# Patient Record
Sex: Female | Born: 1945 | Race: Black or African American | Hispanic: No | Marital: Married | Smoking: Never smoker
Health system: Southern US, Community
[De-identification: ages and names within clinical notes are randomized; demographics above are authoritative.]

## PROBLEM LIST (undated history)

## (undated) DIAGNOSIS — I313 Pericardial effusion (noninflammatory): Secondary | ICD-10-CM

## (undated) DIAGNOSIS — I428 Other cardiomyopathies: Secondary | ICD-10-CM

## (undated) DIAGNOSIS — I5022 Chronic systolic (congestive) heart failure: Secondary | ICD-10-CM

## (undated) DIAGNOSIS — D649 Anemia, unspecified: Secondary | ICD-10-CM

## (undated) DIAGNOSIS — Z5189 Encounter for other specified aftercare: Secondary | ICD-10-CM

## (undated) DIAGNOSIS — Z9119 Patient's noncompliance with other medical treatment and regimen: Secondary | ICD-10-CM

## (undated) DIAGNOSIS — Z91199 Patient's noncompliance with other medical treatment and regimen due to unspecified reason: Secondary | ICD-10-CM

## (undated) DIAGNOSIS — R7881 Bacteremia: Secondary | ICD-10-CM

## (undated) DIAGNOSIS — R55 Syncope and collapse: Secondary | ICD-10-CM

## (undated) DIAGNOSIS — I513 Intracardiac thrombosis, not elsewhere classified: Secondary | ICD-10-CM

## (undated) DIAGNOSIS — I480 Paroxysmal atrial fibrillation: Secondary | ICD-10-CM

## (undated) DIAGNOSIS — I10 Essential (primary) hypertension: Secondary | ICD-10-CM

## (undated) DIAGNOSIS — I3139 Other pericardial effusion (noninflammatory): Secondary | ICD-10-CM

## (undated) DIAGNOSIS — M4640 Discitis, unspecified, site unspecified: Secondary | ICD-10-CM

## (undated) HISTORY — DX: Encounter for other specified aftercare: Z51.89

---

## 1997-10-01 ENCOUNTER — Emergency Department (HOSPITAL_COMMUNITY): Admission: EM | Admit: 1997-10-01 | Discharge: 1997-10-01 | Payer: Self-pay | Admitting: Emergency Medicine

## 1998-05-06 ENCOUNTER — Inpatient Hospital Stay (HOSPITAL_COMMUNITY): Admission: EM | Admit: 1998-05-06 | Discharge: 1998-05-10 | Payer: Self-pay | Admitting: Emergency Medicine

## 1998-05-09 ENCOUNTER — Encounter: Payer: Self-pay | Admitting: *Deleted

## 1998-05-25 ENCOUNTER — Other Ambulatory Visit: Admission: RE | Admit: 1998-05-25 | Discharge: 1998-05-25 | Payer: Self-pay | Admitting: Obstetrics

## 1998-06-09 ENCOUNTER — Ambulatory Visit (HOSPITAL_COMMUNITY): Admission: RE | Admit: 1998-06-09 | Discharge: 1998-06-09 | Payer: Self-pay | Admitting: Obstetrics

## 1998-10-03 ENCOUNTER — Other Ambulatory Visit: Admission: RE | Admit: 1998-10-03 | Discharge: 1998-10-03 | Payer: Self-pay | Admitting: Obstetrics

## 2000-10-04 ENCOUNTER — Emergency Department (HOSPITAL_COMMUNITY): Admission: EM | Admit: 2000-10-04 | Discharge: 2000-10-04 | Payer: Self-pay

## 2005-04-17 ENCOUNTER — Emergency Department (HOSPITAL_COMMUNITY): Admission: EM | Admit: 2005-04-17 | Discharge: 2005-04-17 | Payer: Self-pay | Admitting: Emergency Medicine

## 2006-04-02 ENCOUNTER — Ambulatory Visit: Payer: Self-pay | Admitting: Internal Medicine

## 2011-02-16 ENCOUNTER — Encounter: Payer: Self-pay | Admitting: Emergency Medicine

## 2011-02-16 ENCOUNTER — Emergency Department (HOSPITAL_COMMUNITY)
Admission: EM | Admit: 2011-02-16 | Discharge: 2011-02-17 | Disposition: A | Discharge status: Home / Self Care | Attending: Emergency Medicine | Admitting: Emergency Medicine

## 2011-02-16 DIAGNOSIS — L2989 Other pruritus: Secondary | ICD-10-CM | POA: Insufficient documentation

## 2011-02-16 DIAGNOSIS — M79609 Pain in unspecified limb: Secondary | ICD-10-CM | POA: Insufficient documentation

## 2011-02-16 DIAGNOSIS — R609 Edema, unspecified: Secondary | ICD-10-CM | POA: Insufficient documentation

## 2011-02-16 DIAGNOSIS — R209 Unspecified disturbances of skin sensation: Secondary | ICD-10-CM | POA: Insufficient documentation

## 2011-02-16 DIAGNOSIS — L298 Other pruritus: Secondary | ICD-10-CM | POA: Insufficient documentation

## 2011-02-16 DIAGNOSIS — R21 Rash and other nonspecific skin eruption: Secondary | ICD-10-CM | POA: Insufficient documentation

## 2011-02-16 DIAGNOSIS — I10 Essential (primary) hypertension: Secondary | ICD-10-CM | POA: Insufficient documentation

## 2011-02-16 DIAGNOSIS — Z86718 Personal history of other venous thrombosis and embolism: Secondary | ICD-10-CM | POA: Insufficient documentation

## 2011-02-16 HISTORY — DX: Essential (primary) hypertension: I10

## 2011-02-16 HISTORY — DX: Anemia, unspecified: D64.9

## 2011-02-16 MED ORDER — DIPHENHYDRAMINE HCL 50 MG/ML IJ SOLN: 25.0000 mg | Freq: Once | INTRAMUSCULAR | Status: DC

## 2011-02-16 NOTE — ED Notes (Signed)
PT. REPORTS BILATERAL ANKLE AND LOWER LEG  SWELLING WITH PAIN AND TINGLING X 1 MONTH , DENIES INJJURY OR FALL , NO FEVER OR CHILLS.

## 2011-02-16 NOTE — ED Provider Notes (Signed)
History     CSN: [id number] Arrival date & time: 02/16/2011  8:09 PM   First MD Initiated Contact with Patient 02/16/11 2200      Chief Complaint  Patient presents with  . Joint Swelling    (Consider location/radiation/quality/duration/timing/severity/associated sxs/prior treatment) HPI History provided by pt.   Patient referred by [facility] today for bilateral LE pain, edema, pruritis and tingling x 4-5 months.  Legs are very irritated when she steps out of shower and cool air hits them.  Sx have gradually worsened and are worse on L.  In the past 2 weeks, her skin color has changed as well.  Occasionally weeps clear fluid.  Has had similar skin changes on arms but this has cleared up.  Denies fever.  Denies trauma.  Denies CP/SOB.  No prior cardiac history.  H/o DVT at age 65 during pregnancy.  Pt reports that current sx are different.  H/o mild psoriasis.   Past Medical History  Diagnosis Date  . Anemia   . Hypertension     History reviewed. No pertinent past surgical history.  No family history on file.  History  Substance Use Topics  . Smoking status: Never Smoker   . Smokeless tobacco: Not on file  . Alcohol Use: No    OB History    Grav Para Term Preterm Abortions TAB SAB Ect Mult Living                  Review of Systems  All other systems reviewed and are negative.    Allergies  Review of patient's allergies indicates no known allergies.  Home Medications   Current Outpatient Rx  Name Route Sig Dispense Refill  . FERROUS SULFATE 325 (65 FE) MG PO TABS Oral Take 325 mg by mouth daily with breakfast.        BP 170/97  Pulse 80  Temp(Src) 98 F (36.7 C) (Oral)  Resp 18  SpO2 100%  Physical Exam  Nursing note and vitals reviewed. Constitutional: She is oriented to person, place, and time. She appears well-developed and well-nourished. No distress.  HENT:  Head: Normocephalic and atraumatic.       Nml buccal mucosa  Eyes:       Normal  appearance  Neck: Normal range of motion.  Cardiovascular: Normal rate and regular rhythm.   Pulmonary/Chest: Effort normal and breath sounds normal.  Musculoskeletal:       Mild non-pitting edema bilateral LE; slightly worse on left.  Full ROM w/out pain.  2+ radial pulses and distal sensation intact.  Ambulates normally.  Neurological: She is alert and oriented to person, place, and time.  Skin: Skin is warm and dry.       Bilateral LE w/ diffuse, circumferential erythema overlayed w/ hyperpigmented and silvery scales.  Extends into left dorsal foot.  No drainage.  Skin feels leathery.  Tissues are soft.  Mild tenderness bilateral calves only.  Small areas of hyperpigmented skin on arms, back and abd as well.  Palms and soles spared.  Psychiatric: She has a normal mood and affect. Her behavior is normal.    ED Course  Procedures (including critical care time)  Labs Reviewed  BASIC METABOLIC PANEL - Abnormal; Notable for the following:    Glucose, Bld 108 (*)    All other components within normal limits  CBC  LAB REPORT - SCANNED   No results found.   1. Rash       MDM  Pt presents w/  painful/pruritis rash of bilateral LE for the past 4-5 weeks.  Dr. Rubin Payor and Dierdre Highman have both evaluated patient and it appears to be a severe case of psoriasis.  Pt has a prior history.  Discharged home w/ prednisone and referral to [facility].  Recommended cool compresses and benadryl and advised against scratching.  Return precautions discussed.        Otilio Miu, [place] 02/17/11 1143

## 2011-02-17 LAB — BASIC METABOLIC PANEL
BUN: 9 mg/dL (ref 6–23)
CO2: 25 mEq/L (ref 19–32)
Calcium: 9.8 mg/dL (ref 8.4–10.5)
Chloride: 102 mEq/L (ref 96–112)
Creatinine, Ser: 0.62 mg/dL (ref 0.50–1.10)
GFR calc Af Amer: >90 mL/min (ref >90)
GFR calc non Af Amer: >90 mL/min (ref >90)
Glucose, Bld: 108 mg/dL — ABNORMAL HIGH (ref 70–99)
Potassium: 4.3 mEq/L (ref 3.5–5.1)
Sodium: 139 mEq/L (ref 135–145)

## 2011-02-17 LAB — CBC
HCT: 38.6 % (ref 36.0–46.0)
Hemoglobin: 12.3 g/dL (ref 12.0–15.0)
MCH: 27.4 pg (ref 26.0–34.0)
MCHC: 31.9 g/dL (ref 30.0–36.0)
MCV: 86 fL (ref 78.0–100.0)
Platelets: 287 10*3/uL (ref 150–400)
RBC: 4.49 MIL/uL (ref 3.87–5.11)
RDW: 13.7 % (ref 11.5–15.5)
WBC: 7.2 10*3/uL (ref 4.0–10.5)

## 2011-02-17 MED ORDER — PREDNISONE 10 MG PO TABS: 20.0000 mg | ORAL_TABLET | Freq: Two times a day (BID) | ORAL | Status: AC

## 2011-02-17 MED ORDER — DIPHENHYDRAMINE HCL 25 MG PO CAPS
25.0000 mg | ORAL_CAPSULE | Freq: Once | ORAL | Status: AC
  Administered 2011-02-17: 25 mg via ORAL
  Filled 2011-02-17: qty 1

## 2011-02-17 NOTE — ED Provider Notes (Signed)
Medical screening examination/treatment/procedure(s) were performed by non-physician practitioner and as supervising physician I was immediately available for consultation/collaboration.   Malyk Girouard R. Foster Frericks, MD 02/17/11 2211 

## 2013-12-24 ENCOUNTER — Ambulatory Visit (INDEPENDENT_AMBULATORY_CARE_PROVIDER_SITE_OTHER): Payer: Medicare Other | Admitting: Family Medicine

## 2013-12-24 VITALS — BP 146/88 | HR 62 | Temp 98.0°F | Resp 17 | Ht 62.5 in | Wt 105.0 lb

## 2013-12-24 DIAGNOSIS — L308 Other specified dermatitis: Secondary | ICD-10-CM | POA: Diagnosis not present

## 2013-12-24 DIAGNOSIS — L299 Pruritus, unspecified: Secondary | ICD-10-CM | POA: Diagnosis not present

## 2013-12-24 DIAGNOSIS — R609 Edema, unspecified: Secondary | ICD-10-CM

## 2013-12-24 DIAGNOSIS — R21 Rash and other nonspecific skin eruption: Secondary | ICD-10-CM

## 2013-12-24 LAB — POCT CBC
GRANULOCYTE PERCENT: 76.9 % (ref 37–80)
HEMATOCRIT: 42.1 % (ref 37.7–47.9)
Hemoglobin: 13 g/dL (ref 12.2–16.2)
Lymph, poc: 1.6 (ref 0.6–3.4)
MCH, POC: 27.3 pg (ref 27–31.2)
MCHC: 30.9 g/dL — AB (ref 31.8–35.4)
MCV: 88.2 fL (ref 80–97)
MID (cbc): 0.2 (ref 0–0.9)
MPV: 6.9 fL (ref 0–99.8)
POC GRANULOCYTE: 5.8 (ref 2–6.9)
POC LYMPH %: 21 % (ref 10–50)
POC MID %: 2.1 %M (ref 0–12)
Platelet Count, POC: 283 10*3/uL (ref 142–424)
RBC: 4.77 M/uL (ref 4.04–5.48)
RDW, POC: 16.4 %
WBC: 7.6 10*3/uL (ref 4.6–10.2)

## 2013-12-24 LAB — COMPREHENSIVE METABOLIC PANEL
ALT: 13 U/L (ref 0–35)
AST: 19 U/L (ref 0–37)
Albumin: 4 g/dL (ref 3.5–5.2)
Alkaline Phosphatase: 71 U/L (ref 39–117)
BILIRUBIN TOTAL: 0.7 mg/dL (ref 0.2–1.2)
BUN: 14 mg/dL (ref 6–23)
CALCIUM: 9.5 mg/dL (ref 8.4–10.5)
CHLORIDE: 104 meq/L (ref 96–112)
CO2: 26 mEq/L (ref 19–32)
CREATININE: 0.64 mg/dL (ref 0.50–1.10)
Glucose, Bld: 95 mg/dL (ref 70–99)
Potassium: 4.6 mEq/L (ref 3.5–5.3)
SODIUM: 137 meq/L (ref 135–145)
Total Protein: 7.4 g/dL (ref 6.0–8.3)

## 2013-12-24 LAB — POCT SKIN KOH: SKIN KOH, POC: NEGATIVE

## 2013-12-24 LAB — TSH: TSH: 1.054 u[IU]/mL (ref 0.350–4.500)

## 2013-12-24 MED ORDER — TRIAMCINOLONE ACETONIDE 0.1 % EX LOTN: TOPICAL_LOTION | CUTANEOUS | Status: DC

## 2013-12-24 NOTE — Patient Instructions (Addendum)
You have problems when I am unavailable contact Benny Lennert, PA-C at [facility] [phone]  We will let you know when we get the pathology back so we can decide on best treatment. We might have to send you to a dermatologist, but we will see what it shows first.  Use the triamcinolone cream once or twice daily on the rash.  Take Zyrtec (cetirizine) one at bedtime for itching

## 2013-12-24 NOTE — Progress Notes (Signed)
Procedure:  Consent obtained. Local anesthesia with 1% lido with epi.  64mm punch performed and sent for biopsy.  Drsg placed.

## 2013-12-24 NOTE — Progress Notes (Signed)
Subjective: 68 year old lady who rarely goes to Dr. she is here today complaining of generalized itching for the past 2 years. She has a rash pretty much all over her. Especially on her legs. She says her legs and now gotten to where the skin is discolored. She uses various OTC creams on them without relief. She itches terribly. Has no other major complaints.  Objective: Chest clear. Heart regular without murmur. She has a generalized rash on her trunk and legs especially. A little on the upper arms. Face is spared. The legs look like a chronic eczematoid dermatitis with thickened skin.  Assessment: Generalized itching Generalized nonspecific dermatitis  Plan: Check general chemistries to make sure nothing is out of line. Get a skin scraping. Check TSH.  Results for orders placed in visit on 12/24/13  POCT CBC      Result Value Ref Range   WBC 7.6  4.6 - 10.2 K/uL   Lymph, poc 1.6  0.6 - 3.4   POC LYMPH PERCENT 21.0  10 - 50 %L   MID (cbc) 0.2  0 - 0.9   POC MID % 2.1  0 - 12 %M   POC Granulocyte 5.8  2 - 6.9   Granulocyte percent 76.9  37 - 80 %G   RBC 4.77  4.04 - 5.48 M/uL   Hemoglobin 13.0  12.2 - 16.2 g/dL   HCT, POC 34.0  37.0 - 47.9 %   MCV 88.2  80 - 97 fL   MCH, POC 27.3  27 - 31.2 pg   MCHC 30.9 (*) 31.8 - 35.4 g/dL   RDW, POC 96.4     Platelet Count, POC 283  142 - 424 K/uL   MPV 6.9  0 - 99.8 fL  POCT SKIN KOH      Result Value Ref Range   Skin KOH, POC Negative     PA will do punch biopsy

## 2013-12-25 ENCOUNTER — Encounter: Payer: Self-pay | Admitting: *Deleted

## 2014-01-26 ENCOUNTER — Ambulatory Visit: Admitting: Family

## 2014-07-20 ENCOUNTER — Inpatient Hospital Stay (HOSPITAL_COMMUNITY)
Admission: EM | Admit: 2014-07-20 | Discharge: 2014-07-30 | DRG: 271 | Disposition: A | Discharge status: Home Health | Payer: Medicare Other | Attending: Cardiovascular Disease | Admitting: Cardiovascular Disease

## 2014-07-20 ENCOUNTER — Encounter (HOSPITAL_COMMUNITY): Payer: Self-pay | Admitting: *Deleted

## 2014-07-20 ENCOUNTER — Emergency Department (HOSPITAL_COMMUNITY): Payer: Medicare Other

## 2014-07-20 DIAGNOSIS — I110 Hypertensive heart disease with heart failure: Secondary | ICD-10-CM

## 2014-07-20 DIAGNOSIS — R0600 Dyspnea, unspecified: Secondary | ICD-10-CM

## 2014-07-20 DIAGNOSIS — J449 Chronic obstructive pulmonary disease, unspecified: Secondary | ICD-10-CM | POA: Diagnosis not present

## 2014-07-20 DIAGNOSIS — Z22322 Carrier or suspected carrier of Methicillin resistant Staphylococcus aureus: Secondary | ICD-10-CM | POA: Diagnosis not present

## 2014-07-20 DIAGNOSIS — I3139 Other pericardial effusion (noninflammatory): Secondary | ICD-10-CM

## 2014-07-20 DIAGNOSIS — R0609 Other forms of dyspnea: Secondary | ICD-10-CM

## 2014-07-20 DIAGNOSIS — Z452 Encounter for adjustment and management of vascular access device: Secondary | ICD-10-CM | POA: Diagnosis not present

## 2014-07-20 DIAGNOSIS — R609 Edema, unspecified: Secondary | ICD-10-CM | POA: Diagnosis not present

## 2014-07-20 DIAGNOSIS — I501 Left ventricular failure: Secondary | ICD-10-CM | POA: Diagnosis not present

## 2014-07-20 DIAGNOSIS — I319 Disease of pericardium, unspecified: Secondary | ICD-10-CM | POA: Diagnosis not present

## 2014-07-20 DIAGNOSIS — I428 Other cardiomyopathies: Secondary | ICD-10-CM | POA: Diagnosis present

## 2014-07-20 DIAGNOSIS — I447 Left bundle-branch block, unspecified: Secondary | ICD-10-CM | POA: Diagnosis not present

## 2014-07-20 DIAGNOSIS — I309 Acute pericarditis, unspecified: Secondary | ICD-10-CM | POA: Diagnosis not present

## 2014-07-20 DIAGNOSIS — Z01810 Encounter for preprocedural cardiovascular examination: Secondary | ICD-10-CM

## 2014-07-20 DIAGNOSIS — I10 Essential (primary) hypertension: Secondary | ICD-10-CM

## 2014-07-20 DIAGNOSIS — R600 Localized edema: Secondary | ICD-10-CM

## 2014-07-20 DIAGNOSIS — D649 Anemia, unspecified: Secondary | ICD-10-CM | POA: Diagnosis present

## 2014-07-20 DIAGNOSIS — I313 Pericardial effusion (noninflammatory): Secondary | ICD-10-CM | POA: Diagnosis present

## 2014-07-20 DIAGNOSIS — I5023 Acute on chronic systolic (congestive) heart failure: Secondary | ICD-10-CM | POA: Diagnosis not present

## 2014-07-20 DIAGNOSIS — I509 Heart failure, unspecified: Secondary | ICD-10-CM | POA: Diagnosis not present

## 2014-07-20 DIAGNOSIS — R0601 Orthopnea: Secondary | ICD-10-CM | POA: Diagnosis not present

## 2014-07-20 DIAGNOSIS — J90 Pleural effusion, not elsewhere classified: Secondary | ICD-10-CM | POA: Diagnosis not present

## 2014-07-20 DIAGNOSIS — I480 Paroxysmal atrial fibrillation: Secondary | ICD-10-CM | POA: Diagnosis present

## 2014-07-20 DIAGNOSIS — J811 Chronic pulmonary edema: Secondary | ICD-10-CM | POA: Diagnosis not present

## 2014-07-20 DIAGNOSIS — I898 Other specified noninfective disorders of lymphatic vessels and lymph nodes: Secondary | ICD-10-CM | POA: Diagnosis not present

## 2014-07-20 DIAGNOSIS — I429 Cardiomyopathy, unspecified: Secondary | ICD-10-CM | POA: Diagnosis present

## 2014-07-20 DIAGNOSIS — R140 Abdominal distension (gaseous): Secondary | ICD-10-CM

## 2014-07-20 DIAGNOSIS — I5021 Acute systolic (congestive) heart failure: Secondary | ICD-10-CM | POA: Diagnosis not present

## 2014-07-20 DIAGNOSIS — I517 Cardiomegaly: Secondary | ICD-10-CM | POA: Diagnosis not present

## 2014-07-20 HISTORY — DX: Pericardial effusion (noninflammatory): I31.3

## 2014-07-20 HISTORY — DX: Chronic systolic (congestive) heart failure: I50.22

## 2014-07-20 HISTORY — DX: Other cardiomyopathies: I42.8

## 2014-07-20 HISTORY — DX: Other pericardial effusion (noninflammatory): I31.39

## 2014-07-20 LAB — I-STAT TROPONIN, ED: Troponin i, poc: 0 ng/mL (ref 0.00–0.08)

## 2014-07-20 LAB — MAGNESIUM: Magnesium: 1.8 mg/dL (ref 1.5–2.5)

## 2014-07-20 LAB — BASIC METABOLIC PANEL
ANION GAP: 11 (ref 5–15)
BUN: 17 mg/dL (ref 6–23)
CALCIUM: 8.6 mg/dL (ref 8.4–10.5)
CHLORIDE: 109 mmol/L (ref 96–112)
CO2: 21 mmol/L (ref 19–32)
Creatinine, Ser: 0.94 mg/dL (ref 0.50–1.10)
GFR, EST AFRICAN AMERICAN: 71 mL/min — AB (ref >90)
GFR, EST NON AFRICAN AMERICAN: 61 mL/min — AB (ref >90)
Glucose, Bld: 102 mg/dL — ABNORMAL HIGH (ref 70–99)
Potassium: 4.4 mmol/L (ref 3.5–5.1)
Sodium: 141 mmol/L (ref 135–145)

## 2014-07-20 LAB — CBC
HCT: 38.7 % (ref 36.0–46.0)
HEMOGLOBIN: 12.3 g/dL (ref 12.0–15.0)
MCH: 27.5 pg (ref 26.0–34.0)
MCHC: 31.8 g/dL (ref 30.0–36.0)
MCV: 86.4 fL (ref 78.0–100.0)
PLATELETS: 244 10*3/uL (ref 150–400)
RBC: 4.48 MIL/uL (ref 3.87–5.11)
RDW: 15.2 % (ref 11.5–15.5)
WBC: 7.6 10*3/uL (ref 4.0–10.5)

## 2014-07-20 LAB — TSH: TSH: 1.679 u[IU]/mL (ref 0.350–4.500)

## 2014-07-20 LAB — TROPONIN I: Troponin I: <0.03 ng/mL (ref <0.031)

## 2014-07-20 LAB — BRAIN NATRIURETIC PEPTIDE: B NATRIURETIC PEPTIDE 5: 3400.4 pg/mL — AB (ref 0.0–100.0)

## 2014-07-20 MED ORDER — FUROSEMIDE 10 MG/ML IJ SOLN
40.0000 mg | Freq: Once | INTRAMUSCULAR | Status: AC
  Administered 2014-07-20: 40 mg via INTRAVENOUS
  Filled 2014-07-20: qty 4

## 2014-07-20 MED ORDER — SODIUM CHLORIDE 0.9 % IJ SOLN: 3.0000 mL | INTRAMUSCULAR | Status: DC | PRN

## 2014-07-20 MED ORDER — HEPARIN SODIUM (PORCINE) 5000 UNIT/ML IJ SOLN
5000.0000 [IU] | Freq: Three times a day (TID) | INTRAMUSCULAR | Status: DC
  Administered 2014-07-20 – 2014-07-22 (×7): 5000 [IU] via SUBCUTANEOUS
  Filled 2014-07-20 (×9): qty 1

## 2014-07-20 MED ORDER — SPIRONOLACTONE 12.5 MG HALF TABLET
12.5000 mg | ORAL_TABLET | Freq: Every day | ORAL | Status: DC
  Administered 2014-07-20: 12.5 mg via ORAL
  Filled 2014-07-20 (×2): qty 1

## 2014-07-20 MED ORDER — SODIUM CHLORIDE 0.9 % IV SOLN: 250.0000 mL | INTRAVENOUS | Status: DC | PRN

## 2014-07-20 MED ORDER — LISINOPRIL 2.5 MG PO TABS
2.5000 mg | ORAL_TABLET | Freq: Every day | ORAL | Status: DC
Start: 2014-07-20 — End: 2014-07-21
  Administered 2014-07-20: 2.5 mg via ORAL
  Filled 2014-07-20 (×2): qty 1

## 2014-07-20 MED ORDER — SODIUM CHLORIDE 0.9 % IJ SOLN
3.0000 mL | Freq: Two times a day (BID) | INTRAMUSCULAR | Status: DC
  Administered 2014-07-20 – 2014-07-25 (×9): 3 mL via INTRAVENOUS

## 2014-07-20 MED ORDER — ASPIRIN EC 81 MG PO TBEC
81.0000 mg | DELAYED_RELEASE_TABLET | Freq: Every day | ORAL | Status: DC
  Administered 2014-07-20 – 2014-07-28 (×8): 81 mg via ORAL
  Filled 2014-07-20 (×10): qty 1

## 2014-07-20 MED ORDER — ACETAMINOPHEN 325 MG PO TABS: 650.0000 mg | ORAL_TABLET | ORAL | Status: DC | PRN

## 2014-07-20 MED ORDER — FUROSEMIDE 10 MG/ML IJ SOLN
80.0000 mg | Freq: Two times a day (BID) | INTRAMUSCULAR | Status: DC
  Administered 2014-07-20 – 2014-07-23 (×6): 80 mg via INTRAVENOUS
  Filled 2014-07-20 (×7): qty 8

## 2014-07-20 MED ORDER — ONDANSETRON HCL 4 MG/2ML IJ SOLN: 4.0000 mg | Freq: Four times a day (QID) | INTRAMUSCULAR | Status: DC | PRN

## 2014-07-20 NOTE — H&P (Signed)
Patient ID: Katrina Peck MRN: [id number], DOB/AGE: 25-Jun-1945   Admit date: 07/20/2014   Primary Physician: Loreen Freud, MD Primary Cardiologist: new - will likely f/u in CHF clinic.  Pt. Profile:  69 y/o female w/o prior cardiac hx who presented to the ED today with dyspnea.  Problem List  Past Medical History  Diagnosis Date  . Anemia   . Hypertension   . Blood transfusion without reported diagnosis     History reviewed. No pertinent past surgical history.   Allergies  No Known Allergies  HPI  69 y/o female without prior cardiac hx.  She has a h/o HTN and was on medication at some point but hasn't been on anything in several years.  She has not seen a physician in years.  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  She denies any h/o chest pain.  Due to progression of Ss, her husband convinced her to establish care with Dr. Duanne Guess.  She saw Dr. Duanne Guess in clinic as a new pt today and was referred to the ED for further eval 2/2 concern for CHF and respiratory failure.  Here, pBNP is elevated @ 3400 and CXR shows CHF.  ECG notable for LBBB (? Chronicity).  Initial troponin is nl.  She is dyspneic @ rest and orthopneic with the HOB up anything less than 90 degrees.  Home Medications  Prior to Admission medications   Medication Sig Start Date End Date Taking? Authorizing Provider  triamcinolone lotion (KENALOG) 0.1 % Apply sparsely twice daily to rash 12/24/13   Peyton Najjar, MD    Family History  Family History  Problem Relation Age of Onset  . Hypertension Sister   . Diabetes Maternal Grandmother   . Cancer Mother     died young (pt was only in [employer] @ the time)  . Other Father     died in his 19's.    Social History  History   Social History  . Marital Status: Married    Spouse Name:  N/A  . Number of Children: N/A  . Years of Education: N/A   Occupational History  . Not on file.   Social History Main Topics  . Smoking status: Never Smoker   . Smokeless tobacco: Not on file  . Alcohol Use: No  . Drug Use: No  . Sexual Activity: No   Other Topics Concern  . Not on file   Social History Narrative   Lives in [place] with husband.  Not currently working.     Review of Systems General:  No chills, fever, night sweats or weight changes.  Cardiovascular:  No chest pain, +++ dyspnea on exertion, +++ edema, +++ orthopnea, no palpitations, +++ paroxysmal nocturnal dyspnea. Dermatological: No rash, lesions/masses Respiratory: No cough, +++ dyspnea Urologic: No hematuria, dysuria Abdominal:   No nausea, vomiting, diarrhea, bright red blood per rectum, melena, or hematemesis. +++ abd bloating and anorexia. Neurologic:  No visual changes, wkns, changes in mental status. All other systems reviewed and are otherwise negative except as noted above.  Physical Exam  Blood pressure 144/93, pulse 107, temperature 97.5 F (36.4 C), temperature source Oral, resp. rate 19, weight 120 lb (54.432 kg), SpO2 98 %.  General: Pleasant, dyspneic with minimal activity. Psych: Normal affect. Neuro: Alert and oriented X 3.  Moves all extremities spontaneously. HEENT: Normal  Neck: Supple without bruits.  JVP to jaw. Lungs:  Resp regular and unlabored, bibasilar crackles. Heart: RRR + s3, no murmurs. Abdomen: firm, protuberant, diffusely tender, BS + x 4. No flank edema. Extremities: No clubbing, cyanosis.  3+ bilat LE woody edema to knees/tender to touch. DP/PT/Radials 2+ and equal bilaterally.  Labs  Troponin [facility] of Care Test)  Recent Labs  07/20/14 1647  TROPIPOC 0.00   Lab Results  Component Value Date   WBC 7.6 07/20/2014   HGB 12.3 07/20/2014   HCT 38.7 07/20/2014   MCV 86.4 07/20/2014   PLT 244 07/20/2014     Recent Labs Lab 07/20/14 1633  NA 141  K 4.4  CL  109  CO2 21  BUN 17  CREATININE 0.94  CALCIUM 8.6  GLUCOSE 102*   Radiology/Studies  Dg Chest 2 View  07/20/2014   CLINICAL DATA:  Short  of breath and weakness for 2 weeks  EXAM: CHEST  2 VIEW  COMPARISON:  None.  FINDINGS: There is moderate cardiac enlargement. Bilateral pleural effusions and interstitial edema is identified. Decreased aeration to lung bases likely represents atelectasis. Mild degenerative disc disease within the thoracic spine.  IMPRESSION: 1. Cardiac enlargement and CHF.   Electronically Signed   By: Signa Kell M.D.   On: 07/20/2014 19:01   ECG  Rsr, 85, lbbb, poor r prog, inflat twi - no old for comparison.  ASSESSMENT AND PLAN  1.  Acute CHF:  Suspect low output CHF with a 4 month h/o progressive DOE, pnd, orthopnea, anorexia, wt gain, and edema.  No prior h/o CAD/CHF or chest pain.  Marked volume overload on exam with S3 present.  Admit to tele.  Cycle CE.  Check echo, tsh.  Add lasix 80 IV bid, spiro 12.5 daily, and low dose acei.  No bb @ present in setting of acute CHF and presumed low-output.  Could add digoxin if EF found reduced, which I suspect it will be.  If LV dysfxn present, will need cath to r/o obstructive CAD once she is able to lie flat.  2.  HTN:  Follow with diuresis.  Add acei.  Signed, Nicolasa Ducking, NP 07/20/2014, 8:17 PM  The patient was seen and examined, and I agree with the assessment and plan as documented above, with modifications as noted below. Very pleasant 69 yr old woman who normally avoids seeing healthcare providers who established with Dr. Duanne Guess today and presents with aforementioned history (progressive exertional dyspnea, orthopnea, lower extremity edema, abdominal distention, weight gain, and paroxysmal nocturnal dyspnea) found to be in acute CHF (unclear type at this point, but suspect systolic with probable nonischemic cardiomyopathy). Has underlying left bundle branch block of uncertain duration. Will order  echocardiogram and treat with IV Lasix, spironolactone 12.5 mg, and lisinopril. Will not start beta blocker in acute decompensated setting. Consider digoxin after EF assessment. Will likely need cardiac catheterization for definitive assessment of etiology. Pt and husband agreeable with plan.

## 2014-07-20 NOTE — Progress Notes (Signed)
Received pt report from Christina,RN-ED.

## 2014-07-20 NOTE — ED Provider Notes (Signed)
CSN: [id number]     Arrival date & time 07/20/14  1611 History   First MD Initiated Contact with Patient 07/20/14 1849     Chief Complaint  Patient presents with  . Shortness of Breath     (Consider location/radiation/quality/duration/timing/severity/associated sxs/prior Treatment) Patient is a 69 y.o. female presenting with shortness of breath. The history is provided by the patient and the spouse. No language interpreter was used.  Shortness of Breath Associated symptoms: no abdominal pain, no cough, no fever and no rash   Katrina Peck is a 69 y.o female with a history of HTN and anemia who presents today new onset shortness of breath that began 2 weeks ago and has been progressively worse. She states she is 120 pounds and more than she has ever weighed in her life.  Her legs have also been swollen.  She was sent here today by her pcp for new onset CHF. She states this all started after she was treated for a rash on her arms and given triamcinolone cream.  She denies any chest pain, abdominal pain, nausea, or vomiting.  Past Medical History  Diagnosis Date  . Anemia   . Hypertension   . Blood transfusion without reported diagnosis    History reviewed. No pertinent past surgical history. Family History  Problem Relation Age of Onset  . Hypertension Sister   . Diabetes Maternal Grandmother   . Cancer Mother     died young (pt was only in [employer] @ the time)  . Other Father     died in his 24's.   History  Substance Use Topics  . Smoking status: Never Smoker   . Smokeless tobacco: Not on file  . Alcohol Use: No   OB History    No data available     Review of Systems  Constitutional: Negative for fever.  Respiratory: Positive for shortness of breath. Negative for cough.   Cardiovascular: Positive for leg swelling. Negative for palpitations.  Gastrointestinal: Negative for abdominal pain.  Endocrine: Positive for polydipsia.  Skin: Negative for rash.  Neurological:  Positive for weakness. Negative for dizziness, syncope and light-headedness.  All other systems reviewed and are negative.     Allergies  Review of patient's allergies indicates no known allergies.  Home Medications   Prior to Admission medications   Medication Sig Start Date End Date Taking? Authorizing Provider  triamcinolone lotion (KENALOG) 0.1 % Apply sparsely twice daily to rash 12/24/13  Yes Peyton Najjar, MD   BP 147/93 mmHg  Pulse 84  Temp(Src) 97.5 F (36.4 C) (Oral)  Resp 22  Ht 5\' 3"  (1.6 m)  Wt 113 lb 11.2 oz (51.574 kg)  BMI 20.15 kg/m2  SpO2 100% Physical Exam  Constitutional: She is oriented to person, place, and time. She appears well-developed and well-nourished.  HENT:  Head: Normocephalic and atraumatic.  Eyes: Conjunctivae are normal.  Neck: Normal range of motion. Neck supple.  Cardiovascular: Normal rate, regular rhythm and normal heart sounds.   Pulmonary/Chest: Accessory muscle usage present. She is in respiratory distress. She has rales in the right middle field, the right lower field and the left lower field.  Abdominal: Soft. There is no tenderness.  Musculoskeletal: Normal range of motion.  Bilateral lower extremity edema.   Neurological: She is alert and oriented to person, place, and time.  Skin: Skin is warm and dry.  Nursing note and vitals reviewed.   ED Course  Procedures (including critical care time) Labs Review Labs  Reviewed  BASIC METABOLIC PANEL - Abnormal; Notable for the following:    Glucose, Bld 102 (*)    GFR calc non Af Amer 61 (*)    GFR calc Af Amer 71 (*)    All other components within normal limits  BRAIN NATRIURETIC PEPTIDE - Abnormal; Notable for the following:    B Natriuretic Peptide 3400.4 (*)    All other components within normal limits  CBC  MAGNESIUM  TSH  TROPONIN I  BASIC METABOLIC PANEL  TROPONIN I  TROPONIN I  I-STAT TROPOININ, ED    Imaging Review Dg Chest 2 View  07/20/2014   CLINICAL DATA:   Short  of breath and weakness for 2 weeks  EXAM: CHEST  2 VIEW  COMPARISON:  None.  FINDINGS: There is moderate cardiac enlargement. Bilateral pleural effusions and interstitial edema is identified. Decreased aeration to lung bases likely represents atelectasis. Mild degenerative disc disease within the thoracic spine.  IMPRESSION: 1. Cardiac enlargement and CHF.   Electronically Signed   By: Signa Kell M.D.   On: 07/20/2014 19:01     EKG Interpretation   Date/Time:  Tuesday July 20 2014 16:26:44 EDT Ventricular Rate:  85 PR Interval:  130 QRS Duration: 130 QT Interval:  446 QTC Calculation: 530 R Axis:   116 Text Interpretation:  Sinus rhythm with occasional Premature ventricular  complexes Non-specific intra-ventricular conduction block Anterolateral  infarct , age undetermined T wave abnormality, consider inferior ischemia  Abnormal ECG new onset LBBB Confirmed by YAO  MD, DAVID ([id number]) on  07/20/2014 7:03:16 PM      MDM   Final diagnoses:  Acute congestive heart failure, unspecified congestive heart failure type   Patient presents for shortness of breath and weight gain for 2 weeks. She was seen by pcp today who sent her to ED.   CBC, BMP, and troponin are unremarkable.  She is hypertensive with new onset CHF with a BNP of 3400.  She has lower extremity edema and pulmonary edema on CXR which correlates with her physical exam. Her vitals are stable. She is non toxic appearing and tachypnic but not tripoding. I have started  IV lasix.   I spoke to Dr. Silverio Lay regarding this patient and he has seen her. 19:43 Dr. Chaney Malling spoke to cardiology who has accepted and will admit.     Catha Gosselin, PA-C 07/21/14 0049  Richardean Canal, MD 07/22/14 [phone]

## 2014-07-20 NOTE — ED Notes (Signed)
Admitting MD at bedside.

## 2014-07-20 NOTE — Progress Notes (Signed)
Katrina Peck [id number] Admission Data: 07/20/2014 11:02 PM Attending Provider: Laqueta Linden, MD PCP:No PCP Per Patient Code Status: Full  Katrina Peck is a 69 y.o. female patient admitted from ED:  -No acute distress noted.  -No complaints of shortness of breath.  -No complaints of chest pain.   Cardiac Monitoring: Box # 6 in place. Cardiac monitor yields:normal sinus rhythm.  Blood pressure 147/93, pulse 84, temperature 97.5 F (36.4 C), temperature source Oral, resp. rate 22, height 5\' 3"  (1.6 m), weight 51.574 kg (113 lb 11.2 oz), SpO2 100 %.   IV Fluids:  IV in place, occlusive dsg intact without redness, IV cath antecubital left, condition patent and no redness none.   Allergies:  Review of patient's allergies indicates no known allergies.  Past Medical History:   has a past medical history of Anemia; Hypertension; and Blood transfusion without reported diagnosis.  Past Surgical History:   has no past surgical history on file.  Social History:   reports that she has never smoked. She does not have any smokeless tobacco history on file. She reports that she does not drink alcohol or use illicit drugs.  Skin: NSI  Patient/Family orientated to room. Information packet given to patient/family. Admission inpatient armband information verified with patient/family to include name and date of birth and placed on patient arm. Side rails up x 2, fall assessment and education completed with patient/family. Patient/family able to verbalize understanding of risk associated with falls and verbalized understanding to call for assistance before getting out of bed. Call light within reach. Patient/family able to voice and demonstrate understanding of unit orientation instructions.

## 2014-07-20 NOTE — ED Notes (Signed)
Pt states that SOB has been ongoing for several months. Pt also reports discoloration to skin. NAD noted.

## 2014-07-20 NOTE — ED Notes (Signed)
Pt continues to get up and use the restroom.

## 2014-07-21 ENCOUNTER — Encounter (HOSPITAL_COMMUNITY): Payer: Self-pay | Admitting: General Practice

## 2014-07-21 DIAGNOSIS — I5021 Acute systolic (congestive) heart failure: Secondary | ICD-10-CM

## 2014-07-21 DIAGNOSIS — I509 Heart failure, unspecified: Secondary | ICD-10-CM

## 2014-07-21 LAB — BASIC METABOLIC PANEL
ANION GAP: 10 (ref 5–15)
BUN: 16 mg/dL (ref 6–23)
CO2: 25 mmol/L (ref 19–32)
Calcium: 8.6 mg/dL (ref 8.4–10.5)
Chloride: 106 mmol/L (ref 96–112)
Creatinine, Ser: 1.04 mg/dL (ref 0.50–1.10)
GFR, EST AFRICAN AMERICAN: 63 mL/min — AB (ref >90)
GFR, EST NON AFRICAN AMERICAN: 54 mL/min — AB (ref >90)
Glucose, Bld: 88 mg/dL (ref 70–99)
POTASSIUM: 3.5 mmol/L (ref 3.5–5.1)
Sodium: 141 mmol/L (ref 135–145)

## 2014-07-21 LAB — TROPONIN I
Troponin I: <0.03 ng/mL (ref <0.031)
Troponin I: <0.03 ng/mL (ref <0.031)

## 2014-07-21 MED ORDER — POTASSIUM CHLORIDE CRYS ER 20 MEQ PO TBCR
40.0000 meq | EXTENDED_RELEASE_TABLET | Freq: Once | ORAL | Status: AC
  Administered 2014-07-21: 40 meq via ORAL
  Filled 2014-07-21: qty 2

## 2014-07-21 MED ORDER — SPIRONOLACTONE 12.5 MG HALF TABLET
12.5000 mg | ORAL_TABLET | Freq: Every day | ORAL | Status: DC
  Administered 2014-07-21: 12.5 mg via ORAL
  Filled 2014-07-21 (×2): qty 1

## 2014-07-21 MED ORDER — SPIRONOLACTONE 25 MG PO TABS: 25.0000 mg | ORAL_TABLET | Freq: Every day | ORAL | Status: DC

## 2014-07-21 MED ORDER — LISINOPRIL 5 MG PO TABS
5.0000 mg | ORAL_TABLET | Freq: Every day | ORAL | Status: DC
  Administered 2014-07-21: 2.5 mg via ORAL
  Administered 2014-07-22: 5 mg via ORAL
  Filled 2014-07-21 (×3): qty 1

## 2014-07-21 MED ORDER — LISINOPRIL 5 MG PO TABS: 5.0000 mg | ORAL_TABLET | Freq: Every day | ORAL | Status: DC

## 2014-07-21 NOTE — Progress Notes (Signed)
\  Echocardiogram 2D Echocardiogram has been performed.  Dorothey Baseman 07/21/2014, 11:14 AM

## 2014-07-21 NOTE — Care Management Note (Addendum)
    Page 1 of 2   07/30/2014     2:58:38 PM CARE MANAGEMENT NOTE 07/30/2014  Patient:  Katrina Peck, Katrina Peck   Account Number:  [id number]  Date Initiated:  07/21/2014  Documentation initiated by:  Anisah Kuck  Subjective/Objective Assessment:   Pt adm on 07/20/14 with CHF exacerbation.  PTA, pt resides at home with spouse and is independent.     Action/Plan:   Will follow for home needs as pt progresses.   Anticipated DC Date:  07/31/2014   Anticipated DC Plan:  HOME W HOME HEALTH SERVICES      DC Planning Services  CM consult  [facility]      [facility] Choice  HOME HEALTH   Choice offered to / List presented to:  C-1 Patient   DME arranged  [facility]      DME agency  [facility]     [facility] arranged  HH-1 RN  HH-10 DISEASE MANAGEMENT  HH-2 PT      HH agency  [facility]   Status of service:  Completed, signed off Medicare Important Message given?  YES (If response is "NO", the following Medicare IM given date fields will be blank) Date Medicare IM given:  07/26/2014 Medicare IM given by:  Junius Creamer Date Additional Medicare IM given:  07/29/2014 Additional Medicare IM given by:  Junius Creamer  Discharge Disposition:  HOME [facility] SERVICES  Per UR Regulation:  Reviewed for med. necessity/level of care/duration of stay  If discussed at Long Length of Stay Meetings, dates discussed:    Comments:  07/30/14 Sidney Ace, RN, BSN [phone] Pt for dc home today with spouse.  Will need HH follow up. Referral to [facility], per pt choice.  Start of care 24-48h post dc date.  PT recommends RW and 3 in 1 for home, and pt agreeable.  Referral to [facility], for DME needs.  DME to be delivered to pt's room prior to dc. Pt to dc home on Eliquis:  pt given free 30 day trial card for first Rx of med. Checked coverage for Eliquis: Per [employer], copay is $109.21, (25% of total cost of med.) Informed pt of this copay, and she states she is okay with it,  as long as the medicine will help her.  Encouraged pt to speak with her cardiologist if she starts having an issue with the copay.

## 2014-07-21 NOTE — Progress Notes (Signed)
Heart Failure Navigator Consult Note  Presentation: Katrina Peck is a 69 y/o female without prior cardiac hx. She has a h/o HTN and was on medication at some point but hasn't been on anything in several years. She has not seen a physician in years. 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 She denies any h/o chest pain. Due to progression of Ss, her husband convinced her to establish care with Dr. Duanne Guess. She saw Dr. Duanne Guess in clinic as a new pt today and was referred to the ED for further eval 2/2 concern for CHF and respiratory failure. Here, pBNP is elevated @ 3400 and CXR shows CHF. ECG notable for LBBB (? Chronicity). Initial troponin is nl. She is dyspneic @ rest and orthopneic with the HOB up anything less than 90 degrees.   Past Medical History  Diagnosis Date  . Anemia   . Hypertension   . Blood transfusion without reported diagnosis     History   Social History  . Marital Status: Married    Spouse Name: N/A  . Number of Children: N/A  . Years of Education: N/A   Social History Main Topics  . Smoking status: Never Smoker   . Smokeless tobacco: Not on file  . Alcohol Use: No  . Drug Use: No  . Sexual Activity: No   Other Topics Concern  . None   Social History Narrative   Lives in [place] with husband.  Not currently working.    ECHO:pending  BNP    Component Value Date/Time   BNP 3400.4* 07/20/2014 1622    ProBNP No results found for: PROBNP   Education Assessment and Provision:  Detailed education and instructions provided on heart failure disease management including the following:  Signs and symptoms of Heart Failure When to call the physician Importance of daily weights Low sodium diet Fluid restriction Medication management Anticipated future follow-up  appointments  Patient education given on each of the above topics.  Patient acknowledges understanding and acceptance of all instructions.  I spoke at length with Ms. Mcbrayer about her new diagnosis of HF.  We discussed the importance of daily weights and how weight gains relate to her HF.  She does not currently have a scale however says that her husband will get one for her.  I discussed a low sodium diet and high sodium foods to avoid.  She does not think that she will have any issue with getting or taking prescribed medications. She is usually active at home and says that she is at [facility] so much her kids joke that she works there.  She asked appropriate questions and seems motivated to make changes necessary for her health.  I plan to return to reinforce education before she is discharged.  Education Materials:  "Living Better With Heart Failure" Booklet, Daily Weight Tracker Tool and Heart Failure Educational Video.   High Risk Criteria for Readmission and/or Poor Patient Outcomes:  (Recommend Follow-up with [facility])--yes   EF <30%-pending  2 or more admissions in 6 months- No-New HF  Difficult social situation- No-lives with husband in [place]  Demonstrates medication noncompliance- No   Barriers of Care:  Knowledge and compliance  Discharge Planning:  Plans to return home with husband

## 2014-07-21 NOTE — Progress Notes (Signed)
Patient ID: Katrina Peck, female   DOB: [birth year], 69 y.o.   MRN: [id number]   Patient with history of HTN was admitted 4/26 with 3 months of exertional dyspnea, weight gain, cough, and orthopnea.    She was started on IV Lasix and diuresed quite well overnight.  BP mildly elevated.  Still short of breath walking to door and orthopneic.   Scheduled Meds: . aspirin EC  81 mg Oral Daily  . furosemide  80 mg Intravenous BID  . heparin  5,000 Units Subcutaneous 3 times per day  . lisinopril  5 mg Oral Daily  . potassium chloride  40 mEq Oral Once  . sodium chloride  3 mL Intravenous Q12H  . spironolactone  12.5 mg Oral Daily   Continuous Infusions:  PRN Meds:.sodium chloride, acetaminophen, ondansetron (ZOFRAN) IV, sodium chloride    Filed Vitals:   07/20/14 1915 07/20/14 2056 07/20/14 2132 07/21/14 0541  BP: 144/93 151/101 147/93 130/79  Pulse: 107 81 84 81  Temp:   97.5 F (36.4 C) 97.6 F (36.4 C)  TempSrc:   Oral Oral  Resp: Height:    (1.6 m)   Weight:   113 lb 11.2 oz (51.574 kg) 108 lb (48.988 kg)  SpO2: 98% 98% 100% 98%    Intake/Output Summary (Last 24 hours) at 07/21/14 0859 Last data filed at 07/21/14 0859  Gross per 24 hour  Intake    360 ml  Output   3450 ml  Net  -3090 ml    LABS: Basic Metabolic Panel:  Recent Labs  40/98/11 1633 07/20/14 2221 07/21/14 0419  NA 141  --  141  K 4.4  --  3.5  CL 109  --  106  CO2 21  --  25  GLUCOSE 102*  --  88  BUN 17  --  16  CREATININE 0.94  --  1.04  CALCIUM 8.6  --  8.6  MG  --  1.8  --    Liver Function Tests: No results for input(s): AST, ALT, ALKPHOS, BILITOT, PROT, ALBUMIN in the last 72 hours. No results for input(s): LIPASE, AMYLASE in the last 72 hours. CBC:  Recent Labs  07/20/14 1633  WBC 7.6  HGB 12.3  HCT 38.7  MCV 86.4  PLT 244   Cardiac Enzymes:  Recent Labs  07/20/14 2221 07/21/14 0419  TROPONINI <0.03 <0.03   BNP: Invalid input(s): POCBNP D-Dimer: No  results for input(s): DDIMER in the last 72 hours. Hemoglobin A1C: No results for input(s): HGBA1C in the last 72 hours. Fasting Lipid Panel: No results for input(s): CHOL, HDL, LDLCALC, TRIG, CHOLHDL, LDLDIRECT in the last 72 hours. Thyroid Function Tests:  Recent Labs  07/20/14 2221  TSH 1.679   Anemia Panel: No results for input(s): VITAMINB12, FOLATE, FERRITIN, TIBC, IRON, RETICCTPCT in the last 72 hours.  RADIOLOGY: Dg Chest 2 View  07/20/2014   CLINICAL DATA:  Short  of breath and weakness for 2 weeks  EXAM: CHEST  2 VIEW  COMPARISON:  None.  FINDINGS: There is moderate cardiac enlargement. Bilateral pleural effusions and interstitial edema is identified. Decreased aeration to lung bases likely represents atelectasis. Mild degenerative disc disease within the thoracic spine.  IMPRESSION: 1. Cardiac enlargement and CHF.   Electronically Signed   By: Signa Kell M.D.   On: 07/20/2014 19:01    PHYSICAL EXAM General: NAD Neck: JVP 14 cm, no thyromegaly or thyroid nodule.  Lungs: Decreased breath  sounds at bases bilaterally.  CV: Lateral PMI.  Heart regular S1/S2, +S3, no murmur.  1+ edema to thighs bilaterally.    Abdomen: Soft, nontender, no hepatosplenomegaly, no distention.  Neurologic: Alert and oriented x 3.  Psych: Normal affect. Extremities: No clubbing or cyanosis.   TELEMETRY: Reviewed telemetry pt in NSR  ASSESSMENT AND PLAN: 69 yo with history of HTN was admitted 4/26 with 3 months of exertional dyspnea, weight gain, cough, and orthopnea.  1. Acute/chronic suspect systolic CHF: Patient was admitted with NYHA class IIIb-IV symptoms and significant volume overload with S3 on exam.  Suspect systolic CHF.  She has a history of untreated HTN.  Symptoms have been present since 1/16.  No viral-type syndrome.  No drugs/ETOH.  No family history of cardiomypathy.  TSH normal.  No chest pain.  She had not had her BP checked recently prior to admission, mildly elevated in the  hospital.  She has diuresed well so far.  She remains quite volume overloaded.  - Continue Lasix 80 mg IV bid, will supplement KCl.  - Start lisinopril 5 mg daily and spironolactone 12.5 mg daily today. Follow BMET.  - Start beta blocker when more euvolemic.  - Echo today.  - When she is more diuresed (?Friday), will need RHC/LHC to assess cardiac output and filling pressures and to rule in/out CAD as cause of cardiomyopathy. If cardiac cath shows no significant disease, would plan eventual cardiac MRI to assess for infiltrative disease.  2. HTN: As above, beginning lisinopril and spironolactone today.   [name] 07/21/2014 9:07 AM

## 2014-07-22 DIAGNOSIS — I319 Disease of pericardium, unspecified: Secondary | ICD-10-CM

## 2014-07-22 LAB — CBC
HCT: 37.1 % (ref 36.0–46.0)
HEMOGLOBIN: 11.9 g/dL — AB (ref 12.0–15.0)
MCH: 27.4 pg (ref 26.0–34.0)
MCHC: 32.1 g/dL (ref 30.0–36.0)
MCV: 85.3 fL (ref 78.0–100.0)
PLATELETS: 240 10*3/uL (ref 150–400)
RBC: 4.35 MIL/uL (ref 3.87–5.11)
RDW: 15.1 % (ref 11.5–15.5)
WBC: 6.2 10*3/uL (ref 4.0–10.5)

## 2014-07-22 LAB — BASIC METABOLIC PANEL
Anion gap: 10 (ref 5–15)
BUN: 14 mg/dL (ref 6–23)
CALCIUM: 8.1 mg/dL — AB (ref 8.4–10.5)
CO2: 28 mmol/L (ref 19–32)
Chloride: 102 mmol/L (ref 96–112)
Creatinine, Ser: 0.94 mg/dL (ref 0.50–1.10)
GFR calc Af Amer: 71 mL/min — ABNORMAL LOW (ref >90)
GFR, EST NON AFRICAN AMERICAN: 61 mL/min — AB (ref >90)
GLUCOSE: 85 mg/dL (ref 70–99)
Potassium: 3.4 mmol/L — ABNORMAL LOW (ref 3.5–5.1)
Sodium: 140 mmol/L (ref 135–145)

## 2014-07-22 LAB — PROTIME-INR
INR: 1.1 (ref 0.00–1.49)
Prothrombin Time: 14.4 seconds (ref 11.6–15.2)

## 2014-07-22 MED ORDER — POTASSIUM CHLORIDE CRYS ER 20 MEQ PO TBCR
40.0000 meq | EXTENDED_RELEASE_TABLET | Freq: Once | ORAL | Status: AC
  Administered 2014-07-22: 40 meq via ORAL
  Filled 2014-07-22: qty 2

## 2014-07-22 MED ORDER — SODIUM CHLORIDE 0.9 % IV SOLN: 250.0000 mL | INTRAVENOUS | Status: DC | PRN

## 2014-07-22 MED ORDER — LISINOPRIL 2.5 MG PO TABS
2.5000 mg | ORAL_TABLET | Freq: Every evening | ORAL | Status: DC
  Administered 2014-07-22: 2.5 mg via ORAL
  Filled 2014-07-22 (×2): qty 1

## 2014-07-22 MED ORDER — SODIUM CHLORIDE 0.9 % IV SOLN: INTRAVENOUS | Status: DC

## 2014-07-22 MED ORDER — SODIUM CHLORIDE 0.9 % IJ SOLN: 3.0000 mL | INTRAMUSCULAR | Status: DC | PRN

## 2014-07-22 MED ORDER — SODIUM CHLORIDE 0.9 % IJ SOLN
3.0000 mL | Freq: Two times a day (BID) | INTRAMUSCULAR | Status: DC
  Administered 2014-07-22 – 2014-07-23 (×2): 3 mL via INTRAVENOUS

## 2014-07-22 MED ORDER — ASPIRIN 81 MG PO CHEW
81.0000 mg | CHEWABLE_TABLET | ORAL | Status: AC
  Administered 2014-07-23: 81 mg via ORAL
  Filled 2014-07-22: qty 1

## 2014-07-22 MED ORDER — SPIRONOLACTONE 25 MG PO TABS
25.0000 mg | ORAL_TABLET | Freq: Every day | ORAL | Status: DC
  Administered 2014-07-22: 25 mg via ORAL
  Filled 2014-07-22 (×3): qty 1

## 2014-07-22 NOTE — Progress Notes (Signed)
Pt's husband says that the patient does not need to have the Cath procedure done tomorrow at noon. He states she needs therapy on her heart before she is to have the procedure done. Consent was signed by the patient earlier today. Will continue to monitor.

## 2014-07-22 NOTE — Progress Notes (Signed)
Patient ID: Katrina Peck, female   DOB: 11-27-1945, 69 y.o.   MRN: [id number]   Patient with history of HTN was admitted 4/26 with 3 months of exertional dyspnea, weight gain, cough, and orthopnea.    She was started on IV Lasix and has diuresed well.  Weight down another 7 lbs.  Creatinine and BP stable.  Still some dyspnea with ambulation.  Echo shows EF 20% with diffuse hypokinesis and mildly decreased RV systolic function.  There is a large pericardial effusion but definite signs of tamponade are not present.  The IVC shows normal respirophasic variation.   Scheduled Meds: . aspirin EC  81 mg Oral Daily  . furosemide  80 mg Intravenous BID  . heparin  5,000 Units Subcutaneous 3 times per day  . lisinopril  2.5 mg Oral QPM  . lisinopril  5 mg Oral Daily  . sodium chloride  3 mL Intravenous Q12H  . spironolactone  25 mg Oral Daily   Continuous Infusions:  PRN Meds:.sodium chloride, acetaminophen, ondansetron (ZOFRAN) IV, sodium chloride    Filed Vitals:   07/21/14 1400 07/21/14 2103 07/22/14 0137 07/22/14 0452  BP: 127/86 123/71 117/75 111/73  Pulse: 89 82 79 71  Temp: 97.4 F (36.3 C) 97.5 F (36.4 C) 97.6 F (36.4 C) 97.3 F (36.3 C)  TempSrc: Oral Oral Oral Oral  Resp: Height:      Weight:    101 lb 3.2 oz (45.904 kg)  SpO2: 95% 100% 97% 100%    Intake/Output Summary (Last 24 hours) at 07/22/14 0738 Last data filed at 07/22/14 0600  Gross per 24 hour  Intake    478 ml  Output   3050 ml  Net  -2572 ml    LABS: Basic Metabolic Panel:  Recent Labs  69/62/95 2221 07/21/14 0419 07/22/14 0410  NA  --  141 140  K  --  3.5 3.4*  CL  --  106 102  CO2  --  25 28  GLUCOSE  --  88 85  BUN  --  16 14  CREATININE  --  1.04 0.94  CALCIUM  --  8.6 8.1*  MG 1.8  --   --    Liver Function Tests: No results for input(s): AST, ALT, ALKPHOS, BILITOT, PROT, ALBUMIN in the last 72 hours. No results for input(s): LIPASE, AMYLASE in the last 72  hours. CBC:  Recent Labs  07/20/14 1633 07/22/14 0410  WBC 7.6 6.2  HGB 12.3 11.9*  HCT 38.7 37.1  MCV 86.4 85.3  PLT 244 240   Cardiac Enzymes:  Recent Labs  07/20/14 2221 07/21/14 0419 07/21/14 0930  TROPONINI <0.03 <0.03 <0.03   BNP: Invalid input(s): POCBNP D-Dimer: No results for input(s): DDIMER in the last 72 hours. Hemoglobin A1C: No results for input(s): HGBA1C in the last 72 hours. Fasting Lipid Panel: No results for input(s): CHOL, HDL, LDLCALC, TRIG, CHOLHDL, LDLDIRECT in the last 72 hours. Thyroid Function Tests:  Recent Labs  07/20/14 2221  TSH 1.679   Anemia Panel: No results for input(s): VITAMINB12, FOLATE, FERRITIN, TIBC, IRON, RETICCTPCT in the last 72 hours.  RADIOLOGY: Dg Chest 2 View  07/20/2014   CLINICAL DATA:  Short  of breath and weakness for 2 weeks  EXAM: CHEST  2 VIEW  COMPARISON:  None.  FINDINGS: There is moderate cardiac enlargement. Bilateral pleural effusions and interstitial edema is identified. Decreased aeration to lung bases likely represents atelectasis. Mild degenerative disc disease  within the thoracic spine.  IMPRESSION: 1. Cardiac enlargement and CHF.   Electronically Signed   By: Katrina Peck M.D.   On: 07/20/2014 19:01    PHYSICAL EXAM General: NAD Neck: JVP 12 cm, no thyromegaly or thyroid nodule.  Lungs: Decreased breath sounds at bases bilaterally.  CV: Lateral PMI.  Heart regular S1/S2, +S3, no murmur.  1+ edema 1/2 to knees bilaterally   Abdomen: Soft, nontender, no hepatosplenomegaly, no distention.  Neurologic: Alert and oriented x 3.  Psych: Normal affect. Extremities: No clubbing or cyanosis.   TELEMETRY: Reviewed telemetry pt in NSR  ASSESSMENT AND PLAN: 69 yo with history of HTN was admitted 4/26 with 3 months of exertional dyspnea, weight gain, cough, and orthopnea.  1. Acute/chronic systolic CHF: Patient was admitted with NYHA class IIIb-IV symptoms and significant volume overload with S3 on exam.   EF 20% on echo with diffuse hypokinesis.  She has a history of untreated HTN.  Symptoms have been present since 1/16.  No viral-type syndrome.  No drugs/ETOH.  No family history of cardiomypathy.  TSH normal.  No chest pain.  She had not had her BP checked recently prior to admission, mildly elevated initially in the hospital.  She has diuresed well so far with weight down considerably.  She remains volume overloaded. - Continue Lasix 80 mg IV bid, will supplement KCl.  - Lisinopril to 5 qam, 2.5 qpm and spironolactone to 25 daily.   - Start beta blocker when more euvolemic.  - Tentatively plan RHC/LHC tomorrow to assess cardiac output and filling pressures and to rule in/out CAD as cause of cardiomyopathy. This will also allow assessment of hemodynamic significance of pericardial effusion.  If cardiac cath shows no significant disease, would plan eventual cardiac MRI to assess for infiltrative disease.  2. HTN: BP stable.  3. Pericardial effusion: Large, but not definitive tamponade by echo.  IVC shows normal respirophasic variation.  BP stable with cardiac meds and diuresis, creatinine ok.  Will assess with RHC/LHC tomorrow, may need pericardiocentesis for diagnostic/therapeutic purposes.   Katrina Peck 07/22/2014 7:38 AM

## 2014-07-23 ENCOUNTER — Encounter (HOSPITAL_COMMUNITY): Payer: Self-pay | Admitting: Cardiology

## 2014-07-23 ENCOUNTER — Encounter (HOSPITAL_COMMUNITY)
Admission: EM | Disposition: A | Discharge status: Home Health | Payer: Self-pay | Source: Home / Self Care | Attending: Cardiovascular Disease

## 2014-07-23 ENCOUNTER — Ambulatory Visit (HOSPITAL_COMMUNITY): Admission: RE | Admit: 2014-07-23 | Payer: Medicare Other | Source: Ambulatory Visit | Admitting: Cardiology

## 2014-07-23 DIAGNOSIS — I429 Cardiomyopathy, unspecified: Secondary | ICD-10-CM

## 2014-07-23 DIAGNOSIS — I5023 Acute on chronic systolic (congestive) heart failure: Principal | ICD-10-CM

## 2014-07-23 HISTORY — PX: LEFT AND RIGHT HEART CATHETERIZATION WITH CORONARY ANGIOGRAM: SHX5449

## 2014-07-23 LAB — BASIC METABOLIC PANEL
ANION GAP: 7 (ref 5–15)
BUN: 17 mg/dL (ref 6–23)
CO2: 31 mmol/L (ref 19–32)
CREATININE: 0.93 mg/dL (ref 0.50–1.10)
Calcium: 8.4 mg/dL (ref 8.4–10.5)
Chloride: 103 mmol/L (ref 96–112)
GFR calc Af Amer: 72 mL/min — ABNORMAL LOW (ref >90)
GFR, EST NON AFRICAN AMERICAN: 62 mL/min — AB (ref >90)
Glucose, Bld: 88 mg/dL (ref 70–99)
POTASSIUM: 3.6 mmol/L (ref 3.5–5.1)
Sodium: 141 mmol/L (ref 135–145)

## 2014-07-23 LAB — CBC
HEMATOCRIT: 41.3 % (ref 36.0–46.0)
Hemoglobin: 13.1 g/dL (ref 12.0–15.0)
MCH: 27.5 pg (ref 26.0–34.0)
MCHC: 31.7 g/dL (ref 30.0–36.0)
MCV: 86.6 fL (ref 78.0–100.0)
Platelets: 265 10*3/uL (ref 150–400)
RBC: 4.77 MIL/uL (ref 3.87–5.11)
RDW: 15 % (ref 11.5–15.5)
WBC: 7.5 10*3/uL (ref 4.0–10.5)

## 2014-07-23 LAB — BRAIN NATRIURETIC PEPTIDE: B Natriuretic Peptide: 975.6 pg/mL — ABNORMAL HIGH (ref 0.0–100.0)

## 2014-07-23 SURGERY — LEFT AND RIGHT HEART CATHETERIZATION WITH CORONARY ANGIOGRAM
Anesthesia: LOCAL

## 2014-07-23 MED ORDER — ACETAMINOPHEN 325 MG PO TABS: 650.0000 mg | ORAL_TABLET | ORAL | Status: DC | PRN

## 2014-07-23 MED ORDER — HEPARIN SODIUM (PORCINE) 5000 UNIT/ML IJ SOLN
5000.0000 [IU] | Freq: Three times a day (TID) | INTRAMUSCULAR | Status: DC
  Administered 2014-07-23 – 2014-07-26 (×8): 5000 [IU] via SUBCUTANEOUS
  Filled 2014-07-23 (×9): qty 1

## 2014-07-23 MED ORDER — CARVEDILOL 3.125 MG PO TABS
3.1250 mg | ORAL_TABLET | Freq: Two times a day (BID) | ORAL | Status: DC
  Administered 2014-07-23: 3.125 mg via ORAL
  Filled 2014-07-23 (×2): qty 1

## 2014-07-23 MED ORDER — DIAZEPAM 5 MG PO TABS
2.5000 mg | ORAL_TABLET | Freq: Once | ORAL | Status: AC
  Administered 2014-07-23: 2.5 mg via ORAL
  Filled 2014-07-23: qty 1

## 2014-07-23 MED ORDER — SODIUM CHLORIDE 0.9 % IJ SOLN: 3.0000 mL | Freq: Two times a day (BID) | INTRAMUSCULAR | Status: DC

## 2014-07-23 MED ORDER — FUROSEMIDE 40 MG PO TABS
40.0000 mg | ORAL_TABLET | Freq: Every day | ORAL | Status: DC
  Administered 2014-07-25 – 2014-07-27 (×2): 40 mg via ORAL
  Filled 2014-07-23 (×6): qty 1

## 2014-07-23 MED ORDER — ONDANSETRON HCL 4 MG/2ML IJ SOLN: 4.0000 mg | Freq: Four times a day (QID) | INTRAMUSCULAR | Status: DC | PRN

## 2014-07-23 MED ORDER — SODIUM CHLORIDE 0.9 % IV SOLN: 250.0000 mL | INTRAVENOUS | Status: DC | PRN

## 2014-07-23 MED ORDER — SODIUM CHLORIDE 0.9 % IJ SOLN: 3.0000 mL | INTRAMUSCULAR | Status: DC | PRN

## 2014-07-23 MED ORDER — LISINOPRIL 5 MG PO TABS
5.0000 mg | ORAL_TABLET | Freq: Two times a day (BID) | ORAL | Status: DC
  Administered 2014-07-23: 5 mg via ORAL
  Filled 2014-07-23 (×4): qty 1

## 2014-07-23 MED ORDER — POTASSIUM CHLORIDE CRYS ER 20 MEQ PO TBCR
40.0000 meq | EXTENDED_RELEASE_TABLET | Freq: Once | ORAL | Status: AC
Start: 2014-07-23 — End: 2014-07-23
  Administered 2014-07-23: 40 meq via ORAL
  Filled 2014-07-23: qty 2

## 2014-07-23 MED ORDER — CARVEDILOL 3.125 MG PO TABS
3.1250 mg | ORAL_TABLET | Freq: Two times a day (BID) | ORAL | Status: DC
  Filled 2014-07-23 (×3): qty 1

## 2014-07-23 NOTE — Progress Notes (Signed)
Patient ID: Katrina Peck, female   DOB: 11/13/1945, 69 y.o.   MRN: [id number]  Patient with history of HTN was admitted 4/26 with 3 months of exertional dyspnea, weight gain, cough, and orthopnea.    She was started on IV Lasix and has diuresed well.  Weight down another 2 lbs.  Creatinine and BP stable.  Starting to breathe better.  Echo shows EF 20% with diffuse hypokinesis and mildly decreased RV systolic function.  There is a large pericardial effusion but definite signs of tamponade are not present.  The IVC shows normal respirophasic variation.   Scheduled Meds: . aspirin EC  81 mg Oral Daily  . diazepam  2.5 mg Oral Once  . furosemide  80 mg Intravenous BID  . heparin  5,000 Units Subcutaneous 3 times per day  . lisinopril  5 mg Oral BID  . potassium chloride  40 mEq Oral Once  . sodium chloride  3 mL Intravenous Q12H  . sodium chloride  3 mL Intravenous Q12H  . spironolactone  25 mg Oral Daily   Continuous Infusions: . sodium chloride     PRN Meds:.sodium chloride, sodium chloride, acetaminophen, ondansetron (ZOFRAN) IV, sodium chloride, sodium chloride    Filed Vitals:   07/22/14 1518 07/22/14 1704 07/22/14 2023 07/23/14 0509  BP: 103/71 154/102 99/75 116/75  Pulse: 74 82 85 78  Temp: 97.9 F (36.6 C)  98 F (36.7 C) 98.3 F (36.8 C)  TempSrc: Oral  Oral Oral  Resp: 18  18 16  Height:      Weight:    99 lb 14.4 oz (45.314 kg)  SpO2: 98%  99% 98%    Intake/Output Summary (Last 24 hours) at 07/23/14 0830 Last data filed at 07/23/14 0759  Gross per 24 hour  Intake    655 ml  Output   1800 ml  Net  -1145 ml    LABS: Basic Metabolic Panel:  Recent Labs  07/20/14 2221  07/22/14 0410 07/23/14 0545  NA  --   < > 140 141  K  --   < > 3.4* 3.6  CL  --   < > 102 103  CO2  --   < > 28 31  GLUCOSE  --   < > 85 88  BUN  --   < > 14 17  CREATININE  --   < > 0.94 0.93  CALCIUM  --   < > 8.1* 8.4  MG 1.8  --   --   --   < > = values in this interval not  displayed. Liver Function Tests: No results for input(s): AST, ALT, ALKPHOS, BILITOT, PROT, ALBUMIN in the last 72 hours. No results for input(s): LIPASE, AMYLASE in the last 72 hours. CBC:  Recent Labs  07/22/14 0410 07/23/14 0545  WBC 6.2 7.5  HGB 11.9* 13.1  HCT 37.1 41.3  MCV 85.3 86.6  PLT 240 265   Cardiac Enzymes:  Recent Labs  07/20/14 2221 07/21/14 0419 07/21/14 0930  TROPONINI <0.03 <0.03 <0.03   BNP: Invalid input(s): POCBNP D-Dimer: No results for input(s): DDIMER in the last 72 hours. Hemoglobin A1C: No results for input(s): HGBA1C in the last 72 hours. Fasting Lipid Panel: No results for input(s): CHOL, HDL, LDLCALC, TRIG, CHOLHDL, LDLDIRECT in the last 72 hours. Thyroid Function Tests:  Recent Labs  07/20/14 2221  TSH 1.679   Anemia Panel: No results for input(s): VITAMINB12, FOLATE, FERRITIN, TIBC, IRON, RETICCTPCT in the last 72   hours.  RADIOLOGY: Dg Chest 2 View  07/20/2014   CLINICAL DATA:  Short  of breath and weakness for 2 weeks  EXAM: CHEST  2 VIEW  COMPARISON:  None.  FINDINGS: There is moderate cardiac enlargement. Bilateral pleural effusions and interstitial edema is identified. Decreased aeration to lung bases likely represents atelectasis. Mild degenerative disc disease within the thoracic spine.  IMPRESSION: 1. Cardiac enlargement and CHF.   Electronically Signed   By: Taylor  Stroud M.D.   On: 07/20/2014 19:01    PHYSICAL EXAM General: NAD Neck: JVP 9-10 cm, no thyromegaly or thyroid nodule.  Lungs: Decreased breath sounds at bases bilaterally.  CV: Lateral PMI.  Heart regular S1/S2, +S3, no murmur.  1+ edema at ankles   Abdomen: Soft, nontender, no hepatosplenomegaly, no distention.  Neurologic: Alert and oriented x 3.  Psych: Normal affect. Extremities: No clubbing or cyanosis.   TELEMETRY: Reviewed telemetry pt in NSR  ASSESSMENT AND PLAN: 69 yo with history of HTN was admitted 4/26 with 3 months of exertional dyspnea,  weight gain, cough, and orthopnea.  1. Acute/chronic systolic CHF: Patient was admitted with NYHA class IIIb-IV symptoms and significant volume overload with S3 on exam.  EF 20% on echo with diffuse hypokinesis.  She has a history of untreated HTN.  Symptoms have been present since 1/16.  No viral-type syndrome.  No drugs/ETOH.  No family history of cardiomypathy.  TSH normal.  No chest pain.  She had not had her BP checked recently prior to admission, mildly elevated initially in the hospital.  She has diuresed well so far with weight down considerably (21 lbs).  Volume status improved, still appears mildly volume overloaded. - Continue Lasix 80 mg IV bid pending RHC.  - Increase lisinopril to 5 mg bid, continue spironolactone.   - Start beta blocker prior to discharge.   - RHC/LHC today to assess cardiac output and filling pressures and to rule in/out CAD as cause of cardiomyopathy. This will also allow assessment of hemodynamic significance of pericardial effusion.  If cardiac cath shows no significant disease, would plan eventual cardiac MRI to assess for infiltrative disease. I had an extensive discussion this morning with patient about risks/benefits of procedure. 2. HTN: BP stable.  3. Pericardial effusion: Large, but not definitive tamponade by echo.  IVC shows normal respirophasic variation.  BP stable with cardiac meds and diuresis, creatinine ok.  She is diuresing well.  Will assess with RHC/LHC, may need pericardiocentesis for diagnostic/therapeutic purposes.   Katrina Peck 07/23/2014 8:30 AM   

## 2014-07-23 NOTE — Plan of Care (Signed)
Problem: Phase I Progression Outcomes Goal: EF % per last Echo/documented,Core Reminder form on chart Outcome: Completed/Met Date Met:  07/23/14 EF = 20% per ECHO performed on 07/21/14.

## 2014-07-23 NOTE — H&P (View-Only) (Signed)
Patient ID: Katrina Peck, female   DOB: [birth year], 69 y.o.   MRN: [id number] P  Patient with history of HTN was admitted 4/26 with 3 months of exertional dyspnea, weight gain, cough, and orthopnea.    She was started on IV Lasix and has diuresed well.  Weight down another 2 lbs.  Creatinine and BP stable.  Starting to breathe better.  Echo shows EF 20% with diffuse hypokinesis and mildly decreased RV systolic function.  There is a large pericardial effusion but definite signs of tamponade are not present.  The IVC shows normal respirophasic variation.   Scheduled Meds: . aspirin EC  81 mg Oral Daily  . diazepam  2.5 mg Oral Once  . furosemide  80 mg Intravenous BID  . heparin  5,000 Units Subcutaneous 3 times per day  . lisinopril  5 mg Oral BID  . potassium chloride  40 mEq Oral Once  . sodium chloride  3 mL Intravenous Q12H  . sodium chloride  3 mL Intravenous Q12H  . spironolactone  25 mg Oral Daily   Continuous Infusions: . sodium chloride     PRN Meds:.sodium chloride, sodium chloride, acetaminophen, ondansetron (ZOFRAN) IV, sodium chloride, sodium chloride    Filed Vitals:   07/22/14 1518 07/22/14 1704 07/22/14 2023 07/23/14 0509  BP: 103/71 154/102 99/75 116/75  Pulse: 74 82 85 78  Temp: 97.9 F (36.6 C)  98 F (36.7 C) 98.3 F (36.8 C)  TempSrc: Oral  Oral Oral  Resp: 18  18 16   Height:      Weight:    99 lb 14.4 oz (45.314 kg)  SpO2: 98%  99% 98%    Intake/Output Summary (Last 24 hours) at 07/23/14 0830 Last data filed at 07/23/14 0759  Gross per 24 hour  Intake    655 ml  Output   1800 ml  Net  -1145 ml    LABS: Basic Metabolic Panel:  Recent Labs  00/92/33 2221  07/22/14 0410 07/23/14 0545  NA  --   < > 140 141  K  --   < > 3.4* 3.6  CL  --   < > 102 103  CO2  --   < > 28 31  GLUCOSE  --   < > 85 88  BUN  --   < > 14 17  CREATININE  --   < > 0.94 0.93  CALCIUM  --   < > 8.1* 8.4  MG 1.8  --   --   --   < > = values in this interval not  displayed. Liver Function Tests: No results for input(s): AST, ALT, ALKPHOS, BILITOT, PROT, ALBUMIN in the last 72 hours. No results for input(s): LIPASE, AMYLASE in the last 72 hours. CBC:  Recent Labs  07/22/14 0410 07/23/14 0545  WBC 6.2 7.5  HGB 11.9* 13.1  HCT 37.1 41.3  MCV 85.3 86.6  PLT 240 265   Cardiac Enzymes:  Recent Labs  07/20/14 2221 07/21/14 0419 07/21/14 0930  TROPONINI <0.03 <0.03 <0.03   BNP: Invalid input(s): POCBNP D-Dimer: No results for input(s): DDIMER in the last 72 hours. Hemoglobin A1C: No results for input(s): HGBA1C in the last 72 hours. Fasting Lipid Panel: No results for input(s): CHOL, HDL, LDLCALC, TRIG, CHOLHDL, LDLDIRECT in the last 72 hours. Thyroid Function Tests:  Recent Labs  07/20/14 2221  TSH 1.679   Anemia Panel: No results for input(s): VITAMINB12, FOLATE, FERRITIN, TIBC, IRON, RETICCTPCT in the last 72  hours.  RADIOLOGY: Dg Chest 2 View  07/20/2014   CLINICAL DATA:  Short  of breath and weakness for 2 weeks  EXAM: CHEST  2 VIEW  COMPARISON:  None.  FINDINGS: There is moderate cardiac enlargement. Bilateral pleural effusions and interstitial edema is identified. Decreased aeration to lung bases likely represents atelectasis. Mild degenerative disc disease within the thoracic spine.  IMPRESSION: 1. Cardiac enlargement and CHF.   Electronically Signed   By: Signa Kell M.D.   On: 07/20/2014 19:01    PHYSICAL EXAM General: NAD Neck: JVP 9-10 cm, no thyromegaly or thyroid nodule.  Lungs: Decreased breath sounds at bases bilaterally.  CV: Lateral PMI.  Heart regular S1/S2, +S3, no murmur.  1+ edema at ankles   Abdomen: Soft, nontender, no hepatosplenomegaly, no distention.  Neurologic: Alert and oriented x 3.  Psych: Normal affect. Extremities: No clubbing or cyanosis.   TELEMETRY: Reviewed telemetry pt in NSR  ASSESSMENT AND PLAN: 69 yo with history of HTN was admitted 4/26 with 3 months of exertional dyspnea,  weight gain, cough, and orthopnea.  1. Acute/chronic systolic CHF: Patient was admitted with NYHA class IIIb-IV symptoms and significant volume overload with S3 on exam.  EF 20% on echo with diffuse hypokinesis.  She has a history of untreated HTN.  Symptoms have been present since 1/16.  No viral-type syndrome.  No drugs/ETOH.  No family history of cardiomypathy.  TSH normal.  No chest pain.  She had not had her BP checked recently prior to admission, mildly elevated initially in the hospital.  She has diuresed well so far with weight down considerably (21 lbs).  Volume status improved, still appears mildly volume overloaded. - Continue Lasix 80 mg IV bid pending RHC.  - Increase lisinopril to 5 mg bid, continue spironolactone.   - Start beta blocker prior to discharge.   - RHC/LHC today to assess cardiac output and filling pressures and to rule in/out CAD as cause of cardiomyopathy. This will also allow assessment of hemodynamic significance of pericardial effusion.  If cardiac cath shows no significant disease, would plan eventual cardiac MRI to assess for infiltrative disease. I had an extensive discussion this morning with patient about risks/benefits of procedure. 2. HTN: BP stable.  3. Pericardial effusion: Large, but not definitive tamponade by echo.  IVC shows normal respirophasic variation.  BP stable with cardiac meds and diuresis, creatinine ok.  She is diuresing well.  Will assess with RHC/LHC, may need pericardiocentesis for diagnostic/therapeutic purposes.   [name] 07/23/2014 8:30 AM

## 2014-07-23 NOTE — Interval H&P Note (Signed)
History and Physical Interval Note:  07/23/2014 12:13 PM  Katrina Peck  has presented today for surgery, with the diagnosis of CHF  The various methods of treatment have been discussed with the patient and family. After consideration of risks, benefits and other options for treatment, the patient has consented to  Procedure(s): LEFT AND RIGHT HEART CATHETERIZATION WITH CORONARY ANGIOGRAM (N/A) as a surgical intervention .  The patient's history has been reviewed, patient examined, no change in status, stable for surgery.  I have reviewed the patient's chart and labs.  Questions were answered to the patient's satisfaction.     Dalton [employer]

## 2014-07-23 NOTE — Progress Notes (Signed)
CAD Assessment (Coronary Angiography With or Without Left Heart Catheterization and/or Left Ventriculography)  Patient Information:    Suspected CAD: No Prior PCI, No Prior CABG, and No Prior Angiogram Showing >= 50% Angiographic Stenosis   Prior Noninvasive Testing: Echocardiography (TTE)   Newly recognized LV systolic dysfunction (i.e., LVEF   Pretest Symptom Status:Symptomatic  AUC Score:   A (8)   Indication:   24  [name] 07/23/2014

## 2014-07-23 NOTE — Progress Notes (Signed)
TR band removed and guaze in place.  Will continue to monitor.

## 2014-07-23 NOTE — Clinical Documentation Improvement (Signed)
"  Respiratory Failure" is documented in the current H&P.  Flowsheets reviewed in [facility].  02 sats have ranged from 93 to 100% on room air.  Respiratory rate has ranged from the teens to the mid 20's.  Please clarify if the diagnosis of respiratory failure is:   - Not Applicable to this admission  - IS applicable to this admission, including clinical indicators  - Other condition  - Unable to clinically determine  Thank You, Jerral Ralph ,RN Clinical Documentation Specialist:  [phone] [facility]- Health Information Management

## 2014-07-23 NOTE — CV Procedure (Signed)
    Cardiac Catheterization Procedure Note  Name: Katrina Peck MRN: [id number] DOB: [birth year]  Procedure: Right Heart Cath, Left Heart Cath, Selective Coronary Angiography  Indication: Cardiomyopathy, pericardial effusion   Procedural Details: The right radial and brachial areas were prepped, draped, and anesthetized with 1% lidocaine. There was a pre-existing peripheral IV in the right brachial area that was replaced with a 63F venous sheath. A Swan-Ganz catheter was used for the right heart catheterization. Standard protocol was followed for recording of right heart pressures and sampling of oxygen saturations. Fick cardiac output was calculated. The right radial artery was entered using modified Seldinger technique and a 6 French sheath was placed.  The patient received 3 mg IA verapamil and weight-based IV heparin.  Standard Judkins catheters were used for selective coronary angiography. Simultaneous RV and LV pressures were measured. There were no immediate procedural complications. The patient was transferred to the post catheterization recovery area for further monitoring.  Procedural Findings: Hemodynamics (mmHg) RA mean 6 RV 27/6 PA 28/11, mean 18 PCWP mean 9  LV 96/11 AO 100/65  Simultaneous RV and LV pressure tracings did not show significant ventricular interdependence.  There was no significant respirophasic variation of the LV pressure tracing (no more than 4-5 mmHg variance).   Oxygen saturations: PA 73% AO 95%  Cardiac Output (Fick) 4.92  Cardiac Index (Fick) 3.39   Coronary angiography: Coronary dominance: right  Left mainstem: Short, no significant disease.   Left anterior descending (LAD): No angiographic coronary disease.   Left circumflex (LCx): No angiographic coronary disease.   Right coronary artery (RCA): No angiographic coronary disease.   Left ventriculography: Not done, recent echo.  Final Conclusions:  Filling pressures normalized and cardiac  output preserved.  No CAD, nonischemic cardiomyopathy.  Hemodynamics were not consistent with pericardial tamponade, suspect the pericardial effusion is not hemodynamically significant.   Recommendations: Transition to po Lasix.  Can add low dose Coreg tonight.  Will get limited echo to reassess pericardial effusion to see if it is decreasing.   [name] 07/23/2014, 1:02 PM

## 2014-07-24 ENCOUNTER — Encounter (HOSPITAL_COMMUNITY): Payer: Self-pay | Admitting: Nurse Practitioner

## 2014-07-24 ENCOUNTER — Inpatient Hospital Stay (HOSPITAL_COMMUNITY): Payer: Medicare Other

## 2014-07-24 DIAGNOSIS — I5021 Acute systolic (congestive) heart failure: Secondary | ICD-10-CM

## 2014-07-24 DIAGNOSIS — I428 Other cardiomyopathies: Secondary | ICD-10-CM

## 2014-07-24 DIAGNOSIS — J90 Pleural effusion, not elsewhere classified: Secondary | ICD-10-CM

## 2014-07-24 DIAGNOSIS — I110 Hypertensive heart disease with heart failure: Secondary | ICD-10-CM

## 2014-07-24 DIAGNOSIS — I313 Pericardial effusion (noninflammatory): Secondary | ICD-10-CM

## 2014-07-24 DIAGNOSIS — I3139 Other pericardial effusion (noninflammatory): Secondary | ICD-10-CM

## 2014-07-24 LAB — BASIC METABOLIC PANEL
Anion gap: 8 (ref 5–15)
BUN: 24 mg/dL — AB (ref 6–23)
CHLORIDE: 102 mmol/L (ref 96–112)
CO2: 27 mmol/L (ref 19–32)
CREATININE: 0.98 mg/dL (ref 0.50–1.10)
Calcium: 8.2 mg/dL — ABNORMAL LOW (ref 8.4–10.5)
GFR calc Af Amer: 67 mL/min — ABNORMAL LOW (ref >90)
GFR calc non Af Amer: 58 mL/min — ABNORMAL LOW (ref >90)
GLUCOSE: 76 mg/dL (ref 70–99)
POTASSIUM: 4.3 mmol/L (ref 3.5–5.1)
Sodium: 137 mmol/L (ref 135–145)

## 2014-07-24 LAB — CBC
HCT: 38.5 % (ref 36.0–46.0)
HEMOGLOBIN: 12.6 g/dL (ref 12.0–15.0)
MCH: 28.1 pg (ref 26.0–34.0)
MCHC: 32.7 g/dL (ref 30.0–36.0)
MCV: 85.9 fL (ref 78.0–100.0)
Platelets: 231 10*3/uL (ref 150–400)
RBC: 4.48 MIL/uL (ref 3.87–5.11)
RDW: 15.1 % (ref 11.5–15.5)
WBC: 6.9 10*3/uL (ref 4.0–10.5)

## 2014-07-24 MED ORDER — CARVEDILOL 3.125 MG PO TABS
1.5620 mg | ORAL_TABLET | Freq: Two times a day (BID) | ORAL | Status: DC
  Administered 2014-07-24 – 2014-07-25 (×3): 1.562 mg via ORAL
  Filled 2014-07-24 (×6): qty 0.5

## 2014-07-24 MED ORDER — LISINOPRIL 2.5 MG PO TABS
2.5000 mg | ORAL_TABLET | Freq: Two times a day (BID) | ORAL | Status: DC
  Administered 2014-07-24 – 2014-07-25 (×2): 2.5 mg via ORAL
  Filled 2014-07-24 (×6): qty 1

## 2014-07-24 MED ORDER — SPIRONOLACTONE 12.5 MG HALF TABLET
12.5000 mg | ORAL_TABLET | Freq: Every day | ORAL | Status: DC
  Administered 2014-07-25: 12.5 mg via ORAL
  Filled 2014-07-24 (×3): qty 1

## 2014-07-24 NOTE — Consult Note (Signed)
[address]       [place] [address]             [phone]        KAMYLAH MANZO [facility] Medical Record #[id number] Date of Birth: 30-Sep-1945  Referring: Bensimhon Primary Care: No PCP Per Patient  Chief Complaint:    Chief Complaint  Patient presents with  . Shortness of Breath    History of Present Illness:      Ms. Soliday is a 69 yo female with no previous cardiac history.  She has a H/O HTN however she was no on medication prior to admission.  She has been in her normal state of health until January of this year.  At that point she noticed she was experiencing shortness of breath with activity, PND, Orthopnea, edema in her legs, weight gain, and increase in her abdomen.  She denied chest pain, nausea, vomiting, or palpitations.  The patient continued to have symptoms and her husband encouraged her to establish care.  She was evaluated by Dr. Duanne Guess on 07/20/2014 at which time he felt she was likely suffering CHF and respiratory failure.  He felt she warranted evaluation in the ED.  Upon arrival to the ED the patients BNP was elevated, CXR had changes consistent with CHF, and EKG showed evidence of LBBB.  Troponin levels were negative.  She was diagnosed with acute HF and admitted to the Cardiology service.  She was treated with aggressive diuresis with good response and weight has decreased more than 20 lbs.  Echocardiogram was obtained and showed a reduced EF of 20%, diffuse hypokinesis, and decreased RV function.  There was also a large pericardial effusion which did not exhibit signs of tamponade.  It was felt she should undergo catheterization to further assess for possible underlying CAD.  This was performed on 07/23/2014 and showed no evidence of CAD, her hemodynamics were WNL and there was again no evidence of cardiac tamponade.  She finally has been evaluated by Advanced Heart Failure team, who feels she would benefit from drainage of pericardial effusion  for diagnostic purposes.  Currently the patient is feeling better.  She states she notices a big difference in her LE edema and she states she is breathing much better.      Current Activity/ Functional Status: Patient is independent with mobility/ambulation, transfers, ADL's, IADL's.   Zubrod Score: At the time of surgery this patient's most appropriate activity status/level should be described as:     0    Normal activity, no symptoms     1    Restricted in physical strenuous activity but ambulatory, able to do out light work     2    Ambulatory and capable of self care, unable to do work activities, up and about                 more than 50%  Of the time                                3    Only limited self care, in bed greater than 50% of waking hours     4    Completely disabled, no self care, confined to bed or chair     5    Moribund  Past Medical History  Diagnosis Date  . Anemia   . Hypertension   . Blood  transfusion without reported diagnosis   . Chronic systolic CHF (congestive heart failure)     a. 06/2014 Echo: EF 20%.  Marland Kitchen NICM (nonischemic cardiomyopathy)     a. 06/2014 Echo: EF 20%, diff HK, mild MR, mildly dil LA/RA, mildly reduced RV fxn;  b. 06/2014 Cath: nl cors.  . Pericardial effusion     a. 06/2014 Large effusion, no tamponade phys.    Past Surgical History  Procedure Laterality Date  . No past surgeries    . Left and right heart catheterization with coronary angiogram N/A 07/23/2014    Procedure: LEFT AND RIGHT HEART CATHETERIZATION WITH CORONARY ANGIOGRAM;  Surgeon: Laurey Morale, MD;  Location: [facility] CATH LAB;  Service: Cardiovascular;  Laterality: N/A;    History  Smoking status  . Never Smoker   Smokeless tobacco  . Never Used    History  Alcohol Use No    History   Social History  . Marital Status: Married    Spouse Name: N/A  . Number of Children: N/A  . Years of Education: N/A   Occupational History  . Not on file.   Social  History Main Topics  . Smoking status: Never Smoker   . Smokeless tobacco: Never Used  . Alcohol Use: No  . Drug Use: No  . Sexual Activity: No   Other Topics Concern  . Not on file   Social History Narrative   Lives in [place] with husband.  Not currently working.    No Known Allergies  Current Facility-Administered Medications  Medication Dose Route Frequency Provider Last Rate Last Dose  . 0.9 %  sodium chloride infusion  250 mL Intravenous PRN Ok Anis, NP      . acetaminophen (TYLENOL) tablet 650 mg  650 mg Oral Q4H PRN Laurey Morale, MD      . aspirin EC tablet 81 mg  81 mg Oral Daily Ok Anis, NP   81 mg at 07/24/14 1104  . carvedilol (COREG) tablet 1.562 mg  1.562 mg Oral BID WC Ok Anis, NP   1.562 mg at 07/24/14 1806  . furosemide (LASIX) tablet 40 mg  40 mg Oral Daily Laurey Morale, MD   40 mg at 07/24/14 1105  . heparin injection 5,000 Units  5,000 Units Subcutaneous 3 times per day Laurey Morale, MD   5,000 Units at 07/24/14 1325  . lisinopril (PRINIVIL,ZESTRIL) tablet 2.5 mg  2.5 mg Oral BID Ok Anis, NP      . ondansetron [facility]) injection 4 mg  4 mg Intravenous Q6H PRN Laurey Morale, MD      . sodium chloride 0.9 % injection 3 mL  3 mL Intravenous Q12H Ok Anis, NP   3 mL at 07/24/14 1000  . sodium chloride 0.9 % injection 3 mL  3 mL Intravenous PRN Ok Anis, NP      . Melene Muller ON 07/25/2014] spironolactone (ALDACTONE) tablet 12.5 mg  12.5 mg Oral Daily Ok Anis, NP        Prescriptions prior to admission  Medication Sig Dispense Refill Last Dose  . triamcinolone lotion (KENALOG) 0.1 % Apply sparsely twice daily to rash 120 mL 2 Past Week at Unknown time    Family History  Problem Relation Age of Onset  . Hypertension Sister   . Diabetes Maternal Grandmother   . Cancer Mother     died young (pt was only in [employer] @ the time)  .  Other Father     died in his 49's.     Review  of Systems:      Cardiac Review of Systems: Y or N  Chest Pain [ y   ]  Resting SOB [ y  ] Exertional SOB  [ y ]  Orthopnea [ y ]   Pedal Edema [place]   ]    Palpitations [  ] Syncope  [  ]   Presyncope [   ]  General Review of Systems: [Y] = yes [  ]=no Constitional: recent weight change [ y ]; anorexia [  ]; fatigue [  ]; nausea [  ]; night sweats [  ]; fever [  ]; or chills [  ]                                                               Dental: poor dentition[  ]; Last Dentist visit:   Eye : blurred vision [  ]; diplopia [   ]; vision changes [  ];  Amaurosis fugax[  ]; Resp: cough [  ];  wheezing[  ];  hemoptysis[  ]; shortness of breath[ y ]; paroxysmal nocturnal dyspnea[y  ]; dyspnea on exertion[ y ]; or orthopnea[  ];  GI:  gallstones[  ], vomiting[  ];  dysphagia[  ]; melena[  ];  hematochezia [  ]; heartburn[  ];   Hx of  Colonoscopy[  ]; GU: kidney stones [  ]; hematuria[  ];   dysuria [  ];  nocturia[  ];  history of     obstruction [  ]; urinary frequency [  ]             Skin: rash, swelling[  ];, hair loss[  ];  peripheral edema[y  ];  or itching[y  ]; Musculosketetal: myalgias[  ];  joint swelling[  ];  joint erythema[  ];  joint pain[  ];  back pain[  ];  Heme/Lymph: bruising[  ];  bleeding[  ];  anemia[  ];  Neuro: TIA[  ];  headaches[  ];  stroke[  ];  vertigo[  ];  seizures[  ];   paresthesias[  ];  difficulty walking[  ];  Psych:depression[  ]; anxiety[  ];  Endocrine: diabetes[  ];  thyroid dysfunction[  ];  Immunizations: Flu [  ]; Pneumococcal[  ];  Other:  Physical Exam: BP 103/61 mmHg  Pulse 76  Temp(Src) 98.1 F (36.7 C) (Oral)  Resp 20  Ht 5\' 3"  (1.6 m)  Wt 98 lb 14.4 oz (44.861 kg)  BMI 17.52 kg/m2  SpO2 100%   General appearance: alert, cooperative and no distress Head: Normocephalic, without obvious abnormality, atraumatic Neck: no adenopathy, no carotid bruit, no JVD, supple, symmetrical, trachea midline and thyroid not enlarged, symmetric, no  tenderness/mass/nodules Resp: clear to auscultation bilaterally Cardio: regular rate and rhythm distant cardiac tones GI: soft, non-tender; bowel sounds normal; no masses,  no organomegaly Extremities: edema 1+ Neurologic: Grossly normal  Diagnostic Studies & Laboratory data:     Recent Radiology Findings:   Ct Chest Wo Contrast  07/24/2014   CLINICAL DATA:  Pericardial effusion.  EXAM: CT CHEST WITHOUT CONTRAST  TECHNIQUE: Multidetector CT imaging of the chest was performed following the standard protocol  without IV contrast.  COMPARISON:  Chest x-ray 07/20/2014  FINDINGS: Heart: There is large pericardial effusion. Effusion measures 2.6 cm and appears low attenuation. There is aortic valve calcification. No significant coronary artery calcifications.  Vascular structures: Minimal atherosclerosis of the thoracic aorta. No aneurysm.  Mediastinum/thyroid: The visualized portion of the thyroid gland has a normal appearance. No mediastinal, hilar, or axillary adenopathy.  Lungs/Airways: Airways are patent. There is a right pleural effusion. There are no focal consolidations. No pulmonary edema. Minimal left lower lobe atelectasis.  Upper abdomen: Unremarkable.  Chest wall/osseous structures: No suspicious lytic or blastic lesions are identified.  IMPRESSION: 1. Large pericardial effusion. 2. Small right pleural effusion.   Electronically Signed   By: Norva Pavlov M.D.   On: 07/24/2014 18:24     I have independently reviewed the above radiologic studies.  ECHO: large  pericardial effusion   Recent Lab Findings: Lab Results  Component Value Date   WBC 6.9 07/24/2014   HGB 12.6 07/24/2014   HCT 38.5 07/24/2014   PLT 231 07/24/2014   GLUCOSE 76 07/24/2014   ALT 13 12/24/2013   AST 19 12/24/2013   NA 137 07/24/2014   K 4.3 07/24/2014   CL 102 07/24/2014   CREATININE 0.98 07/24/2014   BUN 24* 07/24/2014   CO2 27 07/24/2014   TSH 1.679 07/20/2014   INR 1.10 07/22/2014       Assessment / Plan:      1. Large Pericardial Effusion- likely chronic, no evidence of tamponade- Discussed with patient and cardiology proceeding with subhyoid pericardial window for relief of fluid and dx. Plan for am 2. CV- CHF responding well to diuretics, on Coreg, Lisinopril, Lasix, Cleda Daub  The goals risks and alternatives of the planned surgical procedure pericardial window  have been discussed with the patient in detail. The risks of the procedure including death, infection, stroke, myocardial infarction, bleeding, blood transfusion have all been discussed specifically.  I have quoted Verita Schneiders a 2 % of perioperative mortality and a complication rate as high as 20 %. The patient's questions have been answered.GLENYS BEGEMAN is willing  to proceed with the planned procedure.  I  spent 30 minutes counseling the patient face to face and 50% or more the  time was spent in counseling and coordination of care. The total time spent in the appointment was 40 minutes.  Delight Ovens MD      [address] [place] [place] [address] Office [phone]   Beeper [phone]    @ME1 @ 07/24/2014 6:36 PM

## 2014-07-24 NOTE — Progress Notes (Signed)
  Echocardiogram 2D Echocardiogram has been performed.  Arvil Chaco 07/24/2014, 2:18 PM

## 2014-07-24 NOTE — Progress Notes (Addendum)
BP 94/70. Patient is asymptomatic at the time. No signs or symptoms of dizziness. No verbal complaints. PA notified. Ok to hold scheduled meds. Will be up to follow up with patient. Will continue to monitor patient for further changes in condition.

## 2014-07-24 NOTE — Progress Notes (Addendum)
Patient Name: Katrina Peck Date of Encounter: 07/24/2014    Principal Problem:   Acute systolic CHF (congestive heart failure), NYHA class 4 Active Problems:   NICM (nonischemic cardiomyopathy)   Essential hypertension   Pericardial effusion    SUBJECTIVE  No dyspnea/orthopnea.  Overall feels much better.  Still some lower ext edema.  BP trending low. Very eager to go home.   CURRENT MEDS . aspirin EC  81 mg Oral Daily  . carvedilol  3.125 mg Oral BID WC  . furosemide  40 mg Oral Daily  . heparin  5,000 Units Subcutaneous 3 times per day  . lisinopril  5 mg Oral BID  . sodium chloride  3 mL Intravenous Q12H  . spironolactone  25 mg Oral Daily    OBJECTIVE  Filed Vitals:   07/23/14 1500 07/23/14 2228 07/24/14 0600 07/24/14 0930  BP: 91/75 100/66 106/72 86/57  Pulse: 75 113 71 76  Temp:  97.9 F (36.6 C) 98 F (36.7 C) 97.3 F (36.3 C)  TempSrc:  Oral Oral Oral  Resp:  22 20 20   Height:      Weight:   98 lb 14.4 oz (44.861 kg)   SpO2:  93% 98% 100%    Intake/Output Summary (Last 24 hours) at 07/24/14 1055 Last data filed at 07/24/14 1008  Gross per 24 hour  Intake   1280 ml  Output   1825 ml  Net   -545 ml   Filed Weights   07/22/14 0452 07/23/14 0509 07/24/14 0600  Weight: 101 lb 3.2 oz (45.904 kg) 99 lb 14.4 oz (45.314 kg) 98 lb 14.4 oz (44.861 kg)    PHYSICAL EXAM  General: Pleasant, NAD. Neuro: Alert and oriented X 3. Moves all extremities spontaneously. Psych: Normal affect. HEENT:  Normal  Neck: Supple without bruits or JVP 7-8 Lungs:  Resp regular and unlabored, CTA. Heart: RRR + s4, or murmurs/rubs. Abdomen: Soft, non-tender, non-distended, BS + x 4.  Extremities: No clubbing, cyanosis.  Trace to 1+ bilat LE edema. DP/PT/Radials 2+ and equal bilaterally.  Accessory Clinical Findings  CBC  Recent Labs  07/23/14 0545 07/24/14 0555  WBC 7.5 6.9  HGB 13.1 12.6  HCT 41.3 38.5  MCV 86.6 85.9  PLT 265 628   Basic Metabolic  Panel  Recent Labs  07/23/14 0545 07/24/14 0555  NA 141 137  K 3.6 4.3  CL 103 102  CO2 31 27  GLUCOSE 88 76  BUN 17 24*  CREATININE 0.93 0.98  CALCIUM 8.4 8.2*   TELE  NSR with LBBB - reviewed personally  Radiology/Studies  Dg Chest 2 View  07/20/2014   CLINICAL DATA:  Short  of breath and weakness for 2 weeks  EXAM: CHEST  2 VIEW  COMPARISON:  None.  FINDINGS: There is moderate cardiac enlargement. Bilateral pleural effusions and interstitial edema is identified. Decreased aeration to lung bases likely represents atelectasis. Mild degenerative disc disease within the thoracic spine.  IMPRESSION: 1. Cardiac enlargement and CHF.   Electronically Signed   By: Kerby Moors M.D.   On: 07/20/2014 19:01    ASSESSMENT AND PLAN  1.  Acute stage IV systolic CHF/NICM:  S/p cath yesterday revealing nl cors, filling pressures, and CO/CI. Minus 465 ml overnight, 7.3 L for this admission/minus 15 lbs.  BUN/Creat up slightly.  Now on PO lasix.  BP's have been trending in high 80's to low 100's since adding coreg.  Will likely have to halve doses of bb/acei/spiro  to avoid symptomatic hypotension.  Follow BP today. Consider d/c late afternoon if BP stable vs tomorrow.  Plan to f/u echo in 3 mos.  2.  Essential HTN:  Much improved with trend toward hypotension.  3.  Large Pericardial Effusion:  No tamponade physiology on R heart cath yesterday.  F/U limited echo pending today.  Signed, Murray Hodgkins NP  Patient seen and examined independently. Emeline Gins, NP note reviewed carefully - agree with his assessment and plan. I have edited the note based on my findings.   I have reviewed cath findings personally and discussed with her. Coronaries normal. Filling pressures and cardiac output were normal. No evidence tamponade. BP low today. We are adjusting HF meds. F/u echo done today shows persistent large pericardial effusion > 3.0 cm despite diuresis. I think he will need a window to drain  and also to get diagnosis. I discussed with Dr. Aundra Dubin who agrees. Will consult TCTS. Check ANA, ESR and non-contrast CT chest.  Benay Spice 1:39 PM

## 2014-07-25 ENCOUNTER — Inpatient Hospital Stay (HOSPITAL_COMMUNITY): Payer: Medicare Other

## 2014-07-25 LAB — URINALYSIS, ROUTINE W REFLEX MICROSCOPIC
Bilirubin Urine: NEGATIVE
Glucose, UA: NEGATIVE mg/dL
Hgb urine dipstick: NEGATIVE
Ketones, ur: 15 mg/dL — AB
Nitrite: NEGATIVE
Protein, ur: NEGATIVE mg/dL
Specific Gravity, Urine: 1.023 (ref 1.005–1.030)
Urobilinogen, UA: 1 mg/dL (ref 0.0–1.0)
pH: 6.5 (ref 5.0–8.0)

## 2014-07-25 LAB — COMPREHENSIVE METABOLIC PANEL
ALT: 29 U/L (ref 14–54)
AST: 32 U/L (ref 15–41)
Albumin: 2.8 g/dL — ABNORMAL LOW (ref 3.5–5.0)
Alkaline Phosphatase: 95 U/L (ref 38–126)
Anion gap: 8 (ref 5–15)
BUN: 21 mg/dL — ABNORMAL HIGH (ref 6–20)
CO2: 28 mmol/L (ref 22–32)
Calcium: 8.8 mg/dL — ABNORMAL LOW (ref 8.9–10.3)
Chloride: 102 mmol/L (ref 101–111)
Creatinine, Ser: 1.27 mg/dL — ABNORMAL HIGH (ref 0.44–1.00)
GFR calc Af Amer: 49 mL/min — ABNORMAL LOW (ref >60)
GFR calc non Af Amer: 42 mL/min — ABNORMAL LOW (ref >60)
Glucose, Bld: 105 mg/dL — ABNORMAL HIGH (ref 70–99)
Potassium: 4.7 mmol/L (ref 3.5–5.1)
Sodium: 138 mmol/L (ref 135–145)
Total Bilirubin: 0.8 mg/dL (ref 0.3–1.2)
Total Protein: 6.5 g/dL (ref 6.5–8.1)

## 2014-07-25 LAB — SEDIMENTATION RATE: SED RATE: 5 mm/h (ref 0–22)

## 2014-07-25 LAB — CBC
HCT: 38.2 % (ref 36.0–46.0)
Hemoglobin: 12.2 g/dL (ref 12.0–15.0)
MCH: 27.7 pg (ref 26.0–34.0)
MCHC: 31.9 g/dL (ref 30.0–36.0)
MCV: 86.6 fL (ref 78.0–100.0)
Platelets: 234 10*3/uL (ref 150–400)
RBC: 4.41 MIL/uL (ref 3.87–5.11)
RDW: 14.8 % (ref 11.5–15.5)
WBC: 6.9 10*3/uL (ref 4.0–10.5)

## 2014-07-25 LAB — APTT: aPTT: 33 seconds (ref 24–37)

## 2014-07-25 LAB — BLOOD GAS, ARTERIAL
Acid-Base Excess: 1.1 mmol/L (ref 0.0–2.0)
Bicarbonate: 24.9 mEq/L — ABNORMAL HIGH (ref 20.0–24.0)
Drawn by: 249101
FIO2: 0.21 %
O2 Saturation: 97.6 %
Patient temperature: 98.6
TCO2: 26 mmol/L (ref 0–100)
pCO2 arterial: 37.3 mmHg (ref 35.0–45.0)
pH, Arterial: 7.438 (ref 7.350–7.450)
pO2, Arterial: 90.4 mmHg (ref 80.0–100.0)

## 2014-07-25 LAB — ABO/RH: ABO/RH(D): B POS

## 2014-07-25 LAB — TYPE AND SCREEN
ABO/RH(D): B POS
Antibody Screen: NEGATIVE

## 2014-07-25 LAB — PROTIME-INR
INR: 1.03 (ref 0.00–1.49)
Prothrombin Time: 13.6 seconds (ref 11.6–15.2)

## 2014-07-25 LAB — URINE MICROSCOPIC-ADD ON

## 2014-07-25 MED ORDER — CEFUROXIME SODIUM 1.5 G IJ SOLR
1.5000 g | INTRAMUSCULAR | Status: AC
  Administered 2014-07-26: 1.5 g via INTRAVENOUS
  Filled 2014-07-25: qty 1.5

## 2014-07-25 NOTE — Anesthesia Preprocedure Evaluation (Addendum)
Anesthesia Evaluation  Patient identified by MRN, date of birth, ID band Patient awake    Reviewed: Allergy & Precautions, NPO status , Patient's Chart, lab work & pertinent test results  Airway Mallampati: II  TM Distance: >3 FB Neck ROM: Full    Dental no notable dental hx.    Pulmonary neg pulmonary ROS,  breath sounds clear to auscultation  Pulmonary exam normal       Cardiovascular Exercise Tolerance: Poor hypertension, Pt. on medications +CHF Rhythm:Regular Rate:Normal     Neuro/Psych negative neurological ROS  negative psych ROS   GI/Hepatic negative GI ROS, Neg liver ROS,   Endo/Other  negative endocrine ROS  Renal/GU negative Renal ROS     Musculoskeletal negative musculoskeletal ROS (+)   Abdominal   Peds  Hematology  (+) anemia ,   Anesthesia Other Findings   Reproductive/Obstetrics negative OB ROS                            Anesthesia Physical Anesthesia Plan  ASA: III  Anesthesia Plan: General   Post-op Pain Management:    Induction: Intravenous  Airway Management Planned: Oral ETT  Additional Equipment: Arterial line, CVP, Ultrasound Guidance Line Placement and TEE  Intra-op Plan:   Post-operative Plan: Extubation in OR  Informed Consent: I have reviewed the patients History and Physical, chart, labs and discussed the procedure including the risks, benefits and alternatives for the proposed anesthesia with the patient or authorized representative who has indicated his/her understanding and acceptance.   Dental advisory given  Plan Discussed with: CRNA  Anesthesia Plan Comments:         Anesthesia Quick Evaluation

## 2014-07-25 NOTE — Progress Notes (Signed)
Patient ID: DAYTON SHERR, female   DOB: 02-Dec-1945, 69 y.o.   MRN: [id number] P  Patient with history of HTN was admitted 4/26 with 3 months of exertional dyspnea, weight gain, cough, and orthopnea.  Echo shows EF 20% with diffuse hypokinesis and mildly decreased RV systolic function with large pericardial effusion.  Underwent cath with normal hemdynamics and normal cors.   F/u echo after diuresis showed persistent large pericardial effusion. CT scan chest with small pleural effusion. No malignancy. ESR 5  Meds adjusted yesterday due to low BP. I/Os -1L but weight up 2 pounds. Denies dyspnea.   Scheduled Meds: . aspirin EC  81 mg Oral Daily  . carvedilol  1.562 mg Oral BID WC  . furosemide  40 mg Oral Daily  . heparin  5,000 Units Subcutaneous 3 times per day  . lisinopril  2.5 mg Oral BID  . sodium chloride  3 mL Intravenous Q12H  . spironolactone  12.5 mg Oral Daily   Continuous Infusions:   PRN Meds:.sodium chloride, acetaminophen, ondansetron (ZOFRAN) IV, sodium chloride    Filed Vitals:   07/25/14 0607 07/25/14 0859 07/25/14 0900 07/25/14 1044  BP: _0 98/58  Pulse: 76     Temp: 98.1 F (36.7 C)     TempSrc: Oral     Resp: 18     Height:      Weight: 46.176 kg (101 lb 12.8 oz)     SpO2: 100%       Intake/Output Summary (Last 24 hours) at 07/25/14 1240 Last data filed at 07/25/14 1000  Gross per 24 hour  Intake    840 ml  Output    951 ml  Net   -111 ml    LABS: Basic Metabolic Panel:  Recent Labs  07/23/14 0545 07/24/14 0555  NA 141 137  K 3.6 4.3  CL 103 102  CO2 31 27  GLUCOSE 88 76  BUN 17 24*  CREATININE 0.93 0.98  CALCIUM 8.4 8.2*   Liver Function Tests: No results for input(s): AST, ALT, ALKPHOS, BILITOT, PROT, ALBUMIN in the last 72 hours. No results for input(s): LIPASE, AMYLASE in the last 72 hours. CBC:  Recent Labs  07/23/14 0545 07/24/14 0555  WBC 7.5 6.9  HGB 13.1 12.6  HCT 41.3 38.5  MCV 86.6 85.9  PLT 265  231   Cardiac Enzymes: No results for input(s): CKTOTAL, CKMB, CKMBINDEX, TROPONINI in the last 72 hours. BNP: Invalid input(s): POCBNP D-Dimer: No results for input(s): DDIMER in the last 72 hours. Hemoglobin A1C: No results for input(s): HGBA1C in the last 72 hours. Fasting Lipid Panel: No results for input(s): CHOL, HDL, LDLCALC, TRIG, CHOLHDL, LDLDIRECT in the last 72 hours. Thyroid Function Tests: No results for input(s): TSH, T4TOTAL, T3FREE, THYROIDAB in the last 72 hours.  Invalid input(s): FREET3 Anemia Panel: No results for input(s): VITAMINB12, FOLATE, FERRITIN, TIBC, IRON, RETICCTPCT in the last 72 hours.  RADIOLOGY: Dg Chest 2 View  07/20/2014   CLINICAL DATA:  Short  of breath and weakness for 2 weeks  EXAM: CHEST  2 VIEW  COMPARISON:  None.  FINDINGS: There is moderate cardiac enlargement. Bilateral pleural effusions and interstitial edema is identified. Decreased aeration to lung bases likely represents atelectasis. Mild degenerative disc disease within the thoracic spine.  IMPRESSION: 1. Cardiac enlargement and CHF.   Electronically Signed   By: Kerby Moors M.D.   On: 07/20/2014 19:01   Ct Chest Wo Contrast  07/24/2014   CLINICAL  DATA:  Pericardial effusion.  EXAM: CT CHEST WITHOUT CONTRAST  TECHNIQUE: Multidetector CT imaging of the chest was performed following the standard protocol without IV contrast.  COMPARISON:  Chest x-ray 07/20/2014  FINDINGS: Heart: There is large pericardial effusion. Effusion measures 2.6 cm and appears low attenuation. There is aortic valve calcification. No significant coronary artery calcifications.  Vascular structures: Minimal atherosclerosis of the thoracic aorta. No aneurysm.  Mediastinum/thyroid: The visualized portion of the thyroid gland has a normal appearance. No mediastinal, hilar, or axillary adenopathy.  Lungs/Airways: Airways are patent. There is a right pleural effusion. There are no focal consolidations. No pulmonary edema.  Minimal left lower lobe atelectasis.  Upper abdomen: Unremarkable.  Chest wall/osseous structures: No suspicious lytic or blastic lesions are identified.  IMPRESSION: 1. Large pericardial effusion. 2. Small right pleural effusion.   Electronically Signed   By: Nolon Nations M.D.   On: 07/24/2014 18:24    PHYSICAL EXAM General: NAD Neck: JVP 7 cm, no thyromegaly or thyroid nodule.  Lungs: Decreased breath sounds at bases bilaterally.  CV: Lateral PMI.  Heart regular S1/S2, +S3, no murmur.  1-2+ woody edema at ankles   Abdomen: Soft, nontender, no hepatosplenomegaly, no distention.  Neurologic: Alert and oriented x 3.  Psych: Normal affect. Extremities: No clubbing or cyanosis.   TELEMETRY: Reviewed telemetry pt in NSR  ASSESSMENT AND PLAN: 69 yo with history of HTN was admitted 4/26 with 3 months of exertional dyspnea, weight gain, cough, and orthopnea.  1. Acute/chronic systolic CHF: Patient was admitted with NYHA class IIIb-IV symptoms and significant volume overload with S3 on exam.  EF 20% on echo with diffuse hypokinesis.  She has a history of untreated HTN.  Symptoms have been present since 1/16.  No viral-type syndrome.  No drugs/ETOH.  No family history of cardiomypathy.  TSH normal.  No chest pain.  She had not had her BP checked recently prior to admission, mildly elevated initially in the hospital.  She has diuresed well so far with weight down considerably (21 lbs).  Cath with normal cors.  --SBP soft in 80-90s. Meds adjusted yesterday.  --Weight up slightly today but still down 19 pounds total. Will continue same regimen for now --Place TED hose 2. HTN: BP soft but stable 3. Pericardial effusion: Large. No tamponade. Did not improve with diuresis. CT chest without evidence malignancy. TCTS currently seeing for possible window tomorrow.  Glori Bickers MD 07/25/2014 12:40 PM

## 2014-07-26 ENCOUNTER — Ambulatory Visit (HOSPITAL_COMMUNITY)
Admission: RE | Admit: 2014-07-26 | Discharge: 2014-07-26 | Disposition: A | Discharge status: Home / Self Care | Payer: Medicare Other | Source: Ambulatory Visit

## 2014-07-26 ENCOUNTER — Inpatient Hospital Stay (HOSPITAL_COMMUNITY): Payer: Medicare Other | Admitting: Anesthesiology

## 2014-07-26 ENCOUNTER — Inpatient Hospital Stay (HOSPITAL_COMMUNITY): Payer: Medicare Other

## 2014-07-26 ENCOUNTER — Encounter (HOSPITAL_COMMUNITY): Payer: Self-pay | Admitting: Anesthesiology

## 2014-07-26 ENCOUNTER — Encounter (HOSPITAL_COMMUNITY)
Admission: EM | Disposition: A | Discharge status: Home Health | Payer: Self-pay | Source: Home / Self Care | Attending: Cardiovascular Disease

## 2014-07-26 DIAGNOSIS — I313 Pericardial effusion (noninflammatory): Secondary | ICD-10-CM

## 2014-07-26 DIAGNOSIS — I3139 Other pericardial effusion (noninflammatory): Secondary | ICD-10-CM

## 2014-07-26 HISTORY — PX: TEE WITHOUT CARDIOVERSION: SHX5443

## 2014-07-26 HISTORY — PX: SUBXYPHOID PERICARDIAL WINDOW: SHX5075

## 2014-07-26 HISTORY — PX: PLEURAL EFFUSION DRAINAGE: SHX5099

## 2014-07-26 LAB — POCT I-STAT 3, ART BLOOD GAS (G3+)
Acid-base deficit: 1 mmol/L (ref 0.0–2.0)
BICARBONATE: 24.6 meq/L — AB (ref 20.0–24.0)
O2 Saturation: 95 %
PCO2 ART: 42 mmHg (ref 35.0–45.0)
TCO2: 26 mmol/L (ref 0–100)
pH, Arterial: 7.376 (ref 7.350–7.450)
pO2, Arterial: 80 mmHg (ref 80.0–100.0)

## 2014-07-26 LAB — BASIC METABOLIC PANEL
Anion gap: 6 (ref 5–15)
BUN: 16 mg/dL (ref 6–20)
CALCIUM: 8.4 mg/dL — AB (ref 8.9–10.3)
CO2: 26 mmol/L (ref 22–32)
Chloride: 104 mmol/L (ref 101–111)
Creatinine, Ser: 0.85 mg/dL (ref 0.44–1.00)
GFR calc non Af Amer: >60 mL/min (ref >60)
Glucose, Bld: 129 mg/dL — ABNORMAL HIGH (ref 70–99)
POTASSIUM: 4.6 mmol/L (ref 3.5–5.1)
Sodium: 136 mmol/L (ref 135–145)

## 2014-07-26 LAB — POCT I-STAT 3, VENOUS BLOOD GAS (G3P V)
Acid-Base Excess: 1 mmol/L (ref 0.0–2.0)
BICARBONATE: 27.1 meq/L — AB (ref 20.0–24.0)
O2 SAT: 73 %
PH VEN: 7.381 — AB (ref 7.250–7.300)
PO2 VEN: 40 mmHg (ref 30.0–45.0)
TCO2: 28 mmol/L (ref 0–100)
pCO2, Ven: 45.7 mmHg (ref 45.0–50.0)

## 2014-07-26 LAB — LACTATE DEHYDROGENASE, PLEURAL OR PERITONEAL FLUID: LD, Fluid: 206 U/L — ABNORMAL HIGH (ref 3–23)

## 2014-07-26 LAB — PROTEIN, PERICARDIAL FLUID: Protein, Pericardial Fluid: 3.8 g/dL

## 2014-07-26 LAB — BODY FLUID CELL COUNT WITH DIFFERENTIAL
Lymphs, Fluid: 27 %
Monocyte-Macrophage-Serous Fluid: 73 % (ref 50–90)
Total Nucleated Cell Count, Fluid: 743 cu mm (ref 0–1000)

## 2014-07-26 LAB — SURGICAL PCR SCREEN
MRSA, PCR: NEGATIVE
Staphylococcus aureus: POSITIVE — AB

## 2014-07-26 LAB — GLUCOSE, SEROUS FLUID: Glucose, Fluid: 89 mg/dL

## 2014-07-26 SURGERY — CREATION, PERICARDIAL WINDOW, SUBXIPHOID APPROACH
Anesthesia: General | Site: Chest

## 2014-07-26 MED ORDER — LISINOPRIL 5 MG PO TABS
5.0000 mg | ORAL_TABLET | Freq: Two times a day (BID) | ORAL | Status: DC
  Administered 2014-07-26: 5 mg via ORAL
  Filled 2014-07-26 (×4): qty 1

## 2014-07-26 MED ORDER — DEXTROSE 5 % IV SOLN
1.5000 g | Freq: Two times a day (BID) | INTRAVENOUS | Status: AC
  Administered 2014-07-26 – 2014-07-27 (×2): 1.5 g via INTRAVENOUS
  Filled 2014-07-26 (×2): qty 1.5

## 2014-07-26 MED ORDER — MEPERIDINE HCL 25 MG/ML IJ SOLN: 6.2500 mg | INTRAMUSCULAR | Status: DC | PRN

## 2014-07-26 MED ORDER — MIDAZOLAM HCL 5 MG/5ML IJ SOLN
INTRAMUSCULAR | Status: DC | PRN
  Administered 2014-07-26: 1 mg via INTRAVENOUS

## 2014-07-26 MED ORDER — HYDROMORPHONE HCL 1 MG/ML IJ SOLN: 0.2500 mg | INTRAMUSCULAR | Status: DC | PRN

## 2014-07-26 MED ORDER — ONDANSETRON HCL 4 MG/2ML IJ SOLN: 4.0000 mg | Freq: Four times a day (QID) | INTRAMUSCULAR | Status: DC | PRN

## 2014-07-26 MED ORDER — SUCCINYLCHOLINE CHLORIDE 20 MG/ML IJ SOLN
INTRAMUSCULAR | Status: AC
  Filled 2014-07-26: qty 1

## 2014-07-26 MED ORDER — DEXTROSE 5 % IV SOLN
INTRAVENOUS | Status: AC
  Filled 2014-07-26: qty 1.5

## 2014-07-26 MED ORDER — TRAMADOL HCL 50 MG PO TABS: 50.0000 mg | ORAL_TABLET | Freq: Four times a day (QID) | ORAL | Status: DC | PRN

## 2014-07-26 MED ORDER — LIDOCAINE HCL (CARDIAC) 20 MG/ML IV SOLN
INTRAVENOUS | Status: DC | PRN
  Administered 2014-07-26: 100 mg via INTRAVENOUS

## 2014-07-26 MED ORDER — LACTATED RINGERS IV SOLN
INTRAVENOUS | Status: DC | PRN
  Administered 2014-07-26: 07:00:00 via INTRAVENOUS

## 2014-07-26 MED ORDER — GLYCOPYRROLATE 0.2 MG/ML IJ SOLN
INTRAMUSCULAR | Status: AC
  Filled 2014-07-26: qty 3

## 2014-07-26 MED ORDER — 0.9 % SODIUM CHLORIDE (POUR BTL) OPTIME
TOPICAL | Status: DC | PRN
  Administered 2014-07-26: 2000 mL

## 2014-07-26 MED ORDER — ARTIFICIAL TEARS OP OINT
TOPICAL_OINTMENT | OPHTHALMIC | Status: DC | PRN
  Administered 2014-07-26: 1 via OPHTHALMIC

## 2014-07-26 MED ORDER — ETOMIDATE 2 MG/ML IV SOLN
INTRAVENOUS | Status: AC
  Filled 2014-07-26: qty 10

## 2014-07-26 MED ORDER — SODIUM CHLORIDE 0.9 % IJ SOLN
INTRAMUSCULAR | Status: AC
  Filled 2014-07-26: qty 10

## 2014-07-26 MED ORDER — FENTANYL CITRATE (PF) 100 MCG/2ML IJ SOLN
INTRAMUSCULAR | Status: DC | PRN
  Administered 2014-07-26 (×3): 50 ug via INTRAVENOUS

## 2014-07-26 MED ORDER — POTASSIUM CHLORIDE 10 MEQ/50ML IV SOLN
10.0000 meq | Freq: Every day | INTRAVENOUS | Status: DC | PRN
Start: 2014-07-26 — End: 2014-07-27

## 2014-07-26 MED ORDER — ROCURONIUM BROMIDE 100 MG/10ML IV SOLN
INTRAVENOUS | Status: DC | PRN
  Administered 2014-07-26: 30 mg via INTRAVENOUS

## 2014-07-26 MED ORDER — ONDANSETRON HCL 4 MG/2ML IJ SOLN
INTRAMUSCULAR | Status: AC
  Filled 2014-07-26: qty 2

## 2014-07-26 MED ORDER — SENNOSIDES-DOCUSATE SODIUM 8.6-50 MG PO TABS
1.0000 | ORAL_TABLET | Freq: Every day | ORAL | Status: DC
  Administered 2014-07-27 – 2014-07-29 (×3): 1 via ORAL
  Filled 2014-07-26 (×5): qty 1

## 2014-07-26 MED ORDER — LIDOCAINE HCL (CARDIAC) 20 MG/ML IV SOLN
INTRAVENOUS | Status: AC
  Filled 2014-07-26: qty 5

## 2014-07-26 MED ORDER — NEOSTIGMINE METHYLSULFATE 10 MG/10ML IV SOLN
INTRAVENOUS | Status: AC
  Filled 2014-07-26: qty 1

## 2014-07-26 MED ORDER — SODIUM CHLORIDE 0.9 % IV SOLN
10.0000 mg | INTRAVENOUS | Status: DC | PRN
  Administered 2014-07-26: 25 ug/min via INTRAVENOUS

## 2014-07-26 MED ORDER — CARVEDILOL 3.125 MG PO TABS
3.1250 mg | ORAL_TABLET | Freq: Two times a day (BID) | ORAL | Status: DC
  Administered 2014-07-26 – 2014-07-30 (×8): 3.125 mg via ORAL
  Filled 2014-07-26 (×10): qty 1

## 2014-07-26 MED ORDER — KCL IN DEXTROSE-NACL 20-5-0.45 MEQ/L-%-% IV SOLN
INTRAVENOUS | Status: DC
  Filled 2014-07-26 (×2): qty 1000

## 2014-07-26 MED ORDER — PROMETHAZINE HCL 25 MG/ML IJ SOLN: 6.2500 mg | INTRAMUSCULAR | Status: DC | PRN

## 2014-07-26 MED ORDER — ACETAMINOPHEN 500 MG PO TABS
1000.0000 mg | ORAL_TABLET | Freq: Four times a day (QID) | ORAL | Status: DC
  Filled 2014-07-26 (×4): qty 2

## 2014-07-26 MED ORDER — PROPOFOL 10 MG/ML IV BOLUS
INTRAVENOUS | Status: AC
  Filled 2014-07-26: qty 20

## 2014-07-26 MED ORDER — FENTANYL CITRATE (PF) 250 MCG/5ML IJ SOLN
INTRAMUSCULAR | Status: AC
  Filled 2014-07-26: qty 5

## 2014-07-26 MED ORDER — EPHEDRINE SULFATE 50 MG/ML IJ SOLN
INTRAMUSCULAR | Status: AC
  Filled 2014-07-26: qty 1

## 2014-07-26 MED ORDER — PROPOFOL 10 MG/ML IV BOLUS
INTRAVENOUS | Status: DC | PRN
  Administered 2014-07-26: 100 mg via INTRAVENOUS

## 2014-07-26 MED ORDER — LACTATED RINGERS IV SOLN
INTRAVENOUS | Status: DC | PRN
  Administered 2014-07-26 (×3): via INTRAVENOUS

## 2014-07-26 MED ORDER — BISACODYL 5 MG PO TBEC
10.0000 mg | DELAYED_RELEASE_TABLET | Freq: Every day | ORAL | Status: DC
  Administered 2014-07-27: 10 mg via ORAL
  Filled 2014-07-26 (×2): qty 2

## 2014-07-26 MED ORDER — ONDANSETRON HCL 4 MG/2ML IJ SOLN
INTRAMUSCULAR | Status: DC | PRN
  Administered 2014-07-26: 4 mg via INTRAVENOUS

## 2014-07-26 MED ORDER — ACETAMINOPHEN 160 MG/5ML PO SOLN
1000.0000 mg | Freq: Four times a day (QID) | ORAL | Status: DC
  Administered 2014-07-26: 1000 mg via ORAL
  Filled 2014-07-26 (×8): qty 40

## 2014-07-26 MED ORDER — ROCURONIUM BROMIDE 50 MG/5ML IV SOLN
INTRAVENOUS | Status: AC
  Filled 2014-07-26: qty 1

## 2014-07-26 MED ORDER — NEOSTIGMINE METHYLSULFATE 10 MG/10ML IV SOLN
INTRAVENOUS | Status: DC | PRN
Start: 2014-07-26 — End: 2014-07-26
  Administered 2014-07-26: 3 mg via INTRAVENOUS

## 2014-07-26 MED ORDER — ARTIFICIAL TEARS OP OINT
TOPICAL_OINTMENT | OPHTHALMIC | Status: AC
  Filled 2014-07-26: qty 3.5

## 2014-07-26 MED ORDER — OXYCODONE HCL 5 MG PO TABS
5.0000 mg | ORAL_TABLET | ORAL | Status: DC | PRN
  Administered 2014-07-26: 10 mg via ORAL
  Administered 2014-07-26 – 2014-07-27 (×2): 5 mg via ORAL
  Administered 2014-07-27: 10 mg via ORAL
  Filled 2014-07-26: qty 1
  Filled 2014-07-26 (×2): qty 2
  Filled 2014-07-26: qty 1

## 2014-07-26 MED ORDER — FENTANYL CITRATE (PF) 100 MCG/2ML IJ SOLN
25.0000 ug | INTRAMUSCULAR | Status: DC | PRN
  Administered 2014-07-26 (×3): 50 ug via INTRAVENOUS
  Filled 2014-07-26 (×3): qty 2

## 2014-07-26 MED ORDER — MIDAZOLAM HCL 2 MG/2ML IJ SOLN
INTRAMUSCULAR | Status: AC
  Filled 2014-07-26: qty 2

## 2014-07-26 MED ORDER — GLYCOPYRROLATE 0.2 MG/ML IJ SOLN
INTRAMUSCULAR | Status: DC | PRN
  Administered 2014-07-26: 0.6 mg via INTRAVENOUS

## 2014-07-26 MED FILL — Verapamil HCl IV Soln 2.5 MG/ML: INTRAVENOUS | Qty: 2 | Status: AC

## 2014-07-26 MED FILL — Heparin Sodium (Porcine) Inj 1000 Unit/ML: INTRAMUSCULAR | Qty: 10 | Status: AC

## 2014-07-26 MED FILL — Heparin Sodium (Porcine) 2 Unit/ML in Sodium Chloride 0.9%: INTRAMUSCULAR | Qty: 1000 | Status: AC

## 2014-07-26 MED FILL — Lidocaine HCl Local Preservative Free (PF) Inj 1%: INTRAMUSCULAR | Qty: 30 | Status: AC

## 2014-07-26 MED FILL — Fentanyl Citrate Preservative Free (PF) Inj 100 MCG/2ML: INTRAMUSCULAR | Qty: 2 | Status: AC

## 2014-07-26 MED FILL — Midazolam HCl Inj 2 MG/2ML (Base Equivalent): INTRAMUSCULAR | Qty: 2 | Status: AC

## 2014-07-26 SURGICAL SUPPLY — 49 items
ADH SKN CLS APL DERMABOND .7 (GAUZE/BANDAGES/DRESSINGS) ×2
APL SKNCLS STERI-STRIP NONHPOA (GAUZE/BANDAGES/DRESSINGS)
BENZOIN TINCTURE PRP APPL 2/3 (GAUZE/BANDAGES/DRESSINGS) IMPLANT
CANISTER SUCTION 2500CC (MISCELLANEOUS) ×3 IMPLANT
CATH THORACIC 28FR (CATHETERS) IMPLANT
CATH THORACIC 28FR RT ANG (CATHETERS) IMPLANT
CATH THORACIC 36FR (CATHETERS) IMPLANT
CATH THORACIC 36FR RT ANG (CATHETERS) IMPLANT
CLIP TI MEDIUM 24 (CLIP) ×1 IMPLANT
CLIP TI WIDE RED SMALL 24 (CLIP) ×1 IMPLANT
CONN ST 1/4X3/8  BEN (MISCELLANEOUS) ×2
CONN ST 1/4X3/8 BEN (MISCELLANEOUS) ×2 IMPLANT
CONT SPEC 4OZ CLIKSEAL STRL BL (MISCELLANEOUS) ×7 IMPLANT
COVER SURGICAL LIGHT HANDLE (MISCELLANEOUS) ×3 IMPLANT
DERMABOND ADVANCED (GAUZE/BANDAGES/DRESSINGS) ×1
DERMABOND ADVANCED .7 DNX12 (GAUZE/BANDAGES/DRESSINGS) ×2 IMPLANT
DRAIN CHANNEL 28F RND 3/8 FF (WOUND CARE) IMPLANT
DRAPE LAPAROSCOPIC ABDOMINAL (DRAPES) ×3 IMPLANT
ELECT BLADE 6.5 EXT (BLADE) ×1 IMPLANT
ELECT REM PT RETURN 9FT ADLT (ELECTROSURGICAL) ×3
ELECTRODE REM PT RTRN 9FT ADLT (ELECTROSURGICAL) ×2 IMPLANT
GAUZE SPONGE 4X4 12PLY STRL (GAUZE/BANDAGES/DRESSINGS) IMPLANT
GLOVE BIO SURGEON STRL SZ 6.5 (GLOVE) ×6 IMPLANT
HEMOSTAT POWDER SURGIFOAM 1G (HEMOSTASIS) IMPLANT
KIT BASIN OR (CUSTOM PROCEDURE TRAY) ×3 IMPLANT
KIT ROOM TURNOVER OR (KITS) ×3 IMPLANT
NDL HYPO 18GX1.5 BLUNT FILL (NEEDLE) IMPLANT
NEEDLE HYPO 18GX1.5 BLUNT FILL (NEEDLE) ×3 IMPLANT
NS IRRIG 1000ML POUR BTL (IV SOLUTION) ×6 IMPLANT
PACK CHEST (CUSTOM PROCEDURE TRAY) ×3 IMPLANT
PAD ARMBOARD 7.5X6 YLW CONV (MISCELLANEOUS) ×6 IMPLANT
PAD ELECT DEFIB RADIOL ZOLL (MISCELLANEOUS) ×3 IMPLANT
STRIP CLOSURE SKIN 1/2X4 (GAUZE/BANDAGES/DRESSINGS) IMPLANT
SUT SILK  1 MH (SUTURE) ×1
SUT SILK 1 MH (SUTURE) IMPLANT
SUT SILK 2 0 SH CR/8 (SUTURE) ×1 IMPLANT
SUT VIC AB 1 CTX 18 (SUTURE) ×4 IMPLANT
SUT VIC AB 2-0 CTX 27 (SUTURE) ×4 IMPLANT
SUT VIC AB 3-0 X1 27 (SUTURE) ×3 IMPLANT
SWAB COLLECTION DEVICE MRSA (MISCELLANEOUS) IMPLANT
SYR 50ML SLIP (SYRINGE) IMPLANT
SYRINGE 10CC LL (SYRINGE) ×1 IMPLANT
SYSTEM SAHARA CHEST DRAIN ATS (WOUND CARE) ×3 IMPLANT
TOWEL OR 17X24 6PK STRL BLUE (TOWEL DISPOSABLE) ×3 IMPLANT
TOWEL OR 17X26 10 PK STRL BLUE (TOWEL DISPOSABLE) ×3 IMPLANT
TRAP SPECIMEN MUCOUS 40CC (MISCELLANEOUS) ×9 IMPLANT
TRAY FOLEY CATH 14FRSI W/METER (CATHETERS) ×3 IMPLANT
TUBE ANAEROBIC SPECIMEN COL (MISCELLANEOUS) IMPLANT
WATER STERILE IRR 1000ML POUR (IV SOLUTION) ×6 IMPLANT

## 2014-07-26 NOTE — Progress Notes (Signed)
Patient ID: Katrina Peck, female   DOB: [birth year], 69 y.o.   MRN: [id number] PP  Patient with history of HTN was admitted 4/26 with 3 months of exertional dyspnea, weight gain, cough, and orthopnea.  Echo shows EF 20% with diffuse hypokinesis and mildly decreased RV systolic function with large pericardial effusion.  Underwent cath with normal hemdynamics and normal cors.   F/u echo after diuresis showed persistent large pericardial effusion. CT scan chest with small pleural effusion. No malignancy. ESR 5  Today, she went for subxiphoid pericardial window.  BP now higher, SBP 140s-150s (also in pain at surgical site).   Scheduled Meds: . acetaminophen  1,000 mg Oral 4 times per day   Or  . acetaminophen (TYLENOL) oral liquid 160 mg/5 mL  1,000 mg Oral 4 times per day  . aspirin EC  81 mg Oral Daily  . bisacodyl  10 mg Oral Daily  . carvedilol  3.125 mg Oral BID WC  . cefUROXime (ZINACEF)  IV  1.5 g Intravenous Q12H  . cefUROXime (ZINACEF) 1.5 GM IVPB      . furosemide  40 mg Oral Daily  . lisinopril  5 mg Oral BID  . senna-docusate  1 tablet Oral QHS  . spironolactone  12.5 mg Oral Daily   Continuous Infusions:   PRN Meds:.fentaNYL (SUBLIMAZE) injection, ondansetron (ZOFRAN) IV, oxyCODONE, potassium chloride, traMADol    Filed Vitals:   07/26/14 1007 07/26/14 1030 07/26/14 1045 07/26/14 1100  BP: 133/79   141/87  Pulse: 85 85 83 85  Temp:   98 F (36.7 C)   TempSrc:   Oral   Resp: 14 19 14 14   Height:      Weight:      SpO2: 100% 99% 99% 97%    Intake/Output Summary (Last 24 hours) at 07/26/14 1120 Last data filed at 07/26/14 0835  Gross per 24 hour  Intake   1790 ml  Output   2120 ml  Net   -330 ml    LABS: Basic Metabolic Panel:  Recent Labs  07/24/14 0555 07/25/14 1857  NA 137 138  K 4.3 4.7  CL 102 102  CO2 27 28  GLUCOSE 76 105*  BUN 24* 21*  CREATININE 0.98 1.27*  CALCIUM 8.2* 8.8*   Liver Function Tests:  Recent Labs  07/25/14 1857  AST  32  ALT 29  ALKPHOS 95  BILITOT 0.8  PROT 6.5  ALBUMIN 2.8*   No results for input(s): LIPASE, AMYLASE in the last 72 hours. CBC:  Recent Labs  07/24/14 0555 07/25/14 1857  WBC 6.9 6.9  HGB 12.6 12.2  HCT 38.5 38.2  MCV 85.9 86.6  PLT 231 234   Cardiac Enzymes: No results for input(s): CKTOTAL, CKMB, CKMBINDEX, TROPONINI in the last 72 hours. BNP: Invalid input(s): POCBNP D-Dimer: No results for input(s): DDIMER in the last 72 hours. Hemoglobin A1C: No results for input(s): HGBA1C in the last 72 hours. Fasting Lipid Panel: No results for input(s): CHOL, HDL, LDLCALC, TRIG, CHOLHDL, LDLDIRECT in the last 72 hours. Thyroid Function Tests: No results for input(s): TSH, T4TOTAL, T3FREE, THYROIDAB in the last 72 hours.  Invalid input(s): FREET3 Anemia Panel: No results for input(s): VITAMINB12, FOLATE, FERRITIN, TIBC, IRON, RETICCTPCT in the last 72 hours.  RADIOLOGY: Dg Chest 2 View  07/25/2014   CLINICAL DATA:  Preoperative cardiovascular examination chest x-ray history of congestive heart failure hypertension pericardial effusion  EXAM: CHEST  2 VIEW  COMPARISON:  07/24/2014  FINDINGS: Severe  enlargement of cardiac silhouette consistent with known large pericardial effusion. The vascular pattern is normal.  There is an element of hyperinflation suggesting COPD. Known small right pleural effusion. Bony thorax intact.  IMPRESSION: Severe cardiac silhouette enlargement consistent with known pericardial effusion.   Electronically Signed   By: Skipper Cliche M.D.   On: 07/25/2014 17:58   Dg Chest 2 View  07/20/2014   CLINICAL DATA:  Short  of breath and weakness for 2 weeks  EXAM: CHEST  2 VIEW  COMPARISON:  None.  FINDINGS: There is moderate cardiac enlargement. Bilateral pleural effusions and interstitial edema is identified. Decreased aeration to lung bases likely represents atelectasis. Mild degenerative disc disease within the thoracic spine.  IMPRESSION: 1. Cardiac  enlargement and CHF.   Electronically Signed   By: Kerby Moors M.D.   On: 07/20/2014 19:01   Ct Chest Wo Contrast  07/24/2014   CLINICAL DATA:  Pericardial effusion.  EXAM: CT CHEST WITHOUT CONTRAST  TECHNIQUE: Multidetector CT imaging of the chest was performed following the standard protocol without IV contrast.  COMPARISON:  Chest x-ray 07/20/2014  FINDINGS: Heart: There is large pericardial effusion. Effusion measures 2.6 cm and appears low attenuation. There is aortic valve calcification. No significant coronary artery calcifications.  Vascular structures: Minimal atherosclerosis of the thoracic aorta. No aneurysm.  Mediastinum/thyroid: The visualized portion of the thyroid gland has a normal appearance. No mediastinal, hilar, or axillary adenopathy.  Lungs/Airways: Airways are patent. There is a right pleural effusion. There are no focal consolidations. No pulmonary edema. Minimal left lower lobe atelectasis.  Upper abdomen: Unremarkable.  Chest wall/osseous structures: No suspicious lytic or blastic lesions are identified.  IMPRESSION: 1. Large pericardial effusion. 2. Small right pleural effusion.   Electronically Signed   By: Nolon Nations M.D.   On: 07/24/2014 18:24   Dg Chest Port 1 View  07/26/2014   CLINICAL DATA:  Congestive heart failure .  EXAM: PORTABLE CHEST - 1 VIEW  COMPARISON:  07/25/2014.  FINDINGS: Right IJ line in stable position. Surgical tubing noted over the lower chest. Cardiomegaly with pulmonary vascular prominence and diffuse interstitial prominence consistent congestive heart failure. Findings have progressed slightly from prior exam. Small bilateral effusions.  IMPRESSION: 1. Right IJ line noted with tip projected over the cavoatrial junction.  2. Congestive heart failure with pulmonary interstitial edema and small pleural effusions. Changes of congestive heart failure have progressed from prior exam .   Electronically Signed   By: Graham   On: 07/26/2014 09:40      PHYSICAL EXAM General: NAD Neck: JVP 8 cm, no thyromegaly or thyroid nodule.  Lungs: Decreased breath sounds at bases bilaterally.  CV: Lateral PMI.  Heart regular S1/S2, +S3, no murmur.  1-2+ woody edema at ankles   Abdomen: Soft, nontender, no hepatosplenomegaly, no distention.  Neurologic: Alert and oriented x 3.  Psych: Normal affect. Extremities: No clubbing or cyanosis.   TELEMETRY: Reviewed telemetry pt in NSR  ASSESSMENT AND PLAN: 69 yo with history of HTN was admitted 4/26 with 3 months of exertional dyspnea, weight gain, cough, and orthopnea.  1. Acute/chronic systolic CHF: Patient was admitted with NYHA class IIIb-IV symptoms and significant volume overload with S3 on exam.  EF 20% on echo with diffuse hypokinesis.  She has a history of untreated HTN.  Symptoms have been present since 1/16.  No viral-type syndrome.  No drugs/ETOH.  No family history of cardiomypathy.  TSH normal.  No chest pain.  She had  not had her BP checked recently prior to admission, mildly elevated initially in the hospital.  She diuresed well with weight down considerably (21 lbs).  Cath with normal cors.  - BP better s/p subxiphoid pericardial window.  Can increase Coreg to 3.125 mg bid and lisinopril to 5 mg bid.  - Continue po Lasix and current spironolactone.  - Needs BMET today.  2. HTN: BP higher post-window.  3. Pericardial effusion: Large. No tamponade by echo or cath. Did not improve with diuresis. CT chest without evidence malignancy. She had window today, samples sent for path and fluid for cytology.  BP better post-window.   Loralie Champagne MD 07/26/2014 11:20 AM

## 2014-07-26 NOTE — Anesthesia Postprocedure Evaluation (Signed)
Anesthesia Post Note  Patient: Katrina Peck  Procedure(s) Performed: Procedure(s) (LRB): SUBXYPHOID PERICARDIAL WINDOW (N/A) TRANSESOPHAGEAL ECHOCARDIOGRAM (TEE) (N/A) DRAINAGE OF PERICARDIAL EFFUSION (N/A)  Anesthesia type: General  Patient location: PACU  Post pain: Pain level controlled  Post assessment: Post-op Vital signs reviewed  Last Vitals: BP 137/101 mmHg  Pulse 91  Temp(Src) 36.7 C (Oral)  Resp 13  Ht 5\' 3"  (1.6 m)  Wt 101 lb 3.2 oz (45.904 kg)  BMI 17.93 kg/m2  SpO2 97%  Post vital signs: Reviewed  Level of consciousness: sedated  Complications: No apparent anesthesia complications

## 2014-07-26 NOTE — Anesthesia Procedure Notes (Addendum)
Anesthesia Procedure Note CVP: Timeout, sterile prep, drape, FBP R neck.  Trendelenburg position.  1% lido local, finder and trocar RIJ 1st pass with US guidance.  2 lumen placed over J wire. Biopatch and sterile dressing on.  Patient tolerated well.  VSS.  Jenita Seashore, MD [phone] Procedure Name: Intubation Date/Time: 07/26/2014 7:45 AM Performed by: Neldon Newport Pre-anesthesia Checklist: Patient being monitored, Suction available, Emergency Drugs available, Patient identified and Timeout performed Patient Re-evaluated:Patient Re-evaluated prior to inductionOxygen Delivery Method: Circle system utilized Preoxygenation: Pre-oxygenation with 100% oxygen Intubation Type: IV induction Ventilation: Mask ventilation without difficulty Laryngoscope Size: Mac and 3 Grade View: Grade I Tube type: Oral Tube size: 7.5 mm Number of attempts: 2 Placement Confirmation: positive ETCO2,  ETT inserted through vocal cords under direct vision and breath sounds checked- equal and bilateral Secured at: 21 cm Tube secured with: Tape Dental Injury: Teeth and Oropharynx as per pre-operative assessment

## 2014-07-26 NOTE — Transfer of Care (Signed)
Immediate Anesthesia Transfer of Care Note  Patient: Katrina Peck  Procedure(s) Performed: Procedure(s): SUBXYPHOID PERICARDIAL WINDOW (N/A) TRANSESOPHAGEAL ECHOCARDIOGRAM (TEE) (N/A) DRAINAGE OF PERICARDIAL EFFUSION (N/A)  Patient Location: PACU  Anesthesia Type:General  Level of Consciousness: awake, alert  and oriented  Airway & Oxygen Therapy: Patient Spontanous Breathing and Patient connected to face mask oxygen  Post-op Assessment: Report given to RN, Post -op Vital signs reviewed and stable and Patient moving all extremities X 4  Post vital signs: Reviewed and stable  Last Vitals:  Filed Vitals:   07/26/14 0609  BP: 103/59  Pulse: 68  Temp: 36.7 C  Resp: 22    Complications: No apparent anesthesia complications

## 2014-07-26 NOTE — Brief Op Note (Signed)
07/20/2014 - 07/26/2014  8:45 AM  PATIENT:  Katrina Peck  69 y.o. female  PRE-OPERATIVE DIAGNOSIS:  PERICARDIAL EFFUSION  POST-OPERATIVE DIAGNOSIS:  PERICARDIAL EFFUSION  PROCEDURE:   SUBXYPHOID PERICARDIAL WINDOW  SURGEON:  Delight Ovens, MD  ASSISTANT: Coral Ceo, PA-C   ANESTHESIA:   general  SPECIMEN:  Source of Specimen:  pericardial fluid, pericardium, mediastinal lymph node  DISPOSITION OF SPECIMEN:  Pathology  DRAINS: 28 Blake drain  PATIENT CONDITION:  PACU - hemodynamically stable.

## 2014-07-27 ENCOUNTER — Inpatient Hospital Stay (HOSPITAL_COMMUNITY): Payer: Medicare Other

## 2014-07-27 ENCOUNTER — Encounter (HOSPITAL_COMMUNITY): Payer: Self-pay | Admitting: Cardiothoracic Surgery

## 2014-07-27 LAB — BASIC METABOLIC PANEL
Anion gap: 9 (ref 5–15)
Anion gap: 9 (ref 5–15)
BUN: 17 mg/dL (ref 6–20)
BUN: 25 mg/dL — ABNORMAL HIGH (ref 6–20)
CALCIUM: 8.5 mg/dL — AB (ref 8.9–10.3)
CALCIUM: 8.2 mg/dL — AB (ref 8.9–10.3)
CO2: 24 mmol/L (ref 22–32)
CO2: 27 mmol/L (ref 22–32)
CREATININE: 0.97 mg/dL (ref 0.44–1.00)
CREATININE: 1.09 mg/dL — AB (ref 0.44–1.00)
Chloride: 102 mmol/L (ref 101–111)
Chloride: 98 mmol/L — ABNORMAL LOW (ref 101–111)
GFR calc Af Amer: >60 mL/min (ref >60)
GFR calc Af Amer: 59 mL/min — ABNORMAL LOW (ref >60)
GFR calc non Af Amer: 59 mL/min — ABNORMAL LOW (ref >60)
GFR calc non Af Amer: 51 mL/min — ABNORMAL LOW (ref >60)
GLUCOSE: 90 mg/dL (ref 70–99)
GLUCOSE: 214 mg/dL — AB (ref 70–99)
Potassium: 5.2 mmol/L — ABNORMAL HIGH (ref 3.5–5.1)
Potassium: 4.3 mmol/L (ref 3.5–5.1)
Sodium: 135 mmol/L (ref 135–145)
Sodium: 134 mmol/L — ABNORMAL LOW (ref 135–145)

## 2014-07-27 LAB — CBC
HCT: 37.9 % (ref 36.0–46.0)
Hemoglobin: 12.4 g/dL (ref 12.0–15.0)
MCH: 28.1 pg (ref 26.0–34.0)
MCHC: 32.7 g/dL (ref 30.0–36.0)
MCV: 85.7 fL (ref 78.0–100.0)
PLATELETS: 267 10*3/uL (ref 150–400)
RBC: 4.42 MIL/uL (ref 3.87–5.11)
RDW: 14.9 % (ref 11.5–15.5)
WBC: 10.2 10*3/uL (ref 4.0–10.5)

## 2014-07-27 LAB — BLOOD GAS, ARTERIAL
Acid-Base Excess: 1.2 mmol/L (ref 0.0–2.0)
Bicarbonate: 25 mEq/L — ABNORMAL HIGH (ref 20.0–24.0)
Drawn by: 312761
O2 Saturation: 94 %
PATIENT TEMPERATURE: 98.6
PCO2 ART: 38.4 mmHg (ref 35.0–45.0)
PH ART: 7.43 (ref 7.350–7.450)
TCO2: 26.2 mmol/L (ref 0–100)
pO2, Arterial: 72.9 mmHg — ABNORMAL LOW (ref 80.0–100.0)

## 2014-07-27 LAB — PHOSPHORUS: PHOSPHORUS: 3.2 mg/dL (ref 2.5–4.6)

## 2014-07-27 LAB — MAGNESIUM: Magnesium: 1.8 mg/dL (ref 1.7–2.4)

## 2014-07-27 LAB — ANTI-SCLERODERMA ANTIBODY: SCLERODERMA (SCL-70) (ENA) ANTIBODY, IGG: NEGATIVE

## 2014-07-27 LAB — PATHOLOGIST SMEAR REVIEW: Path Review: REACTIVE

## 2014-07-27 MED ORDER — AMIODARONE HCL IN DEXTROSE 360-4.14 MG/200ML-% IV SOLN
60.0000 mg/h | INTRAVENOUS | Status: AC
  Administered 2014-07-27 – 2014-07-28 (×2): 60 mg/h via INTRAVENOUS
  Filled 2014-07-27: qty 200

## 2014-07-27 MED ORDER — AMIODARONE LOAD VIA INFUSION
150.0000 mg | Freq: Once | INTRAVENOUS | Status: AC
  Administered 2014-07-27: 150 mg via INTRAVENOUS

## 2014-07-27 MED ORDER — AMIODARONE HCL IN DEXTROSE 360-4.14 MG/200ML-% IV SOLN
30.0000 mg/h | INTRAVENOUS | Status: DC
  Filled 2014-07-27: qty 200

## 2014-07-27 MED ORDER — LISINOPRIL 2.5 MG PO TABS
2.5000 mg | ORAL_TABLET | Freq: Two times a day (BID) | ORAL | Status: DC
  Administered 2014-07-27 – 2014-07-30 (×6): 2.5 mg via ORAL
  Filled 2014-07-27 (×9): qty 1

## 2014-07-27 MED ORDER — ACETAMINOPHEN 160 MG/5ML PO SOLN
500.0000 mg | Freq: Four times a day (QID) | ORAL | Status: DC
  Filled 2014-07-27 (×16): qty 20

## 2014-07-27 MED ORDER — AMIODARONE HCL IN DEXTROSE 360-4.14 MG/200ML-% IV SOLN
INTRAVENOUS | Status: AC
  Filled 2014-07-27: qty 200

## 2014-07-27 MED ORDER — ACETAMINOPHEN 500 MG PO TABS
500.0000 mg | ORAL_TABLET | Freq: Four times a day (QID) | ORAL | Status: DC
  Administered 2014-07-27 – 2014-07-30 (×13): 500 mg via ORAL
  Filled 2014-07-27 (×16): qty 1

## 2014-07-27 NOTE — Progress Notes (Signed)
Pt went into Afib RVR 130-150s, EKG done and MD called. Orders placed.

## 2014-07-27 NOTE — Progress Notes (Addendum)
[facility] DAILY ICU PROGRESS NOTE                   [address]            [place] [id number]          [phone]   1 Day Post-Op Procedure(s) (LRB): SUBXYPHOID PERICARDIAL WINDOW (N/A) TRANSESOPHAGEAL ECHOCARDIOGRAM (TEE) (N/A) DRAINAGE OF PERICARDIAL EFFUSION (N/A)  Total Length of Stay:  LOS: 7 days   Subjective: Having a lot of soreness from the chest tube.  Breathing stable.   Objective: Vital signs in last 24 hours: Temp:  [97.5 F (36.4 C)-98.9 F (37.2 C)] 98.9 F (37.2 C) (05/03 0400) Pulse Rate:  [74-93] 76 (05/03 0657) Cardiac Rhythm:  [-] Normal sinus rhythm;Bundle branch block (05/03 0400) Resp:  [13-20] 16 (05/03 0657) BP: (86-143)/(48-101) 95/53 mmHg (05/03 0600) SpO2:  [93 %-100 %] 96 % (05/03 0657) Arterial Line BP: (58-162)/(41-84) 100/51 mmHg (05/03 0657)  Filed Weights   07/24/14 0600 07/25/14 0607 07/26/14 0609  Weight: 98 lb 14.4 oz (44.861 kg) 101 lb 12.8 oz (46.176 kg) 101 lb 3.2 oz (45.904 kg)    Weight change:    Hemodynamic parameters for last 24 hours:    Intake/Output from previous day: 05/02 0701 - 05/03 0700 In: 1950 [P.O.:300; I.V.:1550; IV Piggyback:100] Out: 1940 [Urine:810; Blood:20; Chest Tube:310]  Intake/Output this shift:    Current Meds: Scheduled Meds: . acetaminophen  500 mg Oral 4 times per day   Or  . acetaminophen (TYLENOL) oral liquid 160 mg/5 mL  500 mg Oral 4 times per day  . aspirin EC  81 mg Oral Daily  . bisacodyl  10 mg Oral Daily  . carvedilol  3.125 mg Oral BID WC  . furosemide  40 mg Oral Daily  . lisinopril  2.5 mg Oral BID  . senna-docusate  1 tablet Oral QHS   Continuous Infusions:  PRN Meds:.fentaNYL (SUBLIMAZE) injection, ondansetron (ZOFRAN) IV, oxyCODONE, traMADol  Physical Exam: General appearance: alert, cooperative and no distress Heart: regular rate and rhythm Lungs: clear to auscultation bilaterally Wound: Clean and dry    Lab Results: CBC: Recent Labs   07/25/14 1857 07/27/14 0611  WBC 6.9 10.2  HGB 12.2 12.4  HCT 38.2 37.9  PLT 234 267   BMET:  Recent Labs  07/26/14 1135 07/27/14 0611  NA 136 135  K 4.6 5.2*  CL 104 102  CO2 26 24  GLUCOSE 129* 90  BUN 16 17  CREATININE 0.85 0.97  CALCIUM 8.4* 8.5*    PT/INR:  Recent Labs  07/25/14 1857  LABPROT 13.6  INR 1.03   Radiology: Dg Chest Port 1 View  07/26/2014   CLINICAL DATA:  Congestive heart failure .  EXAM: PORTABLE CHEST - 1 VIEW  COMPARISON:  07/25/2014.  FINDINGS: Right IJ line in stable position. Surgical tubing noted over the lower chest. Cardiomegaly with pulmonary vascular prominence and diffuse interstitial prominence consistent congestive heart failure. Findings have progressed slightly from prior exam. Small bilateral effusions.  IMPRESSION: 1. Right IJ line noted with tip projected over the cavoatrial junction.  2. Congestive heart failure with pulmonary interstitial edema and small pleural effusions. Changes of congestive heart failure have progressed from prior exam .   Electronically Signed   By: Maisie Fus  Register   On: 07/26/2014 09:40     Assessment/Plan: S/P Procedure(s) (LRB): SUBXYPHOID PERICARDIAL WINDOW (N/A) TRANSESOPHAGEAL ECHOCARDIOGRAM (TEE) (N/A) DRAINAGE OF PERICARDIAL EFFUSION (N/A) CT output around 300 ml  total yesterday.  Will continue CT to suction for now. Cultures/pathology pending.  Continue current care per primary service.  COLLINS,GINA H 07/27/2014 8:31 AM  Decreasing drainage from pericardium Cytology- reactive mesothelial cells, peri pericardial lymph node also  sent  Result pending, labs in pleural fluid in results  Poss remove tube tomorrow I have seen and examined Katrina Peck and agree with the above assessment  and plan.  Delight Ovens MD Beeper [phone] Office [phone] 07/27/2014 3:40 PM

## 2014-07-27 NOTE — Progress Notes (Signed)
Patient ID: Katrina Peck, female   DOB: 01/06/1946, 69 y.o.   MRN: [id number]  Patient with history of HTN was admitted 4/26 with 3 months of exertional dyspnea, weight gain, cough, and orthopnea.  Echo shows EF 20% with diffuse hypokinesis and mildly decreased RV systolic function with large pericardial effusion.  Underwent cath with normal hemdynamics and normal cors.   F/u echo after diuresis showed persistent large pericardial effusion. CT scan chest with small pleural effusion. No malignancy. ESR 5  On 5/2, she went for subxiphoid pericardial window.    This morning, complains of pain at surgical site.  No dyspnea.    Scheduled Meds: . acetaminophen  500 mg Oral 4 times per day   Or  . acetaminophen (TYLENOL) oral liquid 160 mg/5 mL  500 mg Oral 4 times per day  . aspirin EC  81 mg Oral Daily  . bisacodyl  10 mg Oral Daily  . carvedilol  3.125 mg Oral BID WC  . furosemide  40 mg Oral Daily  . lisinopril  2.5 mg Oral BID  . senna-docusate  1 tablet Oral QHS   Continuous Infusions:   PRN Meds:.fentaNYL (SUBLIMAZE) injection, ondansetron (ZOFRAN) IV, oxyCODONE, traMADol    Filed Vitals:   07/27/14 0500 07/27/14 0600 07/27/14 0645 07/27/14 0657  BP: 86/48 95/53    Pulse: 74 79 75 76  Temp:      TempSrc:      Resp: 14 14 18 16   Height:      Weight:      SpO2: 95% 96% 95% 96%    Intake/Output Summary (Last 24 hours) at 07/27/14 0727 Last data filed at 07/27/14 0600  Gross per 24 hour  Intake   1950 ml  Output   1940 ml  Net     10 ml    LABS: Basic Metabolic Panel:  Recent Labs  07/26/14 1135 07/27/14 0611  NA 136 135  K 4.6 5.2*  CL 104 102  CO2 26 24  GLUCOSE 129* 90  BUN 16 17  CREATININE 0.85 0.97  CALCIUM 8.4* 8.5*   Liver Function Tests:  Recent Labs  07/25/14 1857  AST 32  ALT 29  ALKPHOS 95  BILITOT 0.8  PROT 6.5  ALBUMIN 2.8*   No results for input(s): LIPASE, AMYLASE in the last 72 hours. CBC:  Recent Labs  07/25/14 1857  07/27/14 0611  WBC 6.9 10.2  HGB 12.2 12.4  HCT 38.2 37.9  MCV 86.6 85.7  PLT 234 267   Cardiac Enzymes: No results for input(s): CKTOTAL, CKMB, CKMBINDEX, TROPONINI in the last 72 hours. BNP: Invalid input(s): POCBNP D-Dimer: No results for input(s): DDIMER in the last 72 hours. Hemoglobin A1C: No results for input(s): HGBA1C in the last 72 hours. Fasting Lipid Panel: No results for input(s): CHOL, HDL, LDLCALC, TRIG, CHOLHDL, LDLDIRECT in the last 72 hours. Thyroid Function Tests: No results for input(s): TSH, T4TOTAL, T3FREE, THYROIDAB in the last 72 hours.  Invalid input(s): FREET3 Anemia Panel: No results for input(s): VITAMINB12, FOLATE, FERRITIN, TIBC, IRON, RETICCTPCT in the last 72 hours.  RADIOLOGY: Dg Chest 2 View  07/25/2014   CLINICAL DATA:  Preoperative cardiovascular examination chest x-ray history of congestive heart failure hypertension pericardial effusion  EXAM: CHEST  2 VIEW  COMPARISON:  07/24/2014  FINDINGS: Severe enlargement of cardiac silhouette consistent with known large pericardial effusion. The vascular pattern is normal.  There is an element of hyperinflation suggesting COPD. Known small right pleural effusion.  Bony thorax intact.  IMPRESSION: Severe cardiac silhouette enlargement consistent with known pericardial effusion.   Electronically Signed   By: Skipper Cliche M.D.   On: 07/25/2014 17:58   Dg Chest 2 View  07/20/2014   CLINICAL DATA:  Short  of breath and weakness for 2 weeks  EXAM: CHEST  2 VIEW  COMPARISON:  None.  FINDINGS: There is moderate cardiac enlargement. Bilateral pleural effusions and interstitial edema is identified. Decreased aeration to lung bases likely represents atelectasis. Mild degenerative disc disease within the thoracic spine.  IMPRESSION: 1. Cardiac enlargement and CHF.   Electronically Signed   By: Kerby Moors M.D.   On: 07/20/2014 19:01   Ct Chest Wo Contrast  07/24/2014   CLINICAL DATA:  Pericardial effusion.  EXAM:  CT CHEST WITHOUT CONTRAST  TECHNIQUE: Multidetector CT imaging of the chest was performed following the standard protocol without IV contrast.  COMPARISON:  Chest x-ray 07/20/2014  FINDINGS: Heart: There is large pericardial effusion. Effusion measures 2.6 cm and appears low attenuation. There is aortic valve calcification. No significant coronary artery calcifications.  Vascular structures: Minimal atherosclerosis of the thoracic aorta. No aneurysm.  Mediastinum/thyroid: The visualized portion of the thyroid gland has a normal appearance. No mediastinal, hilar, or axillary adenopathy.  Lungs/Airways: Airways are patent. There is a right pleural effusion. There are no focal consolidations. No pulmonary edema. Minimal left lower lobe atelectasis.  Upper abdomen: Unremarkable.  Chest wall/osseous structures: No suspicious lytic or blastic lesions are identified.  IMPRESSION: 1. Large pericardial effusion. 2. Small right pleural effusion.   Electronically Signed   By: Nolon Nations M.D.   On: 07/24/2014 18:24   Dg Chest Port 1 View  07/27/2014   CLINICAL DATA:  Pericardial effusion.  EXAM: PORTABLE CHEST - 1 VIEW  COMPARISON:  07/26/2014  FINDINGS: There is stable cardiomegaly. The right jugular central line is unchanged, extending to the cavoatrial junction. Tubing overlies the epigastric region and lower midline chest. Pleural effusions are present bilaterally, small. There is no pneumothorax.  IMPRESSION: Small pleural effusions, new or enlarged. Unchanged cardiac silhouette. Right jugular central line appears satisfactorily positioned.   Electronically Signed   By: Andreas Newport M.D.   On: 07/27/2014 06:27   Dg Chest Port 1 View  07/26/2014   CLINICAL DATA:  Congestive heart failure .  EXAM: PORTABLE CHEST - 1 VIEW  COMPARISON:  07/25/2014.  FINDINGS: Right IJ line in stable position. Surgical tubing noted over the lower chest. Cardiomegaly with pulmonary vascular prominence and diffuse interstitial  prominence consistent congestive heart failure. Findings have progressed slightly from prior exam. Small bilateral effusions.  IMPRESSION: 1. Right IJ line noted with tip projected over the cavoatrial junction.  2. Congestive heart failure with pulmonary interstitial edema and small pleural effusions. Changes of congestive heart failure have progressed from prior exam .   Electronically Signed   By: [place]   On: 07/26/2014 09:40    PHYSICAL EXAM General: NAD Neck: JVP 7 cm, no thyromegaly or thyroid nodule.  Lungs: Decreased breath sounds at bases bilaterally.  CV: Lateral PMI.  Heart regular S1/S2, no S3, no murmur.  1+ woody edema at ankles   Abdomen: Soft, nontender, no hepatosplenomegaly, no distention.  Neurologic: Alert and oriented x 3.  Psych: Normal affect. Extremities: No clubbing or cyanosis.   TELEMETRY: Reviewed telemetry pt in NSR  ASSESSMENT AND PLAN: 69 yo with history of HTN was admitted 4/26 with 3 months of exertional dyspnea, weight gain, cough,  and orthopnea.  1. Acute/chronic systolic CHF: Patient was admitted with NYHA class IIIb-IV symptoms and significant volume overload with S3 on exam.  EF 20% on echo with diffuse hypokinesis.  She has a history of untreated HTN.  Symptoms have been present since 1/16.  No viral-type syndrome.  No drugs/ETOH.  No family history of cardiomypathy.  TSH normal.  No chest pain.  She had not had her BP checked recently prior to admission, mildly elevated initially in the hospital.  She diuresed well with weight down considerably.  Cath with normal cors.  - Now s/p subxiphoid pericardial window.   - Continue Coreg 3.125 mg bid and lisinopril 2.5 mg bid.  - Continue po Lasix but hold spironolactone today with mildly elevated K.  Hopefully able to restart spironolactone.   2. HTN: BP higher initially post-window, now lower with pain med use.  3. Pericardial effusion: Large. No tamponade by echo or cath. Did not improve with  diuresis. CT chest without evidence malignancy. She had window yesterday.  Does not appear that fluid was sent for cytology.  She has some fluid from her pericardial drain today, will send for cytology.  4. Ambulate when chest tube out.   Loralie Champagne MD 07/27/2014 7:27 AM

## 2014-07-28 ENCOUNTER — Inpatient Hospital Stay (HOSPITAL_COMMUNITY): Payer: Medicare Other

## 2014-07-28 DIAGNOSIS — I429 Cardiomyopathy, unspecified: Secondary | ICD-10-CM

## 2014-07-28 DIAGNOSIS — I480 Paroxysmal atrial fibrillation: Secondary | ICD-10-CM

## 2014-07-28 LAB — COMPREHENSIVE METABOLIC PANEL
ALT: 19 U/L (ref 14–54)
AST: 25 U/L (ref 15–41)
Albumin: 2 g/dL — ABNORMAL LOW (ref 3.5–5.0)
Alkaline Phosphatase: 79 U/L (ref 38–126)
Anion gap: 7 (ref 5–15)
BUN: 24 mg/dL — ABNORMAL HIGH (ref 6–20)
CALCIUM: 7.9 mg/dL — AB (ref 8.9–10.3)
CO2: 27 mmol/L (ref 22–32)
Chloride: 100 mmol/L — ABNORMAL LOW (ref 101–111)
Creatinine, Ser: 1.05 mg/dL — ABNORMAL HIGH (ref 0.44–1.00)
GFR calc non Af Amer: 53 mL/min — ABNORMAL LOW (ref >60)
Glucose, Bld: 114 mg/dL — ABNORMAL HIGH (ref 70–99)
Potassium: 4.3 mmol/L (ref 3.5–5.1)
SODIUM: 134 mmol/L — AB (ref 135–145)
TOTAL PROTEIN: 5.5 g/dL — AB (ref 6.5–8.1)
Total Bilirubin: 0.4 mg/dL (ref 0.3–1.2)

## 2014-07-28 LAB — CARBOXYHEMOGLOBIN
Carboxyhemoglobin: 0.9 % (ref 0.5–1.5)
METHEMOGLOBIN: 0.9 % (ref 0.0–1.5)
O2 Saturation: 72.4 %
Total hemoglobin: 11.8 g/dL — ABNORMAL LOW (ref 12.0–16.0)

## 2014-07-28 LAB — CBC
HEMATOCRIT: 35.2 % — AB (ref 36.0–46.0)
Hemoglobin: 11.4 g/dL — ABNORMAL LOW (ref 12.0–15.0)
MCH: 27.6 pg (ref 26.0–34.0)
MCHC: 32.4 g/dL (ref 30.0–36.0)
MCV: 85.2 fL (ref 78.0–100.0)
Platelets: 236 10*3/uL (ref 150–400)
RBC: 4.13 MIL/uL (ref 3.87–5.11)
RDW: 14.8 % (ref 11.5–15.5)
WBC: 7.6 10*3/uL (ref 4.0–10.5)

## 2014-07-28 MED ORDER — AMIODARONE HCL 200 MG PO TABS
200.0000 mg | ORAL_TABLET | Freq: Every day | ORAL | Status: DC
  Administered 2014-07-28 – 2014-07-30 (×3): 200 mg via ORAL
  Filled 2014-07-28 (×3): qty 1

## 2014-07-28 NOTE — Evaluation (Signed)
Physical Therapy Evaluation Patient Details Name: Katrina Peck MRN: [id number] DOB: [birth year] Today's Date: 07/28/2014   History of Present Illness  Patient with history of HTN was admitted 4/26 with 3 months of exertional dyspnea, weight gain, cough, and orthopnea. Echo shows EF 20% with diffuse hypokinesis and mildly decreased RV systolic function with large pericardial effusion.  Pericardial windown 07/26/14.    Clinical Impression  Pt admitted with above diagnosis. Pt currently with functional limitations due to the deficits listed below (see PT Problem List). Pt should do well and go home with husband with 24 hour care.  Will need RW and 3N1.  Pt will benefit from skilled PT to increase their independence and safety with mobility to allow discharge to the venue listed below.      Follow Up Recommendations Home health PT;Supervision/Assistance - 24 hour    Equipment Recommendations  Rolling walker with 5" wheels;3in1 (PT)    Recommendations for Other Services       Precautions / Restrictions Precautions Precautions: Fall Restrictions Weight Bearing Restrictions: No      Mobility  Bed Mobility Overal bed mobility: Needs Assistance Bed Mobility: Supine to Sit     Supine to sit: Min guard     General bed mobility comments: incr time but can get to EOb without assist.  Needed min guard asssit as pt slid out to very EOB and appeared unsafe.    Transfers Overall transfer level: Needs assistance Equipment used: 2 person hand held assist Transfers: Sit to/from [employer] Sit to Stand: Mod assist;+2 physical assistance Stand pivot transfers: Mod assist;+2 physical assistance       General transfer comment: Pt needed mod assist of 2 as she was unsteady with BOS widened with pt having difficulty moving her LEs for pivot transfer.  Pt with poor safety awareness as she was pivoting needing +2 mod assist for safety.  Pt with poor coordination of feet as well as  slightly flexed posture.    Ambulation/Gait                Stairs            Wheelchair Mobility    Modified Rankin (Stroke Patients Only)       Balance Overall balance assessment: Needs assistance;History of Falls         Standing balance support: No upper extremity supported;During functional activity Standing balance-Leahy Scale: Poor Standing balance comment: Requires UE support bil for stability.                               Pertinent Vitals/Pain Pain Assessment: No/denies pain  VSS    Home Living Family/patient expects to be discharged to:: Private residence Living Arrangements: Spouse/significant other Available Help at Discharge: Family;Available 24 hours/day Type of Home: House Home Access: Stairs to enter Entrance Stairs-Rails: Right;Left;Can reach both Entrance Stairs-Number of Steps: 2 Home Layout: One level Home Equipment: None      Prior Function Level of Independence: Independent               Hand Dominance   Dominant Hand: Right    Extremity/Trunk Assessment   Upper Extremity Assessment: Defer to OT evaluation           Lower Extremity Assessment: Generalized weakness         Communication   Communication: No difficulties  Cognition Arousal/Alertness: Awake/alert Behavior During Therapy: Anxious Overall Cognitive Status: Within Functional  Limits for tasks assessed       Memory: Decreased short-term memory              General Comments      Exercises        Assessment/Plan    PT Assessment Patient needs continued PT services  PT Diagnosis Generalized weakness   PT Problem List Decreased activity tolerance;Decreased balance;Decreased mobility;Decreased knowledge of use of DME;Decreased safety awareness;Decreased knowledge of precautions;Decreased coordination  PT Treatment Interventions DME instruction;Gait training;Functional mobility training;Therapeutic exercise;Balance  training;Therapeutic activities;Patient/family education   PT Goals (Current goals can be found in the Care Plan section) Acute Rehab PT Goals Patient Stated Goal: to go home PT Goal Formulation: With patient Time For Goal Achievement: 08/04/14 Potential to Achieve Goals: Good    Frequency Min 3X/week   Barriers to discharge        Co-evaluation               End of Session Equipment Utilized During Treatment: Gait belt Activity Tolerance: Patient limited by fatigue Patient left: in chair;with call bell/phone within reach;with family/visitor present Nurse Communication: Mobility status         Time: 1540-0867 PT Time Calculation (min) (ACUTE ONLY): 24 min   Charges:   PT Evaluation $Initial PT Evaluation Tier I: 1 Procedure PT Treatments $Therapeutic Activity: 8-22 mins   PT G CodesBerline Lopes Aug 13, 2014, 1:48 PM Abbygael Curtiss,PT Acute Rehabilitation [phone] (pager)

## 2014-07-28 NOTE — Care Management Note (Signed)
Case Management Note  Patient Details  Name: Katrina Peck MRN: [id number] Date of Birth: March 18, 1946  Subjective/Objective:                    Action/Plan:   Expected Discharge Date:                  Expected Discharge Plan:     In-House Referral:     Discharge planning Services     Post Acute Care Choice:    Choice offered to:     DME Arranged:    DME Agency:     HH Arranged:    HH Agency:     Status of Service:     Medicare Important Message Given:    Date Medicare IM Given:    Medicare IM give by:    Date Additional Medicare IM Given:    Additional Medicare Important Message give by:     If discussed at Long Length of Stay Meetings, dates discussed:  07/29/14  Additional Comments:  Hanley Hays, RN 07/28/2014, 9:12 AM

## 2014-07-28 NOTE — Progress Notes (Signed)
Patient ID: Katrina Peck, female   DOB: March 11, 1946, 69 y.o.   MRN: [id number]    Patient with history of HTN was admitted 4/26 with 3 months of exertional dyspnea, weight gain, cough, and orthopnea.  Echo shows EF 20% with diffuse hypokinesis and mildly decreased RV systolic function with large pericardial effusion.  Underwent cath with normal hemdynamics and normal cors.   F/u echo after diuresis showed persistent large pericardial effusion. CT scan chest with small pleural effusion. No malignancy. ESR 5  On 5/2, she went for subxiphoid pericardial window.  Cytology with no malignant cells.     Last night, went into atrial fibrillation with RVR.  Amiodarone gtt started, she converted back to NSR.  Became hypotensive on amiodarone gtt so it was stopped.  BP last 95/60.  Still soreness at chest tube site.  No dyspnea.  CVP 5.   Scheduled Meds: . acetaminophen  500 mg Oral 4 times per day   Or  . acetaminophen (TYLENOL) oral liquid 160 mg/5 mL  500 mg Oral 4 times per day  . amiodarone  200 mg Oral Daily  . aspirin EC  81 mg Oral Daily  . bisacodyl  10 mg Oral Daily  . carvedilol  3.125 mg Oral BID WC  . lisinopril  2.5 mg Oral BID  . senna-docusate  1 tablet Oral QHS   Continuous Infusions:   PRN Meds:.fentaNYL (SUBLIMAZE) injection, ondansetron (ZOFRAN) IV, oxyCODONE, traMADol    Filed Vitals:   07/28/14 0500 07/28/14 0600 07/28/14 0601 07/28/14 0717  BP: 74/50 71/42 76/43    Pulse: 64 63 64   Temp:    97.5 F (36.4 C)  TempSrc:    Oral  Resp: 12 21 14    Height:      Weight:      SpO2: 98% 100% 100%     Intake/Output Summary (Last 24 hours) at 07/28/14 0754 Last data filed at 07/28/14 0600  Gross per 24 hour  Intake 1519.9 ml  Output   1415 ml  Net  104.9 ml    LABS: Basic Metabolic Panel:  Recent Labs  07/27/14 2310 07/28/14 0500  NA 134* 134*  K 4.3 4.3  CL 98* 100*  CO2 27 27  GLUCOSE 214* 114*  BUN 25* 24*  CREATININE 1.09* 1.05*  CALCIUM 8.2* 7.9*   MG 1.8  --   PHOS 3.2  --    Liver Function Tests:  Recent Labs  07/25/14 1857 07/28/14 0500  AST 32 25  ALT 29 19  ALKPHOS 95 79  BILITOT 0.8 0.4  PROT 6.5 5.5*  ALBUMIN 2.8* 2.0*   No results for input(s): LIPASE, AMYLASE in the last 72 hours. CBC:  Recent Labs  07/27/14 0611 07/28/14 0500  WBC 10.2 7.6  HGB 12.4 11.4*  HCT 37.9 35.2*  MCV 85.7 85.2  PLT 267 236   Cardiac Enzymes: No results for input(s): CKTOTAL, CKMB, CKMBINDEX, TROPONINI in the last 72 hours. BNP: Invalid input(s): POCBNP D-Dimer: No results for input(s): DDIMER in the last 72 hours. Hemoglobin A1C: No results for input(s): HGBA1C in the last 72 hours. Fasting Lipid Panel: No results for input(s): CHOL, HDL, LDLCALC, TRIG, CHOLHDL, LDLDIRECT in the last 72 hours. Thyroid Function Tests: No results for input(s): TSH, T4TOTAL, T3FREE, THYROIDAB in the last 72 hours.  Invalid input(s): FREET3 Anemia Panel: No results for input(s): VITAMINB12, FOLATE, FERRITIN, TIBC, IRON, RETICCTPCT in the last 72 hours.  RADIOLOGY: Dg Chest 2 View  07/25/2014  CLINICAL DATA:  Preoperative cardiovascular examination chest x-ray history of congestive heart failure hypertension pericardial effusion  EXAM: CHEST  2 VIEW  COMPARISON:  07/24/2014  FINDINGS: Severe enlargement of cardiac silhouette consistent with known large pericardial effusion. The vascular pattern is normal.  There is an element of hyperinflation suggesting COPD. Known small right pleural effusion. Bony thorax intact.  IMPRESSION: Severe cardiac silhouette enlargement consistent with known pericardial effusion.   Electronically Signed   By: Skipper Cliche M.D.   On: 07/25/2014 17:58   Dg Chest 2 View  07/20/2014   CLINICAL DATA:  Short  of breath and weakness for 2 weeks  EXAM: CHEST  2 VIEW  COMPARISON:  None.  FINDINGS: There is moderate cardiac enlargement. Bilateral pleural effusions and interstitial edema is identified. Decreased aeration to  lung bases likely represents atelectasis. Mild degenerative disc disease within the thoracic spine.  IMPRESSION: 1. Cardiac enlargement and CHF.   Electronically Signed   By: Kerby Moors M.D.   On: 07/20/2014 19:01   Ct Chest Wo Contrast  07/24/2014   CLINICAL DATA:  Pericardial effusion.  EXAM: CT CHEST WITHOUT CONTRAST  TECHNIQUE: Multidetector CT imaging of the chest was performed following the standard protocol without IV contrast.  COMPARISON:  Chest x-ray 07/20/2014  FINDINGS: Heart: There is large pericardial effusion. Effusion measures 2.6 cm and appears low attenuation. There is aortic valve calcification. No significant coronary artery calcifications.  Vascular structures: Minimal atherosclerosis of the thoracic aorta. No aneurysm.  Mediastinum/thyroid: The visualized portion of the thyroid gland has a normal appearance. No mediastinal, hilar, or axillary adenopathy.  Lungs/Airways: Airways are patent. There is a right pleural effusion. There are no focal consolidations. No pulmonary edema. Minimal left lower lobe atelectasis.  Upper abdomen: Unremarkable.  Chest wall/osseous structures: No suspicious lytic or blastic lesions are identified.  IMPRESSION: 1. Large pericardial effusion. 2. Small right pleural effusion.   Electronically Signed   By: Nolon Nations M.D.   On: 07/24/2014 18:24   Dg Chest Port 1 View  07/28/2014   CLINICAL DATA:  Pericardial effusion.  Dyspnea.  EXAM: PORTABLE CHEST - 1 VIEW  COMPARISON:  07/27/2014  FINDINGS: There is no change in the enlarged cardiac silhouette. There is a right jugular central line extending to the cavoatrial junction. There is no pneumothorax. There is persistent consolidation and effusion in the left base. There is persistent mild ground-glass opacity in the right base. There is a very small right effusion.  IMPRESSION: Unchanged cardiomegaly. Left base consolidation and small effusions persist without significant interval change.   Electronically  Signed   By: Andreas Newport M.D.   On: 07/28/2014 05:43   Dg Chest Port 1 View  07/27/2014   CLINICAL DATA:  Pericardial effusion.  EXAM: PORTABLE CHEST - 1 VIEW  COMPARISON:  07/26/2014  FINDINGS: There is stable cardiomegaly. The right jugular central line is unchanged, extending to the cavoatrial junction. Tubing overlies the epigastric region and lower midline chest. Pleural effusions are present bilaterally, small. There is no pneumothorax.  IMPRESSION: Small pleural effusions, new or enlarged. Unchanged cardiac silhouette. Right jugular central line appears satisfactorily positioned.   Electronically Signed   By: Andreas Newport M.D.   On: 07/27/2014 06:27   Dg Chest Port 1 View  07/26/2014   CLINICAL DATA:  Congestive heart failure .  EXAM: PORTABLE CHEST - 1 VIEW  COMPARISON:  07/25/2014.  FINDINGS: Right IJ line in stable position. Surgical tubing noted over the lower chest. Cardiomegaly  with pulmonary vascular prominence and diffuse interstitial prominence consistent congestive heart failure. Findings have progressed slightly from prior exam. Small bilateral effusions.  IMPRESSION: 1. Right IJ line noted with tip projected over the cavoatrial junction.  2. Congestive heart failure with pulmonary interstitial edema and small pleural effusions. Changes of congestive heart failure have progressed from prior exam .   Electronically Signed   By: [place]   On: 07/26/2014 09:40    PHYSICAL EXAM General: NAD Neck: JVP 7 cm, no thyromegaly or thyroid nodule.  Lungs: Decreased breath sounds at bases bilaterally.  CV: Lateral PMI.  Heart regular S1/S2, no S3, no murmur.  1+ woody edema at ankles   Abdomen: Soft, nontender, no hepatosplenomegaly, no distention.  Neurologic: Alert and oriented x 3.  Psych: Normal affect. Extremities: No clubbing or cyanosis.   TELEMETRY: Reviewed telemetry pt in NSR  ASSESSMENT AND PLAN: 69 yo with history of HTN was admitted 4/26 with 3 months of  exertional dyspnea, weight gain, cough, and orthopnea.  1. Acute/chronic systolic CHF: Patient was admitted with NYHA class IIIb-IV symptoms and significant volume overload with S3 on exam.  EF 20% on echo with diffuse hypokinesis.  She has a history of untreated HTN.  Symptoms have been present since 1/16.  No viral-type syndrome.  No drugs/ETOH.  No family history of cardiomypathy.  TSH normal.  No chest pain.  She had not had her BP checked recently prior to admission, mildly elevated initially in the hospital.  She diuresed well with weight down considerably.  Cath with normal cors.  - Now s/p subxiphoid pericardial window.   - Continue Coreg 3.125 mg bid and lisinopril 2.5 mg bid as long as SBP > 85.   - Hold po Lasix today with CVP 5, likely restart tomorrow.  Will also try to get her back on spironolactone tomorrow.  2. HTN: BP now low.  3. Pericardial effusion: Large. No tamponade by echo or cath. Did not improve with diuresis. CT chest without evidence malignancy. S/p pericardial window.  Cytology from pericardial fluid did not show malignancy.  4. Atrial fibrillation: Paroxysmal.  Atrial fibrillation with RVR last night, amiodarone started and she converted to NSR.  Hypotensive on amiodarone gtt so this was stopped.  Will get po amiodarone 200 mg daily starting today.  When chest tube is out, will plan on starting her on Eliquis (CHADSVASC = 4 for age/CHF/gender/HTN).   5. Ambulate when chest tube out.   Loralie Champagne MD 07/28/2014 7:54 AM

## 2014-07-28 NOTE — Progress Notes (Addendum)
[facility] DAILY ICU PROGRESS NOTE                   [address]            [place] [address]          [phone]   2 Days Post-Op Procedure(s) (LRB): SUBXYPHOID PERICARDIAL WINDOW (N/A) TRANSESOPHAGEAL ECHOCARDIOGRAM (TEE) (N/A) DRAINAGE OF PERICARDIAL EFFUSION (N/A)  Total Length of Stay:  LOS: 8 days   Subjective: Some discomfort from the tube, 105 ml out yesterday  Objective: Vital signs in last 24 hours: Temp:  [97.5 F (36.4 C)-98.5 F (36.9 C)] 97.5 F (36.4 C) (05/04 0717) Pulse Rate:  [40-118] 63 (05/04 0750) Cardiac Rhythm:  [-] Normal sinus rhythm;Bundle branch block (05/04 0400) Resp:  [12-23] 14 (05/04 0750) BP: (62-139)/(35-84) 95/60 mmHg (05/04 0750) SpO2:  [91 %-100 %] 100 % (05/04 0750) Arterial Line BP: (58-99)/(41-54) 58/48 mmHg (05/03 1100)  Filed Weights   07/24/14 0600 07/25/14 0607 07/26/14 0609  Weight: 98 lb 14.4 oz (44.861 kg) 101 lb 12.8 oz (46.176 kg) 101 lb 3.2 oz (45.904 kg)    Weight change:    Hemodynamic parameters for last 24 hours: CVP:  [3 mmHg-11 mmHg] 5 mmHg  Intake/Output from previous day: 05/03 0701 - 05/04 0700 In: 1536.6 [P.O.:1320; I.V.:216.6] Out: 1415 [Urine:1360; Chest Tube:55]  Intake/Output this shift:    Current Meds: Scheduled Meds: . acetaminophen  500 mg Oral 4 times per day   Or  . acetaminophen (TYLENOL) oral liquid 160 mg/5 mL  500 mg Oral 4 times per day  . amiodarone  200 mg Oral Daily  . aspirin EC  81 mg Oral Daily  . bisacodyl  10 mg Oral Daily  . carvedilol  3.125 mg Oral BID WC  . lisinopril  2.5 mg Oral BID  . senna-docusate  1 tablet Oral QHS   Continuous Infusions:  PRN Meds:.fentaNYL (SUBLIMAZE) injection, ondansetron (ZOFRAN) IV, oxyCODONE, traMADol  General appearance: alert, cooperative and no distress Heart: regular rate and rhythm Lungs: dim in bases  Lab Results: CBC: Recent Labs  07/27/14 0611 07/28/14 0500  WBC 10.2 7.6  HGB 12.4 11.4*  HCT 37.9 35.2*    PLT 267 236   BMET:  Recent Labs  07/27/14 2310 07/28/14 0500  NA 134* 134*  K 4.3 4.3  CL 98* 100*  CO2 27 27  GLUCOSE 214* 114*  BUN 25* 24*  CREATININE 1.09* 1.05*  CALCIUM 8.2* 7.9*    PT/INR:  Recent Labs  07/25/14 1857  LABPROT 13.6  INR 1.03   Results for orders placed or performed during the hospital encounter of 07/20/14  Surgical pcr screen     Status: Abnormal   Collection Time: 07/26/14  6:17 AM  Result Value Ref Range Status   MRSA, PCR NEGATIVE NEGATIVE Final   Staphylococcus aureus POSITIVE (A) NEGATIVE Final    Comment:        The Xpert SA Assay (FDA approved for NASAL specimens in patients over 82 years of age), is one component of a comprehensive surveillance program.  Test performance has been validated by [facility] for patients greater than or equal to 73 year old. It is not intended to diagnose infection nor to guide or monitor treatment.   Anaerobic culture     Status: None (Preliminary result)   Collection Time: 07/26/14  8:28 AM  Result Value Ref Range Status   Specimen Description FLUID PERICARDIAL  Final   Special  Requests SPEC D PT ON ZINACEF  Final   Gram Stain   Final    RARE WBC PRESENT, PREDOMINANTLY MONONUCLEAR NO SQUAMOUS EPITHELIAL CELLS SEEN NO ORGANISMS SEEN Performed at [facility]    Culture   Final    NO ANAEROBES ISOLATED; CULTURE IN PROGRESS FOR 5 DAYS Performed at [facility]    Report Status PENDING  Incomplete  Fungus Culture with Smear     Status: None (Preliminary result)   Collection Time: 07/26/14  8:28 AM  Result Value Ref Range Status   Specimen Description FLUID PERICARDIAL  Final   Special Requests SPEC D PT ON ZINACEF  Final   Fungal Smear   Final    NO YEAST OR FUNGAL ELEMENTS SEEN Performed at [facility]    Culture   Final    CULTURE IN PROGRESS FOR FOUR WEEKS Performed at [facility]    Report Status PENDING  Incomplete  Body fluid culture      Status: None (Preliminary result)   Collection Time: 07/26/14  8:28 AM  Result Value Ref Range Status   Specimen Description FLUID PERICARDIAL  Final   Special Requests SPEC D PT ON ZINACEF  Final   Gram Stain   Final    RARE WBC PRESENT, PREDOMINANTLY MONONUCLEAR NO ORGANISMS SEEN Performed at [facility]    Culture   Final    NO GROWTH 1 DAY Performed at [facility]    Report Status PENDING  Incomplete  AFB culture with smear     Status: None (Preliminary result)   Collection Time: 07/26/14  8:28 AM  Result Value Ref Range Status   Specimen Description FLUID PERICARDIAL  Final   Special Requests SPEC D PT ON ZINACEF  Final   Acid Fast Smear   Final    NO ACID FAST BACILLI SEEN Performed at [facility]    Culture   Final    CULTURE WILL BE EXAMINED FOR 6 WEEKS BEFORE ISSUING A FINAL REPORT Performed at [facility]    Report Status PENDING  Incomplete     Radiology: Dg Chest Port 1 View  07/27/2014   CLINICAL DATA:  Pericardial effusion.  EXAM: PORTABLE CHEST - 1 VIEW  COMPARISON:  07/26/2014  FINDINGS: There is stable cardiomegaly. The right jugular central line is unchanged, extending to the cavoatrial junction. Tubing overlies the epigastric region and lower midline chest. Pleural effusions are present bilaterally, small. There is no pneumothorax.  IMPRESSION: Small pleural effusions, new or enlarged. Unchanged cardiac silhouette. Right jugular central line appears satisfactorily positioned.   Electronically Signed   By: Ellery Plunk M.D.   On: 07/27/2014 06:27     Assessment/Plan: S/P Procedure(s) (LRB): SUBXYPHOID PERICARDIAL WINDOW (N/A) TRANSESOPHAGEAL ECHOCARDIOGRAM (TEE) (N/A) DRAINAGE OF PERICARDIAL EFFUSION (N/A)  1 stable, d/c tube today 2 Path pending 3 management per primary   GOLD,WAYNE E 07/28/2014 8:33 AM  I have seen and examined Katrina Peck and agree with the above assessment  and plan.  Delight Ovens  MD Beeper [phone] Office [phone] 07/28/2014 3:01 PM

## 2014-07-29 LAB — BODY FLUID CULTURE: Culture: NO GROWTH

## 2014-07-29 LAB — CBC
HCT: 36 % (ref 36.0–46.0)
Hemoglobin: 11.4 g/dL — ABNORMAL LOW (ref 12.0–15.0)
MCH: 27.6 pg (ref 26.0–34.0)
MCHC: 31.7 g/dL (ref 30.0–36.0)
MCV: 87.2 fL (ref 78.0–100.0)
PLATELETS: 203 10*3/uL (ref 150–400)
RBC: 4.13 MIL/uL (ref 3.87–5.11)
RDW: 14.8 % (ref 11.5–15.5)
WBC: 7 10*3/uL (ref 4.0–10.5)

## 2014-07-29 LAB — CARBOXYHEMOGLOBIN
Carboxyhemoglobin: 1.1 % (ref 0.5–1.5)
METHEMOGLOBIN: 0.8 % (ref 0.0–1.5)
O2 SAT: 71.6 %
TOTAL HEMOGLOBIN: 9.8 g/dL — AB (ref 12.0–16.0)

## 2014-07-29 LAB — BASIC METABOLIC PANEL
ANION GAP: 5 (ref 5–15)
BUN: 19 mg/dL (ref 6–20)
CALCIUM: 8.1 mg/dL — AB (ref 8.9–10.3)
CO2: 25 mmol/L (ref 22–32)
CREATININE: 0.79 mg/dL (ref 0.44–1.00)
Chloride: 107 mmol/L (ref 101–111)
GFR calc non Af Amer: >60 mL/min (ref >60)
GLUCOSE: 102 mg/dL — AB (ref 70–99)
Potassium: 4.1 mmol/L (ref 3.5–5.1)
SODIUM: 137 mmol/L (ref 135–145)

## 2014-07-29 MED ORDER — SODIUM CHLORIDE 0.9 % IJ SOLN
10.0000 mL | INTRAMUSCULAR | Status: DC | PRN
  Administered 2014-07-29 – 2014-07-30 (×2): 20 mL
  Filled 2014-07-29: qty 40

## 2014-07-29 MED ORDER — SODIUM CHLORIDE 0.9 % IJ SOLN
10.0000 mL | Freq: Two times a day (BID) | INTRAMUSCULAR | Status: DC
  Administered 2014-07-30: 10 mL

## 2014-07-29 MED ORDER — SPIRONOLACTONE 12.5 MG HALF TABLET
12.5000 mg | ORAL_TABLET | Freq: Every day | ORAL | Status: DC
  Administered 2014-07-29 – 2014-07-30 (×2): 12.5 mg via ORAL
  Filled 2014-07-29 (×2): qty 1

## 2014-07-29 MED ORDER — APIXABAN 5 MG PO TABS
5.0000 mg | ORAL_TABLET | Freq: Two times a day (BID) | ORAL | Status: DC
Start: 2014-07-29 — End: 2014-07-30
  Administered 2014-07-29 – 2014-07-30 (×3): 5 mg via ORAL
  Filled 2014-07-29 (×4): qty 1

## 2014-07-29 NOTE — Progress Notes (Signed)
Patient ID: Katrina Peck, female   DOB: [birth year], 69 y.o.   MRN: [id number]    Patient with history of HTN was admitted 4/26 with 3 months of exertional dyspnea, weight gain, cough, and orthopnea.  Echo shows EF 20% with diffuse hypokinesis and mildly decreased RV systolic function with large pericardial effusion.  Underwent cath with normal hemdynamics and normal cors.   F/u echo after diuresis showed persistent large pericardial effusion. CT scan chest with small pleural effusion. No malignancy. ESR 5  On 5/2, she went for subxiphoid pericardial window.  Cytology with no malignant cells.     On 5/3, went into atrial fibrillation with RVR.  Amiodarone gtt started, she converted back to NSR.  Became hypotensive on amiodarone gtt so it was stopped.  Chest tube removed 5/4.  Still soreness at chest tube site.  No dyspnea.  CVP 3, co-ox 72%.   Scheduled Meds: . acetaminophen  500 mg Oral 4 times per day   Or  . acetaminophen (TYLENOL) oral liquid 160 mg/5 mL  500 mg Oral 4 times per day  . amiodarone  200 mg Oral Daily  . bisacodyl  10 mg Oral Daily  . carvedilol  3.125 mg Oral BID WC  . lisinopril  2.5 mg Oral BID  . senna-docusate  1 tablet Oral QHS  . spironolactone  12.5 mg Oral Daily   Continuous Infusions:   PRN Meds:.fentaNYL (SUBLIMAZE) injection, ondansetron (ZOFRAN) IV, oxyCODONE, traMADol    Filed Vitals:   07/29/14 0400 07/29/14 0500 07/29/14 0600 07/29/14 0700  BP: 90/54 93/61 80/46    Pulse:    69  Temp: 98 F (36.7 C)   98.1 F (36.7 C)  TempSrc: Oral   Oral  Resp: 14 16 14    Height:      Weight:      SpO2: 99%       Intake/Output Summary (Last 24 hours) at 07/29/14 0742 Last data filed at 07/29/14 0200  Gross per 24 hour  Intake    960 ml  Output   1000 ml  Net    -40 ml    LABS: Basic Metabolic Panel:  Recent Labs  07/27/14 2310 07/28/14 0500 07/29/14 0244  NA 134* 134* 137  K 4.3 4.3 4.1  CL 98* 100* 107  CO2 27 27 25   GLUCOSE 214* 114*  102*  BUN 25* 24* 19  CREATININE 1.09* 1.05* 0.79  CALCIUM 8.2* 7.9* 8.1*  MG 1.8  --   --   PHOS 3.2  --   --    Liver Function Tests:  Recent Labs  07/28/14 0500  AST 25  ALT 19  ALKPHOS 79  BILITOT 0.4  PROT 5.5*  ALBUMIN 2.0*   No results for input(s): LIPASE, AMYLASE in the last 72 hours. CBC:  Recent Labs  07/28/14 0500 07/29/14 0244  WBC 7.6 7.0  HGB 11.4* 11.4*  HCT 35.2* 36.0  MCV 85.2 87.2  PLT 236 203   Cardiac Enzymes: No results for input(s): CKTOTAL, CKMB, CKMBINDEX, TROPONINI in the last 72 hours. BNP: Invalid input(s): POCBNP D-Dimer: No results for input(s): DDIMER in the last 72 hours. Hemoglobin A1C: No results for input(s): HGBA1C in the last 72 hours. Fasting Lipid Panel: No results for input(s): CHOL, HDL, LDLCALC, TRIG, CHOLHDL, LDLDIRECT in the last 72 hours. Thyroid Function Tests: No results for input(s): TSH, T4TOTAL, T3FREE, THYROIDAB in the last 72 hours.  Invalid input(s): FREET3 Anemia Panel: No results for input(s): VITAMINB12, FOLATE, FERRITIN,  TIBC, IRON, RETICCTPCT in the last 72 hours.  RADIOLOGY: Dg Chest 2 View  07/25/2014   CLINICAL DATA:  Preoperative cardiovascular examination chest x-ray history of congestive heart failure hypertension pericardial effusion  EXAM: CHEST  2 VIEW  COMPARISON:  07/24/2014  FINDINGS: Severe enlargement of cardiac silhouette consistent with known large pericardial effusion. The vascular pattern is normal.  There is an element of hyperinflation suggesting COPD. Known small right pleural effusion. Bony thorax intact.  IMPRESSION: Severe cardiac silhouette enlargement consistent with known pericardial effusion.   Electronically Signed   By: Skipper Cliche M.D.   On: 07/25/2014 17:58   Dg Chest 2 View  07/20/2014   CLINICAL DATA:  Short  of breath and weakness for 2 weeks  EXAM: CHEST  2 VIEW  COMPARISON:  None.  FINDINGS: There is moderate cardiac enlargement. Bilateral pleural effusions and  interstitial edema is identified. Decreased aeration to lung bases likely represents atelectasis. Mild degenerative disc disease within the thoracic spine.  IMPRESSION: 1. Cardiac enlargement and CHF.   Electronically Signed   By: Kerby Moors M.D.   On: 07/20/2014 19:01   Ct Chest Wo Contrast  07/24/2014   CLINICAL DATA:  Pericardial effusion.  EXAM: CT CHEST WITHOUT CONTRAST  TECHNIQUE: Multidetector CT imaging of the chest was performed following the standard protocol without IV contrast.  COMPARISON:  Chest x-ray 07/20/2014  FINDINGS: Heart: There is large pericardial effusion. Effusion measures 2.6 cm and appears low attenuation. There is aortic valve calcification. No significant coronary artery calcifications.  Vascular structures: Minimal atherosclerosis of the thoracic aorta. No aneurysm.  Mediastinum/thyroid: The visualized portion of the thyroid gland has a normal appearance. No mediastinal, hilar, or axillary adenopathy.  Lungs/Airways: Airways are patent. There is a right pleural effusion. There are no focal consolidations. No pulmonary edema. Minimal left lower lobe atelectasis.  Upper abdomen: Unremarkable.  Chest wall/osseous structures: No suspicious lytic or blastic lesions are identified.  IMPRESSION: 1. Large pericardial effusion. 2. Small right pleural effusion.   Electronically Signed   By: Nolon Nations M.D.   On: 07/24/2014 18:24   Dg Chest Port 1 View  07/28/2014   CLINICAL DATA:  Pericardial effusion.  Dyspnea.  EXAM: PORTABLE CHEST - 1 VIEW  COMPARISON:  07/27/2014  FINDINGS: There is no change in the enlarged cardiac silhouette. There is a right jugular central line extending to the cavoatrial junction. There is no pneumothorax. There is persistent consolidation and effusion in the left base. There is persistent mild ground-glass opacity in the right base. There is a very small right effusion.  IMPRESSION: Unchanged cardiomegaly. Left base consolidation and small effusions  persist without significant interval change.   Electronically Signed   By: Andreas Newport M.D.   On: 07/28/2014 05:43   Dg Chest Port 1 View  07/27/2014   CLINICAL DATA:  Pericardial effusion.  EXAM: PORTABLE CHEST - 1 VIEW  COMPARISON:  07/26/2014  FINDINGS: There is stable cardiomegaly. The right jugular central line is unchanged, extending to the cavoatrial junction. Tubing overlies the epigastric region and lower midline chest. Pleural effusions are present bilaterally, small. There is no pneumothorax.  IMPRESSION: Small pleural effusions, new or enlarged. Unchanged cardiac silhouette. Right jugular central line appears satisfactorily positioned.   Electronically Signed   By: Andreas Newport M.D.   On: 07/27/2014 06:27   Dg Chest Port 1 View  07/26/2014   CLINICAL DATA:  Congestive heart failure .  EXAM: PORTABLE CHEST - 1 VIEW  COMPARISON:  07/25/2014.  FINDINGS: Right IJ line in stable position. Surgical tubing noted over the lower chest. Cardiomegaly with pulmonary vascular prominence and diffuse interstitial prominence consistent congestive heart failure. Findings have progressed slightly from prior exam. Small bilateral effusions.  IMPRESSION: 1. Right IJ line noted with tip projected over the cavoatrial junction.  2. Congestive heart failure with pulmonary interstitial edema and small pleural effusions. Changes of congestive heart failure have progressed from prior exam .   Electronically Signed   By: [place]   On: 07/26/2014 09:40    PHYSICAL EXAM General: NAD Neck: JVP 7 cm, no thyromegaly or thyroid nodule.  Lungs: Decreased breath sounds at bases bilaterally.  CV: Lateral PMI.  Heart regular S1/S2, no S3, no murmur.  1+ woody edema at ankles   Abdomen: Soft, nontender, no hepatosplenomegaly, no distention.  Neurologic: Alert and oriented x 3.  Psych: Normal affect. Extremities: No clubbing or cyanosis.   TELEMETRY: Reviewed telemetry pt in NSR  ASSESSMENT AND PLAN: 69  yo with history of HTN was admitted 4/26 with 3 months of exertional dyspnea, weight gain, cough, and orthopnea.  1. Acute/chronic systolic CHF: Patient was admitted with NYHA class IIIb-IV symptoms and significant volume overload with S3 on exam.  EF 20% on echo with diffuse hypokinesis.  She has a history of untreated HTN.  Symptoms have been present since 1/16.  No viral-type syndrome.  No drugs/ETOH.  No family history of cardiomypathy.  TSH normal.  No chest pain.  She had not had her BP checked recently prior to admission, mildly elevated initially in the hospital.  She diuresed well with weight down considerably.  Cath with normal cors.  - Now s/p subxiphoid pericardial window.   - Continue Coreg 3.125 mg bid, spironolactone 12.5 daily, and lisinopril 2.5 mg bid as long as SBP >/= 85.   - Hold po Lasix today with CVP 3.  2. HTN: BP now low.  3. Pericardial effusion: Large. No tamponade by echo or cath. Did not improve with diuresis. CT chest without evidence malignancy. S/p pericardial window.  Cytology from pericardial fluid did not show malignancy. Chest tube out, still sore at site.  4. Atrial fibrillation: Paroxysmal.  Atrial fibrillation with RVR 5/3, amiodarone started and she converted to NSR.  Hypotensive on amiodarone gtt so this was stopped.  Continue po amiodarone.  Now that chest tube out, stop ASA and start Eliquis (CHADSVASC = 4 for age/CHF/gender/HTN).   5. Ambulate with PT, can go to telemetry.    Loralie Champagne MD 07/29/2014 7:42 AM

## 2014-07-29 NOTE — Progress Notes (Signed)
ANTICOAGULATION CONSULT NOTE - Initial Consult  Pharmacy Consult for Apixaban Indication: atrial fibrillation  No Known Allergies  Patient Measurements: Height: 5\' 3"  (160 cm) Weight: 101 lb 3.2 oz (45.904 kg) (scale C) IBW/kg (Calculated) : 52.4  Vital Signs: Temp: 98.1 F (36.7 C) (05/05 0700) Temp Source: Oral (05/05 0700) BP: 80/46 mmHg (05/05 0600) Pulse Rate: 69 (05/05 0700)  Labs:  Recent Labs  07/27/14 0611 07/27/14 2310 07/28/14 0500 07/29/14 0244  HGB 12.4  --  11.4* 11.4*  HCT 37.9  --  35.2* 36.0  PLT 267  --  236 203  CREATININE 0.97 1.09* 1.05* 0.79    Estimated Creatinine Clearance: 48.8 mL/min (by C-G formula based on Cr of 0.79).   Medical History: Past Medical History  Diagnosis Date  . Anemia   . Hypertension   . Blood transfusion without reported diagnosis   . Chronic systolic CHF (congestive heart failure)     a. 06/2014 Echo: EF 20%.  Marland Kitchen NICM (nonischemic cardiomyopathy)     a. 06/2014 Echo: EF 20%, diff HK, mild MR, mildly dil LA/RA, mildly reduced RV fxn;  b. 06/2014 Cath: nl cors.  . Pericardial effusion     a. 06/2014 Large effusion, no tamponade phys.    Medications:  Scheduled:  . acetaminophen  500 mg Oral 4 times per day   Or  . acetaminophen (TYLENOL) oral liquid 160 mg/5 mL  500 mg Oral 4 times per day  . amiodarone  200 mg Oral Daily  . apixaban  5 mg Oral BID  . bisacodyl  10 mg Oral Daily  . carvedilol  3.125 mg Oral BID WC  . lisinopril  2.5 mg Oral BID  . senna-docusate  1 tablet Oral QHS  . spironolactone  12.5 mg Oral Daily    Assessment: 61 yoF without prior cardiac history found to be in Afib with RVR late 5/3.  She was converted to NSR on amiodarone with plans to start Apixaban once chest tube was removed (Pericardial effusion).  Chest tube was removed on 5/4.  Pharmacy was consulted to start Apixaban for Afib on 5/5.  SCr 0.79, Age < 80, and Wt. < 60 kg.  Hgb is stable at 11.4, plts are WNL.  Goal of Therapy:   Monitor platelets by anticoagulation protocol: Yes   Plan:  - Start apixaban 5 mg PO BID - Monitor CBC, SCr, signs of bleeding - Apixaban Education  Russ Halo, PharmD Clinical Pharmacist - Resident Pager: [phone] 5/5/20168:20 AM

## 2014-07-29 NOTE — Progress Notes (Signed)
CARDIAC REHAB PHASE I   PRE:  Rate/Rhythm: 61SR  BP:  Supine:   Sitting: 91/58  Standing:    SaO2: 96%RA  MODE:  Ambulation: 390 ft   POST:  Rate/Rhythm: 75SR  BP:  Supine:   Sitting: 108/89  Standing:    SaO2: 99%RA 1030-1110 Pt walked 390 ft on RA with gait belt use and asst x 2. Pt did not want to use rolling walker. C/o left leg weak but it improved with walk and pt excited about how well she did. No DOE. Tolerated well. Encouraged her not to walk by herself but to walk later with staff. Motivated to walk. To wheelchair for transfer.   Luetta Nutting, RN BSN  07/29/2014 11:07 AM

## 2014-07-29 NOTE — Op Note (Signed)
NAMEMarland Peck  [name], [name] NO.:  [id number]  MEDICAL RECORD NO.:  [id number]  LOCATION:  2H12C                        FACILITY:  [facility]  PHYSICIAN:  Sheliah Plane, MD    DATE OF BIRTH:  [birth year]  DATE OF PROCEDURE:  07/26/2014 DATE OF DISCHARGE:                              OPERATIVE REPORT   PREOPERATIVE DIAGNOSIS:  Large pericardial effusion in the setting of congestive heart failure.  POSTOPERATIVE DIAGNOSIS:  Large pericardial effusion in the setting of congestive heart failure.  SURGICAL PROCEDURE:  Subxiphoid pericardial window with drainage of pericardial effusion and pericardial biopsy.  SURGEON:  Sheliah Plane, MD  FIRST ASSISTANT:  Coral Ceo, PA-C  BRIEF HISTORY:  The patient is a 69 year old female admitted with severe congestive heart failure symptoms.  Her symptoms improved with diuresis. Echocardiogram revealed a very large pericardial effusion.  CT scan of the chest did not reveal any evidence of chest masses.  Drainage of the effusion both for diagnostic and therapeutic reasons was recommended to the patient, who agreed and signed informed consent.  DESCRIPTION OF PROCEDURE:  The patient was brought to the operating room in the fasting condition.  She underwent general endotracheal anesthesia without incident.  TEE probe was placed and confirmed a large pericardial effusion.  The chest was prepped with Betadine and draped in usual sterile manner.  A subxiphoid incision was made and dissection was carried down to the anterior pericardium.  The pericardium was opened and approximately 700 mL of straw-colored fluid was removed.  Portions of the fluid were sent for cytology and cultures and chemistry.  In addition, a portion of the pericardium was excised and submitted separately to Pathology.  As we were dissecting along the anterior pericardium, a moderate-sized incidental lymph node was noted and was also sent to Pathology.  A single  Blake drain was left in the inferior of the pericardium, brought out through a separate site on the just to the right of the incision and secured.  Incision was closed with interrupted 0 Vicryl, running 3-0 Vicryl subcutaneous tissue and 3-0 subcuticular stitch.  Dermabond was applied.  The patient was awakened and extubated in the operating room and transferred to the recovery room for postoperative care.  TEE at the completion of the case showed complete resolution of the effusion.  Overall, ejection fraction remained in the 20% range.  Her sponge and needle count was reported as correct at completion of the procedure.    Sheliah Plane, MD    EG/MEDQ  D:  07/28/2014  T:  07/29/2014  Job:  [id number]

## 2014-07-29 NOTE — Progress Notes (Signed)
Patient transferred to 3E06, she is alert and oriented, she has ambulated in hallway with cardiac rehab just prior to transfer, she has had complaints of being sore when moving, but wants to move to see if it loosens up.  All personal belongings taken with her on transfer.

## 2014-07-29 NOTE — Progress Notes (Signed)
Patient ID: Katrina Peck, female   DOB: 12/23/1945, 69 y.o.   MRN: [id number]      [address]       [place] [address]             [phone]                 3 Days Post-Op Procedure(s) (LRB): SUBXYPHOID PERICARDIAL WINDOW (N/A) TRANSESOPHAGEAL ECHOCARDIOGRAM (TEE) (N/A) DRAINAGE OF PERICARDIAL EFFUSION (N/A)  LOS: 9 days   Subjective: Says breathing better  Objective: Vital signs in last 24 hours: Patient Vitals for the past 24 hrs:  BP Temp Temp src Pulse Resp SpO2  07/29/14 0800 - 98.1 F (36.7 C) Oral 62 - 100 %  07/29/14 0700 - - - 69 - -  07/29/14 0600 (!) 80/46 mmHg - - - 14 -  07/29/14 0500 93/61 mmHg - - - 16 -  07/29/14 0400 (!) 90/54 mmHg 98 F (36.7 C) Oral - 14 99 %  07/29/14 0300 96/62 mmHg - - - 20 -  07/29/14 0200 (!) 97/56 mmHg - - - 17 -  07/29/14 0100 (!) 86/56 mmHg - - - 14 -  07/29/14 0000 (!) 87/47 mmHg 99 F (37.2 C) Oral - 15 100 %  07/28/14 2300 (!) 82/43 mmHg - - - 14 -  07/28/14 2200 (!) 97/53 mmHg - - - 17 -  07/28/14 2100 (!) 97/48 mmHg - - - 19 -  07/28/14 2000 (!) 96/58 mmHg 99.1 F (37.3 C) Oral - (!) 23 100 %  07/28/14 1900 (!) 87/42 mmHg - - - 16 -  07/28/14 1800 (!) 99/56 mmHg - - - 17 -  07/28/14 1700 (!) 91/54 mmHg - - - 19 -  07/28/14 1630 - 98.3 F (36.8 C) Oral - - -  07/28/14 1600 (!) 100/59 mmHg - - - 19 -  07/28/14 1548 - - - - - 100 %  07/28/14 1530 98/60 mmHg - - - 16 -  07/28/14 1200 134/89 mmHg - - - (!) 27 100 %  07/28/14 1130 - 98 F (36.7 C) Oral - - -  07/28/14 1100 (!) 96/53 mmHg - - - 16 -  07/28/14 1030 (!) 82/46 mmHg - - 64 14 99 %  07/28/14 1000 (!) 74/43 mmHg - - 61 15 99 %  07/28/14 0930 (!) 84/43 mmHg - - (!) 59 12 99 %  07/28/14 0900 94/65 mmHg - - 70 20 100 %    Filed Weights   07/24/14 0600 07/25/14 0607 07/26/14 0609  Weight: 98 lb 14.4 oz (44.861 kg) 101 lb 12.8 oz (46.176 kg) 101 lb 3.2 oz (45.904 kg)    Hemodynamic parameters for last 24 hours: CVP:  [2 mmHg-4 mmHg] 3  mmHg  Intake/Output from previous day: 05/04 0701 - 05/05 0700 In: 960 [P.O.:960] Out: 1000 [Urine:1000] Intake/Output this shift: Total I/O In: 240 [P.O.:240] Out: -   Scheduled Meds: . acetaminophen  500 mg Oral 4 times per day   Or  . acetaminophen (TYLENOL) oral liquid 160 mg/5 mL  500 mg Oral 4 times per day  . amiodarone  200 mg Oral Daily  . apixaban  5 mg Oral BID  . bisacodyl  10 mg Oral Daily  . carvedilol  3.125 mg Oral BID WC  . lisinopril  2.5 mg Oral BID  . senna-docusate  1 tablet Oral QHS  . spironolactone  12.5 mg Oral Daily   Continuous Infusions:  PRN  Meds:.fentaNYL (SUBLIMAZE) injection, ondansetron (ZOFRAN) IV, oxyCODONE, traMADol    Lab Results: CBC: Recent Labs  07/28/14 0500 07/29/14 0244  WBC 7.6 7.0  HGB 11.4* 11.4*  HCT 35.2* 36.0  PLT 236 203   BMET:  Recent Labs  07/28/14 0500 07/29/14 0244  NA 134* 137  K 4.3 4.1  CL 100* 107  CO2 27 25  GLUCOSE 114* 102*  BUN 24* 19  CREATININE 1.05* 0.79  CALCIUM 7.9* 8.1*    PT/INR: No results for input(s): LABPROT, INR in the last 72 hours.   Radiology Dg Chest Port 1 View  07/28/2014   CLINICAL DATA:  Pericardial effusion.  Dyspnea.  EXAM: PORTABLE CHEST - 1 VIEW  COMPARISON:  07/27/2014  FINDINGS: There is no change in the enlarged cardiac silhouette. There is a right jugular central line extending to the cavoatrial junction. There is no pneumothorax. There is persistent consolidation and effusion in the left base. There is persistent mild ground-glass opacity in the right base. There is a very small right effusion.  IMPRESSION: Unchanged cardiomegaly. Left base consolidation and small effusions persist without significant interval change.   Electronically Signed   By: Ellery Plunk M.D.   On: 07/28/2014 05:43     Assessment/Plan: S/P Procedure(s) (LRB): SUBXYPHOID PERICARDIAL WINDOW (N/A) TRANSESOPHAGEAL ECHOCARDIOGRAM (TEE) (N/A) DRAINAGE OF PERICARDIAL EFFUSION (N/A) Stable  after drainage of pericardium Tubes out Please call for further assistance   Delight Ovens MD 07/29/2014 8:51 AM

## 2014-07-30 ENCOUNTER — Other Ambulatory Visit (HOSPITAL_COMMUNITY): Payer: Medicare Other

## 2014-07-30 ENCOUNTER — Inpatient Hospital Stay (HOSPITAL_COMMUNITY): Payer: Medicare Other

## 2014-07-30 ENCOUNTER — Other Ambulatory Visit (HOSPITAL_COMMUNITY): Payer: Self-pay | Admitting: Anesthesiology

## 2014-07-30 DIAGNOSIS — I313 Pericardial effusion (noninflammatory): Secondary | ICD-10-CM

## 2014-07-30 DIAGNOSIS — I3139 Other pericardial effusion (noninflammatory): Secondary | ICD-10-CM

## 2014-07-30 LAB — CBC
HEMATOCRIT: 35.5 % — AB (ref 36.0–46.0)
HEMOGLOBIN: 11.4 g/dL — AB (ref 12.0–15.0)
MCH: 27.9 pg (ref 26.0–34.0)
MCHC: 32.1 g/dL (ref 30.0–36.0)
MCV: 87 fL (ref 78.0–100.0)
Platelets: 247 10*3/uL (ref 150–400)
RBC: 4.08 MIL/uL (ref 3.87–5.11)
RDW: 14.8 % (ref 11.5–15.5)
WBC: 6.5 10*3/uL (ref 4.0–10.5)

## 2014-07-30 LAB — BASIC METABOLIC PANEL
Anion gap: 7 (ref 5–15)
BUN: 19 mg/dL (ref 6–20)
CALCIUM: 8.5 mg/dL — AB (ref 8.9–10.3)
CHLORIDE: 106 mmol/L (ref 101–111)
CO2: 26 mmol/L (ref 22–32)
CREATININE: 0.86 mg/dL (ref 0.44–1.00)
GFR calc non Af Amer: >60 mL/min (ref >60)
Glucose, Bld: 99 mg/dL (ref 70–99)
Potassium: 4.3 mmol/L (ref 3.5–5.1)
SODIUM: 139 mmol/L (ref 135–145)

## 2014-07-30 MED ORDER — FUROSEMIDE 20 MG PO TABS
20.0000 mg | ORAL_TABLET | Freq: Every day | ORAL | Status: DC
  Administered 2014-07-30: 20 mg via ORAL
  Filled 2014-07-30: qty 1

## 2014-07-30 MED ORDER — LISINOPRIL 2.5 MG PO TABS: 2.5000 mg | ORAL_TABLET | Freq: Two times a day (BID) | ORAL | Status: DC

## 2014-07-30 MED ORDER — CARVEDILOL 3.125 MG PO TABS: 3.1250 mg | ORAL_TABLET | Freq: Two times a day (BID) | ORAL | Status: DC

## 2014-07-30 MED ORDER — FUROSEMIDE 20 MG PO TABS: 20.0000 mg | ORAL_TABLET | Freq: Every day | ORAL | Status: DC

## 2014-07-30 MED ORDER — AMIODARONE HCL 100 MG PO TABS: 200.0000 mg | ORAL_TABLET | Freq: Every day | ORAL | Status: DC

## 2014-07-30 MED ORDER — APIXABAN 5 MG PO TABS: 5.0000 mg | ORAL_TABLET | Freq: Two times a day (BID) | ORAL | Status: DC

## 2014-07-30 MED ORDER — SPIRONOLACTONE 25 MG PO TABS: 12.5000 mg | ORAL_TABLET | Freq: Every day | ORAL | Status: DC

## 2014-07-30 NOTE — Discharge Summary (Signed)
Physician Discharge Summary  Patient ID: Katrina Peck MRN: [id number] DOB/AGE: [birth year] 69 y.o.   Primary Cardiologist: Dr. Shirlee Latch   Admit date: 07/20/2014 Discharge date: 07/30/2014  Admission Diagnoses: Acute Systolic CHF   Discharge Diagnoses:  Principal Problem:   Acute systolic CHF (congestive heart failure), NYHA class 4 Active Problems:   NICM (nonischemic cardiomyopathy)   Essential hypertension   Pericardial effusion   Discharged Condition: stable  HPI: 69 y/o female with history of HTN was admitted 4/26 with 3 months of exertional dyspnea, weight gain, cough, and orthopnea. Admitted for acute on chronic systolic HF, also found to have a pericardial effusion. Hospital course complicated by atrial fibrillation w/ RVR.   Hospital Course:   1. Acute on Chronic Systolic CHF: pBNP was elevated @ 3400 on admit and CXR demonstrated CHF with cardiac enlargement and bilateral pleural effusions and interstitial edema. 2D echo 4/27 demonstrated severe systolic dysfunction with EF at 20% and diffuse hypokinesis. No viral-type syndrome. No drugs/ETOH. No family history of cardiomypathy. TSH was normal. No chest pain. Cardiac enzymes were negative.  She was treated with IV diuretic therapy and had good diuresis. Net negative 7.6L total. Symptoms improved after diuresis. On 4/29 she underwent a R/LHC to assess right heart pressures and to rule-out underlying CAD. Filling pressures normalized and cardiac output was preserved.She had no CAD, suggesting nonischemic cardiomyopathy. No post cath complications. She was placed appropriate HF medical regimen: Coreg, lisinopril,  Spironolactone and lasix. Discharge weight was 94 lb.   2. Pericardial Effusion: 2D echo 4/27 demonstrated a large pericardial effusion circumferential to the heart. There was no objective signs of tamponade, however given her symptoms it was felt that she would benefit from drainage. CT surgery was consulted. She  underwent successful subxiphoid pericardial window with drainage of pericardial effusion and pericardial biopsy by Dr. Tyrone Sage on 07/29/14. Approximately 700 mL of straw-colored fluid was removed. Cytology from pericardial fluid did not show malignancy. F/U limited echo showed resolution. Drain was removed. F/u has been arranged with Dr. Tyrone Sage on 08/16/14.   3. Atrial Fibrillation: patient had PAF during hospitalization and went into a RVR on 5/3. She was started on IV amiodarone and converted to NSR. She was converted to PO amiodarone on a maintenance dose of 100 mg daily. Given elevated CHA2DS2 VASc score of 4 (age/CHF/gender/HTN), she was placed on Eliquis, 5 mg BID.   4. HTN: BP well controlled/ stable day of discharge.   5. Functional Limitations: patient underwent physical therapy assessment. Due to limitations, Home Health PT was recommended and arranged prior to discharge.   6: Dispo: patient was last seen and examined by Dr. Shirlee Latch who determined she was stable for discharge home. Home health PT was ordered. Meds ordered for discharge: Coreg 3.125 mg bid, amiodarone 100 mg daily, lisinopril 2.5 mg bid, spironolactone 12.5 mg daily, Eliquis 5 mg bid, Lasix 20 mg daily. 1 week post hospital f/u arranged with Dr. Shirlee Latch on 5/13 at CHF clinic.    Consults: CT Surgery  Significant Diagnostic Studies:  2D Echo 07/21/14 Study Conclusions  - Left ventricle: The cavity size was mildly dilated. Wall thickness was normal. The estimated ejection fraction was 20%. Diffuse hypokinesis. - Mitral valve: There was mild regurgitation. - Left atrium: The atrium was mildly dilated. - Right ventricle: The cavity size was mildly dilated. Systolic function was mildly reduced. - Right atrium: The atrium was mildly dilated. - Pericardium, extracardiac: A large pericardial effusion was identified circumferential to the heart. Difficult to  assess hemodynamic significance.  Impressions:  -  There is also a pleural effusion.  [facility] 07/23/14 Procedural Findings: Hemodynamics (mmHg) RA mean 6 RV 27/6 PA 28/11, mean 18 PCWP mean 9  LV 96/11 AO 100/65  Simultaneous RV and LV pressure tracings did not show significant ventricular interdependence. There was no significant respirophasic variation of the LV pressure tracing (no more than 4-5 mmHg variance).   Oxygen saturations: PA 73% AO 95%  Cardiac Output (Fick) 4.92  Cardiac Index (Fick) 3.39  Coronary angiography: Coronary dominance: right  Left mainstem: Short, no significant disease.   Left anterior descending (LAD): No angiographic coronary disease.   Left circumflex (LCx): No angiographic coronary disease.   Right coronary artery (RCA): No angiographic coronary disease.   Left ventriculography: Not done, recent echo.  Final Conclusions: Filling pressures normalized and cardiac output preserved. No CAD, nonischemic cardiomyopathy. Hemodynamics were not consistent with pericardial tamponade, suspect the pericardial effusion is not hemodynamically significant.    Treatments:  See Hospital Course  Discharge Exam: Blood pressure 115/70, pulse 67, temperature 98.1 F (36.7 C), temperature source Oral, resp. rate 20, height 5\' 2"  (1.575 m), weight 94 lb 3.2 oz (42.729 kg), SpO2 99 %.   Disposition: 01-Home or Self Care      Discharge Instructions    Amb Referral to Cardiac Rehabilitation    Complete by:  As directed   Congestive Heart Failure: If diagnosis is Heart Failure, patient MUST meet each of the CMS criteria: 1. Left Ventricular Ejection Fraction </= 35% 2. NYHA class II-IV symptoms despite being on optimal heart failure therapy for at least 6 weeks. 3. Stable = have not had a recent (<6 weeks) or planned (<6 months) major cardiovascular hospitalization or procedure  Program Details: - Physician supervised classes - 1-3 classes per week over a 12-18 week period, generally for a  total of 36 sessions  Physician Certification: I certify that the above Cardiac Rehabilitation treatment is medically necessary and is medically approved by me for treatment of this patient. The patient is willing and cooperative, able to ambulate and medically stable to participate in exercise rehabilitation. The participant's progress and Individualized Treatment Plan will be reviewed by the Medical Director, Cardiac Rehab staff and as indicated by the Referring/Ordering Physician.  Diagnosis:  Heart Failure (see criteria below)     Diet - low sodium heart healthy    Complete by:  As directed      Face-to-face encounter (required for Medicare/Medicaid patients)    Complete by:  As directed   I SIMMONS, BRITTAINY certify that this patient is under my care and that I, or a nurse practitioner or physician's assistant working with me, had a face-to-face encounter that meets the physician face-to-face encounter requirements with this patient on 07/30/2014. The encounter with the patient was in whole, or in part for the following medical condition(s) which is the primary reason for home health care (List medical condition): heart failure  The encounter with the patient was in whole, or in part, for the following medical condition, which is the primary reason for home health care:  heart failure  I certify that, based on my findings, the following services are medically necessary home health services:  Physical therapy  Reason for Medically Necessary Home Health Services:  Skilled Nursing- Change/Decline in Patient Status  My clinical findings support the need for the above services:  Unable to leave home safely without assistance and/or assistive device  Further, I certify that my clinical findings  support that this patient is homebound due to:  Unsafe ambulation due to balance issues     For home use only DME 4 wheeled rolling walker with seat    Complete by:  As directed      Home Health    Complete  by:  As directed   To provide the following care/treatments:  PT     Increase activity slowly    Complete by:  As directed             Medication List    TAKE these medications        amiodarone 100 MG tablet  Commonly known as:  PACERONE  Take 2 tablets (200 mg total) by mouth daily.     apixaban 5 MG Tabs tablet  Commonly known as:  ELIQUIS  Take 1 tablet (5 mg total) by mouth 2 (two) times daily.     carvedilol 3.125 MG tablet  Commonly known as:  COREG  Take 1 tablet (3.125 mg total) by mouth 2 (two) times daily with a meal.     furosemide 20 MG tablet  Commonly known as:  LASIX  Take 1 tablet (20 mg total) by mouth daily.     lisinopril 2.5 MG tablet  Commonly known as:  PRINIVIL,ZESTRIL  Take 1 tablet (2.5 mg total) by mouth 2 (two) times daily.     spironolactone 25 MG tablet  Commonly known as:  ALDACTONE  Take 0.5 tablets (12.5 mg total) by mouth daily.     triamcinolone lotion 0.1 %  Commonly known as:  KENALOG  Apply sparsely twice daily to rash       Follow-up Information    Follow up with Delight Ovens, MD On 08/16/2014.   Specialty:  Cardiothoracic Surgery   Why:  PA/LAT CXR to be taken (at [name] which is in the same building as Dr. Dennie Maizes office) on 08/16/2014 at 12:30 pm ;Appointment time is at 1:30 pm   Contact information:   [address] [employer] [address] [place] [address] [phone]       Follow up with [name], MD On 08/06/2014.   Specialty:  Cardiology   Why:  8:40 am    Contact information:   [address] [place] [address] [place] [address] [phone]      TIME SPENT ON DISCHARGE, INCLUDING PHYSICIAN TIME: > 30 MINUTES  Signed: SIMMONS, BRITTAINY 07/30/2014, 12:13 PM

## 2014-07-30 NOTE — Progress Notes (Signed)
Discussed with the pt d/c instructions including follow up visits, medication purposes and times, home care, and activity limitations.  Pt states she understands all information.  Spoke with the husband via telephone, husband states he will pick pt up at 4:30pm.  Pt alertx4 no c/o pain.  Only belongings are clothing that will be going home with pt.  All LDAs completed.

## 2014-07-30 NOTE — Progress Notes (Signed)
Patient ID: Katrina Peck, female   DOB: 06/27/1945, 69 y.o.   MRN: [id number]    Patient with history of HTN was admitted 4/26 with 3 months of exertional dyspnea, weight gain, cough, and orthopnea.  Echo shows EF 20% with diffuse hypokinesis and mildly decreased RV systolic function with large pericardial effusion.  Underwent cath with normal hemdynamics and normal cors.   F/u echo after diuresis showed persistent large pericardial effusion. CT scan chest with small pleural effusion. No malignancy. ESR 5  On 5/2, she went for subxiphoid pericardial window.  Cytology with no malignant cells.     On 5/3, went into atrial fibrillation with RVR.  Amiodarone gtt started, she converted back to NSR.  Became hypotensive on amiodarone gtt so it was stopped.  Chest tube removed 5/4.  Still soreness at chest tube site but better.  No dyspnea.  Walking without difficulty.   Scheduled Meds: . acetaminophen  500 mg Oral 4 times per day   Or  . acetaminophen (TYLENOL) oral liquid 160 mg/5 mL  500 mg Oral 4 times per day  . amiodarone  200 mg Oral Daily  . apixaban  5 mg Oral BID  . bisacodyl  10 mg Oral Daily  . carvedilol  3.125 mg Oral BID WC  . furosemide  20 mg Oral Daily  . lisinopril  2.5 mg Oral BID  . senna-docusate  1 tablet Oral QHS  . sodium chloride  10-40 mL Intracatheter Q12H  . spironolactone  12.5 mg Oral Daily   Continuous Infusions:   PRN Meds:.fentaNYL (SUBLIMAZE) injection, ondansetron (ZOFRAN) IV, oxyCODONE, sodium chloride, traMADol    Filed Vitals:   07/29/14 1427 07/29/14 2115 07/29/14 2231 07/30/14 0659  BP: 98/74 85/56 99/58  104/64  Pulse: 66 64  63  Temp: 98.6 F (37 C) 98.1 F (36.7 C)  98.1 F (36.7 C)  TempSrc: Oral Oral  Oral  Resp: 18 18  20   Height:      Weight:    94 lb 3.2 oz (42.729 kg)  SpO2: 100% 99%  99%    Intake/Output Summary (Last 24 hours) at 07/30/14 0802 Last data filed at 07/30/14 0600  Gross per 24 hour  Intake    360 ml  Output     400 ml  Net    -40 ml    LABS: Basic Metabolic Panel:  Recent Labs  07/27/14 2310  07/29/14 0244 07/30/14 0410  NA 134*  < > 137 139  K 4.3  < > 4.1 4.3  CL 98*  < > 107 106  CO2 27  < > 25 26  GLUCOSE 214*  < > 102* 99  BUN 25*  < > 19 19  CREATININE 1.09*  < > 0.79 0.86  CALCIUM 8.2*  < > 8.1* 8.5*  MG 1.8  --   --   --   PHOS 3.2  --   --   --   < > = values in this interval not displayed. Liver Function Tests:  Recent Labs  07/28/14 0500  AST 25  ALT 19  ALKPHOS 79  BILITOT 0.4  PROT 5.5*  ALBUMIN 2.0*   No results for input(s): LIPASE, AMYLASE in the last 72 hours. CBC:  Recent Labs  07/29/14 0244 07/30/14 0410  WBC 7.0 6.5  HGB 11.4* 11.4*  HCT 36.0 35.5*  MCV 87.2 87.0  PLT 203 247   Cardiac Enzymes: No results for input(s): CKTOTAL, CKMB, CKMBINDEX, TROPONINI in the last  72 hours. BNP: Invalid input(s): POCBNP D-Dimer: No results for input(s): DDIMER in the last 72 hours. Hemoglobin A1C: No results for input(s): HGBA1C in the last 72 hours. Fasting Lipid Panel: No results for input(s): CHOL, HDL, LDLCALC, TRIG, CHOLHDL, LDLDIRECT in the last 72 hours. Thyroid Function Tests: No results for input(s): TSH, T4TOTAL, T3FREE, THYROIDAB in the last 72 hours.  Invalid input(s): FREET3 Anemia Panel: No results for input(s): VITAMINB12, FOLATE, FERRITIN, TIBC, IRON, RETICCTPCT in the last 72 hours.  RADIOLOGY: Dg Chest 2 View  07/25/2014   CLINICAL DATA:  Preoperative cardiovascular examination chest x-ray history of congestive heart failure hypertension pericardial effusion  EXAM: CHEST  2 VIEW  COMPARISON:  07/24/2014  FINDINGS: Severe enlargement of cardiac silhouette consistent with known large pericardial effusion. The vascular pattern is normal.  There is an element of hyperinflation suggesting COPD. Known small right pleural effusion. Bony thorax intact.  IMPRESSION: Severe cardiac silhouette enlargement consistent with known pericardial  effusion.   Electronically Signed   By: Skipper Cliche M.D.   On: 07/25/2014 17:58   Dg Chest 2 View  07/20/2014   CLINICAL DATA:  Short  of breath and weakness for 2 weeks  EXAM: CHEST  2 VIEW  COMPARISON:  None.  FINDINGS: There is moderate cardiac enlargement. Bilateral pleural effusions and interstitial edema is identified. Decreased aeration to lung bases likely represents atelectasis. Mild degenerative disc disease within the thoracic spine.  IMPRESSION: 1. Cardiac enlargement and CHF.   Electronically Signed   By: Kerby Moors M.D.   On: 07/20/2014 19:01   Ct Chest Wo Contrast  07/24/2014   CLINICAL DATA:  Pericardial effusion.  EXAM: CT CHEST WITHOUT CONTRAST  TECHNIQUE: Multidetector CT imaging of the chest was performed following the standard protocol without IV contrast.  COMPARISON:  Chest x-ray 07/20/2014  FINDINGS: Heart: There is large pericardial effusion. Effusion measures 2.6 cm and appears low attenuation. There is aortic valve calcification. No significant coronary artery calcifications.  Vascular structures: Minimal atherosclerosis of the thoracic aorta. No aneurysm.  Mediastinum/thyroid: The visualized portion of the thyroid gland has a normal appearance. No mediastinal, hilar, or axillary adenopathy.  Lungs/Airways: Airways are patent. There is a right pleural effusion. There are no focal consolidations. No pulmonary edema. Minimal left lower lobe atelectasis.  Upper abdomen: Unremarkable.  Chest wall/osseous structures: No suspicious lytic or blastic lesions are identified.  IMPRESSION: 1. Large pericardial effusion. 2. Small right pleural effusion.   Electronically Signed   By: Nolon Nations M.D.   On: 07/24/2014 18:24   Dg Chest Port 1 View  07/28/2014   CLINICAL DATA:  Pericardial effusion.  Dyspnea.  EXAM: PORTABLE CHEST - 1 VIEW  COMPARISON:  07/27/2014  FINDINGS: There is no change in the enlarged cardiac silhouette. There is a right jugular central line extending to the  cavoatrial junction. There is no pneumothorax. There is persistent consolidation and effusion in the left base. There is persistent mild ground-glass opacity in the right base. There is a very small right effusion.  IMPRESSION: Unchanged cardiomegaly. Left base consolidation and small effusions persist without significant interval change.   Electronically Signed   By: Andreas Newport M.D.   On: 07/28/2014 05:43   Dg Chest Port 1 View  07/27/2014   CLINICAL DATA:  Pericardial effusion.  EXAM: PORTABLE CHEST - 1 VIEW  COMPARISON:  07/26/2014  FINDINGS: There is stable cardiomegaly. The right jugular central line is unchanged, extending to the cavoatrial junction. Tubing overlies the epigastric  region and lower midline chest. Pleural effusions are present bilaterally, small. There is no pneumothorax.  IMPRESSION: Small pleural effusions, new or enlarged. Unchanged cardiac silhouette. Right jugular central line appears satisfactorily positioned.   Electronically Signed   By: Andreas Newport M.D.   On: 07/27/2014 06:27   Dg Chest Port 1 View  07/26/2014   CLINICAL DATA:  Congestive heart failure .  EXAM: PORTABLE CHEST - 1 VIEW  COMPARISON:  07/25/2014.  FINDINGS: Right IJ line in stable position. Surgical tubing noted over the lower chest. Cardiomegaly with pulmonary vascular prominence and diffuse interstitial prominence consistent congestive heart failure. Findings have progressed slightly from prior exam. Small bilateral effusions.  IMPRESSION: 1. Right IJ line noted with tip projected over the cavoatrial junction.  2. Congestive heart failure with pulmonary interstitial edema and small pleural effusions. Changes of congestive heart failure have progressed from prior exam .   Electronically Signed   By: [place]   On: 07/26/2014 09:40    PHYSICAL EXAM General: NAD Neck: JVP 7 cm, no thyromegaly or thyroid nodule.  Lungs: Decreased breath sounds at bases bilaterally.  CV: Lateral PMI.  Heart  regular S1/S2, no S3, no murmur.  1+ woody edema at ankles   Abdomen: Soft, nontender, no hepatosplenomegaly, no distention.  Neurologic: Alert and oriented x 3.  Psych: Normal affect. Extremities: No clubbing or cyanosis.   TELEMETRY: Reviewed telemetry pt in NSR  ASSESSMENT AND PLAN: 69 yo with history of HTN was admitted 4/26 with 3 months of exertional dyspnea, weight gain, cough, and orthopnea.  1. Acute/chronic systolic CHF: Patient was admitted with NYHA class IIIb-IV symptoms and significant volume overload with S3 on exam.  EF 20% on echo with diffuse hypokinesis.  She has a history of untreated HTN.  Symptoms have been present since 1/16.  No viral-type syndrome.  No drugs/ETOH.  No family history of cardiomypathy.  TSH normal.  No chest pain.  She had not had her BP checked recently prior to admission, mildly elevated initially in the hospital.  She diuresed well with weight down considerably.  Cath with normal cors.  - Now s/p subxiphoid pericardial window.   - Continue Coreg 3.125 mg bid, spironolactone 12.5 daily, and lisinopril 2.5 mg bid as long as SBP >/= 85.   - Can begin on Lasix 20 mg daily which will be her chronic dose.  2. HTN: BP now on the lower side.  3. Pericardial effusion: Large. No tamponade by echo or cath. Did not improve with diuresis. CT chest without evidence malignancy. S/p pericardial window.  Cytology from pericardial fluid did not show malignancy. Chest tube out, still sore at site. Limited echo today prior to discharge to reassess for effusion post-window.   4. Atrial fibrillation: Paroxysmal.  Atrial fibrillation with RVR 5/3, amiodarone started and she converted to NSR.  Hypotensive on amiodarone gtt so this was stopped.  Continue po amiodarone.  Now that chest tube out, stop ASA and start Eliquis (CHADSVASC = 4 for age/CHF/gender/HTN).   5. Disposition: I think that she can go home today.  Home health/PT needed.  Will need appt with me in CHF clinic in 1  week. Meds: Coreg 3.125 mg bid, amiodarone 100 mg daily, lisinopril 2.5 mg bid, spironolactone 12.5 mg daily, Eliquis 5 mg bid, Lasix 20 mg daily.   Loralie Champagne MD 07/30/2014 8:02 AM

## 2014-07-30 NOTE — Progress Notes (Signed)
Echocardiogram 2D [employer] exam has been performed.  Katrina Peck 07/30/2014, 10:26 AM

## 2014-07-30 NOTE — Discharge Instructions (Signed)
Information on my medicine - ELIQUIS® (apixaban) ° °This medication education was reviewed with me or my healthcare representative as part of my discharge preparation.  The pharmacist that spoke with me during my hospital stay was:  Fumio Vandam C, RPH ° °Why was Eliquis® prescribed for you? °Eliquis® was prescribed for you to reduce the risk of forming blood clots that can cause a stroke if you have a medical condition called atrial fibrillation (a type of irregular heartbeat) OR to reduce the risk of a blood clots forming after orthopedic surgery. ° °What do You need to know about Eliquis® ? °Take your Eliquis® TWICE DAILY - one tablet in the morning and one tablet in the evening with or without food.  It would be best to take the doses about the same time each day. ° °If you have difficulty swallowing the tablet whole please discuss with your pharmacist how to take the medication safely. ° °Take Eliquis® exactly as prescribed by your doctor and DO NOT stop taking Eliquis® without talking to the doctor who prescribed the medication.  Stopping may increase your risk of developing a new clot or stroke.  Refill your prescription before you run out. ° °After discharge, you should have regular check-up appointments with your healthcare provider that is prescribing your Eliquis®.  In the future your dose may need to be changed if your kidney function or weight changes by a significant amount or as you get older. ° °What do you do if you miss a dose? °If you miss a dose, take it as soon as you remember on the same day and resume taking twice daily.  Do not take more than one dose of ELIQUIS at the same time. ° °Important Safety Information °A possible side effect of Eliquis® is bleeding. You should call your healthcare provider right away if you experience any of the following: °? Bleeding from an injury or your nose that does not stop. °? Unusual colored urine (red or dark brown) or unusual colored stools (red or  black). °? Unusual bruising for unknown reasons. °? A serious fall or if you hit your head (even if there is no bleeding). ° °Some medicines may interact with Eliquis® and might increase your risk of bleeding or clotting while on Eliquis®. To help avoid this, consult your healthcare provider or pharmacist prior to using any new prescription or non-prescription medications, including herbals, vitamins, non-steroidal anti-inflammatory drugs (NSAIDs) and supplements. ° °This website has more information on Eliquis® (apixaban): www.Eliquis.com. ° ° °

## 2014-07-30 NOTE — Progress Notes (Signed)
PT Cancellation Note  Patient Details Name: Katrina Peck MRN: [id number] DOB: 05-17-1945   Cancelled Treatment:    Reason Eval/Treat Not Completed: Other (comment) (Refused PT.  Has been up walking in room without device per pt.) Leaving today per pt.  Pt wants to transition to Outpt PT but pt agrees to a few sessions of HHPT.  Will return as able.  Thanks.    Tawni Millers F 07/30/2014, 3:06 PM  [facility] [phone] (pager)

## 2014-07-30 NOTE — Progress Notes (Signed)
[phone] Offered to walk with pt but declined. Wanted to rest before going home.  Education completed re CHF zones,when to call MD,sodium restriction, fluid restriction, ex ed. Pt insistent that I call daughter and let her know that she will need her rest when she gets home and not a lot of extended family visiting. Called her daughter and let her know pt's wish. Gave OFF the BEAT booklet and discussed reasoning for eliquis.  Discussed CRP 2 and pt would like to attend if she meets criteria for Medicare to cover. Will refer to [facility] program. Luetta Nutting RN BSN 07/30/2014 12:15 PM

## 2014-07-31 LAB — ANAEROBIC CULTURE

## 2014-08-03 DIAGNOSIS — I309 Acute pericarditis, unspecified: Secondary | ICD-10-CM | POA: Diagnosis not present

## 2014-08-03 DIAGNOSIS — I10 Essential (primary) hypertension: Secondary | ICD-10-CM | POA: Diagnosis not present

## 2014-08-03 DIAGNOSIS — I5023 Acute on chronic systolic (congestive) heart failure: Secondary | ICD-10-CM | POA: Diagnosis not present

## 2014-08-03 DIAGNOSIS — I4891 Unspecified atrial fibrillation: Secondary | ICD-10-CM | POA: Diagnosis not present

## 2014-08-06 ENCOUNTER — Ambulatory Visit (HOSPITAL_COMMUNITY)
Admit: 2014-08-06 | Discharge: 2014-08-06 | Disposition: A | Discharge status: Home / Self Care | Payer: Medicare Other | Source: Ambulatory Visit | Attending: Internal Medicine | Admitting: Internal Medicine

## 2014-08-06 ENCOUNTER — Encounter (HOSPITAL_COMMUNITY): Payer: Self-pay

## 2014-08-06 VITALS — BP 106/63 | HR 74 | Resp 18 | Wt 101.5 lb

## 2014-08-06 DIAGNOSIS — Z79899 Other long term (current) drug therapy: Secondary | ICD-10-CM | POA: Insufficient documentation

## 2014-08-06 DIAGNOSIS — I10 Essential (primary) hypertension: Secondary | ICD-10-CM | POA: Diagnosis not present

## 2014-08-06 DIAGNOSIS — I480 Paroxysmal atrial fibrillation: Secondary | ICD-10-CM | POA: Insufficient documentation

## 2014-08-06 DIAGNOSIS — I5021 Acute systolic (congestive) heart failure: Secondary | ICD-10-CM | POA: Diagnosis not present

## 2014-08-06 DIAGNOSIS — I313 Pericardial effusion (noninflammatory): Secondary | ICD-10-CM

## 2014-08-06 DIAGNOSIS — I3139 Other pericardial effusion (noninflammatory): Secondary | ICD-10-CM

## 2014-08-06 DIAGNOSIS — I5022 Chronic systolic (congestive) heart failure: Secondary | ICD-10-CM | POA: Diagnosis not present

## 2014-08-06 DIAGNOSIS — Z833 Family history of diabetes mellitus: Secondary | ICD-10-CM | POA: Insufficient documentation

## 2014-08-06 DIAGNOSIS — Z8249 Family history of ischemic heart disease and other diseases of the circulatory system: Secondary | ICD-10-CM | POA: Insufficient documentation

## 2014-08-06 DIAGNOSIS — I429 Cardiomyopathy, unspecified: Secondary | ICD-10-CM | POA: Insufficient documentation

## 2014-08-06 DIAGNOSIS — Z7902 Long term (current) use of antithrombotics/antiplatelets: Secondary | ICD-10-CM | POA: Diagnosis not present

## 2014-08-06 DIAGNOSIS — I319 Disease of pericardium, unspecified: Secondary | ICD-10-CM | POA: Diagnosis not present

## 2014-08-06 LAB — BASIC METABOLIC PANEL
Anion gap: 6 (ref 5–15)
BUN: 17 mg/dL (ref 6–20)
CALCIUM: 8.9 mg/dL (ref 8.9–10.3)
CHLORIDE: 106 mmol/L (ref 101–111)
CO2: 26 mmol/L (ref 22–32)
Creatinine, Ser: 1 mg/dL (ref 0.44–1.00)
GFR, EST NON AFRICAN AMERICAN: 57 mL/min — AB (ref >60)
GLUCOSE: 98 mg/dL (ref 65–99)
POTASSIUM: 4.6 mmol/L (ref 3.5–5.1)
SODIUM: 138 mmol/L (ref 135–145)

## 2014-08-06 MED ORDER — FUROSEMIDE 20 MG PO TABS: 40.0000 mg | ORAL_TABLET | Freq: Two times a day (BID) | ORAL | Status: DC

## 2014-08-06 NOTE — Progress Notes (Signed)
ADVANCED HF CLINIC NOTE HPI:  69 y/o female with history of HTN who was admitted 4/26 with 3 months of exertional dyspnea, weight gain, cough, and orthopnea. Found to have EF 20% and a large pericardial effusion. Cath 5/16 No CAD. Hospital course complicated by atrial fibrillation w/ RVR. Underwent pericardial window (transudate)  Weight at d/c was 94.   Here with her husband. She feels she is doing well. Weight up 7 pounds. Eating a lot. Drinking a lot of water. Breathing better. Able to do all ADLs. No palpitations. Weighing at home but scale does not work well.    Past Medical History  Diagnosis Date  . Anemia   . Hypertension   . Blood transfusion without reported diagnosis   . Chronic systolic CHF (congestive heart failure)     a. 06/2014 Echo: EF 20%.  Marland Kitchen NICM (nonischemic cardiomyopathy)     a. 06/2014 Echo: EF 20%, diff HK, mild MR, mildly dil LA/RA, mildly reduced RV fxn;  b. 06/2014 Cath: nl cors.  . Pericardial effusion     a. 06/2014 Large effusion, no tamponade phys.    Current Outpatient Prescriptions  Medication Sig Dispense Refill  . amiodarone (PACERONE) 100 MG tablet Take 2 tablets (200 mg total) by mouth daily. 30 tablet 5  . apixaban (ELIQUIS) 5 MG TABS tablet Take 1 tablet (5 mg total) by mouth 2 (two) times daily. 60 tablet 5  . carvedilol (COREG) 3.125 MG tablet Take 1 tablet (3.125 mg total) by mouth 2 (two) times daily with a meal. 60 tablet 5  . furosemide (LASIX) 20 MG tablet Take 1 tablet (20 mg total) by mouth daily. 30 tablet 5  . lisinopril (PRINIVIL,ZESTRIL) 2.5 MG tablet Take 1 tablet (2.5 mg total) by mouth 2 (two) times daily. 60 tablet 5  . spironolactone (ALDACTONE) 25 MG tablet Take 0.5 tablets (12.5 mg total) by mouth daily. 30 tablet 5  . triamcinolone lotion (KENALOG) 0.1 % Apply sparsely twice daily to rash 120 mL 2   No current facility-administered medications for this encounter.    No Known Allergies    History   Social History  .  Marital Status: Married    Spouse Name: N/A  . Number of Children: N/A  . Years of Education: N/A   Occupational History  . Not on file.   Social History Main Topics  . Smoking status: Never Smoker   . Smokeless tobacco: Never Used  . Alcohol Use: No  . Drug Use: No  . Sexual Activity: No   Other Topics Concern  . Not on file   Social History Narrative   Lives in [place] with husband.  Not currently working.      Family History  Problem Relation Age of Onset  . Hypertension Sister   . Diabetes Maternal Grandmother   . Cancer Mother     died young (pt was only in [employer] @ the time)  . Other Father     died in his 27's.    Filed Vitals:   08/06/14 0856  BP: 106/63  Pulse: 74  Resp: 18  Weight: 101 lb 8 oz (46.04 kg)  SpO2: 99%    PHYSICAL EXAM: General:  Looks older than stated age. Frail.  No respiratory difficulty HEENT: normal Neck: supple. JVP 7. Carotids 2+ bilat; no bruits. No lymphadenopathy or thryomegaly appreciated. Cor: PMI nondisplaced. Regular rate & rhythm. Wide split s2 Lungs: clear Abdomen: soft, nontender, + distended. No hepatosplenomegaly. No bruits or  masses. Good bowel sounds. Extremities: no cyanosis, clubbing, rash, 2+ woody edema Neuro: alert & oriented x 3, cranial nerves grossly intact. moves all 4 extremities w/o difficulty. Affect pleasant.   ASSESSMENT & PLAN: 1. Chronic systolic HF - due to NICM. EF 20% Cath 07/2014. No CAD. Probable HTN cardiomyopathy --Neck veins not up today but has increasing edema and ab bloating. Weight up 7 pounds --Will give one dose of metolazone here and increase lasix to 40 bid. Will provide new scale. Reinforced need to cut back intake and need for daily weights and reviewed use of sliding scale diuretics. --Check labs today --Continue other meds --Insight into CHF is limited. Has HHRN pending 2. PAF - maintaining NSR on amio. --Continue amio and apixaban 3. Pericardial effusion  -s/p tap.  Transudate. Repeat echo in 1 month 4. HTN -controlled  F/u 1 week with BMET.   Calloway Andrus,MD 9:39 AM

## 2014-08-06 NOTE — Patient Instructions (Signed)
Take metolazone when you get home today.  INCREASE Lasix to 40mg  (2 tablets) twice daily.  Follow up 1 week.  Will schedule echocardiogram for 1 month.  Do the following things EVERYDAY: 1) Weigh yourself in the morning before breakfast. Write it down and keep it in a log. 2) Take your medicines as prescribed 3) Eat low salt foods-Limit salt (sodium) to 2000 mg per day.  4) Stay as active as you can everyday 5) Limit all fluids for the day to less than 2 liters

## 2014-08-07 DIAGNOSIS — I5023 Acute on chronic systolic (congestive) heart failure: Secondary | ICD-10-CM | POA: Insufficient documentation

## 2014-08-10 DIAGNOSIS — I309 Acute pericarditis, unspecified: Secondary | ICD-10-CM | POA: Diagnosis not present

## 2014-08-10 DIAGNOSIS — I10 Essential (primary) hypertension: Secondary | ICD-10-CM | POA: Diagnosis not present

## 2014-08-10 DIAGNOSIS — I5023 Acute on chronic systolic (congestive) heart failure: Secondary | ICD-10-CM | POA: Diagnosis not present

## 2014-08-10 DIAGNOSIS — I4891 Unspecified atrial fibrillation: Secondary | ICD-10-CM | POA: Diagnosis not present

## 2014-08-12 DIAGNOSIS — I4891 Unspecified atrial fibrillation: Secondary | ICD-10-CM | POA: Diagnosis not present

## 2014-08-12 DIAGNOSIS — I309 Acute pericarditis, unspecified: Secondary | ICD-10-CM | POA: Diagnosis not present

## 2014-08-12 DIAGNOSIS — I5023 Acute on chronic systolic (congestive) heart failure: Secondary | ICD-10-CM | POA: Diagnosis not present

## 2014-08-12 DIAGNOSIS — I10 Essential (primary) hypertension: Secondary | ICD-10-CM | POA: Diagnosis not present

## 2014-08-13 ENCOUNTER — Other Ambulatory Visit: Payer: Self-pay | Admitting: Thoracic Surgery (Cardiothoracic Vascular Surgery)

## 2014-08-13 ENCOUNTER — Other Ambulatory Visit: Payer: Self-pay | Admitting: Cardiothoracic Surgery

## 2014-08-13 ENCOUNTER — Ambulatory Visit (HOSPITAL_COMMUNITY)
Admission: RE | Admit: 2014-08-13 | Discharge: 2014-08-13 | Disposition: A | Discharge status: Home / Self Care | Payer: Medicare Other | Source: Ambulatory Visit | Attending: Cardiology | Admitting: Cardiology

## 2014-08-13 ENCOUNTER — Encounter (HOSPITAL_COMMUNITY): Payer: Self-pay

## 2014-08-13 ENCOUNTER — Encounter: Payer: Self-pay | Admitting: Cardiology

## 2014-08-13 VITALS — BP 108/64 | HR 64 | Wt 102.2 lb

## 2014-08-13 DIAGNOSIS — I319 Disease of pericardium, unspecified: Secondary | ICD-10-CM

## 2014-08-13 DIAGNOSIS — I429 Cardiomyopathy, unspecified: Secondary | ICD-10-CM | POA: Insufficient documentation

## 2014-08-13 DIAGNOSIS — I3139 Other pericardial effusion (noninflammatory): Secondary | ICD-10-CM

## 2014-08-13 DIAGNOSIS — I480 Paroxysmal atrial fibrillation: Secondary | ICD-10-CM | POA: Diagnosis not present

## 2014-08-13 DIAGNOSIS — I10 Essential (primary) hypertension: Secondary | ICD-10-CM | POA: Diagnosis not present

## 2014-08-13 DIAGNOSIS — I313 Pericardial effusion (noninflammatory): Secondary | ICD-10-CM

## 2014-08-13 DIAGNOSIS — I5022 Chronic systolic (congestive) heart failure: Secondary | ICD-10-CM | POA: Insufficient documentation

## 2014-08-13 DIAGNOSIS — Z7902 Long term (current) use of antithrombotics/antiplatelets: Secondary | ICD-10-CM | POA: Diagnosis not present

## 2014-08-13 DIAGNOSIS — Z79899 Other long term (current) drug therapy: Secondary | ICD-10-CM | POA: Diagnosis not present

## 2014-08-13 LAB — BASIC METABOLIC PANEL
ANION GAP: 6 (ref 5–15)
BUN: 16 mg/dL (ref 6–20)
CHLORIDE: 105 mmol/L (ref 101–111)
CO2: 27 mmol/L (ref 22–32)
CREATININE: 0.81 mg/dL (ref 0.44–1.00)
Calcium: 9.3 mg/dL (ref 8.9–10.3)
GFR calc Af Amer: >60 mL/min (ref >60)
GFR calc non Af Amer: >60 mL/min (ref >60)
Glucose, Bld: 72 mg/dL (ref 65–99)
Potassium: 4.3 mmol/L (ref 3.5–5.1)
Sodium: 138 mmol/L (ref 135–145)

## 2014-08-13 LAB — CBC
HCT: 38.3 % (ref 36.0–46.0)
Hemoglobin: 12.1 g/dL (ref 12.0–15.0)
MCH: 27.1 pg (ref 26.0–34.0)
MCHC: 31.6 g/dL (ref 30.0–36.0)
MCV: 85.7 fL (ref 78.0–100.0)
PLATELETS: 332 10*3/uL (ref 150–400)
RBC: 4.47 MIL/uL (ref 3.87–5.11)
RDW: 14.8 % (ref 11.5–15.5)
WBC: 7 10*3/uL (ref 4.0–10.5)

## 2014-08-13 LAB — TSH: TSH: 2.11 u[IU]/mL (ref 0.350–4.500)

## 2014-08-13 LAB — BRAIN NATRIURETIC PEPTIDE: B NATRIURETIC PEPTIDE 5: 1268.4 pg/mL — AB (ref 0.0–100.0)

## 2014-08-13 LAB — HEPATIC FUNCTION PANEL
ALT: 15 U/L (ref 14–54)
AST: 19 U/L (ref 15–41)
Albumin: 3.3 g/dL — ABNORMAL LOW (ref 3.5–5.0)
Alkaline Phosphatase: 81 U/L (ref 38–126)
Bilirubin, Direct: <0.1 mg/dL — ABNORMAL LOW (ref 0.1–0.5)
Total Bilirubin: 0.7 mg/dL (ref 0.3–1.2)
Total Protein: 7.8 g/dL (ref 6.5–8.1)

## 2014-08-13 MED ORDER — CARVEDILOL 6.25 MG PO TABS: 6.2500 mg | ORAL_TABLET | Freq: Two times a day (BID) | ORAL | Status: DC

## 2014-08-13 NOTE — Patient Instructions (Addendum)
Lab work today. We will call you with any abnormal/concerning results.  Otherwise, no news is good news!  INCREASE Carvedilol (Coreg) to 6.25mg  twice daily.  Will refer you to Cardiac Rehab. They will call you with your initial appointment.  Follow up 3 weeks.  Do the following things EVERYDAY: 1) Weigh yourself in the morning before breakfast. Write it down and keep it in a log. 2) Take your medicines as prescribed 3) Eat low salt foods-Limit salt (sodium) to 2000 mg per day.  4) Stay as active as you can everyday 5) Limit all fluids for the day to less than 2 liters

## 2014-08-15 NOTE — Progress Notes (Signed)
Patient ID: Katrina Peck, female   DOB: 1945/12/08, 69 y.o.   MRN: [id number]   ADVANCED HF CLINIC NOTE  69 y/o female with history of HTN who was admitted 4/26 with 3 months of exertional dyspnea, weight gain, cough, and orthopnea. Found to have EF 20% and a large pericardial effusion. Cath 5/16 no CAD. Hospital course complicated by atrial fibrillation w/ RVR, converted to NSR on amiodarone. Underwent pericardial window (transudate).    Since last appointment, patient has bene stable.  She is not short of breath walking on flat ground but says she sometimes gets short of breath talking.  No orthopnea/PND.  No chest pain.  Rare brief palpitations.  No lightheadedness.  She has been walking around in her house for exercise.  Weight is stable.   ECG: NSR, IVCD 136 msec  Labs (5/16): K 4.6, creatinine 1.0, TSH normal  SH: Married, no smoking, no ETOH.   FH: No history of cardiomyopathy or sudden death.    ROS: All systems reviewed and negative except as per HPI.   PMH: 1. Chronic systolic CHF: Nonischemic cardiomyopathy.  LHC/RHC (4/16) with no significant coronary disease; mean RA 5, PA 28/11, mean PCWP 9, CI 3.39.  Echo (4/16): EF 20% with diffuse hypokinesis, moderate RV systolic dysfunction, large pericardial effusion.  No history of drug or ETOH abuse.  No family history of cardiomyopathy.  2. Pericardial effusion: Large on 4/16 echo.  Patient had pericardial window for diagnostic and therapeutic purposes.  Cytology negative for malignancy.  3. Atrial fibrillation: Atrial fibrillation with RVR, convereted to NSR with amiodarone.   4. HTN  Current Outpatient Prescriptions  Medication Sig Dispense Refill  . amiodarone (PACERONE) 100 MG tablet Take 2 tablets (200 mg total) by mouth daily. 30 tablet 5  . apixaban (ELIQUIS) 5 MG TABS tablet Take 1 tablet (5 mg total) by mouth 2 (two) times daily. 60 tablet 5  . carvedilol (COREG) 6.25 MG tablet Take 1 tablet (6.25 mg total) by mouth 2  (two) times daily with a meal. 60 tablet 3  . furosemide (LASIX) 20 MG tablet Take 2 tablets (40 mg total) by mouth 2 (two) times daily. 60 tablet 5  . lisinopril (PRINIVIL,ZESTRIL) 2.5 MG tablet Take 1 tablet (2.5 mg total) by mouth 2 (two) times daily. 60 tablet 5  . spironolactone (ALDACTONE) 25 MG tablet Take 0.5 tablets (12.5 mg total) by mouth daily. 30 tablet 5  . triamcinolone lotion (KENALOG) 0.1 % Apply sparsely twice daily to rash 120 mL 2   No current facility-administered medications for this encounter.    No Known Allergies    Filed Vitals:   08/13/14 1021  BP: 108/64  Pulse: 64  Weight: 102 lb 4 oz (46.38 kg)  SpO2: 97%    PHYSICAL EXAM: General:  Looks older than stated age. Frail.  No respiratory difficulty HEENT: normal Neck: supple. JVP 7. Carotids 2+ bilat; no bruits. No lymphadenopathy or thryomegaly appreciated. Cor: PMI nondisplaced. Regular rate & rhythm. Paradoxical S2 split Lungs: clear Abdomen: soft, nontender, + distended. No hepatosplenomegaly. No bruits or masses. Good bowel sounds. Extremities: no cyanosis, clubbing, rash, 1+ ankle edema Neuro: alert & oriented x 3, cranial nerves grossly intact. moves all 4 extremities w/o difficulty. Affect pleasant.   ASSESSMENT & PLAN: 1. Chronic systolic HF: Due to nonischemic cardiomyopathy.  Echo (4/16) with EF 20% and moderate RV systolic dysfunction. Cath 07/2014 with no CAD. 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  No family history of cardiomyopathy, no  ETOH or drug abuse.  On exam, she does not look volume overloaded.  NYHA class II-III symptoms.  - I would like to see her start cardiac rehab.  - Continue Lasix 40 mg bid.  BMET/BNP today.  - Increase Coreg to 6.25 mg bid.   - Continue current lisinopril and spironolactone.  - Will arrange for cardiac MRI soon to assess for infiltrative disease.  - If EF remains low after 6 months or so of medical treatment, will need ICD => probably  will be CRT candidate (LBBB-like IVCD).   2. PAF:  Maintaining NSR on amiodarone. - Continue amio and apixaban.  Will need to check LFTs and TSH as well as CBC.  She was told to get regular eye exams while on amiodarone.   3. Pericardial effusion: s/p tap. Transudate, cytology negative.   4. HTN: Controlled  Mildred Tuccillo,MD 08/15/2014

## 2014-08-16 ENCOUNTER — Ambulatory Visit (INDEPENDENT_AMBULATORY_CARE_PROVIDER_SITE_OTHER): Payer: Self-pay | Admitting: Physician Assistant

## 2014-08-16 ENCOUNTER — Ambulatory Visit
Admission: RE | Admit: 2014-08-16 | Discharge: 2014-08-16 | Disposition: A | Discharge status: Home / Self Care | Payer: Medicare Other | Source: Ambulatory Visit | Attending: Cardiothoracic Surgery | Admitting: Cardiothoracic Surgery

## 2014-08-16 VITALS — BP 136/86 | HR 62 | Resp 16 | Ht 62.0 in | Wt 102.0 lb

## 2014-08-16 DIAGNOSIS — J90 Pleural effusion, not elsewhere classified: Secondary | ICD-10-CM | POA: Diagnosis not present

## 2014-08-16 DIAGNOSIS — I313 Pericardial effusion (noninflammatory): Secondary | ICD-10-CM

## 2014-08-16 DIAGNOSIS — I319 Disease of pericardium, unspecified: Secondary | ICD-10-CM

## 2014-08-16 DIAGNOSIS — I3139 Other pericardial effusion (noninflammatory): Secondary | ICD-10-CM

## 2014-08-16 NOTE — Progress Notes (Signed)
  HPI:  Patient returns for routine postoperative follow-up having undergone a subxiphoid pericardial window by Dr. Tyrone Sage on 07/26/2014. Cultures showed no growth and pathology were negative for cancer. The patient's early postoperative recovery while in the hospital was notable for a fib with RVR. Since hospital discharge the patient reports she has occasional shortness of breath with exertion. She denies orthopnea, PND, and increasing lower extremity edema. She does note that her left thigh and knee sometimes feel numb and she sometimes has a tingling down her left leg.   Current Outpatient Prescriptions  Medication Sig Dispense Refill  . amiodarone (PACERONE) 100 MG tablet Take 2 tablets (200 mg total) by mouth daily. 30 tablet 5  . apixaban (ELIQUIS) 5 MG TABS tablet Take 1 tablet (5 mg total) by mouth 2 (two) times daily. 60 tablet 5  . carvedilol (COREG) 6.25 MG tablet Take 1 tablet (6.25 mg total) by mouth 2 (two) times daily with a meal. 60 tablet 3  . furosemide (LASIX) 20 MG tablet Take 2 tablets (40 mg total) by mouth 2 (two) times daily. 60 tablet 5  . lisinopril (PRINIVIL,ZESTRIL) 2.5 MG tablet Take 1 tablet (2.5 mg total) by mouth 2 (two) times daily. 60 tablet 5  . spironolactone (ALDACTONE) 25 MG tablet Take 0.5 tablets (12.5 mg total) by mouth daily. 30 tablet 5  . triamcinolone lotion (KENALOG) 0.1 % Apply sparsely twice daily to rash 120 mL 2   No current facility-administered medications for this visit.  Vital Signs: BP 136/86, HR 62, RR 16  Physical Exam: CV-RRR Pulmonary-Slightly diminished at bases Extremities-Trace LE edema Wound-Clean and dry. Eschar at chest tube suture site.  Diagnostic Tests: PA/LAT CXR: No pneumothorax, small bilateral pleural effusions, and cardiomegaly.  Impression and Plan: She has an appointment to see her medical doctor tomorrow and a follow up with Dr. Shirlee Latch in about 3 weeks. Regarding her numbness and tingling of left lower  extremity, I instructed her to discuss with medical doctor. Etiology to be determined. Her left leg/calf was not warm ort ender and Homans was negative. I doubt DVT. I will discuss with Dr. Tyrone Sage if she needs any further surgical follow up.  Ardelle Balls, PA-C [facility] [phone]

## 2014-08-17 DIAGNOSIS — I10 Essential (primary) hypertension: Secondary | ICD-10-CM | POA: Diagnosis not present

## 2014-08-17 DIAGNOSIS — I517 Cardiomegaly: Secondary | ICD-10-CM | POA: Diagnosis not present

## 2014-08-17 DIAGNOSIS — I4891 Unspecified atrial fibrillation: Secondary | ICD-10-CM | POA: Diagnosis not present

## 2014-08-17 DIAGNOSIS — I5023 Acute on chronic systolic (congestive) heart failure: Secondary | ICD-10-CM | POA: Diagnosis not present

## 2014-08-17 DIAGNOSIS — I309 Acute pericarditis, unspecified: Secondary | ICD-10-CM | POA: Diagnosis not present

## 2014-08-17 DIAGNOSIS — I8393 Asymptomatic varicose veins of bilateral lower extremities: Secondary | ICD-10-CM | POA: Diagnosis not present

## 2014-08-19 DIAGNOSIS — I309 Acute pericarditis, unspecified: Secondary | ICD-10-CM | POA: Diagnosis not present

## 2014-08-19 DIAGNOSIS — I5023 Acute on chronic systolic (congestive) heart failure: Secondary | ICD-10-CM | POA: Diagnosis not present

## 2014-08-19 DIAGNOSIS — I10 Essential (primary) hypertension: Secondary | ICD-10-CM | POA: Diagnosis not present

## 2014-08-19 DIAGNOSIS — I4891 Unspecified atrial fibrillation: Secondary | ICD-10-CM | POA: Diagnosis not present

## 2014-08-20 LAB — FUNGUS CULTURE W SMEAR: Fungal Smear: NONE SEEN

## 2014-08-24 DIAGNOSIS — I10 Essential (primary) hypertension: Secondary | ICD-10-CM | POA: Diagnosis not present

## 2014-08-24 DIAGNOSIS — I309 Acute pericarditis, unspecified: Secondary | ICD-10-CM | POA: Diagnosis not present

## 2014-08-24 DIAGNOSIS — I5023 Acute on chronic systolic (congestive) heart failure: Secondary | ICD-10-CM | POA: Diagnosis not present

## 2014-08-24 DIAGNOSIS — I4891 Unspecified atrial fibrillation: Secondary | ICD-10-CM | POA: Diagnosis not present

## 2014-08-26 DIAGNOSIS — I5023 Acute on chronic systolic (congestive) heart failure: Secondary | ICD-10-CM | POA: Diagnosis not present

## 2014-08-26 DIAGNOSIS — I4891 Unspecified atrial fibrillation: Secondary | ICD-10-CM | POA: Diagnosis not present

## 2014-08-26 DIAGNOSIS — I10 Essential (primary) hypertension: Secondary | ICD-10-CM | POA: Diagnosis not present

## 2014-08-26 DIAGNOSIS — I309 Acute pericarditis, unspecified: Secondary | ICD-10-CM | POA: Diagnosis not present

## 2014-08-31 DIAGNOSIS — I5023 Acute on chronic systolic (congestive) heart failure: Secondary | ICD-10-CM | POA: Diagnosis not present

## 2014-08-31 DIAGNOSIS — I4891 Unspecified atrial fibrillation: Secondary | ICD-10-CM | POA: Diagnosis not present

## 2014-08-31 DIAGNOSIS — I10 Essential (primary) hypertension: Secondary | ICD-10-CM | POA: Diagnosis not present

## 2014-08-31 DIAGNOSIS — I309 Acute pericarditis, unspecified: Secondary | ICD-10-CM | POA: Diagnosis not present

## 2014-09-02 ENCOUNTER — Telehealth (HOSPITAL_COMMUNITY): Payer: Self-pay | Admitting: *Deleted

## 2014-09-03 ENCOUNTER — Telehealth (HOSPITAL_COMMUNITY): Payer: Self-pay | Admitting: *Deleted

## 2014-09-03 ENCOUNTER — Encounter (HOSPITAL_COMMUNITY): Payer: Medicare Other

## 2014-09-03 NOTE — Telephone Encounter (Signed)
pts husband called and stated pt would be 30 or more minutes late for her appt. I told them she would need to reschedule next appt is not until the end of June. pts husband said he would call back Monday to see if we had any cancellations if not he would schedule her for the next available.  Pt was also on the phone both verbally stated they understood todays appt would be cancelled and they would need to call to reschedule.

## 2014-09-06 ENCOUNTER — Ambulatory Visit (HOSPITAL_COMMUNITY)
Admission: RE | Admit: 2014-09-06 | Discharge: 2014-09-06 | Disposition: A | Discharge status: Home / Self Care | Payer: Medicare Other | Source: Ambulatory Visit | Attending: Internal Medicine | Admitting: Internal Medicine

## 2014-09-06 DIAGNOSIS — I5021 Acute systolic (congestive) heart failure: Secondary | ICD-10-CM | POA: Diagnosis not present

## 2014-09-06 DIAGNOSIS — I313 Pericardial effusion (noninflammatory): Secondary | ICD-10-CM | POA: Diagnosis not present

## 2014-09-06 DIAGNOSIS — I509 Heart failure, unspecified: Secondary | ICD-10-CM | POA: Diagnosis present

## 2014-09-06 NOTE — Progress Notes (Signed)
*  PRELIMINARY RESULTS*.  [name] 09/06/2014, 10:59 AM

## 2014-09-07 DIAGNOSIS — I309 Acute pericarditis, unspecified: Secondary | ICD-10-CM | POA: Diagnosis not present

## 2014-09-07 DIAGNOSIS — I10 Essential (primary) hypertension: Secondary | ICD-10-CM | POA: Diagnosis not present

## 2014-09-07 DIAGNOSIS — I4891 Unspecified atrial fibrillation: Secondary | ICD-10-CM | POA: Diagnosis not present

## 2014-09-07 DIAGNOSIS — I5023 Acute on chronic systolic (congestive) heart failure: Secondary | ICD-10-CM | POA: Diagnosis not present

## 2014-09-07 LAB — AFB CULTURE WITH SMEAR (NOT AT ARMC): Acid Fast Smear: NONE SEEN

## 2014-09-15 DIAGNOSIS — I5023 Acute on chronic systolic (congestive) heart failure: Secondary | ICD-10-CM | POA: Diagnosis not present

## 2014-09-15 DIAGNOSIS — I10 Essential (primary) hypertension: Secondary | ICD-10-CM | POA: Diagnosis not present

## 2014-09-15 DIAGNOSIS — I309 Acute pericarditis, unspecified: Secondary | ICD-10-CM | POA: Diagnosis not present

## 2014-09-15 DIAGNOSIS — I4891 Unspecified atrial fibrillation: Secondary | ICD-10-CM | POA: Diagnosis not present

## 2014-09-23 DIAGNOSIS — I4891 Unspecified atrial fibrillation: Secondary | ICD-10-CM | POA: Diagnosis not present

## 2014-09-23 DIAGNOSIS — I309 Acute pericarditis, unspecified: Secondary | ICD-10-CM | POA: Diagnosis not present

## 2014-09-23 DIAGNOSIS — I10 Essential (primary) hypertension: Secondary | ICD-10-CM | POA: Diagnosis not present

## 2014-09-23 DIAGNOSIS — I5023 Acute on chronic systolic (congestive) heart failure: Secondary | ICD-10-CM | POA: Diagnosis not present

## 2014-09-29 ENCOUNTER — Telehealth (HOSPITAL_COMMUNITY): Payer: Self-pay | Admitting: Cardiology

## 2014-09-29 ENCOUNTER — Ambulatory Visit (HOSPITAL_COMMUNITY)
Admission: RE | Admit: 2014-09-29 | Discharge: 2014-09-29 | Disposition: A | Discharge status: Home / Self Care | Payer: Medicare Other | Source: Ambulatory Visit | Attending: Cardiology | Admitting: Cardiology

## 2014-09-29 VITALS — BP 144/90 | HR 87 | Wt 105.2 lb

## 2014-09-29 DIAGNOSIS — I480 Paroxysmal atrial fibrillation: Secondary | ICD-10-CM | POA: Insufficient documentation

## 2014-09-29 DIAGNOSIS — I429 Cardiomyopathy, unspecified: Secondary | ICD-10-CM | POA: Diagnosis not present

## 2014-09-29 DIAGNOSIS — I3139 Other pericardial effusion (noninflammatory): Secondary | ICD-10-CM

## 2014-09-29 DIAGNOSIS — Z7902 Long term (current) use of antithrombotics/antiplatelets: Secondary | ICD-10-CM | POA: Diagnosis not present

## 2014-09-29 DIAGNOSIS — I447 Left bundle-branch block, unspecified: Secondary | ICD-10-CM | POA: Diagnosis not present

## 2014-09-29 DIAGNOSIS — I319 Disease of pericardium, unspecified: Secondary | ICD-10-CM

## 2014-09-29 DIAGNOSIS — I10 Essential (primary) hypertension: Secondary | ICD-10-CM | POA: Insufficient documentation

## 2014-09-29 DIAGNOSIS — I313 Pericardial effusion (noninflammatory): Secondary | ICD-10-CM | POA: Insufficient documentation

## 2014-09-29 DIAGNOSIS — Z79899 Other long term (current) drug therapy: Secondary | ICD-10-CM | POA: Insufficient documentation

## 2014-09-29 DIAGNOSIS — I5022 Chronic systolic (congestive) heart failure: Secondary | ICD-10-CM | POA: Insufficient documentation

## 2014-09-29 LAB — CBC
HCT: 39.7 % (ref 36.0–46.0)
Hemoglobin: 12.7 g/dL (ref 12.0–15.0)
MCH: 27.5 pg (ref 26.0–34.0)
MCHC: 32 g/dL (ref 30.0–36.0)
MCV: 85.9 fL (ref 78.0–100.0)
Platelets: 284 10*3/uL (ref 150–400)
RBC: 4.62 MIL/uL (ref 3.87–5.11)
RDW: 16.6 % — AB (ref 11.5–15.5)
WBC: 7.5 10*3/uL (ref 4.0–10.5)

## 2014-09-29 LAB — BASIC METABOLIC PANEL
ANION GAP: 8 (ref 5–15)
BUN: 22 mg/dL — ABNORMAL HIGH (ref 6–20)
CHLORIDE: 109 mmol/L (ref 101–111)
CO2: 22 mmol/L (ref 22–32)
Calcium: 9.1 mg/dL (ref 8.9–10.3)
Creatinine, Ser: 1 mg/dL (ref 0.44–1.00)
GFR calc Af Amer: >60 mL/min (ref >60)
GFR calc non Af Amer: 57 mL/min — ABNORMAL LOW (ref >60)
Glucose, Bld: 103 mg/dL — ABNORMAL HIGH (ref 65–99)
POTASSIUM: 5.3 mmol/L — AB (ref 3.5–5.1)
Sodium: 139 mmol/L (ref 135–145)

## 2014-09-29 LAB — BRAIN NATRIURETIC PEPTIDE: B Natriuretic Peptide: >4500 pg/mL — ABNORMAL HIGH (ref 0.0–100.0)

## 2014-09-29 MED ORDER — SPIRONOLACTONE 25 MG PO TABS: 12.5000 mg | ORAL_TABLET | Freq: Every day | ORAL | Status: DC

## 2014-09-29 MED ORDER — SPIRONOLACTONE 25 MG PO TABS: 25.0000 mg | ORAL_TABLET | Freq: Every day | ORAL | Status: DC

## 2014-09-29 MED ORDER — FUROSEMIDE 40 MG PO TABS: ORAL_TABLET | ORAL | Status: DC

## 2014-09-29 MED ORDER — POTASSIUM CHLORIDE CRYS ER 20 MEQ PO TBCR: 20.0000 meq | EXTENDED_RELEASE_TABLET | Freq: Every day | ORAL | Status: DC

## 2014-09-29 NOTE — Patient Instructions (Signed)
Increase Furosemide to 80 mg in AM and 40 mg in PM, we have sent you in a new prescription for 40 mg tablets  Increase Spironolactone to 25 mg (1 tab) daily  Start Potassium 20 meq daily  Labs today  Your physician has requested that you have an echocardiogram. Echocardiography is a painless test that uses sound waves to create images of your heart. It provides your doctor with information about the size and shape of your heart and how well your heart's chambers and valves are working. This procedure takes approximately one hour. There are no restrictions for this procedure.  Your physician recommends that you schedule a follow-up appointment in: 1 week with labs

## 2014-09-29 NOTE — Telephone Encounter (Signed)
Pt aware and voiced understanding 

## 2014-09-29 NOTE — Telephone Encounter (Signed)
-----   Message from Laurey Morale, MD sent at 09/29/2014  4:52 PM EDT ----- BNP very high.  Increased Lasix today to 80 qam/40 qpm. She should NOT add KCl 20 with increased Lasix and she should NOT increase her spironolactone (leave at 12.5 daily).  Please call ASAP.

## 2014-09-29 NOTE — Progress Notes (Signed)
Patient ID: Katrina Peck, female   DOB: 1945-04-07, 69 y.o.   MRN: [id number]   ADVANCED HF CLINIC NOTE  69 y/o female with history of HTN who was admitted 07/20/14 with 3 months of exertional dyspnea, weight gain, cough, and orthopnea. Found to have EF 20% and a large pericardial effusion. Cath 5/16 no CAD. Hospital course complicated by atrial fibrillation w/ RVR, converted to NSR on amiodarone. Underwent pericardial window (transudate).    She had a repeat echo in 6/16 that showed persistently low LV EF at 15-20% with recurrence of moderate to large pericardial effusion without tamponade. She has had more dyspnea and fatigue x 2-3 weeks.  She is short of breath walking around her house at times.  Most of the time she can walk about 100 feet before getting short of breath.  She has profound fatigue.  +Orthopnea.  No chest pain, rare lightheadedness (no falls or syncope).  Weight is up 3 lbs on our scales.    ECG: NSR, IVCD 136 msec  Labs (5/16): K 4.6, creatinine 1.0 => 0.81, TSH normal, LFTs normal, TSH normal, BNP 1268  SH: Married, no smoking, no ETOH.   FH: No history of cardiomyopathy or sudden death.    ROS: All systems reviewed and negative except as per HPI.   PMH: 1. Chronic systolic CHF: Nonischemic cardiomyopathy.  LHC/RHC (4/16) with no significant coronary disease; mean RA 5, PA 28/11, mean PCWP 9, CI 3.39.  Echo (4/16): EF 20% with diffuse hypokinesis, moderate RV systolic dysfunction, large pericardial effusion.  No history of drug or ETOH abuse.  No family history of cardiomyopathy.   Echo (6/16) with EF 15-20%, restrictive diastolic function, moderate MR, moderately decreased RV systolic function, PA systolic pressure 49 mmHg, moderate-large pericardial effusion without tamponade.  2. Pericardial effusion: Large on 4/16 echo.  Patient had pericardial window for diagnostic and therapeutic purposes.  Cytology negative for malignancy.  6/16 echo with recurrence of moderate-large  pericardial effusion without tamponade.  3. Atrial fibrillation: Atrial fibrillation with RVR, convereted to NSR with amiodarone.   4. HTN  Current Outpatient Prescriptions  Medication Sig Dispense Refill  . amiodarone (PACERONE) 100 MG tablet Take 2 tablets (200 mg total) by mouth daily. 30 tablet 5  . apixaban (ELIQUIS) 5 MG TABS tablet Take 1 tablet (5 mg total) by mouth 2 (two) times daily. 60 tablet 5  . carvedilol (COREG) 6.25 MG tablet Take 1 tablet (6.25 mg total) by mouth 2 (two) times daily with a meal. 60 tablet 3  . furosemide (LASIX) 40 MG tablet Take 2 tabs in AM and 1 tab in POM 90 tablet 3  . lisinopril (PRINIVIL,ZESTRIL) 2.5 MG tablet Take 1 tablet (2.5 mg total) by mouth 2 (two) times daily. 60 tablet 5  . triamcinolone lotion (KENALOG) 0.1 % Apply sparsely twice daily to rash 120 mL 2  . spironolactone (ALDACTONE) 25 MG tablet Take 0.5 tablets (12.5 mg total) by mouth daily. 30 tablet 5   No current facility-administered medications for this encounter.    No Known Allergies    Filed Vitals:   09/29/14 1500  BP: 144/90  Pulse: 87  Weight: 105 lb 4 oz (47.741 kg)  SpO2: 99%    PHYSICAL EXAM: General: Frail.  No respiratory difficulty HEENT: normal Neck: supple. JVP 12 cm. Carotids 2+ bilat; no bruits. No lymphadenopathy or thryomegaly appreciated. Cor: PMI nondisplaced. Regular rate & rhythm. Paradoxical S2 split Lungs: Slightly decreased breath sounds bilaterally.   Abdomen: soft,  nontender, + distended. No hepatosplenomegaly. No bruits or masses. Good bowel sounds. Extremities: no cyanosis, clubbing, rash, 1+ edema 1/2 up lower legs bilaterally.  Neuro: alert & oriented x 3, cranial nerves grossly intact. moves all 4 extremities w/o difficulty. Affect pleasant.   ASSESSMENT & PLAN: 1. Chronic systolic HF: Due to nonischemic cardiomyopathy.  Echo (4/16) with EF 20% and moderate RV systolic dysfunction. Cath 07/2014 with no CAD.  Echo in 6/16 showed persistent  EF 15-20% with moderate RV dysfunction as well as recurrence of moderate to large pericardial effusion.  Etiology of cardiomyopathy uncertain.  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  No family history of cardiomyopathy, no ETOH or drug abuse.  She is volume overloaded on exam with NYHA class IIIb symptoms. - Increase Lasix to 80 qam, 40 qpm.  BMET today.  May need to replete K.  If K is stable, will increase spironolactone to 25 mg daily.  Will need repeat BMET in 2 wks.  - Continue Coreg 6.25 mg bid and lisinopril 2.5 mg bid. - Will need cardiac MRI at some point to assess for infiltrative disease.  - If EF remains low after 6 months or so of medical treatment, will need ICD => probably will be CRT candidate (LBBB-like IVCD).  Echo (or cMRI) for CRT-D would be in 10/16.   2. PAF:  Maintaining NSR on amiodarone. - Continue amio and apixaban.  LFTs/TSH normal in 5/16.  She was told to get regular eye exams while on amiodarone.   3. Pericardial effusion: s/p pericardial window. Transudate, cytology negative.  Unfortunately, last echo in 6/16 showed recurrence of moderate to severe pericardial effusion without tamponade.  BP is preserved.  I will arrange for repeat echo tomorrow or the next day to follow the effusion, ensure still not tamponade.  Followup in 2 wks.    Laryn Venning,MD 09/29/2014

## 2014-09-30 ENCOUNTER — Ambulatory Visit (HOSPITAL_COMMUNITY)
Admission: RE | Admit: 2014-09-30 | Discharge: 2014-09-30 | Disposition: A | Discharge status: Home / Self Care | Payer: Medicare Other | Source: Ambulatory Visit | Attending: Internal Medicine | Admitting: Internal Medicine

## 2014-09-30 DIAGNOSIS — I313 Pericardial effusion (noninflammatory): Secondary | ICD-10-CM | POA: Insufficient documentation

## 2014-09-30 DIAGNOSIS — I5021 Acute systolic (congestive) heart failure: Secondary | ICD-10-CM | POA: Diagnosis not present

## 2014-09-30 DIAGNOSIS — I340 Nonrheumatic mitral (valve) insufficiency: Secondary | ICD-10-CM | POA: Diagnosis not present

## 2014-09-30 DIAGNOSIS — I071 Rheumatic tricuspid insufficiency: Secondary | ICD-10-CM | POA: Diagnosis not present

## 2014-09-30 NOTE — Progress Notes (Signed)
*  PRELIMINARY RESULTS* Echocardiogram TTE has been performed.  Katrina Peck 09/30/2014, 3:12 PM

## 2014-10-01 ENCOUNTER — Telehealth (HOSPITAL_COMMUNITY): Payer: Self-pay | Admitting: Cardiology

## 2014-10-01 DIAGNOSIS — I309 Acute pericarditis, unspecified: Secondary | ICD-10-CM | POA: Diagnosis not present

## 2014-10-01 DIAGNOSIS — I5023 Acute on chronic systolic (congestive) heart failure: Secondary | ICD-10-CM | POA: Diagnosis not present

## 2014-10-01 DIAGNOSIS — I4891 Unspecified atrial fibrillation: Secondary | ICD-10-CM | POA: Diagnosis not present

## 2014-10-01 DIAGNOSIS — I10 Essential (primary) hypertension: Secondary | ICD-10-CM | POA: Diagnosis not present

## 2014-10-01 NOTE — Telephone Encounter (Signed)
I called Katrina Peck with the results of her echo.  I am concerned about progressive CHF and thought she should come to the hospital to be admitted.  She did not want to come to the hospital, wants to try to manage from home.  I asked her to increase Lasix to 80 mg bid x 2 days then back to 80 qam/40 qpm.  She will need close followup, will make sure she comes in early next week.

## 2014-10-02 DIAGNOSIS — I309 Acute pericarditis, unspecified: Secondary | ICD-10-CM | POA: Diagnosis not present

## 2014-10-02 DIAGNOSIS — I10 Essential (primary) hypertension: Secondary | ICD-10-CM | POA: Diagnosis not present

## 2014-10-02 DIAGNOSIS — I4891 Unspecified atrial fibrillation: Secondary | ICD-10-CM | POA: Diagnosis not present

## 2014-10-02 DIAGNOSIS — I5023 Acute on chronic systolic (congestive) heart failure: Secondary | ICD-10-CM | POA: Diagnosis not present

## 2014-10-05 DIAGNOSIS — R609 Edema, unspecified: Secondary | ICD-10-CM | POA: Diagnosis not present

## 2014-10-05 DIAGNOSIS — I509 Heart failure, unspecified: Secondary | ICD-10-CM | POA: Diagnosis not present

## 2014-10-05 DIAGNOSIS — I10 Essential (primary) hypertension: Secondary | ICD-10-CM | POA: Diagnosis not present

## 2014-10-05 DIAGNOSIS — I4891 Unspecified atrial fibrillation: Secondary | ICD-10-CM | POA: Diagnosis not present

## 2014-10-06 ENCOUNTER — Ambulatory Visit (HOSPITAL_COMMUNITY)
Admission: RE | Admit: 2014-10-06 | Discharge: 2014-10-06 | Disposition: A | Discharge status: Home / Self Care | Payer: Medicare Other | Source: Ambulatory Visit | Attending: Cardiology | Admitting: Cardiology

## 2014-10-06 VITALS — BP 90/48 | HR 72 | Wt 98.0 lb

## 2014-10-06 DIAGNOSIS — Z7902 Long term (current) use of antithrombotics/antiplatelets: Secondary | ICD-10-CM | POA: Diagnosis not present

## 2014-10-06 DIAGNOSIS — I480 Paroxysmal atrial fibrillation: Secondary | ICD-10-CM | POA: Diagnosis not present

## 2014-10-06 DIAGNOSIS — I429 Cardiomyopathy, unspecified: Secondary | ICD-10-CM | POA: Diagnosis not present

## 2014-10-06 DIAGNOSIS — I3139 Other pericardial effusion (noninflammatory): Secondary | ICD-10-CM

## 2014-10-06 DIAGNOSIS — Z79899 Other long term (current) drug therapy: Secondary | ICD-10-CM | POA: Diagnosis not present

## 2014-10-06 DIAGNOSIS — I10 Essential (primary) hypertension: Secondary | ICD-10-CM | POA: Insufficient documentation

## 2014-10-06 DIAGNOSIS — I313 Pericardial effusion (noninflammatory): Secondary | ICD-10-CM

## 2014-10-06 DIAGNOSIS — I5022 Chronic systolic (congestive) heart failure: Secondary | ICD-10-CM | POA: Diagnosis not present

## 2014-10-06 DIAGNOSIS — I319 Disease of pericardium, unspecified: Secondary | ICD-10-CM | POA: Diagnosis not present

## 2014-10-06 LAB — BASIC METABOLIC PANEL
ANION GAP: 7 (ref 5–15)
BUN: 15 mg/dL (ref 6–20)
CALCIUM: 9.3 mg/dL (ref 8.9–10.3)
CO2: 27 mmol/L (ref 22–32)
CREATININE: 1.02 mg/dL — AB (ref 0.44–1.00)
Chloride: 103 mmol/L (ref 101–111)
GFR calc Af Amer: >60 mL/min (ref >60)
GFR, EST NON AFRICAN AMERICAN: 55 mL/min — AB (ref >60)
Glucose, Bld: 104 mg/dL — ABNORMAL HIGH (ref 65–99)
Potassium: 5.4 mmol/L — ABNORMAL HIGH (ref 3.5–5.1)
SODIUM: 137 mmol/L (ref 135–145)

## 2014-10-06 LAB — BRAIN NATRIURETIC PEPTIDE: B Natriuretic Peptide: 1081.9 pg/mL — ABNORMAL HIGH (ref 0.0–100.0)

## 2014-10-06 NOTE — Patient Instructions (Signed)
Labs today  Your physician has requested that you have a cardiac MRI. Cardiac MRI uses a computer to create images of your heart as its beating, producing both still and moving pictures of your heart and major blood vessels. For further information please visit [email or web address]. Please follow the instruction sheet given to you today for more information.  ONCE WE GET APPROVED FROM YOUR INSURANCE COMPANY WE WILL SCHEDULE.  Your physician recommends that you schedule a follow-up appointment in: 2 weeks

## 2014-10-07 DIAGNOSIS — I309 Acute pericarditis, unspecified: Secondary | ICD-10-CM | POA: Diagnosis not present

## 2014-10-07 DIAGNOSIS — I5023 Acute on chronic systolic (congestive) heart failure: Secondary | ICD-10-CM | POA: Diagnosis not present

## 2014-10-07 DIAGNOSIS — I4891 Unspecified atrial fibrillation: Secondary | ICD-10-CM | POA: Diagnosis not present

## 2014-10-07 DIAGNOSIS — I10 Essential (primary) hypertension: Secondary | ICD-10-CM | POA: Diagnosis not present

## 2014-10-07 LAB — PROTEIN ELECTROPHORESIS, SERUM
A/G RATIO SPE: 0.8 (ref 0.7–1.7)
ALBUMIN ELP: 3.3 g/dL (ref 2.9–4.4)
ALPHA-1-GLOBULIN: 0.2 g/dL (ref 0.0–0.4)
Alpha-2-Globulin: 0.6 g/dL (ref 0.4–1.0)
Beta Globulin: 1.3 g/dL (ref 0.7–1.3)
GAMMA GLOBULIN: 1.7 g/dL (ref 0.4–1.8)
GLOBULIN, TOTAL: 3.9 g/dL (ref 2.2–3.9)
TOTAL PROTEIN ELP: 7.2 g/dL (ref 6.0–8.5)

## 2014-10-07 NOTE — Addendum Note (Signed)
Encounter addended by: Laurey Morale, MD on: 10/07/2014 12:33 AM<BR>     Documentation filed: Notes Section

## 2014-10-07 NOTE — Progress Notes (Addendum)
Patient ID: Katrina Peck, female   DOB: [birth year], 69 y.o.   MRN: [id number]   ADVANCED HF CLINIC NOTE  PCP: Dr. Duanne Guess  69 y/o female with history of HTN who was admitted 07/20/14 with 3 months of exertional dyspnea, weight gain, cough, and orthopnea. Found to have EF 20% and a large pericardial effusion. Cath 5/16 no CAD. Hospital course complicated by atrial fibrillation w/ RVR, converted to NSR on amiodarone. Underwent pericardial window (transudate).    She had a repeat echo in 6/16 that showed persistently low LV EF at 15-20% with recurrence of moderate to large pericardial effusion without tamponade. At last appointment, she was short of breath walking around her house.  She had profound fatigue.  +Orthopnea.  I had a repeat echo done (7/16) to followup on pericardial effusion.  This showed EF 10-15%, mild LV dilation, restrictive diastolic function, moderate to severely decreased RV function, and a moderate to large pericardial effusion similar to 6/16.  She did not want to be admitted.   I increased her Lasix to 80 qam, 40 qpm.  Weight is now down 7 lbs.  She is breathing much better.  No dyspnea walking on flat ground.  Very mild orthopnea.  No chest pain, no lightheadedness or syncope.    ECG: NSR, IVCD 136 msec  Labs (5/16): K 4.6, creatinine 1.0 => 0.81, TSH normal, LFTs normal, TSH normal, BNP 1268 Labs (7/16): K 5.3, creatinine 1.0, HCT 39.7, BNP > 4500  SH: Married, no smoking, no ETOH.   FH: No history of cardiomyopathy or sudden death.    ROS: All systems reviewed and negative except as per HPI.   PMH: 1. Chronic systolic CHF: Nonischemic cardiomyopathy.  LHC/RHC (4/16) with no significant coronary disease; mean RA 5, PA 28/11, mean PCWP 9, CI 3.39.  Echo (4/16): EF 20% with diffuse hypokinesis, moderate RV systolic dysfunction, large pericardial effusion.  No history of drug or ETOH abuse.  No family history of cardiomyopathy.   Echo (6/16) with EF 15-20%, restrictive  diastolic function, moderate MR, moderately decreased RV systolic function, PA systolic pressure 49 mmHg, moderate-large pericardial effusion without tamponade.  Echo (7/16) with EF 10-15%, mild LV dilation, diffuse hypokinesis, restrictive diastolic dysfunction, moderate MR, moderate to severely decreased RV systolic function, moderate TR, PA systolic pressure 51 mmHg, moderate to large pericardial effusion, no change from 6/16.  2. Pericardial effusion: Large on 4/16 echo.  Patient had pericardial window for diagnostic and therapeutic purposes.  Cytology negative for malignancy.  6/16 echo with recurrence of moderate-large pericardial effusion without tamponade, similar repeat echo in 7/16.  3. Atrial fibrillation: Atrial fibrillation with RVR, converted to NSR with amiodarone.   4. HTN  Current Outpatient Prescriptions  Medication Sig Dispense Refill  . amiodarone (PACERONE) 100 MG tablet Take 2 tablets (200 mg total) by mouth daily. 30 tablet 5  . apixaban (ELIQUIS) 5 MG TABS tablet Take 1 tablet (5 mg total) by mouth 2 (two) times daily. 60 tablet 5  . carvedilol (COREG) 6.25 MG tablet Take 1 tablet (6.25 mg total) by mouth 2 (two) times daily with a meal. 60 tablet 3  . furosemide (LASIX) 40 MG tablet Take 2 tabs in AM and 1 tab in POM 90 tablet 3  . lisinopril (PRINIVIL,ZESTRIL) 2.5 MG tablet Take 1 tablet (2.5 mg total) by mouth 2 (two) times daily. 60 tablet 5  . spironolactone (ALDACTONE) 25 MG tablet Take 0.5 tablets (12.5 mg total) by mouth daily. 30 tablet 5  .  triamcinolone lotion (KENALOG) 0.1 % Apply sparsely twice daily to rash 120 mL 2   No current facility-administered medications for this encounter.    No Known Allergies    Filed Vitals:   10/06/14 1417  BP: 90/48  Pulse: 72  Weight: 98 lb (44.453 kg)  SpO2: 97%    PHYSICAL EXAM: General: Frail.  No respiratory difficulty HEENT: normal Neck: supple. JVP 7-8 cm. Carotids 2+ bilat; no bruits. No lymphadenopathy or  thryomegaly appreciated. Cor: PMI nondisplaced. Regular rate & rhythm. Paradoxical S2 split Lungs: Slightly decreased breath sounds bilaterally.   Abdomen: soft, nontender, + distended. No hepatosplenomegaly. No bruits or masses. Good bowel sounds. Extremities: no cyanosis, clubbing, rash, 1+ ankle edema.   Neuro: alert & oriented x 3, cranial nerves grossly intact. moves all 4 extremities w/o difficulty. Affect pleasant.   ASSESSMENT & PLAN: 1. Chronic systolic HF: Due to nonischemic cardiomyopathy.  Echo (4/16) with EF 20% and moderate RV systolic dysfunction. Cath 07/2014 with no CAD.  Echo in 6/16 showed persistent EF 15-20% with moderate RV dysfunction as well as recurrence of moderate to large pericardial effusion. Echo in 7/16 was similar. Etiology of cardiomyopathy uncertain.  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  No family history of cardiomyopathy, no ETOH or drug abuse.  She is doing much better this appointment, NYHA class II symptoms with minimal volume overload.  However, I think that she remains tenuous.  We may need to begin consideration for advanced therapies sooner rather than later. - Continue Lasix 80 qam, 40 qpm.  BMET/BNP today.   - Continue Coreg 6.25 mg bid, spironolactone 12.5 daily, and lisinopril 2.5 mg bid.  No BP room for titration.   - Will arrange for cardiac MRI to assess for infiltrative disease.  I will also send ANA, SPEP, and UPEP.  - If EF remains low after 6 months or so of medical treatment, will need ICD => may be CRT candidate (LBBB-like IVCD).   - Followup in 2 wks, will likely add digoxin at that time.  2. PAF:  Maintaining NSR on amiodarone. - Continue amio and apixaban.  LFTs/TSH normal in 5/16.  She was told to get regular eye exams while on amiodarone.   3. Pericardial effusion: s/p pericardial window. Transudate, cytology negative.  Unfortunately, last echo in 6/16 showed recurrence of moderate to severe pericardial effusion without  tamponade.  Effusion looked similar on 7/16 echo.  Think we can continue Eliquis for now.  Hopefully this will improve over time with diuresis.   Followup in 2 wks.    Eleonor Ocon,MD 10/07/2014

## 2014-10-08 LAB — UIFE/LIGHT CHAINS/TP QN, 24-HR UR
% BETA, URINE: 32.6 %
ALPHA 1 URINE: 4.7 %
Albumin, U: 42.3 %
Alpha 2, Urine: 11.2 %
FREE LAMBDA LT CHAINS, UR: 10.2 mg/L — AB (ref 0.24–6.66)
Free Kappa/Lambda Ratio: 16.08 — ABNORMAL HIGH (ref 2.04–10.37)
Free Lt Chn Excr Rate: 164 mg/L — ABNORMAL HIGH (ref 1.35–24.19)
GAMMA GLOBULIN URINE: 9.1 %
TOTAL PROTEIN, URINE-UPE24: 26.3 mg/dL

## 2014-10-12 ENCOUNTER — Encounter: Payer: Self-pay | Admitting: Cardiology

## 2014-10-13 DIAGNOSIS — I309 Acute pericarditis, unspecified: Secondary | ICD-10-CM | POA: Diagnosis not present

## 2014-10-13 DIAGNOSIS — I5023 Acute on chronic systolic (congestive) heart failure: Secondary | ICD-10-CM | POA: Diagnosis not present

## 2014-10-13 DIAGNOSIS — I4891 Unspecified atrial fibrillation: Secondary | ICD-10-CM | POA: Diagnosis not present

## 2014-10-13 DIAGNOSIS — I10 Essential (primary) hypertension: Secondary | ICD-10-CM | POA: Diagnosis not present

## 2014-10-14 ENCOUNTER — Encounter (HOSPITAL_COMMUNITY)
Admission: RE | Admit: 2014-10-14 | Discharge: 2014-10-14 | Disposition: A | Discharge status: Home / Self Care | Payer: Medicare Other | Source: Ambulatory Visit | Attending: Cardiology | Admitting: Cardiology

## 2014-10-14 DIAGNOSIS — I5022 Chronic systolic (congestive) heart failure: Secondary | ICD-10-CM | POA: Insufficient documentation

## 2014-10-14 DIAGNOSIS — I429 Cardiomyopathy, unspecified: Secondary | ICD-10-CM | POA: Insufficient documentation

## 2014-10-14 NOTE — Progress Notes (Signed)
Cardiac Rehab Medication Review by a Pharmacist  Does the patient  feel that his/her medications are working for him/her?  no  Has the patient been experiencing any side effects to the medications prescribed?  no  Does the patient measure his/her own blood pressure or blood glucose at home?  No (did previously)   Does the patient have any problems obtaining medications due to transportation or finances?   no  Understanding of regimen: good Understanding of indications: good Potential of compliance: fair    Pharmacist comments: Pt does not feel like medications are working for her.  Pt states she is having frequent shortness of breath and wheezing.  Pt states sometimes misses doses of her meds and is going to soon buy a new BP cuff after her rental was returned.     Synetta Fail, PharmD Pharmacy Resident 10/14/2014 8:31 AM

## 2014-10-15 ENCOUNTER — Telehealth (HOSPITAL_COMMUNITY): Payer: Self-pay | Admitting: *Deleted

## 2014-10-18 ENCOUNTER — Encounter (HOSPITAL_COMMUNITY): Payer: Medicare Other

## 2014-10-20 ENCOUNTER — Encounter (HOSPITAL_COMMUNITY): Payer: Medicare Other

## 2014-10-21 ENCOUNTER — Ambulatory Visit (HOSPITAL_COMMUNITY)
Admission: RE | Admit: 2014-10-21 | Discharge: 2014-10-21 | Disposition: A | Discharge status: Home / Self Care | Payer: Medicare Other | Source: Ambulatory Visit | Attending: Internal Medicine | Admitting: Internal Medicine

## 2014-10-21 ENCOUNTER — Encounter (HOSPITAL_COMMUNITY): Payer: Self-pay

## 2014-10-21 VITALS — BP 118/72 | HR 80 | Wt 104.8 lb

## 2014-10-21 DIAGNOSIS — I319 Disease of pericardium, unspecified: Secondary | ICD-10-CM

## 2014-10-21 DIAGNOSIS — I5022 Chronic systolic (congestive) heart failure: Secondary | ICD-10-CM | POA: Diagnosis not present

## 2014-10-21 DIAGNOSIS — I313 Pericardial effusion (noninflammatory): Secondary | ICD-10-CM

## 2014-10-21 DIAGNOSIS — I3139 Other pericardial effusion (noninflammatory): Secondary | ICD-10-CM

## 2014-10-21 DIAGNOSIS — I480 Paroxysmal atrial fibrillation: Secondary | ICD-10-CM | POA: Diagnosis not present

## 2014-10-21 LAB — BASIC METABOLIC PANEL
ANION GAP: 7 (ref 5–15)
BUN: 22 mg/dL — ABNORMAL HIGH (ref 6–20)
CO2: 21 mmol/L — ABNORMAL LOW (ref 22–32)
Calcium: 9 mg/dL (ref 8.9–10.3)
Chloride: 111 mmol/L (ref 101–111)
Creatinine, Ser: 1.17 mg/dL — ABNORMAL HIGH (ref 0.44–1.00)
GFR calc non Af Amer: 47 mL/min — ABNORMAL LOW (ref >60)
GFR, EST AFRICAN AMERICAN: 54 mL/min — AB (ref >60)
Glucose, Bld: 118 mg/dL — ABNORMAL HIGH (ref 65–99)
Potassium: 5.4 mmol/L — ABNORMAL HIGH (ref 3.5–5.1)
Sodium: 139 mmol/L (ref 135–145)

## 2014-10-21 LAB — BRAIN NATRIURETIC PEPTIDE: B NATRIURETIC PEPTIDE 5: 4365.4 pg/mL — AB (ref 0.0–100.0)

## 2014-10-21 MED ORDER — CARVEDILOL 3.125 MG PO TABS: 3.1250 mg | ORAL_TABLET | Freq: Two times a day (BID) | ORAL | Status: DC

## 2014-10-21 NOTE — Patient Instructions (Signed)
Restart ALL medications as prescribed except... DECREASE Carvedilol (Coreg) to 3.125 mg twice daily. (You can half the tablets you currently have.  The new prescription will be the correct dose tablet.)  Return in 1-2 weeks for lab work.  Follow up in 3 weeks for follow up appointment.  Do the following things EVERYDAY: 1) Weigh yourself in the morning before breakfast. Write it down and keep it in a log. 2) Take your medicines as prescribed 3) Eat low salt foods-Limit salt (sodium) to 2000 mg per day.  4) Stay as active as you can everyday 5) Limit all fluids for the day to less than 2 liters

## 2014-10-22 ENCOUNTER — Encounter (HOSPITAL_COMMUNITY): Payer: Self-pay

## 2014-10-22 ENCOUNTER — Encounter (HOSPITAL_COMMUNITY)
Admission: RE | Admit: 2014-10-22 | Discharge: 2014-10-22 | Disposition: A | Discharge status: Home / Self Care | Payer: Medicare Other | Source: Ambulatory Visit | Attending: Cardiology | Admitting: Cardiology

## 2014-10-22 ENCOUNTER — Telehealth (HOSPITAL_COMMUNITY): Payer: Self-pay | Admitting: Cardiac Rehabilitation

## 2014-10-22 DIAGNOSIS — I429 Cardiomyopathy, unspecified: Secondary | ICD-10-CM | POA: Diagnosis not present

## 2014-10-22 DIAGNOSIS — I5022 Chronic systolic (congestive) heart failure: Secondary | ICD-10-CM | POA: Diagnosis not present

## 2014-10-22 NOTE — Telephone Encounter (Signed)
Lm on vm for pt

## 2014-10-22 NOTE — Telephone Encounter (Signed)
-----   Message from Laurey Morale, MD sent at 10/22/2014 11:57 AM EDT ----- Regarding: RE: cardiac rehab yes ----- Message -----    From: Robyne Peers, RN    Sent: 10/22/2014  10:21 AM      To: Laurey Morale, MD Subject: cardiac rehab                                  Dear Dr. Shirlee Latch,  Please advise if it is okay for pt to begin exercise on Friday 10/22/14?  Thank you, Deveron Furlong, RN, BSN Cardiac Pulmonary Rehab

## 2014-10-22 NOTE — Progress Notes (Signed)
Patient ID: Katrina Peck, female   DOB: [birth year], 69 y.o.   MRN: [id number]   ADVANCED HF CLINIC NOTE  PCP: Dr. Duanne Guess  69 y/o female with history of HTN who was admitted 07/20/14 with 3 months of exertional dyspnea, weight gain, cough, and orthopnea. Found to have EF 20% and a large pericardial effusion. Cath 5/16 no CAD. Hospital course complicated by atrial fibrillation w/ RVR, converted to NSR on amiodarone. Underwent pericardial window (transudate).    She had a repeat echo in 6/16 that showed persistently low LV EF at 15-20% with recurrence of moderate to large pericardial effusion without tamponade. At last appointment, she was short of breath walking around her house.  She had profound fatigue.  +Orthopnea.  I had a repeat echo done (7/16) to followup on pericardial effusion.  This showed EF 10-15%, mild LV dilation, restrictive diastolic function, moderate to severely decreased RV function, and a moderate to large pericardial effusion similar to 6/16.  She did not want to be admitted.   Prior to last appointment, I increased her Lasix to 80 qam, 40 qpm.  She lost weight and was breathing much better.  Still some dyspnea with heavy exertion.  She started looking into things on the internet and saw where sometimes medications have shortness of breath as a side effect.  Therefore, she stopped all her cardiac meds.  Today, she reports increased dyspnea.  Weight is up 6 lbs.  She can walk around the house ok but is short of breath walking any farther.  She has increased edema and increased orthopnea.  No lightheadedness.   ECG: NSR, IVCD 136 msec  Labs (5/16): K 4.6, creatinine 1.0 => 0.81, TSH normal, LFTs normal, TSH normal, BNP 1268 Labs (7/16): K 5.3 => 5.4, creatinine 1.0 => 1.02, HCT 39.7, BNP > 4500 => 1082, SPEP negative, urine IFE negative  SH: Married, no smoking, no ETOH.   FH: No history of cardiomyopathy or sudden death.    ROS: All systems reviewed and negative except as per  HPI.   PMH: 1. Chronic systolic CHF: Nonischemic cardiomyopathy.  LHC/RHC (4/16) with no significant coronary disease; mean RA 5, PA 28/11, mean PCWP 9, CI 3.39.  Echo (4/16): EF 20% with diffuse hypokinesis, moderate RV systolic dysfunction, large pericardial effusion.  No history of drug or ETOH abuse.  No family history of cardiomyopathy.   Echo (6/16) with EF 15-20%, restrictive diastolic function, moderate MR, moderately decreased RV systolic function, PA systolic pressure 49 mmHg, moderate-large pericardial effusion without tamponade.  Echo (7/16) with EF 10-15%, mild LV dilation, diffuse hypokinesis, restrictive diastolic dysfunction, moderate MR, moderate to severely decreased RV systolic function, moderate TR, PA systolic pressure 51 mmHg, moderate to large pericardial effusion, no change from 6/16.  2. Pericardial effusion: Large on 4/16 echo.  Patient had pericardial window for diagnostic and therapeutic purposes.  Cytology negative for malignancy.  6/16 echo with recurrence of moderate-large pericardial effusion without tamponade, similar repeat echo in 7/16.  3. Atrial fibrillation: Atrial fibrillation with RVR, converted to NSR with amiodarone.   4. HTN 5. Noncompliance  Current Outpatient Prescriptions  Medication Sig Dispense Refill  . amiodarone (PACERONE) 100 MG tablet Take 2 tablets (200 mg total) by mouth daily. 30 tablet 5  . apixaban (ELIQUIS) 5 MG TABS tablet Take 1 tablet (5 mg total) by mouth 2 (two) times daily. 60 tablet 5  . carvedilol (COREG) 3.125 MG tablet Take 1 tablet (3.125 mg total) by mouth 2 (two)  times daily with a meal. 60 tablet 6  . furosemide (LASIX) 40 MG tablet Take 2 tabs in AM and 1 tab in POM (Patient taking differently: Take 2 tabs in AM and 1 tab in PM) 90 tablet 3  . lisinopril (PRINIVIL,ZESTRIL) 2.5 MG tablet Take 1 tablet (2.5 mg total) by mouth 2 (two) times daily. 60 tablet 5  . spironolactone (ALDACTONE) 25 MG tablet Take 0.5 tablets (12.5 mg  total) by mouth daily. 30 tablet 5   No current facility-administered medications for this encounter.    No Known Allergies    Filed Vitals:   10/21/14 0940  BP: 118/72  Pulse: 80  Weight: 104 lb 12 oz (47.514 kg)  SpO2: 98%    PHYSICAL EXAM: General: Frail.  No respiratory difficulty HEENT: normal Neck: supple. JVP 12 cm. Carotids 2+ bilat; no bruits. No lymphadenopathy or thryomegaly appreciated. Cor: PMI nondisplaced. Regular rate & rhythm. Paradoxical S2 split Lungs: Slightly decreased breath sounds bilaterally.   Abdomen: soft, nontender, + distended. No hepatosplenomegaly. No bruits or masses. Good bowel sounds. Extremities: no cyanosis, clubbing, rash, 1+ edema to knees bilaterally.   Neuro: alert & oriented x 3, cranial nerves grossly intact. moves all 4 extremities w/o difficulty. Affect pleasant.   ASSESSMENT & PLAN: 1. Chronic systolic HF: Due to nonischemic cardiomyopathy.  Echo (4/16) with EF 20% and moderate RV systolic dysfunction. Cath 07/2014 with no CAD.  Echo in 6/16 showed persistent EF 15-20% with moderate RV dysfunction as well as recurrence of moderate to large pericardial effusion. Echo in 7/16 was similar. Etiology of cardiomyopathy uncertain.  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  No family history of cardiomyopathy, no ETOH or drug abuse.  SPEP/urine IFE negative.  At last appointment she was doing better, but stopped all her meds based on things she was reading on the internet.  Predictably, she has gained 6 lbs and now has NYHA class IIIb symptom.  - Restart Lasix 80 qam, 40 qpm.  BMET/BNP today.   - Restart lisinopril 2.5 mg bid, hold off on spironolactone until we see how high K is today.  - I will restart her on a lower dose of Coreg given volume overload => 3.125 mg bid.    - She is scheduled for a cardiac MRI to assess for infiltrative disease or evidence for prior myocarditis.  - If EF remains low after 6 months or so of medical  treatment, will need ICD => may be CRT candidate (LBBB-like IVCD).  - She has advanced heart failure with high risk of morbidity/mortality over the next 6 months.  I had a long discussion with her about the importance of compliance with her medication regimen.  She may progress soon to the point where she needs something more than medication for support such as LVAD.  However, based on my experience with her so far, I am unsure if she would be willing to do this.  For now, will focus on getting her on track with her medication regimen.   - Followup in 1 week to reassess volume status and repeat BMET, will consider addition of digoxin at that time (will not start today as I am trying to convince her to restart the meds she was on prior).  2. PAF:  Maintaining NSR.  She has stopped amiodarone and apixaban. - She will restart amiodarone.  LFTs/TSH normal in 5/16.  She was told to get regular eye exams while on amiodarone.   - She will restart apixaban.  3. Pericardial effusion: s/p pericardial window. Transudate, cytology negative.  Unfortunately, last echo in 6/16 showed recurrence of moderate to severe pericardial effusion without tamponade.  Effusion looked similar on 7/16 echo.  Think we can continue Eliquis for now.  Hopefully this will improve over time with diuresis.   Followup in 1 week    Fransico Meadow 10/22/2014

## 2014-10-22 NOTE — Progress Notes (Signed)
Pt started cardiac rehab today.  Pt tolerated light exercise without difficulty. VSS, telemetry-sinus rhythm, asymptomatic.  Medication list reconciled.  Pt verbalized compliance with medications and denies barriers to compliance. PSYCHOSOCIAL ASSESSMENT:  PHQ-0, although pt does report some stress/anxiety from her recent cardiac illness and her inability to do tasks as prior to her illness. Pt is concerned about her dyspnea and fatigue.   Pt exhibits positive coping skills, hopeful outlook with supportive family. No psychosocial needs identified at this time, no psychosocial interventions necessary.    Pt enjoys traveling, eating and spending time with others.  Pt also enjoys helping others which she has been unable to do since her illness.   Pt cardiac rehab  goal is  to get better and get stronger.  Pt encouraged to participate in home exercise and cardiac education classes to increase ability to achieve these goals.   Pt also expressed desire to come off all medications as a goal.  Pt advised this is probably unrealistic goal due to cardiac benefit her medications offer.  Pt reports she eats at [facility] getting grilled chicken but rinses and reheats it in an attempt decrease sodium content.  Pt advised more beneficial to cook chicken without adding salt. Pt instructed on negative effects of high sodium foods and CHF.   Pt encouraged to participate in nutritional class offerings.   Pt oriented to exercise equipment and routine.  Understanding verbalized.

## 2014-10-25 ENCOUNTER — Encounter (HOSPITAL_COMMUNITY)
Admission: RE | Admit: 2014-10-25 | Discharge: 2014-10-25 | Disposition: A | Discharge status: Home / Self Care | Payer: Medicare Other | Source: Ambulatory Visit | Attending: Cardiology | Admitting: Cardiology

## 2014-10-25 DIAGNOSIS — I429 Cardiomyopathy, unspecified: Secondary | ICD-10-CM | POA: Insufficient documentation

## 2014-10-25 DIAGNOSIS — I5022 Chronic systolic (congestive) heart failure: Secondary | ICD-10-CM | POA: Diagnosis not present

## 2014-10-27 ENCOUNTER — Ambulatory Visit (HOSPITAL_COMMUNITY)
Admission: RE | Admit: 2014-10-27 | Discharge: 2014-10-27 | Disposition: A | Discharge status: Home / Self Care | Payer: Medicare Other | Source: Ambulatory Visit | Attending: Internal Medicine | Admitting: Internal Medicine

## 2014-10-27 ENCOUNTER — Encounter (HOSPITAL_COMMUNITY)
Admission: RE | Admit: 2014-10-27 | Discharge: 2014-10-27 | Disposition: A | Discharge status: Home / Self Care | Payer: Medicare Other | Source: Ambulatory Visit | Attending: Cardiology | Admitting: Cardiology

## 2014-10-27 DIAGNOSIS — I5022 Chronic systolic (congestive) heart failure: Secondary | ICD-10-CM | POA: Insufficient documentation

## 2014-10-27 LAB — BASIC METABOLIC PANEL
ANION GAP: 8 (ref 5–15)
BUN: 19 mg/dL (ref 6–20)
CO2: 23 mmol/L (ref 22–32)
CREATININE: 1.03 mg/dL — AB (ref 0.44–1.00)
Calcium: 8.8 mg/dL — ABNORMAL LOW (ref 8.9–10.3)
Chloride: 108 mmol/L (ref 101–111)
GFR, EST NON AFRICAN AMERICAN: 55 mL/min — AB (ref >60)
Glucose, Bld: 85 mg/dL (ref 65–99)
POTASSIUM: 3.7 mmol/L (ref 3.5–5.1)
Sodium: 139 mmol/L (ref 135–145)

## 2014-10-27 LAB — BRAIN NATRIURETIC PEPTIDE: B Natriuretic Peptide: 3613.4 pg/mL — ABNORMAL HIGH (ref 0.0–100.0)

## 2014-10-29 ENCOUNTER — Ambulatory Visit (HOSPITAL_COMMUNITY): Admission: RE | Admit: 2014-10-29 | Payer: Medicare Other | Source: Ambulatory Visit

## 2014-10-29 ENCOUNTER — Encounter (HOSPITAL_COMMUNITY)
Admission: RE | Admit: 2014-10-29 | Discharge: 2014-10-29 | Disposition: A | Discharge status: Home / Self Care | Payer: Medicare Other | Source: Ambulatory Visit | Attending: Cardiology | Admitting: Cardiology

## 2014-10-29 DIAGNOSIS — I5022 Chronic systolic (congestive) heart failure: Secondary | ICD-10-CM | POA: Diagnosis not present

## 2014-10-29 DIAGNOSIS — I429 Cardiomyopathy, unspecified: Secondary | ICD-10-CM | POA: Diagnosis not present

## 2014-11-01 ENCOUNTER — Encounter (HOSPITAL_COMMUNITY)
Admission: RE | Admit: 2014-11-01 | Discharge: 2014-11-01 | Disposition: A | Discharge status: Home / Self Care | Payer: Medicare Other | Source: Ambulatory Visit | Attending: Cardiology | Admitting: Cardiology

## 2014-11-01 DIAGNOSIS — I5022 Chronic systolic (congestive) heart failure: Secondary | ICD-10-CM | POA: Diagnosis not present

## 2014-11-01 DIAGNOSIS — I429 Cardiomyopathy, unspecified: Secondary | ICD-10-CM | POA: Diagnosis not present

## 2014-11-03 ENCOUNTER — Encounter (HOSPITAL_COMMUNITY)
Admission: RE | Admit: 2014-11-03 | Discharge: 2014-11-03 | Disposition: A | Discharge status: Home / Self Care | Payer: Medicare Other | Source: Ambulatory Visit | Attending: Cardiology | Admitting: Cardiology

## 2014-11-03 DIAGNOSIS — I5022 Chronic systolic (congestive) heart failure: Secondary | ICD-10-CM | POA: Diagnosis not present

## 2014-11-03 DIAGNOSIS — I429 Cardiomyopathy, unspecified: Secondary | ICD-10-CM | POA: Diagnosis not present

## 2014-11-04 ENCOUNTER — Telehealth (HOSPITAL_COMMUNITY): Payer: Self-pay | Admitting: Cardiology

## 2014-11-04 NOTE — Telephone Encounter (Signed)
Pt aware and voiced understanding 

## 2014-11-05 ENCOUNTER — Encounter (HOSPITAL_COMMUNITY)
Admission: RE | Admit: 2014-11-05 | Discharge: 2014-11-05 | Disposition: A | Discharge status: Home / Self Care | Payer: Medicare Other | Source: Ambulatory Visit | Attending: Cardiology | Admitting: Cardiology

## 2014-11-05 NOTE — Progress Notes (Signed)
Patient reported feeling weak this afternoon. Blood pressure 112/70. Oxygen saturation 100% on room air. Patient reports that she only at some juice and and a few crackers when she got up today at 11:30. I advised that Katrina Peck not exercise today. Patient also reports was noted to be very short of breath when walking a few steps. Repeat blood pressure 118/82 heart rate 90. Sinus rhythm with a bundle branch block. Upon assessment lung fields clear upon ascultation. 1+ right ankle edema noted. The heart failure clinic called and notified about today's complaints. I spoke with Chantel CMA.  Patient was given peanut butter and graham crackers and juice. Patient instructed to eat a meal before coming to exercise at cardiac rehab. Patient states understanding.

## 2014-11-08 ENCOUNTER — Telehealth (HOSPITAL_COMMUNITY): Payer: Self-pay | Admitting: General Practice

## 2014-11-08 ENCOUNTER — Encounter (HOSPITAL_COMMUNITY): Payer: Medicare Other

## 2014-11-09 ENCOUNTER — Telehealth: Payer: Self-pay | Admitting: Cardiology

## 2014-11-09 NOTE — Telephone Encounter (Signed)
Forms signed by Dr Shirlee Latch today and faxed in

## 2014-11-09 NOTE — Telephone Encounter (Signed)
New Message   Judeth Cornfield is calling from [facility] and she   Is waiting back to see if Documents have been signed and ready to be faxed back to her office.  pretaining to the pt [facility] care plan.

## 2014-11-10 ENCOUNTER — Encounter (HOSPITAL_COMMUNITY)
Admission: RE | Admit: 2014-11-10 | Discharge: 2014-11-10 | Disposition: A | Discharge status: Home / Self Care | Payer: Medicare Other | Source: Ambulatory Visit | Attending: Cardiology | Admitting: Cardiology

## 2014-11-10 NOTE — Progress Notes (Signed)
Patient returned to exercise today. Katrina Peck continues to complain of shortness of breath whenever she exerts herself. Oxygen saturation 97% on room air. Lung fields essentially clear but diminished in the right base. Positive edema present in the patients right lower leg and right ankle. Patient denies feeling short of breath at rest. Katrina Peck's weight is up 1.5 kg from Monday. The heart failure clinic called and notified. Katrina Peck spoke with Megan heart failure clinic RN over the phone.  Katrina Peck has an appointment at the heart failure clinic tomorrow. Will fax exercise flow sheets to Dr. Alford Highland office for review. I asked the patient to bring her medication bottles to her appointment at the heart failure clinic tomorrow.  Katrina Peck says she will bring her list.

## 2014-11-11 ENCOUNTER — Inpatient Hospital Stay (HOSPITAL_COMMUNITY): Payer: Medicare Other

## 2014-11-11 ENCOUNTER — Ambulatory Visit (HOSPITAL_BASED_OUTPATIENT_CLINIC_OR_DEPARTMENT_OTHER)
Admission: RE | Admit: 2014-11-11 | Discharge: 2014-11-11 | Disposition: A | Discharge status: Home / Self Care | Payer: Medicare Other | Source: Ambulatory Visit | Attending: Internal Medicine | Admitting: Internal Medicine

## 2014-11-11 ENCOUNTER — Inpatient Hospital Stay (HOSPITAL_COMMUNITY)
Admission: AD | Admit: 2014-11-11 | Discharge: 2014-11-15 | DRG: 292 | Disposition: A | Discharge status: Home / Self Care | Payer: Medicare Other | Source: Ambulatory Visit | Attending: Cardiology | Admitting: Cardiology

## 2014-11-11 ENCOUNTER — Encounter (HOSPITAL_COMMUNITY): Payer: Self-pay

## 2014-11-11 VITALS — BP 130/72 | HR 60 | Wt 110.1 lb

## 2014-11-11 DIAGNOSIS — Z79899 Other long term (current) drug therapy: Secondary | ICD-10-CM | POA: Diagnosis not present

## 2014-11-11 DIAGNOSIS — Z7901 Long term (current) use of anticoagulants: Secondary | ICD-10-CM | POA: Diagnosis not present

## 2014-11-11 DIAGNOSIS — E876 Hypokalemia: Secondary | ICD-10-CM | POA: Diagnosis present

## 2014-11-11 DIAGNOSIS — R946 Abnormal results of thyroid function studies: Secondary | ICD-10-CM | POA: Diagnosis present

## 2014-11-11 DIAGNOSIS — I313 Pericardial effusion (noninflammatory): Secondary | ICD-10-CM | POA: Diagnosis present

## 2014-11-11 DIAGNOSIS — I5021 Acute systolic (congestive) heart failure: Secondary | ICD-10-CM

## 2014-11-11 DIAGNOSIS — R0600 Dyspnea, unspecified: Secondary | ICD-10-CM

## 2014-11-11 DIAGNOSIS — I5043 Acute on chronic combined systolic (congestive) and diastolic (congestive) heart failure: Secondary | ICD-10-CM | POA: Diagnosis present

## 2014-11-11 DIAGNOSIS — I959 Hypotension, unspecified: Secondary | ICD-10-CM | POA: Diagnosis not present

## 2014-11-11 DIAGNOSIS — R0602 Shortness of breath: Secondary | ICD-10-CM | POA: Diagnosis not present

## 2014-11-11 DIAGNOSIS — I3139 Other pericardial effusion (noninflammatory): Secondary | ICD-10-CM

## 2014-11-11 DIAGNOSIS — I5023 Acute on chronic systolic (congestive) heart failure: Principal | ICD-10-CM | POA: Diagnosis present

## 2014-11-11 DIAGNOSIS — I429 Cardiomyopathy, unspecified: Secondary | ICD-10-CM | POA: Diagnosis present

## 2014-11-11 DIAGNOSIS — I319 Disease of pericardium, unspecified: Secondary | ICD-10-CM

## 2014-11-11 DIAGNOSIS — I5022 Chronic systolic (congestive) heart failure: Secondary | ICD-10-CM

## 2014-11-11 DIAGNOSIS — I480 Paroxysmal atrial fibrillation: Secondary | ICD-10-CM | POA: Diagnosis present

## 2014-11-11 DIAGNOSIS — Z9119 Patient's noncompliance with other medical treatment and regimen: Secondary | ICD-10-CM | POA: Diagnosis present

## 2014-11-11 DIAGNOSIS — Z9114 Patient's other noncompliance with medication regimen: Secondary | ICD-10-CM | POA: Diagnosis present

## 2014-11-11 DIAGNOSIS — I10 Essential (primary) hypertension: Secondary | ICD-10-CM | POA: Diagnosis present

## 2014-11-11 DIAGNOSIS — I509 Heart failure, unspecified: Secondary | ICD-10-CM

## 2014-11-11 DIAGNOSIS — J9811 Atelectasis: Secondary | ICD-10-CM | POA: Diagnosis not present

## 2014-11-11 DIAGNOSIS — I428 Other cardiomyopathies: Secondary | ICD-10-CM

## 2014-11-11 LAB — CBC
HCT: 44.7 % (ref 36.0–46.0)
HEMOGLOBIN: 15.1 g/dL — AB (ref 12.0–15.0)
MCH: 29.3 pg (ref 26.0–34.0)
MCHC: 33.8 g/dL (ref 30.0–36.0)
MCV: 86.6 fL (ref 78.0–100.0)
PLATELETS: DECREASED 10*3/uL (ref 150–400)
RBC: 5.16 MIL/uL — ABNORMAL HIGH (ref 3.87–5.11)
RDW: 16.4 % — ABNORMAL HIGH (ref 11.5–15.5)
WBC: 9 10*3/uL (ref 4.0–10.5)

## 2014-11-11 LAB — BASIC METABOLIC PANEL
ANION GAP: 9 (ref 5–15)
Anion gap: 10 (ref 5–15)
BUN: 21 mg/dL — ABNORMAL HIGH (ref 6–20)
BUN: 20 mg/dL (ref 6–20)
CALCIUM: 9.3 mg/dL (ref 8.9–10.3)
CHLORIDE: 110 mmol/L (ref 101–111)
CO2: 20 mmol/L — ABNORMAL LOW (ref 22–32)
CO2: 23 mmol/L (ref 22–32)
Calcium: 9.1 mg/dL (ref 8.9–10.3)
Chloride: 111 mmol/L (ref 101–111)
Creatinine, Ser: 1.08 mg/dL — ABNORMAL HIGH (ref 0.44–1.00)
Creatinine, Ser: 1.11 mg/dL — ABNORMAL HIGH (ref 0.44–1.00)
GFR, EST AFRICAN AMERICAN: 60 mL/min — AB (ref >60)
GFR, EST AFRICAN AMERICAN: 58 mL/min — AB (ref >60)
GFR, EST NON AFRICAN AMERICAN: 52 mL/min — AB (ref >60)
GFR, EST NON AFRICAN AMERICAN: 50 mL/min — AB (ref >60)
Glucose, Bld: 117 mg/dL — ABNORMAL HIGH (ref 65–99)
Glucose, Bld: 109 mg/dL — ABNORMAL HIGH (ref 65–99)
POTASSIUM: 6.6 mmol/L — AB (ref 3.5–5.1)
POTASSIUM: 4.6 mmol/L (ref 3.5–5.1)
SODIUM: 142 mmol/L (ref 135–145)
Sodium: 141 mmol/L (ref 135–145)

## 2014-11-11 LAB — BRAIN NATRIURETIC PEPTIDE: B Natriuretic Peptide: 3454.9 pg/mL — ABNORMAL HIGH (ref 0.0–100.0)

## 2014-11-11 LAB — MRSA PCR SCREENING: MRSA by PCR: NEGATIVE

## 2014-11-11 LAB — TSH: TSH: 5.673 u[IU]/mL — AB (ref 0.350–4.500)

## 2014-11-11 MED ORDER — APIXABAN 5 MG PO TABS
5.0000 mg | ORAL_TABLET | Freq: Two times a day (BID) | ORAL | Status: DC
  Administered 2014-11-11 – 2014-11-15 (×8): 5 mg via ORAL
  Filled 2014-11-11 (×12): qty 1

## 2014-11-11 MED ORDER — LISINOPRIL 2.5 MG PO TABS: 2.5000 mg | ORAL_TABLET | Freq: Two times a day (BID) | ORAL | Status: DC

## 2014-11-11 MED ORDER — CARVEDILOL 3.125 MG PO TABS
3.1250 mg | ORAL_TABLET | Freq: Two times a day (BID) | ORAL | Status: DC
  Administered 2014-11-11: 3.125 mg via ORAL
  Filled 2014-11-11 (×3): qty 1

## 2014-11-11 MED ORDER — SODIUM CHLORIDE 0.9 % IJ SOLN
10.0000 mL | INTRAMUSCULAR | Status: DC | PRN
  Administered 2014-11-13 – 2014-11-15 (×5): 20 mL
  Filled 2014-11-11 (×5): qty 40

## 2014-11-11 MED ORDER — SODIUM CHLORIDE 0.9 % IJ SOLN
3.0000 mL | Freq: Two times a day (BID) | INTRAMUSCULAR | Status: DC
  Administered 2014-11-11 – 2014-11-14 (×5): 3 mL via INTRAVENOUS

## 2014-11-11 MED ORDER — FUROSEMIDE 10 MG/ML IJ SOLN
10.0000 mg/h | INTRAVENOUS | Status: DC
  Administered 2014-11-11: 10 mg/h via INTRAVENOUS
  Filled 2014-11-11: qty 25

## 2014-11-11 MED ORDER — DIGOXIN 250 MCG PO TABS
0.2500 mg | ORAL_TABLET | Freq: Once | ORAL | Status: AC
  Administered 2014-11-11: 0.25 mg via ORAL
  Filled 2014-11-11: qty 1

## 2014-11-11 MED ORDER — SODIUM CHLORIDE 0.9 % IJ SOLN: 3.0000 mL | INTRAMUSCULAR | Status: DC | PRN

## 2014-11-11 MED ORDER — SODIUM CHLORIDE 0.9 % IJ SOLN
10.0000 mL | Freq: Two times a day (BID) | INTRAMUSCULAR | Status: DC
  Administered 2014-11-11 – 2014-11-14 (×5): 10 mL

## 2014-11-11 MED ORDER — ACETAMINOPHEN 325 MG PO TABS: 650.0000 mg | ORAL_TABLET | ORAL | Status: DC | PRN

## 2014-11-11 MED ORDER — AMIODARONE HCL 200 MG PO TABS
200.0000 mg | ORAL_TABLET | Freq: Every day | ORAL | Status: DC
  Administered 2014-11-12 – 2014-11-15 (×4): 200 mg via ORAL
  Filled 2014-11-11 (×4): qty 1

## 2014-11-11 MED ORDER — DIGOXIN 125 MCG PO TABS
0.1250 mg | ORAL_TABLET | Freq: Every day | ORAL | Status: DC
  Administered 2014-11-12 – 2014-11-15 (×4): 0.125 mg via ORAL
  Filled 2014-11-11 (×4): qty 1

## 2014-11-11 MED ORDER — ONDANSETRON HCL 4 MG/2ML IJ SOLN: 4.0000 mg | Freq: Four times a day (QID) | INTRAMUSCULAR | Status: DC | PRN

## 2014-11-11 MED ORDER — SODIUM CHLORIDE 0.9 % IV SOLN: 250.0000 mL | INTRAVENOUS | Status: DC | PRN

## 2014-11-11 NOTE — Progress Notes (Signed)
Peripherally Inserted Central Catheter/Midline Placement  The IV Nurse has discussed with the patient and/or persons authorized to consent for the patient, the purpose of this procedure and the potential benefits and risks involved with this procedure.  The benefits include less needle sticks, lab draws from the catheter and patient may be discharged home with the catheter.  Risks include, but not limited to, infection, bleeding, blood clot (thrombus formation), and puncture of an artery; nerve damage and irregular heat beat.  Alternatives to this procedure were also discussed.  PICC/Midline Placement Documentation        Katrina Peck 11/11/2014, 6:14 PM

## 2014-11-11 NOTE — H&P (Signed)
69 y/o female with history of HTN who was admitted 07/20/14 with 3 months of exertional dyspnea, weight gain, cough, and orthopnea. Found to have EF 20% and a large pericardial effusion. Cath 5/16 no CAD. Hospital course complicated by atrial fibrillation w/ RVR, converted to NSR on amiodarone. Underwent pericardial window (transudate).   She had a repeat echo in 6/16 that showed persistently low LV EF at 15-20% with recurrence of moderate to large pericardial effusion without tamponade. This showed EF 10-15%, mild LV dilation, restrictive diastolic function, moderate to severely decreased RV function, and a moderate to large pericardial effusion similar to 6/16. She did not want to be admitted.   She presents today for follow up. At last visit we resumed her lisinopril, lasix, and coreg. She had stopped them herself as she felt they weren't helping. She says she is currently not taking any lasix because she felt it was making her sick. Has not had diuretics on over a week. Increased dyspnea on exertion. + Orthopnea. Weight at home trending up to 107 pounds.   Labs (5/16): K 4.6, creatinine 1.0 => 0.81, TSH normal, LFTs normal, TSH normal, BNP 1268 Labs (7/16): K 5.3 => 5.4, creatinine 1.0 => 1.02, HCT 39.7, BNP > 4500 => 1082, SPEP negative, urine IFE negative  SH: Married, no smoking, no ETOH.   FH: No history of cardiomyopathy or sudden death.   ROS: All systems reviewed and negative except as per HPI.   PMH: 1. Chronic systolic CHF: Nonischemic cardiomyopathy. LHC/RHC (4/16) with no significant coronary disease; mean RA 5, PA 28/11, mean PCWP 9, CI 3.39. Echo (4/16): EF 20% with diffuse hypokinesis, moderate RV systolic dysfunction, large pericardial effusion. No history of drug or ETOH abuse. No family history of cardiomyopathy. Echo (6/16) with EF 15-20%, restrictive diastolic function, moderate MR, moderately decreased RV systolic function, PA systolic pressure 49 mmHg,  moderate-large pericardial effusion without tamponade. Echo (7/16) with EF 10-15%, mild LV dilation, diffuse hypokinesis, restrictive diastolic dysfunction, moderate MR, moderate to severely decreased RV systolic function, moderate TR, PA systolic pressure 51 mmHg, moderate to large pericardial effusion, no change from 6/16.  2. Pericardial effusion: Large on 4/16 echo. Patient had pericardial window for diagnostic and therapeutic purposes. Cytology negative for malignancy. 6/16 echo with recurrence of moderate-large pericardial effusion without tamponade, similar repeat echo in 7/16.  3. Atrial fibrillation: Atrial fibrillation with RVR, converted to NSR with amiodarone.  4. HTN 5. Noncompliance  Current Outpatient Prescriptions  Medication Sig Dispense Refill  . amiodarone (PACERONE) 100 MG tablet Take 2 tablets (200 mg total) by mouth daily. 30 tablet 5  . apixaban (ELIQUIS) 5 MG TABS tablet Take 1 tablet (5 mg total) by mouth 2 (two) times daily. 60 tablet 5  . carvedilol (COREG) 3.125 MG tablet Take 1 tablet (3.125 mg total) by mouth 2 (two) times daily with a meal. 60 tablet 6  . lisinopril (PRINIVIL,ZESTRIL) 2.5 MG tablet Take 1 tablet (2.5 mg total) by mouth 2 (two) times daily. 60 tablet 5  . furosemide (LASIX) 40 MG tablet Take 2 tabs in AM and 1 tab in POM (Patient not taking: Reported on 11/11/2014) 90 tablet 3   No current facility-administered medications for this encounter.    No Known Allergies    Filed Vitals:   11/11/14 1229  BP: 130/72  Pulse: 60  Weight: 110 lb 2 oz (49.952 kg)  SpO2: 97%    PHYSICAL EXAM: General: Elderly, chronically ill appearing. Dyspneic at rest HEENT: normal  Neck: supple. JVP To ear. Carotids 2+ bilat; no bruits. No lymphadenopathy or thryomegaly appreciated. Cor: PMI nondisplaced. Regular rate & rhythm. Paradoxical S2 split Lungs: Crackles bibasilarly  Abdomen: soft, nontender, +  distended. No hepatosplenomegaly. No bruits or masses. +BS Extremities: no cyanosis, clubbing, rash, RLE and LLE 3+ Extends to thighs.  Neuro: alert & oriented x 3, cranial nerves grossly intact. moves all 4 extremities w/o difficulty. Affect pleasant.   ASSESSMENT & PLAN: 1. Chronic systolic HF: Due to nonischemic cardiomyopathy. Echo (4/16) with EF 20% and moderate RV systolic dysfunction. Cath 07/2014 with no CAD. Echo in 6/16 showed persistent EF 15-20% with moderate RV dysfunction as well as recurrence of moderate to large pericardial effusion. Echo in 7/16 was similar. Etiology of cardiomyopathy uncertain. 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 No family history of cardiomyopathy, no ETOH or drug abuse. SPEP/urine IFE negative. She has gained another 6 lbs and now has NYHA class IIIb-VI symptom. Marked volume overload.  - Refuses to take po lasix. Agrees to IV lasix. Start lasix drip 10 mg per hour.  - Continue lisinopril 2.5 mg bid,  - Check lab work now.  - Repeat ECHO. Unable to get MRI due to marked volume overload.  - She has advanced heart failure with high risk of morbidity/mortality over the next 6 months 2. PAF: Maintaining NSR.  - Continue amiodarone. LFTs/TSH normal in 5/16. She was told to get regular eye exams while on amiodarone.  - Continue apixaban.  3. Pericardial effusion: s/p pericardial window. Transudate, cytology negative. Unfortunately, last echo in 6/16 showed recurrence of moderate to severe pericardial effusion without tamponade. Effusion looked similar on 7/16 echo. Repeat ECHO now    Admit 2H for IV diuresis. Place PICC to monitor CVP and CO-OX. Need palliative care for goals of care. She is difficult to manage due to medication noncompliance and poor insight.   CLEGG,AMY NP-C  1:15 PM      Patient seen with NP, agree with the above note.  Unfortunately, she has not taken her Lasix for several weeks now and  returns volume overloaded to clinic.  Compliance with meds has been a major issue.  She is short of breath with minimal exertion and needs to be admitted.   - Start lasix gtt 10 mg/hr - Start digoxin 0.25 today then 0.125 mg daily after that - Continue amiodarone and apixaban, ECG to make sure maintaining NSR.   - Lisinopril 2.5 mg bid.  - PICC to monitor co-ox and CVP - Will repeat echo to monitor pericardial effusion.  She says that she would not have another pericardial window.   - Noncompliance is an ongoing major barrier here.   [name] 11/11/2014 1:46 PM

## 2014-11-11 NOTE — Progress Notes (Signed)
  Echocardiogram 2D Echocardiogram has been performed.  Arvil Chaco 11/11/2014, 3:26 PM

## 2014-11-11 NOTE — Progress Notes (Signed)
Patient ID: Katrina Peck, female   DOB: Apr 11, 1945, 69 y.o.   MRN: [id number]   ADVANCED HF CLINIC NOTE  PCP: Dr. Duanne Guess  69 y/o female with history of HTN who was admitted 07/20/14 with 3 months of exertional dyspnea, weight gain, cough, and orthopnea. Found to have EF 20% and a large pericardial effusion. Cath 5/16 no CAD. Hospital course complicated by atrial fibrillation w/ RVR, converted to NSR on amiodarone. Underwent pericardial window (transudate).    She had a repeat echo in 6/16 that showed persistently low LV EF at 15-20% with recurrence of moderate to large pericardial effusion without tamponade. This showed EF 10-15%, mild LV dilation, restrictive diastolic function, moderate to severely decreased RV function, and a moderate to large pericardial effusion similar to 6/16.  She did not want to be admitted.   She presents today for follow up.  At last visit we resumed her lisinopril, lasix, and coreg.  She had stopped them herself as she felt they weren't helping. She says she is currently not taking any lasix because she felt it was making her sick. Has not had diuretics on over a week.  Increased dyspnea on exertion. + Orthopnea. Weight at home trending up to 107 pounds.    Labs (5/16): K 4.6, creatinine 1.0 => 0.81, TSH normal, LFTs normal, TSH normal, BNP 1268 Labs (7/16): K 5.3 => 5.4, creatinine 1.0 => 1.02, HCT 39.7, BNP > 4500 => 1082, SPEP negative, urine IFE negative  SH: Married, no smoking, no ETOH.   FH: No history of cardiomyopathy or sudden death.    ROS: All systems reviewed and negative except as per HPI.   PMH: 1. Chronic systolic CHF: Nonischemic cardiomyopathy.  LHC/RHC (4/16) with no significant coronary disease; mean RA 5, PA 28/11, mean PCWP 9, CI 3.39.  Echo (4/16): EF 20% with diffuse hypokinesis, moderate RV systolic dysfunction, large pericardial effusion.  No history of drug or ETOH abuse.  No family history of cardiomyopathy.   Echo (6/16) with EF 15-20%,  restrictive diastolic function, moderate MR, moderately decreased RV systolic function, PA systolic pressure 49 mmHg, moderate-large pericardial effusion without tamponade.  Echo (7/16) with EF 10-15%, mild LV dilation, diffuse hypokinesis, restrictive diastolic dysfunction, moderate MR, moderate to severely decreased RV systolic function, moderate TR, PA systolic pressure 51 mmHg, moderate to large pericardial effusion, no change from 6/16.  2. Pericardial effusion: Large on 4/16 echo.  Patient had pericardial window for diagnostic and therapeutic purposes.  Cytology negative for malignancy.  6/16 echo with recurrence of moderate-large pericardial effusion without tamponade, similar repeat echo in 7/16.  3. Atrial fibrillation: Atrial fibrillation with RVR, converted to NSR with amiodarone.   4. HTN 5. Noncompliance  Current Outpatient Prescriptions  Medication Sig Dispense Refill  . amiodarone (PACERONE) 100 MG tablet Take 2 tablets (200 mg total) by mouth daily. 30 tablet 5  . apixaban (ELIQUIS) 5 MG TABS tablet Take 1 tablet (5 mg total) by mouth 2 (two) times daily. 60 tablet 5  . carvedilol (COREG) 3.125 MG tablet Take 1 tablet (3.125 mg total) by mouth 2 (two) times daily with a meal. 60 tablet 6  . lisinopril (PRINIVIL,ZESTRIL) 2.5 MG tablet Take 1 tablet (2.5 mg total) by mouth 2 (two) times daily. 60 tablet 5  . furosemide (LASIX) 40 MG tablet Take 2 tabs in AM and 1 tab in POM (Patient not taking: Reported on 11/11/2014) 90 tablet 3   No current facility-administered medications for this encounter.  No Known Allergies    Filed Vitals:   11/11/14 1229  BP: 130/72  Pulse: 60  Weight: 110 lb 2 oz (49.952 kg)  SpO2: 97%    PHYSICAL EXAM: General: Elderly, chronically ill appearing.  Dyspneic at rest HEENT: normal Neck: supple. JVP  To ear.  Carotids 2+ bilat; no bruits. No lymphadenopathy or thryomegaly appreciated. Cor: PMI nondisplaced. Regular rate & rhythm. Paradoxical S2  split Lungs: Crackles bibasilarly  Abdomen: soft, nontender, + distended. No hepatosplenomegaly. No bruits or masses. +BS Extremities: no cyanosis, clubbing, rash, RLE and LLE 3+  Extends to thighs.    Neuro: alert & oriented x 3, cranial nerves grossly intact. moves all 4 extremities w/o difficulty. Affect pleasant.   ASSESSMENT & PLAN: 1. Chronic systolic HF: Due to nonischemic cardiomyopathy.  Echo (4/16) with EF 20% and moderate RV systolic dysfunction. Cath 07/2014 with no CAD.  Echo in 6/16 showed persistent EF 15-20% with moderate RV dysfunction as well as recurrence of moderate to large pericardial effusion. Echo in 7/16 was similar. Etiology of cardiomyopathy uncertain.  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  No family history of cardiomyopathy, no ETOH or drug abuse.  SPEP/urine IFE negative.  She has gained another 6 lbs and now has NYHA class IIIb-VI symptom.  - Refuses to take po lasix. Agrees to IV lasix.  Start lasix drip 10 mg per hour.  - Continue lisinopril 2.5 mg bid,  - Hold BB.  - Check lab work now.  - Repeat ECHO. Unable to get  MRI due to marked volume overload.  - She has advanced heart failure with high risk of morbidity/mortality over the next 6 months 2. PAF:  Maintaining NSR.   - Continue amiodarone.  LFTs/TSH normal in 5/16.  She was told to get regular eye exams while on amiodarone.  - Continue apixaban.  3. Pericardial effusion: s/p pericardial window. Transudate, cytology negative.  Unfortunately, last echo in 6/16 showed recurrence of moderate to severe pericardial effusion without tamponade.  Effusion looked similar on 7/16 echo.  Repeat ECHO   Admit 2H for IV diuresis. Place PICC to monitor CVP and CO-OX. Need palliative care for goals of care. She is difficult to manage due to medication noncompliance and poor insight.   CLEGG,AMY NP-C  1:15 PM

## 2014-11-11 NOTE — Care Management Note (Signed)
Case Management Note  Patient Details  Name: YANARA SCHENK MRN: [id number] Date of Birth: 03/26/1946  Subjective/Objective:      Adm w heart failure              Action/Plan: lives w fam   Expected Discharge Date:                  Expected Discharge Plan:  Home w Home Health Services  In-House Referral:     Discharge planning Services  CM Consult  Post Acute Care Choice:    Choice offered to:     DME Arranged:    DME Agency:     HH Arranged:    HH Agency:     Status of Service:     Medicare Important Message Given:    Date Medicare IM Given:    Medicare IM give by:    Date Additional Medicare IM Given:    Additional Medicare Important Message give by:     If discussed at Long Length of Stay Meetings, dates discussed:    Additional Comments: ur review done, will follow for dc needs as pt progresses.  Hanley Hays, RN 11/11/2014, 2:38 PM

## 2014-11-11 NOTE — Progress Notes (Signed)
  K 6.6 hemolyzed. Repeat stat BMET now.   Stop lisinopril.   Denesia Donelan 3:25 PM

## 2014-11-12 ENCOUNTER — Encounter (HOSPITAL_COMMUNITY): Payer: Medicare Other

## 2014-11-12 ENCOUNTER — Inpatient Hospital Stay (HOSPITAL_COMMUNITY): Payer: Medicare Other

## 2014-11-12 DIAGNOSIS — I5043 Acute on chronic combined systolic (congestive) and diastolic (congestive) heart failure: Secondary | ICD-10-CM

## 2014-11-12 LAB — CARBOXYHEMOGLOBIN
CARBOXYHEMOGLOBIN: 0.9 % (ref 0.5–1.5)
Carboxyhemoglobin: 1.4 % (ref 0.5–1.5)
METHEMOGLOBIN: 1 % (ref 0.0–1.5)
METHEMOGLOBIN: 1.2 % (ref 0.0–1.5)
O2 SAT: 48.1 %
O2 Saturation: 75 %
TOTAL HEMOGLOBIN: 13.1 g/dL (ref 12.0–16.0)
TOTAL HEMOGLOBIN: 13.5 g/dL (ref 12.0–16.0)

## 2014-11-12 LAB — BASIC METABOLIC PANEL
ANION GAP: 13 (ref 5–15)
BUN: 20 mg/dL (ref 6–20)
CHLORIDE: 102 mmol/L (ref 101–111)
CO2: 24 mmol/L (ref 22–32)
Calcium: 9 mg/dL (ref 8.9–10.3)
Creatinine, Ser: 1.21 mg/dL — ABNORMAL HIGH (ref 0.44–1.00)
GFR, EST AFRICAN AMERICAN: 52 mL/min — AB (ref >60)
GFR, EST NON AFRICAN AMERICAN: 45 mL/min — AB (ref >60)
Glucose, Bld: 136 mg/dL — ABNORMAL HIGH (ref 65–99)
POTASSIUM: 3.7 mmol/L (ref 3.5–5.1)
SODIUM: 139 mmol/L (ref 135–145)

## 2014-11-12 MED ORDER — POTASSIUM CHLORIDE CRYS ER 20 MEQ PO TBCR
40.0000 meq | EXTENDED_RELEASE_TABLET | Freq: Two times a day (BID) | ORAL | Status: DC
  Administered 2014-11-12 – 2014-11-14 (×6): 40 meq via ORAL
  Filled 2014-11-12 (×10): qty 2

## 2014-11-12 MED ORDER — CARVEDILOL 3.125 MG PO TABS
3.1250 mg | ORAL_TABLET | Freq: Two times a day (BID) | ORAL | Status: DC
  Administered 2014-11-12 – 2014-11-15 (×8): 3.125 mg via ORAL
  Filled 2014-11-12 (×8): qty 1

## 2014-11-12 MED ORDER — TRAMADOL HCL 50 MG PO TABS
50.0000 mg | ORAL_TABLET | Freq: Two times a day (BID) | ORAL | Status: DC | PRN
  Filled 2014-11-12: qty 1

## 2014-11-12 MED ORDER — LISINOPRIL 2.5 MG PO TABS
2.5000 mg | ORAL_TABLET | Freq: Two times a day (BID) | ORAL | Status: DC
  Administered 2014-11-12 – 2014-11-15 (×7): 2.5 mg via ORAL
  Filled 2014-11-12 (×10): qty 1

## 2014-11-12 MED ORDER — SPIRONOLACTONE 12.5 MG HALF TABLET
12.5000 mg | ORAL_TABLET | Freq: Every day | ORAL | Status: DC
  Administered 2014-11-12 – 2014-11-15 (×4): 12.5 mg via ORAL
  Filled 2014-11-12 (×4): qty 1

## 2014-11-12 MED ORDER — TORSEMIDE 20 MG PO TABS
20.0000 mg | ORAL_TABLET | Freq: Two times a day (BID) | ORAL | Status: DC
  Administered 2014-11-12 – 2014-11-15 (×7): 20 mg via ORAL
  Filled 2014-11-12 (×9): qty 1

## 2014-11-12 MED ORDER — GADOBENATE DIMEGLUMINE 529 MG/ML IV SOLN
15.0000 mL | Freq: Once | INTRAVENOUS | Status: AC | PRN
  Administered 2014-11-12: 15 mL via INTRAVENOUS

## 2014-11-12 MED ORDER — MILRINONE IN DEXTROSE 20 MG/100ML IV SOLN
0.2500 ug/kg/min | INTRAVENOUS | Status: DC
  Filled 2014-11-12: qty 100

## 2014-11-12 NOTE — Progress Notes (Signed)
At 1240 pt arrived to floor via stretch from [facility].  Oriented to room.  Call bell at bed side.  Instructed to call for assistance.  Verbalized understanding.  Will continue to monitor.  Khalise Billard,RN.

## 2014-11-12 NOTE — Progress Notes (Addendum)
Advanced Heart Failure Rounding Note   Subjective:    Admitted with marked volume overload. Started on lasix drip 10 mg per hour. Initial CO-OX 71%. PICC placed.   Today CO-OX down to 48%. Brisk diuresis noted over night. Weight down 10 pounds. Denies SOB.  CO-OX 48% --> repeat now   ECHO - Pericardial effusion slightly smaller, EF 10-15%.   Objective:   Weight Range:  Vital Signs:   Temp:  [97.4 F (36.3 C)-98.3 F (36.8 C)] 98 F (36.7 C) (08/19 0400) Pulse Rate:  [60-88] 60 (08/19 0700) Resp:  [14-24] 15 (08/19 0700) BP: (122-150)/(74-110) 131/86 mmHg (08/19 0600) SpO2:  [97 %-100 %] 100 % (08/19 0700) Weight:  [100 lb 11.2 oz (45.677 kg)-110 lb (49.896 kg)] 100 lb 11.2 oz (45.677 kg) (08/19 0500)    Weight change: Filed Weights   11/11/14 1400 11/12/14 0500  Weight: 110 lb (49.896 kg) 100 lb 11.2 oz (45.677 kg)    Intake/Output:   Intake/Output Summary (Last 24 hours) at 11/12/14 0728 Last data filed at 11/12/14 0700  Gross per 24 hour  Intake    570 ml  Output   4540 ml  Net  -3970 ml     Physical Exam: CVP 2-3  General:  Well appearing. No resp difficulty HEENT: normal Neck: supple. JVP to 5-6. Carotids 2+ bilat; no bruits. No lymphadenopathy or thryomegaly appreciated. Cor: PMI nondisplaced. Regular rate & rhythm. No rubs, gallops or murmurs. Lungs: clear Abdomen: soft, nontender, nondistended. No hepatosplenomegaly. No bruits or masses. Good bowel sounds. Extremities: no cyanosis, clubbing, rash, R and LLE trace  Edema RUE PICC  Neuro: alert & orientedx3, cranial nerves grossly intact. moves all 4 extremities w/o difficulty. Affect pleasant GU: Foley.   Telemetry: SR 60s   Labs: Basic Metabolic Panel:  Recent Labs Lab 11/11/14 1345 11/11/14 1615 11/12/14 0255  NA 141 142 139  K 6.6* 4.6 3.7  CL 111 110 102  CO2 20* 23 24  GLUCOSE 117* 109* 136*  BUN 21* 20 20  CREATININE 1.08* 1.11* 1.21*  CALCIUM 9.3 9.1 9.0    Liver Function  Tests: No results for input(s): AST, ALT, ALKPHOS, BILITOT, PROT, ALBUMIN in the last 168 hours. No results for input(s): LIPASE, AMYLASE in the last 168 hours. No results for input(s): AMMONIA in the last 168 hours.  CBC:  Recent Labs Lab 11/11/14 1345  WBC 9.0  HGB 15.1*  HCT 44.7  MCV 86.6  PLT PLATELET CLUMPS NOTED ON SMEAR, COUNT APPEARS DECREASED    Cardiac Enzymes: No results for input(s): CKTOTAL, CKMB, CKMBINDEX, TROPONINI in the last 168 hours.  BNP: BNP (last 3 results)  Recent Labs  10/21/14 1030 10/27/14 1400 11/11/14 1345  BNP 4365.4* 3613.4* 3454.9*    ProBNP (last 3 results) No results for input(s): PROBNP in the last 8760 hours.    Other results:  Imaging: Dg Chest Port 1 View  11/11/2014   CLINICAL DATA:  PICC line placement.  EXAM: PORTABLE CHEST - 1 VIEW  COMPARISON:  Earlier film, same date.  FINDINGS: The right PICC line tip is in the distal SVC likely at the cavoatrial junction. The heart remains enlarged. Persistent pleural effusions and bibasilar atelectasis.  IMPRESSION: Right PICC line tip in good position in the distal SVC near the cavoatrial junction.  Stable cardiac enlargement and bilateral pleural effusions.   Electronically Signed   By: Rudie Meyer M.D.   On: 11/11/2014 19:14   Dg Chest Port 1 View  11/11/2014  CLINICAL DATA:  Shortness of Breath  EXAM: PORTABLE CHEST - 1 VIEW  COMPARISON:  Aug 16, 2014  FINDINGS: There is cardiac enlargement with splaying of the carina suggesting pericardial effusion. There are bilateral pleural effusions. There is consolidation in the right base. The lungs elsewhere clear except for a small granuloma in the left upper lobe. Pulmonary vascularity is within normal limits. No adenopathy.  IMPRESSION: Cardiomegaly with questionable pericardial effusion. Bilateral effusions. Suspect a degree of congestive heart failure. No edema seen, however. Airspace consolidation right base. Question alveolar edema versus  compressive atelectasis ; a degree of pneumonia in this area superimposed is also possible. Small calcified granuloma left upper lobe.   Electronically Signed   By: Bretta Bang III M.D.   On: 11/11/2014 16:44      Medications:     Scheduled Medications: . amiodarone  200 mg Oral Daily  . apixaban  5 mg Oral BID  . carvedilol  3.125 mg Oral BID WC  . digoxin  0.125 mg Oral Daily  . sodium chloride  10-40 mL Intracatheter Q12H  . sodium chloride  3 mL Intravenous Q12H     Infusions: . furosemide (LASIX) infusion 10 mg/hr (11/11/14 1618)     PRN Medications:  sodium chloride, acetaminophen, ondansetron (ZOFRAN) IV, sodium chloride, sodium chloride   Assessment/Plan     1. Acute/Chronic systolic CHF: Nonischemic cardiomyopathy. LHC/RHC (4/16) with no significant coronary disease; mean RA 5, PA 28/11, mean PCWP 9, CI 3.39. Echo (4/16): EF 20% with diffuse hypokinesis, moderate RV systolic dysfunction, large pericardial effusion. No history of drug or ETOH abuse. No family history of cardiomyopathy. Echo (6/16) with EF 15-20%, restrictive diastolic function, moderate MR, moderately decreased RV systolic function, PA systolic pressure 49 mmHg, moderate-large pericardial effusion without tamponade. Echo (7/16) with EF 10-15%, mild LV dilation, diffuse hypokinesis, restrictive diastolic dysfunction, moderate MR, moderate to severely decreased RV systolic function, moderate TR, PA systolic pressure 51 mmHg, moderate to large pericardial effusion, no change from 6/16. Admitted with marked volume overload in the setting of medication noncompliance.  Brisk diuresis over night on lasix drip.  Weight down 10 pounds. - CO-OX 48%. Repeat CO-OX . Give 0.125 mg dig today. Hold off on milrinone. BB on hold until repeat CO-OX. If ok will restart carvedilol  - Stop lasix drip. Transition to torsemide 20 mg twice a day.  - Renal function stable. Restart lisinopril 2.5 mg twice a day.  - Add  12.5 mg spiro daily.  2. Pericardial effusion: Large on 4/16 echo. Patient had pericardial window for diagnostic and therapeutic purposes. Cytology negative for malignancy. 6/16 echo with recurrence of moderate-large pericardial effusion without tamponade, similar repeat echo in 7/16. Yesterday ECHO slightly smaller effusion noted.  3. Atrial fibrillation: Atrial fibrillation with RVR in past, converted to NSR with amiodarone and remains in NSR on Eliquis.  4. HTN 5. Noncompliance 6. Hypokalemia- Supplemement K   Consult cardiac rehab.   Disposition: Home 24-48 hours.   Length of Stay: 1   CLEGG,AMY NP-C  11/12/2014, 7:28 AM  Advanced Heart Failure Team Pager [phone] (M-F; 7a - 4p)  Please contact [facility] for night-coverage after hours (4p -7a ) and weekends on [email or web address]  Patient seen with NP, agree with the above note.  She was admitted with severe volume overload in the setting of noncompliance with her diuretic.  She diuresed very well yesterday and weight down considerably.  CVP has normalized.  Repeat co-ox this morning is 75%.  -  Stop Lasix gtt, will use torsemide at home, 20 mg bid.   - She has been started on digoxin, will restart lisinopril 2.5 mg bid and Coreg 3.125 mg bid.  - Add spironolactone 12.5 mg daily.  - Repeat echo showed continued presence of substantial pericardial effusion.  She is s/p pericardial window and says that she would not do this again.  Will attempt to manage with diuresis.  - I have been concerned for infiltrative disease.  Will go ahead and do her cardiac MRI while she is in the hospital this admission.  - She remains in NSR on amiodarone, continue.  - IVCD, repeat ECG today to follow.  ?CRT benefit.  Will need to see compliance with medications prior to placement of CRT-D device, however.   35 minutes critical care time.   [name] 11/12/2014 8:10 AM

## 2014-11-12 NOTE — Care Management Note (Signed)
Case Management Note  Patient Details  Name: Katrina Peck MRN: [id number] Date of Birth: 23-Jul-1945  Subjective/Objective:         Admitted with Heart Failure           Action/Plan: Patient lives at home with spouse, remains active but gets SOB at times. She has a walker at home but does not use it. Patient has medical insurance with Medicare and [place] with prescription drug coverage and states that she does not have any problem getting her medication; Pharmacy of choice is [facility]. ( She also has [employer] but states " I use it when I need it). She has scales at home and weighs herself daily. Recently completed Outpatient Therapy; Patient could benefit from a Disease Management Program with [facility] for ongoing teaching/ education. HHC choices offered. Patient is deciding which HHC agency to use at this time. CM will continue to follow for DCP.  Expected Discharge Date:  11/15/14               Expected Discharge Plan:  Home w Home Health Services    Discharge planning Services  CM Consult Choice offered to:  Patient  HH Arranged:  Disease Management HH Agency:   TBD  Status of Service:  In process, will continue to follow  Reola Mosher [phone] 11/12/2014, 2:12 PM

## 2014-11-13 DIAGNOSIS — I429 Cardiomyopathy, unspecified: Secondary | ICD-10-CM

## 2014-11-13 LAB — BASIC METABOLIC PANEL
ANION GAP: 9 (ref 5–15)
BUN: 22 mg/dL — ABNORMAL HIGH (ref 6–20)
CALCIUM: 8.6 mg/dL — AB (ref 8.9–10.3)
CO2: 31 mmol/L (ref 22–32)
Chloride: 100 mmol/L — ABNORMAL LOW (ref 101–111)
Creatinine, Ser: 1.05 mg/dL — ABNORMAL HIGH (ref 0.44–1.00)
GFR, EST NON AFRICAN AMERICAN: 53 mL/min — AB (ref >60)
Glucose, Bld: 99 mg/dL (ref 65–99)
Potassium: 4.8 mmol/L (ref 3.5–5.1)
SODIUM: 140 mmol/L (ref 135–145)

## 2014-11-13 NOTE — Progress Notes (Signed)
Subjective:    Feels great no dyspnea tired of going to bathroom from lasix   Objective:   Weight Range:  Vital Signs:   Temp:  [97.5 F (36.4 C)-97.7 F (36.5 C)] 97.5 F (36.4 C) (08/20 0703) Pulse Rate:  [72-76] 72 (08/20 0703) Resp:  [16-18] 18 (08/20 0703) BP: (93-119)/(61-80) 93/61 mmHg (08/20 0703) SpO2:  [99 %-100 %] 99 % (08/20 0703) Weight:  [42.457 kg (93 lb 9.6 oz)-43.817 kg (96 lb 9.6 oz)] 42.457 kg (93 lb 9.6 oz) (08/20 0703)    Weight change: Filed Weights   11/12/14 0500 11/12/14 1248 11/13/14 0703  Weight: 45.677 kg (100 lb 11.2 oz) 43.817 kg (96 lb 9.6 oz) 42.457 kg (93 lb 9.6 oz)    Intake/Output:   Intake/Output Summary (Last 24 hours) at 11/13/14 1007 Last data filed at 11/13/14 0704  Gross per 24 hour  Intake    360 ml  Output   2450 ml  Net  -2090 ml     Physical Exam: CVP 2-3  General:  Chronically ill thin black female  HEENT: normal Neck: supple. JVP to 5-6. Carotids 2+ bilat; no bruits. No lymphadenopathy or thryomegaly appreciated. Cor: PMI nondisplaced. Regular rate & rhythm. No rubs, gallops or murmurs. Lungs: clear Abdomen: soft, nontender, nondistended. No hepatosplenomegaly. No bruits or masses. Good bowel sounds. Extremities: no cyanosis, clubbing, rash, R and LLE trace  Edema RUE PICC  Neuro: alert & orientedx3, cranial nerves grossly intact. moves all 4 extremities w/o difficulty. Affect pleasant GU: Foley.   Telemetry: SR 60s   Labs: Basic Metabolic Panel:  Recent Labs Lab 11/11/14 1345 11/11/14 1615 11/12/14 0255 11/13/14 0510  NA 141 142 139 140  K 6.6* 4.6 3.7 4.8  CL 111 110 102 100*  CO2 20* 23 24 31   GLUCOSE 117* 109* 136* 99  BUN 21* 20 20 22*  CREATININE 1.08* 1.11* 1.21* 1.05*  CALCIUM 9.3 9.1 9.0 8.6*     CBC:  Recent Labs Lab 11/11/14 1345  WBC 9.0  HGB 15.1*  HCT 44.7  MCV 86.6  PLT PLATELET CLUMPS NOTED ON SMEAR, COUNT APPEARS DECREASED     BNP: BNP (last 3 results)  Recent  Labs  10/21/14 1030 10/27/14 1400 11/11/14 1345  BNP 4365.4* 3613.4* 3454.9*       Other results:  Imaging: Dg Chest Port 1 View  11/11/2014   CLINICAL DATA:  PICC line placement.  EXAM: PORTABLE CHEST - 1 VIEW  COMPARISON:  Earlier film, same date.  FINDINGS: The right PICC line tip is in the distal SVC likely at the cavoatrial junction. The heart remains enlarged. Persistent pleural effusions and bibasilar atelectasis.  IMPRESSION: Right PICC line tip in good position in the distal SVC near the cavoatrial junction.  Stable cardiac enlargement and bilateral pleural effusions.   Electronically Signed   By: Rudie Meyer M.D.   On: 11/11/2014 19:14   Dg Chest Port 1 View  11/11/2014   CLINICAL DATA:  Shortness of Breath  EXAM: PORTABLE CHEST - 1 VIEW  COMPARISON:  Aug 16, 2014  FINDINGS: There is cardiac enlargement with splaying of the carina suggesting pericardial effusion. There are bilateral pleural effusions. There is consolidation in the right base. The lungs elsewhere clear except for a small granuloma in the left upper lobe. Pulmonary vascularity is within normal limits. No adenopathy.  IMPRESSION: Cardiomegaly with questionable pericardial effusion. Bilateral effusions. Suspect a degree of congestive heart failure. No edema seen, however. Airspace consolidation right  base. Question alveolar edema versus compressive atelectasis ; a degree of pneumonia in this area superimposed is also possible. Small calcified granuloma left upper lobe.   Electronically Signed   By: Bretta Bang III M.D.   On: 11/11/2014 16:44     Medications:     Scheduled Medications: . amiodarone  200 mg Oral Daily  . apixaban  5 mg Oral BID  . carvedilol  3.125 mg Oral BID WC  . digoxin  0.125 mg Oral Daily  . lisinopril  2.5 mg Oral BID  . potassium chloride  40 mEq Oral BID  . sodium chloride  10-40 mL Intracatheter Q12H  . sodium chloride  3 mL Intravenous Q12H  . spironolactone  12.5 mg Oral  Daily  . torsemide  20 mg Oral BID    Infusions:    PRN Medications: sodium chloride, acetaminophen, ondansetron (ZOFRAN) IV, sodium chloride, sodium chloride, traMADol   Assessment/Plan     1. Acute/Chronic systolic CHF: Nonischemic cardiomyopathy. LHC/RHC (4/16) with no significant coronary disease; mean RA 5, PA 28/11, mean PCWP 9, CI 3.39. Echo (4/16): EF 20% with diffuse hypokinesis, moderate RV systolic dysfunction, large pericardial effusion. No history of drug or ETOH abuse. No family history of cardiomyopathy. Echo (6/16) with EF 15-20%, restrictive diastolic function, moderate MR, moderately decreased RV systolic function, PA systolic pressure 49 mmHg, moderate-large pericardial effusion without tamponade. Echo (7/16) with EF 10-15%, mild LV dilation, diffuse hypokinesis, restrictive diastolic dysfunction, moderate MR, moderate to severely decreased RV systolic function, moderate TR, PA systolic pressure 51 mmHg, moderate to large pericardial effusion, no change from 6/16. Admitted with marked volume overload in the setting of medication noncompliance.  Weight down over 2L diuresis last 24 hrs.  Continue current PO meds.  Stressed compliance.  May be ready for d/c Monday 2. Pericardial effusion: Large on 4/16 echo. Patient had pericardial window for diagnostic and therapeutic purposes. Cytology negative for malignancy. 6/16 echo with recurrence of moderate-large pericardial effusion without tamponade, similar repeat echo in 7/16. Yesterday ECHO slightly smaller effusion noted.  3. Atrial fibrillation: Atrial fibrillation with RVR in past, converted to NSR with amiodarone and remains in NSR on Eliquis.  4. HTN 5. Noncompliance 6. Hypokalemia- Supplemement K   Consult cardiac rehab.   Disposition: Home 24-48 hours.   Length of Stay: 2   Charlton Haws NP-C  11/13/2014, 10:07 AM

## 2014-11-13 NOTE — Discharge Instructions (Signed)

## 2014-11-14 LAB — BASIC METABOLIC PANEL
ANION GAP: 6 (ref 5–15)
BUN: 21 mg/dL — ABNORMAL HIGH (ref 6–20)
CALCIUM: 8.4 mg/dL — AB (ref 8.9–10.3)
CO2: 29 mmol/L (ref 22–32)
Chloride: 100 mmol/L — ABNORMAL LOW (ref 101–111)
Creatinine, Ser: 0.91 mg/dL (ref 0.44–1.00)
GFR calc Af Amer: >60 mL/min (ref >60)
GFR calc non Af Amer: >60 mL/min (ref >60)
GLUCOSE: 98 mg/dL (ref 65–99)
Potassium: 4.7 mmol/L (ref 3.5–5.1)
Sodium: 135 mmol/L (ref 135–145)

## 2014-11-14 LAB — CARBOXYHEMOGLOBIN
CARBOXYHEMOGLOBIN: 1.2 % (ref 0.5–1.5)
Methemoglobin: 0.9 % (ref 0.0–1.5)
O2 SAT: 60.8 %
Total hemoglobin: 13.5 g/dL (ref 12.0–16.0)

## 2014-11-14 NOTE — Progress Notes (Signed)
Subjective: No complaints, pt up bathing, no dizziness  Objective: Vital signs in last 24 hours: Temp:  [97.5 F (36.4 C)-97.9 F (36.6 C)] 97.5 F (36.4 C) (08/21 0546) Pulse Rate:  [60-73] 60 (08/21 0546) Resp:  [14-18] 14 (08/21 0546) BP: (81-102)/(51-64) 102/64 mmHg (08/21 0546) SpO2:  [98 %-100 %] 98 % (08/21 0546) Weight:  [97 lb 4.8 oz (44.135 kg)] 97 lb 4.8 oz (44.135 kg) (08/21 0546) Weight change: -3 lb (-1.361 kg)   Intake/Output from previous day: - 1440 though wt up 4 lbs? 08/20 0701 - 08/21 0700 In: 960 [P.O.:960] Out: 2400 [Urine:2400] Intake/Output this shift:    PE: General:Pleasant affect, NAD Skin:Warm and dry, brisk capillary refill HEENT:normocephalic, sclera clear, mucus membranes moist Heart:S1S2 RRR without murmur, gallup, rub or click Lungs:clear without rales, rhonchi, or wheezes DBZ:MCEY, non tender, + BS, do not palpate liver spleen or masses Ext:no lower ext edema, 2+ pedal pulses, 2+ radial pulses Neuro:alert and oriented X 3, MAE, follows commands, + facial symmetry Tele:  SR  Lab Results:  Recent Labs  11/11/14 1345  WBC 9.0  HGB 15.1*  HCT 44.7  PLT PLATELET CLUMPS NOTED ON SMEAR, COUNT APPEARS DECREASED   BMET  Recent Labs  11/13/14 0510 11/14/14 0525  NA 140 135  K 4.8 4.7  CL 100* 100*  CO2 31 29  GLUCOSE 99 98  BUN 22* 21*  CREATININE 1.05* 0.91  CALCIUM 8.6* 8.4*   No results for input(s): TROPONINI in the last 72 hours.  Invalid input(s): CK, MB  No results found for: CHOL, HDL, LDLCALC, LDLDIRECT, TRIG, CHOLHDL No results found for: EMVV6P   Lab Results  Component Value Date   TSH 5.673* 11/11/2014       Studies/Results: Mr Card Morphology Wo/w Cm  11/13/2014   CLINICAL DATA:  Cardiomyopathy of uncertain etiology  EXAM: CARDIAC MRI  TECHNIQUE: The patient was scanned on a 1.5 Tesla GE magnet. A dedicated cardiac coil was used. Functional imaging was done using Fiesta sequences. 2,3, and 4  chamber views were done to assess for RWMA's. Modified Simpson's rule using a short axis stack was used to calculate an ejection fraction on a dedicated work [employer]. The patient received 15 cc of Multihance. After 10 minutes inversion recovery sequences were used to assess for infiltration and scar tissue.  CONTRAST:  15 cc Multihance  FINDINGS: There was a moderate right-sided pleural effusion. There was a moderately to large circumferential pericardial effusion. No right atrial collapse. No right ventricular diastolic collapse or indentation.  Mildly dilated left ventricle with severe diffuse hypokinesis, EF 11%. Normal wall thickness. Mildly dilated RV with moderately decreased systolic function, EF 30%. Moderate left atrial enlargement. Mild right atrial enlargement. Moderat mitral regurgitation. Moderate tricuspid regurgitation. No significant aortic regurgitation or stenosis.  On delayed enhancement imaging, there appeared to be patchy mid-wall late gadolinium enhancement (LGE) in the basal to mid septum.  MEASUREMENTS: MEASUREMENTS LV EDV 301 mL  LV SV 32 mL  LV EF 11%  RV EF 30%  IMPRESSION: 1. Moderate to large pericardial effusion without definite evidence for pericardial tamponade.  2.  Mildly dilated LV with EF 11%, diffuse hypokinesis.  3.  Mildly dilated RV with EF 30% (moderate systolic dysfunction).  4. Patchy mid-wall LGE in the basal to mid septum. This suggests possibility of prior myocarditis (or a form of infiltrative disease).  [employer]   Electronically Signed   By:  [name] M.D.   On: 11/13/2014 14:31    Medications: I have reviewed the patient's current medications. Scheduled Meds: . amiodarone  200 mg Oral Daily  . apixaban  5 mg Oral BID  . carvedilol  3.125 mg Oral BID WC  . digoxin  0.125 mg Oral Daily  . lisinopril  2.5 mg Oral BID  . potassium chloride  40 mEq Oral BID  . sodium chloride  10-40 mL Intracatheter Q12H  . sodium chloride  3 mL  Intravenous Q12H  . spironolactone  12.5 mg Oral Daily  . torsemide  20 mg Oral BID   Continuous Infusions:  PRN Meds:.sodium chloride, acetaminophen, ondansetron (ZOFRAN) IV, sodium chloride, sodium chloride, traMADol  Assessment/Plan:  1. Acute/Chronic systolic CHF: Nonischemic cardiomyopathy. LHC/RHC (4/16) with no significant coronary disease; mean RA 5, PA 28/11, mean PCWP 9, CI 3.39. Echo (4/16): EF 20% with diffuse hypokinesis, moderate RV systolic dysfunction, large pericardial effusion. No history of drug or ETOH abuse. No family history of cardiomyopathy. Echo (6/16) with EF 15-20%, restrictive diastolic function, moderate MR, moderately decreased RV systolic function, PA systolic pressure 49 mmHg, moderate-large pericardial effusion without tamponade. Echo (7/16) with EF 10-15%, mild LV dilation, diffuse hypokinesis, restrictive diastolic dysfunction, moderate MR, moderate to severely decreased RV systolic function, moderate TR, PA systolic pressure 51 mmHg, moderate to large pericardial effusion, no change from 6/16. Admitted with marked volume overload in the setting of medication noncompliance. Weight down was 93 lbs from 110 but now wt up 4 lbs.   and 7L diuresis since admit Continue current PO meds. Stressed compliance. May be ready for d/c Monday  2. Pericardial effusion: Large on 4/16 echo. Patient had pericardial window for diagnostic and therapeutic purposes. Cytology negative for malignancy. 6/16 echo with recurrence of moderate-large pericardial effusion without tamponade, similar repeat echo in 7/16. 11/11/14 ECHO slightly smaller effusion noted.   3. Atrial fibrillation: Atrial fibrillation with RVR in past, converted to NSR with amiodarone and remains in NSR on Eliquis.   4. HTN now with HYPOTENSION  81/58 to 91/57 decrease tosemide ?  Cr is stable - asymptomatic now outpt she had been on furosemide and she stopped taking because she felt "funny in the head" ?  Hypotensive. Will hold torsemide now until MD sees.  5. Noncompliance  6. Hypokalemia- Supplemement K   7. TSH elevated on amiodarone check free T4 was normal in April/16    LOS: 3 days   Time spent with pt. :15 minutes. [facility]  Nurse Practitioner Certified Pager [phone] or after 5pm and on weekends call [phone] 11/14/2014, 8:46 AM   Patient examined chart reviewed.  Should be ready for d.c in am. Lungs clear Discussed noncompliance issues with her but there seems to be little comprehension No LE edema  Continue current meds  Charlton Haws

## 2014-11-15 ENCOUNTER — Encounter (HOSPITAL_COMMUNITY): Payer: Medicare Other

## 2014-11-15 DIAGNOSIS — I319 Disease of pericardium, unspecified: Secondary | ICD-10-CM

## 2014-11-15 LAB — BASIC METABOLIC PANEL
ANION GAP: 4 — AB (ref 5–15)
BUN: 20 mg/dL (ref 6–20)
CALCIUM: 8.3 mg/dL — AB (ref 8.9–10.3)
CO2: 27 mmol/L (ref 22–32)
Chloride: 104 mmol/L (ref 101–111)
Creatinine, Ser: 0.83 mg/dL (ref 0.44–1.00)
Glucose, Bld: 95 mg/dL (ref 65–99)
Potassium: 5 mmol/L (ref 3.5–5.1)
Sodium: 135 mmol/L (ref 135–145)

## 2014-11-15 LAB — CARBOXYHEMOGLOBIN
CARBOXYHEMOGLOBIN: 0.9 % (ref 0.5–1.5)
Methemoglobin: 0.8 % (ref 0.0–1.5)
O2 Saturation: 61.3 %
Total hemoglobin: 13.1 g/dL (ref 12.0–16.0)

## 2014-11-15 MED ORDER — SPIRONOLACTONE 25 MG PO TABS: 12.5000 mg | ORAL_TABLET | Freq: Every day | ORAL | Status: DC

## 2014-11-15 MED ORDER — DIGOXIN 125 MCG PO TABS: 0.1250 mg | ORAL_TABLET | Freq: Every day | ORAL | Status: DC

## 2014-11-15 MED ORDER — TORSEMIDE 20 MG PO TABS: 20.0000 mg | ORAL_TABLET | Freq: Two times a day (BID) | ORAL | Status: DC

## 2014-11-15 MED ORDER — POTASSIUM CHLORIDE CRYS ER 20 MEQ PO TBCR
20.0000 meq | EXTENDED_RELEASE_TABLET | Freq: Two times a day (BID) | ORAL | Status: DC
  Administered 2014-11-15: 20 meq via ORAL

## 2014-11-15 NOTE — Progress Notes (Signed)
rge home with spouse . Wheeled to lobby by Nt

## 2014-11-15 NOTE — Progress Notes (Signed)
Subjective: Denies SOB/dizziness   Objective: Vital signs in last 24 hours: Temp:  [97.5 F (36.4 C)-98.7 F (37.1 C)] 97.5 F (36.4 C) (08/22 0504) Pulse Rate:  [62-73] 62 (08/22 0504) Resp:  [16-18] 16 (08/22 0504) BP: (102-117)/(57-77) 102/63 mmHg (08/22 0504) SpO2:  [100 %] 100 % (08/22 0504) Weight:  [96 lb 1.9 oz (43.6 kg)] 96 lb 1.9 oz (43.6 kg) (08/22 0504) Weight change: 2 lb 8.3 oz (1.143 kg)   08/21 0701 - 08/22 0700 In: 980 [P.O.:960; I.V.:20] Out: 2101 [Urine:2100; Stool:1] Intake/Output this shift:    PE: General:Pleasant affect, NAD. Sitting on the side of the bed.  Skin:Warm and dry, brisk capillary refill HEENT:normocephalic, sclera clear, mucus membranes moist Heart:S1S2 RRR without murmur, gallup, rub or click Lungs:clear without rales, rhonchi, or wheezes EHM:CNOB, non tender, + BS, do not palpate liver spleen or masses Ext:no lower ext edema, 2+ pedal pulses, 2+ radial pulses Neuro:alert and oriented X 3, MAE, follows commands, + facial symmetry Tele:  SR 60s Lab Results: No results for input(s): WBC, HGB, HCT, PLT in the last 72 hours. BMET  Recent Labs  11/14/14 0525 11/15/14 0438  NA 135 135  K 4.7 5.0  CL 100* 104  CO2 29 27  GLUCOSE 98 95  BUN 21* 20  CREATININE 0.91 0.83  CALCIUM 8.4* 8.3*   No results for input(s): TROPONINI in the last 72 hours.  Invalid input(s): CK, MB  No results found for: CHOL, HDL, LDLCALC, LDLDIRECT, TRIG, CHOLHDL No results found for: [id number]   Lab Results  Component Value Date   TSH 5.673* 11/11/2014       Studies/Results: No results found.  Medications: I have reviewed the patient's current medications. Scheduled Meds: . amiodarone  200 mg Oral Daily  . apixaban  5 mg Oral BID  . carvedilol  3.125 mg Oral BID WC  . digoxin  0.125 mg Oral Daily  . lisinopril  2.5 mg Oral BID  . potassium chloride  40 mEq Oral BID  . sodium chloride  10-40 mL Intracatheter Q12H  . sodium  chloride  3 mL Intravenous Q12H  . spironolactone  12.5 mg Oral Daily  . torsemide  20 mg Oral BID   Continuous Infusions:  PRN Meds:.sodium chloride, acetaminophen, ondansetron (ZOFRAN) IV, sodium chloride, sodium chloride, traMADol  Assessment/Plan:  1. Acute/Chronic systolic CHF: Nonischemic cardiomyopathy. LHC/RHC (4/16) with no significant coronary disease; mean RA 5, PA 28/11, mean PCWP 9, CI 3.39. Echo (4/16): EF 20% with diffuse hypokinesis, moderate RV systolic dysfunction, large pericardial effusion. No history of drug or ETOH abuse. No family history of cardiomyopathy. Echo (6/16) with EF 15-20%, restrictive diastolic function, moderate MR, moderately decreased RV systolic function, PA systolic pressure 49 mmHg, moderate-large pericardial effusion without tamponade. Echo (7/16) with EF 10-15%, mild LV dilation, diffuse hypokinesis, restrictive diastolic dysfunction, moderate MR, moderate to severely decreased RV systolic function, moderate TR, PA systolic pressure 51 mmHg, moderate to large pericardial effusion, no change from 6/16. Admitted with marked volume overload in the setting of medication noncompliance. Diuresed with lasix drip and transitioned to torsemide. Overall she has diuresed 14 pounds. Continue current dose of carvedilol, digoxin, lisinopril.   2. Pericardial effusion: Large on 4/16 echo. Patient had pericardial window for diagnostic and therapeutic purposes. Cytology negative for malignancy. 6/16 echo with recurrence of moderate-large pericardial effusion without tamponade, similar repeat echo in 7/16. 11/11/14 ECHO slightly smaller effusion noted.  3. Atrial fibrillation: Atrial  fibrillation with RVR in past, converted to NSR with amiodarone and remains in NSR on Eliquis.  4. HTN - Stable. 5. Noncompliance 6. Hypokalemia-  K 5 today.  7. TSH elevated on amiodarone check free T4 was normal in April/16  D/C today. Has follow in HF clinic. HH to follow     LOS: 4 days  CLEGG,AMY NP-C  7:33 AM Advanced Heart Failure Team Pager [phone] (M-F; 7a - 4p)  Please contact [facility] for night-coverage after hours (4p -7a ) and weekends on [email or web address]  Patient seen with NP, agree with the above note.  She is doing well today, feels back to normal.  Weight is down considerately.  Cardiac MRI showed LV EF 11%, RVEF 30%, moderate to large pericardial effusion without evidence of tamponade, subtle mid-wall LGE basal to mid septum (?myocarditis in past).   Continue current cardiac meds at home.  Compliance stressed.  I am concerned that she is going to have a hard time going forwards.  Very poor cardiac function and difficulty with medication compliance.  She is probably going to be a poor candidate for advanced therapies.   Follow pericardial effusion for now, encouraged her to take her diuretics.    Followup in 1 week with BMET.   [name] 11/15/2014 7:48 AM

## 2014-11-15 NOTE — Progress Notes (Signed)
PICC lined removed,prssure dressing applied ,no bleeding noted at site. Cautioned patient to remain supine for 30 mins.  after PICC removed to avoid complications and pressure dressing to remain dry and intact for 24 hrs. Eliot Ford RN [facility]

## 2014-11-15 NOTE — Discharge Summary (Signed)
Advanced Heart Failure Team  Discharge Summary   Patient ID: Katrina Peck MRN: [id number], DOB/AGE: 11-11-1945 69 y.o. Admit date: 11/11/2014 D/C date:     11/15/2014   Primary Discharge Diagnoses:  1. Acute/Chronic systolic CHF: Nonischemic cardiomyopathy. LHC/RHC (4/16) with no significant coronary disease; mean RA 5, PA 28/11, mean PCWP 9, CI 3.39.  Echo (7/16) with EF 10-15%, mild LV dilation, diffuse hypokinesis, restrictive diastolic dysfunction, moderate MR, moderate to severely decreased RV systolic function, moderate TR, PA systolic pressure 51 mmHg, moderate to large pericardial effusion, no change from 6/16. 10/2014 CMRI EF 11% RVEF 30%  2. Pericardial effusion: Large on 4/16 echo. Patient had pericardial window for diagnostic and therapeutic purposes. Cytology negative for malignancy. 6/16 echo with recurrence of moderate-large pericardial effusion without tamponade, similar repeat echo in 7/16. 11/11/14 ECHO slightly smaller effusion noted.  3. Atrial fibrillation: Atrial fibrillation with RVR in past, converted to NSR with amiodarone and remains in NSR on Eliquis.  4. HTN - Stable. 5. Noncompliance 6. Hypokalemia- K 5 today.  7. TSH elevated on amiodarone check free T4 was normal in April/16  Hospital Course:     69 y/o female with history of HTN who was admitted 07/20/14 with 3 months of exertional dyspnea, weight gain, cough, and orthopnea. Found to have EF 20% and a large pericardial effusion. Cath 5/16 no CAD. Hospital course complicated by atrial fibrillation w/ RVR, converted to NSR on amiodarone. Underwent pericardial window (transudate).   She had a repeat echo in 6/16 that showed persistently low LV EF at 15-20% with recurrence of moderate to large pericardial effusion without tamponade. This showed EF 10-15%, mild LV dilation, restrictive diastolic function, moderate to severely decreased RV function, and a moderate to large pericardial effusion similar to  6/16.           Admitted from HF Clinic with marked volume overload in the setting of medication noncompliance. PICC line was placed to guide therapy.  Diuresed with lasix drip and as her volume status improved she transitioned to torsemide 20 mg twice daily. Overall she diuresed 14 pounds. She will remain on low dose bb, ace, and dig was added. Renal function remained stable.   Repeat ECHO completed that was similar to ECHO in July without evidence of tamponade but ongoing mod-larg pericardial effusion. CMRI performed and showed EF 11% , RVEF 33%, and mod-large pericardial effusion.   She remained in NSR and will continue on low dose amiodarone + eliquis.   She will continue to be followed closely in the HF clinic. She is at high risk for readmit due to noncompliance and poor insight related to HF.     Discharge Weight Range: 96 pounds  Discharge Vitals: Blood pressure 102/63, pulse 62, temperature 97.5 F (36.4 C), temperature source Oral, resp. rate 16, height 5\' 2"  (1.575 m), weight 96 lb 1.9 oz (43.6 kg), SpO2 100 %.  Labs: Lab Results  Component Value Date   WBC 9.0 11/11/2014   HGB 15.1* 11/11/2014   HCT 44.7 11/11/2014   MCV 86.6 11/11/2014   PLT  11/11/2014    PLATELET CLUMPS NOTED ON SMEAR, COUNT APPEARS DECREASED    Recent Labs Lab 11/15/14 0438  NA 135  K 5.0  CL 104  CO2 27  BUN 20  CREATININE 0.83  CALCIUM 8.3*  GLUCOSE 95   No results found for: CHOL, HDL, LDLCALC, TRIG BNP (last 3 results)  Recent Labs  10/21/14 1030 10/27/14 1400 11/11/14 1345  BNP  4365.4* 3613.4* 3454.9*    ProBNP (last 3 results) No results for input(s): PROBNP in the last 8760 hours.   Diagnostic Studies/Procedures   No results found.  Discharge Medications     Medication List    TAKE these medications        amiodarone 100 MG tablet  Commonly known as:  PACERONE  Take 2 tablets (200 mg total) by mouth daily.     apixaban 5 MG Tabs tablet  Commonly known as:   ELIQUIS  Take 1 tablet (5 mg total) by mouth 2 (two) times daily.     carvedilol 3.125 MG tablet  Commonly known as:  COREG  Take 1 tablet (3.125 mg total) by mouth 2 (two) times daily with a meal.     digoxin 0.125 MG tablet  Commonly known as:  LANOXIN  Take 1 tablet (0.125 mg total) by mouth daily.     lisinopril 2.5 MG tablet  Commonly known as:  PRINIVIL,ZESTRIL  Take 1 tablet (2.5 mg total) by mouth 2 (two) times daily.     spironolactone 25 MG tablet  Commonly known as:  ALDACTONE  Take 0.5 tablets (12.5 mg total) by mouth daily.     torsemide 20 MG tablet  Commonly known as:  DEMADEX  Take 1 tablet (20 mg total) by mouth 2 (two) times daily.        Disposition   The patient will be discharged in stable condition to home.     Discharge Instructions    ACE Inhibitor / ARB already ordered    Complete by:  As directed      Diet - low sodium heart healthy    Complete by:  As directed      Heart Failure patients record your daily weight using the same scale at the same time of day    Complete by:  As directed      Increase activity slowly    Complete by:  As directed           Follow-up Information    Follow up with Arvilla Meres, MD On 11/30/2014.   Specialty:  Cardiology   Why:  at 10:00 Garage Code [id number]   Contact information:   [address] [place] [address] [phone]         Duration of Discharge Encounter: Greater than 35 minutes   Signed, CLEGG,AMY NP_C 11/15/2014, 7:44 AM  Patient seen and examined with Tonye Becket, NP. We discussed all aspects of the encounter. I agree with the assessment and plan as stated above.   Bensimhon, Daniel,MD 2:48 PM

## 2014-11-17 ENCOUNTER — Encounter (HOSPITAL_COMMUNITY): Payer: Medicare Other

## 2014-11-19 ENCOUNTER — Ambulatory Visit (HOSPITAL_COMMUNITY): Admission: RE | Admit: 2014-11-19 | Payer: Medicare Other | Source: Ambulatory Visit

## 2014-11-19 ENCOUNTER — Encounter (HOSPITAL_COMMUNITY): Payer: Medicare Other

## 2014-11-22 ENCOUNTER — Other Ambulatory Visit (HOSPITAL_COMMUNITY): Payer: Medicare Other

## 2014-11-22 ENCOUNTER — Encounter (HOSPITAL_COMMUNITY): Payer: Medicare Other

## 2014-11-24 ENCOUNTER — Encounter (HOSPITAL_COMMUNITY): Payer: Medicare Other

## 2014-11-26 ENCOUNTER — Encounter (HOSPITAL_COMMUNITY): Payer: Medicare Other

## 2014-11-30 ENCOUNTER — Encounter (HOSPITAL_COMMUNITY): Payer: Self-pay

## 2014-11-30 ENCOUNTER — Ambulatory Visit (HOSPITAL_COMMUNITY)
Admit: 2014-11-30 | Discharge: 2014-11-30 | Disposition: A | Discharge status: Home / Self Care | Payer: Medicare Other | Source: Ambulatory Visit | Attending: Cardiology | Admitting: Cardiology

## 2014-11-30 VITALS — BP 110/66 | HR 67 | Wt 99.5 lb

## 2014-11-30 DIAGNOSIS — Z79899 Other long term (current) drug therapy: Secondary | ICD-10-CM | POA: Diagnosis not present

## 2014-11-30 DIAGNOSIS — I480 Paroxysmal atrial fibrillation: Secondary | ICD-10-CM

## 2014-11-30 DIAGNOSIS — I5022 Chronic systolic (congestive) heart failure: Secondary | ICD-10-CM | POA: Insufficient documentation

## 2014-11-30 DIAGNOSIS — I10 Essential (primary) hypertension: Secondary | ICD-10-CM | POA: Insufficient documentation

## 2014-11-30 DIAGNOSIS — I319 Disease of pericardium, unspecified: Secondary | ICD-10-CM

## 2014-11-30 DIAGNOSIS — I428 Other cardiomyopathies: Secondary | ICD-10-CM | POA: Diagnosis not present

## 2014-11-30 DIAGNOSIS — Z9114 Patient's other noncompliance with medication regimen: Secondary | ICD-10-CM | POA: Diagnosis not present

## 2014-11-30 DIAGNOSIS — I313 Pericardial effusion (noninflammatory): Secondary | ICD-10-CM

## 2014-11-30 DIAGNOSIS — Z7902 Long term (current) use of antithrombotics/antiplatelets: Secondary | ICD-10-CM | POA: Insufficient documentation

## 2014-11-30 DIAGNOSIS — I429 Cardiomyopathy, unspecified: Secondary | ICD-10-CM | POA: Diagnosis not present

## 2014-11-30 DIAGNOSIS — I3139 Other pericardial effusion (noninflammatory): Secondary | ICD-10-CM

## 2014-11-30 LAB — BASIC METABOLIC PANEL
ANION GAP: 5 (ref 5–15)
BUN: 13 mg/dL (ref 6–20)
CALCIUM: 9.4 mg/dL (ref 8.9–10.3)
CO2: 25 mmol/L (ref 22–32)
CREATININE: 0.79 mg/dL (ref 0.44–1.00)
Chloride: 108 mmol/L (ref 101–111)
Glucose, Bld: 98 mg/dL (ref 65–99)
Potassium: 4.2 mmol/L (ref 3.5–5.1)
Sodium: 138 mmol/L (ref 135–145)

## 2014-11-30 MED ORDER — AMIODARONE HCL 100 MG PO TABS: 100.0000 mg | ORAL_TABLET | Freq: Every day | ORAL | Status: DC

## 2014-11-30 MED ORDER — TORSEMIDE 20 MG PO TABS: 20.0000 mg | ORAL_TABLET | Freq: Every day | ORAL | Status: DC

## 2014-11-30 MED ORDER — LISINOPRIL 2.5 MG PO TABS: 2.5000 mg | ORAL_TABLET | Freq: Two times a day (BID) | ORAL | Status: DC

## 2014-11-30 NOTE — Progress Notes (Addendum)
Patient ID: Katrina Peck, female   DOB: Jan 19, 1946, 69 y.o.   MRN: [id number]   ADVANCED HF CLINIC NOTE  PCP: Dr. Ernie Hew Cardiology: Dr. Aundra Dubin  69 y/o female with history of HTN who was admitted 07/20/14 with 3 months of exertional dyspnea, weight gain, cough, and orthopnea. Found to have EF 20% and a large pericardial effusion. Cath 5/16 no CAD. Hospital course complicated by atrial fibrillation w/ RVR, converted to NSR on amiodarone. Underwent pericardial window (transudate).    She had a repeat echo in 6/16 that showed persistently low LV EF at 15-20% with recurrence of moderate to large pericardial effusion without tamponade. This showed EF 10-15%, mild LV dilation, restrictive diastolic function, moderate to severely decreased RV function, and a moderate to large pericardial effusion similar to 6/16.   At last clinic visit in 8/16, she was not taking her medications and was significantly volume overloaded.  She was admitted and diuresed.  Cardiac MRI and echo showed persistent pericardial effusion without tamponade and persistently low EF.  She presents today for hospital follow up.  Overall feeling ok. Mild dyspnea with exertion. Able to sleep flat in the bed. Denies PND. Weight at home  99-103 pound. She is not taking torsemide twice a day. Only taking torsemide once a day. Not taking eliquis daily, lisinopril, carvedilol,  digoxin, or spiro. Says she just does not want to take the medications.  Labs  (5/16): K 4.6, creatinine 1.0 => 0.81, TSH normal, LFTs normal, TSH normal, BNP 1268 (7/16): K 5.3 => 5.4, creatinine 1.0 => 1.02, HCT 39.7, BNP > 4500 => 1082, SPEP negative, urine IFE negative (8/16): K 5, creatinine 0.83   SH: Married, no smoking, no ETOH.   FH: No history of cardiomyopathy or sudden death.    ROS: All systems reviewed and negative except as per HPI.   PMH: 1. Chronic systolic CHF: Nonischemic cardiomyopathy.  LHC/RHC (4/16) with no significant coronary disease; mean  RA 5, PA 28/11, mean PCWP 9, CI 3.39.  Echo (4/16): EF 20% with diffuse hypokinesis, moderate RV systolic dysfunction, large pericardial effusion.  No history of drug or ETOH abuse.  No family history of cardiomyopathy.   Echo (6/16) with EF 15-20%, restrictive diastolic function, moderate MR, moderately decreased RV systolic function, PA systolic pressure 49 mmHg, moderate-large pericardial effusion without tamponade.  Echo (7/16) with EF 10-15%, mild LV dilation, diffuse hypokinesis, restrictive diastolic dysfunction, moderate MR, moderate to severely decreased RV systolic function, moderate TR, PA systolic pressure 51 mmHg, moderate to large pericardial effusion, no change from 6/16.  Echo (8/16) with EF 10-15%, mild to moderately reduced RV systolic function, moderate pericardial effusion (slightly smaller than prior), PASP 63 mmHg.  Cardiac MRI (8/16) with LV EF 11%, RV EF 30%, mid-wall LGE involving the basal to mid septum (?myocarditis), moderate to large pericardial effusion with no evidence for tamponade.  2. Pericardial effusion: Large on 4/16 echo.  Patient had pericardial window for diagnostic and therapeutic purposes.  Cytology negative for malignancy.  6/16 echo with recurrence of moderate-large pericardial effusion without tamponade, similar repeat echo in 7/16, effusion slightly small in 8/16 by echo and MRI.  3. Atrial fibrillation: Atrial fibrillation with RVR, converted to NSR with amiodarone.   4. HTN 5. Noncompliance  Current Outpatient Prescriptions  Medication Sig Dispense Refill  . amiodarone (PACERONE) 100 MG tablet Take 2 tablets (200 mg total) by mouth daily. 30 tablet 5  . apixaban (ELIQUIS) 5 MG TABS tablet Take 1 tablet (5  mg total) by mouth 2 (two) times daily. 60 tablet 5  . carvedilol (COREG) 3.125 MG tablet Take 1 tablet (3.125 mg total) by mouth 2 (two) times daily with a meal. 60 tablet 6  . digoxin (LANOXIN) 0.125 MG tablet Take 1 tablet (0.125 mg total) by mouth  daily. 30 tablet 6  . lisinopril (PRINIVIL,ZESTRIL) 2.5 MG tablet Take 1 tablet (2.5 mg total) by mouth 2 (two) times daily. 60 tablet 5  . spironolactone (ALDACTONE) 25 MG tablet Take 0.5 tablets (12.5 mg total) by mouth daily. 30 tablet 6  . torsemide (DEMADEX) 20 MG tablet Take 1 tablet (20 mg total) by mouth 2 (two) times daily. 60 tablet 6   No current facility-administered medications for this encounter.    No Active Allergies    Filed Vitals:   11/30/14 1005  BP: 110/66  Pulse: 67  Weight: 99 lb 8 oz (45.133 kg)  SpO2: 100%    PHYSICAL EXAM: General: Elderly, chronically ill appearing.  NAD  HEENT: normal Neck: supple. JVP 8 cm.  Carotids 2+ bilat; no bruits. No lymphadenopathy or thryomegaly appreciated. Cor: PMI nondisplaced. Regular rate & rhythm. Paradoxical S2 split Lungs: Crackles bibasilarly  Abdomen: soft, nontender, + distended. No hepatosplenomegaly. No bruits or masses. +BS Extremities: no cyanosis, clubbing, rash.  1+ ankle edema.   Neuro: alert & oriented x 3, cranial nerves grossly intact. moves all 4 extremities w/o difficulty. Affect pleasant.  ASSESSMENT & PLAN: 1. Chronic systolic HF: Due to nonischemic cardiomyopathy.  Echo (4/16) with EF 20% and moderate RV systolic dysfunction. Cath 07/2014 with no CAD.  Echo in 6/16 showed persistent EF 15-20% with moderate RV dysfunction as well as recurrence of moderate to large pericardial effusion. Echo in 10/2014 with EF 10-15% and moderate effusion. CMRI (8/16) showed LV EF 11%, RV EF 30%, and patchy mid-wall LGE in the basal-mid septum that may suggest prior myocarditis.  NYHA II-III symptoms.  She has mild volume overload on exam.  Treatment is extremely limited by poor compliance and poor insight into her illness.  She has advanced heart failure.   - Continue torsemide 20 mg daily, she will not take it bid.   - Today I will stop spironolactone and digoxin as she is not taking and refuses to restart.   - Continue  lisinopril 2.5 mg bid.  - Continue carvedilol 3.125 mg twice a day.  - Check BMET now.  - She has advanced heart failure with high risk of morbidity/mortality over the next 6 months but she has great difficulty following cardiology recommendations. She is a candidate for ICD but is not interested in this.  ECGs have shown IVCD with QRS 152 msec, so CRT would be a consideration if she ever agrees to device therapy which seems unlikely.  2. PAF:  Maintaining NSR.   - Cut back amio to 100 mg daily.  Check LFTs and TSH at next appointment.  She was told to get regular eye exams while on amiodarone.  - Continue apixaban. I have asked her to take twice a day. HF pharmacist met with her and reinforced compliance.  3. Pericardial effusion: s/p pericardial window. Transudate, cytology negative.  Last ECHO 10/2014 EF 10-15% and moderate pericardial effusion => slightly smaller than prior, no definite tamponade.   She is very difficult to manage due to poor insight and medication noncompliance. She is at high risk for readmit. Need to keep medication as simple as possible.   Follow up in 1 month.  CLEGG,AMY NP-C  10:26 AM   Patient seen with NP, agree with the above note.  She was recently admitted and diuresed.  Unfortunately, she is still not taking her medications as ordered.  She is taking some and not taking others.  She is taking some once a day when they are supposed to be twice a day.  She is probably mildly volume overloaded but overall compensated.  We extensively went over her medication regimen with her and explained the rationale behind all her medications.  We simplified her regimen as above.  She remains in NSR.  She has severe CHF but is unlikely to qualify for advanced therapies in the future due to poor compliance.   Loralie Champagne 11/30/2014

## 2014-11-30 NOTE — Patient Instructions (Addendum)
STOP Digoxin.  STOP Spironolactone  DECREASE Amiodarone to 100mg  daily.  DECREASE Torsemide 20mg  daily.  Routine lab work today. (bmet) Will notify you of abnormal results  FOLLOW UP: 4 weeks in NP clinic.

## 2014-12-01 ENCOUNTER — Encounter (HOSPITAL_COMMUNITY): Payer: Medicare Other

## 2014-12-03 ENCOUNTER — Encounter (HOSPITAL_COMMUNITY): Payer: Medicare Other

## 2014-12-03 ENCOUNTER — Encounter: Payer: Self-pay | Admitting: Cardiology

## 2014-12-06 ENCOUNTER — Encounter (HOSPITAL_COMMUNITY): Payer: Medicare Other

## 2014-12-08 ENCOUNTER — Encounter (HOSPITAL_COMMUNITY): Payer: Medicare Other

## 2014-12-10 ENCOUNTER — Encounter (HOSPITAL_COMMUNITY): Payer: Medicare Other

## 2014-12-13 ENCOUNTER — Encounter (HOSPITAL_COMMUNITY): Payer: Medicare Other

## 2014-12-15 ENCOUNTER — Encounter (HOSPITAL_COMMUNITY): Payer: Medicare Other

## 2014-12-17 ENCOUNTER — Encounter (HOSPITAL_COMMUNITY): Payer: Medicare Other

## 2014-12-20 ENCOUNTER — Encounter (HOSPITAL_COMMUNITY): Payer: Medicare Other

## 2014-12-22 ENCOUNTER — Encounter (HOSPITAL_COMMUNITY): Payer: Medicare Other

## 2014-12-24 ENCOUNTER — Encounter (HOSPITAL_COMMUNITY): Payer: Medicare Other

## 2014-12-27 ENCOUNTER — Encounter (HOSPITAL_COMMUNITY): Payer: Medicare Other

## 2014-12-28 ENCOUNTER — Ambulatory Visit (HOSPITAL_COMMUNITY)
Admission: RE | Admit: 2014-12-28 | Discharge: 2014-12-28 | Disposition: A | Discharge status: Home / Self Care | Payer: Medicare Other | Source: Ambulatory Visit | Attending: Internal Medicine | Admitting: Internal Medicine

## 2014-12-28 VITALS — BP 122/74 | HR 77 | Wt 100.4 lb

## 2014-12-28 DIAGNOSIS — Z79899 Other long term (current) drug therapy: Secondary | ICD-10-CM | POA: Diagnosis not present

## 2014-12-28 DIAGNOSIS — I480 Paroxysmal atrial fibrillation: Secondary | ICD-10-CM | POA: Diagnosis not present

## 2014-12-28 DIAGNOSIS — Z7902 Long term (current) use of antithrombotics/antiplatelets: Secondary | ICD-10-CM | POA: Insufficient documentation

## 2014-12-28 DIAGNOSIS — I5022 Chronic systolic (congestive) heart failure: Secondary | ICD-10-CM

## 2014-12-28 DIAGNOSIS — I3139 Other pericardial effusion (noninflammatory): Secondary | ICD-10-CM

## 2014-12-28 DIAGNOSIS — I429 Cardiomyopathy, unspecified: Secondary | ICD-10-CM | POA: Diagnosis not present

## 2014-12-28 DIAGNOSIS — I428 Other cardiomyopathies: Secondary | ICD-10-CM | POA: Insufficient documentation

## 2014-12-28 DIAGNOSIS — Z9119 Patient's noncompliance with other medical treatment and regimen: Secondary | ICD-10-CM | POA: Diagnosis not present

## 2014-12-28 DIAGNOSIS — I10 Essential (primary) hypertension: Secondary | ICD-10-CM | POA: Insufficient documentation

## 2014-12-28 DIAGNOSIS — I313 Pericardial effusion (noninflammatory): Secondary | ICD-10-CM | POA: Diagnosis not present

## 2014-12-28 DIAGNOSIS — I319 Disease of pericardium, unspecified: Secondary | ICD-10-CM

## 2014-12-28 NOTE — Progress Notes (Signed)
Advanced Heart Failure Medication Review by a Pharmacist  Does the patient  feel that his/her medications are working for him/her?  yes  Has the patient been experiencing any side effects to the medications prescribed?  no  Does the patient measure his/her own blood pressure or blood glucose at home?  no   Does the patient have any problems obtaining medications due to transportation or finances?   no  Understanding of regimen: fair Understanding of indications: fair Potential of compliance: poor Patient understands to avoid NSAIDs. Patient understands to avoid decongestants.  Issues to address at subsequent visits: Compliance   Pharmacist comments:  Ms. Tealer is a 69 yo F presenting without a medication list. She states that she takes all of her medications once a day. I spoke with her extensively at this appointment as well as her last appointment about the importance of compliance with all of her medications including her Eliquis. We discussed the potential to switch her to Xarelto which would be once a day but she states that she does not want to do that and that she will start taking her Eliquis twice daily. The reason for her non-compliance seems to be her fear of the potential side effects of each of her medications. We discussed, at length, the indication for each medication and the potential side effects associated with each. I also explained that, in her case, the benefits of her regimen do outweigh the risks. We will continue to monitor closely for all of these side effects and I will continue to reinforce the importance of consistent use of her medications.   Tyler Deis. Bonnye Fava, PharmD, BCPS, CPP Clinical Pharmacist Pager: [phone] Phone: [phone] 12/28/2014 12:03 PM    Time with patient: 15 minutes Preparation and documentation time: 4 minutes  Total time: 19 minutes

## 2014-12-28 NOTE — Progress Notes (Signed)
Patient ID: Katrina Peck, female   DOB: [birth year], 69 y.o.   MRN: [id number] P   ADVANCED HF CLINIC NOTE  PCP: Dr. Ernie Hew Cardiology: Dr. Aundra Dubin  69 y/o female with history of HTN who was admitted 07/20/14 with 3 months of exertional dyspnea, weight gain, cough, and orthopnea. Found to have EF 20% and a large pericardial effusion. Cath 5/16 no CAD. Hospital course complicated by atrial fibrillation w/ RVR, converted to NSR on amiodarone. Underwent pericardial window (transudate).    She had a repeat echo in 6/16 that showed persistently low LV EF at 15-20% with recurrence of moderate to large pericardial effusion without tamponade. This showed EF 10-15%, mild LV dilation, restrictive diastolic function, moderate to severely decreased RV function, and a moderate to large pericardial effusion similar to 6/16.   She presents today for follow up with her husband. Overall feeling ok. Mild dyspnea with exertion. Denies PND. Requires rest breaks. Taking torsemide every other day. Not take carvedilol or apixaban daily. Once a day most days. Weight at 98-103 pounds. .   Labs  (5/16): K 4.6, creatinine 1.0 => 0.81, TSH normal, LFTs normal, TSH normal, BNP 1268 (7/16): K 5.3 => 5.4, creatinine 1.0 => 1.02, HCT 39.7, BNP > 4500 => 1082, SPEP negative, urine IFE negative (8/16): K 5, creatinine 0.83 (11/30/14): K 4.2 Creatinine 0.79    SH: Married, no smoking, no ETOH.   FH: No history of cardiomyopathy or sudden death.    ROS: All systems reviewed and negative except as per HPI.   PMH: 1. Chronic systolic CHF: Nonischemic cardiomyopathy.  LHC/RHC (4/16) with no significant coronary disease; mean RA 5, PA 28/11, mean PCWP 9, CI 3.39.  Echo (4/16): EF 20% with diffuse hypokinesis, moderate RV systolic dysfunction, large pericardial effusion.  No history of drug or ETOH abuse.  No family history of cardiomyopathy.   Echo (6/16) with EF 15-20%, restrictive diastolic function, moderate MR, moderately  decreased RV systolic function, PA systolic pressure 49 mmHg, moderate-large pericardial effusion without tamponade.  Echo (7/16) with EF 10-15%, mild LV dilation, diffuse hypokinesis, restrictive diastolic dysfunction, moderate MR, moderate to severely decreased RV systolic function, moderate TR, PA systolic pressure 51 mmHg, moderate to large pericardial effusion, no change from 6/16.  Echo (8/16) with EF 10-15%, mild to moderately reduced RV systolic function, moderate pericardial effusion (slightly smaller than prior), PASP 63 mmHg.  Cardiac MRI (8/16) with LV EF 11%, RV EF 30%, mid-wall LGE involving the basal to mid septum (?myocarditis), moderate to large pericardial effusion with no evidence for tamponade.  2. Pericardial effusion: Large on 4/16 echo.  Patient had pericardial window for diagnostic and therapeutic purposes.  Cytology negative for malignancy.  6/16 echo with recurrence of moderate-large pericardial effusion without tamponade, similar repeat echo in 7/16, effusion slightly small in 8/16 by echo and MRI.  3. Atrial fibrillation: Atrial fibrillation with RVR, converted to NSR with amiodarone.   4. HTN 5. Noncompliance  Current Outpatient Prescriptions  Medication Sig Dispense Refill  . amiodarone (PACERONE) 100 MG tablet Take 1 tablet (100 mg total) by mouth daily. 30 tablet 5  . apixaban (ELIQUIS) 5 MG TABS tablet Take 1 tablet (5 mg total) by mouth 2 (two) times daily. 60 tablet 5  . carvedilol (COREG) 3.125 MG tablet Take 3.125 mg by mouth daily.    Marland Kitchen lisinopril (PRINIVIL,ZESTRIL) 2.5 MG tablet Take 2.5 mg by mouth daily.    Marland Kitchen torsemide (DEMADEX) 20 MG tablet Take 1 tablet (20 mg  total) by mouth daily. 60 tablet 6   No current facility-administered medications for this encounter.    No Known Allergies    Filed Vitals:   12/28/14 1145  BP: 122/74  Pulse: 77  Weight: 100 lb 6.4 oz (45.541 kg)  SpO2: 99%    PHYSICAL EXAM: General: Elderly, chronically ill appearing.   NAD . Husband present.  HEENT: normal Neck: supple. JVP 6-7  cm.  Carotids 2+ bilat; no bruits. No lymphadenopathy or thryomegaly appreciated. Cor: PMI nondisplaced. Regular rate & rhythm. Paradoxical S2 split Lungs: Crackles bibasilarly  Abdomen: soft, nontender, + distended. No hepatosplenomegaly. No bruits or masses. +BS Extremities: no cyanosis, clubbing, rash.  Trace ankle edema.   Neuro: alert & oriented x 3, cranial nerves grossly intact. moves all 4 extremities w/o difficulty. Affect pleasant.  ASSESSMENT & PLAN: 1. Chronic systolic HF: Due to nonischemic cardiomyopathy.  Echo (4/16) with EF 20% and moderate RV systolic dysfunction. Cath 07/2014 with no CAD.  Echo in 6/16 showed persistent EF 15-20% with moderate RV dysfunction as well as recurrence of moderate to large pericardial effusion. Echo in 10/2014 with EF 10-15% and moderate effusion. CMRI (8/16) showed LV EF 11%, RV EF 30%, and patchy mid-wall LGE in the basal-mid septum that may suggest prior myocarditis.  NYHA II-III symptoms.   Volume status ok and now she is only agreeable to taking torsemide every other day.  So I will continue every other day.   - Off  spironolactone and digoxin due to compliance.  - Continue lisinopril 2.5 mg bid.  - Continue carvedilol 3.125 mg twice a day.  - She has advanced heart failure with high risk of morbidity/mortality over the next 6 months but she has great difficulty following cardiology recommendations. She is not a candidate for ICD due to the above.  2. PAF:  Maintaining NSR.   - Continue amio to 100 mg daily.  Check LFTs and TSH at next appointment.  She was told to get regular eye exams while on amiodarone.  - Continue apixaban. I have asked her to take twice a day. She continues only take once a day. HF pharmacist met with her and reinforced compliance. She understands she is at risk for stroke if she does take apixaban as ordered.  3. Pericardial effusion: s/p pericardial window.  Transudate, cytology negative.  Last ECHO 10/2014 EF 10-15% and moderate pericardial effusion => slightly smaller than prior, no definite tamponade.   She remains increasingly difficult to manage due to ongoing medicaiton compliance . Needs to be followed closely in the HF clinic however she is only agreeable to follow up in 6 weeks.    Olivine Hiers NP-C  11:53 AM

## 2014-12-28 NOTE — Patient Instructions (Signed)
Follow up 6 weeks with Amy   Continue current medications!

## 2014-12-29 ENCOUNTER — Encounter (HOSPITAL_COMMUNITY): Payer: Medicare Other

## 2014-12-31 ENCOUNTER — Encounter (HOSPITAL_COMMUNITY): Payer: Medicare Other

## 2015-01-03 ENCOUNTER — Encounter (HOSPITAL_COMMUNITY): Payer: Medicare Other

## 2015-01-05 ENCOUNTER — Encounter (HOSPITAL_COMMUNITY): Payer: Medicare Other

## 2015-01-05 DIAGNOSIS — Z23 Encounter for immunization: Secondary | ICD-10-CM | POA: Diagnosis not present

## 2015-01-05 DIAGNOSIS — E782 Mixed hyperlipidemia: Secondary | ICD-10-CM | POA: Diagnosis not present

## 2015-01-05 DIAGNOSIS — I10 Essential (primary) hypertension: Secondary | ICD-10-CM | POA: Diagnosis not present

## 2015-01-05 DIAGNOSIS — I517 Cardiomegaly: Secondary | ICD-10-CM | POA: Diagnosis not present

## 2015-01-07 ENCOUNTER — Encounter (HOSPITAL_COMMUNITY): Payer: Medicare Other

## 2015-01-10 ENCOUNTER — Encounter (HOSPITAL_COMMUNITY): Payer: Medicare Other

## 2015-01-12 ENCOUNTER — Encounter (HOSPITAL_COMMUNITY): Payer: Medicare Other

## 2015-01-14 ENCOUNTER — Encounter (HOSPITAL_COMMUNITY): Payer: Medicare Other

## 2015-01-17 ENCOUNTER — Encounter (HOSPITAL_COMMUNITY): Payer: Medicare Other

## 2015-01-19 ENCOUNTER — Encounter (HOSPITAL_COMMUNITY): Payer: Medicare Other

## 2015-01-20 ENCOUNTER — Telehealth (HOSPITAL_COMMUNITY): Payer: Self-pay | Admitting: Vascular Surgery

## 2015-01-20 NOTE — Telephone Encounter (Signed)
Husband came by office and said that his wife at home is very short of breath Was given husbands cell phone to talk to her She is able to talk complete sentences rapidly without sounding short winded Said she took her Torsemide last night but has been short of breath walking to bathroom She refuses to go to the ED.  She wants just to take any medication at home. She said she  can take an extra torsemide today.  She declined appointment this afternoon with Dr. Gala Romney.  Informed husband that we made an appointment for her with Dr. Shirlee Latch at 0930 tomorrow.  I instructed her that she needs to go to the ED before than if the torsemide doesn't help.  She said that she would.  She said that she does not want to go to the ED in fear of getting admitted

## 2015-01-20 NOTE — Telephone Encounter (Signed)
Pt is struggling to breath , husband walked in to the waiting area wants someone to call pt ASAP.Marland Kitchen PLEASE ADVISE

## 2015-01-21 ENCOUNTER — Encounter (HOSPITAL_COMMUNITY): Payer: Medicare Other

## 2015-01-25 ENCOUNTER — Encounter (HOSPITAL_COMMUNITY): Payer: Self-pay | Admitting: *Deleted

## 2015-02-03 ENCOUNTER — Emergency Department (HOSPITAL_COMMUNITY): Payer: Medicare Other

## 2015-02-03 ENCOUNTER — Inpatient Hospital Stay (HOSPITAL_COMMUNITY)
Admission: EM | Admit: 2015-02-03 | Discharge: 2015-02-09 | DRG: 287 | Disposition: A | Discharge status: Home / Self Care | Payer: Medicare Other | Attending: Cardiology | Admitting: Cardiology

## 2015-02-03 ENCOUNTER — Encounter (HOSPITAL_COMMUNITY): Payer: Self-pay

## 2015-02-03 DIAGNOSIS — I447 Left bundle-branch block, unspecified: Secondary | ICD-10-CM | POA: Diagnosis not present

## 2015-02-03 DIAGNOSIS — R54 Age-related physical debility: Secondary | ICD-10-CM | POA: Diagnosis present

## 2015-02-03 DIAGNOSIS — I319 Disease of pericardium, unspecified: Secondary | ICD-10-CM | POA: Diagnosis not present

## 2015-02-03 DIAGNOSIS — I5023 Acute on chronic systolic (congestive) heart failure: Secondary | ICD-10-CM | POA: Diagnosis not present

## 2015-02-03 DIAGNOSIS — T462X6A Underdosing of other antidysrhythmic drugs, initial encounter: Secondary | ICD-10-CM | POA: Diagnosis present

## 2015-02-03 DIAGNOSIS — I429 Cardiomyopathy, unspecified: Secondary | ICD-10-CM | POA: Diagnosis not present

## 2015-02-03 DIAGNOSIS — R7989 Other specified abnormal findings of blood chemistry: Secondary | ICD-10-CM | POA: Diagnosis not present

## 2015-02-03 DIAGNOSIS — Z7901 Long term (current) use of anticoagulants: Secondary | ICD-10-CM

## 2015-02-03 DIAGNOSIS — Z91128 Patient's intentional underdosing of medication regimen for other reason: Secondary | ICD-10-CM

## 2015-02-03 DIAGNOSIS — I5021 Acute systolic (congestive) heart failure: Secondary | ICD-10-CM | POA: Diagnosis not present

## 2015-02-03 DIAGNOSIS — T447X6A Underdosing of beta-adrenoreceptor antagonists, initial encounter: Secondary | ICD-10-CM | POA: Diagnosis present

## 2015-02-03 DIAGNOSIS — I110 Hypertensive heart disease with heart failure: Secondary | ICD-10-CM | POA: Diagnosis not present

## 2015-02-03 DIAGNOSIS — T501X6A Underdosing of loop [high-ceiling] diuretics, initial encounter: Secondary | ICD-10-CM | POA: Diagnosis present

## 2015-02-03 DIAGNOSIS — R0602 Shortness of breath: Secondary | ICD-10-CM | POA: Diagnosis not present

## 2015-02-03 DIAGNOSIS — E876 Hypokalemia: Secondary | ICD-10-CM | POA: Diagnosis not present

## 2015-02-03 DIAGNOSIS — R630 Anorexia: Secondary | ICD-10-CM | POA: Diagnosis present

## 2015-02-03 DIAGNOSIS — R778 Other specified abnormalities of plasma proteins: Secondary | ICD-10-CM | POA: Insufficient documentation

## 2015-02-03 DIAGNOSIS — I480 Paroxysmal atrial fibrillation: Secondary | ICD-10-CM | POA: Diagnosis not present

## 2015-02-03 DIAGNOSIS — I214 Non-ST elevation (NSTEMI) myocardial infarction: Secondary | ICD-10-CM | POA: Diagnosis not present

## 2015-02-03 DIAGNOSIS — Z681 Body mass index (BMI) 19 or less, adult: Secondary | ICD-10-CM

## 2015-02-03 DIAGNOSIS — I509 Heart failure, unspecified: Secondary | ICD-10-CM | POA: Diagnosis not present

## 2015-02-03 DIAGNOSIS — I313 Pericardial effusion (noninflammatory): Secondary | ICD-10-CM | POA: Diagnosis not present

## 2015-02-03 LAB — CBC WITH DIFFERENTIAL/PLATELET
BASOS ABS: 0 10*3/uL (ref 0.0–0.1)
Basophils Relative: 0 %
EOS ABS: 0 10*3/uL (ref 0.0–0.7)
EOS PCT: 0 %
HCT: 40 % (ref 36.0–46.0)
Hemoglobin: 12.9 g/dL (ref 12.0–15.0)
LYMPHS ABS: 1.2 10*3/uL (ref 0.7–4.0)
LYMPHS PCT: 15 %
MCH: 28 pg (ref 26.0–34.0)
MCHC: 32.3 g/dL (ref 30.0–36.0)
MCV: 87 fL (ref 78.0–100.0)
MONO ABS: 0.7 10*3/uL (ref 0.1–1.0)
Monocytes Relative: 9 %
Neutro Abs: 6 10*3/uL (ref 1.7–7.7)
Neutrophils Relative %: 76 %
PLATELETS: 232 10*3/uL (ref 150–400)
RBC: 4.6 MIL/uL (ref 3.87–5.11)
RDW: 17.4 % — AB (ref 11.5–15.5)
WBC: 7.8 10*3/uL (ref 4.0–10.5)

## 2015-02-03 LAB — BASIC METABOLIC PANEL
Anion gap: 7 (ref 5–15)
BUN: 23 mg/dL — AB (ref 6–20)
CALCIUM: 8.9 mg/dL (ref 8.9–10.3)
CO2: 27 mmol/L (ref 22–32)
Chloride: 105 mmol/L (ref 101–111)
Creatinine, Ser: 0.79 mg/dL (ref 0.44–1.00)
GFR calc Af Amer: >60 mL/min (ref >60)
GLUCOSE: 105 mg/dL — AB (ref 65–99)
POTASSIUM: 4.8 mmol/L (ref 3.5–5.1)
SODIUM: 139 mmol/L (ref 135–145)

## 2015-02-03 LAB — BRAIN NATRIURETIC PEPTIDE: B Natriuretic Peptide: 4003.4 pg/mL — ABNORMAL HIGH (ref 0.0–100.0)

## 2015-02-03 LAB — TROPONIN I: TROPONIN I: 3.87 ng/mL — AB (ref <0.031)

## 2015-02-03 NOTE — ED Notes (Signed)
Pt. In xray 

## 2015-02-03 NOTE — ED Notes (Signed)
Pt complains of being short of breath and fatigued for over one month

## 2015-02-03 NOTE — ED Notes (Addendum)
Pt. Requested to sit on bed pan, attempted 3 times to take pt. Off bed pan. Notified RN,Kenzie.

## 2015-02-03 NOTE — Progress Notes (Signed)
Patient listed as having Medicare insurance without a pcp.  Patient reports her pcp is Dr. Maryelizabeth Rowan.  System updated.

## 2015-02-03 NOTE — ED Notes (Signed)
EKG given to EDP,Delo,MD., for review. 

## 2015-02-03 NOTE — ED Notes (Addendum)
Pt. Ambulated down the hall and back to her room on 98% room air and 94 heart rate. Pt. Declined shortness of breath while walking. Upon return to room pt. Stated shortness of breath when back to her room, pt. spo2 decreased but returned back to baseline without oxygen.

## 2015-02-04 ENCOUNTER — Inpatient Hospital Stay (HOSPITAL_COMMUNITY): Payer: Medicare Other

## 2015-02-04 DIAGNOSIS — E876 Hypokalemia: Secondary | ICD-10-CM | POA: Diagnosis not present

## 2015-02-04 DIAGNOSIS — I5023 Acute on chronic systolic (congestive) heart failure: Secondary | ICD-10-CM | POA: Diagnosis present

## 2015-02-04 DIAGNOSIS — R7989 Other specified abnormal findings of blood chemistry: Secondary | ICD-10-CM | POA: Insufficient documentation

## 2015-02-04 DIAGNOSIS — T447X6A Underdosing of beta-adrenoreceptor antagonists, initial encounter: Secondary | ICD-10-CM | POA: Diagnosis present

## 2015-02-04 DIAGNOSIS — I509 Heart failure, unspecified: Secondary | ICD-10-CM

## 2015-02-04 DIAGNOSIS — R0602 Shortness of breath: Secondary | ICD-10-CM | POA: Diagnosis not present

## 2015-02-04 DIAGNOSIS — T501X6A Underdosing of loop [high-ceiling] diuretics, initial encounter: Secondary | ICD-10-CM | POA: Diagnosis present

## 2015-02-04 DIAGNOSIS — I480 Paroxysmal atrial fibrillation: Secondary | ICD-10-CM | POA: Diagnosis not present

## 2015-02-04 DIAGNOSIS — I319 Disease of pericardium, unspecified: Secondary | ICD-10-CM | POA: Diagnosis not present

## 2015-02-04 DIAGNOSIS — I447 Left bundle-branch block, unspecified: Secondary | ICD-10-CM | POA: Diagnosis not present

## 2015-02-04 DIAGNOSIS — Z91128 Patient's intentional underdosing of medication regimen for other reason: Secondary | ICD-10-CM | POA: Diagnosis not present

## 2015-02-04 DIAGNOSIS — T462X6A Underdosing of other antidysrhythmic drugs, initial encounter: Secondary | ICD-10-CM | POA: Diagnosis present

## 2015-02-04 DIAGNOSIS — I313 Pericardial effusion (noninflammatory): Secondary | ICD-10-CM | POA: Diagnosis not present

## 2015-02-04 DIAGNOSIS — R630 Anorexia: Secondary | ICD-10-CM | POA: Diagnosis present

## 2015-02-04 DIAGNOSIS — R778 Other specified abnormalities of plasma proteins: Secondary | ICD-10-CM | POA: Insufficient documentation

## 2015-02-04 DIAGNOSIS — Z7901 Long term (current) use of anticoagulants: Secondary | ICD-10-CM | POA: Diagnosis not present

## 2015-02-04 DIAGNOSIS — I429 Cardiomyopathy, unspecified: Secondary | ICD-10-CM | POA: Diagnosis not present

## 2015-02-04 DIAGNOSIS — Z681 Body mass index (BMI) 19 or less, adult: Secondary | ICD-10-CM | POA: Diagnosis not present

## 2015-02-04 DIAGNOSIS — I214 Non-ST elevation (NSTEMI) myocardial infarction: Secondary | ICD-10-CM | POA: Diagnosis not present

## 2015-02-04 DIAGNOSIS — I5021 Acute systolic (congestive) heart failure: Secondary | ICD-10-CM | POA: Diagnosis present

## 2015-02-04 DIAGNOSIS — R54 Age-related physical debility: Secondary | ICD-10-CM | POA: Diagnosis present

## 2015-02-04 DIAGNOSIS — I110 Hypertensive heart disease with heart failure: Secondary | ICD-10-CM | POA: Diagnosis present

## 2015-02-04 LAB — BASIC METABOLIC PANEL
ANION GAP: 9 (ref 5–15)
BUN: 25 mg/dL — ABNORMAL HIGH (ref 6–20)
CALCIUM: 9 mg/dL (ref 8.9–10.3)
CHLORIDE: 102 mmol/L (ref 101–111)
CO2: 27 mmol/L (ref 22–32)
Creatinine, Ser: 1.08 mg/dL — ABNORMAL HIGH (ref 0.44–1.00)
GFR calc non Af Amer: 52 mL/min — ABNORMAL LOW (ref >60)
GFR, EST AFRICAN AMERICAN: 60 mL/min — AB (ref >60)
Glucose, Bld: 97 mg/dL (ref 65–99)
Potassium: 3.8 mmol/L (ref 3.5–5.1)
Sodium: 138 mmol/L (ref 135–145)

## 2015-02-04 LAB — TROPONIN I
TROPONIN I: 3.83 ng/mL — AB (ref <0.031)
TROPONIN I: 2.55 ng/mL — AB (ref <0.031)
Troponin I: 2.28 ng/mL (ref <0.031)

## 2015-02-04 LAB — MAGNESIUM: Magnesium: 1.9 mg/dL (ref 1.7–2.4)

## 2015-02-04 LAB — I-STAT CG4 LACTIC ACID, ED: Lactic Acid, Venous: 1.43 mmol/L (ref 0.5–2.0)

## 2015-02-04 MED ORDER — APIXABAN 5 MG PO TABS
5.0000 mg | ORAL_TABLET | Freq: Two times a day (BID) | ORAL | Status: DC
  Administered 2015-02-04 – 2015-02-06 (×6): 5 mg via ORAL
  Filled 2015-02-04 (×6): qty 1

## 2015-02-04 MED ORDER — ASPIRIN 81 MG PO CHEW
324.0000 mg | CHEWABLE_TABLET | Freq: Once | ORAL | Status: AC
  Administered 2015-02-04: 324 mg via ORAL
  Filled 2015-02-04: qty 4

## 2015-02-04 MED ORDER — AMIODARONE HCL 100 MG PO TABS
100.0000 mg | ORAL_TABLET | Freq: Every day | ORAL | Status: DC
  Administered 2015-02-04 – 2015-02-09 (×6): 100 mg via ORAL
  Filled 2015-02-04 (×6): qty 1

## 2015-02-04 MED ORDER — MORPHINE SULFATE (PF) 4 MG/ML IV SOLN
4.0000 mg | Freq: Once | INTRAVENOUS | Status: AC
  Administered 2015-02-04: 4 mg via INTRAVENOUS
  Filled 2015-02-04: qty 1

## 2015-02-04 MED ORDER — FUROSEMIDE 10 MG/ML IJ SOLN
40.0000 mg | Freq: Two times a day (BID) | INTRAMUSCULAR | Status: DC
  Administered 2015-02-04: 40 mg via INTRAVENOUS
  Filled 2015-02-04: qty 4

## 2015-02-04 MED ORDER — FUROSEMIDE 10 MG/ML IJ SOLN
40.0000 mg | Freq: Three times a day (TID) | INTRAMUSCULAR | Status: DC
  Administered 2015-02-04 – 2015-02-06 (×6): 40 mg via INTRAVENOUS
  Filled 2015-02-04 (×6): qty 4

## 2015-02-04 MED ORDER — ACETAMINOPHEN 325 MG PO TABS: 650.0000 mg | ORAL_TABLET | ORAL | Status: DC | PRN

## 2015-02-04 MED ORDER — CARVEDILOL 3.125 MG PO TABS
3.1250 mg | ORAL_TABLET | Freq: Every day | ORAL | Status: DC
  Administered 2015-02-04: 3.125 mg via ORAL
  Filled 2015-02-04: qty 1

## 2015-02-04 MED ORDER — FUROSEMIDE 10 MG/ML IJ SOLN
40.0000 mg | Freq: Once | INTRAMUSCULAR | Status: AC
  Administered 2015-02-04: 40 mg via INTRAVENOUS
  Filled 2015-02-04: qty 4

## 2015-02-04 MED ORDER — SODIUM CHLORIDE 0.9 % IJ SOLN: 3.0000 mL | INTRAMUSCULAR | Status: DC | PRN

## 2015-02-04 MED ORDER — ALPRAZOLAM 0.25 MG PO TABS
0.2500 mg | ORAL_TABLET | Freq: Two times a day (BID) | ORAL | Status: DC | PRN
  Filled 2015-02-04: qty 1

## 2015-02-04 MED ORDER — LISINOPRIL 2.5 MG PO TABS
2.5000 mg | ORAL_TABLET | Freq: Every day | ORAL | Status: DC
  Administered 2015-02-04: 2.5 mg via ORAL
  Filled 2015-02-04: qty 1

## 2015-02-04 MED ORDER — SODIUM CHLORIDE 0.9 % IV SOLN: 250.0000 mL | INTRAVENOUS | Status: DC | PRN

## 2015-02-04 MED ORDER — ONDANSETRON HCL 4 MG/2ML IJ SOLN: 4.0000 mg | Freq: Four times a day (QID) | INTRAMUSCULAR | Status: DC | PRN

## 2015-02-04 MED ORDER — ASPIRIN EC 81 MG PO TBEC
81.0000 mg | DELAYED_RELEASE_TABLET | Freq: Every day | ORAL | Status: DC
  Administered 2015-02-04 – 2015-02-09 (×6): 81 mg via ORAL
  Filled 2015-02-04 (×6): qty 1

## 2015-02-04 MED ORDER — SODIUM CHLORIDE 0.9 % IJ SOLN
3.0000 mL | Freq: Two times a day (BID) | INTRAMUSCULAR | Status: DC
  Administered 2015-02-04 – 2015-02-07 (×7): 3 mL via INTRAVENOUS

## 2015-02-04 NOTE — ED Provider Notes (Signed)
CSN: [id number]     Arrival date & time 02/03/15  2004 History   First MD Initiated Contact with Patient 02/03/15 2138     Chief Complaint  Patient presents with  . Shortness of Breath     (Consider location/radiation/quality/duration/timing/severity/associated sxs/prior Treatment) HPI Comments: Pt with NICM with EF of 15-20 percent comes in with cc of dib. PT reports that she has always had some dyspnea at baseline, but her dib has gotten worse over the past month. Few months back she was able to ambulate in her neighborhood, but now she can barely walk 2-3 steps w/o getting short of breath. Today, she finally decided to come in to the ER. Her weight is around 102 lbs, so not too shy of her dry weight. She has no chest pain, cough. She does have worsening orthopnea and PND like symptoms. Pt has been taking her meds as prescribed.   ROS 10 Systems reviewed and are negative for acute change except as noted in the HPI.     Patient is a 69 y.o. female presenting with shortness of breath. The history is provided by the patient.  Shortness of Breath   Past Medical History  Diagnosis Date  . Anemia   . Hypertension   . Blood transfusion without reported diagnosis   . Chronic systolic CHF (congestive heart failure) (HCC)     a. 06/2014 Echo: EF 20%.  Marland Kitchen NICM (nonischemic cardiomyopathy) (HCC)     a. 06/2014 Echo: EF 20%, diff HK, mild MR, mildly dil LA/RA, mildly reduced RV fxn;  b. 06/2014 Cath: nl cors.  . Pericardial effusion     a. 06/2014 Large effusion, no tamponade phys.   Past Surgical History  Procedure Laterality Date  . No past surgeries    . Left and right heart catheterization with coronary angiogram N/A 07/23/2014    Procedure: LEFT AND RIGHT HEART CATHETERIZATION WITH CORONARY ANGIOGRAM;  Surgeon: Laurey Morale, MD;  Location: [facility] CATH LAB;  Service: Cardiovascular;  Laterality: N/A;  . Subxyphoid pericardial window N/A 07/26/2014    Procedure: SUBXYPHOID PERICARDIAL  WINDOW;  Surgeon: Delight Ovens, MD;  Location: [facility] OR;  Service: Thoracic;  Laterality: N/A;  . Tee without cardioversion N/A 07/26/2014    Procedure: TRANSESOPHAGEAL ECHOCARDIOGRAM (TEE);  Surgeon: Delight Ovens, MD;  Location: [facility] OR;  Service: Thoracic;  Laterality: N/A;  . Pleural effusion drainage N/A 07/26/2014    Procedure: DRAINAGE OF PERICARDIAL EFFUSION;  Surgeon: Delight Ovens, MD;  Location: [facility] OR;  Service: Thoracic;  Laterality: N/A;   Family History  Problem Relation Age of Onset  . Hypertension Sister   . Diabetes Maternal Grandmother   . Cancer Mother     died young (pt was only in [employer] @ the time)  . Other Father     died in his 90's.   Social History  Substance Use Topics  . Smoking status: Never Smoker   . Smokeless tobacco: Never Used  . Alcohol Use: No   OB History    No data available     Review of Systems  Respiratory: Positive for shortness of breath.       Allergies  Review of patient's allergies indicates no known allergies.  Home Medications   Prior to Admission medications   Medication Sig Start Date End Date Taking? Authorizing Provider  amiodarone (PACERONE) 100 MG tablet Take 1 tablet (100 mg total) by mouth daily. 11/30/14  Yes Amy Georgie Chard, NP  apixaban Everlene Balls)  5 MG TABS tablet Take 1 tablet (5 mg total) by mouth 2 (two) times daily. Patient taking differently: Take 5 mg by mouth daily.  07/30/14  Yes Brittainy Sherlynn Carbon, PA-C  carvedilol (COREG) 3.125 MG tablet Take 3.125 mg by mouth daily.   Yes Historical Provider, MD  lisinopril (PRINIVIL,ZESTRIL) 2.5 MG tablet Take 2.5 mg by mouth daily.   Yes Historical Provider, MD  torsemide (DEMADEX) 20 MG tablet Take 1 tablet (20 mg total) by mouth daily. Patient taking differently: Take 20 mg by mouth every other day.  11/30/14  Yes Amy D Clegg, NP   BP 118/85 mmHg  Pulse 95  Temp(Src) 97.7 F (36.5 C) (Oral)  Resp 24  SpO2 100% Physical Exam  Constitutional: She is oriented to  person, place, and time. She appears well-developed and well-nourished.  HENT:  Head: Normocephalic and atraumatic.  Eyes: EOM are normal. Pupils are equal, round, and reactive to light.  Neck: Neck supple. JVD present.  Cardiovascular: Normal rate, regular rhythm and normal heart sounds.   No murmur heard. Pulmonary/Chest: Effort normal. No respiratory distress. She has no wheezes. She has no rales.  Poor aeration at the bases  Abdominal: Soft. She exhibits no distension. There is no tenderness. There is no rebound and no guarding.  Musculoskeletal: She exhibits no edema.  Neurological: She is alert and oriented to person, place, and time.  Skin: Skin is warm and dry.  Nursing note and vitals reviewed.   ED Course  Procedures (including critical care time) Labs Review Labs Reviewed  CBC WITH DIFFERENTIAL/PLATELET - Abnormal; Notable for the following:    RDW 17.4 (*)    All other components within normal limits  BASIC METABOLIC PANEL - Abnormal; Notable for the following:    Glucose, Bld 105 (*)    BUN 23 (*)    All other components within normal limits  TROPONIN I - Abnormal; Notable for the following:    Troponin I 3.87 (*)    All other components within normal limits  BRAIN NATRIURETIC PEPTIDE - Abnormal; Notable for the following:    B Natriuretic Peptide 4003.4 (*)    All other components within normal limits  I-STAT CG4 LACTIC ACID, ED    Imaging Review Dg Chest 2 View  02/03/2015  CLINICAL DATA:  Shortness of breath for 1 month EXAM: CHEST  2 VIEW COMPARISON:  November 11, 2014 FINDINGS: The mediastinal contour is normal. There is marked cardiomegaly. There is a small left pleural effusion and small mole to moderate right pleural effusion. There is no pulmonary edema or focal pneumonia. The visualized skeletal structures are stable. IMPRESSION: Small left pleural effusion, small to moderate right pleural effusion. No pulmonary edema. Marked cardiomegaly. Electronically  Signed   By: Sherian Rein M.D.   On: 02/03/2015 21:20   I have personally reviewed and evaluated these images and lab results as part of my medical decision-making.   EKG Interpretation   Date/Time:  Thursday February 03 2015 21:16:01 EST Ventricular Rate:  96 PR Interval:  153 QRS Duration: 145 QT Interval:  434 QTC Calculation: 548 R Axis:   124 Text Interpretation:  Sinus rhythm Nonspecific intraventricular conduction  delay Lateral infarct, recent Nonspecific T wave abnormality No  significant change since last tracing Confirmed by Avyukth Bontempo, MD, Frimy Uffelman  [phone]) on 02/03/2015 9:46:07 PM      MDM   Final diagnoses:  Acute systolic heart failure (HCC)  Elevated troponin    Pt comes in with cc  of dib. She has advanced CHF, with a neg cath. Pt has JVD. Skin is warm, BP is stable - although HR is elevated in the 90s and 100s. Pt's EKG has no signs of electrical alternans.  DDX: Pericardial effusion, pleural effusion, pulm edema, cardiogenic shock, pulm HTN, anemia, PE  Labs returned - and the BNP is > 4000 and Trop is > 3.  Pt feels a bit tight in her chest - likely demand ischemia. Cards have been called. Lactate ordered, and pending, but clinically not in cardiogenic shock.  I reconsidered PE in the ddx - but i think that it will be better for her to get cards eval and possibly a formal echo. Pulm HTN or simply decompensated CHF more likely than submassive PE now.  CRITICAL CARE Performed by: Derwood Kaplan   Total critical care time: 35 minutes  Critical care time was exclusive of separately billable procedures and treating other patients.  Critical care was necessary to treat or prevent imminent or life-threatening deterioration.  Critical care was time spent personally by me on the following activities: development of treatment plan with patient and/or surrogate as well as nursing, discussions with consultants, evaluation of patient's response to treatment,  examination of patient, obtaining history from patient or surrogate, ordering and performing treatments and interventions, ordering and review of laboratory studies, ordering and review of radiographic studies, pulse oximetry and re-evaluation of patient's condition.     Derwood Kaplan, MD 02/04/15 [phone]

## 2015-02-04 NOTE — Progress Notes (Signed)
Echocardiogram 2D Echocardiogram has been performed.  Katrina Peck 02/04/2015, 2:16 PM

## 2015-02-04 NOTE — Progress Notes (Signed)
RN called pt not wanting IV lasix, her anxiety is increased and she is very weak at times.  She is neg. 1110 since admit.  Echo: Left ventricle: The cavity size was normal. Systolic function was severely reduced. The estimated ejection fraction was in the range of 10% to 15%. Akinesis of the anterior and inferior myocardium. - Mitral valve: There was moderate regurgitation. - Left atrium: The atrium was severely dilated. - Right ventricle: The cavity size was severely dilated. Systolic function was severely reduced. - Tricuspid valve: There was severe regurgitation. - Pulmonary arteries: Systolic pressure was moderately increased. PA peak pressure: 58 mm Hg (S). - Pericardium, extracardiac: A moderate, free-flowing pericardial effusion was identified circumferential to the heart.  Her troponins are elevated but normal coronary arteries in April 2016/ she is somewhat anxious and would like something for the anxiety.  We discussed importance of lasix BP stable. Will leave on lasix TID.  Will order xanax.  CHF team to see in AM.   She is maintaining SR with BBB

## 2015-02-04 NOTE — Progress Notes (Signed)
Utilization review completed. Faten Frieson, RN, BSN. 

## 2015-02-04 NOTE — H&P (Signed)
Patient ID: Katrina Peck MRN: [id number], DOB/AGE: May 26, 1945   Admit date: 02/03/2015   Primary Physician: Katrina Rowan, MD Primary Cardiologist:Katrina Shirlee Latch, MD  Pt. 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   Problem List  Past Medical History  Diagnosis Date  . Anemia   . Hypertension   . Blood transfusion without reported diagnosis   . Chronic systolic CHF (congestive heart failure) (HCC)     a. 06/2014 Echo: EF 20%.  Marland Kitchen NICM (nonischemic cardiomyopathy) (HCC)     a. 06/2014 Echo: EF 20%, diff HK, mild MR, mildly dil LA/RA, mildly reduced RV fxn;  b. 06/2014 Cath: nl cors.  . Pericardial effusion     a. 06/2014 Large effusion, no tamponade phys.    Past Surgical History  Procedure Laterality Date  . No past surgeries    . Left and right heart catheterization with coronary angiogram N/A 07/23/2014    Procedure: LEFT AND RIGHT HEART CATHETERIZATION WITH CORONARY ANGIOGRAM;  Surgeon: Katrina Morale, MD;  Location: [facility] CATH LAB;  Service: Cardiovascular;  Laterality: N/A;  . Subxyphoid pericardial window N/A 07/26/2014    Procedure: SUBXYPHOID PERICARDIAL WINDOW;  Surgeon: Katrina Ovens, MD;  Location: [facility] OR;  Service: Thoracic;  Laterality: N/A;  . Tee without cardioversion N/A 07/26/2014    Procedure: TRANSESOPHAGEAL ECHOCARDIOGRAM (TEE);  Surgeon: Katrina Ovens, MD;  Location: [facility] OR;  Service: Thoracic;  Laterality: N/A;  . Pleural effusion drainage N/A 07/26/2014    Procedure: DRAINAGE OF PERICARDIAL EFFUSION;  Surgeon: Katrina Ovens, MD;  Location: [facility] OR;  Service: Thoracic;  Laterality: N/A;     Allergies  No Known Allergies  HPI  50F with HTN, NICM (LVEF 11% by CMR 8/16), recurrent pericardial effusion s/p pericardial window  (transudate), moderate MR, moderate RV dysfunction, restrictive filling pattern, AF, and chronic non-adherence who presents with progressive worsening of DOE.   [name] states that she has had worsening DOE for the past few months but it got particularly worse over the past month. She reports associated fatigue, 2 pillow orthopnea, PND, a "constant" chest tightness, cough productive of clear sputum. No GI symptoms. She is not aware of LE edema. She reports she has had poor appetite. She takes her medications according to how she feels and frequently skips doses out of concerns for side effects. On review of her medication list, she is not taking any of her HF (coreg, torsemide) or AF (amiodarone, apixaban) as prescribed and is consistently taking fewer doses. She notes she "can't see any difference" when she takes the medications as prescribed. Usual weight is 98-103 pounds - weight today is 101. She presented today because she could barely walk a few feet due to SOB.   On arrival to the [facility] ER, she was hemodynamically stable: HR 105, 128/90, 95% on RA. ECG demonstrated NSR. IVCD. RAD. Unchanged compared to 11/12/14.Labs were notable for K 4.8, Cr 0.79, BNO 4003, TnI 3.87, lactate 1.43.CXR demonstrated marked cardiomegaly and small bilateral pleural effusions without frank edema. She was given  IV lasix,  IV morphine and  PO ASA. She was transferred to [facility] for admission.   Of note, she is not an ICD candidate because of chronic non-adherence to OMT.   Home Medications  Prior to Admission medications   Medication Sig Start Date  End Date Taking? Authorizing Provider  amiodarone (PACERONE) 100 MG tablet Take 1 tablet (100 mg total) by mouth daily. 11/30/14  Yes Katrina D Clegg, NP  apixaban (ELIQUIS) 5 MG TABS tablet Take 1 tablet (5 mg total) by mouth 2 (two) times daily. Patient taking differently: Take 5 mg by mouth daily.  07/30/14  Yes Katrina Sherlynn Carbon, PA-C  carvedilol (COREG) 3.125 MG  tablet Take 3.125 mg by mouth daily.   Yes Historical Provider, MD  lisinopril (PRINIVIL,ZESTRIL) 2.5 MG tablet Take 2.5 mg by mouth daily.   Yes Historical Provider, MD  torsemide (DEMADEX) 20 MG tablet Take 1 tablet (20 mg total) by mouth daily. Patient taking differently: Take 20 mg by mouth every other day.  11/30/14  Yes Katrina Georgie Chard, NP    Family History  Family History  Problem Relation Age of Onset  . Hypertension Sister   . Diabetes Maternal Grandmother   . Cancer Mother     died young (pt was only in [employer] @ the time)  . Other Father     died in his 24's.    Social History  Social History   Social History  . Marital Status: Married    Spouse Name: N/A  . Number of Children: N/A  . Years of Education: N/A   Occupational History  . Not on file.   Social History Main Topics  . Smoking status: Never Smoker   . Smokeless tobacco: Never Used  . Alcohol Use: No  . Drug Use: No  . Sexual Activity: No   Other Topics Concern  . Not on file   Social History Narrative   Lives in [place] with husband.  Not currently working.     Review of Systems General:  No chills, fever, night sweats or weight changes.  Cardiovascular:  See HPI. Dermatological: No rash, lesions/masses Respiratory: + cough, dyspnea Urologic: No hematuria, dysuria Abdominal:   No nausea, vomiting, diarrhea, bright red blood per rectum, melena, or hematemesis Neurologic:  No visual changes, wkns, changes in mental status. All other systems reviewed and are otherwise negative except as noted above.  Physical Exam  Blood pressure 118/85, pulse 95, temperature 97.7 F (36.5 C), temperature source Oral, resp. rate 24, SpO2 100 %.  General: Pleasant, NAD, thin frail Psych: Normal affect. Neuro: Alert and oriented X 3. Moves all extremities spontaneously. HEENT: Normal  Neck: Supple without bruits. JVP is 3-4cm above the sternal notch while seated nearly upright Lungs:  Resp regular and  unlabored, CTA. Heart: RRR + S3. 1/6 systolic murmur Abdomen: Soft, non-tender, non-distended, BS + x 4.  Extremities: No clubbing, cyanosis. Trace edema. DP/PT/Radials 2+ and equal bilaterally.  Labs  Troponin (Point of Care Test) No results for input(s): TROPIPOC in the last 72 hours.  Recent Labs  02/03/15 2310  TROPONINI 3.87*   Lab Results  Component Value Date   WBC 7.8 02/03/2015   HGB 12.9 02/03/2015   HCT 40.0 02/03/2015   MCV 87.0 02/03/2015   PLT 232 02/03/2015    Recent Labs Lab 02/03/15 2310  NA 139  K 4.8  CL 105  CO2 27  BUN 23*  CREATININE 0.79  CALCIUM 8.9  GLUCOSE 105*   No results found for: CHOL, HDL, LDLCALC, TRIG No results found for: DDIMER   Radiology/Studies  Dg Chest 2 View  02/03/2015  CLINICAL DATA:  Shortness of breath for 1 month EXAM: CHEST  2 VIEW COMPARISON:  November 11, 2014 FINDINGS: The mediastinal  contour is normal. There is marked cardiomegaly. There is a small left pleural effusion and small mole to moderate right pleural effusion. There is no pulmonary edema or focal pneumonia. The visualized skeletal structures are stable. IMPRESSION: Small left pleural effusion, small to moderate right pleural effusion. No pulmonary edema. Marked cardiomegaly. Electronically Signed   By: Sherian Rein M.D.   On: 02/03/2015 21:20   Relevant studies LHC/RHC (4/16) with no significant coronary disease; mean RA 5, PA 28/11, mean PCWP 9, CI 3.39.  Echo (4/16): EF 20% with diffuse hypokinesis, moderate RV systolic dysfunction, large pericardial effusion.  Echo (6/16) with EF 15-20%, restrictive diastolic function, moderate MR, moderately decreased RV systolic function, PA systolic pressure 49 mmHg, moderate-large pericardial effusion without tamponade.  Echo (7/16) with EF 10-15%, mild LV dilation, diffuse hypokinesis, restrictive diastolic dysfunction, moderate MR, moderate to severely decreased RV systolic function, moderate TR, PA systolic  pressure 51 mmHg, moderate to large pericardial effusion, no change from 6/16.  Echo (8/16) with EF 10-15%, mild to moderately reduced RV systolic function, moderate pericardial effusion (slightly smaller than prior), PASP 63 mmHg.  Cardiac MRI (8/16) with LV EF 11%, RV EF 30%, mid-wall LGE involving the basal to mid septum (?myocarditis), moderate to large pericardial effusion with no evidence for tamponade.    ECG  03-Feb-2015 21:16:01 NSR. IVCD. RAD. Unchanged compared to 11/12/14.  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 She presents with a subacute decline due to chronic non-adherence. Although her heart is very sick, she appears pretty well compensated and only somewhat volume overloaded on exam. However, given her restrictive filling pattern, I suspect her LA pressure is quite high. She has a positive troponin which is most likely 2/2 worsened HF, particularly in light having no obstructive disease during 4/16 cath. Other considerations include myocarditis (there is a question re: if that is the etiology of her myopathy), PE, and cardioembolic MI in setting of apixaban non-adherence.   1. ADHF. She is well compensated enough to dose BB and ACEi as prescribed. NYHA III/IV symptoms. She would probably feel improved with just a couple of liters of diuresis  Coreg 3.125mg  BID Lisinopril 2.5mg  daily Convert home torsemide (  QoD) to lasix  IV BID  2. NSTEMI Likely 2/2 to 1. Consider myocarditis, cardioembolic MI, and PE as potential alternative diagnoses Trend troponins Coreg 3.125mg  BID ASA  (for now) Anticoagulate with apixaban for now; would convert to UFH if Tn trend is suggestive of AMI  AF, currently in NSR amiodarone   daily apixaban  BID Coreg 3.125mg  BID  Signed, Glori Luis, MD 02/04/2015, 12:30 AM

## 2015-02-04 NOTE — Progress Notes (Signed)
Patient refused IV lasix. She states that it made her feel bad and she wants a milder form of lasix. Paged PA. Will continue to monitor.

## 2015-02-04 NOTE — Progress Notes (Signed)
Subjective:  Admitted last PM with increasing SOB, mildly improved  Objective:  Temp:  [97.7 F (36.5 C)] 97.7 F (36.5 C) (11/11 0327) Pulse Rate:  [73-95] 73 (11/11 0846) Resp:  [14-28] 20 (11/11 0846) BP: (96-144)/(65-100) 96/65 mmHg (11/11 0947) SpO2:  [95 %-100 %] 100 % (11/11 0846) Weight:  [102 lb 1.6 oz (46.312 kg)] 102 lb 1.6 oz (46.312 kg) (11/11 0327) Weight change:   Intake/Output from previous day:    Intake/Output from this shift: Total I/O In: 120 [P.O.:120] Out: 850 [Urine:850]  Physical Exam: General appearance: alert and no distress Neck: no adenopathy, no carotid bruit, supple, symmetrical, trachea midline, thyroid not enlarged, symmetric, no tenderness/mass/nodules and increased JVP Lungs: clear to auscultation bilaterally Heart: regular rate and rhythm, S1, S2 normal, no murmur, click, rub or gallop Extremities: 1+ BLE edema  Lab Results: Results for orders placed or performed during the hospital encounter of 02/03/15 (from the past 48 hour(s))  CBC with Differential/Platelet     Status: Abnormal   Collection Time: 02/03/15 11:10 PM  Result Value Ref Range   WBC 7.8 4.0 - 10.5 K/uL   RBC 4.60 3.87 - 5.11 MIL/uL   Hemoglobin 12.9 12.0 - 15.0 g/dL   HCT 40.0 36.0 - 46.0 %   MCV 87.0 78.0 - 100.0 fL   MCH 28.0 26.0 - 34.0 pg   MCHC 32.3 30.0 - 36.0 g/dL   RDW 17.4 (H) 11.5 - 15.5 %   Platelets 232 150 - 400 K/uL   Neutrophils Relative % 76 %   Neutro Abs 6.0 1.7 - 7.7 K/uL   Lymphocytes Relative 15 %   Lymphs Abs 1.2 0.7 - 4.0 K/uL   Monocytes Relative 9 %   Monocytes Absolute 0.7 0.1 - 1.0 K/uL   Eosinophils Relative 0 %   Eosinophils Absolute 0.0 0.0 - 0.7 K/uL   Basophils Relative 0 %   Basophils Absolute 0.0 0.0 - 0.1 K/uL  Basic metabolic panel     Status: Abnormal   Collection Time: 02/03/15 11:10 PM  Result Value Ref Range   Sodium 139 135 - 145 mmol/L   Potassium 4.8 3.5 - 5.1 mmol/L   Chloride 105 101 - 111 mmol/L   CO2 27  22 - 32 mmol/L   Glucose, Bld 105 (H) 65 - 99 mg/dL   BUN 23 (H) 6 - 20 mg/dL   Creatinine, Ser 0.79 0.44 - 1.00 mg/dL   Calcium 8.9 8.9 - 10.3 mg/dL   GFR calc non Af Amer >60 >60 mL/min   GFR calc Af Amer >60 >60 mL/min    Comment: (NOTE) The eGFR has been calculated using the CKD EPI equation. This calculation has not been validated in all clinical situations. eGFR's persistently <60 mL/min signify possible Chronic Kidney Disease.    Anion gap 7 5 - 15  Troponin I     Status: Abnormal   Collection Time: 02/03/15 11:10 PM  Result Value Ref Range   Troponin I 3.87 (HH) <0.031 ng/mL    Comment:        POSSIBLE MYOCARDIAL ISCHEMIA. SERIAL TESTING RECOMMENDED. CRITICAL RESULT CALLED TO, READ BACK BY AND VERIFIED WITH: DOSTER,T/ED _0  ON 02/03/15 BY KARCZEWSKI,S.   Brain natriuretic peptide     Status: Abnormal   Collection Time: 02/03/15 11:10 PM  Result Value Ref Range   B Natriuretic Peptide 4003.4 (H) 0.0 - 100.0 pg/mL  I-Stat CG4 Lactic Acid, ED     Status: None  Collection Time: 02/04/15  2:02 AM  Result Value Ref Range   Lactic Acid, Venous 1.43 0.5 - 2.0 mmol/L  Troponin I     Status: Abnormal   Collection Time: 02/04/15  3:59 AM  Result Value Ref Range   Troponin I 3.83 (HH) <0.031 ng/mL    Comment:        POSSIBLE MYOCARDIAL ISCHEMIA. SERIAL TESTING RECOMMENDED. CRITICAL RESULT CALLED TO, READ BACK BY AND VERIFIED WITH: FAIRING T RN 11/11/6 0719 COSTELLO B   Magnesium     Status: None   Collection Time: 02/04/15  3:59 AM  Result Value Ref Range   Magnesium 1.9 1.7 - 2.4 mg/dL    Imaging: Imaging results have been reviewed  Tele- NSR  Assessment/Plan:   1. Active Problems: 2.   Acute systolic CHF (congestive heart failure) ([facility]) 3.   Acute on chronic systolic (congestive) heart failure ([place]) 4.   Time Spent Directly with Patient:  20 minutes  Length of Stay:  LOS: 0 days   Pt admitted with vol overload. H/O NISCM. She is a pt of Dr  Claris Gladden and already has an OP appointment next Tues. Her BNP is 4000 and she has elevated JVP with minimal periph edema. Getting IV lasix BIB with I/O neg  700 cc (on PO torsemide at home). She is in NSR on Amio and Eliquis. Have discussed with Dr Aundra Dubin who will see pt on the IP CHF service.  Quay Burow 02/04/2015, 10:44 AM

## 2015-02-04 NOTE — ED Notes (Signed)
[facility] arrived to transport patient.  

## 2015-02-04 NOTE — ED Notes (Signed)
Receiving unit notified of patient departure with [facility].

## 2015-02-04 NOTE — ED Notes (Signed)
MD at bedside, unable to administer medications at this time. Will cont to monitor for MD exit.

## 2015-02-04 NOTE — Progress Notes (Signed)
Pt admitted from [facility] ED, pt a/o no c/o pain, pt DOE, pt oob ad lib, oriented to unit, VSS, pt stable

## 2015-02-04 NOTE — ED Notes (Signed)
Attempted to call report, nurse unavailable, will call back.

## 2015-02-04 NOTE — ED Notes (Signed)
Cardiology at bedside.

## 2015-02-05 DIAGNOSIS — I319 Disease of pericardium, unspecified: Secondary | ICD-10-CM

## 2015-02-05 LAB — BASIC METABOLIC PANEL
Anion gap: 10 (ref 5–15)
BUN: 28 mg/dL — AB (ref 6–20)
CHLORIDE: 101 mmol/L (ref 101–111)
CO2: 27 mmol/L (ref 22–32)
CREATININE: 1.07 mg/dL — AB (ref 0.44–1.00)
Calcium: 8.8 mg/dL — ABNORMAL LOW (ref 8.9–10.3)
GFR calc Af Amer: >60 mL/min (ref >60)
GFR calc non Af Amer: 52 mL/min — ABNORMAL LOW (ref >60)
GLUCOSE: 109 mg/dL — AB (ref 65–99)
POTASSIUM: 3.9 mmol/L (ref 3.5–5.1)
SODIUM: 138 mmol/L (ref 135–145)

## 2015-02-05 LAB — HEPATIC FUNCTION PANEL
ALK PHOS: 138 U/L — AB (ref 38–126)
ALT: 37 U/L (ref 14–54)
AST: 45 U/L — ABNORMAL HIGH (ref 15–41)
Albumin: 3 g/dL — ABNORMAL LOW (ref 3.5–5.0)
BILIRUBIN DIRECT: 0.2 mg/dL (ref 0.1–0.5)
BILIRUBIN INDIRECT: 0.9 mg/dL (ref 0.3–0.9)
TOTAL PROTEIN: 6.6 g/dL (ref 6.5–8.1)
Total Bilirubin: 1.1 mg/dL (ref 0.3–1.2)

## 2015-02-05 LAB — TROPONIN I: TROPONIN I: 2.03 ng/mL — AB (ref <0.031)

## 2015-02-05 MED ORDER — DIGOXIN 125 MCG PO TABS
0.0625 mg | ORAL_TABLET | Freq: Every day | ORAL | Status: DC
  Administered 2015-02-05 – 2015-02-09 (×5): 0.0625 mg via ORAL
  Filled 2015-02-05 (×5): qty 1

## 2015-02-05 MED ORDER — SPIRONOLACTONE 25 MG PO TABS
12.5000 mg | ORAL_TABLET | Freq: Every day | ORAL | Status: DC
  Administered 2015-02-05 – 2015-02-09 (×4): 12.5 mg via ORAL
  Filled 2015-02-05 (×4): qty 1

## 2015-02-05 NOTE — Progress Notes (Signed)
Subjective:   Diuresing well with lasix but still dyspneic and fatigued/ Echo with EF 10% and severe RV dysfunction. Recurrent moderate pericardial effusion.    Objective:  Temp:  [97.5 F (36.4 C)-97.8 F (36.6 C)] 97.6 F (36.4 C) (11/12 1048) Pulse Rate:  [66-80] 72 (11/12 1048) Resp:  [18-20] 18 (11/12 1048) BP: (102-132)/(73-88) 102/78 mmHg (11/12 1048) SpO2:  [96 %-100 %] 100 % (11/12 1048) Weight:  [43.954 kg (96 lb 14.4 oz)] 43.954 kg (96 lb 14.4 oz) (11/12 0347) Weight change: -2.359 kg (-5 lb 3.2 oz)  Intake/Output from previous day: 11/11 0701 - 11/12 0700 In: 600 [P.O.:600] Out: 2750 [Urine:2750]  Intake/Output from this shift: Total I/O In: 360 [P.O.:360] Out: -   Physical Exam: General:  Elderly frail  No resp difficulty HEENT: normal Neck: supple. JVP to jaw Carotids 2+ bilat; no bruits. No lymphadenopathy or thryomegaly appreciated. Cor: PMI laterally displaced. Regular rate & rhythm. 2/6 MR/TR + s3 Lungs: clear Abdomen: soft, nontender, nondistended. No hepatosplenomegaly. No bruits or masses. Good bowel sounds. Extremities: no cyanosis, clubbing, rash, 1+ edema Neuro: alert & orientedx3, cranial nerves grossly intact. moves all 4 extremities w/o difficulty. Affect pleasant   Lab Results: Results for orders placed or performed during the hospital encounter of 02/03/15 (from the past 48 hour(s))  CBC with Differential/Platelet     Status: Abnormal   Collection Time: 02/03/15 11:10 PM  Result Value Ref Range   WBC 7.8 4.0 - 10.5 K/uL   RBC 4.60 3.87 - 5.11 MIL/uL   Hemoglobin 12.9 12.0 - 15.0 g/dL   HCT 40.0 36.0 - 46.0 %   MCV 87.0 78.0 - 100.0 fL   MCH 28.0 26.0 - 34.0 pg   MCHC 32.3 30.0 - 36.0 g/dL   RDW 17.4 (H) 11.5 - 15.5 %   Platelets 232 150 - 400 K/uL   Neutrophils Relative % 76 %   Neutro Abs 6.0 1.7 - 7.7 K/uL   Lymphocytes Relative 15 %   Lymphs Abs 1.2 0.7 - 4.0 K/uL   Monocytes Relative 9 %   Monocytes Absolute 0.7 0.1 -  1.0 K/uL   Eosinophils Relative 0 %   Eosinophils Absolute 0.0 0.0 - 0.7 K/uL   Basophils Relative 0 %   Basophils Absolute 0.0 0.0 - 0.1 K/uL  Basic metabolic panel     Status: Abnormal   Collection Time: 02/03/15 11:10 PM  Result Value Ref Range   Sodium 139 135 - 145 mmol/L   Potassium 4.8 3.5 - 5.1 mmol/L   Chloride 105 101 - 111 mmol/L   CO2 27 22 - 32 mmol/L   Glucose, Bld 105 (H) 65 - 99 mg/dL   BUN 23 (H) 6 - 20 mg/dL   Creatinine, Ser 0.79 0.44 - 1.00 mg/dL   Calcium 8.9 8.9 - 10.3 mg/dL   GFR calc non Af Amer >60 >60 mL/min   GFR calc Af Amer >60 >60 mL/min    Comment: (NOTE) The eGFR has been calculated using the CKD EPI equation. This calculation has not been validated in all clinical situations. eGFR's persistently <60 mL/min signify possible Chronic Kidney Disease.    Anion gap 7 5 - 15  Troponin I     Status: Abnormal   Collection Time: 02/03/15 11:10 PM  Result Value Ref Range   Troponin I 3.87 (HH) <0.031 ng/mL    Comment:        POSSIBLE MYOCARDIAL ISCHEMIA. SERIAL TESTING RECOMMENDED. CRITICAL RESULT CALLED  TO, READ BACK BY AND VERIFIED WITH: DOSTER,T/ED @2347  ON 02/03/15 BY KARCZEWSKI,S.   Brain natriuretic peptide     Status: Abnormal   Collection Time: 02/03/15 11:10 PM  Result Value Ref Range   B Natriuretic Peptide 4003.4 (H) 0.0 - 100.0 pg/mL  I-Stat CG4 Lactic Acid, ED     Status: None   Collection Time: 02/04/15  2:02 AM  Result Value Ref Range   Lactic Acid, Venous 1.43 0.5 - 2.0 mmol/L  Troponin I     Status: Abnormal   Collection Time: 02/04/15  3:59 AM  Result Value Ref Range   Troponin I 3.83 (HH) <0.031 ng/mL    Comment:        POSSIBLE MYOCARDIAL ISCHEMIA. SERIAL TESTING RECOMMENDED. CRITICAL RESULT CALLED TO, READ BACK BY AND VERIFIED WITH: FAIRING T RN 11/11/6 0719 COSTELLO B   Magnesium     Status: None   Collection Time: 02/04/15  3:59 AM  Result Value Ref Range   Magnesium 1.9 1.7 - 2.4 mg/dL  Troponin I     Status:  Abnormal   Collection Time: 02/04/15 10:10 AM  Result Value Ref Range   Troponin I 2.55 (HH) <0.031 ng/mL    Comment:        POSSIBLE MYOCARDIAL ISCHEMIA. SERIAL TESTING RECOMMENDED. CRITICAL VALUE NOTED.  VALUE IS CONSISTENT WITH PREVIOUSLY REPORTED AND CALLED VALUE.   Troponin I     Status: Abnormal   Collection Time: 02/04/15  4:20 PM  Result Value Ref Range   Troponin I 2.28 (HH) <0.031 ng/mL    Comment:        POSSIBLE MYOCARDIAL ISCHEMIA. SERIAL TESTING RECOMMENDED. CRITICAL VALUE NOTED.  VALUE IS CONSISTENT WITH PREVIOUSLY REPORTED AND CALLED VALUE.   Basic metabolic panel     Status: Abnormal   Collection Time: 02/04/15  4:20 PM  Result Value Ref Range   Sodium 138 135 - 145 mmol/L   Potassium 3.8 3.5 - 5.1 mmol/L   Chloride 102 101 - 111 mmol/L   CO2 27 22 - 32 mmol/L   Glucose, Bld 97 65 - 99 mg/dL   BUN 25 (H) 6 - 20 mg/dL   Creatinine, Ser 1.08 (H) 0.44 - 1.00 mg/dL   Calcium 9.0 8.9 - 10.3 mg/dL   GFR calc non Af Amer 52 (L) >60 mL/min   GFR calc Af Amer 60 (L) >60 mL/min    Comment: (NOTE) The eGFR has been calculated using the CKD EPI equation. This calculation has not been validated in all clinical situations. eGFR's persistently <60 mL/min signify possible Chronic Kidney Disease.    Anion gap 9 5 - 15  Basic metabolic panel     Status: Abnormal   Collection Time: 02/05/15  4:14 AM  Result Value Ref Range   Sodium 138 135 - 145 mmol/L   Potassium 3.9 3.5 - 5.1 mmol/L   Chloride 101 101 - 111 mmol/L   CO2 27 22 - 32 mmol/L   Glucose, Bld 109 (H) 65 - 99 mg/dL   BUN 28 (H) 6 - 20 mg/dL   Creatinine, Ser 1.07 (H) 0.44 - 1.00 mg/dL   Calcium 8.8 (L) 8.9 - 10.3 mg/dL   GFR calc non Af Amer 52 (L) >60 mL/min   GFR calc Af Amer >60 >60 mL/min    Comment: (NOTE) The eGFR has been calculated using the CKD EPI equation. This calculation has not been validated in all clinical situations. eGFR's persistently <60 mL/min signify possible Chronic  Kidney  Disease.    Anion gap 10 5 - 15  Hepatic function panel     Status: Abnormal   Collection Time: 02/05/15  4:14 AM  Result Value Ref Range   Total Protein 6.6 6.5 - 8.1 g/dL   Albumin 3.0 (L) 3.5 - 5.0 g/dL   AST 45 (H) 15 - 41 U/L   ALT 37 14 - 54 U/L   Alkaline Phosphatase 138 (H) 38 - 126 U/L   Total Bilirubin 1.1 0.3 - 1.2 mg/dL   Bilirubin, Direct 0.2 0.1 - 0.5 mg/dL   Indirect Bilirubin 0.9 0.3 - 0.9 mg/dL  Troponin I     Status: Abnormal   Collection Time: 02/05/15  4:14 AM  Result Value Ref Range   Troponin I 2.03 (HH) <0.031 ng/mL    Comment:        POSSIBLE MYOCARDIAL ISCHEMIA. SERIAL TESTING RECOMMENDED. CRITICAL VALUE NOTED.  VALUE IS CONSISTENT WITH PREVIOUSLY REPORTED AND CALLED VALUE.     Imaging: Imaging results have been reviewed  Tele- NSR  Assessment/Plan:   1. Acute on chronic systolic HF  --NICM with severe biventricular failure 2. Recurrent pericardial effusion s/p window 07/26/14 3. PAF --In NSR on amio 4. Elevated troponin --Normal cors on cath 4/16 5. Noncompliance  I have reviewed her echo images personally and she has very severe biventricular cardiomyopathy with a  Recurrent moderate effusion (no tamponade). Her myopathy appears end-stage. She remains with NYHA IV symptoms despite diuresis. Only option may be IV inotropes but this is complicated by h/o noncompliance. I discussed the possibility of repeat RHC with her and home inotropes and she is willing to consider. Tentatively will plan cath Monday with Dr. Aundra Dubin. SW to see. Can consider low-dose digoxin as well.    Bensimhon, Daniel,MD 10:57 AM

## 2015-02-06 DIAGNOSIS — E876 Hypokalemia: Secondary | ICD-10-CM

## 2015-02-06 LAB — BASIC METABOLIC PANEL
ANION GAP: 9 (ref 5–15)
BUN: 21 mg/dL — ABNORMAL HIGH (ref 6–20)
CALCIUM: 8.3 mg/dL — AB (ref 8.9–10.3)
CO2: 27 mmol/L (ref 22–32)
Chloride: 101 mmol/L (ref 101–111)
Creatinine, Ser: 0.88 mg/dL (ref 0.44–1.00)
GFR calc non Af Amer: >60 mL/min (ref >60)
Glucose, Bld: 86 mg/dL (ref 65–99)
Potassium: 3 mmol/L — ABNORMAL LOW (ref 3.5–5.1)
Sodium: 137 mmol/L (ref 135–145)

## 2015-02-06 MED ORDER — SODIUM CHLORIDE 0.9 % IJ SOLN
3.0000 mL | Freq: Two times a day (BID) | INTRAMUSCULAR | Status: DC
  Administered 2015-02-06 – 2015-02-07 (×2): 3 mL via INTRAVENOUS

## 2015-02-06 MED ORDER — POTASSIUM CHLORIDE CRYS ER 20 MEQ PO TBCR
40.0000 meq | EXTENDED_RELEASE_TABLET | Freq: Every day | ORAL | Status: DC
  Administered 2015-02-07 – 2015-02-09 (×3): 40 meq via ORAL
  Filled 2015-02-06 (×5): qty 2

## 2015-02-06 MED ORDER — SODIUM CHLORIDE 0.9 % IV SOLN: INTRAVENOUS | Status: DC

## 2015-02-06 MED ORDER — SODIUM CHLORIDE 0.9 % IJ SOLN: 3.0000 mL | INTRAMUSCULAR | Status: DC | PRN

## 2015-02-06 MED ORDER — TORSEMIDE 20 MG PO TABS
20.0000 mg | ORAL_TABLET | Freq: Two times a day (BID) | ORAL | Status: DC
  Administered 2015-02-06 – 2015-02-08 (×3): 20 mg via ORAL
  Filled 2015-02-06 (×3): qty 1

## 2015-02-06 MED ORDER — SODIUM CHLORIDE 0.9 % IV SOLN: 250.0000 mL | INTRAVENOUS | Status: DC | PRN

## 2015-02-06 MED ORDER — POTASSIUM CHLORIDE CRYS ER 20 MEQ PO TBCR
40.0000 meq | EXTENDED_RELEASE_TABLET | Freq: Once | ORAL | Status: AC
  Administered 2015-02-06: 40 meq via ORAL
  Filled 2015-02-06: qty 2

## 2015-02-06 NOTE — Progress Notes (Signed)
Cardiology Progress Note   Subjective:   Feeling better. Still short of breath.  No chest pain.    Objective:  Temp:  [97.5 F (36.4 C)-97.6 F (36.4 C)] 97.5 F (36.4 C) (11/13 0535) Pulse Rate:  [61-81] 61 (11/13 0535) Resp:  [18] 18 (11/13 0535) BP: (100-108)/(61-78) 100/61 mmHg (11/13 0535) SpO2:  [100 %] 100 % (11/13 0535) Weight:  [91 lb 11.2 oz (41.595 kg)] 91 lb 11.2 oz (41.595 kg) (11/13 0535) Weight change: -5 lb 3.2 oz (-2.359 kg)  Intake/Output from previous day: 11/12 0701 - 11/13 0700 In: 900 [P.O.:900] Out: 1800 [Urine:1800]  Intake/Output from this shift:    Physical Exam: General:  Elderly frail  No resp difficulty HEENT: normal Neck: supple. JVP almost up to jaw   No  thryomegaly appreciated. Cor:  Regular rate & rhythm. 2/6 MR/TR   Lungs: clear Abdomen: soft, nontender, + HJR Extremities: no cyanosis, clubbing, rash, trace edema Neuro: alert & orientedx3, cranial nerves grossly intact. moves all 4 extremities w/o difficulty. Affect pleasant   Lab Results: Results for orders placed or performed during the hospital encounter of 02/03/15 (from the past 48 hour(s))  Troponin I     Status: Abnormal   Collection Time: 02/04/15 10:10 AM  Result Value Ref Range   Troponin I 2.55 (HH) <0.031 ng/mL    Comment:        POSSIBLE MYOCARDIAL ISCHEMIA. SERIAL TESTING RECOMMENDED. CRITICAL VALUE NOTED.  VALUE IS CONSISTENT WITH PREVIOUSLY REPORTED AND CALLED VALUE.   Troponin I     Status: Abnormal   Collection Time: 02/04/15  4:20 PM  Result Value Ref Range   Troponin I 2.28 (HH) <0.031 ng/mL    Comment:        POSSIBLE MYOCARDIAL ISCHEMIA. SERIAL TESTING RECOMMENDED. CRITICAL VALUE NOTED.  VALUE IS CONSISTENT WITH PREVIOUSLY REPORTED AND CALLED VALUE.   Basic metabolic panel     Status: Abnormal   Collection Time: 02/04/15  4:20 PM  Result Value Ref Range   Sodium 138 135 - 145 mmol/L   Potassium 3.8 3.5 - 5.1 mmol/L   Chloride 102 101 - 111  mmol/L   CO2 27 22 - 32 mmol/L   Glucose, Bld 97 65 - 99 mg/dL   BUN 25 (H) 6 - 20 mg/dL   Creatinine, Ser 1.08 (H) 0.44 - 1.00 mg/dL   Calcium 9.0 8.9 - 10.3 mg/dL   GFR calc non Af Amer 52 (L) >60 mL/min   GFR calc Af Amer 60 (L) >60 mL/min    Comment: (NOTE) The eGFR has been calculated using the CKD EPI equation. This calculation has not been validated in all clinical situations. eGFR's persistently <60 mL/min signify possible Chronic Kidney Disease.    Anion gap 9 5 - 15  Basic metabolic panel     Status: Abnormal   Collection Time: 02/05/15  4:14 AM  Result Value Ref Range   Sodium 138 135 - 145 mmol/L   Potassium 3.9 3.5 - 5.1 mmol/L   Chloride 101 101 - 111 mmol/L   CO2 27 22 - 32 mmol/L   Glucose, Bld 109 (H) 65 - 99 mg/dL   BUN 28 (H) 6 - 20 mg/dL   Creatinine, Ser 1.07 (H) 0.44 - 1.00 mg/dL   Calcium 8.8 (L) 8.9 - 10.3 mg/dL   GFR calc non Af Amer 52 (L) >60 mL/min   GFR calc Af Amer >60 >60 mL/min    Comment: (NOTE) The eGFR has been calculated  using the CKD EPI equation. This calculation has not been validated in all clinical situations. eGFR's persistently <60 mL/min signify possible Chronic Kidney Disease.    Anion gap 10 5 - 15  Hepatic function panel     Status: Abnormal   Collection Time: 02/05/15  4:14 AM  Result Value Ref Range   Total Protein 6.6 6.5 - 8.1 g/dL   Albumin 3.0 (L) 3.5 - 5.0 g/dL   AST 45 (H) 15 - 41 U/L   ALT 37 14 - 54 U/L   Alkaline Phosphatase 138 (H) 38 - 126 U/L   Total Bilirubin 1.1 0.3 - 1.2 mg/dL   Bilirubin, Direct 0.2 0.1 - 0.5 mg/dL   Indirect Bilirubin 0.9 0.3 - 0.9 mg/dL  Troponin I     Status: Abnormal   Collection Time: 02/05/15  4:14 AM  Result Value Ref Range   Troponin I 2.03 (HH) <0.031 ng/mL    Comment:        POSSIBLE MYOCARDIAL ISCHEMIA. SERIAL TESTING RECOMMENDED. CRITICAL VALUE NOTED.  VALUE IS CONSISTENT WITH PREVIOUSLY REPORTED AND CALLED VALUE.   Basic metabolic panel     Status: Abnormal    Collection Time: 02/06/15  3:26 AM  Result Value Ref Range   Sodium 137 135 - 145 mmol/L   Potassium 3.0 (L) 3.5 - 5.1 mmol/L    Comment: DELTA CHECK NOTED   Chloride 101 101 - 111 mmol/L   CO2 27 22 - 32 mmol/L   Glucose, Bld 86 65 - 99 mg/dL   BUN 21 (H) 6 - 20 mg/dL   Creatinine, Ser 0.88 0.44 - 1.00 mg/dL   Calcium 8.3 (L) 8.9 - 10.3 mg/dL   GFR calc non Af Amer >60 >60 mL/min   GFR calc Af Amer >60 >60 mL/min    Comment: (NOTE) The eGFR has been calculated using the CKD EPI equation. This calculation has not been validated in all clinical situations. eGFR's persistently <60 mL/min signify possible Chronic Kidney Disease.    Anion gap 9 5 - 15    Imaging: Dg Chest 2 View  02/03/2015  CLINICAL DATA:  Shortness of breath for 1 month EXAM: CHEST  2 VIEW COMPARISON:  November 11, 2014 FINDINGS: The mediastinal contour is normal. There is marked cardiomegaly. There is a small left pleural effusion and small mole to moderate right pleural effusion. There is no pulmonary edema or focal pneumonia. The visualized skeletal structures are stable. IMPRESSION: Small left pleural effusion, small to moderate right pleural effusion. No pulmonary edema. Marked cardiomegaly. Electronically Signed   By: Abelardo Diesel M.D.   On: 02/03/2015 21:20     Tele- NSR, PVCs  Assessment/Plan:   1. Acute on chronic systolic HF  --NICM with severe biventricular failure.  EF 10-15% by echo this admit.  PASP 58.  Severely reduce RVSF.3  --Neg 3 L net since admit  --Wt 102 >> 91 lbs today 2. Recurrent pericardial effusion s/p window 07/26/14 - mod effusion by echo this admit 3. PAF --In NSR on amio, Eliquis 4. Elevated troponin --Normal cors on cath 4/16 5. Noncompliance 6. Hypokalemia   Echo images reviewed by Dr. Glori Bickers and patient noted to have very severe biventricular cardiomyopathy with a recurrent moderate effusion (no tamponade). Her myopathy appears end-stage. She remains with NYHA IV  symptoms despite diuresis. Only option may be IV inotropes but this is complicated by h/o noncompliance.  Possibility of repeat RHC and home inotropes discussed with her yesterday and she is  willing to consider. Tentatively will plan R heart cath Monday with Dr. Aundra Dubin. Orders written.    Down 11 lbs since admit.  Replace K+ (give 2 doses of K+ 40 mEq today and schedule K+ 40 mEq QD starting tomorrow). Check BMET in AM.  Volume status appears improved.  Renal function remains stable.  Consider decreasing Lasix to 40 IV twice daily soon.   Signed,  Richardson Dopp, PA-C   02/06/2015 7:34 AM    Patient seen and examined with Richardson Dopp, PA-C. We discussed all aspects of the encounter. I agree with the assessment and plan as stated above.   Mildly improved today. Weight down to 91 pounds. Breathing better. Still appears low output. Will switch to po demadex. Supp K+.   She is willing to consider home inotropes. Possible RHC tomorrow with Dr. Aundra Dubin.   Aubrionna Istre,MD 12:43 PM

## 2015-02-07 ENCOUNTER — Encounter (HOSPITAL_COMMUNITY): Payer: Self-pay | Admitting: Cardiology

## 2015-02-07 ENCOUNTER — Encounter (HOSPITAL_COMMUNITY)
Admission: EM | Disposition: A | Discharge status: Home / Self Care | Payer: Self-pay | Source: Home / Self Care | Attending: Cardiology

## 2015-02-07 DIAGNOSIS — I313 Pericardial effusion (noninflammatory): Secondary | ICD-10-CM

## 2015-02-07 DIAGNOSIS — I480 Paroxysmal atrial fibrillation: Secondary | ICD-10-CM

## 2015-02-07 DIAGNOSIS — I509 Heart failure, unspecified: Secondary | ICD-10-CM

## 2015-02-07 HISTORY — PX: CARDIAC CATHETERIZATION: SHX172

## 2015-02-07 LAB — POCT I-STAT 3, VENOUS BLOOD GAS (G3P V)
ACID-BASE EXCESS: 2 mmol/L (ref 0.0–2.0)
ACID-BASE EXCESS: 3 mmol/L — AB (ref 0.0–2.0)
Acid-Base Excess: 3 mmol/L — ABNORMAL HIGH (ref 0.0–2.0)
Acid-Base Excess: 4 mmol/L — ABNORMAL HIGH (ref 0.0–2.0)
BICARBONATE: 27 meq/L — AB (ref 20.0–24.0)
BICARBONATE: 28.2 meq/L — AB (ref 20.0–24.0)
BICARBONATE: 28.2 meq/L — AB (ref 20.0–24.0)
BICARBONATE: 29 meq/L — AB (ref 20.0–24.0)
O2 SAT: 68 %
O2 SAT: 68 %
O2 SAT: 70 %
O2 Saturation: 69 %
PCO2 VEN: 42.2 mmHg — AB (ref 45.0–50.0)
PCO2 VEN: 43.1 mmHg — AB (ref 45.0–50.0)
PCO2 VEN: 43.7 mmHg — AB (ref 45.0–50.0)
PO2 VEN: 35 mmHg (ref 30.0–45.0)
PO2 VEN: 35 mmHg (ref 30.0–45.0)
PO2 VEN: 35 mmHg (ref 30.0–45.0)
TCO2: 29 mmol/L (ref 0–100)
TCO2: 30 mmol/L (ref 0–100)
TCO2: 30 mmol/L (ref 0–100)
TCO2: 28 mmol/L (ref 0–100)
pCO2, Ven: 42.4 mmHg — ABNORMAL LOW (ref 45.0–50.0)
pH, Ven: 7.414 — ABNORMAL HIGH (ref 7.250–7.300)
pH, Ven: 7.417 — ABNORMAL HIGH (ref 7.250–7.300)
pH, Ven: 7.432 — ABNORMAL HIGH (ref 7.250–7.300)
pH, Ven: 7.435 — ABNORMAL HIGH (ref 7.250–7.300)
pO2, Ven: 36 mmHg (ref 30.0–45.0)

## 2015-02-07 LAB — BASIC METABOLIC PANEL
Anion gap: 7 (ref 5–15)
BUN: 24 mg/dL — ABNORMAL HIGH (ref 6–20)
CHLORIDE: 104 mmol/L (ref 101–111)
CO2: 26 mmol/L (ref 22–32)
CREATININE: 0.88 mg/dL (ref 0.44–1.00)
Calcium: 8.6 mg/dL — ABNORMAL LOW (ref 8.9–10.3)
GFR calc non Af Amer: >60 mL/min (ref >60)
Glucose, Bld: 88 mg/dL (ref 65–99)
POTASSIUM: 4.1 mmol/L (ref 3.5–5.1)
Sodium: 137 mmol/L (ref 135–145)

## 2015-02-07 LAB — MRSA PCR SCREENING: MRSA by PCR: NEGATIVE

## 2015-02-07 SURGERY — RIGHT HEART CATH

## 2015-02-07 MED ORDER — FENTANYL CITRATE (PF) 100 MCG/2ML IJ SOLN
INTRAMUSCULAR | Status: AC
  Filled 2015-02-07: qty 4

## 2015-02-07 MED ORDER — APIXABAN 5 MG PO TABS
5.0000 mg | ORAL_TABLET | Freq: Two times a day (BID) | ORAL | Status: DC
  Administered 2015-02-08 – 2015-02-09 (×3): 5 mg via ORAL
  Filled 2015-02-07 (×3): qty 1

## 2015-02-07 MED ORDER — SODIUM CHLORIDE 0.9 % IJ SOLN: 3.0000 mL | INTRAMUSCULAR | Status: DC | PRN

## 2015-02-07 MED ORDER — SODIUM CHLORIDE 0.9 % IJ SOLN: 3.0000 mL | Freq: Two times a day (BID) | INTRAMUSCULAR | Status: DC

## 2015-02-07 MED ORDER — HEPARIN (PORCINE) IN NACL 2-0.9 UNIT/ML-% IJ SOLN
INTRAMUSCULAR | Status: AC
  Filled 2015-02-07: qty 500

## 2015-02-07 MED ORDER — SODIUM CHLORIDE 0.9 % IV SOLN: 250.0000 mL | INTRAVENOUS | Status: DC | PRN

## 2015-02-07 MED ORDER — MIDAZOLAM HCL 2 MG/2ML IJ SOLN
INTRAMUSCULAR | Status: DC | PRN
  Administered 2015-02-07: 0.5 mg via INTRAVENOUS

## 2015-02-07 MED ORDER — LIDOCAINE HCL (PF) 1 % IJ SOLN
INTRAMUSCULAR | Status: AC
  Filled 2015-02-07: qty 30

## 2015-02-07 MED ORDER — SODIUM CHLORIDE 0.9 % IJ SOLN
3.0000 mL | Freq: Two times a day (BID) | INTRAMUSCULAR | Status: DC
  Administered 2015-02-07 – 2015-02-09 (×3): 3 mL via INTRAVENOUS

## 2015-02-07 MED ORDER — FENTANYL CITRATE (PF) 100 MCG/2ML IJ SOLN
INTRAMUSCULAR | Status: DC | PRN
  Administered 2015-02-07: 25 ug via INTRAVENOUS

## 2015-02-07 MED ORDER — MIDAZOLAM HCL 2 MG/2ML IJ SOLN
INTRAMUSCULAR | Status: AC
  Filled 2015-02-07: qty 4

## 2015-02-07 MED ORDER — ONDANSETRON HCL 4 MG/2ML IJ SOLN: 4.0000 mg | Freq: Four times a day (QID) | INTRAMUSCULAR | Status: DC | PRN

## 2015-02-07 MED ORDER — HEPARIN (PORCINE) IN NACL 2-0.9 UNIT/ML-% IJ SOLN
INTRAMUSCULAR | Status: DC | PRN
  Administered 2015-02-07: 14:00:00

## 2015-02-07 MED ORDER — MILRINONE IN DEXTROSE 20 MG/100ML IV SOLN
0.2500 ug/kg/min | INTRAVENOUS | Status: DC
  Administered 2015-02-07: 0.125 ug/kg/min via INTRAVENOUS
  Administered 2015-02-09: 0.25 ug/kg/min via INTRAVENOUS
  Filled 2015-02-07: qty 100

## 2015-02-07 MED ORDER — ACETAMINOPHEN 325 MG PO TABS: 650.0000 mg | ORAL_TABLET | ORAL | Status: DC | PRN

## 2015-02-07 SURGICAL SUPPLY — 11 items
CATH SWAN GANZ 7F STRAIGHT (CATHETERS) ×2 IMPLANT
COVER PRB 48X5XTLSCP FOLD TPE (BAG) IMPLANT
COVER PROBE 5X48 (BAG)
GUIDEWIRE .025 260CM (WIRE) ×2 IMPLANT
PACK CARDIAC CATHETERIZATION (CUSTOM PROCEDURE TRAY) ×3 IMPLANT
PROTECTION STATION PRESSURIZED (MISCELLANEOUS) ×3
SHEATH PINNACLE 7F 10CM (SHEATH) ×2 IMPLANT
STATION PROTECTION PRESSURIZED (MISCELLANEOUS) IMPLANT
TRANSDUCER W/STOPCOCK (MISCELLANEOUS) ×4 IMPLANT
TUBING ART PRESS 72  MALE/FEM (TUBING) ×2
TUBING ART PRESS 72 MALE/FEM (TUBING) IMPLANT

## 2015-02-07 NOTE — Care Management Note (Signed)
Case Management Note  Patient Details  Name: Katrina Peck MRN: [id number] Date of Birth: 14-Jun-1945  Subjective/Objective:     Pt transf to sdu w heart failure               Action/Plan: lives w fam   Expected Discharge Date:                  Expected Discharge Plan:     In-House Referral:     Discharge planning Services     Post Acute Care Choice:    Choice offered to:     DME Arranged:    DME Agency:     HH Arranged:    HH Agency:     Status of Service:     Medicare Important Message Given:    Date Medicare IM Given:    Medicare IM give by:    Date Additional Medicare IM Given:    Additional Medicare Important Message give by:     If discussed at Long Length of Stay Meetings, dates discussed:    Additional Comments: will moniter for dc needs as pt progresses. May need hhc.  Hanley Hays, RN 02/07/2015, 2:10 PM

## 2015-02-07 NOTE — Progress Notes (Signed)
Patient ID: Katrina Peck, female   DOB: 08/13/1945, 69 y.o.   MRN: [id number]   Cardiology Progress Note   Subjective:   Still feels weak.  Now on po torsemide. Breathing ok.     Objective:  Temp:  [97.3 F (36.3 C)-97.6 F (36.4 C)] 97.6 F (36.4 C) (11/14 0800) Pulse Rate:  [69-96] 71 (11/14 0800) Resp:  [16-20] 18 (11/14 0700) BP: (92-100)/(65-75) 100/69 mmHg (11/14 0800) SpO2:  [98 %-100 %] 100 % (11/14 0800) Weight:  [93 lb (42.185 kg)] 93 lb (42.185 kg) (11/14 0700) Weight change: 1 lb 4.8 oz (0.59 kg)  Intake/Output from previous day: 11/13 0701 - 11/14 0700 In: 1080 [P.O.:1080] Out: 2502 [Urine:2500; Stool:2]  Intake/Output from this shift:    Physical Exam: General:  Elderly frail  No resp difficulty HEENT: normal Neck: supple. JVP 7-8 cm.  No  thryomegaly appreciated. Cor:  Regular rate & rhythm. 2/6 MR/TR   Lungs: clear Abdomen: soft, nontender, no HSM Extremities: no cyanosis, clubbing, rash, no edema.  Neuro: alert & orientedx3, cranial nerves grossly intact. moves all 4 extremities w/o difficulty. Affect pleasant  Lab Results: Results for orders placed or performed during the hospital encounter of 02/03/15 (from the past 48 hour(s))  Basic metabolic panel     Status: Abnormal   Collection Time: 02/06/15  3:26 AM  Result Value Ref Range   Sodium 137 135 - 145 mmol/L   Potassium 3.0 (L) 3.5 - 5.1 mmol/L    Comment: DELTA CHECK NOTED   Chloride 101 101 - 111 mmol/L   CO2 27 22 - 32 mmol/L   Glucose, Bld 86 65 - 99 mg/dL   BUN 21 (H) 6 - 20 mg/dL   Creatinine, Ser 0.88 0.44 - 1.00 mg/dL   Calcium 8.3 (L) 8.9 - 10.3 mg/dL   GFR calc non Af Amer >60 >60 mL/min   GFR calc Af Amer >60 >60 mL/min    Comment: (NOTE) The eGFR has been calculated using the CKD EPI equation. This calculation has not been validated in all clinical situations. eGFR's persistently <60 mL/min signify possible Chronic Kidney Disease.    Anion gap 9 5 - 15  Basic metabolic  panel     Status: Abnormal   Collection Time: 02/07/15  4:36 AM  Result Value Ref Range   Sodium 137 135 - 145 mmol/L   Potassium 4.1 3.5 - 5.1 mmol/L    Comment: DELTA CHECK NOTED   Chloride 104 101 - 111 mmol/L   CO2 26 22 - 32 mmol/L   Glucose, Bld 88 65 - 99 mg/dL   BUN 24 (H) 6 - 20 mg/dL   Creatinine, Ser 0.88 0.44 - 1.00 mg/dL   Calcium 8.6 (L) 8.9 - 10.3 mg/dL   GFR calc non Af Amer >60 >60 mL/min   GFR calc Af Amer >60 >60 mL/min    Comment: (NOTE) The eGFR has been calculated using the CKD EPI equation. This calculation has not been validated in all clinical situations. eGFR's persistently <60 mL/min signify possible Chronic Kidney Disease.    Anion gap 7 5 - 15    Imaging: Dg Chest 2 View  02/03/2015  CLINICAL DATA:  Shortness of breath for 1 month EXAM: CHEST  2 VIEW COMPARISON:  November 11, 2014 FINDINGS: The mediastinal contour is normal. There is marked cardiomegaly. There is a small left pleural effusion and small mole to moderate right pleural effusion. There is no pulmonary edema or focal pneumonia. The visualized  skeletal structures are stable. IMPRESSION: Small left pleural effusion, small to moderate right pleural effusion. No pulmonary edema. Marked cardiomegaly. Electronically Signed   By: Wei-Chen  Lin M.D.   On: 02/03/2015 21:20     Tele- NSR, PVCs  Assessment/Plan:   1. Acute on chronic systolic HF: NICM with severe biventricular failure.  Noncompliant with meds at home.  EF 10-15% by echo this admit, PASP 58, severely dilated RV with severely reduced systolic function.  Volume status looks ok at this point with diuresis but concerned about low output.  - Continue digoxin, check trough in am.  - Continue po torsemide 20 bid.  - RHC today, will plan home milrinone if output low.  Hold Eliquis today.  - Suspect poor candidate for advanced therapies => has had poor compliance historically.  Also has severe RV dysfunction which is concerning for LVAD. - Wide  LBBB, has not been interested in CRT in past, may be too little too late at this point.   2. Recurrent pericardial effusion s/p window 07/26/14:  mod effusion by echo this admit 3. PAF: In NSR on amio, Eliquis.  Eliquis held for RHC, restart tomorrow.  4. Elevated troponin: Normal cors on cath 4/16, suspect demand ischemia with volume overload.  5. Noncompliance 6. Hypokalemia: Ok today.   Shambhavi Salley,MD 02/07/2015  8:42 AM    

## 2015-02-07 NOTE — H&P (View-Only) (Signed)
Patient ID: Katrina Peck, female   DOB: 08/13/1945, 69 y.o.   MRN: [id number]   Cardiology Progress Note   Subjective:   Still feels weak.  Now on po torsemide. Breathing ok.     Objective:  Temp:  [97.3 F (36.3 C)-97.6 F (36.4 C)] 97.6 F (36.4 C) (11/14 0800) Pulse Rate:  [69-96] 71 (11/14 0800) Resp:  [16-20] 18 (11/14 0700) BP: (92-100)/(65-75) 100/69 mmHg (11/14 0800) SpO2:  [98 %-100 %] 100 % (11/14 0800) Weight:  [93 lb (42.185 kg)] 93 lb (42.185 kg) (11/14 0700) Weight change: 1 lb 4.8 oz (0.59 kg)  Intake/Output from previous day: 11/13 0701 - 11/14 0700 In: 1080 [P.O.:1080] Out: 2502 [Urine:2500; Stool:2]  Intake/Output from this shift:    Physical Exam: General:  Elderly frail  No resp difficulty HEENT: normal Neck: supple. JVP 7-8 cm.  No  thryomegaly appreciated. Cor:  Regular rate & rhythm. 2/6 MR/TR   Lungs: clear Abdomen: soft, nontender, no HSM Extremities: no cyanosis, clubbing, rash, no edema.  Neuro: alert & orientedx3, cranial nerves grossly intact. moves all 4 extremities w/o difficulty. Affect pleasant  Lab Results: Results for orders placed or performed during the hospital encounter of 02/03/15 (from the past 48 hour(s))  Basic metabolic panel     Status: Abnormal   Collection Time: 02/06/15  3:26 AM  Result Value Ref Range   Sodium 137 135 - 145 mmol/L   Potassium 3.0 (L) 3.5 - 5.1 mmol/L    Comment: DELTA CHECK NOTED   Chloride 101 101 - 111 mmol/L   CO2 27 22 - 32 mmol/L   Glucose, Bld 86 65 - 99 mg/dL   BUN 21 (H) 6 - 20 mg/dL   Creatinine, Ser 0.88 0.44 - 1.00 mg/dL   Calcium 8.3 (L) 8.9 - 10.3 mg/dL   GFR calc non Af Amer >60 >60 mL/min   GFR calc Af Amer >60 >60 mL/min    Comment: (NOTE) The eGFR has been calculated using the CKD EPI equation. This calculation has not been validated in all clinical situations. eGFR's persistently <60 mL/min signify possible Chronic Kidney Disease.    Anion gap 9 5 - 15  Basic metabolic  panel     Status: Abnormal   Collection Time: 02/07/15  4:36 AM  Result Value Ref Range   Sodium 137 135 - 145 mmol/L   Potassium 4.1 3.5 - 5.1 mmol/L    Comment: DELTA CHECK NOTED   Chloride 104 101 - 111 mmol/L   CO2 26 22 - 32 mmol/L   Glucose, Bld 88 65 - 99 mg/dL   BUN 24 (H) 6 - 20 mg/dL   Creatinine, Ser 0.88 0.44 - 1.00 mg/dL   Calcium 8.6 (L) 8.9 - 10.3 mg/dL   GFR calc non Af Amer >60 >60 mL/min   GFR calc Af Amer >60 >60 mL/min    Comment: (NOTE) The eGFR has been calculated using the CKD EPI equation. This calculation has not been validated in all clinical situations. eGFR's persistently <60 mL/min signify possible Chronic Kidney Disease.    Anion gap 7 5 - 15    Imaging: Dg Chest 2 View  02/03/2015  CLINICAL DATA:  Shortness of breath for 1 month EXAM: CHEST  2 VIEW COMPARISON:  November 11, 2014 FINDINGS: The mediastinal contour is normal. There is marked cardiomegaly. There is a small left pleural effusion and small mole to moderate right pleural effusion. There is no pulmonary edema or focal pneumonia. The visualized  skeletal structures are stable. IMPRESSION: Small left pleural effusion, small to moderate right pleural effusion. No pulmonary edema. Marked cardiomegaly. Electronically Signed   By: Abelardo Diesel M.D.   On: 02/03/2015 21:20     Tele- NSR, PVCs  Assessment/Plan:   1. Acute on chronic systolic HF: NICM with severe biventricular failure.  Noncompliant with meds at home.  EF 10-15% by echo this admit, PASP 58, severely dilated RV with severely reduced systolic function.  Volume status looks ok at this point with diuresis but concerned about low output.  - Continue digoxin, check trough in am.  - Continue po torsemide 20 bid.  - RHC today, will plan home milrinone if output low.  Hold Eliquis today.  - Suspect poor candidate for advanced therapies => has had poor compliance historically.  Also has severe RV dysfunction which is concerning for LVAD. - Wide  LBBB, has not been interested in CRT in past, may be too little too late at this point.   2. Recurrent pericardial effusion s/p window 07/26/14:  mod effusion by echo this admit 3. PAF: In NSR on amio, Eliquis.  Eliquis held for RHC, restart tomorrow.  4. Elevated troponin: Normal cors on cath 4/16, suspect demand ischemia with volume overload.  5. Noncompliance 6. Hypokalemia: Ok today.   Dalton McLean,MD 02/07/2015  8:42 AM

## 2015-02-07 NOTE — Progress Notes (Signed)
IV team could not get to Pic[facility] line tonight will be done in AM.  Needed for discharge on IV milrinone but medication is going now.

## 2015-02-07 NOTE — Interval H&P Note (Signed)
History and Physical Interval Note:  02/07/2015 12:51 PM  Katrina Peck  has presented today for surgery, with the diagnosis of hf  The various methods of treatment have been discussed with the patient and family. After consideration of risks, benefits and other options for treatment, the patient has consented to  Procedure(s): Right Heart Cath (N/A) as a surgical intervention .  The patient's history has been reviewed, patient examined, no change in status, stable for surgery.  I have reviewed the patient's chart and labs.  Questions were answered to the patient's satisfaction.     Katrina Peck [employer]

## 2015-02-07 NOTE — Care Management Important Message (Signed)
Important Message  Patient Details  Name: Katrina Peck MRN: [id number] Date of Birth: [birth year]   Medicare Important Message Given:  Yes    Flint Hakeem P Belen Pesch 02/07/2015, 2:32 PM

## 2015-02-08 ENCOUNTER — Encounter (HOSPITAL_COMMUNITY): Payer: Medicare Other

## 2015-02-08 DIAGNOSIS — I429 Cardiomyopathy, unspecified: Secondary | ICD-10-CM

## 2015-02-08 DIAGNOSIS — I447 Left bundle-branch block, unspecified: Secondary | ICD-10-CM

## 2015-02-08 LAB — CARBOXYHEMOGLOBIN
CARBOXYHEMOGLOBIN: 1.2 % (ref 0.5–1.5)
Methemoglobin: 0.7 % (ref 0.0–1.5)
O2 Saturation: 52.3 %
Total hemoglobin: 13.1 g/dL (ref 12.0–16.0)

## 2015-02-08 LAB — CBC
HCT: 41.6 % (ref 36.0–46.0)
Hemoglobin: 13.3 g/dL (ref 12.0–15.0)
MCH: 27.7 pg (ref 26.0–34.0)
MCHC: 32 g/dL (ref 30.0–36.0)
MCV: 86.7 fL (ref 78.0–100.0)
PLATELETS: 271 10*3/uL (ref 150–400)
RBC: 4.8 MIL/uL (ref 3.87–5.11)
RDW: 16.7 % — AB (ref 11.5–15.5)
WBC: 7.1 10*3/uL (ref 4.0–10.5)

## 2015-02-08 LAB — BASIC METABOLIC PANEL
ANION GAP: 9 (ref 5–15)
BUN: 22 mg/dL — ABNORMAL HIGH (ref 6–20)
CALCIUM: 8.9 mg/dL (ref 8.9–10.3)
CO2: 25 mmol/L (ref 22–32)
Chloride: 101 mmol/L (ref 101–111)
Creatinine, Ser: 0.97 mg/dL (ref 0.44–1.00)
GFR, EST NON AFRICAN AMERICAN: 59 mL/min — AB (ref >60)
GLUCOSE: 95 mg/dL (ref 65–99)
Potassium: 4.5 mmol/L (ref 3.5–5.1)
Sodium: 135 mmol/L (ref 135–145)

## 2015-02-08 LAB — DIGOXIN LEVEL

## 2015-02-08 MED ORDER — SODIUM CHLORIDE 0.9 % IJ SOLN: 10.0000 mL | INTRAMUSCULAR | Status: DC | PRN

## 2015-02-08 MED ORDER — TORSEMIDE 20 MG PO TABS
20.0000 mg | ORAL_TABLET | Freq: Every day | ORAL | Status: DC
  Administered 2015-02-09: 20 mg via ORAL
  Filled 2015-02-08: qty 1

## 2015-02-08 MED ORDER — SODIUM CHLORIDE 0.9 % IJ SOLN
10.0000 mL | Freq: Two times a day (BID) | INTRAMUSCULAR | Status: DC
  Administered 2015-02-08 – 2015-02-09 (×3): 10 mL

## 2015-02-08 NOTE — Progress Notes (Signed)
Peripherally Inserted Central Catheter/Midline Placement  The IV Nurse has discussed with the patient and/or persons authorized to consent for the patient, the purpose of this procedure and the potential benefits and risks involved with this procedure.  The benefits include less needle sticks, lab draws from the catheter and patient may be discharged home with the catheter.  Risks include, but not limited to, infection, bleeding, blood clot (thrombus formation), and puncture of an artery; nerve damage and irregular heat beat.  Alternatives to this procedure were also discussed.  PICC/Midline Placement Documentation  PICC / Midline Double Lumen 02/08/15 PICC Right Brachial 37 cm 2 cm (Active)  Indication for Insertion or Continuance of Line Vasoactive infusions 02/08/2015  9:00 AM  Exposed Catheter (cm) 2 cm 02/08/2015  9:00 AM  Dressing Change Due 02/15/15 02/08/2015  9:00 AM       Stacie Glaze Horton 02/08/2015, 9:10 AM

## 2015-02-08 NOTE — Progress Notes (Signed)
Patient ID: Katrina Peck, female   DOB: Jun 21, 1945, 69 y.o.   MRN: [id number]   Cardiology Progress Note   Subjective:   Yesterday she was started on milrinone 0.125 mcg after RHC. PICC to be placed today.   Mild dyspnea with exertion.   RHC  RA mean 3 RV 38/6 PA 43/18, mean 29 PCWP mean 19 Oxygen saturations: PA 68% AO 100% Cardiac Output (Thermo) 2.33 Cardiac Index (Thermo) 1.7 PVR 4.3 WU  Objective:  Temp:  [97.3 F (36.3 C)-98.1 F (36.7 C)] 97.6 F (36.4 C) (11/15 0742) Pulse Rate:  [0-109] 72 (11/14 2100) Resp:  [0-55] 18 (11/15 0742) BP: (91-119)/(63-88) 100/68 mmHg (11/15 0742) SpO2:  [0 %-100 %] 97 % (11/15 0742) Weight:  [90 lb 9.7 oz (41.1 kg)-91 lb 12.8 oz (41.64 kg)] 91 lb 12.8 oz (41.64 kg) (11/15 0320) Weight change: -2 lb 6.3 oz (-1.085 kg)  Intake/Output from previous day: 11/14 0701 - 11/15 0700 In: 505.4 [P.O.:480; I.V.:25.4] Out: 200 [Urine:200]     . amiodarone  100 mg Oral Daily  . apixaban  5 mg Oral BID  . aspirin EC  81 mg Oral Daily  . digoxin  0.0625 mg Oral Daily  . potassium chloride  40 mEq Oral Daily  . sodium chloride  3 mL Intravenous Q12H  . spironolactone  12.5 mg Oral Daily  . torsemide  20 mg Oral BID    Physical Exam: Sitting on the side  General:  Elderly frail  No resp difficulty HEENT: normal Neck: supple. JVP 6-7 cm.  No  thryomegaly appreciated. Cor:  Regular rate & rhythm. 2/6 MR/TR   Lungs: clear Abdomen: soft, nontender, no HSM Extremities: no cyanosis, clubbing, rash, no edema.  Neuro: alert & orientedx3, cranial nerves grossly intact. moves all 4 extremities w/o difficulty. Affect pleasant  Lab Results: Results for orders placed or performed during the hospital encounter of 02/03/15 (from the past 48 hour(s))  Basic metabolic panel     Status: Abnormal   Collection Time: 02/07/15  4:36 AM  Result Value Ref Range   Sodium 137 135 - 145 mmol/L   Potassium 4.1 3.5 - 5.1 mmol/L    Comment: DELTA CHECK  NOTED   Chloride 104 101 - 111 mmol/L   CO2 26 22 - 32 mmol/L   Glucose, Bld 88 65 - 99 mg/dL   BUN 24 (H) 6 - 20 mg/dL   Creatinine, Ser 0.88 0.44 - 1.00 mg/dL   Calcium 8.6 (L) 8.9 - 10.3 mg/dL   GFR calc non Af Amer >60 >60 mL/min   GFR calc Af Amer >60 >60 mL/min    Comment: (NOTE) The eGFR has been calculated using the CKD EPI equation. This calculation has not been validated in all clinical situations. eGFR's persistently <60 mL/min signify possible Chronic Kidney Disease.    Anion gap 7 5 - 15  I-STAT 3, venous blood gas (G3P V)     Status: Abnormal   Collection Time: 02/07/15  1:19 PM  Result Value Ref Range   pH, Ven 7.417 (H) 7.250 - 7.300   pCO2, Ven 43.7 (L) 45.0 - 50.0 mmHg   pO2, Ven 35.0 30.0 - 45.0 mmHg   Bicarbonate 28.2 (H) 20.0 - 24.0 mEq/L   TCO2 30 0 - 100 mmol/L   O2 Saturation 68.0 %   Acid-Base Excess 3.0 (H) 0.0 - 2.0 mmol/L   Patient temperature HIDE    Sample type VENOUS    Comment NOTIFIED PHYSICIAN  I-STAT 3, venous blood gas (G3P V)     Status: Abnormal   Collection Time: 02/07/15  1:19 PM  Result Value Ref Range   pH, Ven 7.414 (H) 7.250 - 7.300   pCO2, Ven 42.2 (L) 45.0 - 50.0 mmHg   pO2, Ven 35.0 30.0 - 45.0 mmHg   Bicarbonate 27.0 (H) 20.0 - 24.0 mEq/L   TCO2 28 0 - 100 mmol/L   O2 Saturation 68.0 %   Acid-Base Excess 2.0 0.0 - 2.0 mmol/L   Patient temperature HIDE    Sample type VENOUS    Comment NOTIFIED PHYSICIAN   I-STAT 3, venous blood gas (G3P V)     Status: Abnormal   Collection Time: 02/07/15  1:26 PM  Result Value Ref Range   pH, Ven 7.432 (H) 7.250 - 7.300   pCO2, Ven 42.4 (L) 45.0 - 50.0 mmHg   pO2, Ven 35.0 30.0 - 45.0 mmHg   Bicarbonate 28.2 (H) 20.0 - 24.0 mEq/L   TCO2 29 0 - 100 mmol/L   O2 Saturation 69.0 %   Acid-Base Excess 3.0 (H) 0.0 - 2.0 mmol/L   Patient temperature HIDE    Sample type VENOUS    Comment NOTIFIED PHYSICIAN   I-STAT 3, venous blood gas (G3P V)     Status: Abnormal   Collection Time:  02/07/15  1:26 PM  Result Value Ref Range   pH, Ven 7.435 (H) 7.250 - 7.300   pCO2, Ven 43.1 (L) 45.0 - 50.0 mmHg   pO2, Ven 36.0 30.0 - 45.0 mmHg   Bicarbonate 29.0 (H) 20.0 - 24.0 mEq/L   TCO2 30 0 - 100 mmol/L   O2 Saturation 70.0 %   Acid-Base Excess 4.0 (H) 0.0 - 2.0 mmol/L   Patient temperature HIDE    Sample type VENOUS    Comment NOTIFIED PHYSICIAN   MRSA PCR Screening     Status: None   Collection Time: 02/07/15  2:00 PM  Result Value Ref Range   MRSA by PCR NEGATIVE NEGATIVE    Comment:        The GeneXpert MRSA Assay (FDA approved for NASAL specimens only), is one component of a comprehensive MRSA colonization surveillance program. It is not intended to diagnose MRSA infection nor to guide or monitor treatment for MRSA infections.   Digoxin level     Status: Abnormal   Collection Time: 02/08/15  5:05 AM  Result Value Ref Range   Digoxin Level <0.2 (L) 0.8 - 2.0 ng/mL    Comment: RESULTS CONFIRMED BY MANUAL DILUTION  CBC     Status: Abnormal   Collection Time: 02/08/15  5:05 AM  Result Value Ref Range   WBC 7.1 4.0 - 10.5 K/uL   RBC 4.80 3.87 - 5.11 MIL/uL   Hemoglobin 13.3 12.0 - 15.0 g/dL   HCT 41.6 36.0 - 46.0 %   MCV 86.7 78.0 - 100.0 fL   MCH 27.7 26.0 - 34.0 pg   MCHC 32.0 30.0 - 36.0 g/dL   RDW 16.7 (H) 11.5 - 15.5 %   Platelets 271 150 - 400 K/uL  Basic metabolic panel     Status: Abnormal   Collection Time: 02/08/15  5:05 AM  Result Value Ref Range   Sodium 135 135 - 145 mmol/L   Potassium 4.5 3.5 - 5.1 mmol/L   Chloride 101 101 - 111 mmol/L   CO2 25 22 - 32 mmol/L   Glucose, Bld 95 65 - 99 mg/dL   BUN 22 (H)  6 - 20 mg/dL   Creatinine, Ser 0.97 0.44 - 1.00 mg/dL   Calcium 8.9 8.9 - 10.3 mg/dL   GFR calc non Af Amer 59 (L) >60 mL/min   GFR calc Af Amer >60 >60 mL/min    Comment: (NOTE) The eGFR has been calculated using the CKD EPI equation. This calculation has not been validated in all clinical situations. eGFR's persistently <60 mL/min  signify possible Chronic Kidney Disease.    Anion gap 9 5 - 15    Imaging: Dg Chest 2 View  02/03/2015  CLINICAL DATA:  Shortness of breath for 1 month EXAM: CHEST  2 VIEW COMPARISON:  November 11, 2014 FINDINGS: The mediastinal contour is normal. There is marked cardiomegaly. There is a small left pleural effusion and small mole to moderate right pleural effusion. There is no pulmonary edema or focal pneumonia. The visualized skeletal structures are stable. IMPRESSION: Small left pleural effusion, small to moderate right pleural effusion. No pulmonary edema. Marked cardiomegaly. Electronically Signed   By: Abelardo Diesel M.D.   On: 02/03/2015 21:20     Tele- NSR, PVCs  Assessment/Plan:   1. Acute on chronic systolic HF: NICM with severe biventricular failure.  Noncompliant with meds at home.  EF 10-15% by echo this admit, PASP 58, severely dilated RV with severely reduced systolic function.  - RHC: Thermo CO/CI 2.3/1.7 Started on milrinone 0.125 mcg. PICC will be placed later today.  - Check CO-OX once PICC placed.  - Continue digoxin,  Dig level <0.2.  - Continue po torsemide 20 bid.  - Suspect poor candidate for advanced therapies => has had poor compliance historically.  Also has severe RV dysfunction which is concerning for LVAD. - Wide LBBB, has not been interested in CRT in past, may be too little too late at this point.   2. Recurrent pericardial effusion s/p window 07/26/14:  mod effusion by echo this admit 3. PAF: In NSR on amio, Eliquis.  Restart eliquis today.   4. Elevated troponin: Normal cors on cath 4/16, suspect demand ischemia with volume overload.  5. Noncompliance 6. Hypokalemia: Resolved K 4.5 .   Consult care management for home milrinone.  Check CO-OX once PICC placed.   Amy Clegg,NP-C  02/08/2015  7:48 AM   Patient seen with NP, agree with the above note.  PICC in, CVP 1.  Will decrease torsemide to once a day.  Will send co-ox, adjust milrinone as needed.   Needs to ambulate, possibly home tomorrow   Not many good options beyond home milrinone.  She has had poor compliance historically.  LVAD would be difficult with compliance issues and dilated/dysfunctional RV.  Will discuss CRT with her as it could potentially help.  She is willing to talk to EP, not sure that she would be willing to get one.    Loralie Champagne 02/08/2015 10:43 AM

## 2015-02-08 NOTE — Clinical Documentation Improvement (Signed)
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 A specific answer is not anticipated or expected.   Thank You,   Lavonda Jumbo RN, BSN, CCDS Health Information Management [place] [phone]

## 2015-02-08 NOTE — Care Management Note (Signed)
Case Management Note  Patient Details  Name: Katrina Peck MRN: [id number] Date of Birth: Jun 26, 1945  Subjective/Objective:    Adm w heart failure               Action/Plan: lives w fam, will need home iv milrinone   Expected Discharge Date:                  Expected Discharge Plan:  Home w Home Health Services  In-House Referral:     Discharge planning Services  CM Consult  Post Acute Care Choice:  Durable Medical Equipment Choice offered to:     DME Arranged:  IV pump/equipment DME Agency:  [facility]  HH Arranged:  RN, Disease Management HH Agency:  [facility]  Status of Service:     Medicare Important Message Given:  Yes Date Medicare IM Given:    Medicare IM give by:    Date Additional Medicare IM Given:    Additional Medicare Important Message give by:     If discussed at Long Length of Stay Meetings, dates discussed:    Additional Comments: spoke w pt and went over list of hhc agencies. No pref. Hx of [facility]. Ref to pam and donna w [facility] for home milrinone.  Hanley Hays, RN 02/08/2015, 11:23 AM

## 2015-02-08 NOTE — Progress Notes (Signed)
Complained of nerve pain when accessing right upper arm brachial vein, needle withdrew approximately 1/2 cm and redirected. Completed PICC insertion without any other difficulty. Continues to state that right hand feels a little numb. Sitting on bedside eating breakfast with right hand.Encouraged to use and move right arm and hand. Will continue to monitor.

## 2015-02-08 NOTE — Progress Notes (Signed)
ANTICOAGULATION CONSULT NOTE - Initial Consult  Pharmacy Consult for apixaban Indication: atrial fibrillation  No Known Allergies  Patient Measurements: Height: 5\' 2"  (157.5 cm) Weight: 91 lb 12.8 oz (41.64 kg) IBW/kg (Calculated) : 50.1   Vital Signs: Temp: 97.6 F (36.4 C) (11/15 0742) Temp Source: Oral (11/15 0742) BP: 100/68 mmHg (11/15 0742)  Labs:  Recent Labs  02/06/15 0326 02/07/15 0436 02/08/15 0505  HGB  --   --  13.3  HCT  --   --  41.6  PLT  --   --  271  CREATININE 0.88 0.88 0.97    Estimated Creatinine Clearance: 36.5 mL/min (by C-G formula based on Cr of 0.97).   Medical History: Past Medical History  Diagnosis Date  . Anemia   . Hypertension   . Blood transfusion without reported diagnosis   . Chronic systolic CHF (congestive heart failure) (HCC)     a. 06/2014 Echo: EF 20%.  Marland Kitchen NICM (nonischemic cardiomyopathy) (HCC)     a. 06/2014 Echo: EF 20%, diff HK, mild MR, mildly dil LA/RA, mildly reduced RV fxn;  b. 06/2014 Cath: nl cors.  . Pericardial effusion     a. 06/2014 Large effusion, no tamponade phys.    Medications:  Scheduled:  . amiodarone  100 mg Oral Daily  . apixaban  5 mg Oral BID  . aspirin EC  81 mg Oral Daily  . digoxin  0.0625 mg Oral Daily  . potassium chloride  40 mEq Oral Daily  . sodium chloride  10-40 mL Intracatheter Q12H  . sodium chloride  3 mL Intravenous Q12H  . spironolactone  12.5 mg Oral Daily  . torsemide  20 mg Oral BID    Assessment: 69 yo woman admitted 02/03/2015 for progressive DOE on apixaban PTA for AFib with CHADS2VASc of at least 4. Pharmacy consulted to restart apixaban after RHC.  PMH HTN, NICM (EF 11% per cMRI on 10/2014), mod MR & RV dysfunction, AFib, recurrent pericardial effusion s/p window  CBC wnl, no noted bleeding. Given age < 7 and Cr 1, will restart home dose of 5 BID.  Goal of Therapy:  Monitor platelets by anticoagulation protocol: Yes   Plan:  Start apixaban 5 mg BID this  morning Monitor CBC, s/sx bleeding   [name], Pharm.D. PGY2 Cardiology Pharmacy Resident Pager: [phone] 02/08/2015,10:05 AM

## 2015-02-08 NOTE — Consult Note (Signed)
ELECTROPHYSIOLOGY CONSULT NOTE    Patient ID: Katrina Peck MRN: [id number], DOB/AGE: [birth year] 69 y.o.  Admit date: 02/03/2015 Date of Consult: 02/08/2015  Primary Physician: Maryelizabeth Rowan, MD Primary Cardiologist: Shirlee Latch  Reason for Consultation: evaluate for CRT  HPI:  Katrina Peck is a 69 y.o. female with a past medical history significant for hypertension, NICM, recurrent pericardial effusion s/p window, moderate MR, moderate RV dysfunction, paroxysmal atrial fibrillation.  She was admitted 02-03-15 with worsening shortness of breath on exertion.  RHC was done and she was started on Milrinone.  She is not considered to be a good candidate for advanced HF therapies secondary to non-compliance and poor RV function.  EP has been asked to evaluate for placement of CRT device.   She currently feels well and recently walked in the hallway.  Her shortness of breath is improved from admission. She denies chest pain, recent fevers, chills, nausea or vomiting.   Echo 02-04-15 demonstrated EF 10-15%, moderate MR, severe TR, PA pressure 58, moderate free flowing pericardial effusion, LA 41.   Past Medical History  Diagnosis Date  . Anemia   . Hypertension   . Blood transfusion without reported diagnosis   . Chronic systolic CHF (congestive heart failure) (HCC)     a. 06/2014 Echo: EF 20%.  Marland Kitchen NICM (nonischemic cardiomyopathy) (HCC)     a. 06/2014 Echo: EF 20%, diff HK, mild MR, mildly dil LA/RA, mildly reduced RV fxn;  b. 06/2014 Cath: nl cors.  . Pericardial effusion     a. 06/2014 Large effusion, no tamponade phys.     Surgical History:  Past Surgical History  Procedure Laterality Date  . No past surgeries    . Left and right heart catheterization with coronary angiogram N/A 07/23/2014    Procedure: LEFT AND RIGHT HEART CATHETERIZATION WITH CORONARY ANGIOGRAM;  Surgeon: Laurey Morale, MD;  Location: [facility] CATH LAB;  Service: Cardiovascular;  Laterality: N/A;  . Subxyphoid  pericardial window N/A 07/26/2014    Procedure: SUBXYPHOID PERICARDIAL WINDOW;  Surgeon: Delight Ovens, MD;  Location: [facility] OR;  Service: Thoracic;  Laterality: N/A;  . Tee without cardioversion N/A 07/26/2014    Procedure: TRANSESOPHAGEAL ECHOCARDIOGRAM (TEE);  Surgeon: Delight Ovens, MD;  Location: [facility] OR;  Service: Thoracic;  Laterality: N/A;  . Pleural effusion drainage N/A 07/26/2014    Procedure: DRAINAGE OF PERICARDIAL EFFUSION;  Surgeon: Delight Ovens, MD;  Location: [facility] OR;  Service: Thoracic;  Laterality: N/A;  . Cardiac catheterization N/A 02/07/2015    Procedure: Right Heart Cath;  Surgeon: Laurey Morale, MD;  Location: [facility] INVASIVE CV LAB;  Service: Cardiovascular;  Laterality: N/A;     Prescriptions prior to admission  Medication Sig Dispense Refill Last Dose  . amiodarone (PACERONE) 100 MG tablet Take 1 tablet (100 mg total) by mouth daily. 30 tablet 5 Past Week at Unknown time  . apixaban (ELIQUIS) 5 MG TABS tablet Take 1 tablet (5 mg total) by mouth 2 (two) times daily. (Patient taking differently: Take 5 mg by mouth daily. ) 60 tablet 5 Past Week at Unknown time  . carvedilol (COREG) 3.125 MG tablet Take 3.125 mg by mouth daily.   Past Week at Unknown time  . lisinopril (PRINIVIL,ZESTRIL) 2.5 MG tablet Take 2.5 mg by mouth daily.   Past Week at Unknown time  . torsemide (DEMADEX) 20 MG tablet Take 1 tablet (20 mg total) by mouth daily. (Patient taking differently: Take 20 mg by mouth every other day. )  60 tablet 6 02/02/2015 at Unknown time    Inpatient Medications:  . amiodarone  100 mg Oral Daily  . apixaban  5 mg Oral BID  . aspirin EC  81 mg Oral Daily  . digoxin  0.0625 mg Oral Daily  . potassium chloride  40 mEq Oral Daily  . sodium chloride  10-40 mL Intracatheter Q12H  . sodium chloride  3 mL Intravenous Q12H  . spironolactone  12.5 mg Oral Daily  . [START ON 02/09/2015] torsemide  20 mg Oral Daily    Allergies: No Known Allergies  Social History    Social History  . Marital Status: Married    Spouse Name: N/A  . Number of Children: N/A  . Years of Education: N/A   Occupational History  . Not on file.   Social History Main Topics  . Smoking status: Never Smoker   . Smokeless tobacco: Never Used  . Alcohol Use: No  . Drug Use: No  . Sexual Activity: No   Other Topics Concern  . Not on file   Social History Narrative   Lives in [place] with husband.  Not currently working.     Family History  Problem Relation Age of Onset  . Hypertension Sister   . Diabetes Maternal Grandmother   . Cancer Mother     died young (pt was only in [employer] @ the time)  . Other Father     died in his 48's.     Review of Systems: All other systems reviewed and are otherwise negative except as noted above.  Physical Exam: Filed Vitals:   02/08/15 0005 02/08/15 0320 02/08/15 0742 02/08/15 1126  BP: 100/75 99/63 100/68 86/64  Pulse:      Temp: 97.3 F (36.3 C) 97.7 F (36.5 C) 97.6 F (36.4 C) 97.8 F (36.6 C)  TempSrc: Oral Oral Oral Oral  Resp: 20 16 18 18   Height:      Weight:  91 lb 12.8 oz (41.64 kg)    SpO2: 96% 100% 97% 98%    GEN- The patient is elderly and chronically ill appearing, alert and oriented x 3 today.   HEENT: normocephalic, atraumatic; sclera clear, conjunctiva pink; hearing intact; oropharynx clear; neck supple  Lymph- no cervical lymphadenopathy Lungs- Clear to ausculation bilaterally, normal work of breathing.  No wheezes, rales, rhonchi Heart- Regular rate and rhythm, 2/6 SEM GI- soft, non-tender, non-distended, bowel sounds present, no hepatosplenomegaly Extremities- no clubbing, cyanosis, or edema; DP/PT/radial pulses 2+ bilaterally, PICC RUE Katrina- no significant deformity or atrophy Skin- warm and dry, no rash or lesion Psych- euthymic mood, full affect Neuro- strength and sensation are intact  Labs:   Lab Results  Component Value Date   WBC 7.1 02/08/2015   HGB 13.3 02/08/2015   HCT 41.6  02/08/2015   MCV 86.7 02/08/2015   PLT 271 02/08/2015    Recent Labs Lab 02/05/15 0414  02/08/15 0505  NA 138  < > 135  K 3.9  < > 4.5  CL 101  < > 101  CO2 27  < > 25  BUN 28*  < > 22*  CREATININE 1.07*  < > 0.97  CALCIUM 8.8*  < > 8.9  PROT 6.6  --   --   BILITOT 1.1  --   --   ALKPHOS 138*  --   --   ALT 37  --   --   AST 45*  --   --   GLUCOSE 109*  < >  95  < > = values in this interval not displayed.    Radiology/Studies: Dg Chest 2 View 02/03/2015  CLINICAL DATA:  Shortness of breath for 1 month EXAM: CHEST  2 VIEW COMPARISON:  November 11, 2014 FINDINGS: The mediastinal contour is normal. There is marked cardiomegaly. There is a small left pleural effusion and small mole to moderate right pleural effusion. There is no pulmonary edema or focal pneumonia. The visualized skeletal structures are stable. IMPRESSION: Small left pleural effusion, small to moderate right pleural effusion. No pulmonary edema. Marked cardiomegaly. Electronically Signed   By: Sherian Rein M.D.   On: 02/03/2015 21:20    EKG: sinus rhythm, LBBB, QRS  TELEMETRY: sinus rhythm  Assessment/Plan: 1.  NICM/systolic heart failure The patient has a NICM and class IV heart failure with inotropes started this admission. She is not felt to be a candidate for advanced therapies with poor RV function and history of non-compliance.   Her QRS is <1108msec and so benefit of CRT is less clear. With class IV heart failure, she would not be a candidate for ICD.    2.  Paroxysmal atrial fibrillation Maintaining SR on amiodarone Continue Eliquis for CHADS2VASC of at least 4  Signed, Gypsy Balsam, NP 02/08/2015 2:26 PM  I have seen, examined the patient, and reviewed the above assessment and plan.  On exam, frail and fragile patient.  Changes to above are made where necessary.  She has advanced nonischemic CM.  She is not a candidate for ICD implantation due to NYHA Class IV CHF.  Recent data regarding benefit  from ICD in nonischemic CM patients also questions benefits of this therapy in patients such as Katrina Peck who have a very high nonarrhythmic mortality.  Her QRS <150 suggests that benefit from CRT is probably marginal at best.   I did discuss CRT-P as a possible option with the patient.  At this time, we agree that ongoing medical therapy is probably best.  Ultimately, a more palliative approach may be considered.  Electrophysiology team to see as needed while here. Please call with questions.   Co Sign: Hillis Range, MD 02/08/2015 3:36 PM

## 2015-02-09 LAB — CBC
HCT: 42 % (ref 36.0–46.0)
HEMOGLOBIN: 13.2 g/dL (ref 12.0–15.0)
MCH: 27.4 pg (ref 26.0–34.0)
MCHC: 31.4 g/dL (ref 30.0–36.0)
MCV: 87.3 fL (ref 78.0–100.0)
Platelets: 300 10*3/uL (ref 150–400)
RBC: 4.81 MIL/uL (ref 3.87–5.11)
RDW: 16.7 % — ABNORMAL HIGH (ref 11.5–15.5)
WBC: 7.1 10*3/uL (ref 4.0–10.5)

## 2015-02-09 LAB — CARBOXYHEMOGLOBIN
CARBOXYHEMOGLOBIN: 1 % (ref 0.5–1.5)
Carboxyhemoglobin: 1.1 % (ref 0.5–1.5)
METHEMOGLOBIN: 0.9 % (ref 0.0–1.5)
Methemoglobin: 0.6 % (ref 0.0–1.5)
O2 SAT: 55.1 %
O2 Saturation: 43.6 %
Total hemoglobin: 17.3 g/dL — ABNORMAL HIGH (ref 12.0–16.0)
Total hemoglobin: 13 g/dL (ref 12.0–16.0)

## 2015-02-09 LAB — BASIC METABOLIC PANEL
ANION GAP: 8 (ref 5–15)
BUN: 25 mg/dL — ABNORMAL HIGH (ref 6–20)
CALCIUM: 9.1 mg/dL (ref 8.9–10.3)
CHLORIDE: 102 mmol/L (ref 101–111)
CO2: 26 mmol/L (ref 22–32)
Creatinine, Ser: 1.11 mg/dL — ABNORMAL HIGH (ref 0.44–1.00)
GFR calc non Af Amer: 50 mL/min — ABNORMAL LOW (ref >60)
GFR, EST AFRICAN AMERICAN: 58 mL/min — AB (ref >60)
Glucose, Bld: 99 mg/dL (ref 65–99)
POTASSIUM: 4.5 mmol/L (ref 3.5–5.1)
Sodium: 136 mmol/L (ref 135–145)

## 2015-02-09 MED ORDER — SPIRONOLACTONE 25 MG PO TABS: 12.5000 mg | ORAL_TABLET | Freq: Every day | ORAL | Status: DC

## 2015-02-09 MED ORDER — MILRINONE IN DEXTROSE 20 MG/100ML IV SOLN: 0.2500 ug/kg/min | INTRAVENOUS | Status: DC

## 2015-02-09 MED ORDER — POTASSIUM CHLORIDE CRYS ER 20 MEQ PO TBCR: 40.0000 meq | EXTENDED_RELEASE_TABLET | Freq: Every day | ORAL | Status: DC

## 2015-02-09 MED ORDER — DIGOXIN 62.5 MCG PO TABS
0.0625 mg | ORAL_TABLET | Freq: Every day | ORAL | Status: DC
Start: 2015-02-09 — End: 2015-09-06

## 2015-02-09 NOTE — Care Management Note (Signed)
Case Management Note  Patient Details  Name: Katrina Peck MRN: [id number] Date of Birth: 07/04/1945  Subjective/Objective:    Adm w heart failure                Action/Plan:lives w husband   Expected Discharge Date:                  Expected Discharge Plan:  Home w Home Health Services  In-House Referral:     Discharge planning Services  CM Consult  Post Acute Care Choice:  Durable Medical Equipment, Home Health Choice offered to:  Patient  DME Arranged:  IV pump/equipment DME Agency:  [facility]  HH Arranged:  RN, Disease Management HH Agency:  [facility]  Status of Service:     Medicare Important Message Given:  Yes Date Medicare IM Given:    Medicare IM give by:    Date Additional Medicare IM Given:    Additional Medicare Important Message give by:     If discussed at Long Length of Stay Meetings, dates discussed:    Additional Comments: alerted pam w [facility] of dc for today. Med will be here aroung 4:30p and [facility] will hook up.  Hanley Hays, RN 02/09/2015, 2:49 PM

## 2015-02-09 NOTE — Discharge Summary (Signed)
Advanced Heart Failure Team  Discharge Summary   Patient ID: Katrina Peck MRN: [id number], DOB/AGE: Nov 14, 1945 69 y.o. Admit date: 02/03/2015 D/C date:     02/09/2015   Primary Discharge Diagnoses:  . Acute on chronic systolic HF: NICM with severe biventricular failure. 2. 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 PAF: In NSR on amio, Eliquis.  4. Elevated troponin: Normal cors on cath 4/16, suspect demand ischemia with volume overload.  5. Noncompliance 6. Hypokalemia:  Hospital Course:  Mrs Orr is 71F with HTN, NICM (LVEF 11% by CMR 8/16), recurrent pericardial effusion s/p pericardial window (transudate), moderate MR, moderate RV dysfunction, restrictive filling pattern, AF, and chronic non-adherence admitted with worsening of DOE and volume overload.    1. Acute on chronic systolic HF: NICM with severe biventricular failure. Noncompliant with meds at home. EF 10-15% by echo this admit, PASP 58, severely dilated RV with severely reduced systolic function. Seen by EP: QRS < 150 so unlikely to be good candidate for CRT.  Diuresed with IV lasix. Once euvolemic taken to the cath lab on 11/14 with low output physiology noted. Started on milrinone.  Co-ox remained low on milrinone 0.125 so milrinone increased to 0.25 mcg/kg/min with improved  CO-OX 55%. PICC placed in RUE for home milrinone.  - Continue digoxin, level ok.  - Overall she diuresed 10 pounds. Continue torsemide 20 mg daily and spironolactone.  - Not many good options beyond home milrinone. She has had poor compliance historically. LVAD not an option with compliance issues and dilated/dysfunctional RV.  [facility] to provide home milrinone and follow for weekly lab work.  She will continue to be followed closely in the HF clinic.  2. 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 PAF: In NSR on amio, Eliquis.  4. Elevated  troponin: Normal cors on cath 4/16, suspect demand ischemia with volume overload.  5. Noncompliance 6. Hypokalemia: K supplemented. Awaiting today's BMET.   Right Heart Cath 11/03/29/2014  [facility] Procedural Findings: Hemodynamics (mmHg) Peck mean 3 RV 38/6 PA 43/18, mean 29 PCWP mean 19 Oxygen saturations: PA 68% AO 100% Cardiac Output (Fick) 3.25  Cardiac Index (Fick) 2.2 Cardiac Output (Thermo) 2.33 Cardiac Index (Thermo) 1.7 PVR 4.3 WU    Discharge Weight Range: 92 pounds.  Discharge Vitals: Blood pressure 97/69, pulse 79, temperature 97.5 F (36.4 C), temperature source Oral, resp. rate 18, height  (1.575 m), weight 92 lb 13 oz (42.1 kg), SpO2 100 %.  Labs: Lab Results  Component Value Date   WBC 7.1 02/09/2015   HGB 13.2 02/09/2015   HCT 42.0 02/09/2015   MCV 87.3 02/09/2015   PLT 300 02/09/2015    Recent Labs Lab 02/05/15 0414  02/09/15 0720  NA 138  < > 136  K 3.9  < > 4.5  CL 101  < > 102  CO2 27  < > 26  BUN 28*  < > 25*  CREATININE 1.07*  < > 1.11*  CALCIUM 8.8*  < > 9.1  PROT 6.6  --   --   BILITOT 1.1  --   --   ALKPHOS 138*  --   --   ALT 37  --   --   AST 45*  --   --   GLUCOSE 109*  < > 99  < > = values in this interval not displayed. No results found for: CHOL, HDL, LDLCALC, TRIG BNP (last 3  results)  Recent Labs  10/27/14 1400 11/11/14 1345 02/03/15 2310  BNP 3613.4* 3454.9* 4003.4*    ProBNP (last 3 results) No results for input(s): PROBNP in the last 8760 hours.   Diagnostic Studies/Procedures   No results found.  Discharge Medications     Medication List    STOP taking these medications        carvedilol 3.125 MG tablet  Commonly known as:  COREG     lisinopril 2.5 MG tablet  Commonly known as:  PRINIVIL,ZESTRIL      TAKE these medications        amiodarone 100 MG tablet  Commonly known as:  PACERONE  Take 1 tablet (100 mg total) by mouth daily.     apixaban 5 MG Tabs tablet  Commonly known as:  ELIQUIS   Take 1 tablet (5 mg total) by mouth 2 (two) times daily.     Digoxin 62.5 MCG Tabs  Take 0.0625 mg by mouth daily.     milrinone 20 MG/100ML Soln infusion  Commonly known as:  PRIMACOR  Inject 10.55 mcg/min into the vein continuous. Per  [facility] pharmacy     potassium chloride SA 20 MEQ tablet  Commonly known as:  K-DUR,KLOR-CON  Take 2 tablets (40 mEq total) by mouth daily.     spironolactone 25 MG tablet  Commonly known as:  ALDACTONE  Take 0.5 tablets (12.5 mg total) by mouth daily.     torsemide 20 MG tablet  Commonly known as:  DEMADEX  Take 1 tablet (20 mg total) by mouth daily.        Disposition   The patient will be discharged in stable condition to home. Discharge Instructions    Diet - low sodium heart healthy    Complete by:  As directed      Heart Failure patients record your daily weight using the same scale at the same time of day    Complete by:  As directed      Increase activity slowly    Complete by:  As directed           Follow-up Information    Follow up with [facility], [place].   Specialty:  Family Medicine   Contact information:   [address] [place] [address] [phone]       Follow up with [name], MD On 02/15/2015.   Specialty:  Cardiology   Why:  at 11:40 Garage Code [id number]   Contact information:   [address] [place] [address] [phone]         Duration of Discharge Encounter: Greater than 35 minutes   Signed, Kyndell Zeiser NP-C  02/09/2015, 1:54 PM

## 2015-02-09 NOTE — Progress Notes (Signed)
Discharge teaching gone over with patient. Discussed diet, medications, and when to call doctor. Stressed importance of daily weights. No further questions at this time. Will discharge shortly.

## 2015-02-09 NOTE — Discharge Instructions (Signed)
Heart Failure  Heart failure means your heart has trouble pumping blood. This makes it hard for your body to work well. Heart failure is usually a long-term (chronic) condition. You must take good care of yourself and follow your doctor's treatment plan.  HOME CARE   Take your heart medicine as told by your doctor.    Do not stop taking medicine unless your doctor tells you to.    Do not skip any dose of medicine.    Refill your medicines before they run out.    Take other medicines only as told by your doctor or pharmacist.   Stay active if told by your doctor. The elderly and people with severe heart failure should talk with a doctor about physical activity.   Eat heart-healthy foods. Choose foods that are without trans fat and are low in saturated fat, cholesterol, and salt (sodium). This includes fresh or frozen fruits and vegetables, fish, lean meats, fat-free or low-fat dairy foods, whole grains, and high-fiber foods. Lentils and dried peas and beans (legumes) are also good choices.   Limit salt if told by your doctor.   Cook in a healthy way. Roast, grill, broil, bake, poach, steam, or stir-fry foods.   Limit fluids as told by your doctor.   Weigh yourself every morning. Do this after you pee (urinate) and before you eat breakfast. Write down your weight to give to your doctor.   Take your blood pressure and write it down if your doctor tells you to.   Ask your doctor how to check your pulse. Check your pulse as told.   Lose weight if told by your doctor.   Stop smoking or chewing tobacco. Do not use gum or patches that help you quit without your doctor's approval.   Schedule and go to doctor visits as told.   Nonpregnant women should have no more than 1 drink a day. Men should have no more than 2 drinks a day. Talk to your doctor about drinking alcohol.   Stop illegal drug use.   Stay current with shots (immunizations).   Manage your health conditions as told by your doctor.   Learn to  manage your stress.   Rest when you are tired.   If it is really hot outside:    Avoid intense activities.    Use air conditioning or fans, or get in a cooler place.    Avoid caffeine and alcohol.    Wear loose-fitting, lightweight, and light-colored clothing.   If it is really cold outside:    Avoid intense activities.    Layer your clothing.    Wear mittens or gloves, a hat, and a scarf when going outside.    Avoid alcohol.   Learn about heart failure and get support as needed.   Get help to maintain or improve your quality of life and your ability to care for yourself as needed.  GET HELP IF:    You gain weight quickly.   You are more short of breath than usual.   You cannot do your normal activities.   You tire easily.   You cough more than normal, especially with activity.   You have any or more puffiness (swelling) in areas such as your hands, feet, ankles, or belly (abdomen).   You cannot sleep because it is hard to breathe.   You feel like your heart is beating fast (palpitations).   You get dizzy or light-headed when you stand up.  GET HELP   RIGHT AWAY IF:    You have trouble breathing.   There is a change in mental status, such as becoming less alert or not being able to focus.   You have chest pain or discomfort.   You faint.  MAKE SURE YOU:    Understand these instructions.   Will watch your condition.   Will get help right away if you are not doing well or get worse.     This information is not intended to replace advice given to you by your health care provider. Make sure you discuss any questions you have with your health care provider.     Document Released: 12/20/2007 Document Revised: 04/02/2014 Document Reviewed: 04/28/2012  Elsevier Interactive Patient Education 2016 [employer]

## 2015-02-09 NOTE — Progress Notes (Signed)
[facility]  Patient Status: New pt for [facility] this admission  [facility] is providing the following services: HHRN and Home Inotrope Pharmacy team for home Milrinone.  [facility] hospital infusion coordinator will provide in hospital education re: [facility] home Milrinone program and our "We've Got [facility] Program" for HF.  [facility] team will follow pt to support DC home as ordered.   If patient discharges after hours, please call [phone].   Sedalia Muta 02/09/2015, 9:18 AM

## 2015-02-09 NOTE — Progress Notes (Signed)
Patient ID: Katrina Peck, female   DOB: May 24, 1945, 69 y.o.   MRN: [id number]   Cardiology Progress Note   Subjective:   She is feeling good, did a lot of walking yesterday.  Co-ox 44% today on milrinone 0.125. CVP 4-7.    [facility]  RA mean 3 RV 38/6 PA 43/18, mean 29 PCWP mean 19 Oxygen saturations: PA 68% AO 100% Cardiac Output (Thermo) 2.33 Cardiac Index (Thermo) 1.7 PVR 4.3 WU  Objective:  Temp:  [97.5 F (36.4 C)-98.4 F (36.9 C)] 97.5 F (36.4 C) (11/16 0323) Resp:  [16-18] 18 (11/16 0323) BP: (86-114)/(64-74) 114/73 mmHg (11/16 0323) SpO2:  [97 %-100 %] 100 % (11/16 0323) Weight:  [92 lb 13 oz (42.1 kg)] 92 lb 13 oz (42.1 kg) (11/16 0323) Weight change: 2 lb 3.3 oz (1 kg)  Intake/Output from previous day: 11/15 0701 - 11/16 0700 In: 518.4 [P.O.:480; I.V.:38.4] Out: 2100 [Urine:2100]     . amiodarone  100 mg Oral Daily  . apixaban  5 mg Oral BID  . aspirin EC  81 mg Oral Daily  . digoxin  0.0625 mg Oral Daily  . potassium chloride  40 mEq Oral Daily  . sodium chloride  10-40 mL Intracatheter Q12H  . sodium chloride  3 mL Intravenous Q12H  . spironolactone  12.5 mg Oral Daily  . torsemide  20 mg Oral Daily    Physical Exam: Sitting on the side  General:  Elderly frail  No resp difficulty HEENT: normal Neck: supple. JVP 6-7 cm.  No  thryomegaly appreciated. Cor:  Regular rate & rhythm. 2/6 MR/TR   Lungs: clear Abdomen: soft, nontender, no HSM Extremities: no cyanosis, clubbing, rash, no edema.  Neuro: alert & orientedx3, cranial nerves grossly intact. moves all 4 extremities w/o difficulty. Affect pleasant  Lab Results: Results for orders placed or performed during the hospital encounter of 02/03/15 (from the past 48 hour(s))  I-STAT 3, venous blood gas (G3P V)     Status: Abnormal   Collection Time: 02/07/15  1:19 PM  Result Value Ref Range   pH, Ven 7.417 (H) 7.250 - 7.300   pCO2, Ven 43.7 (L) 45.0 - 50.0 mmHg   pO2, Ven 35.0 30.0 - 45.0 mmHg   Bicarbonate 28.2 (H) 20.0 - 24.0 mEq/L   TCO2 30 0 - 100 mmol/L   O2 Saturation 68.0 %   Acid-Base Excess 3.0 (H) 0.0 - 2.0 mmol/L   Patient temperature HIDE    Sample type VENOUS    Comment NOTIFIED PHYSICIAN   I-STAT 3, venous blood gas (G3P V)     Status: Abnormal   Collection Time: 02/07/15  1:19 PM  Result Value Ref Range   pH, Ven 7.414 (H) 7.250 - 7.300   pCO2, Ven 42.2 (L) 45.0 - 50.0 mmHg   pO2, Ven 35.0 30.0 - 45.0 mmHg   Bicarbonate 27.0 (H) 20.0 - 24.0 mEq/L   TCO2 28 0 - 100 mmol/L   O2 Saturation 68.0 %   Acid-Base Excess 2.0 0.0 - 2.0 mmol/L   Patient temperature HIDE    Sample type VENOUS    Comment NOTIFIED PHYSICIAN   I-STAT 3, venous blood gas (G3P V)     Status: Abnormal   Collection Time: 02/07/15  1:26 PM  Result Value Ref Range   pH, Ven 7.432 (H) 7.250 - 7.300   pCO2, Ven 42.4 (L) 45.0 - 50.0 mmHg   pO2, Ven 35.0 30.0 - 45.0 mmHg   Bicarbonate 28.2 (H)  20.0 - 24.0 mEq/L   TCO2 29 0 - 100 mmol/L   O2 Saturation 69.0 %   Acid-Base Excess 3.0 (H) 0.0 - 2.0 mmol/L   Patient temperature HIDE    Sample type VENOUS    Comment NOTIFIED PHYSICIAN   I-STAT 3, venous blood gas (G3P V)     Status: Abnormal   Collection Time: 02/07/15  1:26 PM  Result Value Ref Range   pH, Ven 7.435 (H) 7.250 - 7.300   pCO2, Ven 43.1 (L) 45.0 - 50.0 mmHg   pO2, Ven 36.0 30.0 - 45.0 mmHg   Bicarbonate 29.0 (H) 20.0 - 24.0 mEq/L   TCO2 30 0 - 100 mmol/L   O2 Saturation 70.0 %   Acid-Base Excess 4.0 (H) 0.0 - 2.0 mmol/L   Patient temperature HIDE    Sample type VENOUS    Comment NOTIFIED PHYSICIAN   MRSA PCR Screening     Status: None   Collection Time: 02/07/15  2:00 PM  Result Value Ref Range   MRSA by PCR NEGATIVE NEGATIVE    Comment:        The GeneXpert MRSA Assay (FDA approved for NASAL specimens only), is one component of a comprehensive MRSA colonization surveillance program. It is not intended to diagnose MRSA infection nor to guide or monitor treatment  for MRSA infections.   Digoxin level     Status: Abnormal   Collection Time: 02/08/15  5:05 AM  Result Value Ref Range   Digoxin Level <0.2 (L) 0.8 - 2.0 ng/mL    Comment: RESULTS CONFIRMED BY MANUAL DILUTION  CBC     Status: Abnormal   Collection Time: 02/08/15  5:05 AM  Result Value Ref Range   WBC 7.1 4.0 - 10.5 K/uL   RBC 4.80 3.87 - 5.11 MIL/uL   Hemoglobin 13.3 12.0 - 15.0 g/dL   HCT 41.6 36.0 - 46.0 %   MCV 86.7 78.0 - 100.0 fL   MCH 27.7 26.0 - 34.0 pg   MCHC 32.0 30.0 - 36.0 g/dL   RDW 16.7 (H) 11.5 - 15.5 %   Platelets 271 150 - 400 K/uL  Basic metabolic panel     Status: Abnormal   Collection Time: 02/08/15  5:05 AM  Result Value Ref Range   Sodium 135 135 - 145 mmol/L   Potassium 4.5 3.5 - 5.1 mmol/L   Chloride 101 101 - 111 mmol/L   CO2 25 22 - 32 mmol/L   Glucose, Bld 95 65 - 99 mg/dL   BUN 22 (H) 6 - 20 mg/dL   Creatinine, Ser 0.97 0.44 - 1.00 mg/dL   Calcium 8.9 8.9 - 10.3 mg/dL   GFR calc non Af Amer 59 (L) >60 mL/min   GFR calc Af Amer >60 >60 mL/min    Comment: (NOTE) The eGFR has been calculated using the CKD EPI equation. This calculation has not been validated in all clinical situations. eGFR's persistently <60 mL/min signify possible Chronic Kidney Disease.    Anion gap 9 5 - 15  Carboxyhemoglobin     Status: None   Collection Time: 02/08/15 10:25 AM  Result Value Ref Range   Total hemoglobin 13.1 12.0 - 16.0 g/dL   O2 Saturation 52.3 %   Carboxyhemoglobin 1.2 0.5 - 1.5 %   Methemoglobin 0.7 0.0 - 1.5 %  Carboxyhemoglobin     Status: Abnormal   Collection Time: 02/09/15  4:53 AM  Result Value Ref Range   Total hemoglobin 17.3 (H) 12.0 -  16.0 g/dL   O2 Saturation 43.6 %   Carboxyhemoglobin 1.0 0.5 - 1.5 %   Methemoglobin 0.6 0.0 - 1.5 %    Imaging: Dg Chest 2 View  02/03/2015  CLINICAL DATA:  Shortness of breath for 1 month EXAM: CHEST  2 VIEW COMPARISON:  November 11, 2014 FINDINGS: The mediastinal contour is normal. There is marked  cardiomegaly. There is a small left pleural effusion and small mole to moderate right pleural effusion. There is no pulmonary edema or focal pneumonia. The visualized skeletal structures are stable. IMPRESSION: Small left pleural effusion, small to moderate right pleural effusion. No pulmonary edema. Marked cardiomegaly. Electronically Signed   By: Abelardo Diesel M.D.   On: 02/03/2015 21:20     Tele- NSR, PVCs  Assessment/Plan:   1. Acute on chronic systolic HF: NICM with severe biventricular failure.  Noncompliant with meds at home.  EF 10-15% by echo this admit, PASP 58, severely dilated RV with severely reduced systolic function.  Seen by EP: QRS < 150 so unlikely to be good candidate for CRT.  Co-ox remains low on milrinone 0.125. CVP 4-7.  - Increase milrinone to 0.25 mcg/kg/min.  Will need home milrinone.  Repeat co-ox at noon.  - Continue digoxin, level ok.  - Continue torsemide 20 mg daily and spironolactone.  - Not many good options beyond home milrinone.  She has had poor compliance historically.  LVAD would be difficult with compliance issues and dilated/dysfunctional RV.  2. 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 PAF: In NSR on amio, Eliquis.   4. Elevated troponin: Normal cors on cath 4/16, suspect demand ischemia with volume overload.  5. Noncompliance 6. Hypokalemia: Awaiting today's BMET.  7. Disposition: Home soon, possibly this afternoon if co-ox is improved on higher milrinone dose.    Loralie Champagne 02/09/2015 7:39 AM

## 2015-02-10 DIAGNOSIS — Z452 Encounter for adjustment and management of vascular access device: Secondary | ICD-10-CM | POA: Diagnosis not present

## 2015-02-10 DIAGNOSIS — Z7901 Long term (current) use of anticoagulants: Secondary | ICD-10-CM | POA: Diagnosis not present

## 2015-02-10 DIAGNOSIS — I428 Other cardiomyopathies: Secondary | ICD-10-CM | POA: Diagnosis not present

## 2015-02-10 DIAGNOSIS — D649 Anemia, unspecified: Secondary | ICD-10-CM | POA: Diagnosis not present

## 2015-02-10 DIAGNOSIS — I480 Paroxysmal atrial fibrillation: Secondary | ICD-10-CM | POA: Diagnosis not present

## 2015-02-10 DIAGNOSIS — I5042 Chronic combined systolic (congestive) and diastolic (congestive) heart failure: Secondary | ICD-10-CM | POA: Diagnosis not present

## 2015-02-10 DIAGNOSIS — I110 Hypertensive heart disease with heart failure: Secondary | ICD-10-CM | POA: Diagnosis not present

## 2015-02-10 DIAGNOSIS — I214 Non-ST elevation (NSTEMI) myocardial infarction: Secondary | ICD-10-CM | POA: Diagnosis not present

## 2015-02-11 ENCOUNTER — Encounter (HOSPITAL_COMMUNITY): Payer: Self-pay | Admitting: Cardiology

## 2015-02-12 DIAGNOSIS — I480 Paroxysmal atrial fibrillation: Secondary | ICD-10-CM | POA: Diagnosis not present

## 2015-02-12 DIAGNOSIS — D649 Anemia, unspecified: Secondary | ICD-10-CM | POA: Diagnosis not present

## 2015-02-12 DIAGNOSIS — I428 Other cardiomyopathies: Secondary | ICD-10-CM | POA: Diagnosis not present

## 2015-02-12 DIAGNOSIS — I214 Non-ST elevation (NSTEMI) myocardial infarction: Secondary | ICD-10-CM | POA: Diagnosis not present

## 2015-02-12 DIAGNOSIS — I110 Hypertensive heart disease with heart failure: Secondary | ICD-10-CM | POA: Diagnosis not present

## 2015-02-12 DIAGNOSIS — I5042 Chronic combined systolic (congestive) and diastolic (congestive) heart failure: Secondary | ICD-10-CM | POA: Diagnosis not present

## 2015-02-14 DIAGNOSIS — D649 Anemia, unspecified: Secondary | ICD-10-CM | POA: Diagnosis not present

## 2015-02-14 DIAGNOSIS — I214 Non-ST elevation (NSTEMI) myocardial infarction: Secondary | ICD-10-CM | POA: Diagnosis not present

## 2015-02-14 DIAGNOSIS — I480 Paroxysmal atrial fibrillation: Secondary | ICD-10-CM | POA: Diagnosis not present

## 2015-02-14 DIAGNOSIS — I110 Hypertensive heart disease with heart failure: Secondary | ICD-10-CM | POA: Diagnosis not present

## 2015-02-14 DIAGNOSIS — I428 Other cardiomyopathies: Secondary | ICD-10-CM | POA: Diagnosis not present

## 2015-02-14 DIAGNOSIS — I5042 Chronic combined systolic (congestive) and diastolic (congestive) heart failure: Secondary | ICD-10-CM | POA: Diagnosis not present

## 2015-02-15 ENCOUNTER — Ambulatory Visit (HOSPITAL_COMMUNITY)
Admission: RE | Admit: 2015-02-15 | Discharge: 2015-02-15 | Disposition: A | Discharge status: Home / Self Care | Payer: Medicare Other | Source: Ambulatory Visit | Attending: Internal Medicine | Admitting: Internal Medicine

## 2015-02-15 ENCOUNTER — Encounter (HOSPITAL_COMMUNITY): Payer: Self-pay

## 2015-02-15 VITALS — BP 104/58 | HR 82 | Wt 98.0 lb

## 2015-02-15 DIAGNOSIS — I429 Cardiomyopathy, unspecified: Secondary | ICD-10-CM | POA: Insufficient documentation

## 2015-02-15 DIAGNOSIS — I509 Heart failure, unspecified: Secondary | ICD-10-CM | POA: Diagnosis not present

## 2015-02-15 DIAGNOSIS — I5022 Chronic systolic (congestive) heart failure: Secondary | ICD-10-CM | POA: Diagnosis not present

## 2015-02-15 DIAGNOSIS — Z7901 Long term (current) use of anticoagulants: Secondary | ICD-10-CM | POA: Diagnosis not present

## 2015-02-15 DIAGNOSIS — I319 Disease of pericardium, unspecified: Secondary | ICD-10-CM | POA: Diagnosis not present

## 2015-02-15 DIAGNOSIS — I313 Pericardial effusion (noninflammatory): Secondary | ICD-10-CM | POA: Insufficient documentation

## 2015-02-15 DIAGNOSIS — I10 Essential (primary) hypertension: Secondary | ICD-10-CM | POA: Diagnosis not present

## 2015-02-15 DIAGNOSIS — I4891 Unspecified atrial fibrillation: Secondary | ICD-10-CM | POA: Insufficient documentation

## 2015-02-15 DIAGNOSIS — Z79899 Other long term (current) drug therapy: Secondary | ICD-10-CM | POA: Insufficient documentation

## 2015-02-15 DIAGNOSIS — I428 Other cardiomyopathies: Secondary | ICD-10-CM

## 2015-02-15 DIAGNOSIS — I480 Paroxysmal atrial fibrillation: Secondary | ICD-10-CM | POA: Insufficient documentation

## 2015-02-15 DIAGNOSIS — Z9119 Patient's noncompliance with other medical treatment and regimen: Secondary | ICD-10-CM | POA: Insufficient documentation

## 2015-02-15 DIAGNOSIS — I3139 Other pericardial effusion (noninflammatory): Secondary | ICD-10-CM

## 2015-02-15 NOTE — Patient Instructions (Addendum)
FOLLOW UP: 4 weeks

## 2015-02-15 NOTE — Progress Notes (Signed)
Advanced Heart Failure Medication Review by a Pharmacist  Does the patient  feel that his/her medications are working for him/her?  yes  Has the patient been experiencing any side effects to the medications prescribed?  no  Does the patient measure his/her own blood pressure or blood glucose at home?  no   Does the patient have any problems obtaining medications due to transportation or finances?   no  Understanding of regimen: poor Understanding of indications: poor Potential of compliance: poor Patient understands to avoid NSAIDs. Patient understands to avoid decongestants.  Issues to address at subsequent visits: Compliance   Pharmacist comments:  Katrina Peck is a pleasant 69 yo F with a history of non-compliance. She admits to still only taking her Eliquis once daily despite our discussions about the importance of twice daily usage. She refuses being switched to any other agent. She also admits to not taking her potassium for at least 2 days because she didn't realize it was in her medication bag. I again reinforced the importance of consistent use of her medications and she verbalized understanding.   Katrina Peck. Bonnye Fava, PharmD, BCPS, CPP Clinical Pharmacist Pager: [phone] Phone: [phone] 02/15/2015 11:24 AM      Time with patient: 10 minutes Preparation and documentation time: 2 minutes Total time: 12 minutes

## 2015-02-15 NOTE — Progress Notes (Signed)
Patient ID: Katrina Peck, female   DOB: Sep 26, 1945, 69 y.o.   MRN: [id number]    ADVANCED HF CLINIC NOTE  PCP: Dr. Duanne Guess Cardiology: Dr. Shirlee Latch  69 y/o female with history of HTN who was admitted 07/20/14 with 3 months of exertional dyspnea, weight gain, cough, and orthopnea. Found to have EF 20% and a large pericardial effusion. Cath 5/16 no CAD. Hospital course complicated by atrial fibrillation w/ RVR, converted to NSR on amiodarone. Underwent pericardial window (transudate).    She had a repeat echo in 6/16 that showed persistently low LV EF at 15-20% with recurrence of moderate to large pericardial effusion without tamponade. This showed EF 10-15%, mild LV dilation, restrictive diastolic function, moderate to severely decreased RV function, and a moderate to large pericardial effusion similar to 6/16.   Admitted in November with marked volume overload. Diuresed with IV lasix. Discharged on milrinone 0.125 mcg. BB and ACE stopped. Discharge weight was 92 pounds.   She presents for post hospital follow up. Overall feeing better. Mild dyspnea with exertion. Denies PND/Orthopnea. Only taking eliquis once daily. Did not take meds today. No bleeding problems. Only t Appetite ok. [facility] following for home milrinone.   Labs  (5/16): K 4.6, creatinine 1.0 => 0.81, TSH normal, LFTs normal, TSH normal, BNP 1268 (7/16): K 5.3 => 5.4, creatinine 1.0 => 1.02, HCT 39.7, BNP > 4500 => 1082, SPEP negative, urine IFE negative (8/16): K 5, creatinine 0.83 (11/30/14): K 4.2 Creatinine 0.79  02/09/2015: K 4.5 Creatinine 1.11  SH: Married, no smoking, no ETOH.   FH: No history of cardiomyopathy or sudden death.    ROS: All systems reviewed and negative except as per HPI.   PMH: 1. Chronic systolic CHF: Nonischemic cardiomyopathy.  LHC/RHC (4/16) with no significant coronary disease; mean RA 5, PA 28/11, mean PCWP 9, CI 3.39.  Echo (4/16): EF 20% with diffuse hypokinesis, moderate RV systolic dysfunction,  large pericardial effusion.  No history of drug or ETOH abuse.  No family history of cardiomyopathy.   Echo (6/16) with EF 15-20%, restrictive diastolic function, moderate MR, moderately decreased RV systolic function, PA systolic pressure 49 mmHg, moderate-large pericardial effusion without tamponade.  Echo (7/16) with EF 10-15%, mild LV dilation, diffuse hypokinesis, restrictive diastolic dysfunction, moderate MR, moderate to severely decreased RV systolic function, moderate TR, PA systolic pressure 51 mmHg, moderate to large pericardial effusion, no change from 6/16.  Echo (8/16) with EF 10-15%, mild to moderately reduced RV systolic function, moderate pericardial effusion (slightly smaller than prior), PASP 63 mmHg.  Cardiac MRI (8/16) with LV EF 11%, RV EF 30%, mid-wall LGE involving the basal to mid septum (?myocarditis), moderate to large pericardial effusion with no evidence for tamponade.  2. Pericardial effusion: Large on 4/16 echo.  Patient had pericardial window for diagnostic and therapeutic purposes.  Cytology negative for malignancy.  6/16 echo with recurrence of moderate-large pericardial effusion without tamponade, similar repeat echo in 7/16, effusion slightly small in 8/16 by echo and MRI.  3. Atrial fibrillation: Atrial fibrillation with RVR, converted to NSR with amiodarone.   4. HTN 5. Noncompliance  Current Outpatient Prescriptions  Medication Sig Dispense Refill  . amiodarone (PACERONE) 100 MG tablet Take 1 tablet (100 mg total) by mouth daily. 30 tablet 5  . apixaban (ELIQUIS) 5 MG TABS tablet Take 1 tablet (5 mg total) by mouth 2 (two) times daily. (Patient taking differently: Take 5 mg by mouth daily. ) 60 tablet 5  . digoxin 62.5 MCG  TABS Take 0.0625 mg by mouth daily. 30 tablet 6  . milrinone (PRIMACOR) 20 MG/100ML SOLN infusion Inject 10.55 mcg/min into the vein continuous. Per  [facility] pharmacy 100 mL 6  . spironolactone (ALDACTONE) 25 MG tablet Take 0.5 tablets (12.5 mg total)  by mouth daily. 15 tablet 6  . torsemide (DEMADEX) 20 MG tablet Take 20 mg by mouth every other day.    . potassium chloride SA (K-DUR,KLOR-CON) 20 MEQ tablet Take 2 tablets (40 mEq total) by mouth daily. (Patient not taking: Reported on 02/15/2015) 60 tablet 6   No current facility-administered medications for this encounter.    No Known Allergies    Filed Vitals:   02/15/15 1109  BP: 104/58  Pulse: 82  Weight: 98 lb (44.453 kg)  SpO2: 100%    PHYSICAL EXAM: General: Elderly, chronically ill appearing.  NAD . HEENT: normal Neck: supple. JVP 7-8  cm.  Carotids 2+ bilat; no bruits. No lymphadenopathy or thryomegaly appreciated. Cor: PMI nondisplaced. Regular rate & rhythm. Paradoxical S2 split Lungs: Clear  Abdomen: soft, nontender, + distended. No hepatosplenomegaly. No bruits or masses. +BS Extremities: no cyanosis, clubbing, rash.  No edeam. RUE PICC.    Neuro: alert & oriented x 3, cranial nerves grossly intact. moves all 4 extremities w/o difficulty. Affect pleasant.  ASSESSMENT & PLAN: 1. Chronic systolic HF: Due to nonischemic cardiomyopathy.  Echo (4/16) with EF 20% and moderate RV systolic dysfunction. Cath 07/2014 with no CAD.  Echo in 6/16 showed persistent EF 15-20% with moderate RV dysfunction as well as recurrence of moderate to large pericardial effusion. Echo in 10/2014 with EF 10-15% and moderate effusion. CMRI (8/16) showed LV EF 11%, RV EF 30%, and patchy mid-wall LGE in the basal-mid septum that may suggest prior myocarditis.   Admitted earlier in November with volume overload. Due to low output she was discharged on milrinone 0.125 mcg.  NYHA II-III symptoms.  Functionally doing better. Volume status stable. Continue torsemide 20 mg daily.  Continue milrinone 0.125 mcg via PICC.  Keep off lisinopril and carvedilol.  - She has advanced heart failure with high risk of morbidity/mortality over the next 6 months but she has great difficulty following cardiology  recommendations. She is not a candidate for ICD due to the above.  2. PAF:  Regular rate. Continue amio to 100 mg daily. Check TSH LFTs. Next visit. She was told to get regular eye exams while on amiodarone.  - Continue apixaban.Reinforced twice day dosing. She continues to take once daily despite CVA risk.  3. Pericardial effusion: s/p pericardial window. Transudate, cytology negative.  Last ECHO 01/2015 EF 10-15% and moderate pericardial effusion => slightly smaller than prior, no definite tamponade.    Follow up in 4 weeks.    Katrina Cadden NP-C  11:26 AM

## 2015-02-16 DIAGNOSIS — I110 Hypertensive heart disease with heart failure: Secondary | ICD-10-CM | POA: Diagnosis not present

## 2015-02-16 DIAGNOSIS — I214 Non-ST elevation (NSTEMI) myocardial infarction: Secondary | ICD-10-CM | POA: Diagnosis not present

## 2015-02-16 DIAGNOSIS — I5042 Chronic combined systolic (congestive) and diastolic (congestive) heart failure: Secondary | ICD-10-CM | POA: Diagnosis not present

## 2015-02-16 DIAGNOSIS — I480 Paroxysmal atrial fibrillation: Secondary | ICD-10-CM | POA: Diagnosis not present

## 2015-02-16 DIAGNOSIS — I5023 Acute on chronic systolic (congestive) heart failure: Secondary | ICD-10-CM | POA: Diagnosis not present

## 2015-02-16 DIAGNOSIS — D649 Anemia, unspecified: Secondary | ICD-10-CM | POA: Diagnosis not present

## 2015-02-16 DIAGNOSIS — I428 Other cardiomyopathies: Secondary | ICD-10-CM | POA: Diagnosis not present

## 2015-02-18 ENCOUNTER — Telehealth: Payer: Self-pay | Admitting: Nurse Practitioner

## 2015-02-18 DIAGNOSIS — I110 Hypertensive heart disease with heart failure: Secondary | ICD-10-CM | POA: Diagnosis not present

## 2015-02-18 DIAGNOSIS — D649 Anemia, unspecified: Secondary | ICD-10-CM | POA: Diagnosis not present

## 2015-02-18 DIAGNOSIS — I480 Paroxysmal atrial fibrillation: Secondary | ICD-10-CM | POA: Diagnosis not present

## 2015-02-18 DIAGNOSIS — I5042 Chronic combined systolic (congestive) and diastolic (congestive) heart failure: Secondary | ICD-10-CM | POA: Diagnosis not present

## 2015-02-18 DIAGNOSIS — I428 Other cardiomyopathies: Secondary | ICD-10-CM | POA: Diagnosis not present

## 2015-02-18 DIAGNOSIS — I214 Non-ST elevation (NSTEMI) myocardial infarction: Secondary | ICD-10-CM | POA: Diagnosis not present

## 2015-02-18 NOTE — Telephone Encounter (Signed)
Phone call from [facility] - patient did not take diuretic medicine yesterday due to "going to daughter's to have Thanksgiving and did not want to pee all day". She has taken her dose of Demedex today.Her weight is now up 3 pounds and she has increased abdominal girth.  I have advised that she take an extra Demedex today and also take one tomorrow - then resume her normal schedule. Advised medication compliance is very important along with sodium restriction.   Rosalio Macadamia, RN, ANP-C [facility] [address] [place], [place]  [address] [phone]

## 2015-02-23 DIAGNOSIS — I214 Non-ST elevation (NSTEMI) myocardial infarction: Secondary | ICD-10-CM | POA: Diagnosis not present

## 2015-02-23 DIAGNOSIS — I5023 Acute on chronic systolic (congestive) heart failure: Secondary | ICD-10-CM | POA: Diagnosis not present

## 2015-02-23 DIAGNOSIS — I428 Other cardiomyopathies: Secondary | ICD-10-CM | POA: Diagnosis not present

## 2015-02-23 DIAGNOSIS — I480 Paroxysmal atrial fibrillation: Secondary | ICD-10-CM | POA: Diagnosis not present

## 2015-02-23 DIAGNOSIS — D649 Anemia, unspecified: Secondary | ICD-10-CM | POA: Diagnosis not present

## 2015-02-23 DIAGNOSIS — I110 Hypertensive heart disease with heart failure: Secondary | ICD-10-CM | POA: Diagnosis not present

## 2015-02-23 DIAGNOSIS — I5042 Chronic combined systolic (congestive) and diastolic (congestive) heart failure: Secondary | ICD-10-CM | POA: Diagnosis not present

## 2015-02-25 ENCOUNTER — Telehealth (HOSPITAL_COMMUNITY): Payer: Self-pay | Admitting: *Deleted

## 2015-02-25 DIAGNOSIS — I480 Paroxysmal atrial fibrillation: Secondary | ICD-10-CM | POA: Diagnosis not present

## 2015-02-25 DIAGNOSIS — D649 Anemia, unspecified: Secondary | ICD-10-CM | POA: Diagnosis not present

## 2015-02-25 DIAGNOSIS — I428 Other cardiomyopathies: Secondary | ICD-10-CM | POA: Diagnosis not present

## 2015-02-25 DIAGNOSIS — I214 Non-ST elevation (NSTEMI) myocardial infarction: Secondary | ICD-10-CM | POA: Diagnosis not present

## 2015-02-25 DIAGNOSIS — I110 Hypertensive heart disease with heart failure: Secondary | ICD-10-CM | POA: Diagnosis not present

## 2015-02-25 DIAGNOSIS — I5042 Chronic combined systolic (congestive) and diastolic (congestive) heart failure: Secondary | ICD-10-CM | POA: Diagnosis not present

## 2015-02-25 NOTE — Telephone Encounter (Signed)
Site looks great. When [name] RN touch at edge of site she complained of tenderness. Blood return is great, no problems. NO discoloration of arm.

## 2015-02-25 NOTE — Telephone Encounter (Signed)
No note needed 

## 2015-02-25 NOTE — Telephone Encounter (Signed)
Called patient She doesn't want to do this at this time to change PICC line to other arm

## 2015-02-25 NOTE — Telephone Encounter (Signed)
Per DM if bothering her that much we can switch the PICC to another side. Will have MaryJo call patient and ask her if she wants to have the PICC moved.

## 2015-02-25 NOTE — Telephone Encounter (Signed)
Katrina Peck HH RN  Left a message to let us know that when she went to see Katrina Peck on Wednesday, she complained about having some numbness and tingling in her forearm where the picc is placed. Pt said that she was having some difficulty lifting things. HH RN was going to assess the situation today (Friday).  Spoke with Pt about this and she said that its been going on since the 16th when it was placed. She said that she mentioned it to Katrina Peck also.  Pt states that her hand and fingers on the right arm are very numb and uncomfortable. Pt said that she feels good other than that.  Told pt that if she needed anything to let us know, and that I would forward this to Katrina and Katrina Peck just so they would know.

## 2015-02-25 NOTE — Telephone Encounter (Signed)
Have home health nurse take a look, may need to move PICC site.

## 2015-03-01 DIAGNOSIS — I4891 Unspecified atrial fibrillation: Secondary | ICD-10-CM | POA: Diagnosis not present

## 2015-03-01 DIAGNOSIS — I10 Essential (primary) hypertension: Secondary | ICD-10-CM | POA: Diagnosis not present

## 2015-03-01 DIAGNOSIS — I509 Heart failure, unspecified: Secondary | ICD-10-CM | POA: Diagnosis not present

## 2015-03-01 DIAGNOSIS — I517 Cardiomegaly: Secondary | ICD-10-CM | POA: Diagnosis not present

## 2015-03-02 DIAGNOSIS — I5042 Chronic combined systolic (congestive) and diastolic (congestive) heart failure: Secondary | ICD-10-CM | POA: Diagnosis not present

## 2015-03-02 DIAGNOSIS — I110 Hypertensive heart disease with heart failure: Secondary | ICD-10-CM | POA: Diagnosis not present

## 2015-03-02 DIAGNOSIS — I428 Other cardiomyopathies: Secondary | ICD-10-CM | POA: Diagnosis not present

## 2015-03-02 DIAGNOSIS — I214 Non-ST elevation (NSTEMI) myocardial infarction: Secondary | ICD-10-CM | POA: Diagnosis not present

## 2015-03-02 DIAGNOSIS — D649 Anemia, unspecified: Secondary | ICD-10-CM | POA: Diagnosis not present

## 2015-03-02 DIAGNOSIS — I480 Paroxysmal atrial fibrillation: Secondary | ICD-10-CM | POA: Diagnosis not present

## 2015-03-03 ENCOUNTER — Telehealth (HOSPITAL_COMMUNITY): Payer: Self-pay

## 2015-03-03 NOTE — Telephone Encounter (Signed)
Patient called CHF triage line wanting to know of anesthesia from dental procedure would be ok.  Advised to have dentist fax [place] a cardiac clearance form with specific procedure and anesthesia requirements for patient.  Aware and agreeable.  Ave Filter

## 2015-03-03 NOTE — Telephone Encounter (Signed)
Pt called [facility]G triage line to confirm if ok to have dnetal cleaning with PICC line.  Advised this would be ok per Dr. Gala Romney.  Ave Filter

## 2015-03-04 ENCOUNTER — Telehealth (HOSPITAL_COMMUNITY): Payer: Self-pay | Admitting: Cardiology

## 2015-03-04 DIAGNOSIS — I428 Other cardiomyopathies: Secondary | ICD-10-CM | POA: Diagnosis not present

## 2015-03-04 DIAGNOSIS — I110 Hypertensive heart disease with heart failure: Secondary | ICD-10-CM | POA: Diagnosis not present

## 2015-03-04 DIAGNOSIS — I5042 Chronic combined systolic (congestive) and diastolic (congestive) heart failure: Secondary | ICD-10-CM | POA: Diagnosis not present

## 2015-03-04 DIAGNOSIS — I214 Non-ST elevation (NSTEMI) myocardial infarction: Secondary | ICD-10-CM | POA: Diagnosis not present

## 2015-03-04 DIAGNOSIS — I480 Paroxysmal atrial fibrillation: Secondary | ICD-10-CM | POA: Diagnosis not present

## 2015-03-04 DIAGNOSIS — D649 Anemia, unspecified: Secondary | ICD-10-CM | POA: Diagnosis not present

## 2015-03-04 NOTE — Telephone Encounter (Signed)
Patient called with concerns regarding PICC line site Patients states area is tender to touch, painful, unable to use hand, numbness Patient evaluated by [facility] nurse in 03/04/15, per patient she was advised to have site looked at as it may have some discoloration  Patient sppoke directly with Dr. Shirlee Latch regarding options for changing, PICC sites, tunneled catheter, and d/c infusion all together  Pt was strongly advised to move PICC line sites or change to tunneled catheter. Stopping medication all together could result in being hospitalized for CHF etc  Per pt. Should would like to have PICC line removed all together and give her arms time to heal before having tunneled catheter placed  Order given to [facility] to have PICC line removed

## 2015-03-05 ENCOUNTER — Telehealth: Payer: Self-pay | Admitting: Cardiology

## 2015-03-05 DIAGNOSIS — I214 Non-ST elevation (NSTEMI) myocardial infarction: Secondary | ICD-10-CM | POA: Diagnosis not present

## 2015-03-05 DIAGNOSIS — I110 Hypertensive heart disease with heart failure: Secondary | ICD-10-CM | POA: Diagnosis not present

## 2015-03-05 DIAGNOSIS — I428 Other cardiomyopathies: Secondary | ICD-10-CM | POA: Diagnosis not present

## 2015-03-05 DIAGNOSIS — I5042 Chronic combined systolic (congestive) and diastolic (congestive) heart failure: Secondary | ICD-10-CM | POA: Diagnosis not present

## 2015-03-05 DIAGNOSIS — I480 Paroxysmal atrial fibrillation: Secondary | ICD-10-CM | POA: Diagnosis not present

## 2015-03-05 DIAGNOSIS — D649 Anemia, unspecified: Secondary | ICD-10-CM | POA: Diagnosis not present

## 2015-03-05 NOTE — Telephone Encounter (Signed)
[facility] did not have order to pull PICC line - I discussed with Dr. Shirlee Latch and while we prefer not to stop Milrinone if she insists we will.  If she becomes ill she will need to go to hospital.  She agrees.  Order given to [facility].

## 2015-03-08 ENCOUNTER — Other Ambulatory Visit (HOSPITAL_COMMUNITY): Payer: Self-pay | Admitting: Cardiology

## 2015-03-09 DIAGNOSIS — I214 Non-ST elevation (NSTEMI) myocardial infarction: Secondary | ICD-10-CM | POA: Diagnosis not present

## 2015-03-09 DIAGNOSIS — I110 Hypertensive heart disease with heart failure: Secondary | ICD-10-CM | POA: Diagnosis not present

## 2015-03-09 DIAGNOSIS — I428 Other cardiomyopathies: Secondary | ICD-10-CM | POA: Diagnosis not present

## 2015-03-09 DIAGNOSIS — D649 Anemia, unspecified: Secondary | ICD-10-CM | POA: Diagnosis not present

## 2015-03-09 DIAGNOSIS — I5042 Chronic combined systolic (congestive) and diastolic (congestive) heart failure: Secondary | ICD-10-CM | POA: Diagnosis not present

## 2015-03-09 DIAGNOSIS — I480 Paroxysmal atrial fibrillation: Secondary | ICD-10-CM | POA: Diagnosis not present

## 2015-03-11 ENCOUNTER — Other Ambulatory Visit (HOSPITAL_COMMUNITY): Payer: Self-pay | Admitting: Cardiology

## 2015-03-14 ENCOUNTER — Telehealth (HOSPITAL_COMMUNITY): Payer: Self-pay | Admitting: Vascular Surgery

## 2015-03-14 NOTE — Telephone Encounter (Signed)
Pt called to cancel appt do not want to reschedule

## 2015-03-15 ENCOUNTER — Encounter (HOSPITAL_COMMUNITY): Payer: Medicare Other

## 2015-03-17 ENCOUNTER — Telehealth (HOSPITAL_COMMUNITY): Payer: Self-pay | Admitting: Cardiology

## 2015-03-17 DIAGNOSIS — D649 Anemia, unspecified: Secondary | ICD-10-CM | POA: Diagnosis not present

## 2015-03-17 DIAGNOSIS — I214 Non-ST elevation (NSTEMI) myocardial infarction: Secondary | ICD-10-CM | POA: Diagnosis not present

## 2015-03-17 DIAGNOSIS — I5042 Chronic combined systolic (congestive) and diastolic (congestive) heart failure: Secondary | ICD-10-CM | POA: Diagnosis not present

## 2015-03-17 DIAGNOSIS — I480 Paroxysmal atrial fibrillation: Secondary | ICD-10-CM | POA: Diagnosis not present

## 2015-03-17 DIAGNOSIS — I110 Hypertensive heart disease with heart failure: Secondary | ICD-10-CM | POA: Diagnosis not present

## 2015-03-17 DIAGNOSIS — I428 Other cardiomyopathies: Secondary | ICD-10-CM | POA: Diagnosis not present

## 2015-03-17 NOTE — Telephone Encounter (Signed)
Per Tonye Becket, NP Patient should be evaluated in ER further evaluation of SOB   Pt aware via detailed voicemail and patients husband aware pts husband states, he will do the best he could to get her to come in for further evaluation

## 2015-03-17 NOTE — Telephone Encounter (Signed)
[name],RN with [facility] called with concerns after HH visit Increased SOB unable to walk 44feet Patient reports she feels very cold- oral temp 92.9 & 93.6 Weight 99 lb (last week 97) HR 98 Abdominal girth-69.5 today 71  Please advise

## 2015-03-22 ENCOUNTER — Encounter (HOSPITAL_COMMUNITY): Payer: Self-pay | Admitting: Vascular Surgery

## 2015-03-22 ENCOUNTER — Inpatient Hospital Stay (HOSPITAL_COMMUNITY)
Admission: EM | Admit: 2015-03-22 | Discharge: 2015-03-28 | DRG: 291 | Disposition: A | Discharge status: Home Health | Payer: Medicare Other | Attending: Internal Medicine | Admitting: Internal Medicine

## 2015-03-22 ENCOUNTER — Telehealth (HOSPITAL_COMMUNITY): Payer: Self-pay

## 2015-03-22 ENCOUNTER — Emergency Department (HOSPITAL_COMMUNITY): Payer: Medicare Other

## 2015-03-22 DIAGNOSIS — F411 Generalized anxiety disorder: Secondary | ICD-10-CM | POA: Diagnosis not present

## 2015-03-22 DIAGNOSIS — I2510 Atherosclerotic heart disease of native coronary artery without angina pectoris: Secondary | ICD-10-CM | POA: Diagnosis present

## 2015-03-22 DIAGNOSIS — I5043 Acute on chronic combined systolic (congestive) and diastolic (congestive) heart failure: Secondary | ICD-10-CM | POA: Diagnosis not present

## 2015-03-22 DIAGNOSIS — Z681 Body mass index (BMI) 19 or less, adult: Secondary | ICD-10-CM | POA: Diagnosis not present

## 2015-03-22 DIAGNOSIS — I214 Non-ST elevation (NSTEMI) myocardial infarction: Secondary | ICD-10-CM | POA: Diagnosis not present

## 2015-03-22 DIAGNOSIS — Z9114 Patient's other noncompliance with medication regimen: Secondary | ICD-10-CM

## 2015-03-22 DIAGNOSIS — I513 Intracardiac thrombosis, not elsewhere classified: Secondary | ICD-10-CM | POA: Diagnosis present

## 2015-03-22 DIAGNOSIS — Z452 Encounter for adjustment and management of vascular access device: Secondary | ICD-10-CM | POA: Diagnosis not present

## 2015-03-22 DIAGNOSIS — I428 Other cardiomyopathies: Secondary | ICD-10-CM | POA: Diagnosis not present

## 2015-03-22 DIAGNOSIS — R0600 Dyspnea, unspecified: Secondary | ICD-10-CM | POA: Diagnosis not present

## 2015-03-22 DIAGNOSIS — I213 ST elevation (STEMI) myocardial infarction of unspecified site: Secondary | ICD-10-CM | POA: Diagnosis not present

## 2015-03-22 DIAGNOSIS — I429 Cardiomyopathy, unspecified: Secondary | ICD-10-CM | POA: Diagnosis present

## 2015-03-22 DIAGNOSIS — Z8249 Family history of ischemic heart disease and other diseases of the circulatory system: Secondary | ICD-10-CM | POA: Diagnosis not present

## 2015-03-22 DIAGNOSIS — I5021 Acute systolic (congestive) heart failure: Secondary | ICD-10-CM | POA: Diagnosis not present

## 2015-03-22 DIAGNOSIS — I480 Paroxysmal atrial fibrillation: Secondary | ICD-10-CM | POA: Diagnosis not present

## 2015-03-22 DIAGNOSIS — E43 Unspecified severe protein-calorie malnutrition: Secondary | ICD-10-CM | POA: Diagnosis present

## 2015-03-22 DIAGNOSIS — I313 Pericardial effusion (noninflammatory): Secondary | ICD-10-CM | POA: Diagnosis present

## 2015-03-22 DIAGNOSIS — F419 Anxiety disorder, unspecified: Secondary | ICD-10-CM | POA: Diagnosis present

## 2015-03-22 DIAGNOSIS — I110 Hypertensive heart disease with heart failure: Secondary | ICD-10-CM | POA: Diagnosis not present

## 2015-03-22 DIAGNOSIS — I5042 Chronic combined systolic (congestive) and diastolic (congestive) heart failure: Secondary | ICD-10-CM | POA: Diagnosis not present

## 2015-03-22 DIAGNOSIS — Z9119 Patient's noncompliance with other medical treatment and regimen: Secondary | ICD-10-CM | POA: Diagnosis not present

## 2015-03-22 DIAGNOSIS — R079 Chest pain, unspecified: Secondary | ICD-10-CM | POA: Diagnosis not present

## 2015-03-22 DIAGNOSIS — Z833 Family history of diabetes mellitus: Secondary | ICD-10-CM

## 2015-03-22 DIAGNOSIS — I5023 Acute on chronic systolic (congestive) heart failure: Secondary | ICD-10-CM | POA: Diagnosis present

## 2015-03-22 DIAGNOSIS — I509 Heart failure, unspecified: Secondary | ICD-10-CM

## 2015-03-22 DIAGNOSIS — D649 Anemia, unspecified: Secondary | ICD-10-CM | POA: Diagnosis not present

## 2015-03-22 DIAGNOSIS — Z79899 Other long term (current) drug therapy: Secondary | ICD-10-CM

## 2015-03-22 LAB — CBC
HCT: 39.8 % (ref 36.0–46.0)
HEMOGLOBIN: 12.4 g/dL (ref 12.0–15.0)
MCH: 27.5 pg (ref 26.0–34.0)
MCHC: 31.2 g/dL (ref 30.0–36.0)
MCV: 88.2 fL (ref 78.0–100.0)
PLATELETS: 199 10*3/uL (ref 150–400)
RBC: 4.51 MIL/uL (ref 3.87–5.11)
RDW: 17 % — ABNORMAL HIGH (ref 11.5–15.5)
WBC: 8.2 10*3/uL (ref 4.0–10.5)

## 2015-03-22 LAB — BASIC METABOLIC PANEL
ANION GAP: 11 (ref 5–15)
BUN: 26 mg/dL — AB (ref 6–20)
CHLORIDE: 110 mmol/L (ref 101–111)
CO2: 21 mmol/L — AB (ref 22–32)
Calcium: 9.3 mg/dL (ref 8.9–10.3)
Creatinine, Ser: 1.04 mg/dL — ABNORMAL HIGH (ref 0.44–1.00)
GFR calc Af Amer: >60 mL/min (ref >60)
GFR, EST NON AFRICAN AMERICAN: 54 mL/min — AB (ref >60)
GLUCOSE: 113 mg/dL — AB (ref 65–99)
POTASSIUM: 4.3 mmol/L (ref 3.5–5.1)
Sodium: 142 mmol/L (ref 135–145)

## 2015-03-22 LAB — I-STAT TROPONIN, ED: Troponin i, poc: 0.03 ng/mL (ref 0.00–0.08)

## 2015-03-22 LAB — BRAIN NATRIURETIC PEPTIDE: B Natriuretic Peptide: >4500 pg/mL — ABNORMAL HIGH (ref 0.0–100.0)

## 2015-03-22 MED ORDER — FUROSEMIDE 40 MG PO TABS
40.0000 mg | ORAL_TABLET | Freq: Every day | ORAL | Status: DC
  Administered 2015-03-23: 40 mg via ORAL
  Filled 2015-03-22: qty 1

## 2015-03-22 MED ORDER — SODIUM CHLORIDE 0.9 % IV SOLN
250.0000 mL | INTRAVENOUS | Status: DC | PRN
  Administered 2015-03-24: 250 mL via INTRAVENOUS

## 2015-03-22 MED ORDER — ACETAMINOPHEN 325 MG PO TABS
650.0000 mg | ORAL_TABLET | ORAL | Status: DC | PRN
  Administered 2015-03-25: 650 mg via ORAL
  Filled 2015-03-22: qty 2

## 2015-03-22 MED ORDER — SODIUM CHLORIDE 0.9 % IJ SOLN
3.0000 mL | Freq: Two times a day (BID) | INTRAMUSCULAR | Status: DC
  Administered 2015-03-23 – 2015-03-28 (×5): 3 mL via INTRAVENOUS

## 2015-03-22 MED ORDER — AMIODARONE HCL 100 MG PO TABS
100.0000 mg | ORAL_TABLET | Freq: Every day | ORAL | Status: DC
  Administered 2015-03-23 – 2015-03-28 (×6): 100 mg via ORAL
  Filled 2015-03-22 (×6): qty 1

## 2015-03-22 MED ORDER — APIXABAN 5 MG PO TABS
5.0000 mg | ORAL_TABLET | Freq: Two times a day (BID) | ORAL | Status: DC
  Administered 2015-03-23: 5 mg via ORAL
  Filled 2015-03-22: qty 1

## 2015-03-22 MED ORDER — DIGOXIN 125 MCG PO TABS
0.0625 mg | ORAL_TABLET | Freq: Every day | ORAL | Status: DC
  Administered 2015-03-23 – 2015-03-28 (×6): 0.0625 mg via ORAL
  Filled 2015-03-22 (×6): qty 1

## 2015-03-22 MED ORDER — NITROGLYCERIN IN D5W 200-5 MCG/ML-% IV SOLN
5.0000 ug/min | Freq: Once | INTRAVENOUS | Status: AC
  Administered 2015-03-22: 5 ug/min via INTRAVENOUS
  Filled 2015-03-22: qty 250

## 2015-03-22 MED ORDER — SODIUM CHLORIDE 0.9 % IJ SOLN: 3.0000 mL | INTRAMUSCULAR | Status: DC | PRN

## 2015-03-22 MED ORDER — ASPIRIN EC 81 MG PO TBEC
81.0000 mg | DELAYED_RELEASE_TABLET | Freq: Once | ORAL | Status: AC
  Administered 2015-03-23: 81 mg via ORAL
  Filled 2015-03-22: qty 1

## 2015-03-22 MED ORDER — FUROSEMIDE 10 MG/ML IJ SOLN
40.0000 mg | INTRAMUSCULAR | Status: AC
  Administered 2015-03-22: 40 mg via INTRAVENOUS
  Filled 2015-03-22: qty 4

## 2015-03-22 MED ORDER — SPIRONOLACTONE 12.5 MG HALF TABLET
12.5000 mg | ORAL_TABLET | Freq: Every day | ORAL | Status: DC
  Administered 2015-03-23 – 2015-03-28 (×6): 12.5 mg via ORAL
  Filled 2015-03-22 (×7): qty 1

## 2015-03-22 MED ORDER — ONDANSETRON HCL 4 MG/2ML IJ SOLN: 4.0000 mg | Freq: Four times a day (QID) | INTRAMUSCULAR | Status: DC | PRN

## 2015-03-22 NOTE — H&P (Signed)
History and Physical  ROSELENE GRAY EAV:[id number] DOB: 1945/05/06 DOA: 03/22/2015  PCP: Maryelizabeth Rowan, MD   Chief Complaint: Dyspnea  History of Present Illness:  - Patient is a 69 yo female with history of HTN, CHF, afib, pericardial effusion who was brought to the ER with cc of dyspnea that got particularly worse the past 2 days associated with orthopnea, PND and LE edema.  - She denies palpitations or chest pain but has chest tightness. She denies cough. No fever, chills or wheezing. She said she is complaint with her medications taking torsemide every other day.    Review of Systems:  CONSTITUTIONAL:  No night sweats.  + fatigue.  No fever or chills. Eyes:  No visual changes.  No eye pain.  No eye discharge.   ENT:    No epistaxis.  No sinus pain.  No sore throat.  No ear pain.  No congestion. RESPIRATORY:    + shortness of breath. CARDIOVASCULAR:  No chest pains.  No palpitations. GASTROINTESTINAL:  No abdominal pain.  No nausea or vomiting.  No diarrhea or constipation.  No hematemesis.  No hematochezia.  No melena. GENITOURINARY:  No urgency.  No frequency.  No dysuria.  No hematuria.  No obstructive symptoms.  No discharge.  No pain.  No significant abnormal bleeding. MUSCULOSKELETAL:  No musculoskeletal pain.  No joint swelling.  No arthritis. NEUROLOGICAL:  No confusion.  No weakness. No headache. No seizure. PSYCHIATRIC:  No depression. No anxiety. No suicidal ideation. SKIN:  No rashes.  No lesions.  No wounds. ENDOCRINE:  No unexplained weight loss.  No polydipsia.  No polyuria.  No polyphagia. HEMATOLOGIC:  No anemia.  No purpura.  No petechiae.  No bleeding.  ALLERGIC AND IMMUNOLOGIC:  No pruritus.  No swelling Other:  Past Medical and Surgical History:   Past Medical History  Diagnosis Date  . Anemia   . Hypertension   . Blood transfusion without reported diagnosis   . Chronic systolic CHF (congestive heart failure) (HCC)     a. 06/2014 Echo: EF  20%.  Marland Kitchen NICM (nonischemic cardiomyopathy) (HCC)     a. 06/2014 Echo: EF 20%, diff HK, mild MR, mildly dil LA/RA, mildly reduced RV fxn;  b. 06/2014 Cath: nl cors.  . Pericardial effusion     a. 06/2014 Large effusion, no tamponade phys.   Past Surgical History  Procedure Laterality Date  . No past surgeries    . Left and right heart catheterization with coronary angiogram N/A 07/23/2014    Procedure: LEFT AND RIGHT HEART CATHETERIZATION WITH CORONARY ANGIOGRAM;  Surgeon: Laurey Morale, MD;  Location[facility] CATH LAB;  Service: Cardiovascular;  Laterality: N/A;  . Subxyphoid pericardial window N/A 07/26/2014    Procedure: SUBXYPHOID PERICARDIAL WINDOW;  Surgeon: Delight Ovens, MD;  Location: [facility] OR;  Service: Thoracic;  Laterality: N/A;  . Tee without cardioversion N/A 07/26/2014    Procedure: TRANSESOPHAGEAL ECHOCARDIOGRAM (TEE);  Surgeon: Delight Ovens, MD;  Location[facility] OR;  Service: Thoracic;  Laterality: N/A;  . Pleural effusion drainage N/A 07/26/2014    Procedure: DRAINAGE OF PERICARDIAL EFFUSION;  Surgeon: Delight Ovens, MD;  Location: [facility] OR;  Service: Thoracic;  Laterality: N/A;  . Cardiac catheterization N/A 02/07/2015    Procedure: Right Heart Cath;  Surgeon: Laurey Morale, MD;  Location: [facility] INVASIVE CV LAB;  Service: Cardiovascular;  Laterality: N/A;    Social History:   reports that she has never smoked. She has never used smokeless tobacco. She  reports that she does not drink alcohol or use illicit drugs.   No Known Allergies  Family History  Problem Relation Age of Onset  . Hypertension Sister   . Diabetes Maternal Grandmother   . Cancer Mother     died young (pt was only in [employer] @ the time)  . Other Father     died in his 60's.    Prior to Admission medications   Medication Sig Start Date End Date Taking? Authorizing Provider  amiodarone (PACERONE) 100 MG tablet Take 1 tablet (100 mg total) by mouth daily. 11/30/14  Yes Amy D Clegg, NP  apixaban (ELIQUIS)  5 MG TABS tablet Take 1 tablet (5 mg total) by mouth 2 (two) times daily. Patient taking differently: Take 5 mg by mouth daily.  07/30/14  Yes Brittainy Sherlynn Carbon, PA-C  digoxin 62.5 MCG TABS Take 0.0625 mg by mouth daily. 02/09/15  Yes Amy D Clegg, NP  milrinone (PRIMACOR) 20 MG/100ML SOLN infusion Inject 10.55 mcg/min into the vein continuous. Per  [facility] pharmacy 02/09/15  Yes Amy Georgie Chard, NP  spironolactone (ALDACTONE) 25 MG tablet Take 0.5 tablets (12.5 mg total) by mouth daily. 02/09/15  Yes Amy D Clegg, NP  torsemide (DEMADEX) 20 MG tablet Take 20 mg by mouth every other day.   Yes Historical Provider, MD  potassium chloride SA (K-DUR,KLOR-CON) 20 MEQ tablet Take 2 tablets (40 mEq total) by mouth daily. Patient not taking: Reported on 02/15/2015 02/09/15   Amy D Filbert Schilder, NP    Physical Exam: BP 133/119 mmHg  Pulse 94  Temp(Src) 97.7 F (36.5 C) (Oral)  Resp 29  SpO2 100%  GENERAL : Well developed, well nourished, alert and cooperative, and appears to be in mild acute distress. HEAD: normocephalic. EYES: PERRL, EOMI. Vision is grossly intact. NOSE: No nasal discharge. THROAT: Oral cavity and pharynx normal.  NECK: Neck supple. CARDIAC: Normal S1 and S2. No S3, S4 or murmurs. Rhythm is regular. There is mild peripheral edema. LUNGS: Clear to auscultation. ABDOMEN: Positive bowel sounds. Soft, nondistended, nontender. EXTREMITIES: No significant deformity or joint abnormality. NEUROLOGICAL: The mental examination revealed the patient was oriented to person, place, and time.CN II-XII intact. SKIN: Skin normal color. PSYCHIATRIC:  The patient was able to demonstrate good judgement and reason, without hallucinations, abnormal affect or abnormal behaviors during the examination. Patient is not suicidal.          Labs on Admission:  Reviewed.   Radiological Exams on Admission: Dg Chest 2 View  03/22/2015  CLINICAL DATA:  Chest tightness and history of hypertension. EXAM: CHEST  2 VIEW  COMPARISON:  02/03/2015 FINDINGS: Midline trachea. Moderate cardiomegaly. Small right pleural effusion is similar. The left pleural effusion is resolved. No pneumothorax. No congestive failure. Worsened right base airspace disease. IMPRESSION: Similar small right pleural effusion with increased adjacent airspace disease. Favor atelectasis. Early infection cannot be excluded. Cardiomegaly without congestive failure. Resolved left pleural effusion. Electronically Signed   By: Jeronimo Greaves M.D.   On: 03/22/2015 17:35    EKG:  Independently reviewed. No acute ST/T changes comparing to prior EKG.   Assessment/Plan  Acute on chronic systolic HF: Non ischemic LHC showed non obstructive CAD in April 2016 Will continue ACS r/o and admit to tele Started on Nitro drip , given lasix 40 mg IV Will switch to lasix 40 mg PO starting tmw Continue Aldactone.  Not clear why she is not on ACEI or BB.  Continue Dig  Paroxysmal Afib: currently in  NSR, continue Amiodarone and Eliquis.    DVT prophylaxis: On Eliquis  Consultants: Cardio in am  Code Status: Full    Eston Esters M.D [facility]

## 2015-03-22 NOTE — Telephone Encounter (Signed)
Katrina HH RN with [facility] called c/o patient doing worse than last week when RN called CHF triage line. Since then, patient never went to ED as advised over the phone per Amy Clegg NP-C. Temp still around 34 F orally, patient feels cold to touch, nail beds all white and non blanching.  RN unable to get pulse ox reading anywhere.  Patient generally "just looks bad". This RN attempted to speak to patient over the phone and husband answered.  Patient refuses to go to ED or make appointment with CHF clinic, and husband "will not force her to do anything she does not want to do".  Could not get patient on the phone to speak directly to her.  Strongly encouraged husband to call CHF triage line back or go to ED when/if patient feels comfortable and that this is best for her best interests.  Ave Filter

## 2015-03-22 NOTE — ED Notes (Signed)
Pt ambualted in hallway wit one staff assist. Pt very SOB with exertion, out of breath. O2 saturations remained 100%. HR 100, RR 35

## 2015-03-22 NOTE — ED Provider Notes (Signed)
CSN: [id number]     Arrival date & time 03/22/15  1626 History   First MD Initiated Contact with Patient 03/22/15 1957     Chief Complaint  Patient presents with  . Shortness of Breath   Katrina Peck is a 69 y.o. female with a history of systolic CHF, nonischemic cardiomyopathy, hypertension and anemia who presents to the emergency department complaining of worsening shortness of breath over the past 3-4 weeks it is worsened especially today. She reports worsening dyspnea on exertion today. She reports she is only able to take 4-5 steps without becoming extremely short of breath and having to rest. She also reports slightly increased cough today. She reports that she was on a milrinone infusion but this was stopped approximately 3 weeks ago. She reports feels like she's been getting worse since then. She reports she's been compliant in taking her torsemide. The patient denies fevers, chills, chest pain, palpitations, hemoptysis, abdominal pain, nausea, vomiting, diarrhea, or syncope.   (Consider location/radiation/quality/duration/timing/severity/associated sxs/prior Treatment) HPI  Past Medical History  Diagnosis Date  . Anemia   . Hypertension   . Blood transfusion without reported diagnosis   . Chronic systolic CHF (congestive heart failure) (HCC)     a. 06/2014 Echo: EF 20%.  Marland Kitchen NICM (nonischemic cardiomyopathy) (HCC)     a. 06/2014 Echo: EF 20%, diff HK, mild MR, mildly dil LA/RA, mildly reduced RV fxn;  b. 06/2014 Cath: nl cors.  . Pericardial effusion     a. 06/2014 Large effusion, no tamponade phys.   Past Surgical History  Procedure Laterality Date  . No past surgeries    . Left and right heart catheterization with coronary angiogram N/A 07/23/2014    Procedure: LEFT AND RIGHT HEART CATHETERIZATION WITH CORONARY ANGIOGRAM;  Surgeon: Laurey Morale, MD;  Location: [facility] CATH LAB;  Service: Cardiovascular;  Laterality: N/A;  . Subxyphoid pericardial window N/A 07/26/2014    Procedure:  SUBXYPHOID PERICARDIAL WINDOW;  Surgeon: Delight Ovens, MD;  Location: [facility] OR;  Service: Thoracic;  Laterality: N/A;  . Tee without cardioversion N/A 07/26/2014    Procedure: TRANSESOPHAGEAL ECHOCARDIOGRAM (TEE);  Surgeon: Delight Ovens, MD;  Location: [facility] OR;  Service: Thoracic;  Laterality: N/A;  . Pleural effusion drainage N/A 07/26/2014    Procedure: DRAINAGE OF PERICARDIAL EFFUSION;  Surgeon: Delight Ovens, MD;  Location: [facility] OR;  Service: Thoracic;  Laterality: N/A;  . Cardiac catheterization N/A 02/07/2015    Procedure: Right Heart Cath;  Surgeon: Laurey Morale, MD;  Location: [facility] INVASIVE CV LAB;  Service: Cardiovascular;  Laterality: N/A;   Family History  Problem Relation Age of Onset  . Hypertension Sister   . Diabetes Maternal Grandmother   . Cancer Mother     died young (pt was only in [employer] @ the time)  . Other Father     died in his 15's.   Social History  Substance Use Topics  . Smoking status: Never Smoker   . Smokeless tobacco: Never Used  . Alcohol Use: No   OB History    No data available     Review of Systems  Constitutional: Positive for fatigue. Negative for fever and chills.  HENT: Negative for congestion and sore throat.   Eyes: Negative for visual disturbance.  Respiratory: Positive for cough and shortness of breath. Negative for wheezing.   Cardiovascular: Positive for leg swelling. Negative for chest pain and palpitations.  Gastrointestinal: Negative for nausea, vomiting, abdominal pain and diarrhea.  Genitourinary:  Negative for dysuria.  Musculoskeletal: Negative for back pain and neck pain.  Skin: Negative for rash.  Neurological: Negative for headaches.      Allergies  Review of patient's allergies indicates no known allergies.  Home Medications   Prior to Admission medications   Medication Sig Start Date End Date Taking? Authorizing Provider  amiodarone (PACERONE) 100 MG tablet Take 1 tablet (100 mg total) by mouth daily.  11/30/14  Yes Amy D Clegg, NP  apixaban (ELIQUIS) 5 MG TABS tablet Take 1 tablet (5 mg total) by mouth 2 (two) times daily. Patient taking differently: Take 5 mg by mouth daily.  07/30/14  Yes Brittainy Sherlynn Carbon, PA-C  digoxin 62.5 MCG TABS Take 0.0625 mg by mouth daily. 02/09/15  Yes Amy D Clegg, NP  milrinone (PRIMACOR) 20 MG/100ML SOLN infusion Inject 10.55 mcg/min into the vein continuous. Per  [facility] pharmacy 02/09/15  Yes Amy Georgie Chard, NP  spironolactone (ALDACTONE) 25 MG tablet Take 0.5 tablets (12.5 mg total) by mouth daily. 02/09/15  Yes Amy D Clegg, NP  torsemide (DEMADEX) 20 MG tablet Take 20 mg by mouth every other day.   Yes Historical Provider, MD  potassium chloride SA (K-DUR,KLOR-CON) 20 MEQ tablet Take 2 tablets (40 mEq total) by mouth daily. Patient not taking: Reported on 02/15/2015 02/09/15   Amy D Clegg, NP   BP 144/113 mmHg  Pulse 94  Temp(Src) 97.7 F (36.5 C) (Oral)  Resp 22  SpO2 100% Physical Exam  Constitutional: She appears well-developed and well-nourished. No distress.  HENT:  Head: Normocephalic and atraumatic.  Mouth/Throat: Oropharynx is clear and moist.  Eyes: Conjunctivae are normal. Pupils are equal, round, and reactive to light. Right eye exhibits no discharge. Left eye exhibits no discharge.  Neck: Neck supple.  Cardiovascular: Normal rate, regular rhythm, normal heart sounds and intact distal pulses.  Exam reveals no gallop and no friction rub.   Bilateral radial, posterior tibialis and dorsalis pedis pulses are intact.    Pulmonary/Chest: Effort normal. No respiratory distress. She has no wheezes.  Diminished lung sounds right base with bilateral crackles. Patient is slightly tachypneic with a respiratory rate of 30. Oxygen saturation 100% on room air at rest.  Abdominal: Soft. She exhibits no distension. There is no tenderness.  Musculoskeletal: She exhibits edema. She exhibits no tenderness.  Mild bilateral pedal and ankle edema. No calf edema or  tenderness.  Lymphadenopathy:    She has no cervical adenopathy.  Neurological: She is alert. Coordination normal.  Skin: Skin is warm and dry. No rash noted. She is not diaphoretic. No erythema. No pallor.  Psychiatric: She has a normal mood and affect. Her behavior is normal.  Nursing note and vitals reviewed.   ED Course  Procedures (including critical care time) Labs Review Labs Reviewed  BASIC METABOLIC PANEL - Abnormal; Notable for the following:    CO2 21 (*)    Glucose, Bld 113 (*)    BUN 26 (*)    Creatinine, Ser 1.04 (*)    GFR calc non Af Amer 54 (*)    All other components within normal limits  CBC - Abnormal; Notable for the following:    RDW 17.0 (*)    All other components within normal limits  BRAIN NATRIURETIC PEPTIDE - Abnormal; Notable for the following:    B Natriuretic Peptide >4500.0 (*)    All other components within normal limits  Rosezena Sensor, ED    Imaging Review Dg Chest 2 View  03/22/2015  CLINICAL DATA:  Chest tightness and history of hypertension. EXAM: CHEST  2 VIEW COMPARISON:  02/03/2015 FINDINGS: Midline trachea. Moderate cardiomegaly. Small right pleural effusion is similar. The left pleural effusion is resolved. No pneumothorax. No congestive failure. Worsened right base airspace disease. IMPRESSION: Similar small right pleural effusion with increased adjacent airspace disease. Favor atelectasis. Early infection cannot be excluded. Cardiomegaly without congestive failure. Resolved left pleural effusion. Electronically Signed   By: Jeronimo Greaves M.D.   On: 03/22/2015 17:35   I have personally reviewed and evaluated these images and lab results as part of my medical decision-making.   EKG Interpretation   Date/Time:  Tuesday March 22 2015 16:31:23 EST Ventricular Rate:  92 PR Interval:  158 QRS Duration: 142 QT Interval:  444 QTC Calculation: 549 R Axis:   77 Text Interpretation:  Normal sinus rhythm Non-specific intra-ventricular   conduction block T wave abnormality, consider inferior ischemia Abnormal  ECG Confirmed by Ranae Palms  MD, DAVID ([id number]) on 03/22/2015 10:31:29 PM      MDM   Meds given in ED:  Medications  furosemide (LASIX) injection 40 mg (40 mg Intravenous Given 03/22/15 2113)  nitroGLYCERIN 50 mg in dextrose 5 % 250 mL (0.2 mg/mL) infusion (15 mcg/min Intravenous Rate/Dose Change 03/22/15 2208)    New Prescriptions   No medications on file    Final diagnoses:  Acute on chronic systolic congestive heart failure (HCC)   This is a 69 y.o. female with a history of systolic CHF, nonischemic cardiomyopathy, hypertension and anemia who presents to the emergency department complaining of worsening shortness of breath over the past 3-4 weeks it is worsened especially today. She reports worsening dyspnea on exertion today. She reports she is only able to take 4-5 steps without becoming extremely short of breath and having to rest. She also reports slightly increased cough today. On exam the patient is afebrile nontoxic-appearing. Patient is tachypneic with a respiratory rate of 30. She is hypertensive. Oxygen saturation is 100% on room air. She is diminished lung sounds right base with bilateral crackles. Suspect CHF exacerbation. Minimal bilateral lower extremity edema which is symmetric. Chest x-ray shows a right pleural effusion. CBC shows no leukocytes ptosis. Troponin is 0.03. BMP shows mildly elevated creatinine of 1.04. BNP is greater than 4500. Patient given 40 mg IV Lasix. After consultation with Dr. Ranae Palms will start on nitro drip. After start of nitro drip patient's breathing improved. Patient tolerated food in the emergency department. Will admit to stepdown. Patient is in agreement with admission. Patient accepted for admission by Dr. Mickle Mallory. Temporary admission orders for stepdown placed.  This patient was discussed with and evaluated by Dr. Ranae Palms who agrees with assessment and  plan.   CRITICAL CARE Performed by: Lawana Chambers   Total critical care time: 35 minutes  Critical care time was exclusive of separately billable procedures and treating other patients.  Critical care was necessary to treat or prevent imminent or life-threatening deterioration.  Critical care was time spent personally by me on the following activities: development of treatment plan with patient and/or surrogate as well as nursing, discussions with consultants, evaluation of patient's response to treatment, examination of patient, obtaining history from patient or surrogate, ordering and performing treatments and interventions, ordering and review of laboratory studies, ordering and review of radiographic studies, pulse oximetry and re-evaluation of patient's condition.   Everlene Farrier, PA-C 03/23/15 1914  Loren Racer, MD 03/26/15 [phone]

## 2015-03-22 NOTE — ED Notes (Signed)
Attempted report 

## 2015-03-22 NOTE — ED Notes (Signed)
Pt reports to the ED for eval of SOB and chest tightness x several months. SOB and chest tightness worse with minimal exertion. She has a hx of CHF and she reports she has been struggling with this SOB for months. Seen by PCP but still having problems. She has a hx of CHF. Has been compliant with her Lasix. Pt A&Ox4, resp e/u, and skin warm and dry.

## 2015-03-23 ENCOUNTER — Inpatient Hospital Stay (HOSPITAL_COMMUNITY): Payer: Medicare Other

## 2015-03-23 DIAGNOSIS — I5021 Acute systolic (congestive) heart failure: Secondary | ICD-10-CM

## 2015-03-23 DIAGNOSIS — R0600 Dyspnea, unspecified: Secondary | ICD-10-CM

## 2015-03-23 DIAGNOSIS — F411 Generalized anxiety disorder: Secondary | ICD-10-CM

## 2015-03-23 DIAGNOSIS — I5043 Acute on chronic combined systolic (congestive) and diastolic (congestive) heart failure: Secondary | ICD-10-CM

## 2015-03-23 DIAGNOSIS — I480 Paroxysmal atrial fibrillation: Secondary | ICD-10-CM

## 2015-03-23 LAB — BASIC METABOLIC PANEL
ANION GAP: 10 (ref 5–15)
BUN: 27 mg/dL — ABNORMAL HIGH (ref 6–20)
CO2: 20 mmol/L — ABNORMAL LOW (ref 22–32)
Calcium: 9 mg/dL (ref 8.9–10.3)
Chloride: 109 mmol/L (ref 101–111)
Creatinine, Ser: 1.11 mg/dL — ABNORMAL HIGH (ref 0.44–1.00)
GFR calc Af Amer: 58 mL/min — ABNORMAL LOW (ref >60)
GFR, EST NON AFRICAN AMERICAN: 50 mL/min — AB (ref >60)
Glucose, Bld: 115 mg/dL — ABNORMAL HIGH (ref 65–99)
POTASSIUM: 3.7 mmol/L (ref 3.5–5.1)
SODIUM: 139 mmol/L (ref 135–145)

## 2015-03-23 LAB — TROPONIN I

## 2015-03-23 LAB — BLOOD GAS, ARTERIAL
ACID-BASE DEFICIT: 4.2 mmol/L — AB (ref 0.0–2.0)
BICARBONATE: 18.7 meq/L — AB (ref 20.0–24.0)
DRAWN BY: 235881
FIO2: 0.21
O2 Saturation: 97.3 %
PATIENT TEMPERATURE: 98.6
PCO2 ART: 25.6 mmHg — AB (ref 35.0–45.0)
TCO2: 19.5 mmol/L (ref 0–100)
pH, Arterial: 7.477 — ABNORMAL HIGH (ref 7.350–7.450)
pO2, Arterial: 95.6 mmHg (ref 80.0–100.0)

## 2015-03-23 LAB — PROTIME-INR
INR: 1.44 (ref 0.00–1.49)
Prothrombin Time: 17.6 seconds — ABNORMAL HIGH (ref 11.6–15.2)

## 2015-03-23 LAB — MRSA PCR SCREENING: MRSA BY PCR: NEGATIVE

## 2015-03-23 MED ORDER — WARFARIN VIDEO
Freq: Once | Status: AC
  Administered 2015-03-25: 10:00:00

## 2015-03-23 MED ORDER — RIVAROXABAN 15 MG PO TABS: 15.0000 mg | ORAL_TABLET | Freq: Every day | ORAL | Status: DC

## 2015-03-23 MED ORDER — WARFARIN - PHARMACIST DOSING INPATIENT: Freq: Every day | Status: DC

## 2015-03-23 MED ORDER — MILRINONE IN DEXTROSE 20 MG/100ML IV SOLN
0.2500 ug/kg/min | INTRAVENOUS | Status: DC
  Administered 2015-03-23 – 2015-03-27 (×4): 0.25 ug/kg/min via INTRAVENOUS
  Filled 2015-03-23 (×5): qty 100

## 2015-03-23 MED ORDER — POTASSIUM CHLORIDE CRYS ER 20 MEQ PO TBCR
40.0000 meq | EXTENDED_RELEASE_TABLET | Freq: Once | ORAL | Status: AC
  Administered 2015-03-23: 40 meq via ORAL
  Filled 2015-03-23: qty 2

## 2015-03-23 MED ORDER — COUMADIN BOOK
Freq: Once | Status: AC
  Administered 2015-03-24
  Filled 2015-03-23: qty 1

## 2015-03-23 MED ORDER — FUROSEMIDE 10 MG/ML IJ SOLN
80.0000 mg | Freq: Once | INTRAMUSCULAR | Status: AC
  Administered 2015-03-23: 80 mg via INTRAVENOUS

## 2015-03-23 MED ORDER — WARFARIN SODIUM 5 MG PO TABS
5.0000 mg | ORAL_TABLET | Freq: Once | ORAL | Status: AC
  Administered 2015-03-24: 5 mg via ORAL
  Filled 2015-03-23: qty 1

## 2015-03-23 MED ORDER — HEPARIN BOLUS VIA INFUSION
2500.0000 [IU] | Freq: Once | INTRAVENOUS | Status: AC
  Administered 2015-03-23: 2500 [IU] via INTRAVENOUS
  Filled 2015-03-23: qty 2500

## 2015-03-23 MED ORDER — FUROSEMIDE 10 MG/ML IJ SOLN
80.0000 mg | Freq: Two times a day (BID) | INTRAMUSCULAR | Status: DC
  Administered 2015-03-23: 80 mg via INTRAVENOUS
  Filled 2015-03-23 (×2): qty 8

## 2015-03-23 MED ORDER — HEPARIN (PORCINE) IN NACL 100-0.45 UNIT/ML-% IJ SOLN
700.0000 [IU]/h | INTRAMUSCULAR | Status: DC
  Administered 2015-03-23: 550 [IU]/h via INTRAVENOUS
  Filled 2015-03-23: qty 250

## 2015-03-23 MED ORDER — FUROSEMIDE 10 MG/ML IJ SOLN
INTRAMUSCULAR | Status: AC
  Administered 2015-03-23: 80 mg via INTRAVENOUS
  Filled 2015-03-23: qty 8

## 2015-03-23 NOTE — Plan of Care (Signed)
Problem: Education: Goal: Knowledge of Katrina Peck General Education information/materials will improve Outcome: Progressing Plan of care reviewed with patient.  RN provided orientation pertaining to use of call light and hospital phone.  RN instructed patient to call RN/tech directly or to use call light whenever assistance is needed.  RN instructed patient to call for assistance and wait for assistance anytime she needed to get out of bed and or needed to use the bed pan.  Patient denied pain during RN assessment.  RN instructed patient that if she started having pain to notify staff immediately.

## 2015-03-23 NOTE — Progress Notes (Addendum)
ANTICOAGULATION CONSULT NOTE - Initial Consult  Pharmacy Consult for heparin, coumadin Indication: LV thrombus  No Known Allergies  Patient Measurements: Height: 5\' 2"  (157.5 cm) Weight: 102 lb 6.4 oz (46.448 kg) IBW/kg (Calculated) : 50.1  Vital Signs: Temp: 97.6 F (36.4 C) (12/28 1632) Temp Source: Oral (12/28 1632) BP: 115/85 mmHg (12/28 1632) Pulse Rate: 74 (12/28 1632)  Labs:  Recent Labs  03/22/15 1717 03/23/15 0432  HGB 12.4  --   HCT 39.8  --   PLT 199  --   CREATININE 1.04* 1.11*  TROPONINI  --  <0.03    Estimated Creatinine Clearance: 35.5 mL/min (by C-G formula based on Cr of 1.11).   Medical History: Past Medical History  Diagnosis Date  . Anemia   . Hypertension   . Blood transfusion without reported diagnosis   . Chronic systolic CHF (congestive heart failure) (HCC)     a. 06/2014 Echo: EF 20%.  Marland Kitchen NICM (nonischemic cardiomyopathy) (HCC)     a. 06/2014 Echo: EF 20%, diff HK, mild MR, mildly dil LA/RA, mildly reduced RV fxn;  b. 06/2014 Cath: nl cors.  . Pericardial effusion     a. 06/2014 Large effusion, no tamponade phys.    Medications:  Prescriptions prior to admission  Medication Sig Dispense Refill Last Dose  . amiodarone (PACERONE) 100 MG tablet Take 1 tablet (100 mg total) by mouth daily. 30 tablet 5 unknown at unknown  . apixaban (ELIQUIS) 5 MG TABS tablet Take 1 tablet (5 mg total) by mouth 2 (two) times daily. (Patient taking differently: Take 5 mg by mouth daily. ) 60 tablet 5 unknown at unknown  . digoxin 62.5 MCG TABS Take 0.0625 mg by mouth daily. 30 tablet 6 unknown at unknown  . milrinone (PRIMACOR) 20 MG/100ML SOLN infusion Inject 10.55 mcg/min into the vein continuous. Per  [facility] pharmacy 100 mL 6 unknown at unknown  . spironolactone (ALDACTONE) 25 MG tablet Take 0.5 tablets (12.5 mg total) by mouth daily. 15 tablet 6 unknown at unknown  . torsemide (DEMADEX) 20 MG tablet Take 20 mg by mouth every other day.   unknown at unknown  .  potassium chloride SA (K-DUR,KLOR-CON) 20 MEQ tablet Take 2 tablets (40 mEq total) by mouth daily. (Patient not taking: Reported on 02/15/2015) 60 tablet 6 Not Taking   Scheduled:  . amiodarone  100 mg Oral Daily  . digoxin  0.0625 mg Oral Daily  . furosemide  80 mg Intravenous BID  . sodium chloride  3 mL Intravenous Q12H  . spironolactone  12.5 mg Oral Daily    Assessment: 69 yo female with afib on apixiban PTA and now noted with LV thrombus (unable to use NOACs). Pharmacy has been consulted to dose heparin/coumadin. Last dose of apixiban is not known.  -She is noted for tunneled catheter placement (for home milrinone).  Per Dr. Shirlee Latch ok to change to lovenox post procedure -baseline INR= 1.44  Goal of Therapy:  Heparin level 0.3-0.7 units/ml Monitor platelets by anticoagulation protocol: Yes   Plan:   -Coumadin 5mg  po today -Daily PT/INR --Heparin bolus 2500 units IV followed by 550 units/hr (~12 units/kg/hr) -Heparin level in 8 hours and daily wth CBC daily  Harland German, Pharm D 03/23/2015 6:13 PM

## 2015-03-23 NOTE — Progress Notes (Signed)
Note on apixaban:  After talking to her, I think she has been taking the apixaban 5mg  once a day instead of BID. I stressed the importance that it must be taken BID.   Ulyses Southward, PharmD Pager: [phone] 03/23/2015 10:18 AM

## 2015-03-23 NOTE — Progress Notes (Signed)
Bipap not inititated at this time , pt in no distress rr 22-26. ABG results ok. MD agreed to hold bipap pending abg results.

## 2015-03-23 NOTE — Progress Notes (Signed)
Pharmacy consulted to switch patient from apixaban to rivaroxaban for her afib. Last apixaban dose given this morning at 1007. Will dose xarelto 15mg  daily for CrCl < 16ml/min. Case management consult ordered.  Louie Casa, PharmD, BCPS 03/23/2015, 4:31 PM

## 2015-03-23 NOTE — Consult Note (Addendum)
Advanced Heart Failure Team Consult Note  Referring Physician: Dr Butler Denmark Primary Physician: Dr Maryelizabeth Rowan Primary Cardiologist:  Dr. Shirlee Latch   Reason for Consultation: A/C systolic HF  HPI:    Katrina Peck is a 69 y.o. female with hx of Chronic systolic HF due to NICM (EF 10-15% by Echo 02/04/15), PAF, hx of pericardial effusion s/p pericardial window 07/2014 who presented to [facility] 03/21/25 with worsening SOB. Pertinent admission labs include BNP > 4500.0, [place] tropononins, K 4.3, Cr 1.04  Was last seen in HF Clinic 02/15/15 for post hospital visit and c/o mild DOE. After admission in 01/2015 she was discharged on milrinone 0.125 for low output. Felt to be functionally better at this visit. She has difficulty following cardiology recommendations, such as only taking her Eliquis once per day despite continuous education on stroke risk.    She called HF beginning 02/25/15 with c/o numbness and tingling in her RUE (ipsilateral PICC placement). She was followed closely and continued to have complaints.  She was advised to move PICC sites or consider tunneled catheter, but pt opted to d/c milrinone infusion all together, to give her "arms time to heal" before having tunneled catheter placed. She called 03/14/15 to cancel appt for 12/20 and would not reschedule. She again called the office on 03/17/15 with SOB with class IIIb-IV symptoms. Was advised to go to ER for evaluation.  Pt refused and remained home, but called with continued complaints on 12/27. Initially refused go to ED or make appointment for HF clinic, but then showed up at hospital for evaluation.   Previous discharge weight 92 lbs.  She is up at least 10 lbs. Pt appears ill, breathing rapidly and leaning over to catch her breath. Is alternating pauses between sentences and every other word. She is very reluctant to restart milrinone via PICC with problems discussed above. Wants [place] to talk to her husband first. She is still having  paraesthesias in her RUE.   Review of Systems: [y] = yes, [ ]  = no   General: Weight gain [y]; Weight loss [ ] ; Anorexia [ ] ; Fatigue [y]; Fever [ ] ; Chills [y]; Weakness [y]  Cardiac: Chest pain/pressure [ ] ; Resting SOB [y]; Exertional SOB [y]; Orthopnea [ ] ; Pedal Edema [ ] ; Palpitations [ ] ; Syncope [ ] ; Presyncope [ ] ; Paroxysmal nocturnal dyspnea[ ]   Pulmonary: Cough [ ] ; Wheezing[ ] ; Hemoptysis[ ] ; Sputum [ ] ; Snoring [ ]   GI: Vomiting[ ] ; Dysphagia[ ] ; Melena[ ] ; Hematochezia [ ] ; Heartburn[ ] ; Abdominal pain [ ] ; Constipation [ ] ; Diarrhea [ ] ; BRBPR [ ]   GU: Hematuria[ ] ; Dysuria [ ] ; Nocturia[ ]   Vascular: Pain in legs with walking [ ] ; Pain in feet with lying flat [ ] ; Non-healing sores [ ] ; Stroke [ ] ; TIA [ ] ; Slurred speech [ ] ;  Neuro: Headaches[ ] ; Vertigo[ ] ; Seizures[ ] ; Paresthesias[y];Blurred vision [ ] ; Diplopia [ ] ; Vision changes [ ]   Ortho/Skin: Arthritis [y]; Joint pain [y]; Muscle pain [ ] ; Joint swelling [ ] ; Back Pain [ ] ; Rash [ ]   Psych: Depression[ ] ; Anxiety[ ]   Heme: Bleeding problems [ ] ; Clotting disorders [ ] ; Anemia [ ]   Endocrine: Diabetes [ ] ; Thyroid dysfunction[ ]   Home Medications Prior to Admission medications   Medication Sig Start Date End Date Taking? Authorizing Provider  amiodarone (PACERONE) 100 MG tablet Take 1 tablet (100 mg total) by mouth daily. 11/30/14  Yes Amy D Clegg, NP  apixaban (ELIQUIS) 5 MG TABS tablet Take 1  tablet (5 mg total) by mouth 2 (two) times daily. Patient taking differently: Take 5 mg by mouth daily.  07/30/14  Yes Brittainy Sherlynn Carbon, PA-C  digoxin 62.5 MCG TABS Take 0.0625 mg by mouth daily. 02/09/15  Yes Amy D Clegg, NP  milrinone (PRIMACOR) 20 MG/100ML SOLN infusion Inject 10.55 mcg/min into the vein continuous. Per  [facility] 02/09/15  Yes Amy Georgie Chard, NP  spironolactone (ALDACTONE) 25 MG tablet Take 0.5 tablets (12.5 mg total) by mouth daily. 02/09/15  Yes Amy D Clegg, NP  torsemide (DEMADEX) 20 MG tablet Take 20  mg by mouth every other day.   Yes Historical Provider, MD  potassium chloride SA (K-DUR,KLOR-CON) 20 MEQ tablet Take 2 tablets (40 mEq total) by mouth daily. Patient not taking: Reported on 02/15/2015 02/09/15   Sherald Hess, NP    Past Medical History: Past Medical History  Diagnosis Date  . Anemia   . Hypertension   . Blood transfusion without reported diagnosis   . Chronic systolic CHF (congestive heart failure) (HCC)     a. 06/2014 Echo: EF 20%.  Marland Kitchen NICM (nonischemic cardiomyopathy) (HCC)     a. 06/2014 Echo: EF 20%, diff HK, mild MR, mildly dil LA/RA, mildly reduced RV fxn;  b. 06/2014 Cath: nl cors.  . Pericardial effusion     a. 06/2014 Large effusion, no tamponade phys.    Past Surgical History: Past Surgical History  Procedure Laterality Date  . No past surgeries    . Left and right heart catheterization with coronary angiogram N/A 07/23/2014    Procedure: LEFT AND RIGHT HEART CATHETERIZATION WITH CORONARY ANGIOGRAM;  Surgeon: Laurey Morale, MD;  Location: [facility] CATH LAB;  Service: Cardiovascular;  Laterality: N/A;  . Subxyphoid pericardial window N/A 07/26/2014    Procedure: SUBXYPHOID PERICARDIAL WINDOW;  Surgeon: Delight Ovens, MD;  Location: [facility] OR;  Service: Thoracic;  Laterality: N/A;  . Tee without cardioversion N/A 07/26/2014    Procedure: TRANSESOPHAGEAL ECHOCARDIOGRAM (TEE);  Surgeon: Delight Ovens, MD;  Location: [facility] OR;  Service: Thoracic;  Laterality: N/A;  . Pleural effusion drainage N/A 07/26/2014    Procedure: DRAINAGE OF PERICARDIAL EFFUSION;  Surgeon: Delight Ovens, MD;  Location: [facility] OR;  Service: Thoracic;  Laterality: N/A;  . Cardiac catheterization N/A 02/07/2015    Procedure: Right Heart Cath;  Surgeon: Laurey Morale, MD;  Location: [facility] INVASIVE CV LAB;  Service: Cardiovascular;  Laterality: N/A;    Family History: Family History  Problem Relation Age of Onset  . Hypertension Sister   . Diabetes Maternal Grandmother   . Cancer Mother     died  young (pt was only in [employer] @ the time)  . Other Father     died in his 67's.    Social History: Social History   Social History  . Marital Status: Married    Spouse Name: N/A  . Number of Children: N/A  . Years of Education: N/A   Social History Main Topics  . Smoking status: Never Smoker   . Smokeless tobacco: Never Used  . Alcohol Use: No  . Drug Use: No  . Sexual Activity: No   Other Topics Concern  . None   Social History Narrative   Lives in [place] with husband.  Not currently working.    Allergies:  No Known Allergies  Objective:    Vital Signs:   Temp:  [97.4 F (36.3 C)-98.2 F (36.8 C)] 97.7 F (36.5 C) (12/28 0743) Pulse  Rate:  [58-100] 82 (12/28 0743) Resp:  [14-47] 19 (12/28 0743) BP: (111-145)/(91-119) 122/92 mmHg (12/28 0743) SpO2:  [86 %-100 %] 100 % (12/28 0743) Weight:  [102 lb 3.2 oz (46.358 kg)-102 lb 6.4 oz (46.448 kg)] 102 lb 6.4 oz (46.448 kg) (12/28 0614) Last BM Date: 03/22/15  Weight change: Filed Weights   03/23/15 0105 03/23/15 0614  Weight: 102 lb 3.2 oz (46.358 kg) 102 lb 6.4 oz (46.448 kg)    Intake/Output:   Intake/Output Summary (Last 24 hours) at 03/23/15 1012 Last data filed at 03/23/15 0845  Gross per 24 hour  Intake    420 ml  Output   1200 ml  Net   -780 ml     Physical Exam: General: Chronically ill, fatigued, and elderly appearing, tripod position on edge of bed.  HEENT: normal Neck: supple. JVP to jaw. Carotids 2+ bilat; no bruits. No thyromegaly or nodule noted. Cor: PMI nondisplaced. RRR. Paradoxical S2 split Lungs: Diminished breath sounds with bibasilar crackles.  Abdomen: soft, nontender, mildly distended. No HSM. No bruits or masses. +BS Extremities: no cyanosis, clubbing, rash.. Extremities, cool to the touch, 1+ ankle edema Neuro: alert & oriented x 3, cranial nerves grossly intact. moves all 4 extremities w/o difficulty. Affect pleasant.  Telemetry: NSR 90s, occasional PVCs  Labs: Basic  Metabolic Panel:  Recent Labs Lab 03/22/15 1717 03/23/15 0432  NA 142 139  K 4.3 3.7  CL 110 109  CO2 21* 20*  GLUCOSE 113* 115*  BUN 26* 27*  CREATININE 1.04* 1.11*  CALCIUM 9.3 9.0    Liver Function Tests: No results for input(s): AST, ALT, ALKPHOS, BILITOT, PROT, ALBUMIN in the last 168 hours. No results for input(s): LIPASE, AMYLASE in the last 168 hours. No results for input(s): AMMONIA in the last 168 hours.  CBC:  Recent Labs Lab 03/22/15 1717  WBC 8.2  HGB 12.4  HCT 39.8  MCV 88.2  PLT 199    Cardiac Enzymes:  Recent Labs Lab 03/23/15 0432  TROPONINI <0.03    BNP: BNP (last 3 results)  Recent Labs  11/11/14 1345 02/03/15 2310 03/22/15 2015  BNP 3454.9* 4003.4* >4500.0*    ProBNP (last 3 results) No results for input(s): PROBNP in the last 8760 hours.   CBG: No results for input(s): GLUCAP in the last 168 hours.  Coagulation Studies: No results for input(s): LABPROT, INR in the last 72 hours.  Other results: EKG: 03/22/15 NSR 92, inferior TWA  Imaging: Dg Chest 2 View  03/22/2015  CLINICAL DATA:  Chest tightness and history of hypertension. EXAM: CHEST  2 VIEW COMPARISON:  02/03/2015 FINDINGS: Midline trachea. Moderate cardiomegaly. Small right pleural effusion is similar. The left pleural effusion is resolved. No pneumothorax. No congestive failure. Worsened right base airspace disease. IMPRESSION: Similar small right pleural effusion with increased adjacent airspace disease. Favor atelectasis. Early infection cannot be excluded. Cardiomegaly without congestive failure. Resolved left pleural effusion. Electronically Signed   By: Jeronimo Greaves M.D.   On: 03/22/2015 17:35      Medications:     Current Medications: . amiodarone  100 mg Oral Daily  . apixaban  5 mg Oral BID  . digoxin  0.0625 mg Oral Daily  . furosemide  40 mg Oral Daily  . sodium chloride  3 mL Intravenous Q12H  . spironolactone  12.5 mg Oral Daily      Infusions:      Assessment/Plan   Katrina Peck is a 69 y.o. female with hx  of Chronic systolic HF due to NICM (EF 10-15% by Echo 02/04/15), PAF, hx of pericardial effusion s/p pericardial window 07/2014 who presented to [facility] 03/21/25 with worsening SOB. HF team consulted to assist in management.   1. Acute on chronic systolic EF, Echo 01/2015 EF 15-20% - Pt having Class IV symptoms.  Will give 80 mg IV lasix now - Pt is very reluctant to have PICC line placed, wants [place] to discuss with husband.  - Will start milrinone 0.25 mcg/kg/min through peripheral IV for now.  Will place order for PICC placement and talk with her husband.  - Will get stat coox once placed with CVP monitoring.  - Repeat echo pending 2. Acute respiratory failure - Breathing 25-30 times per minute on my arrival so BiPAP and ABG ordered.  Pt was able to calm down and RR slowed down to 15-18.  3. PAF - NSR by tele - Still taking eliquis once a day at home per notes.  Again discussed importance of BID dosing.   Length of Stay: 1  Graciella Freer PA-C 03/23/2015, 10:12 AM  Advanced Heart Failure Team Pager [phone] (M-F; 7a - 4p)  Please contact [facility] for night-coverage after hours (4p -7a ) and weekends on [email or web address]  Patient seen with PA, agree with the above note.  1. Acute on chronic systolic CHF: Low output CHF, was on home milrinone until she insisted that PICC be removed.  End stage heart failure, EF 10-15%.  Now admitted with weight gain and severe dyspnea.  She has diuresed with IV Lasix and has been restarted on milrinone, looks better.  - Probably only mild volume overload currently => give Lasix 80 mg IV bid and follow exam carefully, may be able to go to po tomorrow.  - Restarted milrinone gtt at 0.25 => will need to continue at home.  Long discussion with patient about need for home inotrope.  She agrees to placement of tunneled catheter (does not want another PICC).  Will arrange  for tomorrow.  Will follow CVP and co-ox after placement of line.  - Continue current digoxin and spironolactone, check digoxin level in am.  - Poor candidate for advanced therapies given poor compliance.  2. Atrial fibrillation: Remains in NSR on amiodarone.  Only taking Eliquis once daily despite education multiple times.  Will switch her to Xarelto for once daily dosing.   [name] 03/23/2015 3:35 PM  Echo showed LV thrombus.  Rather than Xarelto, will start heparin gtt overlapping with coumadin.   [name] 03/23/2015 6:01 PM

## 2015-03-23 NOTE — Care Management Note (Addendum)
Case Management Note  Patient Details  Name: Katrina Peck MRN: [id number] Date of Birth: 1946/03/05  Subjective/Objective:  Pt admitted for SOB- 69 y.o. female with hx of Chronic Systolic HF, PAF, Hx of pericardial effusion s/p pericardial window 07/2014 who presented to [facility] 03/21/25 with worsening SOB. Pertinent admission labs include BNP > 4500.0, Per MD notes:  Pt was last seen in HF Clinic 02/15/15 for post hospital visit and c/o mild DOE. After admission in 01/2015 she was discharged on milrinone 0.125 for low output. Felt to be functionally better at this visit. She has difficulty following cardiology recommendations. Pt was previously active with [facility] for [facility] RN [facility]. Pt will need resumption orders once stable for d/c. CM did contact [facility]- No further needs at this time.    Action/Plan: Plan will be to d/c home with [facility] Services once stable.    Expected Discharge Date:                  Expected Discharge Plan:  Home w Home Health Services  In-House Referral:  NA  Discharge planning Services  CM Consult  Post Acute Care Choice:  Home Health, Resumption of Svcs/PTA Provider Choice offered to:  Patient  DME Arranged:  N/A DME Agency:  NA  HH Arranged:  RN HH Agency:  [facility]  Status of Service:  Completed, signed off  Medicare Important Message Given:    Date Medicare IM Given:    Medicare IM give by:    Date Additional Medicare IM Given:    Additional Medicare Important Message give by:     If discussed at Long Length of Stay Meetings, dates discussed:    Additional Comments:1202 03-28-15 Tomi Bamberger, RN,BSN [phone] CM had discussion with MD in ref to disposition. Per pt she stated cardiology stated to be d/c on 03-28-14. Per Primary MD Rizwan Plan will be to d/c home today with [facility] via [facility].  Staff RN had several discussions with Cardiology, Pharmacy and Primary. No further needs from CM at this time.  Gala Lewandowsky,  RN 03/23/2015, 12:01 PM

## 2015-03-23 NOTE — Progress Notes (Signed)
[facility]  Patient Status:  Active HH pt with [facility] prior to this admission  [facility] is providing the following services:  HHRN. Pt was on home Milrinone until just prior to this readmission. [facility] hospital team will follow Mrs. Riling to support transition home when ordered.  If patient discharges after hours, please call [phone].   Sedalia Muta 03/23/2015, 7:13 AM

## 2015-03-23 NOTE — Progress Notes (Signed)
When starting pts milrinone we were discussing when PICC would be placed, pt stated should would not let anyone put a picc in either arms, but she would allow to be put in neck. I explained a PICC could not be placed in neck and if we did a central line in neck it could not be used at home. I explained she would need the milrinone once again at discharge and she stated I don't want to have to carry that pouch around again. Attempted to help pt understand need of milrinone, yet still does not understand. Notified Hassell Done PA of previous, states Dr. Shirlee Latch will be to see pt. Emelda Brothers RN

## 2015-03-23 NOTE — Progress Notes (Signed)
[facility] Progress Note   Katrina Peck  WUJ:[id number]  DOB: 1945-04-23  DOA: 03/22/2015 PCP: Maryelizabeth Rowan, MD  Brief narrative: Katrina Peck is a 69 y.o. female presenting to the hospital with dyspnea. H/o sCHF with EF of 10%, PAF on Eliquis. She states she has been short of breath for 1 month.    Subjective: Complaint of being short of braeth  Assessment/Plan: Active Problems:   CHF exacerbation (HCC) - per heart failure team  PAF - changed from Eliquis to Xarelto today as she refused to take Eliquis BID - on Amio and Dig per cardiology  Severe anxiety -  Patient wants to avoid Benzodiazepines and I agree- have discussed relaxation techniques    Code Status:     Code Status Orders        Start     Ordered   03/22/15 2249  Full code   Continuous     03/22/15 2251     Family Communication:   Disposition Plan: per heart failure team DVT prophylaxis: Xarelto Consultants: CHF team Procedures:  Antibiotics: Anti-infectives    None      Objective: Filed Weights   03/23/15 0105 03/23/15 0614  Weight: 46.358 kg (102 lb 3.2 oz) 46.448 kg (102 lb 6.4 oz)    Intake/Output Summary (Last 24 hours) at 03/23/15 1737 Last data filed at 03/23/15 1623  Gross per 24 hour  Intake    520 ml  Output   3550 ml  Net  -3030 ml     Vitals Filed Vitals:   03/23/15 0615 03/23/15 0743 03/23/15 1143 03/23/15 1632  BP:  122/92 133/94 115/85  Pulse:  82 87 74  Temp: 97.4 F (36.3 C) 97.7 F (36.5 C) 97.3 F (36.3 C) 97.6 F (36.4 C)  TempSrc: Oral Oral Oral Oral  Resp:  Height:      Weight:      SpO2: 99% 100% 100% 100%    Exam:  General:  Pt is alert, not in acute distress  HEENT: No icterus, No thrush, oral mucosa moist  Cardiovascular: regular rate and rhythm, S1/S2 No murmur  Respiratory: clear to auscultation bilaterally   Abdomen: Soft, +Bowel sounds, non tender, non distended, no guarding  MSK: No LE edema, cyanosis or  clubbing  Data Reviewed: Basic Metabolic Panel:  Recent Labs Lab 03/22/15 1717 03/23/15 0432  NA 142 139  K 4.3 3.7  CL 110 109  CO2 21* 20*  GLUCOSE 113* 115*  BUN 26* 27*  CREATININE 1.04* 1.11*  CALCIUM 9.3 9.0   Liver Function Tests: No results for input(s): AST, ALT, ALKPHOS, BILITOT, PROT, ALBUMIN in the last 168 hours. No results for input(s): LIPASE, AMYLASE in the last 168 hours. No results for input(s): AMMONIA in the last 168 hours. CBC:  Recent Labs Lab 03/22/15 1717  WBC 8.2  HGB 12.4  HCT 39.8  MCV 88.2  PLT 199   Cardiac Enzymes:  Recent Labs Lab 03/23/15 0432  TROPONINI <0.03   BNP (last 3 results)  Recent Labs  11/11/14 1345 02/03/15 2310 03/22/15 2015  BNP 3454.9* 4003.4* >4500.0*    ProBNP (last 3 results) No results for input(s): PROBNP in the last 8760 hours.  CBG: No results for input(s): GLUCAP in the last 168 hours.  Recent Results (from the past 240 hour(s))  MRSA PCR Screening     Status: None   Collection Time: 03/23/15 12:03 AM  Result Value Ref Range Status   MRSA  by PCR NEGATIVE NEGATIVE Final    Comment:        The GeneXpert MRSA Assay (FDA approved for NASAL specimens only), is one component of a comprehensive MRSA colonization surveillance program. It is not intended to diagnose MRSA infection nor to guide or monitor treatment for MRSA infections.      Studies: Dg Chest 2 View  03/22/2015  CLINICAL DATA:  Chest tightness and history of hypertension. EXAM: CHEST  2 VIEW COMPARISON:  02/03/2015 FINDINGS: Midline trachea. Moderate cardiomegaly. Small right pleural effusion is similar. The left pleural effusion is resolved. No pneumothorax. No congestive failure. Worsened right base airspace disease. IMPRESSION: Similar small right pleural effusion with increased adjacent airspace disease. Favor atelectasis. Early infection cannot be excluded. Cardiomegaly without congestive failure. Resolved left pleural  effusion. Electronically Signed   By: Jeronimo Greaves M.D.   On: 03/22/2015 17:35    Scheduled Meds:  Scheduled Meds: . amiodarone  100 mg Oral Daily  . digoxin  0.0625 mg Oral Daily  . furosemide  80 mg Intravenous BID  . rivaroxaban  15 mg Oral Q supper  . sodium chloride  3 mL Intravenous Q12H  . spironolactone  12.5 mg Oral Daily   Continuous Infusions: . milrinone 0.25 mcg/kg/min (03/23/15 1228)    Time spent on care of this patient: 35 min   Vanilla Heatherington, MD 03/23/2015, 5:37 PM  LOS: 1 day   [facility] Office  [phone] Pager - Text Page per [email or web address] If 7PM-7AM, please contact night-coverage [email or web address]

## 2015-03-23 NOTE — Progress Notes (Signed)
  Echocardiogram 2D Echocardiogram has been performed.  Arvil Chaco 03/23/2015, 5:05 PM

## 2015-03-23 NOTE — Progress Notes (Signed)
UR Completed Marjoria Mancillas Graves-Bigelow, RN,BSN [phone]  

## 2015-03-24 ENCOUNTER — Inpatient Hospital Stay (HOSPITAL_COMMUNITY): Payer: Medicare Other

## 2015-03-24 DIAGNOSIS — I213 ST elevation (STEMI) myocardial infarction of unspecified site: Secondary | ICD-10-CM

## 2015-03-24 DIAGNOSIS — I513 Intracardiac thrombosis, not elsewhere classified: Secondary | ICD-10-CM | POA: Insufficient documentation

## 2015-03-24 DIAGNOSIS — I5023 Acute on chronic systolic (congestive) heart failure: Secondary | ICD-10-CM

## 2015-03-24 LAB — BASIC METABOLIC PANEL
ANION GAP: 7 (ref 5–15)
BUN: 27 mg/dL — ABNORMAL HIGH (ref 6–20)
CALCIUM: 8.5 mg/dL — AB (ref 8.9–10.3)
CO2: 26 mmol/L (ref 22–32)
Chloride: 106 mmol/L (ref 101–111)
Creatinine, Ser: 1.02 mg/dL — ABNORMAL HIGH (ref 0.44–1.00)
GFR calc Af Amer: >60 mL/min (ref >60)
GFR calc non Af Amer: 55 mL/min — ABNORMAL LOW (ref >60)
GLUCOSE: 95 mg/dL (ref 65–99)
Potassium: 3.7 mmol/L (ref 3.5–5.1)
Sodium: 139 mmol/L (ref 135–145)

## 2015-03-24 LAB — DIGOXIN LEVEL: Digoxin Level: <0.2 ng/mL — ABNORMAL LOW (ref 0.8–2.0)

## 2015-03-24 LAB — CBC
HEMATOCRIT: 38.3 % (ref 36.0–46.0)
HEMOGLOBIN: 12.1 g/dL (ref 12.0–15.0)
MCH: 27.4 pg (ref 26.0–34.0)
MCHC: 31.6 g/dL (ref 30.0–36.0)
MCV: 86.8 fL (ref 78.0–100.0)
PLATELETS: 185 10*3/uL (ref 150–400)
RBC: 4.41 MIL/uL (ref 3.87–5.11)
RDW: 16.5 % — AB (ref 11.5–15.5)
WBC: 6.9 10*3/uL (ref 4.0–10.5)

## 2015-03-24 LAB — PROTIME-INR
INR: 1.42 (ref 0.00–1.49)
PROTHROMBIN TIME: 17.4 s — AB (ref 11.6–15.2)

## 2015-03-24 LAB — HEPARIN LEVEL (UNFRACTIONATED): Heparin Unfractionated: 0.97 IU/mL — ABNORMAL HIGH (ref 0.30–0.70)

## 2015-03-24 LAB — APTT: aPTT: 39 seconds — ABNORMAL HIGH (ref 24–37)

## 2015-03-24 MED ORDER — HEPARIN (PORCINE) IN NACL 100-0.45 UNIT/ML-% IJ SOLN
900.0000 [IU]/h | INTRAMUSCULAR | Status: DC
  Administered 2015-03-25: 950 [IU]/h via INTRAVENOUS
  Administered 2015-03-27 – 2015-03-28 (×2): 900 [IU]/h via INTRAVENOUS
  Filled 2015-03-24 (×3): qty 250

## 2015-03-24 MED ORDER — LIDOCAINE HCL 1 % IJ SOLN
INTRAMUSCULAR | Status: AC
  Filled 2015-03-24: qty 20

## 2015-03-24 MED ORDER — FENTANYL CITRATE (PF) 100 MCG/2ML IJ SOLN
INTRAMUSCULAR | Status: AC | PRN
  Administered 2015-03-24: 25 ug via INTRAVENOUS
  Administered 2015-03-24 (×2): 12.5 ug via INTRAVENOUS

## 2015-03-24 MED ORDER — CEFAZOLIN SODIUM-DEXTROSE 2-3 GM-% IV SOLR
2.0000 g | Freq: Once | INTRAVENOUS | Status: AC
  Administered 2015-03-24: 2 g via INTRAVENOUS

## 2015-03-24 MED ORDER — MIDAZOLAM HCL 2 MG/2ML IJ SOLN
INTRAMUSCULAR | Status: AC
  Filled 2015-03-24: qty 4

## 2015-03-24 MED ORDER — TORSEMIDE 20 MG PO TABS
20.0000 mg | ORAL_TABLET | Freq: Every day | ORAL | Status: DC
  Administered 2015-03-25 – 2015-03-28 (×4): 20 mg via ORAL
  Filled 2015-03-24 (×4): qty 1

## 2015-03-24 MED ORDER — POTASSIUM CHLORIDE CRYS ER 20 MEQ PO TBCR
20.0000 meq | EXTENDED_RELEASE_TABLET | Freq: Once | ORAL | Status: AC
  Administered 2015-03-24: 20 meq via ORAL
  Filled 2015-03-24: qty 1

## 2015-03-24 MED ORDER — SODIUM CHLORIDE 0.9 % IJ SOLN: 10.0000 mL | INTRAMUSCULAR | Status: DC | PRN

## 2015-03-24 MED ORDER — TORSEMIDE 20 MG PO TABS
20.0000 mg | ORAL_TABLET | ORAL | Status: DC
  Administered 2015-03-24: 20 mg via ORAL
  Filled 2015-03-24: qty 1

## 2015-03-24 MED ORDER — MIDAZOLAM HCL 2 MG/2ML IJ SOLN
INTRAMUSCULAR | Status: AC | PRN
  Administered 2015-03-24 (×2): 0.5 mg via INTRAVENOUS

## 2015-03-24 MED ORDER — FENTANYL CITRATE (PF) 100 MCG/2ML IJ SOLN
INTRAMUSCULAR | Status: AC
  Filled 2015-03-24: qty 4

## 2015-03-24 MED ORDER — CEFAZOLIN SODIUM-DEXTROSE 2-3 GM-% IV SOLR
INTRAVENOUS | Status: AC
  Filled 2015-03-24: qty 50

## 2015-03-24 MED ORDER — SODIUM CHLORIDE 0.9 % IJ SOLN
10.0000 mL | Freq: Two times a day (BID) | INTRAMUSCULAR | Status: DC
Start: 2015-03-24 — End: 2015-03-28
  Administered 2015-03-25: 20 mL
  Administered 2015-03-25 – 2015-03-28 (×7): 10 mL

## 2015-03-24 MED ORDER — WARFARIN SODIUM 5 MG PO TABS
5.0000 mg | ORAL_TABLET | Freq: Once | ORAL | Status: AC
  Administered 2015-03-24: 5 mg via ORAL
  Filled 2015-03-24: qty 1

## 2015-03-24 NOTE — Sedation Documentation (Signed)
Patient is resting comfortably. 

## 2015-03-24 NOTE — Progress Notes (Signed)
BIPAP is not indicated at this time. Patient is in no distress.

## 2015-03-24 NOTE — Plan of Care (Signed)
Problem: Cardiac: Goal: Ability to achieve and maintain adequate cardiopulmonary perfusion will improve Outcome: Progressing Weight and output decreasing with IV milrinone and po Demadex

## 2015-03-24 NOTE — Progress Notes (Signed)
Advanced Heart Failure Rounding Note  Primary Physician: Dr Maryelizabeth Rowan Primary Cardiologist: Dr. Shirlee Latch   Subjective:    Feels much better. Smiling this morning. Breathing much improved. Swelling down. Still anxious about PICC line placement and home milrinone, but willing.   Out > 4 L and down 6 lbs. Creatinine stable.   Objective:   Weight Range: 96 lb 11.2 oz (43.863 kg) Body mass index is 17.68 kg/(m^2).   Vital Signs:   Temp:  [97.3 F (36.3 C)-97.6 F (36.4 C)] 97.5 F (36.4 C) (12/29 0450) Pulse Rate:  [67-87] 67 (12/29 0450) Resp:  [16-25] 17 (12/29 0450) BP: (98-140)/(71-94) 115/75 mmHg (12/29 0450) SpO2:  [97 %-100 %] 97 % (12/29 0450) Weight:  [96 lb 11.2 oz (43.863 kg)] 96 lb 11.2 oz (43.863 kg) (12/29 0450) Last BM Date: 03/22/15  Weight change: Filed Weights   03/23/15 0105 03/23/15 0614 03/24/15 0450  Weight: 102 lb 3.2 oz (46.358 kg) 102 lb 6.4 oz (46.448 kg) 96 lb 11.2 oz (43.863 kg)    Intake/Output:   Intake/Output Summary (Last 24 hours) at 03/24/15 0920 Last data filed at 03/24/15 0745  Gross per 24 hour  Intake 487.31 ml  Output   4450 ml  Net -3962.69 ml     Physical Exam: General: Chronically ill and elderly appearing HEENT: normal Neck: supple. JVP 6-7. Carotids 2+ bilat; no bruits. No thyromegaly or lymphadenopathy noted. Cor: PMI nondisplaced. RRR. Paradoxical S2 split Lungs: Clear, normal effort Abdomen: soft, nontender, mildly distended. No HSM. No bruits or masses. +BS Extremities: no cyanosis, clubbing, rash.. Extremities warm, trace ankle edema Neuro: alert & oriented x 3, cranial nerves grossly intact. moves all 4 extremities w/o difficulty. Affect pleasant.  Telemetry: NSR 70s, occasional PVCs  Labs: CBC  Recent Labs  03/22/15 1717 03/24/15 0350  WBC 8.2 6.9  HGB 12.4 12.1  HCT 39.8 38.3  MCV 88.2 86.8  PLT 199 185   Basic Metabolic Panel  Recent Labs  03/23/15 0432 03/24/15 0350  NA 139 139  K 3.7  3.7  CL 109 106  CO2 20* 26  GLUCOSE 115* 95  BUN 27* 27*  CREATININE 1.11* 1.02*  CALCIUM 9.0 8.5*   Liver Function Tests No results for input(s): AST, ALT, ALKPHOS, BILITOT, PROT, ALBUMIN in the last 72 hours. No results for input(s): LIPASE, AMYLASE in the last 72 hours. Cardiac Enzymes  Recent Labs  03/23/15 0432  TROPONINI <0.03    BNP: BNP (last 3 results)  Recent Labs  11/11/14 1345 02/03/15 2310 03/22/15 2015  BNP 3454.9* 4003.4* >4500.0*    ProBNP (last 3 results) No results for input(s): PROBNP in the last 8760 hours.   D-Dimer No results for input(s): DDIMER in the last 72 hours. Hemoglobin A1C No results for input(s): HGBA1C in the last 72 hours. Fasting Lipid Panel No results for input(s): CHOL, HDL, LDLCALC, TRIG, CHOLHDL, LDLDIRECT in the last 72 hours. Thyroid Function Tests No results for input(s): TSH, T4TOTAL, T3FREE, THYROIDAB in the last 72 hours.  Invalid input(s): FREET3  Other results:     Imaging/Studies:  Dg Chest 2 View  03/22/2015  CLINICAL DATA:  Chest tightness and history of hypertension. EXAM: CHEST  2 VIEW COMPARISON:  02/03/2015 FINDINGS: Midline trachea. Moderate cardiomegaly. Small right pleural effusion is similar. The left pleural effusion is resolved. No pneumothorax. No congestive failure. Worsened right base airspace disease. IMPRESSION: Similar small right pleural effusion with increased adjacent airspace disease. Favor atelectasis. Early infection cannot be excluded.  Cardiomegaly without congestive failure. Resolved left pleural effusion. Electronically Signed   By: Jeronimo Greaves M.D.   On: 03/22/2015 17:35     Latest Echo  Latest Cath   Medications:     Scheduled Medications: . amiodarone  100 mg Oral Daily  . digoxin  0.0625 mg Oral Daily  . furosemide  80 mg Intravenous BID  . sodium chloride  3 mL Intravenous Q12H  . spironolactone  12.5 mg Oral Daily  . warfarin  5 mg Oral ONCE-1800  . warfarin    Does not apply Once  . Warfarin - Pharmacist Dosing Inpatient   Does not apply q1800     Infusions: . heparin 700 Units/hr (03/24/15 3664)  . milrinone 0.25 mcg/kg/min (03/23/15 1228)     PRN Medications:  sodium chloride, acetaminophen, ondansetron (ZOFRAN) IV, sodium chloride   Assessment/Plan   Katrina Peck is a 69 y.o. female with hx of Chronic systolic HF due to NICM (EF 10-15% by Echo 02/04/15), PAF, hx of pericardial effusion s/p pericardial window 07/2014 who presented to [facility] 03/21/25 with worsening SOB. HF team consulted to assist in management.   1. Acute on chronic systolic CHF: Low output CHF, was on home milrinone until she insisted that PICC be removed. End stage heart failure, EF 10-15%.   - Improved with IV lasix and resumption of milrinone. Restart home torsemide today, will better assess volume status with CVP later today.  - Continue milrinone gtt at 0.25 => will need to resume at home.  - To have tunneled catheter placed this am. CVP and co-ox to follow. Supp K this evening after procedure. - Continue current digoxin and spironolactone, - Dig level <0.2 - Non-compliance with recommendations make her a poor candidate for advanced therapies.  2. Atrial fibrillation:  - Remains in NSR on amiodarone. - Had only been taking Eliquis once a day despite frequent education. Planned to switch to Xarelto for daily dosing, but with discovery of #3, will need coumadin. Pharmacy to dose.  3. LV thrombus - Newly noted on Echo 03/23/15. Had only been taking Eliquis once daily despite multiple re-counseling.  - Started on coumadin with pharmacy dosing and heparin bridging.   Length of Stay: 2  Katrina Freer PA-C 03/24/2015, 9:20 AM  Advanced Heart Failure Team Pager [phone] (M-F; 7a - 4p)  Please contact [facility] for night-coverage after hours (4p -7a ) and weekends on [email or web address]  Patient seen with PA, agree with the above note.  Good diuresis,  volume status improved.  Will get tunneled catheter today for home milrinone.  On IV heparin gtt for bridging to therapeutic coumadin with LV thrombus.  We have tried to give her extensive education about why she is on all her medications.   Katrina Peck 03/24/2015

## 2015-03-24 NOTE — Procedures (Signed)
Placement of right jugular tunneled central venous catheter.  Tip at SVC/ RA and ready to use.  No immediate complication.

## 2015-03-24 NOTE — Plan of Care (Signed)
Problem: Cardiac: Goal: Ability to achieve and maintain adequate cardiopulmonary perfusion will improve Outcome: Progressing Echo completed this admission.

## 2015-03-24 NOTE — Progress Notes (Signed)
ANTICOAGULATION CONSULT NOTE - Follow-up Consult  Pharmacy Consult for heparin, coumadin Indication: LV thrombus  No Known Allergies  Patient Measurements: Height: 5\' 2"  (157.5 cm) Weight: 96 lb 11.2 oz (43.863 kg) IBW/kg (Calculated) : 50.1  Vital Signs: Temp: 97.5 F (36.4 C) (12/29 0450) Temp Source: Oral (12/29 0450) BP: 115/75 mmHg (12/29 0450) Pulse Rate: 67 (12/29 0450)  Labs:  Recent Labs  03/22/15 1717 03/23/15 0432 03/23/15 1922 03/24/15 0350  HGB 12.4  --   --  12.1  HCT 39.8  --   --  38.3  PLT 199  --   --  185  APTT  --   --   --  39*  LABPROT  --   --  17.6* 17.4*  INR  --   --  1.44 1.42  HEPARINUNFRC  --   --   --  0.97*  CREATININE 1.04* 1.11*  --  1.02*  TROPONINI  --  <0.03  --   --     Estimated Creatinine Clearance: 36.6 mL/min (by C-G formula based on Cr of 1.02).  Assessment: 69 yo female with afib on apixiban PTA and now noted with LV thrombus (unable to use NOACs). Pharmacy has been consulted to dose heparin/coumadin. Last dose of apixiban unknown but heparin level elevated to 0.97 with aPTT 39 so apixaban still affecting heparin level. aPTT subtherapeutic on 550 units/hr. INR with no movement past first dose of coumadin yesterday. CBC stable. No issues with line or bleeding reported per RN.  Will use aPTT to monitor heparin therapy until correlating with heparin level. Also noted that pt may be transitioned to Lovenox after procedure.  Goal of Therapy:  Heparin level 0.3-0.7 units/ml; aPTT 66-102 sec Monitor platelets by anticoagulation protocol: Yes   Plan:   -Coumadin 5mg  po again today -Daily PT/INR, aPTT, CBC, and heparin level -Increase heparin to 700 units/hr -APTT in 8 hours  Christoper Fabian, PharmD, BCPS Clinical pharmacist, pager [phone]  03/24/2015 6:07 AM

## 2015-03-24 NOTE — Progress Notes (Signed)
[facility] Progress Note   Katrina Peck  YHC:[id number]  DOB: 1945-12-25  DOA: 03/22/2015 PCP: Katrina Rowan, MD  Brief narrative: Katrina Peck is a 69 y.o. female presenting to the hospital with dyspnea. H/o sCHF with EF of 10%, PAF on Eliquis. She states she has been short of breath for 1 month.    Subjective: No complaints today.   Assessment/Plan: Active Problems:  systolic CHF with exacerbation - per heart failure team  PAF - changed from Eliquis to Xarelto as she refused to take Eliquis BID- now on Heparin due to cardiac thrombus - on Amio and Dig per cardiology  Cardiac thrombus - large mural apical thrombus - found on ECHO yesterday- now on Heparin per cardiology   Severe anxiety -  Patient wants to avoid Benzodiazepines and I agree- have discussed relaxation techniques    Code Status:     Code Status Orders        Start     Ordered   03/22/15 2249  Full code   Continuous     03/22/15 2251     Family Communication:   Disposition Plan: per heart failure team DVT prophylaxis: Heparin Consultants: CHF team Procedures:  Antibiotics: Anti-infectives    None      Objective: Filed Weights   03/23/15 0105 03/23/15 0614 03/24/15 0450  Weight: 46.358 kg (102 lb 3.2 oz) 46.448 kg (102 lb 6.4 oz) 43.863 kg (96 lb 11.2 oz)    Intake/Output Summary (Last 24 hours) at 03/24/15 1227 Last data filed at 03/24/15 1130  Gross per 24 hour  Intake 487.31 ml  Output   4450 ml  Net -3962.69 ml     Vitals Filed Vitals:   03/24/15 0015 03/24/15 0019 03/24/15 0450 03/24/15 0845  BP:   115/75 113/85  Pulse:   67 83  Temp:  97.6 F (36.4 C) 97.5 F (36.4 C)   TempSrc:  Oral Oral   Resp:   17 18  Height:      Weight:   43.863 kg (96 lb 11.2 oz)   SpO2: 99%  97% 100%    Exam:  General:  Pt is alert, not in acute distress  HEENT: No icterus, No thrush, oral mucosa moist  Cardiovascular: regular rate and rhythm, S1/S2 No  murmur  Respiratory: clear to auscultation bilaterally   Abdomen: Soft, +Bowel sounds, non tender, non distended, no guarding  MSK: No LE edema, cyanosis or clubbing  Data Reviewed: Basic Metabolic Panel:  Recent Labs Lab 03/22/15 1717 03/23/15 0432 03/24/15 0350  NA 142 139 139  K 4.3 3.7 3.7  CL 110 109 106  CO2 21* 20* 26  GLUCOSE 113* 115* 95  BUN 26* 27* 27*  CREATININE 1.04* 1.11* 1.02*  CALCIUM 9.3 9.0 8.5*   Liver Function Tests: No results for input(s): AST, ALT, ALKPHOS, BILITOT, PROT, ALBUMIN in the last 168 hours. No results for input(s): LIPASE, AMYLASE in the last 168 hours. No results for input(s): AMMONIA in the last 168 hours. CBC:  Recent Labs Lab 03/22/15 1717 03/24/15 0350  WBC 8.2 6.9  HGB 12.4 12.1  HCT 39.8 38.3  MCV 88.2 86.8  PLT 199 185   Cardiac Enzymes:  Recent Labs Lab 03/23/15 0432  TROPONINI <0.03   BNP (last 3 results)  Recent Labs  11/11/14 1345 02/03/15 2310 03/22/15 2015  BNP 3454.9* 4003.4* >4500.0*    ProBNP (last 3 results) No results for input(s): PROBNP in the last 8760 hours.  CBG: No  results for input(s): GLUCAP in the last 168 hours.  Recent Results (from the past 240 hour(s))  MRSA PCR Screening     Status: None   Collection Time: 03/23/15 12:03 AM  Result Value Ref Range Status   MRSA by PCR NEGATIVE NEGATIVE Final    Comment:        The GeneXpert MRSA Assay (FDA approved for NASAL specimens only), is one component of a comprehensive MRSA colonization surveillance program. It is not intended to diagnose MRSA infection nor to guide or monitor treatment for MRSA infections.      Studies: Dg Chest 2 View  03/22/2015  CLINICAL DATA:  Chest tightness and history of hypertension. EXAM: CHEST  2 VIEW COMPARISON:  02/03/2015 FINDINGS: Midline trachea. Moderate cardiomegaly. Small right pleural effusion is similar. The left pleural effusion is resolved. No pneumothorax. No congestive failure.  Worsened right base airspace disease. IMPRESSION: Similar small right pleural effusion with increased adjacent airspace disease. Favor atelectasis. Early infection cannot be excluded. Cardiomegaly without congestive failure. Resolved left pleural effusion. Electronically Signed   By: Jeronimo Greaves M.D.   On: 03/22/2015 17:35    Scheduled Meds:  Scheduled Meds: . amiodarone  100 mg Oral Daily  . digoxin  0.0625 mg Oral Daily  . potassium chloride  20 mEq Oral Once  . sodium chloride  3 mL Intravenous Q12H  . spironolactone  12.5 mg Oral Daily  . torsemide  20 mg Oral QODAY  . warfarin  5 mg Oral ONCE-1800  . warfarin   Does not apply Once  . Warfarin - Pharmacist Dosing Inpatient   Does not apply q1800   Continuous Infusions: . heparin 700 Units/hr (03/24/15 6962)  . milrinone 0.25 mcg/kg/min (03/23/15 1228)    Time spent on care of this patient: 35 min   [name], MD 03/24/2015, 12:27 PM  LOS: 2 days   [facility] Office  [phone] Pager - Text Page per [email or web address] If 7PM-7AM, please contact night-coverage [email or web address]

## 2015-03-24 NOTE — Plan of Care (Signed)
Problem: Activity: Goal: Capacity to carry out activities will improve Outcome: Progressing Pt able to ambulate small distances today without sob

## 2015-03-24 NOTE — Plan of Care (Signed)
Problem: Activity: Goal: Capacity to carry out activities will improve Outcome: Progressing Patient walking in room to bedside commode (with staff assistance x1), per patient breathing feels better.  Patient appears to be less short of breath with activity this evening than the previous night when this RN was assigned to patient.

## 2015-03-24 NOTE — Progress Notes (Signed)
RN offered to set up Coumadin video for patient to watch.  Patient stated she was too sleepy at this time and would like to watch it later.  RN moved scheduled time of video to 1000.

## 2015-03-24 NOTE — Sedation Documentation (Signed)
Patient denies pain and is resting comfortably.  

## 2015-03-24 NOTE — Progress Notes (Signed)
ANTICOAGULATION CONSULT NOTE - Follow-up Consult  Pharmacy Consult for heparin Indication: LV thrombus  No Known Allergies  Patient Measurements: Height: 5\' 2"  (157.5 cm) Weight: 96 lb 11.2 oz (43.863 kg) IBW/kg (Calculated) : 50.1  Vital Signs: BP: 114/81 mmHg (12/29 1752) Pulse Rate: 78 (12/29 1752)  Labs:  Recent Labs  03/22/15 1717 03/23/15 0432 03/23/15 1922 03/24/15 0350  HGB 12.4  --   --  12.1  HCT 39.8  --   --  38.3  PLT 199  --   --  185  APTT  --   --   --  39*  LABPROT  --   --  17.6* 17.4*  INR  --   --  1.44 1.42  HEPARINUNFRC  --   --   --  0.97*  CREATININE 1.04* 1.11*  --  1.02*  TROPONINI  --  <0.03  --   --     Estimated Creatinine Clearance: 36.6 mL/min (by C-G formula based on Cr of 1.02).  Assessment: 69 yo female with afib on apixiban PTA and now noted with LV thrombus (unable to use NOACs). Pharmacy has been consulted to dose heparin/coumadin. Last dose of apixiban unknown bu tapixaban still affecting heparin level.   She is now s/p tunneled catheter and ok to resume heparin per Dr. Butler Denmark.  Goal of Therapy:  Heparin level 0.3-0.7 units/ml; aPTT 66-102 sec Monitor platelets by anticoagulation protocol: Yes   Plan:   -Resume heparin at 700 units/hr  -aPTT in 8 hrs  -Daily aPTT, CBC, and heparin level  Harland German, Pharm D 03/24/2015 6:22 PM

## 2015-03-24 NOTE — Sedation Documentation (Signed)
Patient denies pain and is resting comfortably. Pt restless, additional medication given

## 2015-03-25 LAB — CBC
HEMATOCRIT: 40.2 % (ref 36.0–46.0)
HEMOGLOBIN: 12.9 g/dL (ref 12.0–15.0)
MCH: 27.9 pg (ref 26.0–34.0)
MCHC: 32.1 g/dL (ref 30.0–36.0)
MCV: 86.8 fL (ref 78.0–100.0)
Platelets: 212 10*3/uL (ref 150–400)
RBC: 4.63 MIL/uL (ref 3.87–5.11)
RDW: 16.4 % — AB (ref 11.5–15.5)
WBC: 7.2 10*3/uL (ref 4.0–10.5)

## 2015-03-25 LAB — BASIC METABOLIC PANEL
ANION GAP: 8 (ref 5–15)
BUN: 24 mg/dL — ABNORMAL HIGH (ref 6–20)
CALCIUM: 8.6 mg/dL — AB (ref 8.9–10.3)
CO2: 28 mmol/L (ref 22–32)
Chloride: 100 mmol/L — ABNORMAL LOW (ref 101–111)
Creatinine, Ser: 1.28 mg/dL — ABNORMAL HIGH (ref 0.44–1.00)
GFR, EST AFRICAN AMERICAN: 49 mL/min — AB (ref >60)
GFR, EST NON AFRICAN AMERICAN: 42 mL/min — AB (ref >60)
GLUCOSE: 109 mg/dL — AB (ref 65–99)
POTASSIUM: 4.7 mmol/L (ref 3.5–5.1)
SODIUM: 136 mmol/L (ref 135–145)

## 2015-03-25 LAB — PROTIME-INR
INR: 1.32 (ref 0.00–1.49)
Prothrombin Time: 16.5 seconds — ABNORMAL HIGH (ref 11.6–15.2)

## 2015-03-25 LAB — CARBOXYHEMOGLOBIN
CARBOXYHEMOGLOBIN: 1.6 % — AB (ref 0.5–1.5)
METHEMOGLOBIN: 0.7 % (ref 0.0–1.5)
O2 SAT: 76.3 %
TOTAL HEMOGLOBIN: 13.2 g/dL (ref 12.0–16.0)

## 2015-03-25 LAB — APTT
APTT: 49 s — AB (ref 24–37)
aPTT: 40 seconds — ABNORMAL HIGH (ref 24–37)

## 2015-03-25 LAB — HEPARIN LEVEL (UNFRACTIONATED): Heparin Unfractionated: 0.54 IU/mL (ref 0.30–0.70)

## 2015-03-25 MED ORDER — WARFARIN SODIUM 7.5 MG PO TABS
7.5000 mg | ORAL_TABLET | Freq: Once | ORAL | Status: AC
  Administered 2015-03-25: 7.5 mg via ORAL
  Filled 2015-03-25: qty 1

## 2015-03-25 NOTE — Progress Notes (Signed)
Advanced Heart Failure Rounding Note  Primary Physician: Dr Maryelizabeth Rowan Primary Cardiologist: Dr. Shirlee Latch   Subjective:    Tunneled PICC placed last night.  Coox 76.3 this am on 0.25 milrinone. Understand she will need to be here for a few days for INR monitoring to therapeutic level.  Denies SOB. She is sore from tunneled cath placement. Was having "difficulty swallowing" at first, but improved. Ate all of her breakfast without difficulty.  Weight stable on po diuretics. Creatinine bump slightly. 1.0 -> 1.2.  Objective:   Weight Range: 96 lb 4.8 oz (43.681 kg) Body mass index is 17.61 kg/(m^2).   Vital Signs:   Temp:  [97.9 F (36.6 C)-98.2 F (36.8 C)] 97.9 F (36.6 C) (12/30 0352) Pulse Rate:  [75-85] 82 (12/29 2025) Resp:  [14-21] 16 (12/30 0355) BP: (96-122)/(64-85) 96/82 mmHg (12/30 0355) SpO2:  [96 %-100 %] 96 % (12/30 0430) Weight:  [96 lb 4.8 oz (43.681 kg)] 96 lb 4.8 oz (43.681 kg) (12/30 0352) Last BM Date: 03/23/15  Weight change: Filed Weights   03/23/15 0614 03/24/15 0450 03/25/15 0352  Weight: 102 lb 6.4 oz (46.448 kg) 96 lb 11.2 oz (43.863 kg) 96 lb 4.8 oz (43.681 kg)    Intake/Output:   Intake/Output Summary (Last 24 hours) at 03/25/15 0904 Last data filed at 03/25/15 0700  Gross per 24 hour  Intake 674.92 ml  Output   2175 ml  Net -1500.08 ml     Physical Exam: General: Chronically ill and elderly appearing HEENT: normal Neck: supple. JVP 5-6. Carotids 2+ bilat; no bruits. No thyromegaly or nodule noted. Cor: PMI nondisplaced. RRR. Paradoxical S2 split. Tunneled IJ R chest. Lungs: CTAB Abdomen: soft, NT, ND, no HSM. No bruits or masses. Bowel sounds present. Extremities: no cyanosis, clubbing, rash. Trace ankle edema. Neuro: alert & oriented x 3, cranial nerves grossly intact. moves all 4 extremities w/o difficulty. Affect pleasant.  Telemetry: Reviewed personally, NSR 80s, occasional PVCs  Labs: CBC  Recent Labs  03/24/15 0350  03/25/15 0410  WBC 6.9 7.2  HGB 12.1 12.9  HCT 38.3 40.2  MCV 86.8 86.8  PLT 185 212   Basic Metabolic Panel  Recent Labs  03/24/15 0350 03/25/15 0410  NA 139 136  K 3.7 4.7  CL 106 100*  CO2 26 28  GLUCOSE 95 109*  BUN 27* 24*  CREATININE 1.02* 1.28*  CALCIUM 8.5* 8.6*   Liver Function Tests No results for input(s): AST, ALT, ALKPHOS, BILITOT, PROT, ALBUMIN in the last 72 hours. No results for input(s): LIPASE, AMYLASE in the last 72 hours. Cardiac Enzymes  Recent Labs  03/23/15 0432  TROPONINI <0.03    BNP: BNP (last 3 results)  Recent Labs  11/11/14 1345 02/03/15 2310 03/22/15 2015  BNP 3454.9* 4003.4* >4500.0*    ProBNP (last 3 results) No results for input(s): PROBNP in the last 8760 hours.   D-Dimer No results for input(s): DDIMER in the last 72 hours. Hemoglobin A1C No results for input(s): HGBA1C in the last 72 hours. Fasting Lipid Panel No results for input(s): CHOL, HDL, LDLCALC, TRIG, CHOLHDL, LDLDIRECT in the last 72 hours. Thyroid Function Tests No results for input(s): TSH, T4TOTAL, T3FREE, THYROIDAB in the last 72 hours.  Invalid input(s): FREET3  Other results:   Imaging/Studies:  Ir Fluoro Guide Cv Line Right  03/24/2015  INDICATION: Acute on chronic systolic CHF. Tunneled central venous catheter needed for cardiac medicine. EXAM: FLUOROSCOPIC AND ULTRASOUND GUIDED PLACEMENT OF A TUNNELED CENTRAL VENOUS CATHETER Physician:  Katrina R. Henn, MD FLUOROSCOPY TIME:  54 seconds, 3 mGy MEDICATIONS: Ancef 2 g. As antibiotic prophylaxis, Ancef was ordered pre-procedure and administered intravenously within one hour of incision. 1 mg Versed, 50 mcg fentanyl. A radiology nurse monitored the patient for moderate sedation. ANESTHESIA/SEDATION: Moderate sedation time: 20 minutes PROCEDURE: Informed consent was obtained for placement of a tunneled central venous catheter. The patient was placed supine on the interventional table. Ultrasound confirmed  a patent right internal jugularvein. Ultrasound images were obtained for documentation. The right side of the neck was prepped and draped in a sterile fashion. The right side of the neck was anesthetized with 1% lidocaine. Maximal barrier sterile technique was utilized including caps, mask, sterile gowns, sterile gloves, sterile drape, hand hygiene and skin antiseptic. A small incision was made with #11 blade scalpel. A 21 gauge needle directed into the right internal jugular vein with ultrasound guidance. A micropuncture dilator set was placed. A dual lumen Powerline catheter was selected. The skin below the right clavicle was anesthetized and a small incision was made with an #11 blade scalpel. A subcutaneous tunnel was formed to the vein dermatotomy site. The catheter was brought through the tunnel. Catheter was cut to 24 cm. The vein dermatotomy site was dilated to accommodate a peel-away sheath. The catheter was placed through the peel-away sheath and directed into the central venous structures. The tip of the catheter was placed at the superior cavoatrial junction with fluoroscopy. Fluoroscopic images were obtained for documentation. Both lumens were found to aspirate and flush well. The vein dermatotomy site was closed with Dermabond. Gel-Foam was placed in the subcutaneous tract. The catheter was secured to the skin using Prolene suture. FINDINGS: Catheter tip at the superior cavoatrial junction. Estimated blood loss: Minimal COMPLICATIONS: None IMPRESSION: Successful placement of a right jugular tunneled central venous catheter using ultrasound and fluoroscopic guidance. Electronically Signed   By: Katrina Peck M.D.   On: 03/24/2015 18:39   Ir US Guide Vasc Access Right  03/24/2015  INDICATION: Acute on chronic systolic CHF. Tunneled central venous catheter needed for cardiac medicine. EXAM: FLUOROSCOPIC AND ULTRASOUND GUIDED PLACEMENT OF A TUNNELED CENTRAL VENOUS CATHETER Physician: Katrina Peck. Henn, MD  FLUOROSCOPY TIME:  54 seconds, 3 mGy MEDICATIONS: Ancef 2 g. As antibiotic prophylaxis, Ancef was ordered pre-procedure and administered intravenously within one hour of incision. 1 mg Versed, 50 mcg fentanyl. A radiology nurse monitored the patient for moderate sedation. ANESTHESIA/SEDATION: Moderate sedation time: 20 minutes PROCEDURE: Informed consent was obtained for placement of a tunneled central venous catheter. The patient was placed supine on the interventional table. Ultrasound confirmed a patent right internal jugularvein. Ultrasound images were obtained for documentation. The right side of the neck was prepped and draped in a sterile fashion. The right side of the neck was anesthetized with 1% lidocaine. Maximal barrier sterile technique was utilized including caps, mask, sterile gowns, sterile gloves, sterile drape, hand hygiene and skin antiseptic. A small incision was made with #11 blade scalpel. A 21 gauge needle directed into the right internal jugular vein with ultrasound guidance. A micropuncture dilator set was placed. A dual lumen Powerline catheter was selected. The skin below the right clavicle was anesthetized and a small incision was made with an #11 blade scalpel. A subcutaneous tunnel was formed to the vein dermatotomy site. The catheter was brought through the tunnel. Catheter was cut to 24 cm. The vein dermatotomy site was dilated to accommodate a peel-away sheath. The catheter was placed through the peel-away  sheath and directed into the central venous structures. The tip of the catheter was placed at the superior cavoatrial junction with fluoroscopy. Fluoroscopic images were obtained for documentation. Both lumens were found to aspirate and flush well. The vein dermatotomy site was closed with Dermabond. Gel-Foam was placed in the subcutaneous tract. The catheter was secured to the skin using Prolene suture. FINDINGS: Catheter tip at the superior cavoatrial junction. Estimated blood  loss: Minimal COMPLICATIONS: None IMPRESSION: Successful placement of a right jugular tunneled central venous catheter using ultrasound and fluoroscopic guidance. Electronically Signed   By: Katrina Peck M.D.   On: 03/24/2015 18:39    Latest Echo  Latest Cath   Medications:     Scheduled Medications: . amiodarone  100 mg Oral Daily  . digoxin  0.0625 mg Oral Daily  . sodium chloride  10-40 mL Intracatheter Q12H  . sodium chloride  3 mL Intravenous Q12H  . spironolactone  12.5 mg Oral Daily  . torsemide  20 mg Oral Daily  . warfarin  7.5 mg Oral ONCE-1800  . warfarin   Does not apply Once  . Warfarin - Pharmacist Dosing Inpatient   Does not apply q1800    Infusions: . heparin 850 Units/hr (03/25/15 0500)  . milrinone 0.25 mcg/kg/min (03/23/15 1228)    PRN Medications: sodium chloride, acetaminophen, ondansetron (ZOFRAN) IV, sodium chloride, sodium chloride   Assessment/Plan   Katrina Peck is a 69 y.o. female with hx of Chronic systolic HF due to NICM (EF 10-15% by Echo 02/04/15), PAF, hx of pericardial effusion s/p pericardial window 07/2014 who presented to [facility] 03/21/25 with worsening SOB. HF team consulted to assist in management.   1. Acute on chronic systolic CHF: Low output CHF, was on home milrinone until she insisted that PICC be removed. End stage heart failure, EF 10-15%.   - Volume status stable on po torsemide. CVP 4-5, checked personally.   - s/p tunneled cath placement.  Coox 76% on 0.25 milrinone. Continue, will need home milrinone.  - Continue current digoxin and spironolactone. - Dig level <0.2 - Poor candidate for advanced therapies with non-compliance with recommendations. Will have to monitor INR closely.  2. Atrial fibrillation:  - Remains in NSR on amiodarone. - Continue coumadin with heparin bridge. Pharmacy dosing.  3. LV thrombus - Found on Echo 03/23/15. Had only been taking Eliquis once daily despite multiple re-counseling.  - Will need to  stay in house until INR therapeutic. INR 1.32 this morning.   Length of Stay: 3  Katrina Freer PA-C 03/25/2015, 9:04 AM  Advanced Heart Failure Team Pager [phone] (M-F; 7a - 4p)  Please contact [facility] for night-coverage after hours (4p -7a ) and weekends on [email or web address]  Patient seen with PA, agree with the above note.  Now has tunneled catheter in place, CVP 4-5.  Back on po diuretics.  Continue current milrinone, good co-ox.  Overall, feeling better and less dyspneic. Has LV apical thrombus, now on heparin bridge to therapeutic coumadin.  We discussed potentially going home on Lovenox.  She wants to discuss with her husband to see if he would be willing to give her the injections.  She will let us know tomorrow what she decides.  If she does not think that she will be able to manage the Lovenox injections, she will stay in hospital until INR > 2.   Katrina Peck 03/25/2015 4:15 PM

## 2015-03-25 NOTE — Care Management Important Message (Signed)
Important Message  Patient Details  Name: Katrina Peck MRN: [id number] Date of Birth: 1945/06/08   Medicare Important Message Given:  Yes    Kyla Balzarine 03/25/2015, 3:24 PM

## 2015-03-25 NOTE — Clinical Documentation Improvement (Signed)
Internal Medicine  Noted patient's BMI 17.61.  Please provide a clinical diagnosis for condition if appropriate for this admission.  Thank you   Document Severity - Severe(third degree), Moderate (second degree), Mild (first degree) (Malnutrition)  Underweight  Cachectic  Other condition  Unable to clinically determine      Please exercise your independent, professional judgment when responding. A specific answer is not anticipated or expected.   Thank You, Lavonda Jumbo [facility] Information Management [place] [phone]

## 2015-03-25 NOTE — Progress Notes (Signed)
ANTICOAGULATION CONSULT NOTE - Follow-up Consult  Pharmacy Consult for heparin Indication: LV thrombus  No Known Allergies  Patient Measurements: Height: 5\' 2"  (157.5 cm) Weight: 96 lb 4.8 oz (43.681 kg) IBW/kg (Calculated) : 50.1  Vital Signs: Temp: 97.9 F (36.6 C) (12/30 0352) Temp Source: Oral (12/30 0352) BP: 98/64 mmHg (12/30 0000) Pulse Rate: 82 (12/29 2025)  Labs:  Recent Labs  03/22/15 1717 03/23/15 0432 03/23/15 1922 03/24/15 0350 03/25/15 0410  HGB 12.4  --   --  12.1 12.9  HCT 39.8  --   --  38.3 40.2  PLT 199  --   --  185 212  APTT  --   --   --  39* 40*  LABPROT  --   --  17.6* 17.4* 16.5*  INR  --   --  1.44 1.42 1.32  HEPARINUNFRC  --   --   --  0.97* 0.54  CREATININE 1.04* 1.11*  --  1.02* 1.28*  TROPONINI  --  <0.03  --   --   --     Estimated Creatinine Clearance: 29 mL/min (by C-G formula based on Cr of 1.28).  Assessment: 69 yo female with afib on apixiban PTA and now noted with LV thrombus (unable to use NOACs). Pt on heparin bridge to coumadin. Last dose of apixiban unknown but apixaban still affecting heparin level. Using aPTT to monitor heparin until aPTT and heparin level correlating. aPTT 40 sec on 700 units/hr. No issues with line or bleeding reported per RN.  INR down to 1.32 - no movement past 2 doses of 5mg  coumadin.   Goal of Therapy:  Heparin level 0.3-0.7 units/ml; aPTT 66-102 sec Monitor platelets by anticoagulation protocol: Yes   Plan:   -Coumadin 7.5mg  po tonight -Increase heparin to 850 units/hr  -aPTT in 8 hrs  -Daily aPTT, INR, CBC, and heparin level  Christoper Fabian, PharmD, BCPS Clinical pharmacist, pager [phone]  03/25/2015 4:55 AM

## 2015-03-25 NOTE — Progress Notes (Signed)
[facility] Progress Note   Katrina Peck  ZOX:[id number]  DOB: [birth year]  DOA: 03/22/2015 PCP: Maryelizabeth Rowan, MD  Brief narrative: Katrina Peck is a 69 y.o. female presenting to the hospital with dyspnea. H/o sCHF with EF of 10%, PAF on Eliquis. She states she has been short of breath for 1 month.    Subjective: No complaints today. Appears depressed.  Assessment/Plan: Active Problems:  systolic CHF with exacerbation - per heart failure team  PAF - changed from Eliquis to Xarelto as she refused to take Eliquis BID- now on Heparin/coumadin due to cardiac thrombus - on Amio and Dig per cardiology  Cardiac thrombus - large mural apical thrombus - found on ECHO  - now on Heparin/coumadin per cardiology   Severe anxiety -  Patient wants to avoid Benzodiazepines and I agree- have discussed relaxation techniques    Code Status:     Code Status Orders        Start     Ordered   03/22/15 2249  Full code   Continuous     03/22/15 2251     Family Communication:   Disposition Plan: per heart failure team DVT prophylaxis: Heparin Consultants: CHF team Procedures:  Antibiotics: Anti-infectives    Start     Dose/Rate Route Frequency Ordered Stop   03/24/15 1730  ceFAZolin (ANCEF) IVPB 2 g/50 mL premix     2 g 100 mL/hr over 30 Minutes Intravenous  Once 03/24/15 1716 03/24/15 1746   03/24/15 1715  ceFAZolin (ANCEF) 2-3 GM-% IVPB SOLR    Comments:  Katrina Peck   : cabinet override      03/24/15 1715 03/25/15 0529      Objective: Filed Weights   03/23/15 0614 03/24/15 0450 03/25/15 0352  Weight: 46.448 kg (102 lb 6.4 oz) 43.863 kg (96 lb 11.2 oz) 43.681 kg (96 lb 4.8 oz)    Intake/Output Summary (Last 24 hours) at 03/25/15 1253 Last data filed at 03/25/15 1027  Gross per 24 hour  Intake  902.5 ml  Output   1925 ml  Net -1022.5 ml     Vitals Filed Vitals:   03/25/15 0430 03/25/15 1027 03/25/15 1140 03/25/15 1142  BP:  102/75 95/76 106/71   Pulse:  91 88   Temp:   98.2 F (36.8 C)   TempSrc:   Oral   Resp:  19 18   Height:      Weight:      SpO2: 96% 97% 100%     Exam:  General:  Pt is alert, not in acute distress  HEENT: No icterus, No thrush, oral mucosa moist  Cardiovascular: regular rate and rhythm, S1/S2 No murmur  Respiratory: clear to auscultation bilaterally   Abdomen: Soft, +Bowel sounds, non tender, non distended, no guarding  MSK: No LE edema, cyanosis or clubbing  Data Reviewed: Basic Metabolic Panel:  Recent Labs Lab 03/22/15 1717 03/23/15 0432 03/24/15 0350 03/25/15 0410  NA 142 139 139 136  K 4.3 3.7 3.7 4.7  CL 110 109 106 100*  CO2 21* 20* 26 28  GLUCOSE 113* 115* 95 109*  BUN 26* 27* 27* 24*  CREATININE 1.04* 1.11* 1.02* 1.28*  CALCIUM 9.3 9.0 8.5* 8.6*   Liver Function Tests: No results for input(s): AST, ALT, ALKPHOS, BILITOT, PROT, ALBUMIN in the last 168 hours. No results for input(s): LIPASE, AMYLASE in the last 168 hours. No results for input(s): AMMONIA in the last 168 hours. CBC:  Recent Labs Lab 03/22/15 1717  03/24/15 0350 03/25/15 0410  WBC 8.2 6.9 7.2  HGB 12.4 12.1 12.9  HCT 39.8 38.3 40.2  MCV 88.2 86.8 86.8  PLT 199 185 212   Cardiac Enzymes:  Recent Labs Lab 03/23/15 0432  TROPONINI <0.03   BNP (last 3 results)  Recent Labs  11/11/14 1345 02/03/15 2310 03/22/15 2015  BNP 3454.9* 4003.4* >4500.0*    ProBNP (last 3 results) No results for input(s): PROBNP in the last 8760 hours.  CBG: No results for input(s): GLUCAP in the last 168 hours.  Recent Results (from the past 240 hour(s))  MRSA PCR Screening     Status: None   Collection Time: 03/23/15 12:03 AM  Result Value Ref Range Status   MRSA by PCR NEGATIVE NEGATIVE Final    Comment:        The GeneXpert MRSA Assay (FDA approved for NASAL specimens only), is one component of a comprehensive MRSA colonization surveillance program. It is not intended to diagnose MRSA infection  nor to guide or monitor treatment for MRSA infections.      Studies: Ir Fluoro Guide Cv Line Right  03/24/2015  INDICATION: Acute on chronic systolic CHF. Tunneled central venous catheter needed for cardiac medicine. EXAM: FLUOROSCOPIC AND ULTRASOUND GUIDED PLACEMENT OF A TUNNELED CENTRAL VENOUS CATHETER Physician: Rachelle Hora. Henn, MD FLUOROSCOPY TIME:  54 seconds, 3 mGy MEDICATIONS: Ancef 2 g. As antibiotic prophylaxis, Ancef was ordered pre-procedure and administered intravenously within one hour of incision. 1 mg Versed, 50 mcg fentanyl. A radiology nurse monitored the patient for moderate sedation. ANESTHESIA/SEDATION: Moderate sedation time: 20 minutes PROCEDURE: Informed consent was obtained for placement of a tunneled central venous catheter. The patient was placed supine on the interventional table. Ultrasound confirmed a patent right internal jugularvein. Ultrasound images were obtained for documentation. The right side of the neck was prepped and draped in a sterile fashion. The right side of the neck was anesthetized with 1% lidocaine. Maximal barrier sterile technique was utilized including caps, mask, sterile gowns, sterile gloves, sterile drape, hand hygiene and skin antiseptic. A small incision was made with #11 blade scalpel. A 21 gauge needle directed into the right internal jugular vein with ultrasound guidance. A micropuncture dilator set was placed. A dual lumen Powerline catheter was selected. The skin below the right clavicle was anesthetized and a small incision was made with an #11 blade scalpel. A subcutaneous tunnel was formed to the vein dermatotomy site. The catheter was brought through the tunnel. Catheter was cut to 24 cm. The vein dermatotomy site was dilated to accommodate a peel-away sheath. The catheter was placed through the peel-away sheath and directed into the central venous structures. The tip of the catheter was placed at the superior cavoatrial junction with  fluoroscopy. Fluoroscopic images were obtained for documentation. Both lumens were found to aspirate and flush well. The vein dermatotomy site was closed with Dermabond. Gel-Foam was placed in the subcutaneous tract. The catheter was secured to the skin using Prolene suture. FINDINGS: Catheter tip at the superior cavoatrial junction. Estimated blood loss: Minimal COMPLICATIONS: None IMPRESSION: Successful placement of a right jugular tunneled central venous catheter using ultrasound and fluoroscopic guidance. Electronically Signed   By: Richarda Overlie M.D.   On: 03/24/2015 18:39   Ir US Guide Vasc Access Right  03/24/2015  INDICATION: Acute on chronic systolic CHF. Tunneled central venous catheter needed for cardiac medicine. EXAM: FLUOROSCOPIC AND ULTRASOUND GUIDED PLACEMENT OF A TUNNELED CENTRAL VENOUS CATHETER Physician: Rachelle Hora. Lowella Dandy, MD FLUOROSCOPY TIME:  54 seconds, 3 mGy MEDICATIONS: Ancef 2 g. As antibiotic prophylaxis, Ancef was ordered pre-procedure and administered intravenously within one hour of incision. 1 mg Versed, 50 mcg fentanyl. A radiology nurse monitored the patient for moderate sedation. ANESTHESIA/SEDATION: Moderate sedation time: 20 minutes PROCEDURE: Informed consent was obtained for placement of a tunneled central venous catheter. The patient was placed supine on the interventional table. Ultrasound confirmed a patent right internal jugularvein. Ultrasound images were obtained for documentation. The right side of the neck was prepped and draped in a sterile fashion. The right side of the neck was anesthetized with 1% lidocaine. Maximal barrier sterile technique was utilized including caps, mask, sterile gowns, sterile gloves, sterile drape, hand hygiene and skin antiseptic. A small incision was made with #11 blade scalpel. A 21 gauge needle directed into the right internal jugular vein with ultrasound guidance. A micropuncture dilator set was placed. A dual lumen Powerline catheter was  selected. The skin below the right clavicle was anesthetized and a small incision was made with an #11 blade scalpel. A subcutaneous tunnel was formed to the vein dermatotomy site. The catheter was brought through the tunnel. Catheter was cut to 24 cm. The vein dermatotomy site was dilated to accommodate a peel-away sheath. The catheter was placed through the peel-away sheath and directed into the central venous structures. The tip of the catheter was placed at the superior cavoatrial junction with fluoroscopy. Fluoroscopic images were obtained for documentation. Both lumens were found to aspirate and flush well. The vein dermatotomy site was closed with Dermabond. Gel-Foam was placed in the subcutaneous tract. The catheter was secured to the skin using Prolene suture. FINDINGS: Catheter tip at the superior cavoatrial junction. Estimated blood loss: Minimal COMPLICATIONS: None IMPRESSION: Successful placement of a right jugular tunneled central venous catheter using ultrasound and fluoroscopic guidance. Electronically Signed   By: Richarda Overlie M.D.   On: 03/24/2015 18:39    Scheduled Meds:  Scheduled Meds: . amiodarone  100 mg Oral Daily  . digoxin  0.0625 mg Oral Daily  . sodium chloride  10-40 mL Intracatheter Q12H  . sodium chloride  3 mL Intravenous Q12H  . spironolactone  12.5 mg Oral Daily  . torsemide  20 mg Oral Daily  . warfarin  7.5 mg Oral ONCE-1800  . warfarin   Does not apply Once  . Warfarin - Pharmacist Dosing Inpatient   Does not apply q1800   Continuous Infusions: . heparin 850 Units/hr (03/25/15 0500)  . milrinone 0.25 mcg/kg/min (03/25/15 1218)    Time spent on care of this patient: 35 min   Katrina Kravitz, MD 03/25/2015, 12:53 PM  LOS: 3 days   [facility] Office  [phone] Pager - Text Page per [email or web address] If 7PM-7AM, please contact night-coverage [email or web address]

## 2015-03-25 NOTE — Progress Notes (Signed)
ANTICOAGULATION CONSULT NOTE - Follow-up Consult  Pharmacy Consult for heparin Indication: LV thrombus  No Known Allergies  Patient Measurements: Height: 5\' 2"  (157.5 cm) Weight: 96 lb 4.8 oz (43.681 kg) IBW/kg (Calculated) : 50.1  Vital Signs: Temp: 98.2 F (36.8 C) (12/30 1534) Temp Source: Oral (12/30 1534) BP: 90/66 mmHg (12/30 1534) Pulse Rate: 89 (12/30 1534)  Labs:  Recent Labs  03/22/15 1717 03/23/15 0432 03/23/15 1922 03/24/15 0350 03/25/15 0410 03/25/15 1400  HGB 12.4  --   --  12.1 12.9  --   HCT 39.8  --   --  38.3 40.2  --   PLT 199  --   --  185 212  --   APTT  --   --   --  39* 40* 49*  LABPROT  --   --  17.6* 17.4* 16.5*  --   INR  --   --  1.44 1.42 1.32  --   HEPARINUNFRC  --   --   --  0.97* 0.54  --   CREATININE 1.04* 1.11*  --  1.02* 1.28*  --   TROPONINI  --  <0.03  --   --   --   --     Estimated Creatinine Clearance: 29 mL/min (by C-G formula based on Cr of 1.28).  Assessment: 69 yo female with afib on apixiban PTA and now noted with LV thrombus (unable to use NOACs). Pt on heparin bridge to coumadin. Last dose of apixiban unknown but apixaban still affecting heparin level. Using aPTT to monitor heparin until aPTT and heparin level correlating. aPTT 49 sec on 850 units/hr.   Goal of Therapy:  Heparin level 0.3-0.7 units/ml; aPTT 66-102 sec Monitor platelets by anticoagulation protocol: Yes   Plan:   -Increase heparin to 950 units/hr  -Daily aPTT, INR, CBC, and heparin level  Harland German, Pharm D 03/25/2015 4:29 PM

## 2015-03-26 LAB — BASIC METABOLIC PANEL
Anion gap: 9 (ref 5–15)
BUN: 28 mg/dL — AB (ref 6–20)
CALCIUM: 8.3 mg/dL — AB (ref 8.9–10.3)
CO2: 27 mmol/L (ref 22–32)
CREATININE: 0.88 mg/dL (ref 0.44–1.00)
Chloride: 101 mmol/L (ref 101–111)
GFR calc non Af Amer: >60 mL/min (ref >60)
GLUCOSE: 95 mg/dL (ref 65–99)
Potassium: 3.8 mmol/L (ref 3.5–5.1)
Sodium: 137 mmol/L (ref 135–145)

## 2015-03-26 LAB — HEPARIN LEVEL (UNFRACTIONATED)
HEPARIN UNFRACTIONATED: 0.6 [IU]/mL (ref 0.30–0.70)
Heparin Unfractionated: 0.67 IU/mL (ref 0.30–0.70)
Heparin Unfractionated: 0.7 IU/mL (ref 0.30–0.70)

## 2015-03-26 LAB — CBC
HCT: 38.2 % (ref 36.0–46.0)
Hemoglobin: 12.3 g/dL (ref 12.0–15.0)
MCH: 28 pg (ref 26.0–34.0)
MCHC: 32.2 g/dL (ref 30.0–36.0)
MCV: 87 fL (ref 78.0–100.0)
PLATELETS: 180 10*3/uL (ref 150–400)
RBC: 4.39 MIL/uL (ref 3.87–5.11)
RDW: 16.5 % — AB (ref 11.5–15.5)
WBC: 6.1 10*3/uL (ref 4.0–10.5)

## 2015-03-26 LAB — PROTIME-INR
INR: 1.48 (ref 0.00–1.49)
Prothrombin Time: 18 seconds — ABNORMAL HIGH (ref 11.6–15.2)

## 2015-03-26 LAB — CARBOXYHEMOGLOBIN
CARBOXYHEMOGLOBIN: 1.3 % (ref 0.5–1.5)
METHEMOGLOBIN: 0.6 % (ref 0.0–1.5)
O2 SAT: 66.1 %
Total hemoglobin: 12.6 g/dL (ref 12.0–16.0)

## 2015-03-26 LAB — APTT: APTT: 103 s — AB (ref 24–37)

## 2015-03-26 MED ORDER — WARFARIN SODIUM 7.5 MG PO TABS
7.5000 mg | ORAL_TABLET | Freq: Once | ORAL | Status: AC
  Administered 2015-03-26: 7.5 mg via ORAL
  Filled 2015-03-26: qty 1

## 2015-03-26 NOTE — Progress Notes (Signed)
Patient ID: Katrina Peck, female   DOB: [birth year], 69 y.o.   MRN: [id number]  Advanced Heart Failure Rounding Note  Primary Physician: Dr Maryelizabeth Rowan Primary Cardiologist: Dr. Shirlee Latch   Subjective:    Tunneled PICC in place.  Co-ox 66% this am on 0.25 milrinone. Breathing significantly improved on milrinone and with diuresis.  INR 1.48 today.   Objective:   Weight Range: 95 lb 6.4 oz (43.273 kg) Body mass index is 17.44 kg/(m^2).   Vital Signs:   Temp:  [97.2 F (36.2 C)-98.2 F (36.8 C)] 97.2 F (36.2 C) (12/31 0838) Pulse Rate:  [73-96] 77 (12/31 0838) Resp:  [16-19] 18 (12/31 0838) BP: (90-110)/(64-90) 96/67 mmHg (12/31 0838) SpO2:  [97 %-100 %] 100 % (12/31 0838) Weight:  [95 lb 6.4 oz (43.273 kg)] 95 lb 6.4 oz (43.273 kg) (12/31 0500) Last BM Date: 03/23/15  Weight change: Filed Weights   03/24/15 0450 03/25/15 0352 03/26/15 0500  Weight: 96 lb 11.2 oz (43.863 kg) 96 lb 4.8 oz (43.681 kg) 95 lb 6.4 oz (43.273 kg)    Intake/Output:   Intake/Output Summary (Last 24 hours) at 03/26/15 0948 Last data filed at 03/26/15 9163  Gross per 24 hour  Intake 1034.12 ml  Output   2300 ml  Net -1265.88 ml     Physical Exam: General: Chronically ill and elderly appearing HEENT: normal Neck: supple. JVP 5-6. Carotids 2+ bilat; no bruits. No thyromegaly or nodule noted. Cor: PMI nondisplaced. RRR. Paradoxical S2 split. Tunneled IJ R chest. Lungs: CTAB Abdomen: soft, NT, ND, no HSM. No bruits or masses. Bowel sounds present. Extremities: no cyanosis, clubbing, rash. Trace ankle edema. Neuro: alert & oriented x 3, cranial nerves grossly intact. moves all 4 extremities w/o difficulty. Affect pleasant.  Telemetry: Reviewed personally, NSR 80s, occasional PVCs  Labs: CBC  Recent Labs  03/25/15 0410 03/26/15 0515  WBC 7.2 6.1  HGB 12.9 12.3  HCT 40.2 38.2  MCV 86.8 87.0  PLT 212 180   Basic Metabolic Panel  Recent Labs  03/25/15 0410 03/26/15 0515  NA 136  137  K 4.7 3.8  CL 100* 101  CO2 28 27  GLUCOSE 109* 95  BUN 24* 28*  CREATININE 1.28* 0.88  CALCIUM 8.6* 8.3*   Liver Function Tests No results for input(s): AST, ALT, ALKPHOS, BILITOT, PROT, ALBUMIN in the last 72 hours. No results for input(s): LIPASE, AMYLASE in the last 72 hours. Cardiac Enzymes No results for input(s): CKTOTAL, CKMB, CKMBINDEX, TROPONINI in the last 72 hours.  BNP: BNP (last 3 results)  Recent Labs  11/11/14 1345 02/03/15 2310 03/22/15 2015  BNP 3454.9* 4003.4* >4500.0*    ProBNP (last 3 results) No results for input(s): PROBNP in the last 8760 hours.   D-Dimer No results for input(s): DDIMER in the last 72 hours. Hemoglobin A1C No results for input(s): HGBA1C in the last 72 hours. Fasting Lipid Panel No results for input(s): CHOL, HDL, LDLCALC, TRIG, CHOLHDL, LDLDIRECT in the last 72 hours. Thyroid Function Tests No results for input(s): TSH, T4TOTAL, T3FREE, THYROIDAB in the last 72 hours.  Invalid input(s): FREET3  Other results:   Imaging/Studies:  Ir Fluoro Guide Cv Line Right  03/24/2015  INDICATION: Acute on chronic systolic CHF. Tunneled central venous catheter needed for cardiac medicine. EXAM: FLUOROSCOPIC AND ULTRASOUND GUIDED PLACEMENT OF A TUNNELED CENTRAL VENOUS CATHETER Physician: Rachelle Hora. Henn, MD FLUOROSCOPY TIME:  54 seconds, 3 mGy MEDICATIONS: Ancef 2 g. As antibiotic prophylaxis, Ancef was ordered pre-procedure and  administered intravenously within one hour of incision. 1 mg Versed, 50 mcg fentanyl. A radiology nurse monitored the patient for moderate sedation. ANESTHESIA/SEDATION: Moderate sedation time: 20 minutes PROCEDURE: Informed consent was obtained for placement of a tunneled central venous catheter. The patient was placed supine on the interventional table. Ultrasound confirmed a patent right internal jugularvein. Ultrasound images were obtained for documentation. The right side of the neck was prepped and draped in a  sterile fashion. The right side of the neck was anesthetized with 1% lidocaine. Maximal barrier sterile technique was utilized including caps, mask, sterile gowns, sterile gloves, sterile drape, hand hygiene and skin antiseptic. A small incision was made with #11 blade scalpel. A 21 gauge needle directed into the right internal jugular vein with ultrasound guidance. A micropuncture dilator set was placed. A dual lumen Powerline catheter was selected. The skin below the right clavicle was anesthetized and a small incision was made with an #11 blade scalpel. A subcutaneous tunnel was formed to the vein dermatotomy site. The catheter was brought through the tunnel. Catheter was cut to 24 cm. The vein dermatotomy site was dilated to accommodate a peel-away sheath. The catheter was placed through the peel-away sheath and directed into the central venous structures. The tip of the catheter was placed at the superior cavoatrial junction with fluoroscopy. Fluoroscopic images were obtained for documentation. Both lumens were found to aspirate and flush well. The vein dermatotomy site was closed with Dermabond. Gel-Foam was placed in the subcutaneous tract. The catheter was secured to the skin using Prolene suture. FINDINGS: Catheter tip at the superior cavoatrial junction. Estimated blood loss: Minimal COMPLICATIONS: None IMPRESSION: Successful placement of a right jugular tunneled central venous catheter using ultrasound and fluoroscopic guidance. Electronically Signed   By: Richarda Overlie M.D.   On: 03/24/2015 18:39   Ir US Guide Vasc Access Right  03/24/2015  INDICATION: Acute on chronic systolic CHF. Tunneled central venous catheter needed for cardiac medicine. EXAM: FLUOROSCOPIC AND ULTRASOUND GUIDED PLACEMENT OF A TUNNELED CENTRAL VENOUS CATHETER Physician: Rachelle Hora. Henn, MD FLUOROSCOPY TIME:  54 seconds, 3 mGy MEDICATIONS: Ancef 2 g. As antibiotic prophylaxis, Ancef was ordered pre-procedure and administered  intravenously within one hour of incision. 1 mg Versed, 50 mcg fentanyl. A radiology nurse monitored the patient for moderate sedation. ANESTHESIA/SEDATION: Moderate sedation time: 20 minutes PROCEDURE: Informed consent was obtained for placement of a tunneled central venous catheter. The patient was placed supine on the interventional table. Ultrasound confirmed a patent right internal jugularvein. Ultrasound images were obtained for documentation. The right side of the neck was prepped and draped in a sterile fashion. The right side of the neck was anesthetized with 1% lidocaine. Maximal barrier sterile technique was utilized including caps, mask, sterile gowns, sterile gloves, sterile drape, hand hygiene and skin antiseptic. A small incision was made with #11 blade scalpel. A 21 gauge needle directed into the right internal jugular vein with ultrasound guidance. A micropuncture dilator set was placed. A dual lumen Powerline catheter was selected. The skin below the right clavicle was anesthetized and a small incision was made with an #11 blade scalpel. A subcutaneous tunnel was formed to the vein dermatotomy site. The catheter was brought through the tunnel. Catheter was cut to 24 cm. The vein dermatotomy site was dilated to accommodate a peel-away sheath. The catheter was placed through the peel-away sheath and directed into the central venous structures. The tip of the catheter was placed at the superior cavoatrial junction with fluoroscopy. Fluoroscopic  images were obtained for documentation. Both lumens were found to aspirate and flush well. The vein dermatotomy site was closed with Dermabond. Gel-Foam was placed in the subcutaneous tract. The catheter was secured to the skin using Prolene suture. FINDINGS: Catheter tip at the superior cavoatrial junction. Estimated blood loss: Minimal COMPLICATIONS: None IMPRESSION: Successful placement of a right jugular tunneled central venous catheter using ultrasound and  fluoroscopic guidance. Electronically Signed   By: Richarda Overlie M.D.   On: 03/24/2015 18:39    Latest Echo  Latest Cath   Medications:     Scheduled Medications: . amiodarone  100 mg Oral Daily  . digoxin  0.0625 mg Oral Daily  . sodium chloride  10-40 mL Intracatheter Q12H  . sodium chloride  3 mL Intravenous Q12H  . spironolactone  12.5 mg Oral Daily  . torsemide  20 mg Oral Daily  . Warfarin - Pharmacist Dosing Inpatient   Does not apply q1800    Infusions: . heparin 950 Units/hr (03/25/15 1753)  . milrinone 0.25 mcg/kg/min (03/25/15 1218)    PRN Medications: sodium chloride, acetaminophen, ondansetron (ZOFRAN) IV, sodium chloride, sodium chloride   Assessment/Plan   Katrina Peck is a 69 y.o. female with hx of Chronic systolic HF due to NICM (EF 10-15% by Echo 02/04/15), PAF, hx of pericardial effusion s/p pericardial window 07/2014 who presented to [facility] 03/21/25 with worsening SOB. HF team consulted to assist in management.   1. Acute on chronic systolic CHF: Low output CHF, was on home milrinone until she insisted that PICC be removed. End stage heart failure, EF 10-15%. She was admitted with acute on chronic systolic HF, now back on milrinone via RIJ tunneled catheter.  She seems to be doing ok with this line so far.  Breathing is much better.  Volume status looks good this morning.  - Continue current milrinone, will need for home.  - Continue current torsemide.  - Continue current digoxin and spironolactone. - Poor candidate for advanced therapies with significant noncompliance.  She has not been interested in LVAD when discussed in the past either.  2. Atrial fibrillation: Remains in NSR on amiodarone. - Continue coumadin with heparin bridge. Pharmacy dosing.  3. LV thrombus: Found on Echo 03/23/15. Had only been taking Eliquis once daily despite multiple re-counseling attempts. She is now on heparin bridge to therapeutic warfarin.  Importance of INR followup after  discharge stressed multiple times now. She does not think that she and her husband will be able to manage Lovenox at home, so will stay on heparin gtt here until INR > 2.   Length of Stay: 4  [name]  03/26/2015, 9:48 AM

## 2015-03-26 NOTE — Progress Notes (Signed)
[facility] Progress Note   Katrina Peck  VWU:[id number]  DOB: August 29, 1945  DOA: 03/22/2015 PCP: Maryelizabeth Rowan, MD  Brief narrative: Katrina Peck is a 69 y.o. female presenting to the hospital with dyspnea. H/o sCHF with EF of 10%, PAF on Eliquis. She states she has been short of breath for 1 month.   Subjective: Breathing is much better. No chest pain.   Assessment/Plan: Active Problems:  systolic CHF with exacerbation - per heart failure team  PAF - changed from Eliquis to Xarelto as she refused to take Eliquis BID- now on Heparin/coumadin due to cardiac thrombus - on Amio and Dig per cardiology  Cardiac thrombus - large mural apical thrombus - found on ECHO  - now on Heparin/coumadin per cardiology   Severe anxiety -  Patient wants to avoid Benzodiazepines and I agree- have discussed relaxation techniques    Code Status:     Code Status Orders        Start     Ordered   03/22/15 2249  Full code   Continuous     03/22/15 2251     Family Communication:   Disposition Plan: per heart failure team DVT prophylaxis: Heparin Consultants: CHF team Procedures:  Antibiotics: Anti-infectives    Start     Dose/Rate Route Frequency Ordered Stop   03/24/15 1730  ceFAZolin (ANCEF) IVPB 2 g/50 mL premix     2 g 100 mL/hr over 30 Minutes Intravenous  Once 03/24/15 1716 03/24/15 1746   03/24/15 1715  ceFAZolin (ANCEF) 2-3 GM-% IVPB SOLR    Comments:  Victorino Dike   : cabinet override      03/24/15 1715 03/25/15 0529      Objective: Filed Weights   03/24/15 0450 03/25/15 0352 03/26/15 0500  Weight: 43.863 kg (96 lb 11.2 oz) 43.681 kg (96 lb 4.8 oz) 43.273 kg (95 lb 6.4 oz)    Intake/Output Summary (Last 24 hours) at 03/26/15 1237 Last data filed at 03/26/15 1021  Gross per 24 hour  Intake 1128.62 ml  Output   1950 ml  Net -821.38 ml     Vitals Filed Vitals:   03/25/15 2049 03/26/15 0009 03/26/15 0500 03/26/15 0838  BP: 104/64 99/77 110/90  96/67  Pulse: 96 87 73 77  Temp: 98.1 F (36.7 C) 97.7 F (36.5 C) 97.4 F (36.3 C) 97.2 F (36.2 C)  TempSrc: Oral Oral Oral Oral  Resp: Height:      Weight:   43.273 kg (95 lb 6.4 oz)   SpO2: 99% 97% 100% 100%    Exam:  General:  Pt is alert, not in acute distress  HEENT: No icterus, No thrush, oral mucosa moist  Cardiovascular: regular rate and rhythm, S1/S2 No murmur  Respiratory: clear to auscultation bilaterally   Abdomen: Soft, +Bowel sounds, non tender, non distended, no guarding  MSK: No LE edema, cyanosis or clubbing  Data Reviewed: Basic Metabolic Panel:  Recent Labs Lab 03/22/15 1717 03/23/15 0432 03/24/15 0350 03/25/15 0410 03/26/15 0515  NA 142 139 139 136 137  K 4.3 3.7 3.7 4.7 3.8  CL 110 109 106 100* 101  CO2 21* 20* GLUCOSE 113* 115* 95 109* 95  BUN 26* 27* 27* 24* 28*  CREATININE 1.04* 1.11* 1.02* 1.28* 0.88  CALCIUM 9.3 9.0 8.5* 8.6* 8.3*   Liver Function Tests: No results for input(s): AST, ALT, ALKPHOS, BILITOT, PROT, ALBUMIN in the last 168 hours. No results for  input(s): LIPASE, AMYLASE in the last 168 hours. No results for input(s): AMMONIA in the last 168 hours. CBC:  Recent Labs Lab 03/22/15 1717 03/24/15 0350 03/25/15 0410 03/26/15 0515  WBC 8.2 6.9 7.2 6.1  HGB 12.4 12.1 12.9 12.3  HCT 39.8 38.3 40.2 38.2  MCV 88.2 86.8 86.8 87.0  PLT 199 185 212 180   Cardiac Enzymes:  Recent Labs Lab 03/23/15 0432  TROPONINI <0.03   BNP (last 3 results)  Recent Labs  11/11/14 1345 02/03/15 2310 03/22/15 2015  BNP 3454.9* 4003.4* >4500.0*    ProBNP (last 3 results) No results for input(s): PROBNP in the last 8760 hours.  CBG: No results for input(s): GLUCAP in the last 168 hours.  Recent Results (from the past 240 hour(s))  MRSA PCR Screening     Status: None   Collection Time: 03/23/15 12:03 AM  Result Value Ref Range Status   MRSA by PCR NEGATIVE NEGATIVE Final    Comment:        The  GeneXpert MRSA Assay (FDA approved for NASAL specimens only), is one component of a comprehensive MRSA colonization surveillance program. It is not intended to diagnose MRSA infection nor to guide or monitor treatment for MRSA infections.      Studies: Ir Fluoro Guide Cv Line Right  03/24/2015  INDICATION: Acute on chronic systolic CHF. Tunneled central venous catheter needed for cardiac medicine. EXAM: FLUOROSCOPIC AND ULTRASOUND GUIDED PLACEMENT OF A TUNNELED CENTRAL VENOUS CATHETER Physician: Rachelle Hora. Henn, MD FLUOROSCOPY TIME:  54 seconds, 3 mGy MEDICATIONS: Ancef 2 g. As antibiotic prophylaxis, Ancef was ordered pre-procedure and administered intravenously within one hour of incision. 1 mg Versed, 50 mcg fentanyl. A radiology nurse monitored the patient for moderate sedation. ANESTHESIA/SEDATION: Moderate sedation time: 20 minutes PROCEDURE: Informed consent was obtained for placement of a tunneled central venous catheter. The patient was placed supine on the interventional table. Ultrasound confirmed a patent right internal jugularvein. Ultrasound images were obtained for documentation. The right side of the neck was prepped and draped in a sterile fashion. The right side of the neck was anesthetized with 1% lidocaine. Maximal barrier sterile technique was utilized including caps, mask, sterile gowns, sterile gloves, sterile drape, hand hygiene and skin antiseptic. A small incision was made with #11 blade scalpel. A 21 gauge needle directed into the right internal jugular vein with ultrasound guidance. A micropuncture dilator set was placed. A dual lumen Powerline catheter was selected. The skin below the right clavicle was anesthetized and a small incision was made with an #11 blade scalpel. A subcutaneous tunnel was formed to the vein dermatotomy site. The catheter was brought through the tunnel. Catheter was cut to 24 cm. The vein dermatotomy site was dilated to accommodate a peel-away sheath.  The catheter was placed through the peel-away sheath and directed into the central venous structures. The tip of the catheter was placed at the superior cavoatrial junction with fluoroscopy. Fluoroscopic images were obtained for documentation. Both lumens were found to aspirate and flush well. The vein dermatotomy site was closed with Dermabond. Gel-Foam was placed in the subcutaneous tract. The catheter was secured to the skin using Prolene suture. FINDINGS: Catheter tip at the superior cavoatrial junction. Estimated blood loss: Minimal COMPLICATIONS: None IMPRESSION: Successful placement of a right jugular tunneled central venous catheter using ultrasound and fluoroscopic guidance. Electronically Signed   By: Richarda Overlie M.D.   On: 03/24/2015 18:39   Ir US Guide Vasc Access Right  03/24/2015  INDICATION:  Acute on chronic systolic CHF. Tunneled central venous catheter needed for cardiac medicine. EXAM: FLUOROSCOPIC AND ULTRASOUND GUIDED PLACEMENT OF A TUNNELED CENTRAL VENOUS CATHETER Physician: Rachelle Hora. Henn, MD FLUOROSCOPY TIME:  54 seconds, 3 mGy MEDICATIONS: Ancef 2 g. As antibiotic prophylaxis, Ancef was ordered pre-procedure and administered intravenously within one hour of incision. 1 mg Versed, 50 mcg fentanyl. A radiology nurse monitored the patient for moderate sedation. ANESTHESIA/SEDATION: Moderate sedation time: 20 minutes PROCEDURE: Informed consent was obtained for placement of a tunneled central venous catheter. The patient was placed supine on the interventional table. Ultrasound confirmed a patent right internal jugularvein. Ultrasound images were obtained for documentation. The right side of the neck was prepped and draped in a sterile fashion. The right side of the neck was anesthetized with 1% lidocaine. Maximal barrier sterile technique was utilized including caps, mask, sterile gowns, sterile gloves, sterile drape, hand hygiene and skin antiseptic. A small incision was made with #11 blade  scalpel. A 21 gauge needle directed into the right internal jugular vein with ultrasound guidance. A micropuncture dilator set was placed. A dual lumen Powerline catheter was selected. The skin below the right clavicle was anesthetized and a small incision was made with an #11 blade scalpel. A subcutaneous tunnel was formed to the vein dermatotomy site. The catheter was brought through the tunnel. Catheter was cut to 24 cm. The vein dermatotomy site was dilated to accommodate a peel-away sheath. The catheter was placed through the peel-away sheath and directed into the central venous structures. The tip of the catheter was placed at the superior cavoatrial junction with fluoroscopy. Fluoroscopic images were obtained for documentation. Both lumens were found to aspirate and flush well. The vein dermatotomy site was closed with Dermabond. Gel-Foam was placed in the subcutaneous tract. The catheter was secured to the skin using Prolene suture. FINDINGS: Catheter tip at the superior cavoatrial junction. Estimated blood loss: Minimal COMPLICATIONS: None IMPRESSION: Successful placement of a right jugular tunneled central venous catheter using ultrasound and fluoroscopic guidance. Electronically Signed   By: Richarda Overlie M.D.   On: 03/24/2015 18:39    Scheduled Meds:  Scheduled Meds: . amiodarone  100 mg Oral Daily  . digoxin  0.0625 mg Oral Daily  . sodium chloride  10-40 mL Intracatheter Q12H  . sodium chloride  3 mL Intravenous Q12H  . spironolactone  12.5 mg Oral Daily  . torsemide  20 mg Oral Daily  . Warfarin - Pharmacist Dosing Inpatient   Does not apply q1800   Continuous Infusions: . heparin 950 Units/hr (03/25/15 1753)  . milrinone 0.25 mcg/kg/min (03/25/15 1218)    Time spent on care of this patient: 35 min   Caitlan Chauca, MD 03/26/2015, 12:37 PM  LOS: 4 days   [facility] Office  [phone] Pager - Text Page per [email or web address] If 7PM-7AM, please contact night-coverage  [email or web address]

## 2015-03-26 NOTE — Progress Notes (Signed)
ANTICOAGULATION CONSULT NOTE - Follow Up Consult  Pharmacy Consult for heparin Indication: LV Thrombus  No Known Allergies  Patient Measurements: Height: 5\' 2"  (157.5 cm) Weight: 95 lb 6.4 oz (43.273 kg) IBW/kg (Calculated) : 50.1  Vital Signs: Temp: 98.6 F (37 C) (12/31 2010) Temp Source: Oral (12/31 2010) BP: 112/70 mmHg (12/31 2010) Pulse Rate: 87 (12/31 2010)  Labs:  Recent Labs  03/24/15 0350 03/25/15 0410 03/25/15 1400 03/26/15 0515 03/26/15 1200 03/26/15 2055  HGB 12.1 12.9  --  12.3  --   --   HCT 38.3 40.2  --  38.2  --   --   PLT 185 212  --  180  --   --   APTT 39* 40* 49* 103*  --   --   LABPROT 17.4* 16.5*  --  18.0*  --   --   INR 1.42 1.32  --  1.48  --   --   HEPARINUNFRC 0.97* 0.54  --  0.67 0.70 0.60  CREATININE 1.02* 1.28*  --  0.88  --   --     Estimated Creatinine Clearance: 41.8 mL/min (by C-G formula based on Cr of 0.88).   Assessment: 69 yo female with afib on apixiban PTA and now noted with LV thrombus (unable to use NOACs). Pt on heparin bridge to coumadin. HL and aPTT correlating. HL therapeutic at 0.6. CBC stable, no s/s of bleed.  Goal of Therapy:  Heparin level 0.3-0.7 units/ml Monitor platelets by anticoagulation protocol: Yes   Plan:  Continue heparin gtt at 900 units/hr Monitor daily HL, CBC, s/s of bleed  Enzo Bi, PharmD, [facility] Clinical Pharmacist Pager [phone] 03/26/2015 9:30 PM

## 2015-03-26 NOTE — Progress Notes (Signed)
ANTICOAGULATION CONSULT NOTE - Follow Up Consult  Pharmacy Consult for Heparin  Indication: LV thrombus  No Known Allergies  Patient Measurements: Height: 5\' 2"  (157.5 cm) Weight: 95 lb 6.4 oz (43.273 kg) IBW/kg (Calculated) : 50.1  Vital Signs: Temp: 97.4 F (36.3 C) (12/31 0500) Temp Source: Oral (12/31 0500) BP: 110/90 mmHg (12/31 0500) Pulse Rate: 73 (12/31 0500)  Labs:  Recent Labs  03/24/15 0350 03/25/15 0410 03/25/15 1400 03/26/15 0515  HGB 12.1 12.9  --  12.3  HCT 38.3 40.2  --  38.2  PLT 185 212  --  180  APTT 39* 40* 49* 103*  LABPROT 17.4* 16.5*  --  18.0*  INR 1.42 1.32  --  1.48  HEPARINUNFRC 0.97* 0.54  --  0.67  CREATININE 1.02* 1.28*  --  0.88    Estimated Creatinine Clearance: 41.8 mL/min (by C-G formula based on Cr of 0.88).    Assessment: Heparin level is within therapeutic range this AM. HL and aPTT appear to correlate this AM, so will start using HL only to dose from now on. CBC good this AM.   Goal of Therapy:  Heparin level 0.3-0.7 units/ml Monitor platelets by anticoagulation protocol: Yes   Plan:  -Continue heparin at 950 units/hr -1200 HL to confirm  Abran Duke 03/26/2015,6:25 AM

## 2015-03-26 NOTE — Progress Notes (Signed)
ANTICOAGULATION CONSULT NOTE - Follow Up Consult  Pharmacy Consult for Heparin  Indication: LV thrombus  No Known Allergies  Patient Measurements: Height: 5\' 2"  (157.5 cm) Weight: 95 lb 6.4 oz (43.273 kg) IBW/kg (Calculated) : 50.1  Vital Signs: Temp: 98.3 F (36.8 C) (12/31 1329) Temp Source: Oral (12/31 1329) BP: 110/67 mmHg (12/31 1329) Pulse Rate: 86 (12/31 1329)  Labs:  Recent Labs  03/24/15 0350 03/25/15 0410 03/25/15 1400 03/26/15 0515 03/26/15 1200  HGB 12.1 12.9  --  12.3  --   HCT 38.3 40.2  --  38.2  --   PLT 185 212  --  180  --   APTT 39* 40* 49* 103*  --   LABPROT 17.4* 16.5*  --  18.0*  --   INR 1.42 1.32  --  1.48  --   HEPARINUNFRC 0.97* 0.54  --  0.67 0.70  CREATININE 1.02* 1.28*  --  0.88  --     Estimated Creatinine Clearance: 41.8 mL/min (by C-G formula based on Cr of 0.88).    Assessment: HL and aPTT appear to correlate this AM, with HL being therapeutic at 0.67 on 950 units/hr. Confirmatory level this afternoon is borderline supratherapeutic. Will slightly decrease rate as to stay within range. INR 1.32>1.48 after 7.5mg  warfarin yesterday. H/H and plt stable. No bleeding reported.   DDI: Amiodarone  Goal of Therapy:  INR-2-3 Heparin level 0.3-0.7 units/ml Monitor platelets by anticoagulation protocol: Yes   Plan:  Decrease heparin gtt to 900 units/hr  Coumadin 7.5mg  tonight 6hr HL to confirm Daily INR, HL, CBC Monitor renal fcn, s/sx bleeding  Reuel Derby, PharmD Clinical Pharmacy Resident 03/26/2015,2:35 PM

## 2015-03-27 LAB — CARBOXYHEMOGLOBIN
Carboxyhemoglobin: 1.2 % (ref 0.5–1.5)
Methemoglobin: 0.8 % (ref 0.0–1.5)
O2 SAT: 74.6 %
TOTAL HEMOGLOBIN: 12.3 g/dL (ref 12.0–16.0)

## 2015-03-27 LAB — BASIC METABOLIC PANEL
Anion gap: 9 (ref 5–15)
BUN: 25 mg/dL — AB (ref 6–20)
CHLORIDE: 103 mmol/L (ref 101–111)
CO2: 25 mmol/L (ref 22–32)
Calcium: 8.8 mg/dL — ABNORMAL LOW (ref 8.9–10.3)
Creatinine, Ser: 0.98 mg/dL (ref 0.44–1.00)
GFR calc Af Amer: >60 mL/min (ref >60)
GFR, EST NON AFRICAN AMERICAN: 58 mL/min — AB (ref >60)
Glucose, Bld: 103 mg/dL — ABNORMAL HIGH (ref 65–99)
POTASSIUM: 4 mmol/L (ref 3.5–5.1)
SODIUM: 137 mmol/L (ref 135–145)

## 2015-03-27 LAB — HEPARIN LEVEL (UNFRACTIONATED): HEPARIN UNFRACTIONATED: 0.48 [IU]/mL (ref 0.30–0.70)

## 2015-03-27 LAB — CBC
HEMATOCRIT: 36 % (ref 36.0–46.0)
HEMOGLOBIN: 11.4 g/dL — AB (ref 12.0–15.0)
MCH: 27.5 pg (ref 26.0–34.0)
MCHC: 31.7 g/dL (ref 30.0–36.0)
MCV: 87 fL (ref 78.0–100.0)
Platelets: 201 10*3/uL (ref 150–400)
RBC: 4.14 MIL/uL (ref 3.87–5.11)
RDW: 16.4 % — ABNORMAL HIGH (ref 11.5–15.5)
WBC: 5.3 10*3/uL (ref 4.0–10.5)

## 2015-03-27 LAB — PROTIME-INR
INR: 1.51 — ABNORMAL HIGH (ref 0.00–1.49)
PROTHROMBIN TIME: 18.2 s — AB (ref 11.6–15.2)

## 2015-03-27 MED ORDER — WARFARIN SODIUM 5 MG PO TABS
9.0000 mg | ORAL_TABLET | Freq: Once | ORAL | Status: AC
  Administered 2015-03-27: 9 mg via ORAL
  Filled 2015-03-27: qty 1

## 2015-03-27 NOTE — Progress Notes (Signed)
ANTICOAGULATION CONSULT NOTE - Follow Up Consult  Pharmacy Consult for heparin/warfarin Indication: LV Thrombus  No Known Allergies  Patient Measurements: Height: 5\' 2"  (157.5 cm) Weight: 96 lb 14.4 oz (43.954 kg) IBW/kg (Calculated) : 50.1  Vital Signs: Temp: 97.8 F (36.6 C) (01/01 0507) Temp Source: Oral (01/01 0507) BP: 98/64 mmHg (01/01 0507) Pulse Rate: 78 (01/01 0507)  Labs:  Recent Labs  03/25/15 0410 03/25/15 1400 03/26/15 0515 03/26/15 1200 03/26/15 2055 03/27/15 0500  HGB 12.9  --  12.3  --   --  11.4*  HCT 40.2  --  38.2  --   --  36.0  PLT 212  --  180  --   --  201  APTT 40* 49* 103*  --   --   --   LABPROT 16.5*  --  18.0*  --   --  18.2*  INR 1.32  --  1.48  --   --  1.51*  HEPARINUNFRC 0.54  --  0.67 0.70 0.60 0.48  CREATININE 1.28*  --  0.88  --   --  0.98    Estimated Creatinine Clearance: 38.2 mL/min (by C-G formula based on Cr of 0.98).   Assessment: 70 yo female with afib on apixiban PTA and now noted with LV thrombus (unable to use NOACs). Pt on heparin bridge to coumadin.HL remains therapeutic at 0.48 on 900 units/hr. Will continue at current rate. INR 1.48>1.51 after 7.5mg  warfarin yesterday; day #4 VKA/heparin bridge. Hgb 12.2>11.4, plt 201. No bleeding reported.   DDI: Amiodarone   Goal of Therapy:  INR 2-3 Heparin level 0.3-0.7 units/ml Monitor platelets by anticoagulation protocol: Yes   Plan:  Continue heparin gtt to 900 units/hr  Coumadin 9mg  tonight Daily INR, HL, CBC Monitor renal fcn, s/sx bleeding   Sherle Poe Clinical Pharmacy Resident 03/27/2015 7:46 AM

## 2015-03-27 NOTE — Progress Notes (Signed)
Pt walked the entire unit today with minimal SOB, no chest pain. Daughter at bedside

## 2015-03-27 NOTE — Plan of Care (Signed)
Problem: Education: Goal: Ability to demonstrate managment of disease process will improve Outcome: Progressing RN continues to educate pt on medication regimen

## 2015-03-27 NOTE — Progress Notes (Signed)
[facility] Progress Note   Katrina Peck  MVH:[id number]  DOB: April 30, 1945  DOA: 03/22/2015 PCP: Maryelizabeth Rowan, MD  Brief narrative: Katrina Peck is a 70 y.o. female presenting to the hospital with dyspnea. H/o sCHF with EF of 10%, PAF on Eliquis. She states she has been short of breath for 1 month.   Subjective: Up washing at the sink. She has no complaints.   Assessment/Plan: Active Problems:  systolic CHF with exacerbation - per heart failure team  PAF - changed from Eliquis to Xarelto as she refused to take Eliquis BID- now on Heparin/coumadin due to cardiac thrombus - on Amio and Dig per cardiology  Cardiac thrombus - large mural apical thrombus - found on ECHO  - now on Heparin/coumadin per cardiology   Severe anxiety -  Patient wants to avoid Benzodiazepines and I agree- have discussed relaxation techniques    Code Status:     Code Status Orders        Start     Ordered   03/22/15 2249  Full code   Continuous     03/22/15 2251     Family Communication:   Disposition Plan: per heart failure team DVT prophylaxis: Heparin Consultants: CHF team Procedures:  Antibiotics: Anti-infectives    Start     Dose/Rate Route Frequency Ordered Stop   03/24/15 1730  ceFAZolin (ANCEF) IVPB 2 g/50 mL premix     2 g 100 mL/hr over 30 Minutes Intravenous  Once 03/24/15 1716 03/24/15 1746   03/24/15 1715  ceFAZolin (ANCEF) 2-3 GM-% IVPB SOLR    Comments:  Victorino Dike   : cabinet override      03/24/15 1715 03/25/15 0529      Objective: Filed Weights   03/25/15 0352 03/26/15 0500 03/27/15 0507  Weight: 43.681 kg (96 lb 4.8 oz) 43.273 kg (95 lb 6.4 oz) 43.954 kg (96 lb 14.4 oz)    Intake/Output Summary (Last 24 hours) at 03/27/15 1049 Last data filed at 03/27/15 0850  Gross per 24 hour  Intake    480 ml  Output   2350 ml  Net  -1870 ml     Vitals Filed Vitals:   03/26/15 2010 03/27/15 0000 03/27/15 0507 03/27/15 0850  BP: 112/70 103/68 98/64  105/77  Pulse: 87 79 78 78  Temp: 98.6 F (37 C) 98.2 F (36.8 C) 97.8 F (36.6 C) 97.7 F (36.5 C)  TempSrc: Oral Oral Oral Oral  Resp: Height:      Weight:   43.954 kg (96 lb 14.4 oz)   SpO2: 100% 100% 100% 100%    Exam:  General:  Pt is alert, not in acute distress  HEENT: No icterus, No thrush, oral mucosa moist  Cardiovascular: regular rate and rhythm, S1/S2 No murmur  Respiratory: clear to auscultation bilaterally   Abdomen: Soft, +Bowel sounds, non tender, non distended, no guarding  MSK: No LE edema, cyanosis or clubbing  Data Reviewed: Basic Metabolic Panel:  Recent Labs Lab 03/23/15 0432 03/24/15 0350 03/25/15 0410 03/26/15 0515 03/27/15 0500  NA 139 139 136 137 137  K 3.7 3.7 4.7 3.8 4.0  CL 109 106 100* 101 103  CO2 20* GLUCOSE 115* 95 109* 95 103*  BUN 27* 27* 24* 28* 25*  CREATININE 1.11* 1.02* 1.28* 0.88 0.98  CALCIUM 9.0 8.5* 8.6* 8.3* 8.8*   Liver Function Tests: No results for input(s): AST, ALT, ALKPHOS, BILITOT, PROT, ALBUMIN in the  last 168 hours. No results for input(s): LIPASE, AMYLASE in the last 168 hours. No results for input(s): AMMONIA in the last 168 hours. CBC:  Recent Labs Lab 03/22/15 1717 03/24/15 0350 03/25/15 0410 03/26/15 0515 03/27/15 0500  WBC 8.2 6.9 7.2 6.1 5.3  HGB 12.4 12.1 12.9 12.3 11.4*  HCT 39.8 38.3 40.2 38.2 36.0  MCV 88.2 86.8 86.8 87.0 87.0  PLT 199 185 212 180 201   Cardiac Enzymes:  Recent Labs Lab 03/23/15 0432  TROPONINI <0.03   BNP (last 3 results)  Recent Labs  11/11/14 1345 02/03/15 2310 03/22/15 2015  BNP 3454.9* 4003.4* >4500.0*    ProBNP (last 3 results) No results for input(s): PROBNP in the last 8760 hours.  CBG: No results for input(s): GLUCAP in the last 168 hours.  Recent Results (from the past 240 hour(s))  MRSA PCR Screening     Status: None   Collection Time: 03/23/15 12:03 AM  Result Value Ref Range Status   MRSA by PCR NEGATIVE  NEGATIVE Final    Comment:        The GeneXpert MRSA Assay (FDA approved for NASAL specimens only), is one component of a comprehensive MRSA colonization surveillance program. It is not intended to diagnose MRSA infection nor to guide or monitor treatment for MRSA infections.      Studies: No results found.  Scheduled Meds:  Scheduled Meds: . amiodarone  100 mg Oral Daily  . digoxin  0.0625 mg Oral Daily  . sodium chloride  10-40 mL Intracatheter Q12H  . sodium chloride  3 mL Intravenous Q12H  . spironolactone  12.5 mg Oral Daily  . torsemide  20 mg Oral Daily  . warfarin  9 mg Oral ONCE-1800  . Warfarin - Pharmacist Dosing Inpatient   Does not apply q1800   Continuous Infusions: . heparin 900 Units/hr (03/27/15 0217)  . milrinone 0.25 mcg/kg/min (03/26/15 1708)    Time spent on care of this patient: 35 min   Preslee Regas, MD 03/27/2015, 10:49 AM  LOS: 5 days   [facility] Office  [phone] Pager - Text Page per [email or web address] If 7PM-7AM, please contact night-coverage [email or web address]

## 2015-03-27 NOTE — Progress Notes (Signed)
Patient ID: Katrina Peck, female   DOB: Aug 25, 1945, 70 y.o.   MRN: [id number]  Advanced Heart Failure Rounding Note  Primary Physician: Dr Maryelizabeth Rowan Primary Cardiologist: Dr. Shirlee Latch   Subjective:    Tunneled PICC in place.  Co-ox 75% this am on 0.25 milrinone. Breathing significantly improved on milrinone and with diuresis.  INR 1.5 today.   Objective:   Weight Range: 96 lb 14.4 oz (43.954 kg) Body mass index is 17.72 kg/(m^2).   Vital Signs:   Temp:  [97.7 F (36.5 C)-98.6 F (37 C)] 97.7 F (36.5 C) (01/01 0850) Pulse Rate:  [78-87] 78 (01/01 0850) Resp:  [15-20] 18 (01/01 0850) BP: (98-112)/(64-77) 105/77 mmHg (01/01 0850) SpO2:  [100 %] 100 % (01/01 0850) Weight:  [96 lb 14.4 oz (43.954 kg)] 96 lb 14.4 oz (43.954 kg) (01/01 0507) Last BM Date: 03/23/15  Weight change: Filed Weights   03/25/15 0352 03/26/15 0500 03/27/15 0507  Weight: 96 lb 4.8 oz (43.681 kg) 95 lb 6.4 oz (43.273 kg) 96 lb 14.4 oz (43.954 kg)    Intake/Output:   Intake/Output Summary (Last 24 hours) at 03/27/15 0919 Last data filed at 03/27/15 0850  Gross per 24 hour  Intake    600 ml  Output   2350 ml  Net  -1750 ml     Physical Exam: General: Chronically ill and elderly appearing HEENT: normal Neck: supple. JVP 5-6. Carotids 2+ bilat; no bruits. No thyromegaly or nodule noted. Cor: PMI nondisplaced. RRR. Paradoxical S2 split. Tunneled IJ R chest. Lungs: CTAB Abdomen: soft, NT, ND, no HSM. No bruits or masses. Bowel sounds present. Extremities: no cyanosis, clubbing, rash. Trace ankle edema. Neuro: alert & oriented x 3, cranial nerves grossly intact. moves all 4 extremities w/o difficulty. Affect pleasant.  Telemetry: Reviewed personally, NSR 80s, occasional PVCs  Labs: CBC  Recent Labs  03/26/15 0515 03/27/15 0500  WBC 6.1 5.3  HGB 12.3 11.4*  HCT 38.2 36.0  MCV 87.0 87.0  PLT 180 201   Basic Metabolic Panel  Recent Labs  03/26/15 0515 03/27/15 0500  NA 137 137   K 3.8 4.0  CL 101 103  CO2 27 25  GLUCOSE 95 103*  BUN 28* 25*  CREATININE 0.88 0.98  CALCIUM 8.3* 8.8*   Liver Function Tests No results for input(s): AST, ALT, ALKPHOS, BILITOT, PROT, ALBUMIN in the last 72 hours. No results for input(s): LIPASE, AMYLASE in the last 72 hours. Cardiac Enzymes No results for input(s): CKTOTAL, CKMB, CKMBINDEX, TROPONINI in the last 72 hours.  BNP: BNP (last 3 results)  Recent Labs  11/11/14 1345 02/03/15 2310 03/22/15 2015  BNP 3454.9* 4003.4* >4500.0*    ProBNP (last 3 results) No results for input(s): PROBNP in the last 8760 hours.   D-Dimer No results for input(s): DDIMER in the last 72 hours. Hemoglobin A1C No results for input(s): HGBA1C in the last 72 hours. Fasting Lipid Panel No results for input(s): CHOL, HDL, LDLCALC, TRIG, CHOLHDL, LDLDIRECT in the last 72 hours. Thyroid Function Tests No results for input(s): TSH, T4TOTAL, T3FREE, THYROIDAB in the last 72 hours.  Invalid input(s): FREET3  Other results:   Imaging/Studies:  No results found.  Latest Echo  Latest Cath   Medications:     Scheduled Medications: . amiodarone  100 mg Oral Daily  . digoxin  0.0625 mg Oral Daily  . sodium chloride  10-40 mL Intracatheter Q12H  . sodium chloride  3 mL Intravenous Q12H  . spironolactone  12.5  mg Oral Daily  . torsemide  20 mg Oral Daily  . warfarin  9 mg Oral ONCE-1800  . Warfarin - Pharmacist Dosing Inpatient   Does not apply q1800    Infusions: . heparin 900 Units/hr (03/27/15 0217)  . milrinone 0.25 mcg/kg/min (03/26/15 1708)    PRN Medications: sodium chloride, acetaminophen, ondansetron (ZOFRAN) IV, sodium chloride, sodium chloride   Assessment/Plan   Katrina Peck is a 70 y.o. female with hx of Chronic systolic HF due to NICM (EF 10-15% by Echo 02/04/15), PAF, hx of pericardial effusion s/p pericardial window 07/2014 who presented to [facility]21[facility]25 with worsening SOB. HF team consulted to assist in  management.   1. Acute on chronic systolic CHF: Low output CHF, was on home milrinone until she insisted that PICC be removed. End stage heart failure, EF 10-15%. She was admitted with acute on chronic systolic HF, now back on milrinone via RIJ tunneled catheter.  She seems to be doing ok with this line so far.  Breathing is much better.  Volume status looks good this morning.  - Continue current milrinone, will need for home.  - Continue current torsemide.  - Continue current digoxin and spironolactone. - Poor candidate for advanced therapies with significant noncompliance.  She has not been interested in LVAD when discussed in the past either.  2. Atrial fibrillation: Remains in NSR on amiodarone. - Continue coumadin with heparin bridge. Pharmacy dosing, INR 1.5 today.  3. LV thrombus: Found on Echo 03/23/15. Had only been taking Eliquis once daily despite multiple re-counseling attempts. She is now on heparin bridge to therapeutic warfarin.  Importance of INR followup after discharge stressed multiple times now. She does not think that she and her husband will be able to manage Lovenox at home, so will stay on heparin gtt here until INR > 2.   Length of Stay: 5  [name]  03/27/2015, 9:19 AM

## 2015-03-28 DIAGNOSIS — Z9114 Patient's other noncompliance with medication regimen: Secondary | ICD-10-CM

## 2015-03-28 DIAGNOSIS — E43 Unspecified severe protein-calorie malnutrition: Secondary | ICD-10-CM

## 2015-03-28 LAB — CBC
HEMATOCRIT: 36.9 % (ref 36.0–46.0)
HEMOGLOBIN: 11.9 g/dL — AB (ref 12.0–15.0)
MCH: 27.7 pg (ref 26.0–34.0)
MCHC: 32.2 g/dL (ref 30.0–36.0)
MCV: 85.8 fL (ref 78.0–100.0)
Platelets: 218 10*3/uL (ref 150–400)
RBC: 4.3 MIL/uL (ref 3.87–5.11)
RDW: 16.3 % — AB (ref 11.5–15.5)
WBC: 5.6 10*3/uL (ref 4.0–10.5)

## 2015-03-28 LAB — CARBOXYHEMOGLOBIN
Carboxyhemoglobin: 1.1 % (ref 0.5–1.5)
Methemoglobin: 0.6 % (ref 0.0–1.5)
O2 Saturation: 58.3 %
Total hemoglobin: 12.2 g/dL (ref 12.0–16.0)

## 2015-03-28 LAB — PROTIME-INR
INR: 1.99 — AB (ref 0.00–1.49)
Prothrombin Time: 22.5 seconds — ABNORMAL HIGH (ref 11.6–15.2)

## 2015-03-28 LAB — BASIC METABOLIC PANEL
ANION GAP: 8 (ref 5–15)
BUN: 21 mg/dL — ABNORMAL HIGH (ref 6–20)
CALCIUM: 9 mg/dL (ref 8.9–10.3)
CHLORIDE: 102 mmol/L (ref 101–111)
CO2: 26 mmol/L (ref 22–32)
Creatinine, Ser: 0.9 mg/dL (ref 0.44–1.00)
GFR calc non Af Amer: >60 mL/min (ref >60)
Glucose, Bld: 101 mg/dL — ABNORMAL HIGH (ref 65–99)
Potassium: 4.1 mmol/L (ref 3.5–5.1)
Sodium: 136 mmol/L (ref 135–145)

## 2015-03-28 LAB — HEPARIN LEVEL (UNFRACTIONATED): HEPARIN UNFRACTIONATED: 0.82 [IU]/mL — AB (ref 0.30–0.70)

## 2015-03-28 MED ORDER — WARFARIN SODIUM 7.5 MG PO TABS: 7.5000 mg | ORAL_TABLET | Freq: Once | ORAL | Status: DC

## 2015-03-28 MED ORDER — HEPARIN (PORCINE) IN NACL 100-0.45 UNIT/ML-% IJ SOLN: 800.0000 [IU]/h | INTRAMUSCULAR | Status: DC

## 2015-03-28 MED ORDER — TORSEMIDE 20 MG PO TABS: 20.0000 mg | ORAL_TABLET | Freq: Every day | ORAL | Status: DC

## 2015-03-28 MED ORDER — ENSURE ENLIVE PO LIQD
237.0000 mL | Freq: Two times a day (BID) | ORAL | Status: DC
Start: 2015-03-28 — End: 2015-07-20

## 2015-03-28 MED ORDER — MILRINONE IN DEXTROSE 20 MG/100ML IV SOLN: 0.2500 ug/kg/min | INTRAVENOUS | Status: DC

## 2015-03-28 MED ORDER — ENOXAPARIN SODIUM 80 MG/0.8ML ~~LOC~~ SOLN
1.5000 mg/kg | Freq: Once | SUBCUTANEOUS | Status: AC
  Administered 2015-03-28: 65 mg via SUBCUTANEOUS
  Filled 2015-03-28: qty 0.8

## 2015-03-28 MED ORDER — WARFARIN SODIUM 2 MG PO TABS: 4.0000 mg | ORAL_TABLET | Freq: Every day | ORAL | Status: DC

## 2015-03-28 NOTE — Care Management Important Message (Signed)
Important Message  Patient Details  Name: Katrina Peck MRN: [id number] Date of Birth: 1946-02-21   Medicare Important Message Given:  Yes    Gala Lewandowsky, RN 03/28/2015, 12:00 PM

## 2015-03-28 NOTE — Care Management Important Message (Signed)
Important Message  Patient Details  Name: SARALYNN TORSIELLO MRN: [id number] Date of Birth: January 16, 1946   Medicare Important Message Given:  Yes    Rayvon Char 03/28/2015, 12:30 PMImportant Message  Patient Details  Name: JIMMIA SCHEDLER MRN: [id number] Date of Birth: 01-23-1946   Medicare Important Message Given:  Yes    Jamone Garrido G 03/28/2015, 12:30 PM

## 2015-03-28 NOTE — Progress Notes (Signed)
ANTICOAGULATION CONSULT NOTE - Follow Up Consult  Pharmacy Consult for heparin/warfarin Indication: LV Thrombus  No Known Allergies  Patient Measurements: Height: 5\' 2"  (157.5 cm) Weight: 95 lb 14.4 oz (43.5 kg) IBW/kg (Calculated) : 50.1  Vital Signs: Temp: 97.8 F (36.6 C) (01/02 0844) Temp Source: Oral (01/02 0844) BP: 95/73 mmHg (01/02 1047) Pulse Rate: 82 (01/02 1049)  Labs:  Recent Labs  03/25/15 1400  03/26/15 0515  03/26/15 2055 03/27/15 0500 03/28/15 0716 03/28/15 0717  HGB  --   < > 12.3  --   --  11.4*  --  11.9*  HCT  --   --  38.2  --   --  36.0  --  36.9  PLT  --   --  180  --   --  201  --  218  APTT 49*  --  103*  --   --   --   --   --   LABPROT  --   --  18.0*  --   --  18.2* 22.5*  --   INR  --   --  1.48  --   --  1.51* 1.99*  --   HEPARINUNFRC  --   --  0.67  < > 0.60 0.48 0.82*  --   CREATININE  --   --  0.88  --   --  0.98  --  0.90  < > = values in this interval not displayed.  Estimated Creatinine Clearance: 41.1 mL/min (by C-G formula based on Cr of 0.9).   Assessment: 70 yo female with afib on apixiban PTA (non-compliant) and now noted with LV thrombus (unable to use NOACs). Pt on heparin bridge to coumadin. HL has trended up to SUPRAtherapeutic level at 0.82 on 900 units/hr. Lab drawn from opposite arm and no overt s/s bleeding. INR 1.51>1.99 after 9 mg warfarin yesterday; day #5 VKA/heparin bridge. Hgb has trended down slightly from admission but overall stable, plt remain wnl and stable.   DDI: Amiodarone   Goal of Therapy:  INR 2-3 Heparin level 0.3-0.7 units/ml Monitor platelets by anticoagulation protocol: Yes   Plan:  Decrease heparin gtt to 800 units/hr  HL in 6 hours Coumadin 7.5 mg tonight, if INR remains > 2 tomorrow can d/c heparin gtt Daily INR, HL, CBC Monitor renal function, s/sx bleeding   Ahlaya Ende K. Bonnye Fava, PharmD, BCPS, CPP Clinical Pharmacist Pager: [phone] Phone: [phone] 03/28/2015 10:54  AM

## 2015-03-28 NOTE — Discharge Summary (Addendum)
Physician Discharge Summary  Katrina Peck ZOX:[id number] DOB: 02-26-1946 DOA: 03/22/2015  PCP: Maryelizabeth Rowan, MD  Admit date: 03/22/2015 Discharge date: 03/28/2015  Time spent: 55 minutes  Recommendations for Outpatient Follow-up:  1. F/u INR  Discharge Condition: stable    Discharge Diagnoses:  Principal Problem:   Acute on chronic systolic congestive heart failure (HCC) Active Problems:   Paroxysmal atrial fibrillation (HCC)   LV (left ventricular) mural thrombus (HCC)   Non compliance w medication regimen   Protein-calorie malnutrition, severe (HCC)   History of present illness:  Katrina Peck is a 70 y.o. female presenting to the hospital with dyspnea. H/o sCHF with EF of 10%, PAF on Eliquis. She stated that she had been short of breath for 1 month.   Hospital Course:  Active Problems: Acute systolic CHF   - previously on Milrinone at home- she refused new PICC and therfore Milrinone was discontinued- she refused to f/u at the heart failure clinic as well - Tunneled cath placed this admission- Milrinone resumed- diuresed and is negative balance by 11 L  PAF - changed from Eliquis to Xarelto as she refused to take Eliquis BID - subsequently changed to Coumadin due to cardiac thrombus - on Amio and Dig per cardiology  Cardiac thrombus - large mural apical thrombus - INR 1.99 today- will d/c home on Coumadin- home health to do INR in 2 days - advised strict compliance with Coumadin to prevent CVA  Severe anxiety - Patient wants to avoid Benzodiazepines and I agree- have discussed relaxation techniques   Protein calorie malnutrition- severe - started ensure  Procedures:  Tunneled cath  ECHO  Consultations:  Heart failure team  Discharge Exam: Filed Weights   03/26/15 0500 03/27/15 0507 03/28/15 0500  Weight: 43.273 kg (95 lb 6.4 oz) 43.954 kg (96 lb 14.4 oz) 43.5 kg (95 lb 14.4 oz)   Filed Vitals:   03/28/15 0455 03/28/15 0844  BP: 102/74 91/72   Pulse: 75 78  Temp: 98 F (36.7 C) 97.8 F (36.6 C)  Resp: 20 18    General: AAO x 3, no distress Cardiovascular: RRR, no murmurs  Respiratory: clear to auscultation bilaterally GI: soft, non-tender, non-distended, bowel sound positive  Discharge Instructions You were cared for by a hospitalist during your hospital stay. If you have any questions about your discharge medications or the care you received while you were in the hospital after you are discharged, you can call the unit and asked to speak with the hospitalist on call if the hospitalist that took care of you is not available. Once you are discharged, your primary care physician will handle any further medical issues. Please note that NO REFILLS for any discharge medications will be authorized once you are discharged, as it is imperative that you return to your primary care physician (or establish a relationship with a primary care physician if you do not have one) for your aftercare needs so that they can reassess your need for medications and monitor your lab values.      Discharge Instructions    (HEART FAILURE PATIENTS) Call MD:  Anytime you have any of the following symptoms: 1) 3 pound weight gain in 24 hours or 5 pounds in 1 week 2) shortness of breath, with or without a dry hacking cough 3) swelling in the hands, feet or stomach 4) if you have to sleep on extra pillows at night in order to breathe.    Complete by:  As directed  AMB Referral to HF Clinic    Complete by:  As directed      Diet - low sodium heart healthy    Complete by:  As directed      Increase activity slowly    Complete by:  As directed             Medication List    STOP taking these medications        apixaban 5 MG Tabs tablet  Commonly known as:  ELIQUIS      TAKE these medications        amiodarone 100 MG tablet  Commonly known as:  PACERONE  Take 1 tablet (100 mg total) by mouth daily.     Digoxin 62.5 MCG Tabs  Take 0.0625 mg  by mouth daily.     milrinone 20 MG/100ML Soln infusion  Commonly known as:  PRIMACOR  Inject 11.6 mcg/min into the vein continuous.     potassium chloride SA 20 MEQ tablet  Commonly known as:  K-DUR,KLOR-CON  Take 2 tablets (40 mEq total) by mouth daily.     spironolactone 25 MG tablet  Commonly known as:  ALDACTONE  Take 0.5 tablets (12.5 mg total) by mouth daily.     torsemide 20 MG tablet  Commonly known as:  DEMADEX  Take 1 tablet (20 mg total) by mouth daily.     warfarin 2 MG tablet  Commonly known as:  COUMADIN  Take 2 tablets (4 mg total) by mouth daily.       No Known Allergies Follow-up Information    Follow up with [facility].   Why:  Registered Nurse   Contact information:   [address] [place] [address] [phone]       Follow up with [facility] On 03/31/2015.   Specialty:  Cardiology   Why:  at 0930 for coumadin check.    Contact information:   [address] [place] [address] [phone]      Follow up with [facility] On 04/04/2015.   Specialty:  Cardiology   Why:  at 1030 am for post hospital follow up. Please bring all of your medications to your visit. The code for patient parking is 1000.   Contact information:   [address] [id number] mc [place] [address] [phone]       The results of significant diagnostics from this hospitalization (including imaging, microbiology, ancillary and laboratory) are listed below for reference.    Significant Diagnostic Studies: Dg Chest 2 View  03/22/2015  CLINICAL DATA:  Chest tightness and history of hypertension. EXAM: CHEST  2 VIEW COMPARISON:  02/03/2015 FINDINGS: Midline trachea. Moderate cardiomegaly. Small right pleural effusion is similar. The left pleural effusion is resolved. No pneumothorax. No congestive failure. Worsened right base airspace  disease. IMPRESSION: Similar small right pleural effusion with increased adjacent airspace disease. Favor atelectasis. Early infection cannot be excluded. Cardiomegaly without congestive failure. Resolved left pleural effusion. Electronically Signed   By: Jeronimo Greaves M.D.   On: 03/22/2015 17:35   Ir Fluoro Guide Cv Line Right  03/24/2015  INDICATION: Acute on chronic systolic CHF. Tunneled central venous catheter needed for cardiac medicine. EXAM: FLUOROSCOPIC AND ULTRASOUND GUIDED PLACEMENT OF A TUNNELED CENTRAL VENOUS CATHETER Physician: Rachelle Hora. Henn, MD FLUOROSCOPY TIME:  54 seconds, 3 mGy MEDICATIONS: Ancef 2 g. As antibiotic prophylaxis, Ancef was ordered pre-procedure and administered  intravenously within one hour of incision. 1 mg Versed, 50 mcg fentanyl. A radiology nurse monitored the patient for moderate sedation. ANESTHESIA/SEDATION: Moderate sedation time: 20 minutes PROCEDURE: Informed consent was obtained for placement of a tunneled central venous catheter. The patient was placed supine on the interventional table. Ultrasound confirmed a patent right internal jugularvein. Ultrasound images were obtained for documentation. The right side of the neck was prepped and draped in a sterile fashion. The right side of the neck was anesthetized with 1% lidocaine. Maximal barrier sterile technique was utilized including caps, mask, sterile gowns, sterile gloves, sterile drape, hand hygiene and skin antiseptic. A small incision was made with #11 blade scalpel. A 21 gauge needle directed into the right internal jugular vein with ultrasound guidance. A micropuncture dilator set was placed. A dual lumen Powerline catheter was selected. The skin below the right clavicle was anesthetized and a small incision was made with an #11 blade scalpel. A subcutaneous tunnel was formed to the vein dermatotomy site. The catheter was brought through the tunnel. Catheter was cut to 24 cm. The vein dermatotomy site was dilated  to accommodate a peel-away sheath. The catheter was placed through the peel-away sheath and directed into the central venous structures. The tip of the catheter was placed at the superior cavoatrial junction with fluoroscopy. Fluoroscopic images were obtained for documentation. Both lumens were found to aspirate and flush well. The vein dermatotomy site was closed with Dermabond. Gel-Foam was placed in the subcutaneous tract. The catheter was secured to the skin using Prolene suture. FINDINGS: Catheter tip at the superior cavoatrial junction. Estimated blood loss: Minimal COMPLICATIONS: None IMPRESSION: Successful placement of a right jugular tunneled central venous catheter using ultrasound and fluoroscopic guidance. Electronically Signed   By: Richarda Overlie M.D.   On: 03/24/2015 18:39   Ir US Guide Vasc Access Right  03/24/2015  INDICATION: Acute on chronic systolic CHF. Tunneled central venous catheter needed for cardiac medicine. EXAM: FLUOROSCOPIC AND ULTRASOUND GUIDED PLACEMENT OF A TUNNELED CENTRAL VENOUS CATHETER Physician: Rachelle Hora. Henn, MD FLUOROSCOPY TIME:  54 seconds, 3 mGy MEDICATIONS: Ancef 2 g. As antibiotic prophylaxis, Ancef was ordered pre-procedure and administered intravenously within one hour of incision. 1 mg Versed, 50 mcg fentanyl. A radiology nurse monitored the patient for moderate sedation. ANESTHESIA/SEDATION: Moderate sedation time: 20 minutes PROCEDURE: Informed consent was obtained for placement of a tunneled central venous catheter. The patient was placed supine on the interventional table. Ultrasound confirmed a patent right internal jugularvein. Ultrasound images were obtained for documentation. The right side of the neck was prepped and draped in a sterile fashion. The right side of the neck was anesthetized with 1% lidocaine. Maximal barrier sterile technique was utilized including caps, mask, sterile gowns, sterile gloves, sterile drape, hand hygiene and skin antiseptic. A small  incision was made with #11 blade scalpel. A 21 gauge needle directed into the right internal jugular vein with ultrasound guidance. A micropuncture dilator set was placed. A dual lumen Powerline catheter was selected. The skin below the right clavicle was anesthetized and a small incision was made with an #11 blade scalpel. A subcutaneous tunnel was formed to the vein dermatotomy site. The catheter was brought through the tunnel. Catheter was cut to 24 cm. The vein dermatotomy site was dilated to accommodate a peel-away sheath. The catheter was placed through the peel-away sheath and directed into the central venous structures. The tip of the catheter was placed at the superior cavoatrial junction with fluoroscopy. Fluoroscopic images  were obtained for documentation. Both lumens were found to aspirate and flush well. The vein dermatotomy site was closed with Dermabond. Gel-Foam was placed in the subcutaneous tract. The catheter was secured to the skin using Prolene suture. FINDINGS: Catheter tip at the superior cavoatrial junction. Estimated blood loss: Minimal COMPLICATIONS: None IMPRESSION: Successful placement of a right jugular tunneled central venous catheter using ultrasound and fluoroscopic guidance. Electronically Signed   By: Richarda Overlie M.D.   On: 03/24/2015 18:39    Microbiology: Recent Results (from the past 240 hour(s))  MRSA PCR Screening     Status: None   Collection Time: 03/23/15 12:03 AM  Result Value Ref Range Status   MRSA by PCR NEGATIVE NEGATIVE Final    Comment:        The GeneXpert MRSA Assay (FDA approved for NASAL specimens only), is one component of a comprehensive MRSA colonization surveillance program. It is not intended to diagnose MRSA infection nor to guide or monitor treatment for MRSA infections.      Labs: Basic Metabolic Panel:  Recent Labs Lab 03/24/15 0350 03/25/15 0410 03/26/15 0515 03/27/15 0500 03/28/15 0717  NA 139 136 137 137 136  K 3.7 4.7  3.8 4.0 4.1  CL 106 100* 101 103 102  CO2 26 28 27 25 26   GLUCOSE 95 109* 95 103* 101*  BUN 27* 24* 28* 25* 21*  CREATININE 1.02* 1.28* 0.88 0.98 0.90  CALCIUM 8.5* 8.6* 8.3* 8.8* 9.0   Liver Function Tests: No results for input(s): AST, ALT, ALKPHOS, BILITOT, PROT, ALBUMIN in the last 168 hours. No results for input(s): LIPASE, AMYLASE in the last 168 hours. No results for input(s): AMMONIA in the last 168 hours. CBC:  Recent Labs Lab 03/24/15 0350 03/25/15 0410 03/26/15 0515 03/27/15 0500 03/28/15 0717  WBC 6.9 7.2 6.1 5.3 5.6  HGB 12.1 12.9 12.3 11.4* 11.9*  HCT 38.3 40.2 38.2 36.0 36.9  MCV 86.8 86.8 87.0 87.0 85.8  PLT 185 212 180 201 218   Cardiac Enzymes:  Recent Labs Lab 03/23/15 0432  TROPONINI <0.03   BNP: BNP (last 3 results)  Recent Labs  11/11/14 1345 02/03/15 2310 03/22/15 2015  BNP 3454.9* 4003.4* >4500.0*    ProBNP (last 3 results) No results for input(s): PROBNP in the last 8760 hours.  CBG: No results for input(s): GLUCAP in the last 168 hours.     SignedCalvert Cantor, MD [facility] 03/28/2015, 10:44 AM

## 2015-03-28 NOTE — Progress Notes (Signed)
Per report, patient and RN walked from 3 west to 3 east and back.

## 2015-03-28 NOTE — Progress Notes (Signed)
Patient ID: Katrina Peck, female   DOB: 10/02/1945, 70 y.o.   MRN: [id number]  Advanced Heart Failure Rounding Note  Primary Physician: Dr Maryelizabeth Rowan Primary Cardiologist: Dr. Shirlee Latch   Subjective:    Tunneled PICC in place.  Co-ox a little lower at 58% this am on 0.25 milrinone. Breathing significantly improved on milrinone and with diuresis.  INR 1.99 today.   Objective:   Weight Range: 95 lb 14.4 oz (43.5 kg) Body mass index is 17.54 kg/(m^2).   Vital Signs:   Temp:  [97.8 F (36.6 C)-98.2 F (36.8 C)] 97.8 F (36.6 C) (01/02 0844) Pulse Rate:  [75-90] 78 (01/02 0844) Resp:  [16-20] 18 (01/02 0844) BP: (91-125)/(67-99) 91/72 mmHg (01/02 0844) SpO2:  [100 %] 100 % (01/02 0844) Weight:  [95 lb 14.4 oz (43.5 kg)] 95 lb 14.4 oz (43.5 kg) (01/02 0500) Last BM Date: 03/23/15  Weight change: Filed Weights   03/26/15 0500 03/27/15 0507 03/28/15 0500  Weight: 95 lb 6.4 oz (43.273 kg) 96 lb 14.4 oz (43.954 kg) 95 lb 14.4 oz (43.5 kg)    Intake/Output:   Intake/Output Summary (Last 24 hours) at 03/28/15 1025 Last data filed at 03/28/15 0521  Gross per 24 hour  Intake    766 ml  Output   2700 ml  Net  -1934 ml     Physical Exam: General: Chronically ill and elderly appearing HEENT: normal Neck: supple. JVP 5-6. Carotids 2+ bilat; no bruits. No thyromegaly or nodule noted. Cor: PMI nondisplaced. RRR. Paradoxical S2 split. Tunneled IJ R chest. Lungs: CTAB Abdomen: soft, NT, ND, no HSM. No bruits or masses. Bowel sounds present. Extremities: no cyanosis, clubbing, rash. Trace ankle edema. Neuro: alert & oriented x 3, cranial nerves grossly intact. moves all 4 extremities w/o difficulty. Affect pleasant.  Telemetry: Reviewed personally, NSR 80s, occasional PVCs  Labs: CBC  Recent Labs  03/27/15 0500 03/28/15 0717  WBC 5.3 5.6  HGB 11.4* 11.9*  HCT 36.0 36.9  MCV 87.0 85.8  PLT 201 218   Basic Metabolic Panel  Recent Labs  03/27/15 0500 03/28/15 0717   NA 137 136  K 4.0 4.1  CL 103 102  CO2 25 26  GLUCOSE 103* 101*  BUN 25* 21*  CREATININE 0.98 0.90  CALCIUM 8.8* 9.0   Liver Function Tests No results for input(s): AST, ALT, ALKPHOS, BILITOT, PROT, ALBUMIN in the last 72 hours. No results for input(s): LIPASE, AMYLASE in the last 72 hours. Cardiac Enzymes No results for input(s): CKTOTAL, CKMB, CKMBINDEX, TROPONINI in the last 72 hours.  BNP: BNP (last 3 results)  Recent Labs  11/11/14 1345 02/03/15 2310 03/22/15 2015  BNP 3454.9* 4003.4* >4500.0*    ProBNP (last 3 results) No results for input(s): PROBNP in the last 8760 hours.   D-Dimer No results for input(s): DDIMER in the last 72 hours. Hemoglobin A1C No results for input(s): HGBA1C in the last 72 hours. Fasting Lipid Panel No results for input(s): CHOL, HDL, LDLCALC, TRIG, CHOLHDL, LDLDIRECT in the last 72 hours. Thyroid Function Tests No results for input(s): TSH, T4TOTAL, T3FREE, THYROIDAB in the last 72 hours.  Invalid input(s): FREET3  Other results:   Imaging/Studies:  No results found.  Latest Echo  Latest Cath   Medications:     Scheduled Medications: . amiodarone  100 mg Oral Daily  . digoxin  0.0625 mg Oral Daily  . sodium chloride  10-40 mL Intracatheter Q12H  . sodium chloride  3 mL Intravenous Q12H  .  spironolactone  12.5 mg Oral Daily  . torsemide  20 mg Oral Daily  . Warfarin - Pharmacist Dosing Inpatient   Does not apply q1800    Infusions: . heparin 800 Units/hr (03/28/15 0830)  . milrinone 0.25 mcg/kg/min (03/28/15 0000)    PRN Medications: sodium chloride, acetaminophen, ondansetron (ZOFRAN) IV, sodium chloride, sodium chloride   Assessment/Plan   Katrina Peck is a 70 y.o. female with hx of Chronic systolic HF due to NICM (EF 10-15% by Echo 02/04/15), PAF, hx of pericardial effusion s/p pericardial window 07/2014 who presented to [facility] 03/21/25 with worsening SOB. HF team consulted to assist in management.   1.  Acute on chronic systolic CHF: Low output CHF, was on home milrinone until she insisted that PICC be removed. End stage heart failure, EF 10-15%. She was admitted with acute on chronic systolic HF, now back on milrinone via RIJ tunneled catheter.  She seems to be doing ok with this line so far.  Breathing is much better.  Volume status looks good this morning.  - Continue current milrinone, will need for home.  Repeat co-ox in am.  - Continue current torsemide.  - Continue current digoxin and spironolactone. - Poor candidate for advanced therapies with significant noncompliance.  She has not been interested in LVAD when discussed in the past either.  2. Atrial fibrillation: Remains in NSR on amiodarone. - Continue coumadin with heparin bridge. If INR > 2 tomorrow (should be), will stop heparin and let her go home.   3. LV thrombus: Found on Echo 03/23/15. Had only been taking Eliquis once daily despite multiple re-counseling attempts. She is now on heparin bridge to therapeutic warfarin.  Importance of INR followup after discharge stressed multiple times now. She does not think that she and her husband will be able to manage Lovenox at home, so will stay on heparin gtt here until INR > 2.  INR 1.99 today, will overlap heparin 1 more day and home tomorrow (assuming INR > 2).   Length of Stay: 6  [name]  03/28/2015, 10:25 AM

## 2015-03-28 NOTE — Progress Notes (Signed)
ANTICOAGULATION CONSULT NOTE - Follow Up Consult  Pharmacy Consult : Discontinue IV heparin and give one dose of Lovenox 1.5 mg/kg.  Indication: LV Thrombus   No Known Allergies  Patient Measurements: Height: 5\' 2"  (157.5 cm) Weight: 95 lb 14.4 oz (43.5 kg) IBW/kg (Calculated) : 50.1   Vital Signs: Temp: 97.8 F (36.6 C) (01/02 0844) Temp Source: Oral (01/02 0844) BP: 95/73 mmHg (01/02 1047) Pulse Rate: 82 (01/02 1049)  Labs:  Recent Labs  03/25/15 1400  03/26/15 0515  03/26/15 2055 03/27/15 0500 03/28/15 0716 03/28/15 0717  HGB  --   < > 12.3  --   --  11.4*  --  11.9*  HCT  --   --  38.2  --   --  36.0  --  36.9  PLT  --   --  180  --   --  201  --  218  APTT 49*  --  103*  --   --   --   --   --   LABPROT  --   --  18.0*  --   --  18.2* 22.5*  --   INR  --   --  1.48  --   --  1.51* 1.99*  --   HEPARINUNFRC  --   --  0.67  < > 0.60 0.48 0.82*  --   CREATININE  --   --  0.88  --   --  0.98  --  0.90  < > = values in this interval not displayed.  Estimated Creatinine Clearance: 41.1 mL/min (by C-G formula based on Cr of 0.9).   Medications:  Scheduled:  . amiodarone  100 mg Oral Daily  . digoxin  0.0625 mg Oral Daily  . enoxaparin (LOVENOX) injection  1.5 mg/kg Subcutaneous Once  . sodium chloride  10-40 mL Intracatheter Q12H  . sodium chloride  3 mL Intravenous Q12H  . spironolactone  12.5 mg Oral Daily  . torsemide  20 mg Oral Daily  . warfarin  7.5 mg Oral ONCE-1800  . Warfarin - Pharmacist Dosing Inpatient   Does not apply q1800    Assessment: 70 yo female with afib on apixiban PTA (non-compliant) and now noted with LV thrombus (unable to use NOACs). Pt on heparin bridge to coumadin. HL has trended up to SUPRAtherapeutic level at 0.82 on 900 units/hr. Lab drawn from opposite arm and no overt s/s bleeding. INR 1.51>1.99 after 9 mg warfarin yesterday; day #5 VKA/heparin bridge. Hgb has trended down slightly from admission but overall stable, plt remain wnl  and stable.   DDI: Amiodarone  Dr. Butler Denmark has discontinue the IV heparin drip and plans to discharge the patient today on coumadin 4mg  daily.  Day#5 overlap heparin/coumadin. I called Dr. Butler Denmark, discussed that patient need to continue IV heparin overlap until INR =/>2 for 1 more day for treatment of acute LV thrombus..  Dr. Butler Denmark is aware of this. She gave order to give patient one lovenox SQ injection 1.5mg /kg x1 prior to discharge today. I recommended to check INR tomorrow.  MD will discharge patient today and Home Health will check INR on 03/29/14 per Dr. Butler Denmark.    Goal of Therapy:  INR 2-3 Monitor platelets by anticoagulation protocol: Yes   Plan:  DC IV heparin drip, then 1 hr after this give Lovenox 65 mg SQ x1.  Coumadin dosing as instructed/prescribed by Dr. Butler Denmark.  HHRN to check INR on 1/4, results to be sent to Dr. Shirlee Latch.  Noah Delaine, RPh Clinical Pharmacist Pager: [phone] 03/28/2015,12:44 PM

## 2015-03-28 NOTE — Discharge Instructions (Signed)

## 2015-03-29 DIAGNOSIS — I110 Hypertensive heart disease with heart failure: Secondary | ICD-10-CM | POA: Diagnosis not present

## 2015-03-29 DIAGNOSIS — D649 Anemia, unspecified: Secondary | ICD-10-CM | POA: Diagnosis not present

## 2015-03-29 DIAGNOSIS — I480 Paroxysmal atrial fibrillation: Secondary | ICD-10-CM | POA: Diagnosis not present

## 2015-03-29 DIAGNOSIS — I428 Other cardiomyopathies: Secondary | ICD-10-CM | POA: Diagnosis not present

## 2015-03-29 DIAGNOSIS — I5042 Chronic combined systolic (congestive) and diastolic (congestive) heart failure: Secondary | ICD-10-CM | POA: Diagnosis not present

## 2015-03-29 DIAGNOSIS — I214 Non-ST elevation (NSTEMI) myocardial infarction: Secondary | ICD-10-CM | POA: Diagnosis not present

## 2015-03-31 ENCOUNTER — Ambulatory Visit (INDEPENDENT_AMBULATORY_CARE_PROVIDER_SITE_OTHER): Payer: Medicare Other | Admitting: Cardiovascular Disease

## 2015-03-31 DIAGNOSIS — I5042 Chronic combined systolic (congestive) and diastolic (congestive) heart failure: Secondary | ICD-10-CM | POA: Diagnosis not present

## 2015-03-31 DIAGNOSIS — I4891 Unspecified atrial fibrillation: Secondary | ICD-10-CM | POA: Insufficient documentation

## 2015-03-31 DIAGNOSIS — I480 Paroxysmal atrial fibrillation: Secondary | ICD-10-CM | POA: Diagnosis not present

## 2015-03-31 DIAGNOSIS — Z5181 Encounter for therapeutic drug level monitoring: Secondary | ICD-10-CM

## 2015-03-31 DIAGNOSIS — I513 Intracardiac thrombosis, not elsewhere classified: Secondary | ICD-10-CM

## 2015-03-31 DIAGNOSIS — Z7901 Long term (current) use of anticoagulants: Secondary | ICD-10-CM

## 2015-03-31 DIAGNOSIS — D649 Anemia, unspecified: Secondary | ICD-10-CM | POA: Diagnosis not present

## 2015-03-31 DIAGNOSIS — I428 Other cardiomyopathies: Secondary | ICD-10-CM | POA: Diagnosis not present

## 2015-03-31 DIAGNOSIS — I110 Hypertensive heart disease with heart failure: Secondary | ICD-10-CM | POA: Diagnosis not present

## 2015-03-31 DIAGNOSIS — I214 Non-ST elevation (NSTEMI) myocardial infarction: Secondary | ICD-10-CM | POA: Diagnosis not present

## 2015-03-31 DIAGNOSIS — I213 ST elevation (STEMI) myocardial infarction of unspecified site: Secondary | ICD-10-CM

## 2015-03-31 LAB — POCT INR: INR: 1.8

## 2015-04-04 ENCOUNTER — Encounter (HOSPITAL_COMMUNITY): Payer: Medicare Other

## 2015-04-04 DIAGNOSIS — I214 Non-ST elevation (NSTEMI) myocardial infarction: Secondary | ICD-10-CM | POA: Diagnosis not present

## 2015-04-04 DIAGNOSIS — I5042 Chronic combined systolic (congestive) and diastolic (congestive) heart failure: Secondary | ICD-10-CM | POA: Diagnosis not present

## 2015-04-04 DIAGNOSIS — I428 Other cardiomyopathies: Secondary | ICD-10-CM | POA: Diagnosis not present

## 2015-04-04 DIAGNOSIS — I480 Paroxysmal atrial fibrillation: Secondary | ICD-10-CM | POA: Diagnosis not present

## 2015-04-04 DIAGNOSIS — I110 Hypertensive heart disease with heart failure: Secondary | ICD-10-CM | POA: Diagnosis not present

## 2015-04-04 DIAGNOSIS — D649 Anemia, unspecified: Secondary | ICD-10-CM | POA: Diagnosis not present

## 2015-04-07 ENCOUNTER — Ambulatory Visit (INDEPENDENT_AMBULATORY_CARE_PROVIDER_SITE_OTHER): Payer: Medicare Other | Admitting: Cardiovascular Disease

## 2015-04-07 DIAGNOSIS — I480 Paroxysmal atrial fibrillation: Secondary | ICD-10-CM | POA: Diagnosis not present

## 2015-04-07 DIAGNOSIS — I110 Hypertensive heart disease with heart failure: Secondary | ICD-10-CM | POA: Diagnosis not present

## 2015-04-07 DIAGNOSIS — I513 Intracardiac thrombosis, not elsewhere classified: Secondary | ICD-10-CM

## 2015-04-07 DIAGNOSIS — Z5181 Encounter for therapeutic drug level monitoring: Secondary | ICD-10-CM

## 2015-04-07 DIAGNOSIS — I5042 Chronic combined systolic (congestive) and diastolic (congestive) heart failure: Secondary | ICD-10-CM | POA: Diagnosis not present

## 2015-04-07 DIAGNOSIS — Z7901 Long term (current) use of anticoagulants: Secondary | ICD-10-CM

## 2015-04-07 DIAGNOSIS — I214 Non-ST elevation (NSTEMI) myocardial infarction: Secondary | ICD-10-CM | POA: Diagnosis not present

## 2015-04-07 DIAGNOSIS — I213 ST elevation (STEMI) myocardial infarction of unspecified site: Secondary | ICD-10-CM

## 2015-04-07 DIAGNOSIS — I428 Other cardiomyopathies: Secondary | ICD-10-CM | POA: Diagnosis not present

## 2015-04-07 DIAGNOSIS — D649 Anemia, unspecified: Secondary | ICD-10-CM | POA: Diagnosis not present

## 2015-04-07 LAB — POCT INR: INR: 2

## 2015-04-08 ENCOUNTER — Other Ambulatory Visit (HOSPITAL_COMMUNITY): Payer: Self-pay | Admitting: Cardiology

## 2015-04-11 DIAGNOSIS — I5042 Chronic combined systolic (congestive) and diastolic (congestive) heart failure: Secondary | ICD-10-CM | POA: Diagnosis not present

## 2015-04-11 DIAGNOSIS — Z7901 Long term (current) use of anticoagulants: Secondary | ICD-10-CM | POA: Diagnosis not present

## 2015-04-11 DIAGNOSIS — I214 Non-ST elevation (NSTEMI) myocardial infarction: Secondary | ICD-10-CM | POA: Diagnosis not present

## 2015-04-11 DIAGNOSIS — I10 Essential (primary) hypertension: Secondary | ICD-10-CM | POA: Diagnosis not present

## 2015-04-11 DIAGNOSIS — Z452 Encounter for adjustment and management of vascular access device: Secondary | ICD-10-CM | POA: Diagnosis not present

## 2015-04-11 DIAGNOSIS — I428 Other cardiomyopathies: Secondary | ICD-10-CM | POA: Diagnosis not present

## 2015-04-11 DIAGNOSIS — I480 Paroxysmal atrial fibrillation: Secondary | ICD-10-CM | POA: Diagnosis not present

## 2015-04-11 DIAGNOSIS — I110 Hypertensive heart disease with heart failure: Secondary | ICD-10-CM | POA: Diagnosis not present

## 2015-04-11 DIAGNOSIS — D649 Anemia, unspecified: Secondary | ICD-10-CM | POA: Diagnosis not present

## 2015-04-12 ENCOUNTER — Telehealth (HOSPITAL_COMMUNITY): Payer: Self-pay | Admitting: Cardiology

## 2015-04-12 MED ORDER — MAGNESIUM OXIDE -MG SUPPLEMENT 200 MG PO TABS: 200.0000 mg | ORAL_TABLET | Freq: Every day | ORAL | Status: DC

## 2015-04-12 NOTE — Telephone Encounter (Signed)
ABNORMAL LABS RECEIVED FROM [facility] LABS DRAWN 04/11/15 MAGNESIUM 1.7 PER VO AMY CLEGG, NP PATIENT SHOULD START MAG-OX 200 MG ,ONE DAILY  PT AWARE AND VOICED UNDERSTANDING, RX SENT TO PHARMACY

## 2015-04-14 ENCOUNTER — Ambulatory Visit (INDEPENDENT_AMBULATORY_CARE_PROVIDER_SITE_OTHER): Payer: Medicare Other | Admitting: Cardiovascular Disease

## 2015-04-14 DIAGNOSIS — I213 ST elevation (STEMI) myocardial infarction of unspecified site: Secondary | ICD-10-CM

## 2015-04-14 DIAGNOSIS — I110 Hypertensive heart disease with heart failure: Secondary | ICD-10-CM | POA: Diagnosis not present

## 2015-04-14 DIAGNOSIS — I480 Paroxysmal atrial fibrillation: Secondary | ICD-10-CM | POA: Diagnosis not present

## 2015-04-14 DIAGNOSIS — Z7901 Long term (current) use of anticoagulants: Secondary | ICD-10-CM

## 2015-04-14 DIAGNOSIS — I214 Non-ST elevation (NSTEMI) myocardial infarction: Secondary | ICD-10-CM | POA: Diagnosis not present

## 2015-04-14 DIAGNOSIS — I4891 Unspecified atrial fibrillation: Secondary | ICD-10-CM

## 2015-04-14 DIAGNOSIS — Z5181 Encounter for therapeutic drug level monitoring: Secondary | ICD-10-CM

## 2015-04-14 DIAGNOSIS — I5042 Chronic combined systolic (congestive) and diastolic (congestive) heart failure: Secondary | ICD-10-CM | POA: Diagnosis not present

## 2015-04-14 DIAGNOSIS — I513 Intracardiac thrombosis, not elsewhere classified: Secondary | ICD-10-CM

## 2015-04-14 DIAGNOSIS — D649 Anemia, unspecified: Secondary | ICD-10-CM | POA: Diagnosis not present

## 2015-04-14 DIAGNOSIS — I428 Other cardiomyopathies: Secondary | ICD-10-CM | POA: Diagnosis not present

## 2015-04-14 LAB — POCT INR: INR: 2.3

## 2015-04-15 ENCOUNTER — Other Ambulatory Visit (HOSPITAL_COMMUNITY): Payer: Self-pay | Admitting: Cardiology

## 2015-04-18 DIAGNOSIS — I428 Other cardiomyopathies: Secondary | ICD-10-CM | POA: Diagnosis not present

## 2015-04-18 DIAGNOSIS — I214 Non-ST elevation (NSTEMI) myocardial infarction: Secondary | ICD-10-CM | POA: Diagnosis not present

## 2015-04-18 DIAGNOSIS — I480 Paroxysmal atrial fibrillation: Secondary | ICD-10-CM | POA: Diagnosis not present

## 2015-04-18 DIAGNOSIS — I5042 Chronic combined systolic (congestive) and diastolic (congestive) heart failure: Secondary | ICD-10-CM | POA: Diagnosis not present

## 2015-04-18 DIAGNOSIS — D649 Anemia, unspecified: Secondary | ICD-10-CM | POA: Diagnosis not present

## 2015-04-18 DIAGNOSIS — I110 Hypertensive heart disease with heart failure: Secondary | ICD-10-CM | POA: Diagnosis not present

## 2015-04-21 ENCOUNTER — Ambulatory Visit (INDEPENDENT_AMBULATORY_CARE_PROVIDER_SITE_OTHER): Payer: Medicare Other | Admitting: Internal Medicine

## 2015-04-21 DIAGNOSIS — Z5181 Encounter for therapeutic drug level monitoring: Secondary | ICD-10-CM

## 2015-04-21 DIAGNOSIS — I480 Paroxysmal atrial fibrillation: Secondary | ICD-10-CM

## 2015-04-21 DIAGNOSIS — I513 Intracardiac thrombosis, not elsewhere classified: Secondary | ICD-10-CM

## 2015-04-21 DIAGNOSIS — D649 Anemia, unspecified: Secondary | ICD-10-CM | POA: Diagnosis not present

## 2015-04-21 DIAGNOSIS — I428 Other cardiomyopathies: Secondary | ICD-10-CM | POA: Diagnosis not present

## 2015-04-21 DIAGNOSIS — Z7901 Long term (current) use of anticoagulants: Secondary | ICD-10-CM

## 2015-04-21 DIAGNOSIS — I213 ST elevation (STEMI) myocardial infarction of unspecified site: Secondary | ICD-10-CM

## 2015-04-21 DIAGNOSIS — I5042 Chronic combined systolic (congestive) and diastolic (congestive) heart failure: Secondary | ICD-10-CM | POA: Diagnosis not present

## 2015-04-21 DIAGNOSIS — I110 Hypertensive heart disease with heart failure: Secondary | ICD-10-CM | POA: Diagnosis not present

## 2015-04-21 DIAGNOSIS — I214 Non-ST elevation (NSTEMI) myocardial infarction: Secondary | ICD-10-CM | POA: Diagnosis not present

## 2015-04-21 DIAGNOSIS — I4891 Unspecified atrial fibrillation: Secondary | ICD-10-CM

## 2015-04-21 LAB — POCT INR: INR: 2.7

## 2015-04-22 ENCOUNTER — Other Ambulatory Visit (HOSPITAL_COMMUNITY): Payer: Self-pay | Admitting: Cardiology

## 2015-04-25 DIAGNOSIS — I110 Hypertensive heart disease with heart failure: Secondary | ICD-10-CM | POA: Diagnosis not present

## 2015-04-25 DIAGNOSIS — I214 Non-ST elevation (NSTEMI) myocardial infarction: Secondary | ICD-10-CM | POA: Diagnosis not present

## 2015-04-25 DIAGNOSIS — I480 Paroxysmal atrial fibrillation: Secondary | ICD-10-CM | POA: Diagnosis not present

## 2015-04-25 DIAGNOSIS — I5042 Chronic combined systolic (congestive) and diastolic (congestive) heart failure: Secondary | ICD-10-CM | POA: Diagnosis not present

## 2015-04-25 DIAGNOSIS — I428 Other cardiomyopathies: Secondary | ICD-10-CM | POA: Diagnosis not present

## 2015-04-25 DIAGNOSIS — D649 Anemia, unspecified: Secondary | ICD-10-CM | POA: Diagnosis not present

## 2015-04-26 ENCOUNTER — Telehealth: Payer: Self-pay

## 2015-04-26 ENCOUNTER — Telehealth (HOSPITAL_COMMUNITY): Payer: Self-pay

## 2015-04-26 MED ORDER — MAGNESIUM OXIDE -MG SUPPLEMENT 200 MG PO TABS: 200.0000 mg | ORAL_TABLET | Freq: Two times a day (BID) | ORAL | Status: DC

## 2015-04-26 MED ORDER — WARFARIN SODIUM 2 MG PO TABS: ORAL_TABLET | ORAL | Status: DC

## 2015-04-26 NOTE — Telephone Encounter (Signed)
-----   Message from Noralee Space, RN sent at 04/26/2015  4:37 PM EST ----- Pt needs refills on coumadin please, i wasn't sure if she had been compliant or not ----- Message -----    From: Laurey Morale, MD    Sent: 04/26/2015   4:21 PM      To: Noralee Space, RN, Theresia Bough, CMA  Running out of coumadin, please refill.

## 2015-04-26 NOTE — Telephone Encounter (Signed)
Patient instructed to increase magnesium 200 mg once a day to twice a day as per Arna Snipe  Patient repeated instructions correctly

## 2015-04-28 ENCOUNTER — Ambulatory Visit (INDEPENDENT_AMBULATORY_CARE_PROVIDER_SITE_OTHER): Payer: Medicare Other | Admitting: Cardiology

## 2015-04-28 DIAGNOSIS — I213 ST elevation (STEMI) myocardial infarction of unspecified site: Secondary | ICD-10-CM

## 2015-04-28 DIAGNOSIS — I428 Other cardiomyopathies: Secondary | ICD-10-CM | POA: Diagnosis not present

## 2015-04-28 DIAGNOSIS — Z7901 Long term (current) use of anticoagulants: Secondary | ICD-10-CM

## 2015-04-28 DIAGNOSIS — I4891 Unspecified atrial fibrillation: Secondary | ICD-10-CM

## 2015-04-28 DIAGNOSIS — I214 Non-ST elevation (NSTEMI) myocardial infarction: Secondary | ICD-10-CM | POA: Diagnosis not present

## 2015-04-28 DIAGNOSIS — I480 Paroxysmal atrial fibrillation: Secondary | ICD-10-CM

## 2015-04-28 DIAGNOSIS — I513 Intracardiac thrombosis, not elsewhere classified: Secondary | ICD-10-CM

## 2015-04-28 DIAGNOSIS — Z5181 Encounter for therapeutic drug level monitoring: Secondary | ICD-10-CM

## 2015-04-28 DIAGNOSIS — I5042 Chronic combined systolic (congestive) and diastolic (congestive) heart failure: Secondary | ICD-10-CM | POA: Diagnosis not present

## 2015-04-28 DIAGNOSIS — D649 Anemia, unspecified: Secondary | ICD-10-CM | POA: Diagnosis not present

## 2015-04-28 DIAGNOSIS — I110 Hypertensive heart disease with heart failure: Secondary | ICD-10-CM | POA: Diagnosis not present

## 2015-04-28 LAB — POCT INR: INR: 1.5

## 2015-05-02 DIAGNOSIS — I110 Hypertensive heart disease with heart failure: Secondary | ICD-10-CM | POA: Diagnosis not present

## 2015-05-02 DIAGNOSIS — I513 Intracardiac thrombosis, not elsewhere classified: Secondary | ICD-10-CM

## 2015-05-02 DIAGNOSIS — I214 Non-ST elevation (NSTEMI) myocardial infarction: Secondary | ICD-10-CM | POA: Diagnosis not present

## 2015-05-02 DIAGNOSIS — I428 Other cardiomyopathies: Secondary | ICD-10-CM | POA: Diagnosis not present

## 2015-05-02 DIAGNOSIS — I480 Paroxysmal atrial fibrillation: Secondary | ICD-10-CM | POA: Diagnosis not present

## 2015-05-02 DIAGNOSIS — Z7901 Long term (current) use of anticoagulants: Secondary | ICD-10-CM

## 2015-05-02 DIAGNOSIS — D649 Anemia, unspecified: Secondary | ICD-10-CM | POA: Diagnosis not present

## 2015-05-02 DIAGNOSIS — I4891 Unspecified atrial fibrillation: Secondary | ICD-10-CM

## 2015-05-02 DIAGNOSIS — I5042 Chronic combined systolic (congestive) and diastolic (congestive) heart failure: Secondary | ICD-10-CM | POA: Diagnosis not present

## 2015-05-02 DIAGNOSIS — Z5181 Encounter for therapeutic drug level monitoring: Secondary | ICD-10-CM

## 2015-05-02 NOTE — Progress Notes (Signed)
This encounter was created in error - please disregard.

## 2015-05-05 ENCOUNTER — Ambulatory Visit (HOSPITAL_COMMUNITY)
Admission: RE | Admit: 2015-05-05 | Discharge: 2015-05-05 | Disposition: A | Discharge status: Home / Self Care | Payer: Medicare Other | Source: Ambulatory Visit | Attending: Cardiology | Admitting: Cardiology

## 2015-05-05 ENCOUNTER — Ambulatory Visit (INDEPENDENT_AMBULATORY_CARE_PROVIDER_SITE_OTHER): Payer: Medicare Other | Admitting: *Deleted

## 2015-05-05 VITALS — BP 113/80 | HR 93 | Ht 62.0 in | Wt 103.0 lb

## 2015-05-05 DIAGNOSIS — I480 Paroxysmal atrial fibrillation: Secondary | ICD-10-CM

## 2015-05-05 DIAGNOSIS — I5022 Chronic systolic (congestive) heart failure: Secondary | ICD-10-CM | POA: Diagnosis not present

## 2015-05-05 DIAGNOSIS — I5021 Acute systolic (congestive) heart failure: Secondary | ICD-10-CM | POA: Diagnosis not present

## 2015-05-05 DIAGNOSIS — Z86718 Personal history of other venous thrombosis and embolism: Secondary | ICD-10-CM | POA: Insufficient documentation

## 2015-05-05 DIAGNOSIS — Z7901 Long term (current) use of anticoagulants: Secondary | ICD-10-CM | POA: Diagnosis not present

## 2015-05-05 DIAGNOSIS — I513 Intracardiac thrombosis, not elsewhere classified: Secondary | ICD-10-CM

## 2015-05-05 DIAGNOSIS — Z5181 Encounter for therapeutic drug level monitoring: Secondary | ICD-10-CM | POA: Diagnosis not present

## 2015-05-05 DIAGNOSIS — I447 Left bundle-branch block, unspecified: Secondary | ICD-10-CM | POA: Insufficient documentation

## 2015-05-05 DIAGNOSIS — I213 ST elevation (STEMI) myocardial infarction of unspecified site: Secondary | ICD-10-CM | POA: Diagnosis not present

## 2015-05-05 DIAGNOSIS — I428 Other cardiomyopathies: Secondary | ICD-10-CM | POA: Diagnosis not present

## 2015-05-05 DIAGNOSIS — I110 Hypertensive heart disease with heart failure: Secondary | ICD-10-CM | POA: Insufficient documentation

## 2015-05-05 DIAGNOSIS — Z79899 Other long term (current) drug therapy: Secondary | ICD-10-CM | POA: Diagnosis not present

## 2015-05-05 DIAGNOSIS — I4891 Unspecified atrial fibrillation: Secondary | ICD-10-CM | POA: Diagnosis not present

## 2015-05-05 LAB — COMPREHENSIVE METABOLIC PANEL
ALBUMIN: 3.3 g/dL — AB (ref 3.5–5.0)
ALK PHOS: 97 U/L (ref 38–126)
ALT: 25 U/L (ref 14–54)
AST: 31 U/L (ref 15–41)
Anion gap: 11 (ref 5–15)
BILIRUBIN TOTAL: 1.3 mg/dL — AB (ref 0.3–1.2)
BUN: 22 mg/dL — AB (ref 6–20)
CALCIUM: 9.3 mg/dL (ref 8.9–10.3)
CO2: 22 mmol/L (ref 22–32)
CREATININE: 0.98 mg/dL (ref 0.44–1.00)
Chloride: 110 mmol/L (ref 101–111)
GFR calc Af Amer: >60 mL/min (ref >60)
GFR, EST NON AFRICAN AMERICAN: 58 mL/min — AB (ref >60)
Glucose, Bld: 103 mg/dL — ABNORMAL HIGH (ref 65–99)
POTASSIUM: 4.6 mmol/L (ref 3.5–5.1)
Sodium: 143 mmol/L (ref 135–145)
TOTAL PROTEIN: 6.4 g/dL — AB (ref 6.5–8.1)

## 2015-05-05 LAB — HEPATIC FUNCTION PANEL
ALT: 28 U/L (ref 14–54)
AST: 30 U/L (ref 15–41)
Albumin: 3.2 g/dL — ABNORMAL LOW (ref 3.5–5.0)
Alkaline Phosphatase: 96 U/L (ref 38–126)
BILIRUBIN DIRECT: 0.3 mg/dL (ref 0.1–0.5)
BILIRUBIN INDIRECT: 1.2 mg/dL — AB (ref 0.3–0.9)
TOTAL PROTEIN: 7 g/dL (ref 6.5–8.1)
Total Bilirubin: 1.5 mg/dL — ABNORMAL HIGH (ref 0.3–1.2)

## 2015-05-05 LAB — POCT INR: INR: 2.1

## 2015-05-05 LAB — DIGOXIN LEVEL

## 2015-05-05 MED ORDER — TORSEMIDE 20 MG PO TABS: 20.0000 mg | ORAL_TABLET | Freq: Every day | ORAL | Status: DC

## 2015-05-05 NOTE — Patient Instructions (Signed)
Please take Torsemide 20mg  daily.  Routine lab work today. Will notify you of abnormal results  You have been referred to Neurology they will contact you for an appt.   Follow up in 1 month with Dr.McLean

## 2015-05-06 NOTE — Progress Notes (Signed)
Patient ID: EMALYN SCHOU, female   DOB: 10-17-1945, 70 y.o.   MRN: [id number]    ADVANCED HF CLINIC NOTE  PCP: Dr. Duanne Guess Cardiology: Dr. Shirlee Latch  70 y/o female with history of HTN who was admitted 07/20/14 with 3 months of exertional dyspnea, weight gain, cough, and orthopnea. Found to have EF 20% and a large pericardial effusion. Cath 5/16 no CAD. Hospital course complicated by atrial fibrillation w/ RVR, converted to NSR on amiodarone. Underwent pericardial window (transudate).    She had a repeat echo in 6/16 that showed persistently low LV EF at 15-20% with recurrence of moderate to large pericardial effusion without tamponade. This showed EF 10-15%, mild LV dilation, restrictive diastolic function, moderate to severely decreased RV function, and a moderate to large pericardial effusion similar to 6/16.   Admitted in November with marked volume overload. Diuresed with IV lasix. Discharged on milrinone 0.125 mcg. BB and ACE stopped. Discharge weight was 92 pounds.   She had problems with her PICC line (numbness/pain in right arm/hand) and wanted the PICC line out.  She did not want placement of tunneled catheter.  Therefore, PICC was removed and milrinone stopped.  She re-developed symptomatic low output CHF and was re-admitted in 12/16.  She then had tunneled catheter placed and was restarted on milrinone and was diuresed.  She was discharged home back on milrinone.  Echo in 12/16 showed EF 20% with LV thrombus.  She was put on coumadin.   Weight is up 5 lbs today on our scales.  She says that it has been stable at home . She is short of breath after walking about 1 block.  Mild dyspnea walking in to office today.  Much better than when she was off milrinone prior to last appointment.  She has 2 pillow orthopnea.  Still with numbness and pain in right hand and forearm which she attributes to the PICC line.  Right hand is weak.  She is only taking torsemide 2-3 times/week.  She still thinks that  the torsemide makes her short of breath.   Labs  5/16: K 4.6, creatinine 1.0 => 0.81, TSH normal, LFTs normal, TSH normal, BNP 1268 7/16: K 5.3 => 5.4, creatinine 1.0 => 1.02, HCT 39.7, BNP > 4500 => 1082, SPEP negative, urine IFE negative 8/16: K 5, creatinine 0.83 11/30/14: K 4.2 Creatinine 0.79  02/09/2015: K 4.5 Creatinine 1.11 12/16: digoxin < 0.2 1/17: K 3.8, creatinine 0.96, HCT 34.9  SH: Married, no smoking, no ETOH.   FH: No history of cardiomyopathy or sudden death.    ROS: All systems reviewed and negative except as per HPI.   PMH: 1. Chronic systolic CHF: Nonischemic cardiomyopathy.  LHC/RHC (4/16) with no significant coronary disease; mean RA 5, PA 28/11, mean PCWP 9, CI 3.39.  Echo (4/16): EF 20% with diffuse hypokinesis, moderate RV systolic dysfunction, large pericardial effusion.  No history of drug or ETOH abuse.  No family history of cardiomyopathy.   Echo (6/16) with EF 15-20%, restrictive diastolic function, moderate MR, moderately decreased RV systolic function, PA systolic pressure 49 mmHg, moderate-large pericardial effusion without tamponade.  Echo (7/16) with EF 10-15%, mild LV dilation, diffuse hypokinesis, restrictive diastolic dysfunction, moderate MR, moderate to severely decreased RV systolic function, moderate TR, PA systolic pressure 51 mmHg, moderate to large pericardial effusion, no change from 6/16.  Echo (8/16) with EF 10-15%, mild to moderately reduced RV systolic function, moderate pericardial effusion (slightly smaller than prior), PASP 63 mmHg.  Cardiac MRI (  8/16) with LV EF 11%, RV EF 30%, mid-wall LGE involving the basal to mid septum (?myocarditis), moderate to large pericardial effusion with no evidence for tamponade.  Echo (12/16): EF 20%, LV thrombus, mild MR, PASP 66 mmHg.  2. Pericardial effusion: Large on 4/16 echo.  Patient had pericardial window for diagnostic and therapeutic purposes.  Cytology negative for malignancy.  6/16 echo with recurrence  of moderate-large pericardial effusion without tamponade, similar repeat echo in 7/16, effusion slightly smaller in 8/16 by echo and MRI.  3. Atrial fibrillation: Atrial fibrillation with RVR, converted to NSR with amiodarone.   4. HTN 5. Noncompliance 6. LV thrombus: Noted on 12/16 echo.   Current Outpatient Prescriptions  Medication Sig Dispense Refill  . amiodarone (PACERONE) 100 MG tablet Take 1 tablet (100 mg total) by mouth daily. 30 tablet 5  . digoxin 62.5 MCG TABS Take 0.0625 mg by mouth daily. 30 tablet 6  . feeding supplement, ENSURE ENLIVE, (ENSURE ENLIVE) LIQD Take 237 mLs by mouth 2 (two) times daily between meals. 237 mL 12  . Magnesium Oxide 200 MG TABS Take 1 tablet (200 mg total) by mouth 2 (two) times daily. 90 each 3  . milrinone (PRIMACOR) 20 MG/100ML SOLN infusion Inject 11.6 mcg/min into the vein continuous. 100 mL 0  . potassium chloride SA (K-DUR,KLOR-CON) 20 MEQ tablet Take 2 tablets (40 mEq total) by mouth daily. 60 tablet 6  . spironolactone (ALDACTONE) 25 MG tablet Take 0.5 tablets (12.5 mg total) by mouth daily. 15 tablet 6  . torsemide (DEMADEX) 20 MG tablet Take 1 tablet (20 mg total) by mouth daily. 30 tablet 0  . warfarin (COUMADIN) 2 MG tablet Take as directed by [facility] 70 tablet 1   No current facility-administered medications for this encounter.    No Known Allergies    Filed Vitals:   05/05/15 1443  BP: 113/80  Pulse: 93  Height:  (1.575 m)  Weight: 103 lb (46.72 kg)  SpO2: 100%    PHYSICAL EXAM: General: Elderly, chronically ill appearing.  NAD . HEENT: normal Neck: supple. JVP 7-8  cm.  Carotids 2+ bilat; no bruits. No lymphadenopathy or thryomegaly appreciated. Cor: PMI nondisplaced. Regular rate & rhythm. Paradoxical S2 split Lungs: Clear  Abdomen: soft, nontender, + distended. No hepatosplenomegaly. No bruits or masses. +BS Extremities: no cyanosis, clubbing, rash.  No edeam. RUE PICC.    Neuro: alert & oriented x 3,  cranial nerves grossly intact. moves all 4 extremities w/o difficulty. Affect pleasant.  ASSESSMENT & PLAN: 1. Chronic systolic HF: Due to nonischemic cardiomyopathy.  CMRI (8/16) showed LV EF 11%, RV EF 30%, and patchy mid-wall LGE in the basal-mid septum that may suggest prior myocarditis.  She is now back on milrinone 0.25 via tunneled catheter with no problems.  NYHA class III symptoms.  Mild volume overload on exam and weight is up.  Echo (12/16) with EF 20% and LV thrombus.  - She needs to take torsemide 20 mg daily rather than once a week.  We discussed how the torsemide is not making her short of breath, her CHF is.  I showed her her echocardiogram to help her understand. BMET in 1 week.  - Continue milrinone 0.25.  She did not tolerate stopping milrinone.  - Continue current digoxin, check level. - Continue spironolactone.  - No beta blocker with low output.  - She has advanced heart failure with high risk of morbidity/mortality over the next 6 months but she has great difficulty  following cardiology recommendations.  I have discussed with her the limitations of milrinone therapy.  I will give her a handout on LVAD to let herself get familiarized with it.  Given compliance issues, I think that she is going to be a poor LVAD candidate. - She has LBBB-like IVCD, QRS 138 msec.  I do not think that she would derive enough benefit from CRT to get off milrinone.  I do not think she would agree to device placement. 2. PAF:  NSR. Continue amio to 100 mg daily. Check TSH, LFTs. She was told to get regular eye exams while on amiodarone.  - Continue warfarin.  3. Pericardial effusion: s/p pericardial window. Transudate, cytology negative.  Last ECHO 02/2015 EF 10-15% and stable moderate pericardial effusion => no tamponade.  4. LV thrombus: She is on warfarin.   [name] 05/06/2015

## 2015-05-09 DIAGNOSIS — D649 Anemia, unspecified: Secondary | ICD-10-CM | POA: Diagnosis not present

## 2015-05-09 DIAGNOSIS — I428 Other cardiomyopathies: Secondary | ICD-10-CM | POA: Diagnosis not present

## 2015-05-09 DIAGNOSIS — I480 Paroxysmal atrial fibrillation: Secondary | ICD-10-CM | POA: Diagnosis not present

## 2015-05-09 DIAGNOSIS — I5042 Chronic combined systolic (congestive) and diastolic (congestive) heart failure: Secondary | ICD-10-CM | POA: Diagnosis not present

## 2015-05-09 DIAGNOSIS — I110 Hypertensive heart disease with heart failure: Secondary | ICD-10-CM | POA: Diagnosis not present

## 2015-05-09 DIAGNOSIS — I214 Non-ST elevation (NSTEMI) myocardial infarction: Secondary | ICD-10-CM | POA: Diagnosis not present

## 2015-05-12 DIAGNOSIS — I480 Paroxysmal atrial fibrillation: Secondary | ICD-10-CM | POA: Diagnosis not present

## 2015-05-12 DIAGNOSIS — I110 Hypertensive heart disease with heart failure: Secondary | ICD-10-CM | POA: Diagnosis not present

## 2015-05-12 DIAGNOSIS — D649 Anemia, unspecified: Secondary | ICD-10-CM | POA: Diagnosis not present

## 2015-05-12 DIAGNOSIS — I428 Other cardiomyopathies: Secondary | ICD-10-CM | POA: Diagnosis not present

## 2015-05-12 DIAGNOSIS — I5042 Chronic combined systolic (congestive) and diastolic (congestive) heart failure: Secondary | ICD-10-CM | POA: Diagnosis not present

## 2015-05-12 DIAGNOSIS — I214 Non-ST elevation (NSTEMI) myocardial infarction: Secondary | ICD-10-CM | POA: Diagnosis not present

## 2015-05-13 ENCOUNTER — Telehealth (HOSPITAL_COMMUNITY): Payer: Self-pay | Admitting: *Deleted

## 2015-05-13 NOTE — Telephone Encounter (Signed)
Pt called and requested her OV notes and notes from when she was admitted to the hospital in April, she states she needs them for inusrance purposes.  Records from all OV notes in our office from April and her h&p and d/c note from April all printed and left at front desk, she states her husband will pick them up.  Had received VM from pt's [name] late yesterday stating pt had c/o increased SOB, wt stable, no edema sat 94% bp 92/60.  Discussed w/pt she states she is feeling much better today after a good nights rest, she feels that she sometimes over does things and does not rest like she should and that causes her to get more fatigued and SOB, she states wt is stable.

## 2015-05-16 DIAGNOSIS — D649 Anemia, unspecified: Secondary | ICD-10-CM | POA: Diagnosis not present

## 2015-05-16 DIAGNOSIS — I428 Other cardiomyopathies: Secondary | ICD-10-CM | POA: Diagnosis not present

## 2015-05-16 DIAGNOSIS — I214 Non-ST elevation (NSTEMI) myocardial infarction: Secondary | ICD-10-CM | POA: Diagnosis not present

## 2015-05-16 DIAGNOSIS — I110 Hypertensive heart disease with heart failure: Secondary | ICD-10-CM | POA: Diagnosis not present

## 2015-05-16 DIAGNOSIS — I5042 Chronic combined systolic (congestive) and diastolic (congestive) heart failure: Secondary | ICD-10-CM | POA: Diagnosis not present

## 2015-05-16 DIAGNOSIS — I480 Paroxysmal atrial fibrillation: Secondary | ICD-10-CM | POA: Diagnosis not present

## 2015-05-17 ENCOUNTER — Other Ambulatory Visit (HOSPITAL_COMMUNITY): Payer: Self-pay | Admitting: Cardiology

## 2015-05-19 ENCOUNTER — Ambulatory Visit (INDEPENDENT_AMBULATORY_CARE_PROVIDER_SITE_OTHER): Payer: Medicare Other | Admitting: Cardiology

## 2015-05-19 DIAGNOSIS — Z7901 Long term (current) use of anticoagulants: Secondary | ICD-10-CM

## 2015-05-19 DIAGNOSIS — I5042 Chronic combined systolic (congestive) and diastolic (congestive) heart failure: Secondary | ICD-10-CM | POA: Diagnosis not present

## 2015-05-19 DIAGNOSIS — I110 Hypertensive heart disease with heart failure: Secondary | ICD-10-CM | POA: Diagnosis not present

## 2015-05-19 DIAGNOSIS — D649 Anemia, unspecified: Secondary | ICD-10-CM | POA: Diagnosis not present

## 2015-05-19 DIAGNOSIS — I213 ST elevation (STEMI) myocardial infarction of unspecified site: Secondary | ICD-10-CM

## 2015-05-19 DIAGNOSIS — I480 Paroxysmal atrial fibrillation: Secondary | ICD-10-CM | POA: Diagnosis not present

## 2015-05-19 DIAGNOSIS — I513 Intracardiac thrombosis, not elsewhere classified: Secondary | ICD-10-CM

## 2015-05-19 DIAGNOSIS — I428 Other cardiomyopathies: Secondary | ICD-10-CM | POA: Diagnosis not present

## 2015-05-19 DIAGNOSIS — I4891 Unspecified atrial fibrillation: Secondary | ICD-10-CM

## 2015-05-19 DIAGNOSIS — Z5181 Encounter for therapeutic drug level monitoring: Secondary | ICD-10-CM

## 2015-05-19 DIAGNOSIS — I214 Non-ST elevation (NSTEMI) myocardial infarction: Secondary | ICD-10-CM | POA: Diagnosis not present

## 2015-05-19 LAB — POCT INR: INR: 1.6

## 2015-05-23 DIAGNOSIS — I480 Paroxysmal atrial fibrillation: Secondary | ICD-10-CM | POA: Diagnosis not present

## 2015-05-23 DIAGNOSIS — I428 Other cardiomyopathies: Secondary | ICD-10-CM | POA: Diagnosis not present

## 2015-05-23 DIAGNOSIS — I214 Non-ST elevation (NSTEMI) myocardial infarction: Secondary | ICD-10-CM | POA: Diagnosis not present

## 2015-05-23 DIAGNOSIS — I5042 Chronic combined systolic (congestive) and diastolic (congestive) heart failure: Secondary | ICD-10-CM | POA: Diagnosis not present

## 2015-05-23 DIAGNOSIS — D649 Anemia, unspecified: Secondary | ICD-10-CM | POA: Diagnosis not present

## 2015-05-23 DIAGNOSIS — I110 Hypertensive heart disease with heart failure: Secondary | ICD-10-CM | POA: Diagnosis not present

## 2015-05-27 DIAGNOSIS — I480 Paroxysmal atrial fibrillation: Secondary | ICD-10-CM | POA: Diagnosis not present

## 2015-05-27 DIAGNOSIS — I428 Other cardiomyopathies: Secondary | ICD-10-CM | POA: Diagnosis not present

## 2015-05-27 DIAGNOSIS — I110 Hypertensive heart disease with heart failure: Secondary | ICD-10-CM | POA: Diagnosis not present

## 2015-05-27 DIAGNOSIS — D649 Anemia, unspecified: Secondary | ICD-10-CM | POA: Diagnosis not present

## 2015-05-27 DIAGNOSIS — I214 Non-ST elevation (NSTEMI) myocardial infarction: Secondary | ICD-10-CM | POA: Diagnosis not present

## 2015-05-27 DIAGNOSIS — I5042 Chronic combined systolic (congestive) and diastolic (congestive) heart failure: Secondary | ICD-10-CM | POA: Diagnosis not present

## 2015-05-30 ENCOUNTER — Ambulatory Visit (INDEPENDENT_AMBULATORY_CARE_PROVIDER_SITE_OTHER): Payer: Medicare Other | Admitting: Interventional Cardiology

## 2015-05-30 DIAGNOSIS — I513 Intracardiac thrombosis, not elsewhere classified: Secondary | ICD-10-CM

## 2015-05-30 DIAGNOSIS — D649 Anemia, unspecified: Secondary | ICD-10-CM | POA: Diagnosis not present

## 2015-05-30 DIAGNOSIS — I480 Paroxysmal atrial fibrillation: Secondary | ICD-10-CM | POA: Diagnosis not present

## 2015-05-30 DIAGNOSIS — I110 Hypertensive heart disease with heart failure: Secondary | ICD-10-CM | POA: Diagnosis not present

## 2015-05-30 DIAGNOSIS — Z7901 Long term (current) use of anticoagulants: Secondary | ICD-10-CM

## 2015-05-30 DIAGNOSIS — I213 ST elevation (STEMI) myocardial infarction of unspecified site: Secondary | ICD-10-CM

## 2015-05-30 DIAGNOSIS — I4891 Unspecified atrial fibrillation: Secondary | ICD-10-CM

## 2015-05-30 DIAGNOSIS — I214 Non-ST elevation (NSTEMI) myocardial infarction: Secondary | ICD-10-CM | POA: Diagnosis not present

## 2015-05-30 DIAGNOSIS — I428 Other cardiomyopathies: Secondary | ICD-10-CM | POA: Diagnosis not present

## 2015-05-30 DIAGNOSIS — Z5181 Encounter for therapeutic drug level monitoring: Secondary | ICD-10-CM

## 2015-05-30 DIAGNOSIS — I5042 Chronic combined systolic (congestive) and diastolic (congestive) heart failure: Secondary | ICD-10-CM | POA: Diagnosis not present

## 2015-05-30 LAB — POCT INR: INR: 1.6

## 2015-05-31 ENCOUNTER — Telehealth (HOSPITAL_COMMUNITY): Payer: Self-pay | Admitting: Vascular Surgery

## 2015-05-31 ENCOUNTER — Encounter (HOSPITAL_COMMUNITY): Payer: Medicare Other

## 2015-05-31 NOTE — Telephone Encounter (Signed)
Returned pt call to cancel appt/ left message to call back to reschedule

## 2015-06-03 ENCOUNTER — Other Ambulatory Visit (HOSPITAL_COMMUNITY): Payer: Self-pay | Admitting: Cardiology

## 2015-06-04 ENCOUNTER — Emergency Department (HOSPITAL_COMMUNITY): Payer: Medicare Other

## 2015-06-04 ENCOUNTER — Inpatient Hospital Stay (HOSPITAL_COMMUNITY)
Admission: EM | Admit: 2015-06-04 | Discharge: 2015-06-13 | DRG: 314 | Disposition: A | Discharge status: Skilled Nursing Facility | Payer: Medicare Other | Attending: Internal Medicine | Admitting: Internal Medicine

## 2015-06-04 ENCOUNTER — Encounter (HOSPITAL_COMMUNITY): Payer: Self-pay | Admitting: Emergency Medicine

## 2015-06-04 DIAGNOSIS — I3139 Other pericardial effusion (noninflammatory): Secondary | ICD-10-CM | POA: Diagnosis present

## 2015-06-04 DIAGNOSIS — I313 Pericardial effusion (noninflammatory): Secondary | ICD-10-CM | POA: Diagnosis present

## 2015-06-04 DIAGNOSIS — R5381 Other malaise: Secondary | ICD-10-CM | POA: Diagnosis not present

## 2015-06-04 DIAGNOSIS — E43 Unspecified severe protein-calorie malnutrition: Secondary | ICD-10-CM | POA: Diagnosis not present

## 2015-06-04 DIAGNOSIS — M542 Cervicalgia: Secondary | ICD-10-CM | POA: Diagnosis present

## 2015-06-04 DIAGNOSIS — D62 Acute posthemorrhagic anemia: Secondary | ICD-10-CM | POA: Insufficient documentation

## 2015-06-04 DIAGNOSIS — Z9119 Patient's noncompliance with other medical treatment and regimen: Secondary | ICD-10-CM

## 2015-06-04 DIAGNOSIS — I272 Other secondary pulmonary hypertension: Secondary | ICD-10-CM | POA: Diagnosis present

## 2015-06-04 DIAGNOSIS — T80211A Bloodstream infection due to central venous catheter, initial encounter: Principal | ICD-10-CM | POA: Diagnosis present

## 2015-06-04 DIAGNOSIS — A419 Sepsis, unspecified organism: Secondary | ICD-10-CM | POA: Diagnosis not present

## 2015-06-04 DIAGNOSIS — M62830 Muscle spasm of back: Secondary | ICD-10-CM | POA: Diagnosis present

## 2015-06-04 DIAGNOSIS — N200 Calculus of kidney: Secondary | ICD-10-CM | POA: Diagnosis not present

## 2015-06-04 DIAGNOSIS — A4101 Sepsis due to Methicillin susceptible Staphylococcus aureus: Secondary | ICD-10-CM | POA: Diagnosis not present

## 2015-06-04 DIAGNOSIS — Z681 Body mass index (BMI) 19 or less, adult: Secondary | ICD-10-CM

## 2015-06-04 DIAGNOSIS — R0602 Shortness of breath: Secondary | ICD-10-CM

## 2015-06-04 DIAGNOSIS — I5023 Acute on chronic systolic (congestive) heart failure: Secondary | ICD-10-CM | POA: Diagnosis not present

## 2015-06-04 DIAGNOSIS — I110 Hypertensive heart disease with heart failure: Secondary | ICD-10-CM | POA: Diagnosis present

## 2015-06-04 DIAGNOSIS — I340 Nonrheumatic mitral (valve) insufficiency: Secondary | ICD-10-CM | POA: Diagnosis present

## 2015-06-04 DIAGNOSIS — I428 Other cardiomyopathies: Secondary | ICD-10-CM | POA: Diagnosis present

## 2015-06-04 DIAGNOSIS — I4581 Long QT syndrome: Secondary | ICD-10-CM | POA: Diagnosis present

## 2015-06-04 DIAGNOSIS — M4624 Osteomyelitis of vertebra, thoracic region: Secondary | ICD-10-CM | POA: Diagnosis present

## 2015-06-04 DIAGNOSIS — R7881 Bacteremia: Secondary | ICD-10-CM | POA: Insufficient documentation

## 2015-06-04 DIAGNOSIS — K8020 Calculus of gallbladder without cholecystitis without obstruction: Secondary | ICD-10-CM | POA: Diagnosis not present

## 2015-06-04 DIAGNOSIS — I5041 Acute combined systolic (congestive) and diastolic (congestive) heart failure: Secondary | ICD-10-CM | POA: Insufficient documentation

## 2015-06-04 DIAGNOSIS — M549 Dorsalgia, unspecified: Secondary | ICD-10-CM | POA: Diagnosis not present

## 2015-06-04 DIAGNOSIS — M4644 Discitis, unspecified, thoracic region: Secondary | ICD-10-CM | POA: Insufficient documentation

## 2015-06-04 DIAGNOSIS — J9811 Atelectasis: Secondary | ICD-10-CM | POA: Diagnosis not present

## 2015-06-04 DIAGNOSIS — I480 Paroxysmal atrial fibrillation: Secondary | ICD-10-CM | POA: Diagnosis present

## 2015-06-04 DIAGNOSIS — Y848 Other medical procedures as the cause of abnormal reaction of the patient, or of later complication, without mention of misadventure at the time of the procedure: Secondary | ICD-10-CM | POA: Diagnosis present

## 2015-06-04 DIAGNOSIS — Z7901 Long term (current) use of anticoagulants: Secondary | ICD-10-CM

## 2015-06-04 DIAGNOSIS — I513 Intracardiac thrombosis, not elsewhere classified: Secondary | ICD-10-CM | POA: Diagnosis present

## 2015-06-04 DIAGNOSIS — Z79899 Other long term (current) drug therapy: Secondary | ICD-10-CM

## 2015-06-04 DIAGNOSIS — M545 Low back pain: Secondary | ICD-10-CM | POA: Diagnosis not present

## 2015-06-04 DIAGNOSIS — D72829 Elevated white blood cell count, unspecified: Secondary | ICD-10-CM | POA: Insufficient documentation

## 2015-06-04 DIAGNOSIS — R911 Solitary pulmonary nodule: Secondary | ICD-10-CM | POA: Insufficient documentation

## 2015-06-04 DIAGNOSIS — E440 Moderate protein-calorie malnutrition: Secondary | ICD-10-CM | POA: Insufficient documentation

## 2015-06-04 DIAGNOSIS — A4159 Other Gram-negative sepsis: Secondary | ICD-10-CM | POA: Diagnosis not present

## 2015-06-04 DIAGNOSIS — I447 Left bundle-branch block, unspecified: Secondary | ICD-10-CM | POA: Diagnosis present

## 2015-06-04 LAB — URINALYSIS, ROUTINE W REFLEX MICROSCOPIC
BILIRUBIN URINE: NEGATIVE
Glucose, UA: NEGATIVE mg/dL
Ketones, ur: 15 mg/dL — AB
Leukocytes, UA: NEGATIVE
NITRITE: NEGATIVE
SPECIFIC GRAVITY, URINE: 1.028 (ref 1.005–1.030)
pH: 5 (ref 5.0–8.0)

## 2015-06-04 LAB — CBC
HEMATOCRIT: 35.5 % — AB (ref 36.0–46.0)
HEMOGLOBIN: 11.4 g/dL — AB (ref 12.0–15.0)
MCH: 27 pg (ref 26.0–34.0)
MCHC: 32.1 g/dL (ref 30.0–36.0)
MCV: 84.1 fL (ref 78.0–100.0)
Platelets: 235 10*3/uL (ref 150–400)
RBC: 4.22 MIL/uL (ref 3.87–5.11)
RDW: 15.7 % — AB (ref 11.5–15.5)
WBC: 24.9 10*3/uL — ABNORMAL HIGH (ref 4.0–10.5)

## 2015-06-04 LAB — COMPREHENSIVE METABOLIC PANEL
ALBUMIN: 3.2 g/dL — AB (ref 3.5–5.0)
ALK PHOS: 146 U/L — AB (ref 38–126)
ALT: 28 U/L (ref 14–54)
ANION GAP: 19 — AB (ref 5–15)
AST: 21 U/L (ref 15–41)
BILIRUBIN TOTAL: 2.7 mg/dL — AB (ref 0.3–1.2)
BUN: 19 mg/dL (ref 6–20)
CALCIUM: 9.8 mg/dL (ref 8.9–10.3)
CO2: 20 mmol/L — ABNORMAL LOW (ref 22–32)
Chloride: 103 mmol/L (ref 101–111)
Creatinine, Ser: 1.07 mg/dL — ABNORMAL HIGH (ref 0.44–1.00)
GFR, EST NON AFRICAN AMERICAN: 52 mL/min — AB (ref >60)
GLUCOSE: 129 mg/dL — AB (ref 65–99)
POTASSIUM: 4.6 mmol/L (ref 3.5–5.1)
Sodium: 142 mmol/L (ref 135–145)
TOTAL PROTEIN: 7.9 g/dL (ref 6.5–8.1)

## 2015-06-04 LAB — I-STAT CG4 LACTIC ACID, ED: LACTIC ACID, VENOUS: 1.93 mmol/L (ref 0.5–2.0)

## 2015-06-04 LAB — I-STAT TROPONIN, ED: TROPONIN I, POC: 0.04 ng/mL (ref 0.00–0.08)

## 2015-06-04 LAB — URINE MICROSCOPIC-ADD ON

## 2015-06-04 MED ORDER — HYDROMORPHONE HCL 1 MG/ML IJ SOLN
1.0000 mg | Freq: Once | INTRAMUSCULAR | Status: AC
  Administered 2015-06-05: 1 mg via INTRAVENOUS
  Filled 2015-06-04: qty 1

## 2015-06-04 MED ORDER — SODIUM CHLORIDE 0.9% FLUSH: 10.0000 mL | INTRAVENOUS | Status: DC | PRN

## 2015-06-04 MED ORDER — HYDROMORPHONE HCL 1 MG/ML IJ SOLN
0.5000 mg | Freq: Once | INTRAMUSCULAR | Status: AC
  Administered 2015-06-04: 0.5 mg via INTRAVENOUS
  Filled 2015-06-04: qty 1

## 2015-06-04 MED ORDER — IOHEXOL 300 MG/ML  SOLN: 25.0000 mL | INTRAMUSCULAR | Status: AC

## 2015-06-04 NOTE — ED Notes (Signed)
Pt c/o back spasms. Pt unable to sit down. Spasms go from low back up to neck.

## 2015-06-04 NOTE — ED Notes (Signed)
Called pt to be triaged, no response.  

## 2015-06-04 NOTE — ED Provider Notes (Signed)
CSN: [id number]     Arrival date & time 06/04/15  1526 History   First MD Initiated Contact with Patient 06/04/15 2012     Chief Complaint  Patient presents with  . Spasms      HPI 70 y.o. female with history of severe CHF with last LVEF of 20% in April 2016, nonischemic cardiomyopathy on milrinone, HTN, atrial fibrillation on warfarin, who presents with severe back pain. Patient reports that the pain began 3 days ago. Described as a cramping spasm sensation that starts in her low back and radiates up to the left side of her neck. She denies recent falls or midline back pain. Denies fevers. She endorses shortness of breath but this is at her baseline in the setting of her heart failure. Denies chest pain. Denies nausea, vomiting, dysuria, frequency, flank pain, diarrhea, constipation, or abdominal pain. No evidence of back surgery.  Past Medical History  Diagnosis Date  . Anemia   . Hypertension   . Blood transfusion without reported diagnosis   . Chronic systolic CHF (congestive heart failure) (HCC)     a. 06/2014 Echo: EF 20%.  Marland Kitchen NICM (nonischemic cardiomyopathy) (HCC)     a. 06/2014 Echo: EF 20%, diff HK, mild MR, mildly dil LA/RA, mildly reduced RV fxn;  b. 06/2014 Cath: nl cors.  . Pericardial effusion     a. 06/2014 Large effusion, no tamponade phys.   Past Surgical History  Procedure Laterality Date  . No past surgeries    . Left and right heart catheterization with coronary angiogram N/A 07/23/2014    Procedure: LEFT AND RIGHT HEART CATHETERIZATION WITH CORONARY ANGIOGRAM;  Surgeon: Laurey Morale, MD;  Location: [facility] CATH LAB;  Service: Cardiovascular;  Laterality: N/A;  . Subxyphoid pericardial window N/A 07/26/2014    Procedure: SUBXYPHOID PERICARDIAL WINDOW;  Surgeon: Delight Ovens, MD;  Location: [facility] OR;  Service: Thoracic;  Laterality: N/A;  . Tee without cardioversion N/A 07/26/2014    Procedure: TRANSESOPHAGEAL ECHOCARDIOGRAM (TEE);  Surgeon: Delight Ovens, MD;  Location:  [facility] OR;  Service: Thoracic;  Laterality: N/A;  . Pleural effusion drainage N/A 07/26/2014    Procedure: DRAINAGE OF PERICARDIAL EFFUSION;  Surgeon: Delight Ovens, MD;  Location: [facility] OR;  Service: Thoracic;  Laterality: N/A;  . Cardiac catheterization N/A 02/07/2015    Procedure: Right Heart Cath;  Surgeon: Laurey Morale, MD;  Location: [facility] INVASIVE CV LAB;  Service: Cardiovascular;  Laterality: N/A;   Family History  Problem Relation Age of Onset  . Hypertension Sister   . Diabetes Maternal Grandmother   . Cancer Mother     died young (pt was only in [employer] @ the time)  . Other Father     died in his 80's.   Social History  Substance Use Topics  . Smoking status: Never Smoker   . Smokeless tobacco: Never Used  . Alcohol Use: No   OB History    No data available     Review of Systems  Constitutional: Positive for fatigue. Negative for fever, chills, activity change and appetite change.  HENT: Negative for congestion, rhinorrhea and sore throat.   Eyes: Negative for visual disturbance.  Respiratory: Positive for shortness of breath. Negative for cough and wheezing.   Cardiovascular: Negative for chest pain and palpitations.  Gastrointestinal: Negative for nausea, vomiting, abdominal pain, diarrhea, constipation, blood in stool and abdominal distention.  Genitourinary: Negative for dysuria, frequency and flank pain.  Musculoskeletal: Positive for back pain and neck  pain. Negative for myalgias, joint swelling, arthralgias, gait problem and neck stiffness.  Skin: Negative for rash.  Neurological: Negative for dizziness, tremors, syncope, facial asymmetry, speech difficulty, weakness, numbness and headaches.  Psychiatric/Behavioral: Negative for behavioral problems, confusion and agitation.      Allergies  Review of patient's allergies indicates no known allergies.  Home Medications   Prior to Admission medications   Medication Sig Start Date End Date Taking?  Authorizing Provider  amiodarone (PACERONE) 100 MG tablet Take 1 tablet (100 mg total) by mouth daily. 11/30/14  Yes Amy D Clegg, NP  digoxin 62.5 MCG TABS Take 0.0625 mg by mouth daily. 02/09/15  Yes Amy D Clegg, NP  Magnesium Oxide 200 MG TABS Take 1 tablet (200 mg total) by mouth 2 (two) times daily. 04/26/15  Yes Graciella Freer, PA-C  milrinone [facility]) 20 MG/100ML SOLN infusion Inject 11.6 mcg/min into the vein continuous. 03/28/15  Yes Calvert Cantor, MD  potassium chloride SA (K-DUR,KLOR-CON) 20 MEQ tablet Take 2 tablets (40 mEq total) by mouth daily. 02/09/15  Yes Amy D Clegg, NP  spironolactone (ALDACTONE) 25 MG tablet Take 0.5 tablets (12.5 mg total) by mouth daily. 02/09/15  Yes Amy D Clegg, NP  torsemide (DEMADEX) 20 MG tablet Take 1 tablet (20 mg total) by mouth daily. 05/05/15  Yes Laurey Morale, MD  warfarin (COUMADIN) 2 MG tablet Take as directed by [facility] Patient taking differently: Take 4-6 mg by mouth daily at 6 PM. Takes  on mon, wed and fri  Takes  all other days 04/26/15  Yes Laurey Morale, MD  feeding supplement, ENSURE ENLIVE, (ENSURE ENLIVE) LIQD Take 237 mLs by mouth 2 (two) times daily between meals. 03/28/15   Calvert Cantor, MD   BP 118/86 mmHg  Pulse 111  Temp(Src) 98.5 F (36.9 C) (Oral)  Resp 36  Wt 46.72 kg  SpO2 98% Physical Exam  Constitutional: She is oriented to person, place, and time. She appears well-developed and well-nourished. No distress.  HENT:  Head: Normocephalic and atraumatic.  Right Ear: External ear normal.  Left Ear: External ear normal.  Nose: Nose normal.  Mouth/Throat: Oropharynx is clear and moist. No oropharyngeal exudate.  Eyes: Conjunctivae and EOM are normal. Pupils are equal, round, and reactive to light. Right eye exhibits no discharge. Left eye exhibits no discharge.  Neck: Normal range of motion. Neck supple.  TTP of the L perispinous muscles of the cervical spine  Cardiovascular: Normal rate, regular rhythm  and normal heart sounds.  Exam reveals no gallop and no friction rub.   No murmur heard. Pulmonary/Chest: Effort normal. No respiratory distress. She has no wheezes. She has no rales.  Crackles at the bilateral bases  Abdominal: Soft. Bowel sounds are normal. She exhibits no distension and no mass. There is no tenderness. There is no rebound and no guarding.  Musculoskeletal: Normal range of motion. She exhibits tenderness (TTP of the L perispinous muscles of the lower thoracic and upper lumbar spine). She exhibits no edema.  Neurological: She is alert and oriented to person, place, and time. She exhibits normal muscle tone.  Face symmetric, tongue midline. 5/5 strength in the proximal and distal upper and lower extremities bilaterally, with intact sensation to light touch. Normal finger to nose, heel to shin, and rapid alternating movements. Normal speech.    Skin: Skin is warm and dry. No rash noted. She is not diaphoretic.  Psychiatric: She has a normal mood and affect. Her behavior is normal. Judgment  and thought content normal.    ED Course  Procedures (including critical care time) Labs Review Labs Reviewed  CBC - Abnormal; Notable for the following:    WBC 24.9 (*)    Hemoglobin 11.4 (*)    HCT 35.5 (*)    RDW 15.7 (*)    All other components within normal limits  COMPREHENSIVE METABOLIC PANEL - Abnormal; Notable for the following:    CO2 20 (*)    Glucose, Bld 129 (*)    Creatinine, Ser 1.07 (*)    Albumin 3.2 (*)    Alkaline Phosphatase 146 (*)    Total Bilirubin 2.7 (*)    GFR calc non Af Amer 52 (*)    Anion gap 19 (*)    All other components within normal limits  URINALYSIS, ROUTINE W REFLEX MICROSCOPIC (NOT AT [facility]) - Abnormal; Notable for the following:    Color, Urine AMBER (*)    APPearance CLOUDY (*)    Hgb urine dipstick LARGE (*)    Ketones, ur 15 (*)    Protein, ur >300 (*)    All other components within normal limits  URINE MICROSCOPIC-ADD ON - Abnormal;  Notable for the following:    Squamous Epithelial / LPF 0-5 (*)    Bacteria, UA FEW (*)    Casts GRANULAR CAST (*)    All other components within normal limits  CULTURE, BLOOD (ROUTINE X 2)  CULTURE, BLOOD (ROUTINE X 2)  SEDIMENTATION RATE  C-REACTIVE PROTEIN  I-STAT TROPOININ, ED  I-STAT CG4 LACTIC ACID, ED    Imaging Review Dg Chest Port 1 View  06/04/2015  CLINICAL DATA:  Shortness of breath EXAM: PORTABLE CHEST 1 VIEW COMPARISON:  03/22/2015 chest radiograph. FINDINGS: Right internal jugular central venous catheter terminates at the cavoatrial junction. Stable cardiomediastinal silhouette with moderate cardiomegaly. No pneumothorax. Small bilateral pleural effusions. Mild pulmonary edema. Mild bibasilar atelectasis. There is a new subcentimeter nodular density in the peripheral left mid lung. IMPRESSION: 1. Moderate cardiomegaly and mild pulmonary edema, in keeping with mild congestive heart failure. 2. Small bilateral pleural effusions and mild bibasilar atelectasis. 3. New subcentimeter nodular density in the peripheral left mid lung, cannot exclude a pulmonary nodule. Recommend correlation with chest CT on a short term outpatient basis. Electronically Signed   By: Delbert Phenix M.D.   On: 06/04/2015 20:44   I have personally reviewed and evaluated these images and lab results as part of my medical decision-making.   EKG Interpretation   Date/Time:  Saturday June 04 2015 20:32:59 EST Ventricular Rate:  101 PR Interval:  145 QRS Duration: 149 QT Interval:  412 QTC Calculation: 534 R Axis:   91 Text Interpretation:  Sinus tachycardia Multiform ventricular premature  complexes LVH with secondary repolarization abnormality Anterior infarct,  acute (LAD) Prolonged QT interval Confirmed by LIU MD, Annabelle Harman ([id number]) on  06/04/2015 10:14:34 PM      MDM   Final diagnoses:  SOB (shortness of breath)    On arrival the patient is afebrile and hemodynamically stable. While in the ED she  became tachycardic at 110 bpm. Normotensive. No midline tenderness to palpation over the C,T, or L-spine but she has tenderness to palpation over the paraspinous muscles of the lower thoracic and upper lumbar spine.  No lactic acidosis. Troponin negative. Leukocytosis to 24.9. CMP with alkaline phosphatase elevated to 140, T bili elevated to 2.7, and anion gap of 19. Pt meets sepsis criteria for leukocytosis and tachycardia, but is normotensive. Will hold of  on fluid administration at this time given her severe CHF. Given laboratory abnormalities we'll obtain a CT of the abdomen and pelvis to rule out intra-abdominal cause of her pain, as well as MRI of the T and L spine to r/o discitis given the pt's pain, leukocytosis, and presence of a port. Vanc and zosyn given for empiric treatment of infection. Pt is admitted to the step-down unit for further management.     Jenifer Ernestina Penna, MD 06/05/15 1610  Lavera Guise, MD 06/05/15 [phone]

## 2015-06-05 ENCOUNTER — Emergency Department (HOSPITAL_COMMUNITY): Payer: Medicare Other

## 2015-06-05 ENCOUNTER — Encounter (HOSPITAL_COMMUNITY): Payer: Self-pay | Admitting: Radiology

## 2015-06-05 DIAGNOSIS — M4644 Discitis, unspecified, thoracic region: Secondary | ICD-10-CM | POA: Diagnosis not present

## 2015-06-05 DIAGNOSIS — M549 Dorsalgia, unspecified: Secondary | ICD-10-CM | POA: Diagnosis present

## 2015-06-05 DIAGNOSIS — M546 Pain in thoracic spine: Secondary | ICD-10-CM | POA: Diagnosis not present

## 2015-06-05 DIAGNOSIS — Z7901 Long term (current) use of anticoagulants: Secondary | ICD-10-CM | POA: Diagnosis not present

## 2015-06-05 DIAGNOSIS — I340 Nonrheumatic mitral (valve) insufficiency: Secondary | ICD-10-CM | POA: Diagnosis present

## 2015-06-05 DIAGNOSIS — M4624 Osteomyelitis of vertebra, thoracic region: Secondary | ICD-10-CM | POA: Diagnosis not present

## 2015-06-05 DIAGNOSIS — I319 Disease of pericardium, unspecified: Secondary | ICD-10-CM

## 2015-06-05 DIAGNOSIS — E440 Moderate protein-calorie malnutrition: Secondary | ICD-10-CM | POA: Diagnosis not present

## 2015-06-05 DIAGNOSIS — M5489 Other dorsalgia: Secondary | ICD-10-CM | POA: Diagnosis not present

## 2015-06-05 DIAGNOSIS — I10 Essential (primary) hypertension: Secondary | ICD-10-CM | POA: Diagnosis not present

## 2015-06-05 DIAGNOSIS — M5124 Other intervertebral disc displacement, thoracic region: Secondary | ICD-10-CM | POA: Diagnosis not present

## 2015-06-05 DIAGNOSIS — Z452 Encounter for adjustment and management of vascular access device: Secondary | ICD-10-CM | POA: Diagnosis not present

## 2015-06-05 DIAGNOSIS — M4802 Spinal stenosis, cervical region: Secondary | ICD-10-CM | POA: Diagnosis not present

## 2015-06-05 DIAGNOSIS — R7881 Bacteremia: Secondary | ICD-10-CM | POA: Diagnosis not present

## 2015-06-05 DIAGNOSIS — R911 Solitary pulmonary nodule: Secondary | ICD-10-CM | POA: Diagnosis not present

## 2015-06-05 DIAGNOSIS — I351 Nonrheumatic aortic (valve) insufficiency: Secondary | ICD-10-CM | POA: Diagnosis not present

## 2015-06-05 DIAGNOSIS — I5022 Chronic systolic (congestive) heart failure: Secondary | ICD-10-CM | POA: Diagnosis not present

## 2015-06-05 DIAGNOSIS — I513 Intracardiac thrombosis, not elsewhere classified: Secondary | ICD-10-CM | POA: Diagnosis present

## 2015-06-05 DIAGNOSIS — I5021 Acute systolic (congestive) heart failure: Secondary | ICD-10-CM

## 2015-06-05 DIAGNOSIS — M545 Low back pain: Secondary | ICD-10-CM | POA: Diagnosis not present

## 2015-06-05 DIAGNOSIS — I110 Hypertensive heart disease with heart failure: Secondary | ICD-10-CM | POA: Diagnosis present

## 2015-06-05 DIAGNOSIS — E43 Unspecified severe protein-calorie malnutrition: Secondary | ICD-10-CM | POA: Diagnosis present

## 2015-06-05 DIAGNOSIS — M5126 Other intervertebral disc displacement, lumbar region: Secondary | ICD-10-CM | POA: Diagnosis not present

## 2015-06-05 DIAGNOSIS — I5023 Acute on chronic systolic (congestive) heart failure: Secondary | ICD-10-CM

## 2015-06-05 DIAGNOSIS — I361 Nonrheumatic tricuspid (valve) insufficiency: Secondary | ICD-10-CM | POA: Diagnosis not present

## 2015-06-05 DIAGNOSIS — I272 Other secondary pulmonary hypertension: Secondary | ICD-10-CM | POA: Diagnosis present

## 2015-06-05 DIAGNOSIS — Z681 Body mass index (BMI) 19 or less, adult: Secondary | ICD-10-CM | POA: Diagnosis not present

## 2015-06-05 DIAGNOSIS — I480 Paroxysmal atrial fibrillation: Secondary | ICD-10-CM | POA: Diagnosis not present

## 2015-06-05 DIAGNOSIS — I4891 Unspecified atrial fibrillation: Secondary | ICD-10-CM

## 2015-06-05 DIAGNOSIS — M62830 Muscle spasm of back: Secondary | ICD-10-CM | POA: Diagnosis present

## 2015-06-05 DIAGNOSIS — N200 Calculus of kidney: Secondary | ICD-10-CM | POA: Diagnosis not present

## 2015-06-05 DIAGNOSIS — D62 Acute posthemorrhagic anemia: Secondary | ICD-10-CM | POA: Diagnosis present

## 2015-06-05 DIAGNOSIS — I5043 Acute on chronic combined systolic (congestive) and diastolic (congestive) heart failure: Secondary | ICD-10-CM | POA: Diagnosis not present

## 2015-06-05 DIAGNOSIS — Z79899 Other long term (current) drug therapy: Secondary | ICD-10-CM | POA: Diagnosis not present

## 2015-06-05 DIAGNOSIS — I447 Left bundle-branch block, unspecified: Secondary | ICD-10-CM | POA: Diagnosis present

## 2015-06-05 DIAGNOSIS — T80211A Bloodstream infection due to central venous catheter, initial encounter: Secondary | ICD-10-CM | POA: Diagnosis present

## 2015-06-05 DIAGNOSIS — M5440 Lumbago with sciatica, unspecified side: Secondary | ICD-10-CM | POA: Diagnosis not present

## 2015-06-05 DIAGNOSIS — I428 Other cardiomyopathies: Secondary | ICD-10-CM | POA: Diagnosis present

## 2015-06-05 DIAGNOSIS — D72829 Elevated white blood cell count, unspecified: Secondary | ICD-10-CM | POA: Diagnosis not present

## 2015-06-05 DIAGNOSIS — D649 Anemia, unspecified: Secondary | ICD-10-CM | POA: Diagnosis not present

## 2015-06-05 DIAGNOSIS — M4620 Osteomyelitis of vertebra, site unspecified: Secondary | ICD-10-CM | POA: Diagnosis not present

## 2015-06-05 DIAGNOSIS — I4581 Long QT syndrome: Secondary | ICD-10-CM | POA: Diagnosis present

## 2015-06-05 DIAGNOSIS — A419 Sepsis, unspecified organism: Secondary | ICD-10-CM | POA: Diagnosis not present

## 2015-06-05 DIAGNOSIS — A4159 Other Gram-negative sepsis: Secondary | ICD-10-CM | POA: Diagnosis present

## 2015-06-05 DIAGNOSIS — I5041 Acute combined systolic (congestive) and diastolic (congestive) heart failure: Secondary | ICD-10-CM | POA: Diagnosis not present

## 2015-06-05 DIAGNOSIS — A4101 Sepsis due to Methicillin susceptible Staphylococcus aureus: Secondary | ICD-10-CM | POA: Diagnosis present

## 2015-06-05 DIAGNOSIS — R5381 Other malaise: Secondary | ICD-10-CM | POA: Diagnosis not present

## 2015-06-05 DIAGNOSIS — Z95818 Presence of other cardiac implants and grafts: Secondary | ICD-10-CM

## 2015-06-05 DIAGNOSIS — I313 Pericardial effusion (noninflammatory): Secondary | ICD-10-CM

## 2015-06-05 DIAGNOSIS — Y848 Other medical procedures as the cause of abnormal reaction of the patient, or of later complication, without mention of misadventure at the time of the procedure: Secondary | ICD-10-CM | POA: Diagnosis present

## 2015-06-05 DIAGNOSIS — B999 Unspecified infectious disease: Secondary | ICD-10-CM | POA: Diagnosis not present

## 2015-06-05 DIAGNOSIS — I509 Heart failure, unspecified: Secondary | ICD-10-CM | POA: Diagnosis not present

## 2015-06-05 DIAGNOSIS — R918 Other nonspecific abnormal finding of lung field: Secondary | ICD-10-CM

## 2015-06-05 DIAGNOSIS — Z9119 Patient's noncompliance with other medical treatment and regimen: Secondary | ICD-10-CM | POA: Diagnosis not present

## 2015-06-05 DIAGNOSIS — M542 Cervicalgia: Secondary | ICD-10-CM | POA: Diagnosis present

## 2015-06-05 DIAGNOSIS — R0602 Shortness of breath: Secondary | ICD-10-CM | POA: Diagnosis not present

## 2015-06-05 DIAGNOSIS — T80219A Unspecified infection due to central venous catheter, initial encounter: Secondary | ICD-10-CM

## 2015-06-05 DIAGNOSIS — J90 Pleural effusion, not elsewhere classified: Secondary | ICD-10-CM | POA: Diagnosis not present

## 2015-06-05 DIAGNOSIS — K8020 Calculus of gallbladder without cholecystitis without obstruction: Secondary | ICD-10-CM | POA: Diagnosis not present

## 2015-06-05 DIAGNOSIS — M5441 Lumbago with sciatica, right side: Secondary | ICD-10-CM | POA: Diagnosis not present

## 2015-06-05 DIAGNOSIS — B9561 Methicillin susceptible Staphylococcus aureus infection as the cause of diseases classified elsewhere: Secondary | ICD-10-CM | POA: Diagnosis not present

## 2015-06-05 LAB — MRSA PCR SCREENING: MRSA BY PCR: NEGATIVE

## 2015-06-05 LAB — CBC
HEMATOCRIT: 32.7 % — AB (ref 36.0–46.0)
HEMOGLOBIN: 10.5 g/dL — AB (ref 12.0–15.0)
MCH: 27.1 pg (ref 26.0–34.0)
MCHC: 32.1 g/dL (ref 30.0–36.0)
MCV: 84.5 fL (ref 78.0–100.0)
Platelets: 209 10*3/uL (ref 150–400)
RBC: 3.87 MIL/uL (ref 3.87–5.11)
RDW: 15.8 % — ABNORMAL HIGH (ref 11.5–15.5)
WBC: 21.4 10*3/uL — AB (ref 4.0–10.5)

## 2015-06-05 LAB — SEDIMENTATION RATE: Sed Rate: 55 mm/hr — ABNORMAL HIGH (ref 0–22)

## 2015-06-05 LAB — LACTIC ACID, PLASMA
Lactic Acid, Venous: 1 mmol/L (ref 0.5–2.0)
Lactic Acid, Venous: 1 mmol/L (ref 0.5–2.0)

## 2015-06-05 LAB — COMPREHENSIVE METABOLIC PANEL
ALT: 25 U/L (ref 14–54)
ANION GAP: 11 (ref 5–15)
AST: 21 U/L (ref 15–41)
Albumin: 2.9 g/dL — ABNORMAL LOW (ref 3.5–5.0)
Alkaline Phosphatase: 130 U/L — ABNORMAL HIGH (ref 38–126)
BUN: 19 mg/dL (ref 6–20)
CHLORIDE: 105 mmol/L (ref 101–111)
CO2: 23 mmol/L (ref 22–32)
Calcium: 9.1 mg/dL (ref 8.9–10.3)
Creatinine, Ser: 1.09 mg/dL — ABNORMAL HIGH (ref 0.44–1.00)
GFR calc non Af Amer: 51 mL/min — ABNORMAL LOW (ref >60)
GFR, EST AFRICAN AMERICAN: 59 mL/min — AB (ref >60)
Glucose, Bld: 133 mg/dL — ABNORMAL HIGH (ref 65–99)
POTASSIUM: 4.6 mmol/L (ref 3.5–5.1)
SODIUM: 139 mmol/L (ref 135–145)
Total Bilirubin: 2.4 mg/dL — ABNORMAL HIGH (ref 0.3–1.2)
Total Protein: 6.7 g/dL (ref 6.5–8.1)

## 2015-06-05 LAB — PROTIME-INR
INR: 1.72 — AB (ref 0.00–1.49)
INR: 1.84 — AB (ref 0.00–1.49)
PROTHROMBIN TIME: 20.1 s — AB (ref 11.6–15.2)
PROTHROMBIN TIME: 21.2 s — AB (ref 11.6–15.2)

## 2015-06-05 LAB — C-REACTIVE PROTEIN: CRP: 18.8 mg/dL — ABNORMAL HIGH (ref <1.0)

## 2015-06-05 LAB — HEPARIN LEVEL (UNFRACTIONATED)

## 2015-06-05 LAB — BRAIN NATRIURETIC PEPTIDE: B NATRIURETIC PEPTIDE 5: 3619.4 pg/mL — AB (ref 0.0–100.0)

## 2015-06-05 LAB — PROCALCITONIN: PROCALCITONIN: 1.14 ng/mL

## 2015-06-05 MED ORDER — DIGOXIN 125 MCG PO TABS
0.0625 mg | ORAL_TABLET | Freq: Every day | ORAL | Status: DC
  Administered 2015-06-05: 0.625 mg via ORAL
  Administered 2015-06-06 – 2015-06-13 (×8): 0.0625 mg via ORAL
  Filled 2015-06-05 (×9): qty 1

## 2015-06-05 MED ORDER — OXYCODONE-ACETAMINOPHEN 5-325 MG PO TABS
1.0000 | ORAL_TABLET | ORAL | Status: DC | PRN
  Administered 2015-06-07 – 2015-06-13 (×11): 1 via ORAL
  Filled 2015-06-05 (×12): qty 1

## 2015-06-05 MED ORDER — WARFARIN - PHARMACIST DOSING INPATIENT: Freq: Every day | Status: DC

## 2015-06-05 MED ORDER — ACETAMINOPHEN 325 MG PO TABS: 650.0000 mg | ORAL_TABLET | Freq: Four times a day (QID) | ORAL | Status: DC | PRN

## 2015-06-05 MED ORDER — DEXTROSE 5 % IV SOLN
2.0000 g | INTRAVENOUS | Status: DC
  Administered 2015-06-05 – 2015-06-07 (×3): 2 g via INTRAVENOUS
  Filled 2015-06-05 (×3): qty 2

## 2015-06-05 MED ORDER — PIPERACILLIN-TAZOBACTAM 3.375 G IVPB 30 MIN
3.3750 g | INTRAVENOUS | Status: AC
  Administered 2015-06-05: 3.375 g via INTRAVENOUS
  Filled 2015-06-05: qty 50

## 2015-06-05 MED ORDER — VANCOMYCIN HCL IN DEXTROSE 1-5 GM/200ML-% IV SOLN
1000.0000 mg | INTRAVENOUS | Status: AC
  Administered 2015-06-05: 1000 mg via INTRAVENOUS
  Filled 2015-06-05: qty 200

## 2015-06-05 MED ORDER — FUROSEMIDE 10 MG/ML IJ SOLN
40.0000 mg | Freq: Two times a day (BID) | INTRAMUSCULAR | Status: DC
  Administered 2015-06-05 – 2015-06-07 (×6): 40 mg via INTRAVENOUS
  Filled 2015-06-05 (×6): qty 4

## 2015-06-05 MED ORDER — SODIUM CHLORIDE 0.9% FLUSH
3.0000 mL | Freq: Two times a day (BID) | INTRAVENOUS | Status: DC
  Administered 2015-06-08 – 2015-06-11 (×6): 3 mL via INTRAVENOUS

## 2015-06-05 MED ORDER — AMIODARONE HCL 100 MG PO TABS
100.0000 mg | ORAL_TABLET | Freq: Every day | ORAL | Status: DC
  Administered 2015-06-06 – 2015-06-13 (×8): 100 mg via ORAL
  Filled 2015-06-05 (×9): qty 1

## 2015-06-05 MED ORDER — POTASSIUM CHLORIDE CRYS ER 20 MEQ PO TBCR
20.0000 meq | EXTENDED_RELEASE_TABLET | Freq: Every day | ORAL | Status: AC
  Administered 2015-06-06: 20 meq via ORAL
  Filled 2015-06-05: qty 1

## 2015-06-05 MED ORDER — SODIUM CHLORIDE 0.9 % IV SOLN: INTRAVENOUS | Status: DC

## 2015-06-05 MED ORDER — ENSURE ENLIVE PO LIQD
237.0000 mL | Freq: Two times a day (BID) | ORAL | Status: DC
  Filled 2015-06-05: qty 237

## 2015-06-05 MED ORDER — VANCOMYCIN HCL IN DEXTROSE 750-5 MG/150ML-% IV SOLN
750.0000 mg | INTRAVENOUS | Status: DC
  Administered 2015-06-06: 750 mg via INTRAVENOUS
  Filled 2015-06-05 (×3): qty 150

## 2015-06-05 MED ORDER — IOHEXOL 300 MG/ML  SOLN
75.0000 mL | Freq: Once | INTRAMUSCULAR | Status: AC | PRN
  Administered 2015-06-05: 75 mL via INTRAVENOUS

## 2015-06-05 MED ORDER — MORPHINE SULFATE (PF) 2 MG/ML IV SOLN
1.0000 mg | INTRAVENOUS | Status: DC | PRN
  Administered 2015-06-05 – 2015-06-13 (×5): 1 mg via INTRAVENOUS
  Filled 2015-06-05 (×5): qty 1

## 2015-06-05 MED ORDER — METHOCARBAMOL 500 MG PO TABS
750.0000 mg | ORAL_TABLET | Freq: Once | ORAL | Status: AC
  Administered 2015-06-05: 750 mg via ORAL
  Filled 2015-06-05: qty 2

## 2015-06-05 MED ORDER — PIPERACILLIN-TAZOBACTAM 3.375 G IVPB: 3.3750 g | Freq: Three times a day (TID) | INTRAVENOUS | Status: DC

## 2015-06-05 MED ORDER — ACETAMINOPHEN 650 MG RE SUPP: 650.0000 mg | Freq: Four times a day (QID) | RECTAL | Status: DC | PRN

## 2015-06-05 MED ORDER — SODIUM CHLORIDE 0.9% FLUSH
10.0000 mL | INTRAVENOUS | Status: DC | PRN
  Administered 2015-06-05: 10 mL
  Filled 2015-06-05: qty 40

## 2015-06-05 MED ORDER — HYDROMORPHONE HCL 1 MG/ML IJ SOLN
0.5000 mg | INTRAMUSCULAR | Status: DC | PRN
  Administered 2015-06-05: 0.5 mg via INTRAVENOUS
  Filled 2015-06-05: qty 1

## 2015-06-05 MED ORDER — WARFARIN SODIUM 7.5 MG PO TABS
7.5000 mg | ORAL_TABLET | Freq: Once | ORAL | Status: AC
  Administered 2015-06-05: 7.5 mg via ORAL
  Filled 2015-06-05 (×2): qty 1

## 2015-06-05 MED ORDER — MAGNESIUM OXIDE 400 (241.3 MG) MG PO TABS
200.0000 mg | ORAL_TABLET | Freq: Two times a day (BID) | ORAL | Status: DC
  Administered 2015-06-05 – 2015-06-13 (×15): 200 mg via ORAL
  Filled 2015-06-05 (×4): qty 1
  Filled 2015-06-05: qty 0.5
  Filled 2015-06-05 (×2): qty 1
  Filled 2015-06-05 (×2): qty 0.5
  Filled 2015-06-05 (×11): qty 1

## 2015-06-05 MED ORDER — MILRINONE IN DEXTROSE 20 MG/100ML IV SOLN
0.2500 ug/kg/min | INTRAVENOUS | Status: DC
  Administered 2015-06-05 – 2015-06-12 (×8): 0.25 ug/kg/min via INTRAVENOUS
  Filled 2015-06-05 (×9): qty 100

## 2015-06-05 MED ORDER — HEPARIN (PORCINE) IN NACL 100-0.45 UNIT/ML-% IJ SOLN
1450.0000 [IU]/h | INTRAMUSCULAR | Status: DC
  Administered 2015-06-05: 800 [IU]/h via INTRAVENOUS
  Administered 2015-06-06: 1200 [IU]/h via INTRAVENOUS
  Filled 2015-06-05 (×3): qty 250

## 2015-06-05 MED ORDER — POLYETHYLENE GLYCOL 3350 17 G PO PACK
17.0000 g | PACK | Freq: Every day | ORAL | Status: DC
  Administered 2015-06-10 – 2015-06-11 (×2): 17 g via ORAL
  Filled 2015-06-05 (×6): qty 1

## 2015-06-05 MED ORDER — HYDROMORPHONE HCL 1 MG/ML IJ SOLN
0.5000 mg | INTRAMUSCULAR | Status: DC | PRN
  Administered 2015-06-05 – 2015-06-11 (×10): 0.5 mg via INTRAVENOUS
  Filled 2015-06-05 (×11): qty 1

## 2015-06-05 NOTE — ED Notes (Signed)
Patients Milrinone pump stopped and milrinone drip placed on hospital pump . Patients pump placed in belonging bag with her clothing

## 2015-06-05 NOTE — Progress Notes (Signed)
CRITICAL VALUE ALERT  Critical value received:  Anaerobic blood culture / 2nd organism =  Gm. Negative rods  Date of notification:  06/05/2015  Time of notification:  1740  Critical value read back:  yes  Nurse who received alert:  Marge K. Doreen Salvage  MD notified (1st page):  Dr. Jerral Ralph  Time of first page:  1745  MD notified (2nd page):  Time of second page:  Responding MD:  Dr. Jerral Ralph  Time MD responded:  [phone]

## 2015-06-05 NOTE — ED Notes (Signed)
Patient wanted husband called , called [phone] left message for husband and gave him patient's room number.

## 2015-06-05 NOTE — Progress Notes (Signed)
ANTICOAGULATION CONSULT NOTE - Initial Consult  Pharmacy Consult for Coumadin Indication: afib  No Known Allergies  Patient Measurements: Weight: 103 lb (46.72 kg)  Vital Signs: BP: 112/89 mmHg (03/12 0931) Pulse Rate: 96 (03/12 0931)  Labs:  Recent Labs  06/04/15 2048 06/05/15 0330  HGB 11.4* 10.5*  HCT 35.5* 32.7*  PLT 235 209  LABPROT 20.1* 21.2*  INR 1.72* 1.84*  CREATININE 1.07* 1.09*    Estimated Creatinine Clearance: 36.4 mL/min (by C-G formula based on Cr of 1.09).   Temp (24hrs), Avg:98.5 F (36.9 C), Min:98.5 F (36.9 C), Max:98.5 F (36.9 C)   Recent Labs Lab 06/04/15 2048 06/04/15 2257 06/05/15 0330 06/05/15 0740  WBC 24.9*  --  21.4*  --   CREATININE 1.07*  --  1.09*  --   LATICACIDVEN  --  1.93  --  1.0    Estimated Creatinine Clearance: 36.4 mL/min (by C-G formula based on Cr of 1.09).    No Known Allergies  Assessment:  70 y.o. F presents with spasms. Pt on coumadin PTA for afib. AM INR is sub-therapeutic at 1.84. Last dose of Coumadin was on 3/10.   Home dose: 6mg  daily except for 4mg  on M/W/F. CBC stable.   Goal of Therapy:   Plan: -Give Coumadin 7.5 mg once today  -Monitor daily PT/INR   Vinnie Level, PharmD., BCPS Clinical Pharmacist Pager [phone]

## 2015-06-05 NOTE — Progress Notes (Signed)
ANTICOAGULATION CONSULT NOTE - Follow Up Consult  Pharmacy Consult for heparin bridge Indication: atrial fibrillation and history of LV thrombus  No Known Allergies  Patient Measurements: Weight: 103 lb (46.72 kg) Heparin Dosing Weight:46  Vital Signs: BP: 101/70 mmHg (03/12 1350) Pulse Rate: 95 (03/12 1350)  Labs:  Recent Labs  06/04/15 2048 06/05/15 0330  HGB 11.4* 10.5*  HCT 35.5* 32.7*  PLT 235 209  LABPROT 20.1* 21.2*  INR 1.72* 1.84*  CREATININE 1.07* 1.09*    Estimated Creatinine Clearance: 36.4 mL/min (by C-G formula based on Cr of 1.09).  Assessment: 70 year old female with afib on chronic coumadin (INR 1.8) and history of LV thrombus. New orders for heparin bridge until INR therapeutic. Will not bolus since INR is elevated.  Goal of Therapy:  INR 2-3 Heparin level 0.3-0.7 units/ml Monitor platelets by anticoagulation protocol: Yes   Plan:  Start heparin at 800 units/hr Check HL in 6 hours then daily CBC daily Warfarin 7.5 previously ordered for tonight  Sheppard Coil PharmD., BCPS Clinical Pharmacist Pager [phone] 06/05/2015 2:45 PM

## 2015-06-05 NOTE — Progress Notes (Signed)
ANTICOAGULATION/ANTIBIOTIC CONSULT NOTE - Initial Consult  Pharmacy Consult for Vancomycin and Zosyn and Coumadin Indication: sepsis and afib  No Known Allergies  Patient Measurements: Weight: 103 lb (46.72 kg)  Vital Signs: Temp: 98.5 F (36.9 C) (03/11 1756) Temp Source: Oral (03/11 1756) BP: 127/97 mmHg (03/12 0130) Pulse Rate: 114 (03/12 0130)  Labs:  Recent Labs  06/04/15 2048  HGB 11.4*  HCT 35.5*  PLT 235  CREATININE 1.07*    Estimated Creatinine Clearance: 37.1 mL/min (by C-G formula based on Cr of 1.07).   Temp (24hrs), Avg:98.5 F (36.9 C), Min:98.5 F (36.9 C), Max:98.5 F (36.9 C)   Recent Labs Lab 06/04/15 2048 06/04/15 2257  WBC 24.9*  --   CREATININE 1.07*  --   LATICACIDVEN  --  1.93    Estimated Creatinine Clearance: 37.1 mL/min (by C-G formula based on Cr of 1.07).    No Known Allergies  Antimicrobials this admission: 3/12 Vanc >>  3/12 Zosyn >>   Dose adjustments this admission: n/a  Microbiology results: 3/11 BCx:   Assessment:  70 y.o. F presents with spasms.  AC: Pt on coumadin PTA for afib. INR ordered with a.m. labs. Home dose: 6mg  daily except for 4mg  on M/W/F. CBC stable.  ID: Vancomycin and Zosyn for sepsis. Est CrCl 37 ml/min. WBC elevated to 24.9.  Goal of Therapy:   Plan: Daily INR - will await a.m. INR to dose coumadin Zosyn 3.375gm IV now over 30 min then 3.375gm IV q8h - subsequent doses over 4 hours Vancomycin 1gm IV now then 750mg  IV q24 Will f/u micro data, renal function, and pt's clinical condition Vanc trough prn  Thank you for allowing pharmacy to be a part of this patient's care.  Lavonia Dana 06/05/2015 1:48 AM

## 2015-06-05 NOTE — ED Notes (Signed)
Patient was taken to MRI  Had to be returned to ed wasn't able to lay flat states she cant breath

## 2015-06-05 NOTE — H&P (Signed)
[facility] History and Physical  Katrina Peck VOJ:[id number] DOB: Sep 29, 1945 DOA: 06/04/2015  Referring physician: ED physician PCP: Rachell Cipro, MD  Specialists:   Chief Complaint: Back pain and back spasm  HPI: Katrina Peck is a 70 y.o. female with PMH of hypertension, systolic congestive heart failure with a year for 20% on milrinone drip,, anemia, PAF on Coumadin, anemia, who presents with back pain and back spasm.  Patient reports that she started having severe lower back pain and back spasm 3 days ago, which is initially located in the lower back in the midline and also slightly to the left side of the paraspinal area. It is constant, 10 out of 10 in severity. It has progressed to have involved thoracic spine and the neck. She has severe neck pain over the back of her neck and also left side of neck. Patient reports that she has right arm numbness and weakness, which started when she had PICC line placement in her right arm, which was removed because of her right arm pain and numbness. Patient does not have chest pain, cough, abdominal pain, diarrhea, symptoms of UTI. She has chronic shortness of breath, which has not changed significantly.  In ED, patient was found to have lactase 1.93, troponin negative, BNP 3619, negative urinalysis, WBC 24.9, temperature normal, tachycardia, tachypnea, creatinine 1.07. CT abdomen/pelvis is negative for acute abnormalities. Patient is admitted to inpatient for further evaluation treatment.  EKG: Independently reviewed. QTC 534, widening QRS wave, T-wave inversion in V5-V6.  Where does patient live?   At home  Can patient participate in ADLs?  Yes     Barely     None  Little     Some   Review of Systems:   General: no fevers, chills, no changes in body weight, has poor appetite, has fatigue HEENT: no blurry vision, hearing changes or sore throat Pulm: has dyspnea, no coughing, wheezing CV: no chest pain, no palpitations Abd: no  nausea, vomiting, abdominal pain, diarrhea, constipation GU: no dysuria, burning on urination, increased urinary frequency, hematuria  Ext: no leg edema Neuro: no unilateral weakness, numbness, or tingling, no vision change or hearing loss Skin: no rash MSK: has whole back pain from neck to lower back. Heme: No easy bruising.  Travel history: No recent long distant travel.  Allergy: No Known Allergies  Past Medical History  Diagnosis Date  . Anemia   . Hypertension   . Blood transfusion without reported diagnosis   . Chronic systolic CHF (congestive heart failure) ([place])     a. 06/2014 Echo: EF 20%.  Marland Kitchen NICM (nonischemic cardiomyopathy) ([place])     a. 06/2014 Echo: EF 20%, diff HK, mild MR, mildly dil LA/RA, mildly reduced RV fxn;  b. 06/2014 Cath: nl cors.  . Pericardial effusion     a. 06/2014 Large effusion, no tamponade phys.    Past Surgical History  Procedure Laterality Date  . No past surgeries    . Left and right heart catheterization with coronary angiogram N/A 07/23/2014    Procedure: LEFT AND RIGHT HEART CATHETERIZATION WITH CORONARY ANGIOGRAM;  Surgeon: Larey Dresser, MD;  Location: [facility] CATH LAB;  Service: Cardiovascular;  Laterality: N/A;  . Subxyphoid pericardial window N/A 07/26/2014    Procedure: SUBXYPHOID PERICARDIAL WINDOW;  Surgeon: Grace Isaac, MD;  Location: [place];  Service: Thoracic;  Laterality: N/A;  . Tee without cardioversion N/A 07/26/2014    Procedure: TRANSESOPHAGEAL ECHOCARDIOGRAM (TEE);  Surgeon: Grace Isaac, MD;  Location: [facility]  OR;  Service: Thoracic;  Laterality: N/A;  . Pleural effusion drainage N/A 07/26/2014    Procedure: DRAINAGE OF PERICARDIAL EFFUSION;  Surgeon: Grace Isaac, MD;  Location: [place];  Service: Thoracic;  Laterality: N/A;  . Cardiac catheterization N/A 02/07/2015    Procedure: Right Heart Cath;  Surgeon: Larey Dresser, MD;  Location: [place] CV LAB;  Service: Cardiovascular;  Laterality: N/A;    Social History:   reports that she has never smoked. She has never used smokeless tobacco. She reports that she does not drink alcohol or use illicit drugs.  Family History:  Family History  Problem Relation Age of Onset  . Hypertension Sister   . Diabetes Maternal Grandmother   . Cancer Mother     died young (pt was only in [employer] @ the time)  . Other Father     died in his 19's.     Prior to Admission medications   Medication Sig Start Date End Date Taking? Authorizing Provider  amiodarone (PACERONE) 100 MG tablet Take 1 tablet (100 mg total) by mouth daily. 11/30/14  Yes Amy D Clegg, NP  digoxin 62.5 MCG TABS Take 0.0625 mg by mouth daily. 02/09/15  Yes Amy D Clegg, NP  Magnesium Oxide 200 MG TABS Take 1 tablet (200 mg total) by mouth 2 (two) times daily. 04/26/15  Yes Shirley Friar, PA-C  milrinone [facility]) 20 MG/100ML SOLN infusion Inject 11.6 mcg/min into the vein continuous. 03/28/15  Yes Debbe Odea, MD  potassium chloride SA (K-DUR,KLOR-CON) 20 MEQ tablet Take 2 tablets (40 mEq total) by mouth daily. 02/09/15  Yes Amy D Clegg, NP  spironolactone (ALDACTONE) 25 MG tablet Take 0.5 tablets (12.5 mg total) by mouth daily. 02/09/15  Yes Amy D Clegg, NP  torsemide (DEMADEX) 20 MG tablet Take 1 tablet (20 mg total) by mouth daily. 05/05/15  Yes Larey Dresser, MD  warfarin (COUMADIN) 2 MG tablet Take as directed by [facility] Patient taking differently: Take 4-6 mg by mouth daily at 6 PM. Takes 57m on mon, wed and fri  Takes 651mall other days 04/26/15  Yes DaLarey DresserMD  feeding supplement, ENSURE ENLIVE, (ENSURE ENLIVE) LIQD Take 237 mLs by mouth 2 (two) times daily between meals. 03/28/15   SaDebbe OdeaMD    Physical Exam: Filed Vitals:   06/05/15 0630 06/05/15 0645 06/05/15 0700 06/05/15 0715  BP: 98/72 96/67 96/71  96/68  Pulse: 98 97 96 91  Temp:      TempSrc:      Resp: 14 13 18 13   Weight:      SpO2: 96% 96% 97% 96%   General: Not in acute distress HEENT:        Eyes: PERRL, EOMI, no scleral icterus.       ENT: No discharge from the ears and nose, no pharynx injection, no tonsillar enlargement.        Neck: positive JVD, no bruit, no mass felt. Heme: No neck lymph node enlargement. Cardiac: S1/S2, RRR, No murmurs, No gallops or rubs. Pulm: No rales, wheezing, rhonchi or rubs. Has port-A line over R upper chest with clean surroundings. Abd: Soft, nondistended, nontender, no rebound pain, no organomegaly, BS present. Ext: No pitting leg edema bilaterally. 2+DP/PT pulse bilaterally. Musculoskeletal: No joint deformities, No joint redness or warmth, no limitation of ROM in spin. Skin: No rashes.  Neuro: Alert, oriented X3, cranial nerves II-XII grossly intact, moves all extremities normally. Muscle strength 4/5 in right arm  and 5/5 in other extremities, sensation to light touch intact. Knee reflex 1+ bilaterally. Negative Babinski's sign.  Psych: Patient is not psychotic, no suicidal or hemocidal ideation.  Labs on Admission:  Basic Metabolic Panel:  Recent Labs Lab 06/04/15 2048 06/05/15 0330  NA 142 139  K 4.6 4.6  CL 103 105  CO2 20* 23  GLUCOSE 129* 133*  BUN 19 19  CREATININE 1.07* 1.09*  CALCIUM 9.8 9.1   Liver Function Tests:  Recent Labs Lab 06/04/15 2048 06/05/15 0330  AST 21 21  ALT 28 25  ALKPHOS 146* 130*  BILITOT 2.7* 2.4*  PROT 7.9 6.7  ALBUMIN 3.2* 2.9*   No results for input(s): LIPASE, AMYLASE in the last 168 hours. No results for input(s): AMMONIA in the last 168 hours. CBC:  Recent Labs Lab 06/04/15 2048 06/05/15 0330  WBC 24.9* 21.4*  HGB 11.4* 10.5*  HCT 35.5* 32.7*  MCV 84.1 84.5  PLT 235 209   Cardiac Enzymes: No results for input(s): CKTOTAL, CKMB, CKMBINDEX, TROPONINI in the last 168 hours.  BNP (last 3 results)  Recent Labs  02/03/15 2310 03/22/15 2015 06/05/15 0330  BNP 4003.4* >4500.0* 3619.4*    ProBNP (last 3 results) No results for input(s): PROBNP in the last 8760  hours.  CBG: No results for input(s): GLUCAP in the last 168 hours.  Radiological Exams on Admission: Ct Abdomen Pelvis W Contrast  06/05/2015  CLINICAL DATA:  Back spasms.  Leukocytosis. EXAM: CT ABDOMEN AND PELVIS WITH CONTRAST TECHNIQUE: Multidetector CT imaging of the abdomen and pelvis was performed using the standard protocol following bolus administration of intravenous contrast. CONTRAST:  25m OMNIPAQUE IOHEXOL 300 MG/ML  SOLN COMPARISON:  None. FINDINGS: Lower chest and abdominal wall: Chronic cardiomegaly. Remote subendocardial left ventricular infarct, greatest along the anterior wall. Chronic large pericardial effusion when compared to previous CT. Chronic small right pleural effusion with atelectasis or scar. Hepatobiliary: Liver enhancement pattern suggests passive congestion. No focal lesion.Cholelithiasis without evidence of acute cholecystitis. No duct enlargement Pancreas: Unremarkable. Spleen: Unremarkable. Adrenals/Urinary Tract: Negative adrenals. Bilateral renal cortical thinning. Small nonobstructive bilateral renal calculi. Renal sinus cysts. Chronic hemorrhagic or proteinaceous cyst in the upper pole left kidney measuring 13 mm, also seen on April 2016 chest CT. Unremarkable decompressed bladder. Reproductive:Multiple uterine fibroids measuring up to 23 mm. Stomach/Bowel: No obstruction. Colonic diverticulosis without active inflammation. No evidence of appendicitis. Vascular/Lymphatic: No acute vascular abnormality. No mass or adenopathy. Peritoneal: Small ascites Musculoskeletal: No acute abnormalities. IMPRESSION: 1. No acute finding. 2. Remote left ventricular infarct with chronic cardiomegaly. Chronic large pericardial effusion and small right pleural effusion. 3. Passive congestion of the liver. 4. Cholelithiasis and nephrolithiasis. 5. Colonic diverticulosis. Electronically Signed   By: JMonte FantasiaM.D.   On: 06/05/2015 01:33   Dg Chest Port 1 View  06/04/2015  CLINICAL  DATA:  Shortness of breath EXAM: PORTABLE CHEST 1 VIEW COMPARISON:  03/22/2015 chest radiograph. FINDINGS: Right internal jugular central venous catheter terminates at the cavoatrial junction. Stable cardiomediastinal silhouette with moderate cardiomegaly. No pneumothorax. Small bilateral pleural effusions. Mild pulmonary edema. Mild bibasilar atelectasis. There is a new subcentimeter nodular density in the peripheral left mid lung. IMPRESSION: 1. Moderate cardiomegaly and mild pulmonary edema, in keeping with mild congestive heart failure. 2. Small bilateral pleural effusions and mild bibasilar atelectasis. 3. New subcentimeter nodular density in the peripheral left mid lung, cannot exclude a pulmonary nodule. Recommend correlation with chest CT on a short term outpatient basis. Electronically Signed  By: Ilona Sorrel M.D.   On: 06/04/2015 20:44    Assessment/Plan Principal Problem:   Back pain Active Problems:   Essential hypertension   Pericardial effusion   Chronic systolic heart failure (HCC)   Paroxysmal atrial fibrillation (HCC)   Protein-calorie malnutrition, severe (HCC)   Long term (current) use of anticoagulants [Z79.01]   Sepsis (HCC)   Back spasm   Neck pain  Back pain: pt has whole back pain from neck to lower back. Also has leukocytosis and sepsis with leukocytosis, tachycardia and tachypnea, indicating possible infection. Concerning for discitis.  -will admit to SDU -start IV vanco and zosyn -Bx x 2 -MRI of C, T and L spin -ESR and CRP. -Pain control: When necessary morphine, Percocet  Sepsis due to possible discitis: Patient with critical for sepsis with leukocytosis, tachycardia and tachypnea. Lactate is normal. Hemodynamically stable. Due to elevated BNP and a very low EF 20%, I will hold IV fluids now. -on IV abx as above -will get Procalcitonin and trend lactic acid levels per sepsis protocol. -hold off IVF  Chronic systolic congestive heart failure: 2-D echo  03/23/15 showed EF 20%. Patient is on milrinone drip. Managed by Dr. Aundra Dubin. Patient has elevated BNP, and the posterior JVD, indicating worsening congestive heart failure. Due to currently sepsis, will hold diuretics. -continue milrinone -Hold her Lasix and spironolactone until sepsis resolves  HTN: Blood pressure soft -Hold lasix and spironolactone  Atrial Fibrillation: CHA2DS2-VASc Score is 5, needs oral anticoagulation. Patient is on Coumadin. Heart rate is at 110-120. -continue amiodarone, digitoxin -Continue Coumadin per pharmacy  Protein-calorie malnutrition, severe ([address]): -Nutrition supplement   DVT ppx: on Coumadin  Code Status: Full code Family Communication:  Yes, patient's husband  at bed side Disposition Plan: Admit to inpatient   Date of Service 06/05/2015    Ivor Costa [facility] Pager [phone]  If 7PM-7AM, please contact night-coverage [email or web address] Password [facility] 06/05/2015, 8:05 AM

## 2015-06-05 NOTE — Consult Note (Signed)
[facility]  Date of Admission:  06/04/2015  Date of Consult:  06/05/2015  Reason for Consult: Leukocyctosis Referring Physician: Sloan Leiter  Impression/Recommendation Leukocytosis Indwelling line Severe Back Pain CHF with home milrinone Chronic Pericardial effusion LV Thrombus Afib on coumadin Severe protein calorie malnutrition Pulmonary nodule on CXR  Would Await BCx Consider MRI with sedation (cervical to sacral) Changer her zosyn to ceftriaxone Consider repeat TEE Consider removing her line if any change in hemodynamics, BCx+ Consider imaging her neck  Comment- Her line appears to be the logical source of possible pain in her back (discitis, infection?). Would also quesry if she has jugular thrombus with her degree of pain on L.   Thank you so much for this interesting consult,   Bobby Rumpf (pager) [phone] [email or web address]  DORRIE COCUZZA is an 70 y.o. female.  HPI: 70 yo F with congestive heart failure/non-isch CM, on home milrinone, comes to hospital with 3 days of severe back pain and chills. She was  found to have WBC 24.9 and she was afebrile. She was started on vanco and zosyn. Her back does not permit her to lie flat to have MRI. BCx pending.  She states she has had her line in only a few weeks (she has some pain with laying on it but no problems with infusion).   Past Medical History  Diagnosis Date  . Anemia   . Hypertension   . Blood transfusion without reported diagnosis   . Chronic systolic CHF (congestive heart failure) ([place])     a. 06/2014 Echo: EF 20%.  Marland Kitchen NICM (nonischemic cardiomyopathy) ([place])     a. 06/2014 Echo: EF 20%, diff HK, mild MR, mildly dil LA/RA, mildly reduced RV fxn;  b. 06/2014 Cath: nl cors.  . Pericardial effusion     a. 06/2014 Large effusion, no tamponade phys.    Past Surgical History  Procedure Laterality Date  . No past surgeries    . Left and right heart catheterization with  coronary angiogram N/A 07/23/2014    Procedure: LEFT AND RIGHT HEART CATHETERIZATION WITH CORONARY ANGIOGRAM;  Surgeon: Larey Dresser, MD;  Location: [facility] CATH LAB;  Service: Cardiovascular;  Laterality: N/A;  . Subxyphoid pericardial window N/A 07/26/2014    Procedure: SUBXYPHOID PERICARDIAL WINDOW;  Surgeon: Grace Isaac, MD;  Location: [facility];  Service: Thoracic;  Laterality: N/A;  . Tee without cardioversion N/A 07/26/2014    Procedure: TRANSESOPHAGEAL ECHOCARDIOGRAM (TEE);  Surgeon: Grace Isaac, MD;  Location: [facility];  Service: Thoracic;  Laterality: N/A;  . Pleural effusion drainage N/A 07/26/2014    Procedure: DRAINAGE OF PERICARDIAL EFFUSION;  Surgeon: Grace Isaac, MD;  Location: [facility];  Service: Thoracic;  Laterality: N/A;  . Cardiac catheterization N/A 02/07/2015    Procedure: Right Heart Cath;  Surgeon: Larey Dresser, MD;  Location: [facility] CV LAB;  Service: Cardiovascular;  Laterality: N/A;     No Known Allergies  Medications:  Scheduled: . amiodarone  100 mg Oral Daily  . digoxin  0.0625 mg Oral Daily  . feeding supplement (ENSURE ENLIVE)  237 mL Oral BID BM  . furosemide  40 mg Intravenous BID  . magnesium oxide  200 mg Oral BID  . polyethylene glycol  17 g Oral Daily  . potassium chloride  20 mEq Oral Daily  . sodium chloride flush  3 mL Intravenous Q12H  . [START ON 06/06/2015] vancomycin  750 mg Intravenous Q24H  . warfarin  7.5  mg Oral ONCE-1800  . Warfarin - Pharmacist Dosing Inpatient   Does not apply q1800    Abtx:  Anti-infectives    Start     Dose/Rate Route Frequency Ordered Stop   06/06/15 0400  vancomycin (VANCOCIN) IVPB 750 mg/150 ml premix     750 mg 150 mL/hr over 60 Minutes Intravenous Every 24 hours 06/05/15 0302     06/05/15 1400  piperacillin-tazobactam (ZOSYN) IVPB 3.375 g     3.375 g 12.5 mL/hr over 240 Minutes Intravenous 3 times per day 06/05/15 0302     06/05/15 0315  piperacillin-tazobactam (ZOSYN) IVPB 3.375 g     3.375  g 100 mL/hr over 30 Minutes Intravenous STAT 06/05/15 0302 06/05/15 0610   06/05/15 0315  vancomycin (VANCOCIN) IVPB 1000 mg/200 mL premix     1,000 mg 200 mL/hr over 60 Minutes Intravenous NOW 06/05/15 0302 06/05/15 0533      Total days of antibiotics: 1 vanco/zosyn          Social History:  reports that she has never smoked. She has never used smokeless tobacco. She reports that she does not drink alcohol or use illicit drugs.  Family History  Problem Relation Age of Onset  . Hypertension Sister   . Diabetes Maternal Grandmother   . Cancer Mother     died young (pt was only in [employer] @ the time)  . Other Father     died in his 16's.    General ROS: +chills, no fever, +fatigue, +DOE, +LE edema, no cough, no dysuria, no change in BM, see hPI.   Blood pressure 112/89, pulse 96, temperature 98.5 F (36.9 C), temperature source Oral, resp. rate 16, weight 46.72 kg (103 lb), SpO2 98 %. General appearance: alert, cooperative and no distress Eyes: negative findings: conjunctivae and sclerae normal and pupils equal, round, reactive to light and accomodation Throat: normal findings: oropharynx pink & moist without lesions or evidence of thrush and missing teeth. no submentla pain.  Neck: no adenopathy, supple, symmetrical, trachea midline and tenderness on L side Lungs: diminished breath sounds bilaterally and R chest line site is clean, no erythema, no d/c.  Heart: regular rate and rhythm Abdomen: normal findings: bowel sounds normal and soft, non-tender Extremities: edema 2+ Skin: change of chronic venous insuff in LE, no skni breakdown.   Back is tender from cervical spine to lumbar spine.    Results for orders placed or performed during the hospital encounter of 06/04/15 (from the past 48 hour(s))  CBC     Status: Abnormal   Collection Time: 06/04/15  8:48 PM  Result Value Ref Range   WBC 24.9 (H) 4.0 - 10.5 K/uL   RBC 4.22 3.87 - 5.11 MIL/uL   Hemoglobin 11.4 (L) 12.0 -  15.0 g/dL   HCT 35.5 (L) 36.0 - 46.0 %   MCV 84.1 78.0 - 100.0 fL   MCH 27.0 26.0 - 34.0 pg   MCHC 32.1 30.0 - 36.0 g/dL   RDW 15.7 (H) 11.5 - 15.5 %   Platelets 235 150 - 400 K/uL  Comprehensive metabolic panel     Status: Abnormal   Collection Time: 06/04/15  8:48 PM  Result Value Ref Range   Sodium 142 135 - 145 mmol/L   Potassium 4.6 3.5 - 5.1 mmol/L   Chloride 103 101 - 111 mmol/L   CO2 20 (L) 22 - 32 mmol/L   Glucose, Bld 129 (H) 65 - 99 mg/dL   BUN 19 6 -  20 mg/dL   Creatinine, Ser 1.07 (H) 0.44 - 1.00 mg/dL   Calcium 9.8 8.9 - 10.3 mg/dL   Total Protein 7.9 6.5 - 8.1 g/dL   Albumin 3.2 (L) 3.5 - 5.0 g/dL   AST 21 15 - 41 U/L   ALT 28 14 - 54 U/L   Alkaline Phosphatase 146 (H) 38 - 126 U/L   Total Bilirubin 2.7 (H) 0.3 - 1.2 mg/dL   GFR calc non Af Amer 52 (L) >60 mL/min   GFR calc Af Amer >60 >60 mL/min    Comment: (NOTE) The eGFR has been calculated using the CKD EPI equation. This calculation has not been validated in all clinical situations. eGFR's persistently <60 mL/min signify possible Chronic Kidney Disease.    Anion gap 19 (H) 5 - 15  Protime-INR     Status: Abnormal   Collection Time: 06/04/15  8:48 PM  Result Value Ref Range   Prothrombin Time 20.1 (H) 11.6 - 15.2 seconds   INR 1.72 (H) 0.00 - 1.49  I-Stat Troponin, ED (not at [facility])     Status: None   Collection Time: 06/04/15  8:59 PM  Result Value Ref Range   Troponin i, poc 0.04 0.00 - 0.08 ng/mL   Comment 3            Comment: Due to the release kinetics of cTnI, a negative result within the first hours of the onset of symptoms does not rule out myocardial infarction with certainty. If myocardial infarction is still suspected, repeat the test at appropriate intervals.   Urinalysis, Routine w reflex microscopic (not at [facility])     Status: Abnormal   Collection Time: 06/04/15 10:34 PM  Result Value Ref Range   Color, Urine AMBER (A) YELLOW    Comment: BIOCHEMICALS MAY BE AFFECTED BY COLOR    APPearance CLOUDY (A) CLEAR   Specific Gravity, Urine 1.028 1.005 - 1.030   pH 5.0 5.0 - 8.0   Glucose, UA NEGATIVE NEGATIVE mg/dL   Hgb urine dipstick LARGE (A) NEGATIVE   Bilirubin Urine NEGATIVE NEGATIVE   Ketones, ur 15 (A) NEGATIVE mg/dL   Protein, ur >300 (A) NEGATIVE mg/dL   Nitrite NEGATIVE NEGATIVE   Leukocytes, UA NEGATIVE NEGATIVE  Urine microscopic-add on     Status: Abnormal   Collection Time: 06/04/15 10:34 PM  Result Value Ref Range   Squamous Epithelial / LPF 0-5 (A) NONE SEEN   WBC, UA 0-5 0 - 5 WBC/hpf   RBC / HPF 6-30 0 - 5 RBC/hpf   Bacteria, UA FEW (A) NONE SEEN   Casts GRANULAR CAST (A) NEGATIVE    Comment: HYALINE CASTS   Urine-Other MUCOUS PRESENT   I-Stat CG4 Lactic Acid, ED     Status: None   Collection Time: 06/04/15 10:57 PM  Result Value Ref Range   Lactic Acid, Venous 1.93 0.5 - 2.0 mmol/L  Sedimentation rate     Status: Abnormal   Collection Time: 06/05/15 12:31 AM  Result Value Ref Range   Sed Rate 55 (H) 0 - 22 mm/hr  C-reactive protein     Status: Abnormal   Collection Time: 06/05/15 12:31 AM  Result Value Ref Range   CRP 18.8 (H) <1.0 mg/dL  Procalcitonin     Status: None   Collection Time: 06/05/15  3:24 AM  Result Value Ref Range   Procalcitonin 1.14 ng/mL    Comment:        Interpretation: PCT > 0.5 ng/mL and <= 2  ng/mL: Systemic infection (sepsis) is possible, but other conditions are known to elevate PCT as well. (NOTE)         ICU PCT Algorithm               Non ICU PCT Algorithm    ----------------------------     ------------------------------         PCT < 0.25 ng/mL                 PCT < 0.1 ng/mL     Stopping of antibiotics            Stopping of antibiotics       strongly encouraged.               strongly encouraged.    ----------------------------     ------------------------------       PCT level decrease by               PCT < 0.25 ng/mL       >= 80% from peak PCT       OR PCT 0.25 - 0.5 ng/mL          Stopping of  antibiotics                                             encouraged.     Stopping of antibiotics           encouraged.    ----------------------------     ------------------------------       PCT level decrease by              PCT >= 0.25 ng/mL       < 80% from peak PCT        AND PCT >= 0.5 ng/mL             Continuing antibiotics                                              encouraged.       Continuing antibiotics            encouraged.    ----------------------------     ------------------------------     PCT level increase compared          PCT > 0.5 ng/mL         with peak PCT AND          PCT >= 0.5 ng/mL             Escalation of antibiotics                                          strongly encouraged.      Escalation of antibiotics        strongly encouraged.   Brain natriuretic peptide     Status: Abnormal   Collection Time: 06/05/15  3:30 AM  Result Value Ref Range   B Natriuretic Peptide 3619.4 (H) 0.0 - 100.0 pg/mL  Protime-INR     Status: Abnormal   Collection Time: 06/05/15  3:30 AM  Result Value Ref Range   Prothrombin Time 21.2 (H) 11.6 -  15.2 seconds   INR 1.84 (H) 0.00 - 1.49  Comprehensive metabolic panel     Status: Abnormal   Collection Time: 06/05/15  3:30 AM  Result Value Ref Range   Sodium 139 135 - 145 mmol/L   Potassium 4.6 3.5 - 5.1 mmol/L   Chloride 105 101 - 111 mmol/L   CO2 23 22 - 32 mmol/L   Glucose, Bld 133 (H) 65 - 99 mg/dL   BUN 19 6 - 20 mg/dL   Creatinine, Ser 1.09 (H) 0.44 - 1.00 mg/dL   Calcium 9.1 8.9 - 10.3 mg/dL   Total Protein 6.7 6.5 - 8.1 g/dL   Albumin 2.9 (L) 3.5 - 5.0 g/dL   AST 21 15 - 41 U/L   ALT 25 14 - 54 U/L   Alkaline Phosphatase 130 (H) 38 - 126 U/L   Total Bilirubin 2.4 (H) 0.3 - 1.2 mg/dL   GFR calc non Af Amer 51 (L) >60 mL/min   GFR calc Af Amer 59 (L) >60 mL/min    Comment: (NOTE) The eGFR has been calculated using the CKD EPI equation. This calculation has not been validated in all clinical  situations. eGFR's persistently <60 mL/min signify possible Chronic Kidney Disease.    Anion gap 11 5 - 15  CBC     Status: Abnormal   Collection Time: 06/05/15  3:30 AM  Result Value Ref Range   WBC 21.4 (H) 4.0 - 10.5 K/uL   RBC 3.87 3.87 - 5.11 MIL/uL   Hemoglobin 10.5 (L) 12.0 - 15.0 g/dL   HCT 32.7 (L) 36.0 - 46.0 %   MCV 84.5 78.0 - 100.0 fL   MCH 27.1 26.0 - 34.0 pg   MCHC 32.1 30.0 - 36.0 g/dL   RDW 15.8 (H) 11.5 - 15.5 %   Platelets 209 150 - 400 K/uL  Lactic acid, plasma     Status: None   Collection Time: 06/05/15  7:40 AM  Result Value Ref Range   Lactic Acid, Venous 1.0 0.5 - 2.0 mmol/L  Lactic acid, plasma     Status: None   Collection Time: 06/05/15  9:40 AM  Result Value Ref Range   Lactic Acid, Venous 1.0 0.5 - 2.0 mmol/L      Component Value Date/Time   SDES FLUID PERICARDIAL 07/26/2014 0828   SDES FLUID PERICARDIAL 07/26/2014 0828   SDES FLUID PERICARDIAL 07/26/2014 0828   SDES FLUID PERICARDIAL 07/26/2014 0828   SPECREQUEST SPEC D PT ON ZINACEF 07/26/2014 0828   SPECREQUEST SPEC D PT ON ZINACEF 07/26/2014 0828   SPECREQUEST SPEC D PT ON ZINACEF 07/26/2014 0828   SPECREQUEST SPEC D PT ON ZINACEF 07/26/2014 0828   CULT  07/26/2014 0828    NO ANAEROBES ISOLATED Performed at [facility]  07/26/2014 0828    No Fungi Isolated in 4 Weeks Performed at [place]  07/26/2014 0828    NO GROWTH 3 DAYS Performed at [place]  07/26/2014 0828    NO ACID FAST BACILLI ISOLATED IN 6 WEEKS Performed at [facility] 07/31/2014 FINAL 07/26/2014 0828   REPTSTATUS 08/20/2014 FINAL 07/26/2014 0828   REPTSTATUS 07/29/2014 FINAL 07/26/2014 0828   REPTSTATUS 09/07/2014 FINAL 07/26/2014 0828   Ct Abdomen Pelvis W Contrast  06/05/2015  CLINICAL DATA:  Back spasms.  Leukocytosis. EXAM: CT ABDOMEN AND PELVIS WITH CONTRAST TECHNIQUE: Multidetector CT imaging of the abdomen and  pelvis was  performed using the standard protocol following bolus administration of intravenous contrast. CONTRAST:  44m OMNIPAQUE IOHEXOL 300 MG/ML  SOLN COMPARISON:  None. FINDINGS: Lower chest and abdominal wall: Chronic cardiomegaly. Remote subendocardial left ventricular infarct, greatest along the anterior wall. Chronic large pericardial effusion when compared to previous CT. Chronic small right pleural effusion with atelectasis or scar. Hepatobiliary: Liver enhancement pattern suggests passive congestion. No focal lesion.Cholelithiasis without evidence of acute cholecystitis. No duct enlargement Pancreas: Unremarkable. Spleen: Unremarkable. Adrenals/Urinary Tract: Negative adrenals. Bilateral renal cortical thinning. Small nonobstructive bilateral renal calculi. Renal sinus cysts. Chronic hemorrhagic or proteinaceous cyst in the upper pole left kidney measuring 13 mm, also seen on April 2016 chest CT. Unremarkable decompressed bladder. Reproductive:Multiple uterine fibroids measuring up to 23 mm. Stomach/Bowel: No obstruction. Colonic diverticulosis without active inflammation. No evidence of appendicitis. Vascular/Lymphatic: No acute vascular abnormality. No mass or adenopathy. Peritoneal: Small ascites Musculoskeletal: No acute abnormalities. IMPRESSION: 1. No acute finding. 2. Remote left ventricular infarct with chronic cardiomegaly. Chronic large pericardial effusion and small right pleural effusion. 3. Passive congestion of the liver. 4. Cholelithiasis and nephrolithiasis. 5. Colonic diverticulosis. Electronically Signed   By: JMonte FantasiaM.D.   On: 06/05/2015 01:33   Dg Chest Port 1 View  06/04/2015  CLINICAL DATA:  Shortness of breath EXAM: PORTABLE CHEST 1 VIEW COMPARISON:  03/22/2015 chest radiograph. FINDINGS: Right internal jugular central venous catheter terminates at the cavoatrial junction. Stable cardiomediastinal silhouette with moderate cardiomegaly. No pneumothorax. Small bilateral pleural  effusions. Mild pulmonary edema. Mild bibasilar atelectasis. There is a new subcentimeter nodular density in the peripheral left mid lung. IMPRESSION: 1. Moderate cardiomegaly and mild pulmonary edema, in keeping with mild congestive heart failure. 2. Small bilateral pleural effusions and mild bibasilar atelectasis. 3. New subcentimeter nodular density in the peripheral left mid lung, cannot exclude a pulmonary nodule. Recommend correlation with chest CT on a short term outpatient basis. Electronically Signed   By: JIlona SorrelM.D.   On: 06/04/2015 20:44   No results found for this or any previous visit (from the past 240 hour(s)).    06/05/2015, 11:31 AM     LOS: 0 days    Records and images were personally reviewed where available.

## 2015-06-05 NOTE — ED Notes (Signed)
Dr. Jerral Ralph aware of blood cultures.

## 2015-06-05 NOTE — Progress Notes (Signed)
PT Cancellation Note  Patient Details Name: Katrina Peck MRN: [id number] DOB: March 04, 1946   Cancelled Treatment:    Reason Eval/Treat Not Completed: Patient not medically ready (orders for MRI neck and back, will await clearance)   Fabio Asa 06/05/2015, 7:26 AM Charlotte Crumb, PT DPT  [phone]

## 2015-06-05 NOTE — Consult Note (Signed)
CARDIOLOGY CONSULT NOTE   Patient ID: Katrina Peck MRN: [id number], DOB/AGE: May 09, 1945   Admit date: 06/04/2015 Date of Consult: 06/05/2015  Primary Physician: Maryelizabeth Rowan, MD Primary Cardiologist: Dr Shirlee Latch  Reason for consult:  CHF  Problem List  Past Medical History  Diagnosis Date  . Anemia   . Hypertension   . Blood transfusion without reported diagnosis   . Chronic systolic CHF (congestive heart failure) (HCC)     a. 06/2014 Echo: EF 20%.  Marland Kitchen NICM (nonischemic cardiomyopathy) (HCC)     a. 06/2014 Echo: EF 20%, diff HK, mild MR, mildly dil LA/RA, mildly reduced RV fxn;  b. 06/2014 Cath: nl cors.  . Pericardial effusion     a. 06/2014 Large effusion, no tamponade phys.    Past Surgical History  Procedure Laterality Date  . No past surgeries    . Left and right heart catheterization with coronary angiogram N/A 07/23/2014    Procedure: LEFT AND RIGHT HEART CATHETERIZATION WITH CORONARY ANGIOGRAM;  Surgeon: Laurey Morale, MD;  Location: [facility] CATH LAB;  Service: Cardiovascular;  Laterality: N/A;  . Subxyphoid pericardial window N/A 07/26/2014    Procedure: SUBXYPHOID PERICARDIAL WINDOW;  Surgeon: Delight Ovens, MD;  Location: [facility] OR;  Service: Thoracic;  Laterality: N/A;  . Tee without cardioversion N/A 07/26/2014    Procedure: TRANSESOPHAGEAL ECHOCARDIOGRAM (TEE);  Surgeon: Delight Ovens, MD;  Location: [facility] OR;  Service: Thoracic;  Laterality: N/A;  . Pleural effusion drainage N/A 07/26/2014    Procedure: DRAINAGE OF PERICARDIAL EFFUSION;  Surgeon: Delight Ovens, MD;  Location: [facility] OR;  Service: Thoracic;  Laterality: N/A;  . Cardiac catheterization N/A 02/07/2015    Procedure: Right Heart Cath;  Surgeon: Laurey Morale, MD;  Location: [facility] INVASIVE CV LAB;  Service: Cardiovascular;  Laterality: N/A;    Allergies  No Known Allergies  HPI   70 y/o female with history of HTN who was admitted 07/20/14 with 3 months of exertional dyspnea, weight gain, cough, and  orthopnea. Found to have EF 20% and a large pericardial effusion. Cath 5/16 no CAD. Hospital course complicated by atrial fibrillation w/ RVR, converted to NSR on amiodarone. Underwent pericardial window (transudate).  She had a repeat echo in 6/16 that showed persistently low LV EF at 15-20% with recurrence of moderate to large pericardial effusion without tamponade. This showed EF 10-15%, mild LV dilation, restrictive diastolic function, moderate to severely decreased RV function, and a moderate to large pericardial effusion similar to 6/16.  Admitted in November with marked volume overload. Diuresed with IV lasix. Discharged on milrinone 0.125 mcg. BB and ACE stopped. Discharge weight was 92 pounds.  She had problems with her PICC line (numbness/pain in right arm/hand) and wanted the PICC line out. She did not want placement of tunneled catheter. Therefore, PICC was removed and milrinone stopped. She re-developed symptomatic low output CHF and was re-admitted in 12/16. She then had tunneled catheter placed and was restarted on milrinone and was diuresed. She was discharged home back on milrinone. Echo in 12/16 showed EF 20% with LV thrombus. She was put on coumadin.  She was last seen by Dr Shirlee Latch on 05/04/14, she was 5 lbs up from her baseline and torsemide was increased from 2x/week to daily.   She presented today with back pain and back spasm. Patient reports that she started having severe lower back pain and back spasm 3 days ago, which is initially located in the lower back in the midline and also slightly  to the left side of the paraspinal area. It is constant, 10 out of 10 in severity. It has progressed to have involved thoracic spine and the neck. She has severe neck pain over the back of her neck and also left side of neck. Patient reports that she has right arm numbness and weakness, which started when she had PICC line placement in her right arm, which was removed because of her right arm  pain and numbness. Patient does not have chest pain, cough, abdominal pain, diarrhea, symptoms of UTI. She has chronic shortness of breath, which has not changed significantly. In ED, patient was found to have lactase 1.93, leukocytosis 25.000, troponin negative, BNP 3619, negative urinalysis, chills,  temperature normal, tachycardia, tachypnea, creatinine 1.07. CT abdomen/pelvis is negative for acute abnormalities. Patient is admitted to inpatient for further evaluation treatment.  She was started on ceftriaxone for possible line associated infection. She states that her SOB was only mildly worsened at home, has 2 pillow orthopnea, no PND, no LE edema.     Inpatient Medications  . amiodarone  100 mg Oral Daily  . digoxin  0.0625 mg Oral Daily  . feeding supplement (ENSURE ENLIVE)  237 mL Oral BID BM  . furosemide  40 mg Intravenous BID  . magnesium oxide  200 mg Oral BID  . polyethylene glycol  17 g Oral Daily  . potassium chloride  20 mEq Oral Daily  . sodium chloride flush  3 mL Intravenous Q12H  . [START ON 06/06/2015] vancomycin  750 mg Intravenous Q24H  . warfarin  7.5 mg Oral ONCE-1800  . Warfarin - Pharmacist Dosing Inpatient   Does not apply q1800   Family History Family History  Problem Relation Age of Onset  . Hypertension Sister   . Diabetes Maternal Grandmother   . Cancer Mother     died young (pt was only in [employer] @ the time)  . Other Father     died in his 14's.     Social History Social History   Social History  . Marital Status: Married    Spouse Name: N/A  . Number of Children: N/A  . Years of Education: N/A   Occupational History  . Not on file.   Social History Main Topics  . Smoking status: Never Smoker   . Smokeless tobacco: Never Used  . Alcohol Use: No  . Drug Use: No  . Sexual Activity: No   Other Topics Concern  . Not on file   Social History Narrative   Lives in [place] with husband.  Not currently working.     Review of  Systems  General:  No chills, fever, night sweats or weight changes.  Cardiovascular:  No chest pain, dyspnea on exertion, edema, orthopnea, palpitations, paroxysmal nocturnal dyspnea. Dermatological: No rash, lesions/masses Respiratory: No cough, dyspnea Urologic: No hematuria, dysuria Abdominal:   No nausea, vomiting, diarrhea, bright red blood per rectum, melena, or hematemesis Neurologic:  No visual changes, wkns, changes in mental status. All other systems reviewed and are otherwise negative except as noted above.  Physical Exam  Blood pressure 101/71, pulse 90, temperature 98.5 F (36.9 C), temperature source Oral, resp. rate 19, weight 103 lb (46.72 kg), SpO2 93 %.  General: Pleasant, in acute distress sec to back and neck pain, in sitting position Psych: Normal affect. Neuro: Alert and oriented X 3. Moves all extremities spontaneously. HEENT: Normal  Neck: Supple without bruits, no JVDs in upright position Lungs:  Resp regular and  unlabored, Crackles at both basis  Heart: RRR no s3, s4, or murmurs. Abdomen: Soft, non-tender, non-distended, BS + x 4.  Extremities: No clubbing, cyanosis or edema. DP/PT/Radials 2+ and equal bilaterally.  Labs  No results for input(s): CKTOTAL, CKMB, TROPONINI in the last 72 hours. Lab Results  Component Value Date   WBC 21.4* 06/05/2015   HGB 10.5* 06/05/2015   HCT 32.7* 06/05/2015   MCV 84.5 06/05/2015   PLT 209 06/05/2015    Recent Labs Lab 06/05/15 0330  NA 139  K 4.6  CL 105  CO2 23  BUN 19  CREATININE 1.09*  CALCIUM 9.1  PROT 6.7  BILITOT 2.4*  ALKPHOS 130*  ALT 25  AST 21  GLUCOSE 133*   Radiology/Studies  Ct Abdomen Pelvis W Contrast  06/05/2015  CLINICAL DATA:  Back spasms.  Leukocytosis.  IMPRESSION: 1. No acute finding. 2. Remote left ventricular infarct with chronic cardiomegaly. Chronic large pericardial effusion and small right pleural effusion. 3. Passive congestion of the liver. 4. Cholelithiasis and  nephrolithiasis. 5. Colonic diverticulosis. Electronically Signed   By: Marnee Spring M.D.   On: 06/05/2015 01:33   Dg Chest Port 1 View  06/04/2015  CLINICAL DATA:  Shortness of breath EXAM: PORTABLE CHEST 1 VIEW COMPARISON:   IMPRESSION: 1. Moderate cardiomegaly and mild pulmonary edema, in keeping with mild congestive heart failure. 2. Small bilateral pleural effusions and mild bibasilar atelectasis. 3. New subcentimeter nodular density in the peripheral left mid lung, cannot exclude a pulmonary nodule. Recommend correlation with chest CT on a short term outpatient basis. Electronically Signed   By: Delbert Phenix M.D.   On: 06/04/2015 20:44   Echocardiogram - 03/23/2015 - Left ventricle: Large chronic appearing mural apical thrombus.  The cavity size was severely dilated. Wall thickness was normal.  The estimated ejection fraction was 20%. - Mitral valve: There was mild regurgitation. - Left atrium: The atrium was mildly dilated. - Atrial septum: No defect or patent foramen ovale was identified. - Tricuspid valve: There was moderate regurgitation. - Pulmonary arteries: PA peak pressure: 66 mm Hg (S). - Pericardium, extracardiac: Moderate posterior and lateral  pericardial effusion with no evidnece of tamponade.  ECG: ST, LBBB, unchanged from prior   ASSESSMENT AND PLAN  1. Acute on Chronic systolic HF: Due to nonischemic cardiomyopathy. CMRI (8/16) showed LV EF 11%, RV EF 30%, and patchy mid-wall LGE in the basal-mid septum that may suggest prior myocarditis. She is now back on milrinone 0.25 via tunneled catheter with no problems. NYHA class III symptoms. Mild volume overload on exam and weight is up. Echo (12/16) with EF 20% and LV thrombus.  - She needs to take torsemide 20 mg daily rather than once a week. We discussed how the torsemide is not making her short of breath, her CHF is. I showed her her echocardiogram to help her understand. BMET in 1 week.  - Continue  milrinone 0.25. She did not tolerate stopping milrinone.  - Continue current digoxin, check level. - Continue spironolactone.  - No beta blocker with low output.  - She has advanced heart failure with high risk of morbidity/mortality over the next 6 months but she has great difficulty following cardiology recommendations. I have discussed with her the limitations of milrinone therapy. I will give her a handout on LVAD to let herself get familiarized with it. Given compliance issues, I think that she is going to be a poor LVAD candidate. - She has LBBB-like IVCD, QRS 138 msec.  I do not think that she would derive enough benefit from CRT to get off milrinone. I do not think she would agree to device placement. - agree with lasix 40 mg iv BID  2. Leukocytosis - possible line infection - we will obtain TEE tomorrow to evaluate for endocarditis - on ceftriaxone, followed by ID, BC are pending  3. PAF: NSR. Continue amio to 100 mg daily. Check TSH, LFTs. She was told to get regular eye exams while on amiodarone.  - Continue warfarin.   4. Pericardial effusion: s/p pericardial window. Transudate, cytology negative. Last ECHO 02/2015 EF 10-15% and stable moderate pericardial effusion => no tamponade. We will reevaluate on the TEE tomorrow.   5. LV thrombus: She is on warfarin. INR 1.8, start iv heparin  Signed, Lars Masson, MD, [facility] 06/05/2015, 1:39 PM

## 2015-06-05 NOTE — ED Notes (Signed)
Dr Niu at bedside 

## 2015-06-05 NOTE — ED Notes (Signed)
Admitting MD  Paged to make aware of blood cultures.

## 2015-06-05 NOTE — Progress Notes (Signed)
ANTICOAGULATION CONSULT NOTE - Follow Up Consult  Pharmacy Consult for heparin Indication: Afib and h/o LV thrombus  Labs:  Recent Labs  06/04/15 2048 06/05/15 0330 06/05/15 2230  HGB 11.4* 10.5*  --   HCT 35.5* 32.7*  --   PLT 235 209  --   LABPROT 20.1* 21.2*  --   INR 1.72* 1.84*  --   HEPARINUNFRC  --   --  <0.10*  CREATININE 1.07* 1.09*  --     Assessment: 69yo female undetectable on heparin with initial dosing for low INR.  Goal of Therapy:  Heparin level 0.3-0.7 units/ml   Plan:  Will increase heparin gtt by 4 units/kg/hr to 1000 units/hr and check level in 8hr.  Vernard Gambles, PharmD, BCPS  06/05/2015,11:48 PM

## 2015-06-05 NOTE — ED Notes (Signed)
Resting on stretcher patient will be sleeping and yell out in pain . States her neck just spasms . Patient is alert oriented.

## 2015-06-05 NOTE — ED Notes (Signed)
Paged Dr. Jerral Ralph to Tresa Endo, RN.

## 2015-06-05 NOTE — Progress Notes (Addendum)
PATIENT DETAILS Name: Katrina Peck Age: 70 y.o. Sex: female Date of Birth: 10-12-1945 Admit Date: 06/04/2015 Admitting Physician Lorretta Harp, MD AVW:UJWJX,[id number], MD   Brief narrative: 69 year old female with history of chronic systolic heart failure-on milrinone infusion at home via a tunneled catheter, presented to the hospital with severe lower back pain. Found to have significant leukocytosis. No other site of infection apparent, concern for catheter-related bloodstream infection and discitis.Admitted for further evaluation and treatment  Subjective: Continues to have back pain. Denies headache, nausea, vomiting, diarrhea, URI like symptoms.  Assessment/Plan: Principal Problem: Sepsis: Suspicion for catheter related blood stream infection and possible discitis. Await blood cultures. UA/chest x-ray negative for infection. CT Abd neg for infection as well.Continue empiric antibiotics, infectious disease consulted. See below for further details.  Addendum: Blood culture positive-will consult IR to see if we can remove tunneled catheter  Active Problems: Worsening back pain: Given significant leukocytosis, concern for discitis. Unfortunately not able to lie flat for a MRI (has CHF). Await culture data, await ID evaluation-spoke with her primary cardiologist Dr. Jerral Bonito chronic orthopnea-may not improve with diuresis-if MRI really needed, may need to proceed under general anesthesia.  Acute on chronic systolic heart failure (EF 20% by TTE on December 2016): Hx of Nonischemic cardiomyopathy.LHC(4/16) with no significant coronary disease. Appears to have mild volume overload on exam-Current weight 103 pounds, well above her most recent discharge weight of 95 pounds. Start IV Lasix, continue milrinone and digoxin, CHF team will follow.  History of LV thrombus: On chronic Coumadin. Pharmacy dosing.  Paroxysmal atrial fibrillation: Rate controlled with amiodarone, on  chronic Coumadin.CHADS2VASC 5.  Chronic pericardial effusion: s/p window 07/26/14-cytology was negative. Recheck echo-CT abdomen done on admission shows a large pericardial effusion.  Pul OZH:YQMVHQ secondary to LV failure  Nodular density in left mid lung on chest x-ray: CT chest when more stable.  Protein-calorie malnutrition, severe:nutriton evaluation  Disposition: Remain inpatient  Antimicrobial agents  See below  Anti-infectives    Start     Dose/Rate Route Frequency Ordered Stop   06/06/15 0400  vancomycin (VANCOCIN) IVPB 750 mg/150 ml premix     750 mg 150 mL/hr over 60 Minutes Intravenous Every 24 hours 06/05/15 0302     06/05/15 1400  piperacillin-tazobactam (ZOSYN) IVPB 3.375 g     3.375 g 12.5 mL/hr over 240 Minutes Intravenous 3 times per day 06/05/15 0302     06/05/15 0315  piperacillin-tazobactam (ZOSYN) IVPB 3.375 g     3.375 g 100 mL/hr over 30 Minutes Intravenous STAT 06/05/15 0302 06/05/15 0610   06/05/15 0315  vancomycin (VANCOCIN) IVPB 1000 mg/200 mL premix     1,000 mg 200 mL/hr over 60 Minutes Intravenous NOW 06/05/15 0302 06/05/15 0533      DVT Prophylaxis: Coumadin  Code Status: Full code   Family Communication None at bedside  Procedures: None  CONSULTS:  cardiology and ID  Time spent 40 minutes-Greater than 50% of this time was spent in counseling, explanation of diagnosis, planning of further management, and coordination of care.  MEDICATIONS: Scheduled Meds: . amiodarone  100 mg Oral Daily  . digoxin  0.0625 mg Oral Daily  . feeding supplement (ENSURE ENLIVE)  237 mL Oral BID BM  . furosemide  40 mg Intravenous BID  . magnesium oxide  200 mg Oral BID  . sodium chloride flush  3 mL Intravenous Q12H  . [START ON 06/06/2015] vancomycin  750  mg Intravenous Q24H  . warfarin  7.5 mg Oral ONCE-1800  . Warfarin - Pharmacist Dosing Inpatient   Does not apply q1800   Continuous Infusions: . milrinone 0.25 mcg/kg/min (06/05/15 0549)  .  piperacillin-tazobactam (ZOSYN)  IV     PRN Meds:.acetaminophen **OR** acetaminophen, HYDROmorphone (DILAUDID) injection, morphine injection, oxyCODONE-acetaminophen, sodium chloride flush    PHYSICAL EXAM: Vital signs in last 24 hours: Filed Vitals:   06/05/15 0645 06/05/15 0700 06/05/15 0715 06/05/15 0931  BP: 112/89  Pulse: 97 96 91 96  Temp:      TempSrc:      Resp: Weight:      SpO2: 96% 97% 96% 98%    Weight change:  Filed Weights   06/04/15 1756  Weight: 46.72 kg (103 lb)   Body mass index is 18.83 kg/(m^2).   Gen Exam: Awake and alert with clear speech.   Neck: Supple, + JVD.   Chest: few bibasilar rales-but otherwise clear CVS: S1 S2 Regular Abdomen: soft, BS +, non tender, non distended.  Extremities: trace edema, lower extremities warm to touch,+chronic venous stasis changes Neurologic:gen weakness-but not focal Skin: No Rash.   Wounds: N/A.    Intake/Output from previous day:  Intake/Output Summary (Last 24 hours) at 06/05/15 1043 Last data filed at 06/05/15 1023  Gross per 24 hour  Intake      0 ml  Output    500 ml  Net   -500 ml     LAB RESULTS: CBC  Recent Labs Lab 06/04/15 2048 06/05/15 0330  WBC 24.9* 21.4*  HGB 11.4* 10.5*  HCT 35.5* 32.7*  PLT 235 209  MCV 84.1 84.5  MCH 27.0 27.1  MCHC 32.1 32.1  RDW 15.7* 15.8*    Chemistries   Recent Labs Lab 06/04/15 2048 06/05/15 0330  NA 142 139  K 4.6 4.6  CL 103 105  CO2 20* 23  GLUCOSE 129* 133*  BUN 19 19  CREATININE 1.07* 1.09*  CALCIUM 9.8 9.1    CBG: No results for input(s): GLUCAP in the last 168 hours.  GFR Estimated Creatinine Clearance: 36.4 mL/min (by C-G formula based on Cr of 1.09).  Coagulation profile  Recent Labs Lab 05/30/15 06/04/15 2048 06/05/15 0330  INR 1.6 1.72* 1.84*    Cardiac Enzymes No results for input(s): CKMB, TROPONINI, MYOGLOBIN in the last 168 hours.  Invalid input(s): CK  Invalid input(s):  POCBNP No results for input(s): DDIMER in the last 72 hours. No results for input(s): HGBA1C in the last 72 hours. No results for input(s): CHOL, HDL, LDLCALC, TRIG, CHOLHDL, LDLDIRECT in the last 72 hours. No results for input(s): TSH, T4TOTAL, T3FREE, THYROIDAB in the last 72 hours.  Invalid input(s): FREET3 No results for input(s): VITAMINB12, FOLATE, FERRITIN, TIBC, IRON, RETICCTPCT in the last 72 hours. No results for input(s): LIPASE, AMYLASE in the last 72 hours.  Urine Studies No results for input(s): UHGB, CRYS in the last 72 hours.  Invalid input(s): UACOL, UAPR, USPG, UPH, UTP, UGL, UKET, UBIL, UNIT, UROB, ULEU, UEPI, UWBC, URBC, UBAC, CAST, UCOM, BILUA  MICROBIOLOGY: No results found for this or any previous visit (from the past 240 hour(s)).  RADIOLOGY STUDIES/RESULTS: Ct Abdomen Pelvis W Contrast  06/05/2015  CLINICAL DATA:  Back spasms.  Leukocytosis. EXAM: CT ABDOMEN AND PELVIS WITH CONTRAST TECHNIQUE: Multidetector CT imaging of the abdomen and pelvis was performed using the standard protocol following bolus administration of intravenous contrast. CONTRAST:  75mL OMNIPAQUE  IOHEXOL 300 MG/ML  SOLN COMPARISON:  None. FINDINGS: Lower chest and abdominal wall: Chronic cardiomegaly. Remote subendocardial left ventricular infarct, greatest along the anterior wall. Chronic large pericardial effusion when compared to previous CT. Chronic small right pleural effusion with atelectasis or scar. Hepatobiliary: Liver enhancement pattern suggests passive congestion. No focal lesion.Cholelithiasis without evidence of acute cholecystitis. No duct enlargement Pancreas: Unremarkable. Spleen: Unremarkable. Adrenals/Urinary Tract: Negative adrenals. Bilateral renal cortical thinning. Small nonobstructive bilateral renal calculi. Renal sinus cysts. Chronic hemorrhagic or proteinaceous cyst in the upper pole left kidney measuring 13 mm, also seen on April 2016 chest CT. Unremarkable decompressed  bladder. Reproductive:Multiple uterine fibroids measuring up to 23 mm. Stomach/Bowel: No obstruction. Colonic diverticulosis without active inflammation. No evidence of appendicitis. Vascular/Lymphatic: No acute vascular abnormality. No mass or adenopathy. Peritoneal: Small ascites Musculoskeletal: No acute abnormalities. IMPRESSION: 1. No acute finding. 2. Remote left ventricular infarct with chronic cardiomegaly. Chronic large pericardial effusion and small right pleural effusion. 3. Passive congestion of the liver. 4. Cholelithiasis and nephrolithiasis. 5. Colonic diverticulosis. Electronically Signed   By: Marnee Spring M.D.   On: 06/05/2015 01:33   Dg Chest Port 1 View  06/04/2015  CLINICAL DATA:  Shortness of breath EXAM: PORTABLE CHEST 1 VIEW COMPARISON:  03/22/2015 chest radiograph. FINDINGS: Right internal jugular central venous catheter terminates at the cavoatrial junction. Stable cardiomediastinal silhouette with moderate cardiomegaly. No pneumothorax. Small bilateral pleural effusions. Mild pulmonary edema. Mild bibasilar atelectasis. There is a new subcentimeter nodular density in the peripheral left mid lung. IMPRESSION: 1. Moderate cardiomegaly and mild pulmonary edema, in keeping with mild congestive heart failure. 2. Small bilateral pleural effusions and mild bibasilar atelectasis. 3. New subcentimeter nodular density in the peripheral left mid lung, cannot exclude a pulmonary nodule. Recommend correlation with chest CT on a short term outpatient basis. Electronically Signed   By: Delbert Phenix M.D.   On: 06/04/2015 20:44    Jeoffrey Massed, MD  [facility] Pager:[phone]  If 7PM-7AM, please contact night-coverage [email or web address] Password [facility] 06/05/2015, 10:43 AM   LOS: 0 days

## 2015-06-06 ENCOUNTER — Inpatient Hospital Stay (HOSPITAL_COMMUNITY): Payer: Medicare Other

## 2015-06-06 ENCOUNTER — Inpatient Hospital Stay (HOSPITAL_COMMUNITY): Payer: Medicare Other | Admitting: Certified Registered"

## 2015-06-06 ENCOUNTER — Encounter (HOSPITAL_COMMUNITY)
Admission: EM | Disposition: A | Discharge status: Skilled Nursing Facility | Payer: Self-pay | Source: Home / Self Care | Attending: Internal Medicine

## 2015-06-06 ENCOUNTER — Encounter (HOSPITAL_COMMUNITY): Payer: Self-pay

## 2015-06-06 DIAGNOSIS — M5440 Lumbago with sciatica, unspecified side: Secondary | ICD-10-CM

## 2015-06-06 DIAGNOSIS — R7881 Bacteremia: Secondary | ICD-10-CM

## 2015-06-06 DIAGNOSIS — M545 Low back pain: Secondary | ICD-10-CM

## 2015-06-06 HISTORY — PX: TEE WITHOUT CARDIOVERSION: SHX5443

## 2015-06-06 LAB — CBC
HCT: 32 % — ABNORMAL LOW (ref 36.0–46.0)
HEMOGLOBIN: 10.1 g/dL — AB (ref 12.0–15.0)
MCH: 26.6 pg (ref 26.0–34.0)
MCHC: 31.6 g/dL (ref 30.0–36.0)
MCV: 84.4 fL (ref 78.0–100.0)
PLATELETS: 225 10*3/uL (ref 150–400)
RBC: 3.79 MIL/uL — ABNORMAL LOW (ref 3.87–5.11)
RDW: 15.6 % — AB (ref 11.5–15.5)
WBC: 19.9 10*3/uL — ABNORMAL HIGH (ref 4.0–10.5)

## 2015-06-06 LAB — HEPARIN LEVEL (UNFRACTIONATED): Heparin Unfractionated: 0.27 IU/mL — ABNORMAL LOW (ref 0.30–0.70)

## 2015-06-06 LAB — BASIC METABOLIC PANEL
ANION GAP: 8 (ref 5–15)
BUN: 19 mg/dL (ref 6–20)
CALCIUM: 8.8 mg/dL — AB (ref 8.9–10.3)
CO2: 25 mmol/L (ref 22–32)
CREATININE: 0.92 mg/dL (ref 0.44–1.00)
Chloride: 101 mmol/L (ref 101–111)
GFR calc Af Amer: >60 mL/min (ref >60)
GLUCOSE: 116 mg/dL — AB (ref 65–99)
Potassium: 3.8 mmol/L (ref 3.5–5.1)
Sodium: 134 mmol/L — ABNORMAL LOW (ref 135–145)

## 2015-06-06 LAB — DIGOXIN LEVEL

## 2015-06-06 LAB — GLUCOSE, CAPILLARY: Glucose-Capillary: 118 mg/dL — ABNORMAL HIGH (ref 65–99)

## 2015-06-06 LAB — PROTIME-INR
INR: 1.81 — AB (ref 0.00–1.49)
PROTHROMBIN TIME: 21 s — AB (ref 11.6–15.2)

## 2015-06-06 SURGERY — ECHOCARDIOGRAM, TRANSESOPHAGEAL
Anesthesia: Monitor Anesthesia Care

## 2015-06-06 MED ORDER — KETAMINE HCL 100 MG/ML IJ SOLN
INTRAMUSCULAR | Status: DC | PRN
  Administered 2015-06-06 (×3): 10 mg via INTRAVENOUS

## 2015-06-06 MED ORDER — WARFARIN SODIUM 7.5 MG PO TABS
7.5000 mg | ORAL_TABLET | Freq: Once | ORAL | Status: AC
  Administered 2015-06-06: 7.5 mg via ORAL
  Filled 2015-06-06: qty 1

## 2015-06-06 MED ORDER — LIDOCAINE HCL 1 % IJ SOLN
INTRAMUSCULAR | Status: AC
  Filled 2015-06-06: qty 20

## 2015-06-06 MED ORDER — PHENYLEPHRINE HCL 10 MG/ML IJ SOLN
INTRAMUSCULAR | Status: DC | PRN
  Administered 2015-06-06: 80 ug via INTRAVENOUS

## 2015-06-06 MED ORDER — SODIUM CHLORIDE 0.9 % IV SOLN: INTRAVENOUS | Status: DC

## 2015-06-06 MED ORDER — METHOCARBAMOL 750 MG PO TABS
750.0000 mg | ORAL_TABLET | Freq: Four times a day (QID) | ORAL | Status: DC | PRN
  Administered 2015-06-06 – 2015-06-13 (×8): 750 mg via ORAL
  Filled 2015-06-06: qty 1
  Filled 2015-06-06 (×4): qty 2
  Filled 2015-06-06: qty 1
  Filled 2015-06-06 (×3): qty 2

## 2015-06-06 MED ORDER — BUTAMBEN-TETRACAINE-BENZOCAINE 2-2-14 % EX AERO
INHALATION_SPRAY | CUTANEOUS | Status: DC | PRN
  Administered 2015-06-06: 2 via TOPICAL

## 2015-06-06 MED ORDER — CHLORHEXIDINE GLUCONATE 4 % EX LIQD
CUTANEOUS | Status: AC
  Filled 2015-06-06: qty 15

## 2015-06-06 MED ORDER — KETAMINE HCL 100 MG/ML IJ SOLN
INTRAMUSCULAR | Status: AC
  Filled 2015-06-06: qty 1

## 2015-06-06 MED ORDER — MIDAZOLAM HCL 2 MG/2ML IJ SOLN
INTRAMUSCULAR | Status: DC | PRN
  Administered 2015-06-06 (×2): 1 mg via INTRAVENOUS

## 2015-06-06 NOTE — Progress Notes (Signed)
Initial Nutrition Assessment  DOCUMENTATION CODES:   Non-severe (moderate) malnutrition in context of chronic illness, Underweight  INTERVENTION:   -Once diet is advanced, continue Ensure Enlive po BID, each supplement provides 350 kcal and 20 grams of protein  NUTRITION DIAGNOSIS:   Malnutrition related to chronic illness as evidenced by mild depletion of body fat, mild depletion of muscle mass.  GOAL:   Patient will meet greater than or equal to 90% of their needs  MONITOR:   PO intake, Supplement acceptance, Labs, Weight trends, Skin, I & O's  REASON FOR ASSESSMENT:   Consult Assessment of nutrition requirement/status  ASSESSMENT:   Katrina Peck is a 70 y.o. female with PMH of hypertension, systolic congestive heart failure with a year for 20% on milrinone drip,, anemia, PAF on Coumadin, anemia, who presents with back pain and back spasm.  Pt admitted with sepsis suspicious for catheter related blood infection.   Pt was drowsy at time of visit and unable to participate in interview. Spoke with RN, who reports that pt had just returned to room from TEE.   Wt hx reviewed, which revealed wt stability. UBW around 100#. Pt is small-framed and suspect she is thin at baseline.   Pt is currently NPO. She was previously on a Heart Healthy diet, with poor intake (PO: 30%). Ensure supplements have already been ordered; RD will continue supplements to optimize nutritional intake.   Nutrition-Focused physical exam completed. Findings are mild to moderate fat depletion, mild to moderate muscle depletion, and mild edema.   Labs reviewed: Na: 134, CBGS: 118.  Diet Order:   NPO  Skin:  Reviewed, no issues  Last BM:  06/04/15  Height:   Ht Readings from Last 1 Encounters:  06/05/15 5\' 2"  (1.575 m)    Weight:   Wt Readings from Last 1 Encounters:  06/05/15 99 lb 3.3 oz (45 kg)    Ideal Body Weight:  50 kg  BMI:  Body mass index is 18.14 kg/(m^2).  Estimated  Nutritional Needs:   Kcal:  1350-1550  Protein:  60-75 grams  Fluid:  1.3-1.5 L  EDUCATION NEEDS:   No education needs identified at this time  Giulietta Prokop A. Mayford Knife, RD, LDN, CDE Pager: [phone] After hours Pager: [phone]

## 2015-06-06 NOTE — H&P (View-Only) (Signed)
Patient ID: Katrina Peck, female   DOB: [birth year], 70 y.o.   MRN: [id number]     SUBJECTIVE: Still with back and neck pain.  Sitting up in bed.  States "always short of breath, no change."  Got IV Lasix yesterday, I/Os negative.  No fever.   GPCs in blood.   Scheduled Meds: . amiodarone  100 mg Oral Daily  . cefTRIAXone (ROCEPHIN)  IV  2 g Intravenous Q24H  . digoxin  0.0625 mg Oral Daily  . feeding supplement (ENSURE ENLIVE)  237 mL Oral BID BM  . furosemide  40 mg Intravenous BID  . magnesium oxide  200 mg Oral BID  . polyethylene glycol  17 g Oral Daily  . potassium chloride  20 mEq Oral Daily  . sodium chloride flush  3 mL Intravenous Q12H  . vancomycin  750 mg Intravenous Q24H  . Warfarin - Pharmacist Dosing Inpatient   Does not apply q1800   Continuous Infusions: . heparin 1,000 Units/hr (06/05/15 2352)  . milrinone 0.25 mcg/kg/min (06/06/15 0352)   PRN Meds:.acetaminophen **OR** acetaminophen, HYDROmorphone (DILAUDID) injection, methocarbamol, morphine injection, oxyCODONE-acetaminophen, sodium chloride flush    Filed Vitals:   06/06/15 0315 06/06/15 0400 06/06/15 0500 06/06/15 0600  BP:  116/88 119/88 115/85  Pulse:  97 93 92  Temp: 98.2 F (36.8 C)     TempSrc: Oral     Resp:      Height:      Weight:      SpO2:  98% 99% 100%    Intake/Output Summary (Last 24 hours) at 06/06/15 0729 Last data filed at 06/06/15 0600  Gross per 24 hour  Intake 733.41 ml  Output   1500 ml  Net -766.59 ml    LABS: Basic Metabolic Panel:  Recent Labs  67/54/49 0330 06/06/15 0424  NA 139 134*  K 4.6 3.8  CL 105 101  CO2 23 25  GLUCOSE 133* 116*  BUN 19 19  CREATININE 1.09* 0.92  CALCIUM 9.1 8.8*   Liver Function Tests:  Recent Labs  06/04/15 2048 06/05/15 0330  AST 21 21  ALT 28 25  ALKPHOS 146* 130*  BILITOT 2.7* 2.4*  PROT 7.9 6.7  ALBUMIN 3.2* 2.9*   No results for input(s): LIPASE, AMYLASE in the last 72 hours. CBC:  Recent Labs   06/05/15 0330 06/06/15 0424  WBC 21.4* 19.9*  HGB 10.5* 10.1*  HCT 32.7* 32.0*  MCV 84.5 84.4  PLT 209 225   Cardiac Enzymes: No results for input(s): CKTOTAL, CKMB, CKMBINDEX, TROPONINI in the last 72 hours. BNP: Invalid input(s): POCBNP D-Dimer: No results for input(s): DDIMER in the last 72 hours. Hemoglobin A1C: No results for input(s): HGBA1C in the last 72 hours. Fasting Lipid Panel: No results for input(s): CHOL, HDL, LDLCALC, TRIG, CHOLHDL, LDLDIRECT in the last 72 hours. Thyroid Function Tests: No results for input(s): TSH, T4TOTAL, T3FREE, THYROIDAB in the last 72 hours.  Invalid input(s): FREET3 Anemia Panel: No results for input(s): VITAMINB12, FOLATE, FERRITIN, TIBC, IRON, RETICCTPCT in the last 72 hours.  RADIOLOGY: Ct Abdomen Pelvis W Contrast  06/05/2015  CLINICAL DATA:  Back spasms.  Leukocytosis. EXAM: CT ABDOMEN AND PELVIS WITH CONTRAST TECHNIQUE: Multidetector CT imaging of the abdomen and pelvis was performed using the standard protocol following bolus administration of intravenous contrast. CONTRAST:  22mL OMNIPAQUE IOHEXOL 300 MG/ML  SOLN COMPARISON:  None. FINDINGS: Lower chest and abdominal wall: Chronic cardiomegaly. Remote subendocardial left ventricular infarct, greatest along the anterior wall. Chronic  large pericardial effusion when compared to previous CT. Chronic small right pleural effusion with atelectasis or scar. Hepatobiliary: Liver enhancement pattern suggests passive congestion. No focal lesion.Cholelithiasis without evidence of acute cholecystitis. No duct enlargement Pancreas: Unremarkable. Spleen: Unremarkable. Adrenals/Urinary Tract: Negative adrenals. Bilateral renal cortical thinning. Small nonobstructive bilateral renal calculi. Renal sinus cysts. Chronic hemorrhagic or proteinaceous cyst in the upper pole left kidney measuring 13 mm, also seen on April 2016 chest CT. Unremarkable decompressed bladder. Reproductive:Multiple uterine fibroids  measuring up to 23 mm. Stomach/Bowel: No obstruction. Colonic diverticulosis without active inflammation. No evidence of appendicitis. Vascular/Lymphatic: No acute vascular abnormality. No mass or adenopathy. Peritoneal: Small ascites Musculoskeletal: No acute abnormalities. IMPRESSION: 1. No acute finding. 2. Remote left ventricular infarct with chronic cardiomegaly. Chronic large pericardial effusion and small right pleural effusion. 3. Passive congestion of the liver. 4. Cholelithiasis and nephrolithiasis. 5. Colonic diverticulosis. Electronically Signed   By: Jonathon  Watts M.D.   On: 06/05/2015 01:33   Dg Chest Port 1 View  06/04/2015  CLINICAL DATA:  Shortness of breath EXAM: PORTABLE CHEST 1 VIEW COMPARISON:  03/22/2015 chest radiograph. FINDINGS: Right internal jugular central venous catheter terminates at the cavoatrial junction. Stable cardiomediastinal silhouette with moderate cardiomegaly. No pneumothorax. Small bilateral pleural effusions. Mild pulmonary edema. Mild bibasilar atelectasis. There is a new subcentimeter nodular density in the peripheral left mid lung. IMPRESSION: 1. Moderate cardiomegaly and mild pulmonary edema, in keeping with mild congestive heart failure. 2. Small bilateral pleural effusions and mild bibasilar atelectasis. 3. New subcentimeter nodular density in the peripheral left mid lung, cannot exclude a pulmonary nodule. Recommend correlation with chest CT on a short term outpatient basis. Electronically Signed   By: Jason A Poff M.D.   On: 06/04/2015 20:44    PHYSICAL EXAM General: Uncomfortable.  Neck: JVP difficult but likely around 10 cm, no thyromegaly or thyroid nodule.  Lungs: Decreased breath sounds at bases bilaterally.  CV: Lateral PMI.  Heart regular S1/S2, no S3/S4, no murmur.  No peripheral edema.   Abdomen: Soft, nontender, no hepatosplenomegaly, no distention.  Neurologic: Alert and oriented x 3.  Psych: Normal affect. Extremities: No clubbing or  cyanosis.   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  WBCs high, concern for tunneled catheter infection with possible spread cause discitis/back pain.  GPCs in blood.  1. ID: GPC bacteremia.  ID following.  Afebrile this morning, on vancomycin and ceftriaxone.  Concern that back pain could represent discitis.  - Once she has alternative IV access, she will need tunneled catheter removed (likely source).  IV team to try to place peripheral IVs, if unable will need CVL.  - Continue abx.  - Should get TEE to assess for endocarditis with GPCs.  - Will need MRI of back to look for discitis, will have to be sedated.  2. Acute on chronic systolic CHF: Last echo with EF 20%, moderate pericardial effusion.  She is milrinone dependent. Some volume overload on exam with dyspnea.  - Will need to continue milrinone => via PIV or CVL after tunneled catheter is out.  - Continue Lasix 40 mg IV bid.  - Continue digoxin.  3. LV mural thrombus: INR subtherapeutic, now on heparin/coumadin.  INR today.   Destane Speas 06/06/2015 7:36 AM  

## 2015-06-06 NOTE — Care Management Note (Signed)
Case Management Note  Patient Details  Name: Katrina Peck MRN: [id number] Date of Birth: [birth year]  Subjective/Objective:     Adm w lv thrombus               Action/Plan: lives w husband, act w milrinone for chf   Expected Discharge Date:                 Expected Discharge Plan:  Home w Home Health Services  In-House Referral:     Discharge planning Services  CM Consult  Post Acute Care Choice:  Resumption of Svcs/PTA Provider Choice offered to:     DME Arranged:  IV pump/equipment DME Agency:  [facility]  HH Arranged:  RN, Disease Management HH Agency:  [facility]  Status of Service:     Medicare Important Message Given:    Date Medicare IM Given:    Medicare IM give by:    Date Additional Medicare IM Given:    Additional Medicare Important Message give by:     If discussed at Long Length of Stay Meetings, dates discussed:    Additional Comments: ur review done  Hanley Hays, RN 06/06/2015, 9:27 AM

## 2015-06-06 NOTE — Progress Notes (Signed)
Echocardiogram Echocardiogram Transesophageal has been performed.  Dorothey Baseman 06/06/2015, 3:43 PM

## 2015-06-06 NOTE — Anesthesia Procedure Notes (Signed)
Procedure Name: MAC Date/Time: 06/06/2015 2:01 PM Performed by: Rosiland Oz Pre-anesthesia Checklist: Patient identified, Timeout performed, Emergency Drugs available, Suction available and Patient being monitored Patient Re-evaluated:Patient Re-evaluated prior to inductionOxygen Delivery Method: Nasal cannula

## 2015-06-06 NOTE — Progress Notes (Signed)
PT Cancellation Note  Patient Details Name: Katrina Peck MRN: [id number] DOB: [birth year]   Cancelled Treatment:    Reason Eval/Treat Not Completed: Patient at procedure or test/unavailable   Off the floor at TEE;  Will follow up later today as time allows;  Otherwise, will follow up for PT tomorrow;   Thank you,  Van Clines, PT  Acute Rehabilitation Services Pager [phone] Office [phone]     Van Clines [facility] 06/06/2015, 2:46 PM

## 2015-06-06 NOTE — Progress Notes (Signed)
PATIENT DETAILS Name: Katrina Peck Age: 71 y.o. Sex: female Date of Birth: April 13, 1945 Admit Date: 06/04/2015 Admitting Physician Lorretta Harp, MD BXI:DHWYS,HUOHFGBMS, MD   Brief narrative: 70 year old female with history of chronic systolic heart failure-on milrinone infusion at home via a tunneled catheter, presented to the hospital with severe lower back pain. Found to have significant leukocytosis. No other site of infection apparent, concern for catheter-related bloodstream infection and discitis.Admitted for further evaluation and treatment  Subjective: Continues to have back pain-seems more comfortable than yesterday  Assessment/Plan: Principal Problem: Sepsis due to gram positive and gram negative bacteremia:Suspicion for catheter related blood stream infection and possible discitis. Tunneled catheter removed by IR on 3/13. Will order MRI with gen anesthesia. TEE planned for later today.Continue empiric antibiotics, infectious disease consulted. See below for further details.  Active Problems: Worsening back pain: Given significant leukocytosis, bacteremia-concern for discitis. Unfortunately not able to lie flat for a MRI (has CHF).Will order MRI with gen anesthesia.   Acute on chronic systolic heart failure (EF 20% by TTE on December 2016): Hx of Nonischemic cardiomyopathy.LHC(4/16) with no significant coronary disease. Continues to have mild overload on exam- weight decreased to 99 lbs (103 pounds on admission),-1.7 L so far. Continue Lasix, milrinone and digoxin. CHF team following  History of LV thrombus: On chronic Coumadin. Pharmacy dosing.  Paroxysmal atrial fibrillation: Rate controlled with amiodarone, on chronic Coumadin.CHADS2VASC 5.  Chronic pericardial effusion: s/p window 07/26/14-cytology was negative. CT abdomen done on admission shows a large pericardial effusion.TEE planned for later today  Pul XJD:BZMCEY secondary to LV failure  Nodular  density in left mid lung on chest x-ray: CT chest when more stable.  Protein-calorie malnutrition, severe:nutriton evaluation  Disposition: Remain inpatient  Antimicrobial agents  See below  Anti-infectives    Start     Dose/Rate Route Frequency Ordered Stop   06/06/15 0400  vancomycin (VANCOCIN) IVPB 750 mg/150 ml premix     750 mg 150 mL/hr over 60 Minutes Intravenous Every 24 hours 06/05/15 0302     06/05/15 1400  piperacillin-tazobactam (ZOSYN) IVPB 3.375 g  Status:  Discontinued     3.375 g 12.5 mL/hr over 240 Minutes Intravenous 3 times per day 06/05/15 0302 06/05/15 1202   06/05/15 1215  cefTRIAXone (ROCEPHIN) 2 g in dextrose 5 % 50 mL IVPB     2 g 100 mL/hr over 30 Minutes Intravenous Every 24 hours 06/05/15 1202     06/05/15 0315  piperacillin-tazobactam (ZOSYN) IVPB 3.375 g     3.375 g 100 mL/hr over 30 Minutes Intravenous STAT 06/05/15 0302 06/05/15 0610   06/05/15 0315  vancomycin (VANCOCIN) IVPB 1000 mg/200 mL premix     1,000 mg 200 mL/hr over 60 Minutes Intravenous NOW 06/05/15 0302 06/05/15 0533      DVT Prophylaxis: Coumadin  Code Status: Full code   Family Communication Spouse at bedside  Procedures: None  CONSULTS:  cardiology and ID  Time spent 35 minutes-Greater than 50% of this time was spent in counseling, explanation of diagnosis, planning of further management, and coordination of care.  MEDICATIONS: Scheduled Meds: . amiodarone  100 mg Oral Daily  . cefTRIAXone (ROCEPHIN)  IV  2 g Intravenous Q24H  . chlorhexidine      . digoxin  0.0625 mg Oral Daily  . feeding supplement (ENSURE ENLIVE)  237 mL Oral BID BM  . furosemide  40 mg Intravenous BID  . lidocaine      .  magnesium oxide  200 mg Oral BID  . polyethylene glycol  17 g Oral Daily  . potassium chloride  20 mEq Oral Daily  . sodium chloride flush  3 mL Intravenous Q12H  . vancomycin  750 mg Intravenous Q24H  . warfarin  7.5 mg Oral ONCE-1800  . Warfarin - Pharmacist Dosing  Inpatient   Does not apply q1800   Continuous Infusions: . heparin 1,200 Units/hr (06/06/15 1302)  . milrinone 0.25 mcg/kg/min (06/06/15 0352)   PRN Meds:.acetaminophen **OR** acetaminophen, HYDROmorphone (DILAUDID) injection, methocarbamol, morphine injection, oxyCODONE-acetaminophen, sodium chloride flush    PHYSICAL EXAM: Vital signs in last 24 hours: Filed Vitals:   06/06/15 0823 06/06/15 0900 06/06/15 1000 06/06/15 1203  BP: 118/86 128/91 113/88 119/94  Pulse: 92 98 90 91  Temp: 98.4 F (36.9 C)   98.3 F (36.8 C)  TempSrc: Oral   Oral  Resp: 16   15  Height:      Weight:      SpO2: 100% 100% 100% 100%    Weight change: -1.721 kg (-3 lb 12.7 oz) Filed Weights   06/04/15 1756 06/05/15 1630  Weight: 46.72 kg (103 lb) 45 kg (99 lb 3.3 oz)   Body mass index is 18.14 kg/(m^2).   Gen Exam: Awake and alert with clear speech.   Neck: Supple, + JVD.   Chest: few bibasilar rales-but otherwise clear CVS: S1 S2 Regular Abdomen: soft, BS +, non tender, non distended.  Extremities: trace edema, lower extremities warm to touch,+chronic venous stasis changes Neurologic:gen weakness-but not focal Skin: No Rash.   Wounds: N/A.    Intake/Output from previous day:  Intake/Output Summary (Last 24 hours) at 06/06/15 1327 Last data filed at 06/06/15 1300  Gross per 24 hour  Intake 443.41 ml  Output   1950 ml  Net -1506.59 ml     LAB RESULTS: CBC  Recent Labs Lab 06/04/15 2048 06/05/15 0330 06/06/15 0424  WBC 24.9* 21.4* 19.9*  HGB 11.4* 10.5* 10.1*  HCT 35.5* 32.7* 32.0*  PLT 235 209 225  MCV 84.1 84.5 84.4  MCH 27.0 27.1 26.6  MCHC 32.1 32.1 31.6  RDW 15.7* 15.8* 15.6*    Chemistries   Recent Labs Lab 06/04/15 2048 06/05/15 0330 06/06/15 0424  NA 142 139 134*  K 4.6 4.6 3.8  CL 103 105 101  CO2 20* 23 25  GLUCOSE 129* 133* 116*  BUN 19 19 19   CREATININE 1.07* 1.09* 0.92  CALCIUM 9.8 9.1 8.8*    CBG:  Recent Labs Lab 06/06/15 0825  GLUCAP  118*    GFR Estimated Creatinine Clearance: 41.6 mL/min (by C-G formula based on Cr of 0.92).  Coagulation profile  Recent Labs Lab 06/04/15 2048 06/05/15 0330 06/06/15 0817  INR 1.72* 1.84* 1.81*    Cardiac Enzymes No results for input(s): CKMB, TROPONINI, MYOGLOBIN in the last 168 hours.  Invalid input(s): CK  Invalid input(s): POCBNP No results for input(s): DDIMER in the last 72 hours. No results for input(s): HGBA1C in the last 72 hours. No results for input(s): CHOL, HDL, LDLCALC, TRIG, CHOLHDL, LDLDIRECT in the last 72 hours. No results for input(s): TSH, T4TOTAL, T3FREE, THYROIDAB in the last 72 hours.  Invalid input(s): FREET3 No results for input(s): VITAMINB12, FOLATE, FERRITIN, TIBC, IRON, RETICCTPCT in the last 72 hours. No results for input(s): LIPASE, AMYLASE in the last 72 hours.  Urine Studies No results for input(s): UHGB, CRYS in the last 72 hours.  Invalid input(s): UACOL, UAPR, USPG, UPH, UTP,  UGL, UKET, UBIL, UNIT, UROB, ULEU, UEPI, UWBC, URBC, UBAC, CAST, UCOM, BILUA  MICROBIOLOGY: Recent Results (from the past 240 hour(s))  Blood culture (routine x 2)     Status: None (Preliminary result)   Collection Time: 06/04/15 10:40 PM  Result Value Ref Range Status   Specimen Description BLOOD LEFT ANTECUBITAL  Final   Special Requests BOTTLES DRAWN AEROBIC AND ANAEROBIC 5CC  Final   Culture  Setup Time   Final    GRAM POSITIVE COCCI IN CLUSTERS IN BOTH AEROBIC AND ANAEROBIC BOTTLES CRITICAL RESULT CALLED TO, READ BACK BY AND VERIFIED WITH: K MOON 06/05/15 @ 1447 M VESTAL GRAM NEGATIVE RODS ANAEROBIC BOTTLE ONLY CRITICAL RESULT CALLED TO, READ BACK BY AND VERIFIED WITH: RN Tilden Fossa AT 1741 [id number] MARTINB CONFIRMED BY VINCE W    Culture STAPHYLOCOCCUS AUREUS GRAM NEGATIVE RODS   Final   Report Status PENDING  Incomplete  Blood culture (routine x 2)     Status: None (Preliminary result)   Collection Time: 06/04/15 10:45 PM  Result Value Ref Range  Status   Specimen Description BLOOD LEFT HAND  Final   Special Requests BOTTLES DRAWN AEROBIC AND ANAEROBIC 5CC  Final   Culture  Setup Time   Final    GRAM POSITIVE COCCI IN CLUSTERS IN BOTH AEROBIC AND ANAEROBIC BOTTLES CRITICAL RESULT CALLED TO, READ BACK BY AND VERIFIED WITH: K MOON 06/05/15 @ 1447 M VESTAL GRAM NEGATIVE RODS ANAEROBIC BOTTLE ONLY CRITICAL RESULT CALLED TO, READ BACK BY AND VERIFIED WITH: RN Tilden Fossa AT 1741 [id number] MARTINB CONFIRMED BY VINCE W    Culture STAPHYLOCOCCUS AUREUS GRAM NEGATIVE RODS   Final   Report Status PENDING  Incomplete  MRSA PCR Screening     Status: None   Collection Time: 06/05/15  5:51 PM  Result Value Ref Range Status   MRSA by PCR NEGATIVE NEGATIVE Final    Comment:        The GeneXpert MRSA Assay (FDA approved for NASAL specimens only), is one component of a comprehensive MRSA colonization surveillance program. It is not intended to diagnose MRSA infection nor to guide or monitor treatment for MRSA infections.   Culture, blood (routine x 2)     Status: None (Preliminary result)   Collection Time: 06/05/15  6:06 PM  Result Value Ref Range Status   Specimen Description BLOOD LEFT ANTECUBITAL  Final   Special Requests BOTTLES DRAWN AEROBIC AND ANAEROBIC 5CC  Final   Culture NO GROWTH < 24 HOURS  Final   Report Status PENDING  Incomplete  Culture, blood (routine x 2)     Status: None (Preliminary result)   Collection Time: 06/05/15  6:08 PM  Result Value Ref Range Status   Specimen Description BLOOD LEFT HAND  Final   Special Requests BOTTLES DRAWN AEROBIC AND ANAEROBIC 5CC  Final   Culture NO GROWTH < 24 HOURS  Final   Report Status PENDING  Incomplete    RADIOLOGY STUDIES/RESULTS: Ct Abdomen Pelvis W Contrast  06/05/2015  CLINICAL DATA:  Back spasms.  Leukocytosis. EXAM: CT ABDOMEN AND PELVIS WITH CONTRAST TECHNIQUE: Multidetector CT imaging of the abdomen and pelvis was performed using the standard protocol following bolus  administration of intravenous contrast. CONTRAST:  75mL OMNIPAQUE IOHEXOL 300 MG/ML  SOLN COMPARISON:  None. FINDINGS: Lower chest and abdominal wall: Chronic cardiomegaly. Remote subendocardial left ventricular infarct, greatest along the anterior wall. Chronic large pericardial effusion when compared to previous CT. Chronic small right pleural effusion with atelectasis or  scar. Hepatobiliary: Liver enhancement pattern suggests passive congestion. No focal lesion.Cholelithiasis without evidence of acute cholecystitis. No duct enlargement Pancreas: Unremarkable. Spleen: Unremarkable. Adrenals/Urinary Tract: Negative adrenals. Bilateral renal cortical thinning. Small nonobstructive bilateral renal calculi. Renal sinus cysts. Chronic hemorrhagic or proteinaceous cyst in the upper pole left kidney measuring 13 mm, also seen on April 2016 chest CT. Unremarkable decompressed bladder. Reproductive:Multiple uterine fibroids measuring up to 23 mm. Stomach/Bowel: No obstruction. Colonic diverticulosis without active inflammation. No evidence of appendicitis. Vascular/Lymphatic: No acute vascular abnormality. No mass or adenopathy. Peritoneal: Small ascites Musculoskeletal: No acute abnormalities. IMPRESSION: 1. No acute finding. 2. Remote left ventricular infarct with chronic cardiomegaly. Chronic large pericardial effusion and small right pleural effusion. 3. Passive congestion of the liver. 4. Cholelithiasis and nephrolithiasis. 5. Colonic diverticulosis. Electronically Signed   By: Marnee Spring M.D.   On: 06/05/2015 01:33   Dg Chest Port 1 View  06/04/2015  CLINICAL DATA:  Shortness of breath EXAM: PORTABLE CHEST 1 VIEW COMPARISON:  03/22/2015 chest radiograph. FINDINGS: Right internal jugular central venous catheter terminates at the cavoatrial junction. Stable cardiomediastinal silhouette with moderate cardiomegaly. No pneumothorax. Small bilateral pleural effusions. Mild pulmonary edema. Mild bibasilar  atelectasis. There is a new subcentimeter nodular density in the peripheral left mid lung. IMPRESSION: 1. Moderate cardiomegaly and mild pulmonary edema, in keeping with mild congestive heart failure. 2. Small bilateral pleural effusions and mild bibasilar atelectasis. 3. New subcentimeter nodular density in the peripheral left mid lung, cannot exclude a pulmonary nodule. Recommend correlation with chest CT on a short term outpatient basis. Electronically Signed   By: Delbert Phenix M.D.   On: 06/04/2015 20:44    Jeoffrey Massed, MD  [facility] Pager:[phone]  If 7PM-7AM, please contact night-coverage [email or web address] Password [email or web address] 06/06/2015, 1:27 PM   LOS: 1 day

## 2015-06-06 NOTE — Progress Notes (Signed)
ANTICOAGULATION CONSULT NOTE - Follow Up Consult  Pharmacy Consult for heparin bridge to warfarin Indication: atrial fibrillation and history of LV thrombus  No Known Allergies  Patient Measurements: Height: 5\' 2"  (157.5 cm) Weight: 99 lb 3.3 oz (45 kg) IBW/kg (Calculated) : 50.1 Heparin Dosing Weight:46  Vital Signs: Temp: 98.5 F (36.9 C) (03/13 1927) Temp Source: Oral (03/13 1927) BP: 120/86 mmHg (03/13 1927) Pulse Rate: 97 (03/13 1927)  Labs:  Recent Labs  06/04/15 2048 06/05/15 0330 06/05/15 2230 06/06/15 0424 06/06/15 0817 06/06/15 2005  HGB 11.4* 10.5*  --  10.1*  --   --   HCT 35.5* 32.7*  --  32.0*  --   --   PLT 235 209  --  225  --   --   LABPROT 20.1* 21.2*  --   --  21.0*  --   INR 1.72* 1.84*  --   --  1.81*  --   HEPARINUNFRC  --   --  <0.10*  --  <0.10* 0.27*  CREATININE 1.07* 1.09*  --  0.92  --   --     Estimated Creatinine Clearance: 41.6 mL/min (by C-G formula based on Cr of 0.92).  Assessment: 70 year old female with afib on chronic coumadin (INR 1.8) and history of LV thrombus. Currently on IV heparin until INR therapeutic. Repeat HL this evening remains sub-therapeutic at 0.27. Will not bolus since INR is elevated. INR today 1.81, HL < 0.1, Hgb 10.1. RN reports no s/s of bleeding   Goal of Therapy:  INR 2-3 Heparin level 0.3-0.7 units/ml Monitor platelets by anticoagulation protocol: Yes   Plan:  Increase heparin to 1300 units/hr Check HL in 6 hours then daily CBC daily  Vinnie Level, PharmD., BCPS Clinical Pharmacist Pager [phone]

## 2015-06-06 NOTE — Progress Notes (Addendum)
Patient ID: Katrina Peck, female   DOB: 1945-07-27, 70 y.o.   MRN: [id number]         [facility]    Date of Admission:  06/04/2015           Day 3 vancomycin        Day 2 ceftriaxone  Principal Problem:   Back pain Active Problems:   Essential hypertension   Pericardial effusion   Chronic systolic heart failure (HCC)   Paroxysmal atrial fibrillation (HCC)   Protein-calorie malnutrition, severe (HCC)   Long term (current) use of anticoagulants [Z79.01]   Sepsis (HCC)   Back spasm   Neck pain   . amiodarone  100 mg Oral Daily  . cefTRIAXone (ROCEPHIN)  IV  2 g Intravenous Q24H  . chlorhexidine      . digoxin  0.0625 mg Oral Daily  . feeding supplement (ENSURE ENLIVE)  237 mL Oral BID BM  . furosemide  40 mg Intravenous BID  . lidocaine      . magnesium oxide  200 mg Oral BID  . polyethylene glycol  17 g Oral Daily  . potassium chloride  20 mEq Oral Daily  . sodium chloride flush  3 mL Intravenous Q12H  . vancomycin  750 mg Intravenous Q24H  . Warfarin - Pharmacist Dosing Inpatient   Does not apply q1800    SUBJECTIVE: She began have severe low back pain about 5 days ago. She had onset of left-sided neck pain 2-3 days ago.  Review of Systems: Review of Systems  Constitutional: Positive for malaise/fatigue. Negative for fever, chills, weight loss and diaphoresis.  HENT: Negative for sore throat.   Respiratory: Positive for shortness of breath. Negative for cough and sputum production.   Cardiovascular: Negative for chest pain.  Gastrointestinal: Negative for nausea, vomiting and diarrhea.  Genitourinary: Negative for dysuria.  Musculoskeletal: Positive for back pain and neck pain. Negative for myalgias and joint pain.  Skin: Negative for rash.    Past Medical History  Diagnosis Date  . Anemia   . Hypertension   . Blood transfusion without reported diagnosis   . Chronic systolic CHF (congestive heart failure) (HCC)     a. 06/2014  Echo: EF 20%.  Marland Kitchen NICM (nonischemic cardiomyopathy) (HCC)     a. 06/2014 Echo: EF 20%, diff HK, mild MR, mildly dil LA/RA, mildly reduced RV fxn;  b. 06/2014 Cath: nl cors.  . Pericardial effusion     a. 06/2014 Large effusion, no tamponade phys.    Social History  Substance Use Topics  . Smoking status: Never Smoker   . Smokeless tobacco: Never Used  . Alcohol Use: No    Family History  Problem Relation Age of Onset  . Hypertension Sister   . Diabetes Maternal Grandmother   . Cancer Mother     died young (pt was only in [employer] @ the time)  . Other Father     died in his 54's.   No Known Allergies  OBJECTIVE: Filed Vitals:   06/06/15 0823 06/06/15 0900 06/06/15 1000 06/06/15 1203  BP: 118/86 128/91 113/88 119/94  Pulse: 92 98 90 91  Temp: 98.4 F (36.9 C)   98.3 F (36.8 C)  TempSrc: Oral   Oral  Resp: 16   15  Height:      Weight:      SpO2: 100% 100% 100% 100%   Body mass index is 18.14 kg/(m^2).  Physical Exam  Constitutional: She  is oriented to person, place, and time.  She is sitting up in bed. She is alert but uncomfortable due to pain.  Cardiovascular: Normal rate and regular rhythm.   No murmur heard. Pulmonary/Chest: Effort normal and breath sounds normal.  Abdominal: Soft. There is no tenderness.  Musculoskeletal: Normal range of motion.  Neurological: She is alert and oriented to person, place, and time.  Skin: No rash noted.  Psychiatric: Mood and affect normal.    Lab Results Lab Results  Component Value Date   WBC 19.9* 06/06/2015   HGB 10.1* 06/06/2015   HCT 32.0* 06/06/2015   MCV 84.4 06/06/2015   PLT 225 06/06/2015    Lab Results  Component Value Date   CREATININE 0.92 06/06/2015   BUN 19 06/06/2015   NA 134* 06/06/2015   K 3.8 06/06/2015   CL 101 06/06/2015   CO2 25 06/06/2015    Lab Results  Component Value Date   ALT 25 06/05/2015   AST 21 06/05/2015   ALKPHOS 130* 06/05/2015   BILITOT 2.4* 06/05/2015      Microbiology: Recent Results (from the past 240 hour(s))  Blood culture (routine x 2)     Status: None (Preliminary result)   Collection Time: 06/04/15 10:40 PM  Result Value Ref Range Status   Specimen Description BLOOD LEFT ANTECUBITAL  Final   Special Requests BOTTLES DRAWN AEROBIC AND ANAEROBIC 5CC  Final   Culture  Setup Time   Final    GRAM POSITIVE COCCI IN CLUSTERS IN BOTH AEROBIC AND ANAEROBIC BOTTLES CRITICAL RESULT CALLED TO, READ BACK BY AND VERIFIED WITH: K MOON 06/05/15 @ 1447 M VESTAL GRAM NEGATIVE RODS ANAEROBIC BOTTLE ONLY CRITICAL RESULT CALLED TO, READ BACK BY AND VERIFIED WITH: RN M [facility] AT 1741 [id number] MARTINB CONFIRMED BY VINCE W    Culture STAPHYLOCOCCUS AUREUS GRAM NEGATIVE RODS   Final   Report Status PENDING  Incomplete  Blood culture (routine x 2)     Status: None (Preliminary result)   Collection Time: 06/04/15 10:45 PM  Result Value Ref Range Status   Specimen Description BLOOD LEFT HAND  Final   Special Requests BOTTLES DRAWN AEROBIC AND ANAEROBIC 5CC  Final   Culture  Setup Time   Final    GRAM POSITIVE COCCI IN CLUSTERS IN BOTH AEROBIC AND ANAEROBIC BOTTLES CRITICAL RESULT CALLED TO, READ BACK BY AND VERIFIED WITH: K MOON 06/05/15 @ 1447 M VESTAL GRAM NEGATIVE RODS ANAEROBIC BOTTLE ONLY CRITICAL RESULT CALLED TO, READ BACK BY AND VERIFIED WITH: RN Tilden Fossa AT [id number] MARTINB CONFIRMED BY VINCE W    Culture STAPHYLOCOCCUS AUREUS GRAM NEGATIVE RODS   Final   Report Status PENDING  Incomplete  MRSA PCR Screening     Status: None   Collection Time: 06/05/15  5:51 PM  Result Value Ref Range Status   MRSA by PCR NEGATIVE NEGATIVE Final    Comment:        The GeneXpert MRSA Assay (FDA approved for NASAL specimens only), is one component of a comprehensive MRSA colonization surveillance program. It is not intended to diagnose MRSA infection nor to guide or monitor treatment for MRSA infections.   Culture, blood (routine x 2)      Status: None (Preliminary result)   Collection Time: 06/05/15  6:06 PM  Result Value Ref Range Status   Specimen Description BLOOD LEFT ANTECUBITAL  Final   Special Requests BOTTLES DRAWN AEROBIC AND ANAEROBIC 5CC  Final   Culture NO GROWTH < 24  HOURS  Final   Report Status PENDING  Incomplete  Culture, blood (routine x 2)     Status: None (Preliminary result)   Collection Time: 06/05/15  6:08 PM  Result Value Ref Range Status   Specimen Description BLOOD LEFT HAND  Final   Special Requests BOTTLES DRAWN AEROBIC AND ANAEROBIC 5CC  Final   Culture NO GROWTH < 24 HOURS  Final   Report Status PENDING  Incomplete     ASSESSMENT: She has polymicrobial bacteremia with staph aureus and gram-negative rods most likely from her recently removed central line. I agree with MRI with sedation. Given her low back and neck pain I would scan her cervical, thoracic and lumbar spines.  PLAN: 1. Continue current antibiotics pending final blood culture results 2. Agree with TEE  3.   Cliffton Asters, MD [facility] [phone] pager   [phone] cell 06/06/2015, 12:08 PM

## 2015-06-06 NOTE — CV Procedure (Signed)
    TRANSESOPHAGEAL ECHOCARDIOGRAM   NAME:  Katrina Peck   MRN: [id number] DOB:  [birth year]   ADMIT DATE: 06/04/2015  INDICATIONS:   PROCEDURE:   Informed consent was obtained prior to the procedure. The risks, benefits and alternatives for the procedure were discussed and the patient comprehended these risks.  Risks include, but are not limited to, cough, sore throat, vomiting, nausea, somnolence, esophageal and stomach trauma or perforation, bleeding, low blood pressure, aspiration, pneumonia, infection, trauma to the teeth and death.    After a procedural time-out, the patient was sedated by the anesthesia service. The transesophageal probe was inserted in the esophagus and stomach without difficulty and multiple views were obtained.    COMPLICATIONS:    There were no immediate complications.  FINDINGS:  LEFT VENTRICLE: Markedly dilated.  EF = 10%. Diffuse HK.  RIGHT VENTRICLE: Dilated. Severely HK.  LEFT ATRIUM: Dilated. Heavy smoke  LEFT ATRIAL APPENDAGE: Heavy smoke. No thrombus.   RIGHT ATRIUM: Dilated  AORTIC VALVE:  Trileaflet. Trivial AI. No vegetation.   MITRAL VALVE:    Normal. Mild MR. No vegetation.  TRICUSPID VALVE: Normal. Moderate MR. No vegetation.  PULMONIC VALVE: Mild PI. No vegetation  INTERATRIAL SEPTUM: No PFO or ASD.  PERICARDIUM: Moderate sized loculated effusion adjacent to RA.  DESCENDING AORTA: Moderate plaque.   CONCLUSION:  No TEE evidence of endocarditis.   Bensimhon, Daniel,MD 2:40 PM

## 2015-06-06 NOTE — Progress Notes (Signed)
OT Cancellation Note    06/06/15 1500  OT Visit Information  Last OT Received On 06/06/15  Reason Eval/Treat Not Completed Patient at procedure or test/ unavailable  [facility], OTR/L  [phone] 06/06/2015

## 2015-06-06 NOTE — Progress Notes (Signed)
ANTICOAGULATION CONSULT NOTE - Follow Up Consult  Pharmacy Consult for heparin bridge to warfarin Indication: atrial fibrillation and history of LV thrombus  No Known Allergies  Patient Measurements: Height: 5\' 2"  (157.5 cm) Weight: 99 lb 3.3 oz (45 kg) IBW/kg (Calculated) : 50.1 Heparin Dosing Weight:46  Vital Signs: Temp: 98.3 F (36.8 C) (03/13 1203) Temp Source: Oral (03/13 1203) BP: 119/94 mmHg (03/13 1203) Pulse Rate: 91 (03/13 1203)  Labs:  Recent Labs  06/04/15 2048 06/05/15 0330 06/05/15 2230 06/06/15 0424 06/06/15 0817  HGB 11.4* 10.5*  --  10.1*  --   HCT 35.5* 32.7*  --  32.0*  --   PLT 235 209  --  225  --   LABPROT 20.1* 21.2*  --   --  21.0*  INR 1.72* 1.84*  --   --  1.81*  HEPARINUNFRC  --   --  <0.10*  --  <0.10*  CREATININE 1.07* 1.09*  --  0.92  --     Estimated Creatinine Clearance: 41.6 mL/min (by C-G formula based on Cr of 0.92).  Assessment: 70 year old female with afib on chronic coumadin (INR 1.8) and history of LV thrombus. New orders for heparin bridge until INR therapeutic. Will not bolus since INR is elevated. INR today 1.81, HL < 0.1, Hgb 10.1  Goal of Therapy:  INR 2-3 Heparin level 0.3-0.7 units/ml Monitor platelets by anticoagulation protocol: Yes   Plan:  Increase heparin to 1200 units/hr Check HL in 6 hours then daily CBC daily Warfarin 7.5mg  *1 for tonight  Sherron Monday, PharmD Clinical Pharmacy Resident Pager: [phone] 06/06/2015 12:31 PM

## 2015-06-06 NOTE — Progress Notes (Signed)
[facility]  Patient Status: Active pt prior to this readmission  [facility] is providing the following services:   HHRN and Home Inotrope Pharmacy team for home Milrinone.  [facility] hospital team will follow [name] while inpatient to support transition to home when ordered. If patient discharges after hours, please call [phone].   Katrina Peck 06/06/2015, 10:48 AM

## 2015-06-06 NOTE — Anesthesia Postprocedure Evaluation (Signed)
Anesthesia Post Note  Patient: Katrina Peck  Procedure(s) Performed: Procedure(s) (LRB): TRANSESOPHAGEAL ECHOCARDIOGRAM (TEE) (N/A)  Patient location during evaluation: PACU Anesthesia Type: MAC Level of consciousness: awake and alert Pain management: pain level controlled Vital Signs Assessment: post-procedure vital signs reviewed and stable Respiratory status: spontaneous breathing, nonlabored ventilation, respiratory function stable and patient connected to nasal cannula oxygen Cardiovascular status: stable and blood pressure returned to baseline Anesthetic complications: no    Last Vitals:  Filed Vitals:   06/06/15 1447 06/06/15 1450  BP: 134/94 134/94  Pulse: 99 95  Temp: 36.8 C   Resp: 23 23    Last Pain:  Filed Vitals:   06/06/15 1458  PainSc: 8                  Juma Oxley,W. EDMOND

## 2015-06-06 NOTE — Anesthesia Preprocedure Evaluation (Addendum)
Anesthesia Evaluation  Patient identified by MRN, date of birth, ID band Patient awake    Reviewed: Allergy & Precautions, H&P , NPO status , Patient's Chart, lab work & pertinent test results  Airway Mallampati: II  TM Distance: >3 FB Neck ROM: Full    Dental no notable dental hx. (+) Teeth Intact, Dental Advisory Given   Pulmonary neg pulmonary ROS,    Pulmonary exam normal breath sounds clear to auscultation       Cardiovascular hypertension, Pt. on medications +CHF  + dysrhythmias Atrial Fibrillation  Rhythm:Regular Rate:Normal     Neuro/Psych negative neurological ROS  negative psych ROS   GI/Hepatic negative GI ROS, Neg liver ROS,   Endo/Other  negative endocrine ROS  Renal/GU negative Renal ROS  negative genitourinary   Musculoskeletal   Abdominal   Peds  Hematology negative hematology ROS (+) anemia ,   Anesthesia Other Findings   Reproductive/Obstetrics negative OB ROS                            Anesthesia Physical Anesthesia Plan  ASA: IV  Anesthesia Plan: MAC   Post-op Pain Management:    Induction: Intravenous  Airway Management Planned: Nasal Cannula  Additional Equipment:   Intra-op Plan:   Post-operative Plan:   Informed Consent: I have reviewed the patients History and Physical, chart, labs and discussed the procedure including the risks, benefits and alternatives for the proposed anesthesia with the patient or authorized representative who has indicated his/her understanding and acceptance.   Dental advisory given  Plan Discussed with: CRNA  Anesthesia Plan Comments:         Anesthesia Quick Evaluation

## 2015-06-06 NOTE — Progress Notes (Signed)
Patient ID: Katrina Peck, female   DOB: [birth year], 70 y.o.   MRN: [id number]     SUBJECTIVE: Still with back and neck pain.  Sitting up in bed.  States "always short of breath, no change."  Got IV Lasix yesterday, I/Os negative.  No fever.   GPCs in blood.   Scheduled Meds: . amiodarone  100 mg Oral Daily  . cefTRIAXone (ROCEPHIN)  IV  2 g Intravenous Q24H  . digoxin  0.0625 mg Oral Daily  . feeding supplement (ENSURE ENLIVE)  237 mL Oral BID BM  . furosemide  40 mg Intravenous BID  . magnesium oxide  200 mg Oral BID  . polyethylene glycol  17 g Oral Daily  . potassium chloride  20 mEq Oral Daily  . sodium chloride flush  3 mL Intravenous Q12H  . vancomycin  750 mg Intravenous Q24H  . Warfarin - Pharmacist Dosing Inpatient   Does not apply q1800   Continuous Infusions: . heparin 1,000 Units/hr (06/05/15 2352)  . milrinone 0.25 mcg/kg/min (06/06/15 0352)   PRN Meds:.acetaminophen **OR** acetaminophen, HYDROmorphone (DILAUDID) injection, methocarbamol, morphine injection, oxyCODONE-acetaminophen, sodium chloride flush    Filed Vitals:   06/06/15 0315 06/06/15 0400 06/06/15 0500 06/06/15 0600  BP:  116/88 119/88 115/85  Pulse:  97 93 92  Temp: 98.2 F (36.8 C)     TempSrc: Oral     Resp:      Height:      Weight:      SpO2:  98% 99% 100%    Intake/Output Summary (Last 24 hours) at 06/06/15 0729 Last data filed at 06/06/15 0600  Gross per 24 hour  Intake 733.41 ml  Output   1500 ml  Net -766.59 ml    LABS: Basic Metabolic Panel:  Recent Labs  67/54/49 0330 06/06/15 0424  NA 139 134*  K 4.6 3.8  CL 105 101  CO2 23 25  GLUCOSE 133* 116*  BUN 19 19  CREATININE 1.09* 0.92  CALCIUM 9.1 8.8*   Liver Function Tests:  Recent Labs  06/04/15 2048 06/05/15 0330  AST 21 21  ALT 28 25  ALKPHOS 146* 130*  BILITOT 2.7* 2.4*  PROT 7.9 6.7  ALBUMIN 3.2* 2.9*   No results for input(s): LIPASE, AMYLASE in the last 72 hours. CBC:  Recent Labs   06/05/15 0330 06/06/15 0424  WBC 21.4* 19.9*  HGB 10.5* 10.1*  HCT 32.7* 32.0*  MCV 84.5 84.4  PLT 209 225   Cardiac Enzymes: No results for input(s): CKTOTAL, CKMB, CKMBINDEX, TROPONINI in the last 72 hours. BNP: Invalid input(s): POCBNP D-Dimer: No results for input(s): DDIMER in the last 72 hours. Hemoglobin A1C: No results for input(s): HGBA1C in the last 72 hours. Fasting Lipid Panel: No results for input(s): CHOL, HDL, LDLCALC, TRIG, CHOLHDL, LDLDIRECT in the last 72 hours. Thyroid Function Tests: No results for input(s): TSH, T4TOTAL, T3FREE, THYROIDAB in the last 72 hours.  Invalid input(s): FREET3 Anemia Panel: No results for input(s): VITAMINB12, FOLATE, FERRITIN, TIBC, IRON, RETICCTPCT in the last 72 hours.  RADIOLOGY: Ct Abdomen Pelvis W Contrast  06/05/2015  CLINICAL DATA:  Back spasms.  Leukocytosis. EXAM: CT ABDOMEN AND PELVIS WITH CONTRAST TECHNIQUE: Multidetector CT imaging of the abdomen and pelvis was performed using the standard protocol following bolus administration of intravenous contrast. CONTRAST:  22mL OMNIPAQUE IOHEXOL 300 MG/ML  SOLN COMPARISON:  None. FINDINGS: Lower chest and abdominal wall: Chronic cardiomegaly. Remote subendocardial left ventricular infarct, greatest along the anterior wall. Chronic  large pericardial effusion when compared to previous CT. Chronic small right pleural effusion with atelectasis or scar. Hepatobiliary: Liver enhancement pattern suggests passive congestion. No focal lesion.Cholelithiasis without evidence of acute cholecystitis. No duct enlargement Pancreas: Unremarkable. Spleen: Unremarkable. Adrenals/Urinary Tract: Negative adrenals. Bilateral renal cortical thinning. Small nonobstructive bilateral renal calculi. Renal sinus cysts. Chronic hemorrhagic or proteinaceous cyst in the upper pole left kidney measuring 13 mm, also seen on April 2016 chest CT. Unremarkable decompressed bladder. Reproductive:Multiple uterine fibroids  measuring up to 23 mm. Stomach/Bowel: No obstruction. Colonic diverticulosis without active inflammation. No evidence of appendicitis. Vascular/Lymphatic: No acute vascular abnormality. No mass or adenopathy. Peritoneal: Small ascites Musculoskeletal: No acute abnormalities. IMPRESSION: 1. No acute finding. 2. Remote left ventricular infarct with chronic cardiomegaly. Chronic large pericardial effusion and small right pleural effusion. 3. Passive congestion of the liver. 4. Cholelithiasis and nephrolithiasis. 5. Colonic diverticulosis. Electronically Signed   By: Marnee Spring M.D.   On: 06/05/2015 01:33   Dg Chest Port 1 View  06/04/2015  CLINICAL DATA:  Shortness of breath EXAM: PORTABLE CHEST 1 VIEW COMPARISON:  03/22/2015 chest radiograph. FINDINGS: Right internal jugular central venous catheter terminates at the cavoatrial junction. Stable cardiomediastinal silhouette with moderate cardiomegaly. No pneumothorax. Small bilateral pleural effusions. Mild pulmonary edema. Mild bibasilar atelectasis. There is a new subcentimeter nodular density in the peripheral left mid lung. IMPRESSION: 1. Moderate cardiomegaly and mild pulmonary edema, in keeping with mild congestive heart failure. 2. Small bilateral pleural effusions and mild bibasilar atelectasis. 3. New subcentimeter nodular density in the peripheral left mid lung, cannot exclude a pulmonary nodule. Recommend correlation with chest CT on a short term outpatient basis. Electronically Signed   By: Delbert Phenix M.D.   On: 06/04/2015 20:44    PHYSICAL EXAM General: Uncomfortable.  Neck: JVP difficult but likely around 10 cm, no thyromegaly or thyroid nodule.  Lungs: Decreased breath sounds at bases bilaterally.  CV: Lateral PMI.  Heart regular S1/S2, no S3/S4, no murmur.  No peripheral edema.   Abdomen: Soft, nontender, no hepatosplenomegaly, no distention.  Neurologic: Alert and oriented x 3.  Psych: Normal affect. Extremities: No clubbing or  cyanosis.   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  WBCs high, concern for tunneled catheter infection with possible spread cause discitis/back pain.  GPCs in blood.  1. ID: GPC bacteremia.  ID following.  Afebrile this morning, on vancomycin and ceftriaxone.  Concern that back pain could represent discitis.  - Once she has alternative IV access, she will need tunneled catheter removed (likely source).  IV team to try to place peripheral IVs, if unable will need CVL.  - Continue abx.  - Should get TEE to assess for endocarditis with GPCs.  - Will need MRI of back to look for discitis, will have to be sedated.  2. Acute on chronic systolic CHF: Last echo with EF 20%, moderate pericardial effusion.  She is milrinone dependent. Some volume overload on exam with dyspnea.  - Will need to continue milrinone => via PIV or CVL after tunneled catheter is out.  - Continue Lasix 40 mg IV bid.  - Continue digoxin.  3. LV mural thrombus: INR subtherapeutic, now on heparin/coumadin.  INR today.   [name] 06/06/2015 7:36 AM

## 2015-06-06 NOTE — Interval H&P Note (Signed)
History and Physical Interval Note:  06/06/2015 2:25 PM  Katrina Peck  has presented today for surgery, with the diagnosis of bacteremia  The various methods of treatment have been discussed with the patient and family. After consideration of risks, benefits and other options for treatment, the patient has consented to  Procedure(s): TRANSESOPHAGEAL ECHOCARDIOGRAM (TEE) (N/A) as a surgical intervention .  The patient's history has been reviewed, patient examined, no change in status, stable for surgery.  I have reviewed the patient's chart and labs.  Questions were answered to the patient's satisfaction.     Ryin Schillo, Reuel Boom

## 2015-06-06 NOTE — Transfer of Care (Signed)
Immediate Anesthesia Transfer of Care Note  Patient: Katrina Peck  Procedure(s) Performed: Procedure(s): TRANSESOPHAGEAL ECHOCARDIOGRAM (TEE) (N/A)  Patient Location: PACU and Endoscopy Unit  Anesthesia Type:MAC  Level of Consciousness: sedated and patient cooperative  Airway & Oxygen Therapy: Patient Spontanous Breathing and Patient connected to nasal cannula oxygen  Post-op Assessment: Report given to RN, Post -op Vital signs reviewed and stable and Patient moving all extremities X 4  Post vital signs: Reviewed and stable  Last Vitals:  Filed Vitals:   06/06/15 1331 06/06/15 1447  BP: 115/84 134/94  Pulse: 91 99  Temp: 36.3 C   Resp: 19 23    Complications: No apparent anesthesia complications

## 2015-06-06 NOTE — Progress Notes (Signed)
Dr Jerral Ralph wanted MRI to be made aware of pt needing sedation for her MRI and stated it was okay to do tomorrow or the next day. Attempted to call MRI department several times since speaking with MD, able to reach someone at this time.   Spoke with staff member in MRI, pt will need her MRI done under sedation. Staff member states general anesthesia available on Tuesdays and Thursdays for sedation procedures. Will note pt needs and attempt to do tomorrow Tuesday 3/14 if schedule allows.

## 2015-06-07 DIAGNOSIS — E440 Moderate protein-calorie malnutrition: Secondary | ICD-10-CM | POA: Insufficient documentation

## 2015-06-07 DIAGNOSIS — I480 Paroxysmal atrial fibrillation: Secondary | ICD-10-CM

## 2015-06-07 DIAGNOSIS — I5022 Chronic systolic (congestive) heart failure: Secondary | ICD-10-CM

## 2015-06-07 DIAGNOSIS — A419 Sepsis, unspecified organism: Secondary | ICD-10-CM

## 2015-06-07 DIAGNOSIS — D72829 Elevated white blood cell count, unspecified: Secondary | ICD-10-CM

## 2015-06-07 DIAGNOSIS — M5489 Other dorsalgia: Secondary | ICD-10-CM

## 2015-06-07 DIAGNOSIS — I5041 Acute combined systolic (congestive) and diastolic (congestive) heart failure: Secondary | ICD-10-CM | POA: Insufficient documentation

## 2015-06-07 DIAGNOSIS — D62 Acute posthemorrhagic anemia: Secondary | ICD-10-CM

## 2015-06-07 DIAGNOSIS — M5441 Lumbago with sciatica, right side: Secondary | ICD-10-CM

## 2015-06-07 DIAGNOSIS — I10 Essential (primary) hypertension: Secondary | ICD-10-CM

## 2015-06-07 LAB — GLUCOSE, CAPILLARY: GLUCOSE-CAPILLARY: 96 mg/dL (ref 65–99)

## 2015-06-07 LAB — CULTURE, BLOOD (ROUTINE X 2)

## 2015-06-07 LAB — HEPARIN LEVEL (UNFRACTIONATED): HEPARIN UNFRACTIONATED: 0.46 [IU]/mL (ref 0.30–0.70)

## 2015-06-07 LAB — BASIC METABOLIC PANEL
Anion gap: 11 (ref 5–15)
BUN: 16 mg/dL (ref 6–20)
CALCIUM: 8.7 mg/dL — AB (ref 8.9–10.3)
CHLORIDE: 101 mmol/L (ref 101–111)
CO2: 26 mmol/L (ref 22–32)
CREATININE: 0.91 mg/dL (ref 0.44–1.00)
GLUCOSE: 101 mg/dL — AB (ref 65–99)
Potassium: 3.7 mmol/L (ref 3.5–5.1)
SODIUM: 138 mmol/L (ref 135–145)

## 2015-06-07 LAB — CBC
HCT: 34.6 % — ABNORMAL LOW (ref 36.0–46.0)
Hemoglobin: 10.8 g/dL — ABNORMAL LOW (ref 12.0–15.0)
MCH: 26.3 pg (ref 26.0–34.0)
MCHC: 31.2 g/dL (ref 30.0–36.0)
MCV: 84.4 fL (ref 78.0–100.0)
PLATELETS: 266 10*3/uL (ref 150–400)
RBC: 4.1 MIL/uL (ref 3.87–5.11)
RDW: 15.2 % (ref 11.5–15.5)
WBC: 16.8 10*3/uL — ABNORMAL HIGH (ref 4.0–10.5)

## 2015-06-07 LAB — PROTIME-INR
INR: 2.5 — ABNORMAL HIGH (ref 0.00–1.49)
PROTHROMBIN TIME: 26.7 s — AB (ref 11.6–15.2)

## 2015-06-07 MED ORDER — VANCOMYCIN HCL IN DEXTROSE 750-5 MG/150ML-% IV SOLN
750.0000 mg | INTRAVENOUS | Status: AC
  Administered 2015-06-07: 750 mg via INTRAVENOUS
  Filled 2015-06-07: qty 150

## 2015-06-07 MED ORDER — POTASSIUM CHLORIDE CRYS ER 20 MEQ PO TBCR
20.0000 meq | EXTENDED_RELEASE_TABLET | Freq: Once | ORAL | Status: AC
  Administered 2015-06-07: 20 meq via ORAL
  Filled 2015-06-07: qty 1

## 2015-06-07 MED ORDER — SODIUM CHLORIDE 0.9 % IV SOLN
3.0000 g | Freq: Four times a day (QID) | INTRAVENOUS | Status: DC
  Administered 2015-06-07 – 2015-06-09 (×7): 3 g via INTRAVENOUS
  Filled 2015-06-07 (×13): qty 3

## 2015-06-07 MED ORDER — WARFARIN SODIUM 2 MG PO TABS
4.0000 mg | ORAL_TABLET | Freq: Once | ORAL | Status: AC
  Administered 2015-06-07: 4 mg via ORAL
  Filled 2015-06-07: qty 2

## 2015-06-07 NOTE — Progress Notes (Signed)
Inpatient Rehabilitation  IP Rehab consult has been ordered.  Admissions coordinator will follow up once consult is completed.  Please call if questions.  Weldon Picking PT Inpatient Rehab Admissions Coordinator Cell [phone] Office [phone]

## 2015-06-07 NOTE — Evaluation (Signed)
Occupational Therapy Evaluation Patient Details Name: Katrina Peck MRN: [id number] DOB: 1945/11/13 Today's Date: 06/07/2015    History of Present Illness 70 yo female admitted with severe back pain workup underway to r/o discitis and sepsis. PMH: anemia, HTN, chronic systolic CHF, NICM pericardial effusion   Clinical Impression   PT admitted with back pain. Pt currently with functional limitiations due to the deficits listed below (see OT problem list). PTA independent with all adls. Pt will benefit from skilled OT to increase their independence and safety with adls and balance to allow discharge SNF due to pain limiting and bed level care required. Pt currently unable to participate in EOB sitting due to severe pain. Pt restricted to long sitting supine in bed with HOB elevated.     Follow Up Recommendations  SNF;Supervision/Assistance - 24 hour    Equipment Recommendations   (TBA)    Recommendations for Other Services       Precautions / Restrictions Precautions Precautions: Fall      Mobility Bed Mobility Overal bed mobility: Needs Assistance Bed Mobility: Rolling Rolling: Supervision         General bed mobility comments: incr time and effort. Pt reports "i have to do for myself. "  Transfers                 General transfer comment: bed level due to pain     Balance                                            ADL Overall ADL's : Needs assistance/impaired Eating/Feeding: NPO   Grooming: Wash/dry face;Independent;Sitting               Lower Body Dressing: Bed level;Supervision/safety Lower Body Dressing Details (indicate cue type and reason): pt able to pull up sock in bed with hOB elevated by crossing slightly at ankles. pt reports discomfort at back adn R shoulder. Pt would have incr (A) required in sitting or sit <>Stand due to pain               General ADL Comments: pt bed level evaluation due to pain and pending  MRI. Pt demonstrates ability to complete pressure relief with incr time and effort. Pt positioned on L side in long sitting at end of session. Pt able to use bil LE to boost buttock off bed surface with only arms      Vision     Perception     Praxis      Pertinent Vitals/Pain Pain Assessment: 0-10 Pain Score: 10-Worst pain ever Pain Location: back  Pain Descriptors / Indicators: Spasm Pain Intervention(s): Premedicated before session;Repositioned;Monitored during session     Hand Dominance Right   Extremity/Trunk Assessment Upper Extremity Assessment Upper Extremity Assessment: RUE deficits/detail RUE Deficits / Details: numbness tingling decr grasp since nov 2016- reports numbness from DIP to shoulder. Pt demonstrates WFL AROM during eval. pt verbalizes some discomfort with movement   Lower Extremity Assessment Lower Extremity Assessment: Defer to PT evaluation   Cervical / Trunk Assessment Cervical / Trunk Assessment: Normal   Communication Communication Communication: No difficulties   Cognition Arousal/Alertness: Awake/alert Behavior During Therapy: WFL for tasks assessed/performed Overall Cognitive Status: Within Functional Limits for tasks assessed                     General Comments  Exercises       Shoulder Instructions      Home Living Family/patient expects to be discharged to:: Private residence Living Arrangements: Spouse/significant other Available Help at Discharge: Family;Available 24 hours/day Type of Home: House Home Access: Stairs to enter [employer] of Steps: 4 Entrance Stairs-Rails: Right;Left;Can reach both Home Layout: One level     Bathroom Shower/Tub: Chief Strategy Officer: Standard     Home Equipment: Grab bars - tub/shower;Walker - 2 wheels   Additional Comments: currently MD restricited from driving      Prior Functioning/Environment Level of Independence: Independent              OT Diagnosis: Generalized weakness;Acute pain   OT Problem List: Decreased strength;Decreased activity tolerance;Impaired balance (sitting and/or standing);Decreased safety awareness;Decreased knowledge of use of DME or AE;Decreased knowledge of precautions;Cardiopulmonary status limiting activity;Pain   OT Treatment/Interventions: Self-care/ADL training;Therapeutic exercise;DME and/or AE instruction;Energy conservation;Therapeutic activities;Patient/family education;Balance training    OT Goals(Current goals can be found in the care plan section) Acute Rehab OT Goals Patient Stated Goal: to be able to do for myself OT Goal Formulation: With patient Time For Goal Achievement: 06/21/15 Potential to Achieve Goals: Good  OT Frequency: Min 2X/week   Barriers to D/C:            Co-evaluation              End of Session Nurse Communication: Mobility status;Precautions  Activity Tolerance: Patient limited by pain Patient left: in bed;with call bell/phone within reach;with nursing/sitter in room (MD arriving at end of session)   Time: 9872-1587 OT Time Calculation (min): 26 min Charges:  OT General Charges $OT Visit: 1 Procedure OT Evaluation $OT Eval High Complexity: 1 Procedure OT Treatments $Therapeutic Activity: 8-22 mins G-Codes:    Katrina Peck 2015-06-30, 8:23 AM  Katrina Peck   OTR/L Pager: [phone] Office: [phone] .

## 2015-06-07 NOTE — Progress Notes (Signed)
Pharmacy Antibiotic Note  Katrina Peck is a 70 y.o. female with MSSA and klebsiella bacteremia and possible vertebral infection (catheter related infection).  Pharmacy has been consulted for Unasyn dosing. -WBC= 16.8, afeb, CrCl ~ 40  Plan: -Unasyn 3gm IV q6h -Will follow renal function, cultures and clinical progress   Height: 5\' 2"  (157.5 cm) Weight: 99 lb 10.4 oz (45.2 kg) IBW/kg (Calculated) : 50.1  Temp (24hrs), Avg:98.5 F (36.9 C), Min:98 F (36.7 C), Max:99.1 F (37.3 C)   Recent Labs Lab 06/04/15 2048 06/04/15 2257 06/05/15 0330 06/05/15 0740 06/05/15 0940 06/06/15 0424 06/07/15 0245  WBC 24.9*  --  21.4*  --   --  19.9* 16.8*  CREATININE 1.07*  --  1.09*  --   --  0.92 0.91  LATICACIDVEN  --  1.93  --  1.0 1.0  --   --     Estimated Creatinine Clearance: 42.2 mL/min (by C-G formula based on Cr of 0.91).    No Known Allergies  Antimicrobials this admission: 3/14 unasyn>> 3/12 Vanc >> 3/14 3/12 Zosyn >> 3/12 3/12 ceftriax>  3/14  Microbiology results: 3/12 blood x2- ngtd 3/11 blood x2- MSSA, klebsiella  Thank you for allowing pharmacy to be a part of this patient's care.  Harland German, Pharm D 06/07/2015 3:00 PM

## 2015-06-07 NOTE — Progress Notes (Signed)
ANTICOAGULATION CONSULT NOTE - Follow Up Consult  Pharmacy Consult for heparin bridge to warfarin Indication: atrial fibrillation and history of LV thrombus  No Known Allergies  Patient Measurements: Height: 5\' 2"  (157.5 cm) Weight: 99 lb 10.4 oz (45.2 kg) IBW/kg (Calculated) : 50.1 Heparin Dosing Weight:46  Vital Signs: Temp: 98.3 F (36.8 C) (03/14 0400) Temp Source: Oral (03/14 0400) BP: 108/81 mmHg (03/14 0400) Pulse Rate: 82 (03/14 0400)  Labs:  Recent Labs  06/05/15 0330  06/06/15 0424 06/06/15 0817 06/06/15 2005 06/07/15 0245 06/07/15 0627  HGB 10.5*  --  10.1*  --   --  10.8*  --   HCT 32.7*  --  32.0*  --   --  34.6*  --   PLT 209  --  225  --   --  266  --   LABPROT 21.2*  --   --  21.0*  --   --  26.7*  INR 1.84*  --   --  1.81*  --   --  2.50*  HEPARINUNFRC  --   < >  --  <0.10* 0.27*  --  0.46  CREATININE 1.09*  --  0.92  --   --  0.91  --   < > = values in this interval not displayed.  Estimated Creatinine Clearance: 42.2 mL/min (by C-G formula based on Cr of 0.91).  Assessment: 70 year old female with afib on chronic coumadin (INR 2.5 increased from 1.8 after 2 doses of 7.5 mg) and history of LV thrombus. Heparin until INR therapeutic--can d/c today. RN reports no s/s of bleeding. CBC stable.  PTA warfarin dose: 6mg  daily except for 4mg  on M/W/F  Goal of Therapy:  INR 2-3 Heparin level 0.3-0.7 units/ml Monitor platelets by anticoagulation protocol: Yes   Plan:  Warfarin 4 mg *1 tonight Discontinue heparin Daily CBC and INR  Sherron Monday, PharmD Clinical Pharmacy Resident Pager: [phone] 06/07/2015 7:58 AM

## 2015-06-07 NOTE — Consult Note (Signed)
Physical Medicine and Rehabilitation Consult Reason for Consult: Debilitation/sepsis/bacteremia concern for discitis Referring Physician: [facility]   HPI: MIRL FYE is a 70 y.o. right handed female with history of hypertension, systolic congestive heart failure with ejection fraction 20%, PAF on chronic Coumadin. Patient lives with her husband independent but sedentary prior to admission. One level home 3 steps to entry. Husband can assist. Presented 06/05/2015 with low and mid back pain and spasms 3 days that progressed in the thoracic spine and neck with associated right arm numbness and weakness. BNP 3619, WBC 24,900, creatinine 1.07. CT abdomen and pelvis negative. Infectious disease consulted for leukocytosis placed on broad-spectrum antibiotics. Due to her increasing back pain patient cannot lie flat for an MRI. Blood cultures gram-positive cocci in pairs/staph aureus. TEE completed showing no thrombus no vegetation ejection fraction 10%.. Arrangements made for MRI with conscious sedation. Patient remains on chronic Coumadin. Physical occupational therapy evaluation completed with recommendations of physical medicine rehabilitation consult.   Review of Systems  Constitutional: Positive for malaise/fatigue. Negative for fever and chills.  HENT: Negative for hearing loss.   Eyes: Negative for blurred vision and double vision.  Respiratory: Negative for cough.        Shortness of breath with heavy exertion  Cardiovascular: Positive for palpitations and leg swelling. Negative for chest pain.  Gastrointestinal: Positive for constipation. Negative for nausea and vomiting.  Genitourinary: Negative for dysuria, urgency and hematuria.  Musculoskeletal: Positive for myalgias, back pain, joint pain and neck pain.  Skin: Negative for rash.  Neurological: Positive for sensory change, focal weakness and weakness. Negative for seizures, loss of consciousness and headaches.  All other systems  reviewed and are negative.  Past Medical History  Diagnosis Date  . Anemia   . Hypertension   . Blood transfusion without reported diagnosis   . Chronic systolic CHF (congestive heart failure) (HCC)     a. 06/2014 Echo: EF 20%.  Marland Kitchen NICM (nonischemic cardiomyopathy) (HCC)     a. 06/2014 Echo: EF 20%, diff HK, mild MR, mildly dil LA/RA, mildly reduced RV fxn;  b. 06/2014 Cath: nl cors.  . Pericardial effusion     a. 06/2014 Large effusion, no tamponade phys.   Past Surgical History  Procedure Laterality Date  . No past surgeries    . Left and right heart catheterization with coronary angiogram N/A 07/23/2014    Procedure: LEFT AND RIGHT HEART CATHETERIZATION WITH CORONARY ANGIOGRAM;  Surgeon: Laurey Morale, MD;  Location[facility] CATH LAB;  Service: Cardiovascular;  Laterality: N/A;  . Subxyphoid pericardial window N/A 07/26/2014    Procedure: SUBXYPHOID PERICARDIAL WINDOW;  Surgeon: Delight Ovens, MD;  Location: [facility] OR;  Service: Thoracic;  Laterality: N/A;  . Tee without cardioversion N/A 07/26/2014    Procedure: TRANSESOPHAGEAL ECHOCARDIOGRAM (TEE);  Surgeon: Delight Ovens, MD;  Location: [facility] OR;  Service: Thoracic;  Laterality: N/A;  . Pleural effusion drainage N/A 07/26/2014    Procedure: DRAINAGE OF PERICARDIAL EFFUSION;  Surgeon: Delight Ovens, MD;  Location: [facility] OR;  Service: Thoracic;  Laterality: N/A;  . Cardiac catheterization N/A 02/07/2015    Procedure: Right Heart Cath;  Surgeon: Laurey Morale, MD;  Location: [facility] INVASIVE CV LAB;  Service: Cardiovascular;  Laterality: N/A;  . Tee without cardioversion N/A 06/06/2015    Procedure: TRANSESOPHAGEAL ECHOCARDIOGRAM (TEE);  Surgeon: Dolores Patty, MD;  Location: [facility] ENDOSCOPY;  Service: Cardiovascular;  Laterality: N/A;   Family History  Problem Relation Age of Onset  .  Hypertension Sister   . Diabetes Maternal Grandmother   . Cancer Mother     died young (pt was only in [employer] @ the time)  . Other Father     died in his  73's.   Social History:  reports that she has never smoked. She has never used smokeless tobacco. She reports that she does not drink alcohol or use illicit drugs. Allergies: No Known Allergies Medications Prior to Admission  Medication Sig Dispense Refill  . amiodarone (PACERONE) 100 MG tablet Take 1 tablet (100 mg total) by mouth daily. 30 tablet 5  . digoxin 62.5 MCG TABS Take 0.0625 mg by mouth daily. 30 tablet 6  . Magnesium Oxide 200 MG TABS Take 1 tablet (200 mg total) by mouth 2 (two) times daily. 90 each 3  . milrinone (PRIMACOR) 20 MG/100ML SOLN infusion Inject 11.6 mcg/min into the vein continuous. 100 mL 0  . potassium chloride SA (K-DUR,KLOR-CON) 20 MEQ tablet Take 2 tablets (40 mEq total) by mouth daily. 60 tablet 6  . spironolactone (ALDACTONE) 25 MG tablet Take 0.5 tablets (12.5 mg total) by mouth daily. 15 tablet 6  . torsemide (DEMADEX) 20 MG tablet Take 1 tablet (20 mg total) by mouth daily. 30 tablet 0  . warfarin (COUMADIN) 2 MG tablet Take as directed by [facility] (Patient taking differently: Take 4-6 mg by mouth daily at 6 PM. Takes 4mg  on mon, wed and fri  Takes 6mg  all other days) 70 tablet 1  . feeding supplement, ENSURE ENLIVE, (ENSURE ENLIVE) LIQD Take 237 mLs by mouth 2 (two) times daily between meals. 237 mL 12    Home: Home Living Family/patient expects to be discharged to:: Private residence Living Arrangements: Spouse/significant other Available Help at Discharge: Family, Available 24 hours/day Type of Home: House Home Access: Stairs to enter [employer] of Steps: 4 Entrance Stairs-Rails: Right, Left, Can reach both Home Layout: One level Bathroom Shower/Tub: Engineer, manufacturing systems: Standard Home Equipment: Grab bars - tub/shower, Environmental consultant - 2 wheels Additional Comments: currently MD restricited from driving  Functional History: Prior Function Level of Independence: Independent Functional Status:  Mobility: Bed  Mobility Overal bed mobility: Needs Assistance Bed Mobility: Rolling, Sidelying to Sit, Sit to Sidelying Rolling: Supervision Sit to sidelying: Mod assist General bed mobility comments: Near constant cues for technique of going sit to sidelying; mod assist to support trunk and help LEs onto bed; HOB in near-sitting position upon arrival, so simply turned to get EOB; used bed pad to square off hips Transfers Overall transfer level: Needs assistance Equipment used: Rolling walker (2 wheeled) Transfers: Sit to/from Stand Sit to Stand: Mod assist General transfer comment: Mod assist to power up; cues for hand placement and use of RW -- she notably avoided placing hands on RW Ambulation/Gait Ambulation/Gait assistance: Min guard Ambulation Distance (Feet):  (Very small sidesteps up to [facility]) Assistive device: Rolling walker (2 wheeled)    ADL: ADL Overall ADL's : Needs assistance/impaired Eating/Feeding: NPO Grooming: Wash/dry face, Independent, Sitting Lower Body Dressing: Bed level, Supervision/safety Lower Body Dressing Details (indicate cue type and reason): pt able to pull up sock in bed with hOB elevated by crossing slightly at ankles. pt reports discomfort at back adn R shoulder. Pt would have incr (A) required in sitting or sit <>Stand due to pain General ADL Comments: pt bed level evaluation due to pain and pending MRI. Pt demonstrates ability to complete pressure relief with incr time and effort. Pt positioned on L  side in long sitting at end of session. Pt able to use bil LE to boost buttock off bed surface with only arms   Cognition: Cognition Overall Cognitive Status: Within Functional Limits for tasks assessed Orientation Level: Oriented X4 Cognition Arousal/Alertness: Awake/alert Behavior During Therapy: WFL for tasks assessed/performed Overall Cognitive Status: Within Functional Limits for tasks assessed  Blood pressure 102/69, pulse 79, temperature 98 F (36.7 C),  temperature source Oral, resp. rate 18, height  (1.575 m), weight 45.2 kg (99 lb 10.4 oz), SpO2 97 %. Physical Exam  Vitals reviewed. Constitutional: She is oriented to person, place, and time. No distress.  Frail  HENT:  Head: Normocephalic.  Right Ear: External ear normal.  Left Ear: External ear normal.  Eyes: Conjunctivae and EOM are normal.  Neck: Normal range of motion. Neck supple. No thyromegaly present.  Cardiovascular: Normal rate and regular rhythm.   Respiratory: Effort normal and breath sounds normal. No respiratory distress.  GI: Soft. Bowel sounds are normal. She exhibits no distension.  Musculoskeletal: She exhibits tenderness (Diffusely). She exhibits no edema.  Neurological: She is alert and oriented to person, place, and time.  DTRs symmetric Motor: Right upper extremity: 3+/5 shoulder abduction, elbow flexion/extension, 2/5 hand grip Left upper extremity: 2+/5 throughout Right lower extremity hip flexion: 4+/5, ankle dorsi/plantar flexion 4+/5 Lower extremity: Hip flexion 3/5, ankle dorsi/flexion 4+/5 Sensation diminished light touch right hand and bilateral lower extremity  Skin: Skin is warm and dry. She is not diaphoretic.  Psychiatric: Her behavior is normal. Thought content normal.    Results for orders placed or performed during the hospital encounter of 06/04/15 (from the past 24 hour(s))  Heparin level (unfractionated)     Status: Abnormal   Collection Time: 06/06/15  8:05 PM  Result Value Ref Range   Heparin Unfractionated 0.27 (L) 0.30 - 0.70 IU/mL  Basic metabolic panel     Status: Abnormal   Collection Time: 06/07/15  2:45 AM  Result Value Ref Range   Sodium 138 135 - 145 mmol/L   Potassium 3.7 3.5 - 5.1 mmol/L   Chloride 101 101 - 111 mmol/L   CO2 26 22 - 32 mmol/L   Glucose, Bld 101 (H) 65 - 99 mg/dL   BUN 16 6 - 20 mg/dL   Creatinine, Ser 7.82 0.44 - 1.00 mg/dL   Calcium 8.7 (L) 8.9 - 10.3 mg/dL   GFR calc non Af Amer >60 >60 mL/min    GFR calc Af Amer >60 >60 mL/min   Anion gap 11 5 - 15  CBC     Status: Abnormal   Collection Time: 06/07/15  2:45 AM  Result Value Ref Range   WBC 16.8 (H) 4.0 - 10.5 K/uL   RBC 4.10 3.87 - 5.11 MIL/uL   Hemoglobin 10.8 (L) 12.0 - 15.0 g/dL   HCT 95.6 (L) 21.3 - 08.6 %   MCV 84.4 78.0 - 100.0 fL   MCH 26.3 26.0 - 34.0 pg   MCHC 31.2 30.0 - 36.0 g/dL   RDW 57.8 46.9 - 62.9 %   Platelets 266 150 - 400 K/uL  Heparin level (unfractionated)     Status: None   Collection Time: 06/07/15  6:27 AM  Result Value Ref Range   Heparin Unfractionated 0.46 0.30 - 0.70 IU/mL  Protime-INR     Status: Abnormal   Collection Time: 06/07/15  6:27 AM  Result Value Ref Range   Prothrombin Time 26.7 (H) 11.6 - 15.2 seconds   INR 2.50 (  H) 0.00 - 1.49  Glucose, capillary     Status: None   Collection Time: 06/07/15  8:09 AM  Result Value Ref Range   Glucose-Capillary 96 65 - 99 mg/dL   Comment 1 Capillary Specimen    No results found.  Assessment/Plan: Diagnosis: Debilitation/sepsis/bacteremia concern for discitis Labs and images independently reviewed.  Records reviewed and summated above.  1. Does the need for close, 24 hr/day medical supervision in concert with the patient's rehab needs make it unreasonable for this patient to be served in a less intensive setting? Yes  2. Co-Morbidities requiring supervision/potential complications: HTN (monitor and provide prns in accordance with increased physical exertion and pain), systolic congestive heart failure with ejection fraction 20% (Monitor in accordance with increased physical activity and avoid UE resistance excercises), PAF (Cont meds, monitor HR with increased activity), Blood cultures + (cont abx), leukocytosis (cont to monitor for signs and symptoms of infection, further workup if indicated), ABLA (transfuse if necessary to ensure appropriate perfusion for increased activity tolerance) 3. Due to safety, skin/wound care, disease management,  medication administration, pain management and patient education, does the patient require 24 hr/day rehab nursing? Yes 4. Does the patient require coordinated care of a physician, rehab nurse, PT (1-2 hrs/day, 5 days/week) and OT (1-2 hrs/day, 5 days/week) to address physical and functional deficits in the context of the above medical diagnosis(es)? Yes Addressing deficits in the following areas: balance, endurance, locomotion, strength, transferring, dressing, grooming, toileting and psychosocial support 5. Can the patient actively participate in an intensive therapy program of at least 3 hrs of therapy per day at least 5 days per week? Potentially 6. The potential for patient to make measurable gains while on inpatient rehab is good 7. Anticipated functional outcomes upon discharge from inpatient rehab are modified independent  with PT, modified independent with OT, n/a with SLP. 8. Estimated rehab length of stay to reach the above functional goals is: 14-17 days. 9. Does the patient have adequate social supports and living environment to accommodate these discharge functional goals? Yes 10. Anticipated D/C setting: Home 11. Anticipated post D/C treatments: HH therapy and Home excercise program 12. Overall Rehab/Functional Prognosis: good  RECOMMENDATIONS: This patient's condition is appropriate for continued rehabilitative care in the following setting: CIR once medically appropriate Patient has agreed to participate in recommended program. Yes Note that insurance prior authorization may be required for reimbursement for recommended care.  Comment: Rehab Admissions Coordinator to follow up.  Maryla Morrow, MD 06/07/2015

## 2015-06-07 NOTE — Progress Notes (Signed)
ANTICOAGULATION CONSULT NOTE - Follow Up Consult  Pharmacy Consult for heparin bridge to warfarin Indication: atrial fibrillation and history of LV thrombus  No Known Allergies  Patient Measurements: Height: 5\' 2"  (157.5 cm) Weight: 99 lb 3.3 oz (45 kg) IBW/kg (Calculated) : 50.1 Heparin Dosing Weight:46  Vital Signs: Temp: 99.1 F (37.3 C) (03/13 2332) Temp Source: Oral (03/13 2332) BP: 108/81 mmHg (03/14 0400) Pulse Rate: 82 (03/14 0400)  Labs:  Recent Labs  06/04/15 2048 06/05/15 0330  06/06/15 0424 06/06/15 0817 06/06/15 2005 06/07/15 0245  HGB 11.4* 10.5*  --  10.1*  --   --  10.8*  HCT 35.5* 32.7*  --  32.0*  --   --  34.6*  PLT 235 209  --  225  --   --  266  LABPROT 20.1* 21.2*  --   --  21.0*  --   --   INR 1.72* 1.84*  --   --  1.81*  --   --   HEPARINUNFRC  --   --   < >  --  <0.10* 0.27* <0.10*  CREATININE 1.07* 1.09*  --  0.92  --   --  0.91  < > = values in this interval not displayed.  Estimated Creatinine Clearance: 42 mL/min (by C-G formula based on Cr of 0.91).  Assessment: 70 year old female with afib on chronic coumadin (INR 1.8) and history of LV thrombus. Currently on IV heparin until INR therapeutic. Heparin level this a.m. down to undetectable even though rate increased last night. RN reports no issue with gtt. Awaiting a.m. INR. RN reports no s/s of bleeding. CBC stable.  Goal of Therapy:  INR 2-3 Heparin level 0.3-0.7 units/ml Monitor platelets by anticoagulation protocol: Yes   Plan:  Increase heparin to 1450 units/hr Check HL in 6 hours  F/u INR  Christoper Fabian, PharmD, BCPS Clinical pharmacist, pager [phone] 06/07/2015 5:04 AM

## 2015-06-07 NOTE — Evaluation (Signed)
Physical Therapy Evaluation Patient Details Name: Katrina Peck MRN: [id number] DOB: 28-Dec-1945 Today's Date: 06/07/2015   History of Present Illness  70 yo female admitted with severe back pain workup underway to r/o discitis and sepsis. PMH: anemia, HTN, chronic systolic CHF, NICM pericardial effusion  Clinical Impression   Pt admitted with above diagnosis. Pt currently with functional limitations due to the deficits listed below (see PT Problem List). Considering Ms. Batley was independent PTA, she does show good rehab potential, provided her pain is controlled; Will request CIR screen;    Pt will benefit from skilled PT to increase their independence and safety with mobility to allow discharge to the venue listed below.       Follow Up Recommendations CIR    Equipment Recommendations  Rolling walker with 5" wheels;3in1 (PT)    Recommendations for Other Services       Precautions / Restrictions Precautions Precautions: Fall Precaution Comments: Observing back precautions for comfort      Mobility  Bed Mobility Overal bed mobility: Needs Assistance Bed Mobility: Rolling;Sidelying to Sit;Sit to Sidelying Rolling: Supervision       Sit to sidelying: Mod assist General bed mobility comments: Near constant cues for technique of going sit to sidelying; mod assist to support trunk and help LEs onto bed; HOB in near-sitting position upon arrival, so simply turned to get EOB; used bed pad to square off hips  Transfers Overall transfer level: Needs assistance Equipment used: Rolling walker (2 wheeled) Transfers: Sit to/from Stand Sit to Stand: Mod assist         General transfer comment: Mod assist to power up; cues for hand placement and use of RW -- she notably avoided placing hands on RW  Ambulation/Gait Ambulation/Gait assistance: Min guard Ambulation Distance (Feet):  (Very small sidesteps up to [facility]) Assistive device: Rolling walker (2 wheeled)           Stairs            Wheelchair Mobility    Modified Rankin (Stroke Patients Only)       Balance                                             Pertinent Vitals/Pain Pain Assessment: Faces Pain Score: 10-Worst pain ever Faces Pain Scale: Hurts whole lot Pain Location: Back and neck pain Pain Descriptors / Indicators: Aching;Spasm (neck pain with adjustment of gown) Pain Intervention(s): Limited activity within patient's tolerance;Monitored during session;Premedicated before session;Repositioned    Home Living Family/patient expects to be discharged to:: Private residence Living Arrangements: Spouse/significant other Available Help at Discharge: Family;Available 24 hours/day Type of Home: House Home Access: Stairs to enter Entrance Stairs-Rails: Right;Left;Can reach both Entrance Stairs-Number of Steps: 4 Home Layout: One level Home Equipment: Grab bars - tub/shower;Walker - 2 wheels Additional Comments: currently MD restricited from driving    Prior Function Level of Independence: Independent               Hand Dominance   Dominant Hand: Right    Extremity/Trunk Assessment   Upper Extremity Assessment: Defer to OT evaluation RUE Deficits / Details: numbness tingling decr grasp since nov 2016- reports numbness from DIP to shoulder. Pt demonstrates WFL AROM during eval. pt verbalizes some discomfort with movement         Lower Extremity Assessment: Generalized weakness  Cervical / Trunk Assessment: Normal  Communication   Communication: No difficulties  Cognition Arousal/Alertness: Awake/alert Behavior During Therapy: WFL for tasks assessed/performed Overall Cognitive Status: Within Functional Limits for tasks assessed                      General Comments General comments (skin integrity, edema, etc.): VSS    Exercises        Assessment/Plan    PT Assessment Patient needs continued PT services  PT  Diagnosis Acute pain;Generalized weakness;Difficulty walking   PT Problem List Decreased strength;Decreased range of motion;Decreased activity tolerance;Decreased balance;Decreased mobility;Decreased knowledge of use of DME;Decreased safety awareness;Decreased knowledge of precautions;Pain  PT Treatment Interventions DME instruction;Gait training;Stair training;Functional mobility training;Therapeutic activities;Therapeutic exercise;Patient/family education   PT Goals (Current goals can be found in the Care Plan section) Acute Rehab PT Goals Patient Stated Goal: to be able to do for myself PT Goal Formulation: With patient Time For Goal Achievement: 06/21/15 Potential to Achieve Goals: Good    Frequency Min 3X/week   Barriers to discharge Decreased caregiver support Spouse cannot give physical assist    Co-evaluation               End of Session   Activity Tolerance: Patient tolerated treatment well;Patient limited by pain Patient left: in bed;with call bell/phone within reach;Other (comment) (bed in semi-chair position) Nurse Communication: Mobility status         Time: 4481-8563 PT Time Calculation (min) (ACUTE ONLY): 30 min   Charges:   PT Evaluation $PT Eval Moderate Complexity: 1 Procedure PT Treatments $Therapeutic Activity: 8-22 mins   PT G Codes:        Olen Pel 06/07/2015, 9:59 AM  Van Clines, PT  Acute Rehabilitation Services Pager [phone] Office [phone]

## 2015-06-07 NOTE — Progress Notes (Signed)
PATIENT DETAILS Name: CINDY FULLMAN Age: 70 y.o. Sex: female Date of Birth: 05/13/1945 Admit Date: 06/04/2015 Admitting Physician Lorretta Harp, MD WUJ:[email or web address], MD   Brief narrative: 70 year old female with history of chronic systolic heart failure-on milrinone infusion at home via a tunneled catheter, presented to the hospital with severe lower back pain. Found to have significant leukocytosis. No other site of infection apparent, concern for catheter-related bloodstream infection and discitis.Admitted for further evaluation and treatment  Subjective: Continues to have back pain  Assessment/Plan: Principal Problem: Sepsis due to gram positive and gram negative bacteremia:Secondary to catheter related blood stream infection and possible discitis. Tunneled catheter removed by IR on 3/13.  MRI entire spine with gen anesthesia/sedation scheduled for tomorrow. TEE 3/13 without any vegetations.r today.Continue empiric antibiotics, infectious disease consulted. See below for further details.  Active Problems: Worsening back pain: Given significant leukocytosis, bacteremia-concern for discitis. Unfortunately not able to lie flat for a MRI (has CHF). MRI with gen anesthesia/sedation scheduled for tomorrow-spoke with Dr Dorene Sorrow to proceed.  Acute on chronic systolic heart failure (EF 20% by TTE on December 2016): Hx of Nonischemic cardiomyopathy.LHC(4/16) with no significant coronary disease. Continues to have mild overload on exam- weight decreased to 99 lbs (103 pounds on admission),-1.8 L so far. Continue Lasix, milrinone and digoxin. CHF team following  History of LV thrombus: On chronic Coumadin. Pharmacy dosing.  Paroxysmal atrial fibrillation: Rate controlled with amiodarone, on chronic Coumadin.CHADS2VASC 5.  Chronic pericardial effusion: s/p window 07/26/14-cytology was negative. CT abdomen done on admission shows a large pericardial effusion.TEE 3/13 showed  moderate pericardial effusion-no tamponade  Pul HYQ:MVHQIO secondary to LV failure  Nodular density in left mid lung on chest x-ray: CT chest when more stable.  Protein-calorie malnutrition, severe:nutriton evaluation  Disposition: Remain inpatient  Antimicrobial agents  See below  Anti-infectives    Start     Dose/Rate Route Frequency Ordered Stop   06/07/15 0813  vancomycin (VANCOCIN) IVPB 750 mg/150 ml premix     750 mg 150 mL/hr over 60 Minutes Intravenous Every 24 hours 06/07/15 0813 06/07/15 1000   06/06/15 0400  vancomycin (VANCOCIN) IVPB 750 mg/150 ml premix  Status:  Discontinued     750 mg 150 mL/hr over 60 Minutes Intravenous Every 24 hours 06/05/15 0302 06/07/15 0813   06/05/15 1400  piperacillin-tazobactam (ZOSYN) IVPB 3.375 g  Status:  Discontinued     3.375 g 12.5 mL/hr over 240 Minutes Intravenous 3 times per day 06/05/15 0302 06/05/15 1202   06/05/15 1215  cefTRIAXone (ROCEPHIN) 2 g in dextrose 5 % 50 mL IVPB     2 g 100 mL/hr over 30 Minutes Intravenous Every 24 hours 06/05/15 1202     06/05/15 0315  piperacillin-tazobactam (ZOSYN) IVPB 3.375 g     3.375 g 100 mL/hr over 30 Minutes Intravenous STAT 06/05/15 0302 06/05/15 0610   06/05/15 0315  vancomycin (VANCOCIN) IVPB 1000 mg/200 mL premix     1,000 mg 200 mL/hr over 60 Minutes Intravenous NOW 06/05/15 0302 06/05/15 0533      DVT Prophylaxis: Coumadin  Code Status: Full code   Family Communication None at bedside  Procedures: None  CONSULTS:  cardiology and ID  Time spent 35 minutes-Greater than 50% of this time was spent in counseling, explanation of diagnosis, planning of further management, and coordination of care.  MEDICATIONS: Scheduled Meds: . amiodarone  100 mg Oral Daily  . cefTRIAXone (ROCEPHIN)  IV  2 g Intravenous Q24H  . digoxin  0.0625 mg Oral Daily  . feeding supplement (ENSURE ENLIVE)  237 mL Oral BID BM  . furosemide  40 mg Intravenous BID  . magnesium oxide  200 mg  Oral BID  . polyethylene glycol  17 g Oral Daily  . sodium chloride flush  3 mL Intravenous Q12H  . warfarin  4 mg Oral ONCE-1800  . Warfarin - Pharmacist Dosing Inpatient   Does not apply q1800   Continuous Infusions: . milrinone 0.25 mcg/kg/min (06/07/15 0427)   PRN Meds:.acetaminophen **OR** acetaminophen, HYDROmorphone (DILAUDID) injection, methocarbamol, morphine injection, oxyCODONE-acetaminophen, sodium chloride flush    PHYSICAL EXAM: Vital signs in last 24 hours: Filed Vitals:   06/07/15 0300 06/07/15 0400 06/07/15 0811 06/07/15 0900  BP: 113/87 108/81 101/78 102/69  Pulse: 88 82 74 79  Temp:  98.3 F (36.8 C) 98 F (36.7 C)   TempSrc:  Oral Oral   Resp:   18   Height:      Weight:  45.2 kg (99 lb 10.4 oz)    SpO2: 99% 98% 100% 97%    Weight change: 0.2 kg (7.1 oz) Filed Weights   06/04/15 1756 06/05/15 1630 06/07/15 0400  Weight: 46.72 kg (103 lb) 45 kg (99 lb 3.3 oz) 45.2 kg (99 lb 10.4 oz)   Body mass index is 18.22 kg/(m^2).   Gen Exam: Awake and alert with clear speech.   Neck: Supple, + JVD.   Chest: few bibasilar rales-but otherwise clear CVS: S1 S2 Regular Abdomen: soft, BS +, non tender, non distended.  Extremities: trace edema, lower extremities warm to touch,+chronic venous stasis changes Neurologic:gen weakness-but not focal Skin: No Rash.   Wounds: N/A.    Intake/Output from previous day:  Intake/Output Summary (Last 24 hours) at 06/07/15 1048 Last data filed at 06/07/15 0559  Gross per 24 hour  Intake 428.25 ml  Output   1800 ml  Net -1371.75 ml     LAB RESULTS: CBC  Recent Labs Lab 06/04/15 2048 06/05/15 0330 06/06/15 0424 06/07/15 0245  WBC 24.9* 21.4* 19.9* 16.8*  HGB 11.4* 10.5* 10.1* 10.8*  HCT 35.5* 32.7* 32.0* 34.6*  PLT 235 209 225 266  MCV 84.1 84.5 84.4 84.4  MCH 27.0 27.1 26.6 26.3  MCHC 32.1 32.1 31.6 31.2  RDW 15.7* 15.8* 15.6* 15.2    Chemistries   Recent Labs Lab 06/04/15 2048 06/05/15 0330  06/06/15 0424 06/07/15 0245  NA 142 139 134* 138  K 4.6 4.6 3.8 3.7  CL 103 105 101 101  CO2 20* 23 25 26   GLUCOSE 129* 133* 116* 101*  BUN 19 19 19 16   CREATININE 1.07* 1.09* 0.92 0.91  CALCIUM 9.8 9.1 8.8* 8.7*    CBG:  Recent Labs Lab 06/06/15 0825 06/07/15 0809  GLUCAP 118* 96    GFR Estimated Creatinine Clearance: 42.2 mL/min (by C-G formula based on Cr of 0.91).  Coagulation profile  Recent Labs Lab 06/04/15 2048 06/05/15 0330 06/06/15 0817 06/07/15 0627  INR 1.72* 1.84* 1.81* 2.50*    Cardiac Enzymes No results for input(s): CKMB, TROPONINI, MYOGLOBIN in the last 168 hours.  Invalid input(s): CK  Invalid input(s): POCBNP No results for input(s): DDIMER in the last 72 hours. No results for input(s): HGBA1C in the last 72 hours. No results for input(s): CHOL, HDL, LDLCALC, TRIG, CHOLHDL, LDLDIRECT in the last 72 hours. No results for input(s): TSH, T4TOTAL, T3FREE, THYROIDAB in the last 72 hours.  Invalid input(s):  FREET3 No results for input(s): VITAMINB12, FOLATE, FERRITIN, TIBC, IRON, RETICCTPCT in the last 72 hours. No results for input(s): LIPASE, AMYLASE in the last 72 hours.  Urine Studies No results for input(s): UHGB, CRYS in the last 72 hours.  Invalid input(s): UACOL, UAPR, USPG, UPH, UTP, UGL, UKET, UBIL, UNIT, UROB, ULEU, UEPI, UWBC, URBC, UBAC, CAST, UCOM, BILUA  MICROBIOLOGY: Recent Results (from the past 240 hour(s))  Blood culture (routine x 2)     Status: None   Collection Time: 06/04/15 10:40 PM  Result Value Ref Range Status   Specimen Description BLOOD LEFT ANTECUBITAL  Final   Special Requests BOTTLES DRAWN AEROBIC AND ANAEROBIC 5CC  Final   Culture  Setup Time   Final    GRAM POSITIVE COCCI IN CLUSTERS IN BOTH AEROBIC AND ANAEROBIC BOTTLES CRITICAL RESULT CALLED TO, READ BACK BY AND VERIFIED WITH: K MOON 06/05/15 @ 1447 M VESTAL GRAM NEGATIVE RODS ANAEROBIC BOTTLE ONLY CRITICAL RESULT CALLED TO, READ BACK BY AND VERIFIED  WITH: RN M LESSARD AT 1741 [id number] MARTINB CONFIRMED BY VINCE W    Culture   Final    STAPHYLOCOCCUS AUREUS KLEBSIELLA SPECIES SUSCEPTIBILITIES PERFORMED ON PREVIOUS CULTURE WITHIN THE LAST 5 DAYS.    Report Status 06/07/2015 FINAL  Final  Blood culture (routine x 2)     Status: None   Collection Time: 06/04/15 10:45 PM  Result Value Ref Range Status   Specimen Description BLOOD LEFT HAND  Final   Special Requests BOTTLES DRAWN AEROBIC AND ANAEROBIC 5CC  Final   Culture  Setup Time   Final    GRAM POSITIVE COCCI IN CLUSTERS IN BOTH AEROBIC AND ANAEROBIC BOTTLES CRITICAL RESULT CALLED TO, READ BACK BY AND VERIFIED WITH: K MOON 06/05/15 @ 1447 M VESTAL GRAM NEGATIVE RODS ANAEROBIC BOTTLE ONLY CRITICAL RESULT CALLED TO, READ BACK BY AND VERIFIED WITH: RN Tilden Fossa AT 1741 [id number] MARTINB CONFIRMED BY VINCE W    Culture STAPHYLOCOCCUS AUREUS KLEBSIELLA SPECIES   Final   Report Status 06/07/2015 FINAL  Final   Organism ID, Bacteria STAPHYLOCOCCUS AUREUS  Final   Organism ID, Bacteria KLEBSIELLA SPECIES  Final      Susceptibility   Staphylococcus aureus - MIC*    CIPROFLOXACIN <=0.5 SENSITIVE Sensitive     ERYTHROMYCIN <=0.25 SENSITIVE Sensitive     GENTAMICIN <=0.5 SENSITIVE Sensitive     OXACILLIN <=0.25 SENSITIVE Sensitive     TETRACYCLINE <=1 SENSITIVE Sensitive     VANCOMYCIN <=0.5 SENSITIVE Sensitive     TRIMETH/SULFA <=10 SENSITIVE Sensitive     CLINDAMYCIN <=0.25 SENSITIVE Sensitive     RIFAMPIN <=0.5 SENSITIVE Sensitive     Inducible Clindamycin NEGATIVE Sensitive     * STAPHYLOCOCCUS AUREUS   Klebsiella species - MIC*    AMPICILLIN >=32 RESISTANT Resistant     CEFAZOLIN >=64 RESISTANT Resistant     CEFEPIME <=1 SENSITIVE Sensitive     CEFTAZIDIME <=1 SENSITIVE Sensitive     CEFTRIAXONE <=1 SENSITIVE Sensitive     CIPROFLOXACIN <=0.25 SENSITIVE Sensitive     GENTAMICIN <=1 SENSITIVE Sensitive     IMIPENEM <=0.25 SENSITIVE Sensitive     TRIMETH/SULFA <=20  SENSITIVE Sensitive     AMPICILLIN/SULBACTAM 4 SENSITIVE Sensitive     PIP/TAZO <=4 SENSITIVE Sensitive     * KLEBSIELLA SPECIES  MRSA PCR Screening     Status: None   Collection Time: 06/05/15  5:51 PM  Result Value Ref Range Status   MRSA by PCR NEGATIVE NEGATIVE Final  Comment:        The GeneXpert MRSA Assay (FDA approved for NASAL specimens only), is one component of a comprehensive MRSA colonization surveillance program. It is not intended to diagnose MRSA infection nor to guide or monitor treatment for MRSA infections.   Culture, blood (routine x 2)     Status: None (Preliminary result)   Collection Time: 06/05/15  6:06 PM  Result Value Ref Range Status   Specimen Description BLOOD LEFT ANTECUBITAL  Final   Special Requests BOTTLES DRAWN AEROBIC AND ANAEROBIC 5CC  Final   Culture NO GROWTH < 24 HOURS  Final   Report Status PENDING  Incomplete  Culture, blood (routine x 2)     Status: None (Preliminary result)   Collection Time: 06/05/15  6:08 PM  Result Value Ref Range Status   Specimen Description BLOOD LEFT HAND  Final   Special Requests BOTTLES DRAWN AEROBIC AND ANAEROBIC 5CC  Final   Culture NO GROWTH < 24 HOURS  Final   Report Status PENDING  Incomplete    RADIOLOGY STUDIES/RESULTS: Ct Abdomen Pelvis W Contrast  06/05/2015  CLINICAL DATA:  Back spasms.  Leukocytosis. EXAM: CT ABDOMEN AND PELVIS WITH CONTRAST TECHNIQUE: Multidetector CT imaging of the abdomen and pelvis was performed using the standard protocol following bolus administration of intravenous contrast. CONTRAST:  75mL OMNIPAQUE IOHEXOL 300 MG/ML  SOLN COMPARISON:  None. FINDINGS: Lower chest and abdominal wall: Chronic cardiomegaly. Remote subendocardial left ventricular infarct, greatest along the anterior wall. Chronic large pericardial effusion when compared to previous CT. Chronic small right pleural effusion with atelectasis or scar. Hepatobiliary: Liver enhancement pattern suggests passive  congestion. No focal lesion.Cholelithiasis without evidence of acute cholecystitis. No duct enlargement Pancreas: Unremarkable. Spleen: Unremarkable. Adrenals/Urinary Tract: Negative adrenals. Bilateral renal cortical thinning. Small nonobstructive bilateral renal calculi. Renal sinus cysts. Chronic hemorrhagic or proteinaceous cyst in the upper pole left kidney measuring 13 mm, also seen on April 2016 chest CT. Unremarkable decompressed bladder. Reproductive:Multiple uterine fibroids measuring up to 23 mm. Stomach/Bowel: No obstruction. Colonic diverticulosis without active inflammation. No evidence of appendicitis. Vascular/Lymphatic: No acute vascular abnormality. No mass or adenopathy. Peritoneal: Small ascites Musculoskeletal: No acute abnormalities. IMPRESSION: 1. No acute finding. 2. Remote left ventricular infarct with chronic cardiomegaly. Chronic large pericardial effusion and small right pleural effusion. 3. Passive congestion of the liver. 4. Cholelithiasis and nephrolithiasis. 5. Colonic diverticulosis. Electronically Signed   By: Marnee Spring M.D.   On: 06/05/2015 01:33   Dg Chest Port 1 View  06/04/2015  CLINICAL DATA:  Shortness of breath EXAM: PORTABLE CHEST 1 VIEW COMPARISON:  03/22/2015 chest radiograph. FINDINGS: Right internal jugular central venous catheter terminates at the cavoatrial junction. Stable cardiomediastinal silhouette with moderate cardiomegaly. No pneumothorax. Small bilateral pleural effusions. Mild pulmonary edema. Mild bibasilar atelectasis. There is a new subcentimeter nodular density in the peripheral left mid lung. IMPRESSION: 1. Moderate cardiomegaly and mild pulmonary edema, in keeping with mild congestive heart failure. 2. Small bilateral pleural effusions and mild bibasilar atelectasis. 3. New subcentimeter nodular density in the peripheral left mid lung, cannot exclude a pulmonary nodule. Recommend correlation with chest CT on a short term outpatient basis.  Electronically Signed   By: Delbert Phenix M.D.   On: 06/04/2015 20:44    Jeoffrey Massed, MD  [facility] Pager:[phone]  If 7PM-7AM, please contact night-coverage [email or web address] Password [email or web address] 06/07/2015, 10:48 AM   LOS: 2 days

## 2015-06-07 NOTE — Progress Notes (Signed)
Patient ID: Katrina Peck, female   DOB: 08-10-1945, 70 y.o.   MRN: [id number]         [facility]    Date of Admission:  06/04/2015           Day 4 vancomycin        Day 3 ceftriaxone  Principal Problem:   Back pain Active Problems:   Essential hypertension   Pericardial effusion   Chronic systolic heart failure (HCC)   Paroxysmal atrial fibrillation (HCC)   Protein-calorie malnutrition, severe (HCC)   Long term (current) use of anticoagulants [Z79.01]   Sepsis (HCC)   Back spasm   Neck pain   Bacteremia   Malnutrition of moderate degree   Acute combined systolic and diastolic congestive heart failure (HCC)   . amiodarone  100 mg Oral Daily  . cefTRIAXone (ROCEPHIN)  IV  2 g Intravenous Q24H  . digoxin  0.0625 mg Oral Daily  . feeding supplement (ENSURE ENLIVE)  237 mL Oral BID BM  . furosemide  40 mg Intravenous BID  . magnesium oxide  200 mg Oral BID  . polyethylene glycol  17 g Oral Daily  . sodium chloride flush  3 mL Intravenous Q12H  . warfarin  4 mg Oral ONCE-1800  . Warfarin - Pharmacist Dosing Inpatient   Does not apply q1800    SUBJECTIVE: She continues to have "unbearable" low back and left neck pain. She states that she does not trust any of the medications she is being given because they all seem to have side effects.  Review of Systems: Review of Systems  Constitutional: Positive for malaise/fatigue. Negative for fever, chills, weight loss and diaphoresis.  HENT: Negative for sore throat.   Respiratory: Positive for shortness of breath. Negative for cough and sputum production.   Cardiovascular: Negative for chest pain.  Gastrointestinal: Negative for nausea, vomiting and diarrhea.  Genitourinary: Negative for dysuria.  Musculoskeletal: Positive for back pain and neck pain. Negative for myalgias and joint pain.  Skin: Negative for rash.    Past Medical History  Diagnosis Date  . Anemia   . Hypertension   . Blood  transfusion without reported diagnosis   . Chronic systolic CHF (congestive heart failure) (HCC)     a. 06/2014 Echo: EF 20%.  Marland Kitchen NICM (nonischemic cardiomyopathy) (HCC)     a. 06/2014 Echo: EF 20%, diff HK, mild MR, mildly dil LA/RA, mildly reduced RV fxn;  b. 06/2014 Cath: nl cors.  . Pericardial effusion     a. 06/2014 Large effusion, no tamponade phys.    Social History  Substance Use Topics  . Smoking status: Never Smoker   . Smokeless tobacco: Never Used  . Alcohol Use: No    Family History  Problem Relation Age of Onset  . Hypertension Sister   . Diabetes Maternal Grandmother   . Cancer Mother     died young (pt was only in [employer] @ the time)  . Other Father     died in his 50's.   No Known Allergies  OBJECTIVE: Filed Vitals:   06/07/15 0400 06/07/15 0811 06/07/15 0900 06/07/15 1214  BP: 108/81 101/78 102/69 100/72  Pulse: 82 74 79 81  Temp: 98.3 F (36.8 C) 98 F (36.7 C)  98.2 F (36.8 C)  TempSrc: Oral Oral  Oral  Resp:  18  18  Height:      Weight: 99 lb 10.4 oz (45.2 kg)     SpO2:  98% 100% 97% 100%   Body mass index is 18.22 kg/(m^2).  Physical Exam  Constitutional: She is oriented to person, place, and time.  She is sitting up on the side of the bed. She is sitting very still due to her pain.  Cardiovascular: Normal rate and regular rhythm.   No murmur heard. Pulmonary/Chest: Effort normal and breath sounds normal.  Abdominal: Soft. There is no tenderness.  Musculoskeletal: Normal range of motion.  No acutely inflamed joints.  Neurological: She is alert and oriented to person, place, and time.  Skin: No rash noted.  Psychiatric:  Flat affect.    Lab Results Lab Results  Component Value Date   WBC 16.8* 06/07/2015   HGB 10.8* 06/07/2015   HCT 34.6* 06/07/2015   MCV 84.4 06/07/2015   PLT 266 06/07/2015    Lab Results  Component Value Date   CREATININE 0.91 06/07/2015   BUN 16 06/07/2015   NA 138 06/07/2015   K 3.7 06/07/2015   CL  101 06/07/2015   CO2 26 06/07/2015    Lab Results  Component Value Date   ALT 25 06/05/2015   AST 21 06/05/2015   ALKPHOS 130* 06/05/2015   BILITOT 2.4* 06/05/2015     Microbiology: Recent Results (from the past 240 hour(s))  Blood culture (routine x 2)     Status: None   Collection Time: 06/04/15 10:40 PM  Result Value Ref Range Status   Specimen Description BLOOD LEFT ANTECUBITAL  Final   Special Requests BOTTLES DRAWN AEROBIC AND ANAEROBIC 5CC  Final   Culture  Setup Time   Final    GRAM POSITIVE COCCI IN CLUSTERS IN BOTH AEROBIC AND ANAEROBIC BOTTLES CRITICAL RESULT CALLED TO, READ BACK BY AND VERIFIED WITH: K MOON 06/05/15 @ 1447 M VESTAL GRAM NEGATIVE RODS ANAEROBIC BOTTLE ONLY CRITICAL RESULT CALLED TO, READ BACK BY AND VERIFIED WITH: RN M LESSARD AT [id number] MARTINB CONFIRMED BY VINCE W    Culture   Final    STAPHYLOCOCCUS AUREUS KLEBSIELLA SPECIES SUSCEPTIBILITIES PERFORMED ON PREVIOUS CULTURE WITHIN THE LAST 5 DAYS.    Report Status 06/07/2015 FINAL  Final  Blood culture (routine x 2)     Status: None   Collection Time: 06/04/15 10:45 PM  Result Value Ref Range Status   Specimen Description BLOOD LEFT HAND  Final   Special Requests BOTTLES DRAWN AEROBIC AND ANAEROBIC 5CC  Final   Culture  Setup Time   Final    GRAM POSITIVE COCCI IN CLUSTERS IN BOTH AEROBIC AND ANAEROBIC BOTTLES CRITICAL RESULT CALLED TO, READ BACK BY AND VERIFIED WITH: K MOON 06/05/15 @ 1447 M VESTAL GRAM NEGATIVE RODS ANAEROBIC BOTTLE ONLY CRITICAL RESULT CALLED TO, READ BACK BY AND VERIFIED WITH: RN Tilden Fossa AT [phone] MARTINB CONFIRMED BY VINCE W    Culture STAPHYLOCOCCUS AUREUS KLEBSIELLA SPECIES   Final   Report Status 06/07/2015 FINAL  Final   Organism ID, Bacteria STAPHYLOCOCCUS AUREUS  Final   Organism ID, Bacteria KLEBSIELLA SPECIES  Final      Susceptibility   Staphylococcus aureus - MIC*    CIPROFLOXACIN <=0.5 SENSITIVE Sensitive     ERYTHROMYCIN <=0.25 SENSITIVE  Sensitive     GENTAMICIN <=0.5 SENSITIVE Sensitive     OXACILLIN <=0.25 SENSITIVE Sensitive     TETRACYCLINE <=1 SENSITIVE Sensitive     VANCOMYCIN <=0.5 SENSITIVE Sensitive     TRIMETH/SULFA <=10 SENSITIVE Sensitive     CLINDAMYCIN <=0.25 SENSITIVE Sensitive     RIFAMPIN <=0.5 SENSITIVE  Sensitive     Inducible Clindamycin NEGATIVE Sensitive     * STAPHYLOCOCCUS AUREUS   Klebsiella species - MIC*    AMPICILLIN >=32 RESISTANT Resistant     CEFAZOLIN >=64 RESISTANT Resistant     CEFEPIME <=1 SENSITIVE Sensitive     CEFTAZIDIME <=1 SENSITIVE Sensitive     CEFTRIAXONE <=1 SENSITIVE Sensitive     CIPROFLOXACIN <=0.25 SENSITIVE Sensitive     GENTAMICIN <=1 SENSITIVE Sensitive     IMIPENEM <=0.25 SENSITIVE Sensitive     TRIMETH/SULFA <=20 SENSITIVE Sensitive     AMPICILLIN/SULBACTAM 4 SENSITIVE Sensitive     PIP/TAZO <=4 SENSITIVE Sensitive     * KLEBSIELLA SPECIES  MRSA PCR Screening     Status: None   Collection Time: 06/05/15  5:51 PM  Result Value Ref Range Status   MRSA by PCR NEGATIVE NEGATIVE Final    Comment:        The GeneXpert MRSA Assay (FDA approved for NASAL specimens only), is one component of a comprehensive MRSA colonization surveillance program. It is not intended to diagnose MRSA infection nor to guide or monitor treatment for MRSA infections.   Culture, blood (routine x 2)     Status: None (Preliminary result)   Collection Time: 06/05/15  6:06 PM  Result Value Ref Range Status   Specimen Description BLOOD LEFT ANTECUBITAL  Final   Special Requests BOTTLES DRAWN AEROBIC AND ANAEROBIC 5CC  Final   Culture NO GROWTH 2 DAYS  Final   Report Status PENDING  Incomplete  Culture, blood (routine x 2)     Status: None (Preliminary result)   Collection Time: 06/05/15  6:08 PM  Result Value Ref Range Status   Specimen Description BLOOD LEFT HAND  Final   Special Requests BOTTLES DRAWN AEROBIC AND ANAEROBIC 5CC  Final   Culture NO GROWTH 2 DAYS  Final   Report  Status PENDING  Incomplete     ASSESSMENT: She has polymicrobial bacteremia with MSSA and Klebsiella. I suspect that her previous central line was the source. She has no evidence of endocarditis by TEE. However, I'm still very worried about vertebral infection and agree with MRI of her cervical, thoracic and lumbar spine with sedation.  PLAN: 1. Consolidate antibiotic to ampicillin sulbactam 2. Agree with MRI  Cliffton Asters, MD [facility] [phone] pager   [phone] cell 06/07/2015, 1:29 PM

## 2015-06-07 NOTE — Progress Notes (Signed)
Pharmacist Heart Failure Core Measure Documentation  Assessment: Katrina Peck has an EF documented as 20% on 03/23/15 by Echo.  Rationale: Heart failure patients with left ventricular systolic dysfunction (LVSD) and an EF < 40% should be prescribed an angiotensin converting enzyme inhibitor (ACEI) or angiotensin receptor blocker (ARB) at discharge unless a contraindication is documented in the medical record.  This patient is not currently on an ACEI or ARB for HF.  This note is being placed in the record in order to provide documentation that a contraindication to the use of these agents is present for this encounter.  ACE Inhibitor or Angiotensin Receptor Blocker is contraindicated (specify all that apply)  []   ACEI allergy AND ARB allergy []   Angioedema []   Moderate or severe aortic stenosis []   Hyperkalemia [x]   Hypotension; and new start of Milrinone []   Renal artery stenosis []   Worsening renal function, preexisting renal disease or dysfunction  Harland German, Pharm D 06/07/2015 11:32 AM

## 2015-06-07 NOTE — Progress Notes (Signed)
Patient ID: Katrina Peck, female   DOB: 09/07/1945, 70 y.o.   MRN: [id number]     SUBJECTIVE: Afebrile.  WBCs trending down. Awaiting vanc this morning.  States nurse yesterday said "you don't have to take it if you don't want it" so she refused.  Still very uncomfortable and having pain in her pack.   GPCs in blood.   Weight stable. Creatinine stable.  INR 2.5   Scheduled Meds: . amiodarone  100 mg Oral Daily  . cefTRIAXone (ROCEPHIN)  IV  2 g Intravenous Q24H  . digoxin  0.0625 mg Oral Daily  . feeding supplement (ENSURE ENLIVE)  237 mL Oral BID BM  . furosemide  40 mg Intravenous BID  . magnesium oxide  200 mg Oral BID  . polyethylene glycol  17 g Oral Daily  . potassium chloride  20 mEq Oral Daily  . sodium chloride flush  3 mL Intravenous Q12H  . vancomycin  750 mg Intravenous Q24H  . Warfarin - Pharmacist Dosing Inpatient   Does not apply q1800   Continuous Infusions: . milrinone 0.25 mcg/kg/min (06/07/15 0427)   PRN Meds:.acetaminophen **OR** acetaminophen, HYDROmorphone (DILAUDID) injection, methocarbamol, morphine injection, oxyCODONE-acetaminophen, sodium chloride flush    Filed Vitals:   06/07/15 0100 06/07/15 0200 06/07/15 0300 06/07/15 0400  BP: 126/95 116/87 113/87 108/81  Pulse: 90 87 88 82  Temp:    98.3 F (36.8 C)  TempSrc:    Oral  Resp:      Height:      Weight:    99 lb 10.4 oz (45.2 kg)  SpO2: 100% 100% 99% 98%    Intake/Output Summary (Last 24 hours) at 06/07/15 0757 Last data filed at 06/07/15 0559  Gross per 24 hour  Intake 768.75 ml  Output   1800 ml  Net -1031.25 ml    LABS: Basic Metabolic Panel:  Recent Labs  40/98/11 0424 06/07/15 0245  NA 134* 138  K 3.8 3.7  CL 101 101  CO2 25 26  GLUCOSE 116* 101*  BUN 19 16  CREATININE 0.92 0.91  CALCIUM 8.8* 8.7*   Liver Function Tests:  Recent Labs  06/04/15 2048 06/05/15 0330  AST 21 21  ALT 28 25  ALKPHOS 146* 130*  BILITOT 2.7* 2.4*  PROT 7.9 6.7  ALBUMIN 3.2* 2.9*     No results for input(s): LIPASE, AMYLASE in the last 72 hours. CBC:  Recent Labs  06/06/15 0424 06/07/15 0245  WBC 19.9* 16.8*  HGB 10.1* 10.8*  HCT 32.0* 34.6*  MCV 84.4 84.4  PLT 225 266   Cardiac Enzymes: No results for input(s): CKTOTAL, CKMB, CKMBINDEX, TROPONINI in the last 72 hours. BNP: Invalid input(s): POCBNP D-Dimer: No results for input(s): DDIMER in the last 72 hours. Hemoglobin A1C: No results for input(s): HGBA1C in the last 72 hours. Fasting Lipid Panel: No results for input(s): CHOL, HDL, LDLCALC, TRIG, CHOLHDL, LDLDIRECT in the last 72 hours. Thyroid Function Tests: No results for input(s): TSH, T4TOTAL, T3FREE, THYROIDAB in the last 72 hours.  Invalid input(s): FREET3 Anemia Panel: No results for input(s): VITAMINB12, FOLATE, FERRITIN, TIBC, IRON, RETICCTPCT in the last 72 hours.  RADIOLOGY: Ct Abdomen Pelvis W Contrast  06/05/2015  CLINICAL DATA:  Back spasms.  Leukocytosis. EXAM: CT ABDOMEN AND PELVIS WITH CONTRAST TECHNIQUE: Multidetector CT imaging of the abdomen and pelvis was performed using the standard protocol following bolus administration of intravenous contrast. CONTRAST:  75mL OMNIPAQUE IOHEXOL 300 MG/ML  SOLN COMPARISON:  None. FINDINGS: Lower  chest and abdominal wall: Chronic cardiomegaly. Remote subendocardial left ventricular infarct, greatest along the anterior wall. Chronic large pericardial effusion when compared to previous CT. Chronic small right pleural effusion with atelectasis or scar. Hepatobiliary: Liver enhancement pattern suggests passive congestion. No focal lesion.Cholelithiasis without evidence of acute cholecystitis. No duct enlargement Pancreas: Unremarkable. Spleen: Unremarkable. Adrenals/Urinary Tract: Negative adrenals. Bilateral renal cortical thinning. Small nonobstructive bilateral renal calculi. Renal sinus cysts. Chronic hemorrhagic or proteinaceous cyst in the upper pole left kidney measuring 13 mm, also seen on April  2016 chest CT. Unremarkable decompressed bladder. Reproductive:Multiple uterine fibroids measuring up to 23 mm. Stomach/Bowel: No obstruction. Colonic diverticulosis without active inflammation. No evidence of appendicitis. Vascular/Lymphatic: No acute vascular abnormality. No mass or adenopathy. Peritoneal: Small ascites Musculoskeletal: No acute abnormalities. IMPRESSION: 1. No acute finding. 2. Remote left ventricular infarct with chronic cardiomegaly. Chronic large pericardial effusion and small right pleural effusion. 3. Passive congestion of the liver. 4. Cholelithiasis and nephrolithiasis. 5. Colonic diverticulosis. Electronically Signed   By: Marnee Spring M.D.   On: 06/05/2015 01:33   Dg Chest Port 1 View  06/04/2015  CLINICAL DATA:  Shortness of breath EXAM: PORTABLE CHEST 1 VIEW COMPARISON:  03/22/2015 chest radiograph. FINDINGS: Right internal jugular central venous catheter terminates at the cavoatrial junction. Stable cardiomediastinal silhouette with moderate cardiomegaly. No pneumothorax. Small bilateral pleural effusions. Mild pulmonary edema. Mild bibasilar atelectasis. There is a new subcentimeter nodular density in the peripheral left mid lung. IMPRESSION: 1. Moderate cardiomegaly and mild pulmonary edema, in keeping with mild congestive heart failure. 2. Small bilateral pleural effusions and mild bibasilar atelectasis. 3. New subcentimeter nodular density in the peripheral left mid lung, cannot exclude a pulmonary nodule. Recommend correlation with chest CT on a short term outpatient basis. Electronically Signed   By: Delbert Phenix M.D.   On: 06/04/2015 20:44    PHYSICAL EXAM General: Looks very uncomfortable. Constantly trying to reposition for comfort.  Neck: JVP difficult but appears ~8-9 cm, no thyromegaly or thyroid nodule.  Lungs: Decreased breath sounds at bases bilaterally with mild crackles CV: Lateral PMI.  Heart regular S1/S2, no S3/S4, no murmur.  No peripheral edema.     Abdomen: Soft, NT, ND, no HSM. No bruits or masses. +BS  Neurologic: Alert and oriented x 3.  Psych: Normal affect. Extremities: No clubbing or cyanosis.   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  WBCs high, concern for tunneled catheter infection with possible spread cause discitis/back pain.  GPCs in blood.   1. ID: GPC bacteremia.  ID following.  Remains afebrile on vancomycin and ceftriaxone.  Concern that back pain could represent discitis.  - Tunneled cath removed yesterday.  Continue milrinone through peripheral IV for now.  - Continue abx.  - TEE negative for vegetation or endocarditis.  - Planning MRI of back to look for discitis, will have to be sedated. Currently NPO for this. Will take off if unable to be placed on schedule this am.  2. Acute on chronic systolic CHF: Last echo with EF 20%, moderate pericardial effusion.  She is milrinone dependent. Some volume overload on exam with dyspnea.  - Will need to continue milrinone => via PIV or CVL after tunneled catheter is out.  - Continue Lasix 40 mg IV bid for today with mild volume overload.  Will likely transition to po in  am.  - Continue digoxin.  3. LV mural thrombus: INR subtherapeutic, now on heparin/coumadin.   - INR therapeutic today. Pharmacy dosing.   Graciella Freer PA-C 06/07/2015 7:57 AM  Advanced Heart Failure Team Pager [phone] (M-F; 7a - 4p)  Please contact [facility] for night-coverage after hours (4p -7a ) and weekends on [email or web address]  Patient seen with PA, agree with the above note.  Back feeling better but still painful.  Breathing better with diuresis.  WBCs coming down, afebrile.  Tunneled catheter out, no vegetation on TEE.   - Will need MRI of back for discitis.  Will need sedation, sounds like this  will be tomorrow.   - Continue antibiotics per ID.    Continue milrinone and IV Lasix today.  Reasonable diuresis yesterday.  Suspect one more day IV then back to po.  Will need central access again prior to discharge for home milrinone.  Likely tunneled catheter on other side.  Will need to discuss line care with home care nurses.    Can stop heparin now with therapeutic INR.   [name] 06/07/2015 8:48 AM

## 2015-06-08 ENCOUNTER — Inpatient Hospital Stay (HOSPITAL_COMMUNITY): Payer: Medicare Other | Admitting: Anesthesiology

## 2015-06-08 ENCOUNTER — Encounter (HOSPITAL_COMMUNITY): Payer: Self-pay | Admitting: Anesthesiology

## 2015-06-08 ENCOUNTER — Inpatient Hospital Stay (HOSPITAL_COMMUNITY): Payer: Medicare Other

## 2015-06-08 ENCOUNTER — Encounter (HOSPITAL_COMMUNITY)
Admission: EM | Disposition: A | Discharge status: Skilled Nursing Facility | Payer: Self-pay | Source: Home / Self Care | Attending: Internal Medicine

## 2015-06-08 HISTORY — PX: RADIOLOGY WITH ANESTHESIA: SHX6223

## 2015-06-08 LAB — BASIC METABOLIC PANEL
ANION GAP: 12 (ref 5–15)
BUN: 17 mg/dL (ref 6–20)
CHLORIDE: 100 mmol/L — AB (ref 101–111)
CO2: 25 mmol/L (ref 22–32)
Calcium: 8.8 mg/dL — ABNORMAL LOW (ref 8.9–10.3)
Creatinine, Ser: 0.92 mg/dL (ref 0.44–1.00)
GFR calc non Af Amer: >60 mL/min (ref >60)
GLUCOSE: 100 mg/dL — AB (ref 65–99)
Potassium: 4.6 mmol/L (ref 3.5–5.1)
Sodium: 137 mmol/L (ref 135–145)

## 2015-06-08 LAB — CBC
HCT: 35.1 % — ABNORMAL LOW (ref 36.0–46.0)
HEMOGLOBIN: 11.5 g/dL — AB (ref 12.0–15.0)
MCH: 27.7 pg (ref 26.0–34.0)
MCHC: 32.8 g/dL (ref 30.0–36.0)
MCV: 84.6 fL (ref 78.0–100.0)
Platelets: 266 10*3/uL (ref 150–400)
RBC: 4.15 MIL/uL (ref 3.87–5.11)
RDW: 15.1 % (ref 11.5–15.5)
WBC: 13 10*3/uL — ABNORMAL HIGH (ref 4.0–10.5)

## 2015-06-08 LAB — GLUCOSE, CAPILLARY: Glucose-Capillary: 97 mg/dL (ref 65–99)

## 2015-06-08 LAB — PROTIME-INR
INR: 2.27 — ABNORMAL HIGH (ref 0.00–1.49)
Prothrombin Time: 24.8 seconds — ABNORMAL HIGH (ref 11.6–15.2)

## 2015-06-08 SURGERY — RADIOLOGY WITH ANESTHESIA
Anesthesia: General

## 2015-06-08 MED ORDER — TORSEMIDE 20 MG PO TABS
20.0000 mg | ORAL_TABLET | Freq: Every day | ORAL | Status: DC
  Administered 2015-06-08: 20 mg via ORAL
  Filled 2015-06-08: qty 1

## 2015-06-08 MED ORDER — LIDOCAINE HCL (CARDIAC) 20 MG/ML IV SOLN
INTRAVENOUS | Status: DC | PRN
  Administered 2015-06-08: 60 mg via INTRAVENOUS

## 2015-06-08 MED ORDER — WARFARIN SODIUM 2 MG PO TABS
4.0000 mg | ORAL_TABLET | Freq: Once | ORAL | Status: DC
Start: 2015-06-08 — End: 2015-06-09

## 2015-06-08 MED ORDER — GADOBENATE DIMEGLUMINE 529 MG/ML IV SOLN
10.0000 mL | Freq: Once | INTRAVENOUS | Status: AC | PRN
  Administered 2015-06-08: 10 mL via INTRAVENOUS

## 2015-06-08 MED ORDER — LACTATED RINGERS IV SOLN
INTRAVENOUS | Status: DC | PRN
  Administered 2015-06-08 (×2): via INTRAVENOUS

## 2015-06-08 MED ORDER — PROPOFOL 10 MG/ML IV BOLUS
INTRAVENOUS | Status: DC | PRN
  Administered 2015-06-08: 25 mg via INTRAVENOUS

## 2015-06-08 MED ORDER — SPIRONOLACTONE 25 MG PO TABS
12.5000 mg | ORAL_TABLET | Freq: Every day | ORAL | Status: DC
  Administered 2015-06-08 – 2015-06-13 (×6): 12.5 mg via ORAL
  Filled 2015-06-08 (×6): qty 1

## 2015-06-08 MED ORDER — ETOMIDATE 2 MG/ML IV SOLN
INTRAVENOUS | Status: DC | PRN
  Administered 2015-06-08: 14 mg via INTRAVENOUS

## 2015-06-08 MED ORDER — ONDANSETRON HCL 4 MG/2ML IJ SOLN
INTRAMUSCULAR | Status: DC | PRN
  Administered 2015-06-08: 4 mg via INTRAVENOUS

## 2015-06-08 NOTE — Anesthesia Preprocedure Evaluation (Addendum)
Anesthesia Evaluation  Patient identified by MRN, date of birth, ID band Patient awake    Reviewed: Allergy & Precautions, H&P , NPO status , Patient's Chart, lab work & pertinent test results  Airway Mallampati: II  TM Distance: >3 FB Neck ROM: Full    Dental no notable dental hx. (+) Teeth Intact   Pulmonary neg pulmonary ROS,    Pulmonary exam normal        Cardiovascular hypertension, Pt. on medications +CHF  + dysrhythmias Atrial Fibrillation  Rhythm:Regular Rate:Normal  . 06/2014 Echo: EF 20%, diff HK, mild MR, mildly dil LA/RA, mildly reduced RV fxn; b. 06/2014 Cath: nl cors.   Neuro/Psych negative neurological ROS  negative psych ROS   GI/Hepatic negative GI ROS, Neg liver ROS,   Endo/Other  negative endocrine ROS  Renal/GU negative Renal ROS  negative genitourinary   Musculoskeletal   Abdominal   Peds  Hematology negative hematology ROS (+) anemia ,   Anesthesia Other Findings Anemia     Hypertension      Blood transfusion without reported diagnosis    Chronic systolic CHF   NICM (nonischemic cardiomyopathy)   EF 20%, diff HK, mild MR, mildly dil LA/RA, mildly reduced RV fxn; b. 06/2014 Cath: nl cors.   Pericardial effusion    06/2014 Large effusion, no tamponade phys.     Reproductive/Obstetrics negative OB ROS                         Anesthesia Physical  Anesthesia Plan  ASA: IV  Anesthesia Plan: General   Post-op Pain Management:    Induction: Intravenous  Airway Management Planned: Oral ETT and LMA  Additional Equipment:   Intra-op Plan:   Post-operative Plan: Extubation in OR  Informed Consent: I have reviewed the patients History and Physical, chart, labs and discussed the procedure including the risks, benefits and alternatives for the proposed anesthesia with the patient or authorized representative who has indicated his/her understanding and acceptance.    Dental Advisory Given and Dental advisory given  Plan Discussed with: CRNA and Surgeon  Anesthesia Plan Comments: (Pt understands she is at risk for postoperative ventilation and death.  Discussed case with Dr. Shirlee Latch who understands that we cannot monitor a-line pressures because of the MRI environment. Without an a-line her anesthetic course may be difficult to manage.  He is not able to utilize CT imaging for this issue (per radiology). They will attempt to keep the study as short as possible.  Understands that pt may require ventilation and ICU admission post-op but feels we must proceed.  Discussed general anesthesia, including possible nausea, instrumentation of airway, sore throat,pulmonary aspiration, etc. I asked if the were any outstanding questions, or  concerns before we proceeded. )     Anesthesia Quick Evaluation

## 2015-06-08 NOTE — Progress Notes (Signed)
Patient ID: Katrina Peck, female   DOB: 12-22-1945, 70 y.o.   MRN: [id number]         [facility]    Date of Admission:  06/04/2015           Day 5 antibiotics        Day 2 ampicillin sulbactam  Principal Problem:   Back pain Active Problems:   Essential hypertension   Pericardial effusion   Chronic systolic heart failure (HCC)   Paroxysmal atrial fibrillation (HCC)   Protein-calorie malnutrition, severe (HCC)   Long term (current) use of anticoagulants [Z79.01]   Sepsis (HCC)   Back spasm   Neck pain   Bacteremia   Malnutrition of moderate degree   Acute combined systolic and diastolic congestive heart failure (HCC)   Leukocytosis   Acute blood loss anemia   . amiodarone  100 mg Oral Daily  . ampicillin-sulbactam (UNASYN) IV  3 g Intravenous Q6H  . digoxin  0.0625 mg Oral Daily  . feeding supplement (ENSURE ENLIVE)  237 mL Oral BID BM  . magnesium oxide  200 mg Oral BID  . polyethylene glycol  17 g Oral Daily  . sodium chloride flush  3 mL Intravenous Q12H  . spironolactone  12.5 mg Oral Daily  . torsemide  20 mg Oral Daily  . warfarin  4 mg Oral ONCE-1800  . Warfarin - Pharmacist Dosing Inpatient   Does not apply q1800    SUBJECTIVE: She Is still having severe low back and neck pain.  Review of Systems: Review of Systems  Constitutional: Positive for malaise/fatigue. Negative for fever, chills, weight loss and diaphoresis.  HENT: Negative for sore throat.   Respiratory: Positive for shortness of breath. Negative for cough and sputum production.   Cardiovascular: Negative for chest pain.  Gastrointestinal: Negative for nausea, vomiting and diarrhea.  Genitourinary: Negative for dysuria.  Musculoskeletal: Positive for back pain and neck pain. Negative for myalgias and joint pain.  Skin: Negative for rash.    Past Medical History  Diagnosis Date  . Anemia   . Hypertension   . Blood transfusion without reported diagnosis   .  Chronic systolic CHF (congestive heart failure) (HCC)     a. 06/2014 Echo: EF 20%.  Marland Kitchen NICM (nonischemic cardiomyopathy) (HCC)     a. 06/2014 Echo: EF 20%, diff HK, mild MR, mildly dil LA/RA, mildly reduced RV fxn;  b. 06/2014 Cath: nl cors.  . Pericardial effusion     a. 06/2014 Large effusion, no tamponade phys.    Social History  Substance Use Topics  . Smoking status: Never Smoker   . Smokeless tobacco: Never Used  . Alcohol Use: No    Family History  Problem Relation Age of Onset  . Hypertension Sister   . Diabetes Maternal Grandmother   . Cancer Mother     died young (pt was only in [employer] @ the time)  . Other Father     died in his 44's.   No Known Allergies  OBJECTIVE: Filed Vitals:   06/08/15 0600 06/08/15 0700 06/08/15 1030 06/08/15 1136  BP: 103/67 106/72  104/82  Pulse: 70 65 69 71  Temp:  97.9 F (36.6 C)  97.9 F (36.6 C)  TempSrc:  Oral  Oral  Resp:  16  18  Height:      Weight:      SpO2: 99% 100%  98%   Body mass index is 17.9 kg/(m^2).  Physical Exam  Constitutional: She is oriented to person, place, and time.  She is resting quietly in bed.  Cardiovascular: Normal rate and regular rhythm.   No murmur heard. Pulmonary/Chest: Effort normal and breath sounds normal.  Abdominal: Soft. There is no tenderness.  Musculoskeletal: Normal range of motion.  No acutely inflamed joints.  Neurological: She is alert and oriented to person, place, and time.  Skin: No rash noted.    Lab Results Lab Results  Component Value Date   WBC 13.0* 06/08/2015   HGB 11.5* 06/08/2015   HCT 35.1* 06/08/2015   MCV 84.6 06/08/2015   PLT 266 06/08/2015    Lab Results  Component Value Date   CREATININE 0.92 06/08/2015   BUN 17 06/08/2015   NA 137 06/08/2015   K 4.6 06/08/2015   CL 100* 06/08/2015   CO2 25 06/08/2015    Lab Results  Component Value Date   ALT 25 06/05/2015   AST 21 06/05/2015   ALKPHOS 130* 06/05/2015   BILITOT 2.4* 06/05/2015      Microbiology: Recent Results (from the past 240 hour(s))  Blood culture (routine x 2)     Status: None   Collection Time: 06/04/15 10:40 PM  Result Value Ref Range Status   Specimen Description BLOOD LEFT ANTECUBITAL  Final   Special Requests BOTTLES DRAWN AEROBIC AND ANAEROBIC 5CC  Final   Culture  Setup Time   Final    GRAM POSITIVE COCCI IN CLUSTERS IN BOTH AEROBIC AND ANAEROBIC BOTTLES CRITICAL RESULT CALLED TO, READ BACK BY AND VERIFIED WITH: K MOON 06/05/15 @ 1447 M VESTAL GRAM NEGATIVE RODS ANAEROBIC BOTTLE ONLY CRITICAL RESULT CALLED TO, READ BACK BY AND VERIFIED WITH: RN M LESSARD AT 1741 [phone] MARTINB CONFIRMED BY VINCE W    Culture   Final    STAPHYLOCOCCUS AUREUS KLEBSIELLA SPECIES SUSCEPTIBILITIES PERFORMED ON PREVIOUS CULTURE WITHIN THE LAST 5 DAYS.    Report Status 06/07/2015 FINAL  Final  Blood culture (routine x 2)     Status: None   Collection Time: 06/04/15 10:45 PM  Result Value Ref Range Status   Specimen Description BLOOD LEFT HAND  Final   Special Requests BOTTLES DRAWN AEROBIC AND ANAEROBIC 5CC  Final   Culture  Setup Time   Final    GRAM POSITIVE COCCI IN CLUSTERS IN BOTH AEROBIC AND ANAEROBIC BOTTLES CRITICAL RESULT CALLED TO, READ BACK BY AND VERIFIED WITH: K MOON 06/05/15 @ 1447 M VESTAL GRAM NEGATIVE RODS ANAEROBIC BOTTLE ONLY CRITICAL RESULT CALLED TO, READ BACK BY AND VERIFIED WITH: RN Tilden Fossa AT 1741 [phone] MARTINB CONFIRMED BY VINCE W    Culture STAPHYLOCOCCUS AUREUS KLEBSIELLA SPECIES   Final   Report Status 06/07/2015 FINAL  Final   Organism ID, Bacteria STAPHYLOCOCCUS AUREUS  Final   Organism ID, Bacteria KLEBSIELLA SPECIES  Final      Susceptibility   Staphylococcus aureus - MIC*    CIPROFLOXACIN <=0.5 SENSITIVE Sensitive     ERYTHROMYCIN <=0.25 SENSITIVE Sensitive     GENTAMICIN <=0.5 SENSITIVE Sensitive     OXACILLIN <=0.25 SENSITIVE Sensitive     TETRACYCLINE <=1 SENSITIVE Sensitive     VANCOMYCIN <=0.5 SENSITIVE  Sensitive     TRIMETH/SULFA <=10 SENSITIVE Sensitive     CLINDAMYCIN <=0.25 SENSITIVE Sensitive     RIFAMPIN <=0.5 SENSITIVE Sensitive     Inducible Clindamycin NEGATIVE Sensitive     * STAPHYLOCOCCUS AUREUS   Klebsiella species - MIC*    AMPICILLIN >=32 RESISTANT Resistant     CEFAZOLIN >=  64 RESISTANT Resistant     CEFEPIME <=1 SENSITIVE Sensitive     CEFTAZIDIME <=1 SENSITIVE Sensitive     CEFTRIAXONE <=1 SENSITIVE Sensitive     CIPROFLOXACIN <=0.25 SENSITIVE Sensitive     GENTAMICIN <=1 SENSITIVE Sensitive     IMIPENEM <=0.25 SENSITIVE Sensitive     TRIMETH/SULFA <=20 SENSITIVE Sensitive     AMPICILLIN/SULBACTAM 4 SENSITIVE Sensitive     PIP/TAZO <=4 SENSITIVE Sensitive     * KLEBSIELLA SPECIES  MRSA PCR Screening     Status: None   Collection Time: 06/05/15  5:51 PM  Result Value Ref Range Status   MRSA by PCR NEGATIVE NEGATIVE Final    Comment:        The GeneXpert MRSA Assay (FDA approved for NASAL specimens only), is one component of a comprehensive MRSA colonization surveillance program. It is not intended to diagnose MRSA infection nor to guide or monitor treatment for MRSA infections.   Culture, blood (routine x 2)     Status: None (Preliminary result)   Collection Time: 06/05/15  6:06 PM  Result Value Ref Range Status   Specimen Description BLOOD LEFT ANTECUBITAL  Final   Special Requests BOTTLES DRAWN AEROBIC AND ANAEROBIC 5CC  Final   Culture NO GROWTH 2 DAYS  Final   Report Status PENDING  Incomplete  Culture, blood (routine x 2)     Status: None (Preliminary result)   Collection Time: 06/05/15  6:08 PM  Result Value Ref Range Status   Specimen Description BLOOD LEFT HAND  Final   Special Requests BOTTLES DRAWN AEROBIC AND ANAEROBIC 5CC  Final   Culture NO GROWTH 2 DAYS  Final   Report Status PENDING  Incomplete     ASSESSMENT: She has polymicrobial bacteremia with MSSA and Klebsiella. I suspect that her previous central line was the source. She has  no evidence of endocarditis by TEE. Repeat blood cultures are negative at 72 hours so she can have a new central line placed at any time. She is scheduled to have MRI of her cervical, thoracic and lumbar spine with sedation today.  PLAN: 1. Continue ampicillin sulbactam 2. Await results of MRI  Cliffton Asters, MD [facility] [phone] pager   [phone] cell 06/08/2015, 12:29 PM

## 2015-06-08 NOTE — Progress Notes (Addendum)
Patient ID: Katrina Peck, female   DOB: [birth year], 70 y.o.   MRN: [id number]     SUBJECTIVE: Afebrile.  WBCs trending down. Transitioned to Unasyn.  Still with back pain but able to get out of bed yesterday.   Cultures with MSSA and Klebsiella.  Tunneled catheter out.  Weight down 2 lbs, stable creatinine.   Scheduled Meds: . amiodarone  100 mg Oral Daily  . ampicillin-sulbactam (UNASYN) IV  3 g Intravenous Q6H  . digoxin  0.0625 mg Oral Daily  . feeding supplement (ENSURE ENLIVE)  237 mL Oral BID BM  . magnesium oxide  200 mg Oral BID  . polyethylene glycol  17 g Oral Daily  . sodium chloride flush  3 mL Intravenous Q12H  . spironolactone  12.5 mg Oral Daily  . torsemide  20 mg Oral Daily  . Warfarin - Pharmacist Dosing Inpatient   Does not apply q1800   Continuous Infusions: . milrinone 0.25 mcg/kg/min (06/07/15 0427)   PRN Meds:.acetaminophen **OR** acetaminophen, HYDROmorphone (DILAUDID) injection, methocarbamol, morphine injection, oxyCODONE-acetaminophen, sodium chloride flush    Filed Vitals:   06/08/15 0400 06/08/15 0431 06/08/15 0500 06/08/15 0600  BP: 102/70 102/70 118/73 103/67  Pulse: 70 72 72 70  Temp:  97.5 F (36.4 C)    TempSrc:  Oral    Resp:      Height:      Weight:  97 lb 14.2 oz (44.4 kg)    SpO2: 99% 100% 96% 99%    Intake/Output Summary (Last 24 hours) at 06/08/15 0733 Last data filed at 06/08/15 0507  Gross per 24 hour  Intake  818.9 ml  Output    725 ml  Net   93.9 ml    LABS: Basic Metabolic Panel:  Recent Labs  13/08/65 0245 06/08/15 0320  NA 138 137  K 3.7 4.6  CL 101 100*  CO2 26 25  GLUCOSE 101* 100*  BUN 16 17  CREATININE 0.91 0.92  CALCIUM 8.7* 8.8*   Liver Function Tests: No results for input(s): AST, ALT, ALKPHOS, BILITOT, PROT, ALBUMIN in the last 72 hours. No results for input(s): LIPASE, AMYLASE in the last 72 hours. CBC:  Recent Labs  06/07/15 0245 06/08/15 0320  WBC 16.8* 13.0*  HGB 10.8* 11.5*  HCT  34.6* 35.1*  MCV 84.4 84.6  PLT 266 266   Cardiac Enzymes: No results for input(s): CKTOTAL, CKMB, CKMBINDEX, TROPONINI in the last 72 hours. BNP: Invalid input(s): POCBNP D-Dimer: No results for input(s): DDIMER in the last 72 hours. Hemoglobin A1C: No results for input(s): HGBA1C in the last 72 hours. Fasting Lipid Panel: No results for input(s): CHOL, HDL, LDLCALC, TRIG, CHOLHDL, LDLDIRECT in the last 72 hours. Thyroid Function Tests: No results for input(s): TSH, T4TOTAL, T3FREE, THYROIDAB in the last 72 hours.  Invalid input(s): FREET3 Anemia Panel: No results for input(s): VITAMINB12, FOLATE, FERRITIN, TIBC, IRON, RETICCTPCT in the last 72 hours.  RADIOLOGY: Ct Abdomen Pelvis W Contrast  06/05/2015  CLINICAL DATA:  Back spasms.  Leukocytosis. EXAM: CT ABDOMEN AND PELVIS WITH CONTRAST TECHNIQUE: Multidetector CT imaging of the abdomen and pelvis was performed using the standard protocol following bolus administration of intravenous contrast. CONTRAST:  75mL OMNIPAQUE IOHEXOL 300 MG/ML  SOLN COMPARISON:  None. FINDINGS: Lower chest and abdominal wall: Chronic cardiomegaly. Remote subendocardial left ventricular infarct, greatest along the anterior wall. Chronic large pericardial effusion when compared to previous CT. Chronic small right pleural effusion with atelectasis or scar. Hepatobiliary: Liver enhancement pattern suggests  passive congestion. No focal lesion.Cholelithiasis without evidence of acute cholecystitis. No duct enlargement Pancreas: Unremarkable. Spleen: Unremarkable. Adrenals/Urinary Tract: Negative adrenals. Bilateral renal cortical thinning. Small nonobstructive bilateral renal calculi. Renal sinus cysts. Chronic hemorrhagic or proteinaceous cyst in the upper pole left kidney measuring 13 mm, also seen on April 2016 chest CT. Unremarkable decompressed bladder. Reproductive:Multiple uterine fibroids measuring up to 23 mm. Stomach/Bowel: No obstruction. Colonic  diverticulosis without active inflammation. No evidence of appendicitis. Vascular/Lymphatic: No acute vascular abnormality. No mass or adenopathy. Peritoneal: Small ascites Musculoskeletal: No acute abnormalities. IMPRESSION: 1. No acute finding. 2. Remote left ventricular infarct with chronic cardiomegaly. Chronic large pericardial effusion and small right pleural effusion. 3. Passive congestion of the liver. 4. Cholelithiasis and nephrolithiasis. 5. Colonic diverticulosis. Electronically Signed   By: Marnee Spring M.D.   On: 06/05/2015 01:33   Dg Chest Port 1 View  06/04/2015  CLINICAL DATA:  Shortness of breath EXAM: PORTABLE CHEST 1 VIEW COMPARISON:  03/22/2015 chest radiograph. FINDINGS: Right internal jugular central venous catheter terminates at the cavoatrial junction. Stable cardiomediastinal silhouette with moderate cardiomegaly. No pneumothorax. Small bilateral pleural effusions. Mild pulmonary edema. Mild bibasilar atelectasis. There is a new subcentimeter nodular density in the peripheral left mid lung. IMPRESSION: 1. Moderate cardiomegaly and mild pulmonary edema, in keeping with mild congestive heart failure. 2. Small bilateral pleural effusions and mild bibasilar atelectasis. 3. New subcentimeter nodular density in the peripheral left mid lung, cannot exclude a pulmonary nodule. Recommend correlation with chest CT on a short term outpatient basis. Electronically Signed   By: Delbert Phenix M.D.   On: 06/04/2015 20:44    PHYSICAL EXAM General: Looks very uncomfortable. Constantly trying to reposition for comfort.  Neck: JVP not elevated, no thyromegaly or thyroid nodule.  Lungs: Decreased breath sounds at bases bilaterally with mild crackles CV: Lateral PMI.  Heart regular S1/S2, no S3/S4, no murmur.  No peripheral edema.   Abdomen: Soft, NT, ND, no HSM. No bruits or masses. +BS  Neurologic: Alert and oriented x 3.  Psych: Normal affect. Extremities: No clubbing or cyanosis.    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  WBCs high, concern for tunneled catheter infection with possible spread cause discitis/back pain.  GPCs in blood.   1. ID: MSSA and Klebsiella bacteremia, likely line sepsis.  Line out.  ID following.  Remains afebrile on Unasyn.  Concern that back pain could represent discitis.  TEE negative for vegetation or endocarditis. - Will eventually need to replace central access (likely tunneled catheter on left when cleared by ID).   - Continue abx.   - Planning MRI of back to look for discitis today, will have to be sedated. Currently NPO for this.  2. Acute on chronic systolic CHF: Last echo with EF 20%, moderate pericardial effusion.  She is milrinone dependent. Weight down with IV Lasix, now looks euvolemic.  - Continue milrinone via PIV, eventually needs central access for home.  - Transition to torsemide 20 daily and can restart spironolactone 12.5 daily.   - Continue digoxin.  3. LV mural thrombus: Continue coumadin.  4. Needs work with PT, possible CIR versus rehab placement.    [name]  06/08/2015 7:33 AM  Advanced Heart Failure Team Pager [phone] (M-F; 7a - 4p)  Please contact [facility] for night-coverage after hours (4p -7a ) and  weekends on [email or web address]

## 2015-06-08 NOTE — Progress Notes (Signed)
Inpatient Rehabilitation  Pt. Currently off the floor.  I will follow up tomorrow.  Please call if questions.  Weldon Picking PT Inpatient Rehab Admissions Coordinator Cell [phone] Office [phone]

## 2015-06-08 NOTE — Anesthesia Procedure Notes (Signed)
Procedure Name: LMA Insertion Date/Time: 06/08/2015 3:45 PM Performed by: Jefm Miles E Pre-anesthesia Checklist: Patient identified, Emergency Drugs available, Suction available, Patient being monitored and Timeout performed Patient Re-evaluated:Patient Re-evaluated prior to inductionOxygen Delivery Method: Circle system utilized Preoxygenation: Pre-oxygenation with 100% oxygen Intubation Type: IV induction and Inhalational induction Ventilation: Mask ventilation without difficulty LMA: LMA inserted LMA Size: 4.0 Number of attempts: 2 Placement Confirmation: positive ETCO2 and breath sounds checked- equal and bilateral Tube secured with: Tape Dental Injury: Teeth and Oropharynx as per pre-operative assessment  Comments: First attempt with LMA 3, large leak, 2nd attempt with LMA 4, good tidal volumes, no leaking.

## 2015-06-08 NOTE — Progress Notes (Signed)
ANTICOAGULATION CONSULT NOTE - Follow Up Consult  Pharmacy Consult for heparin bridge to warfarin Indication: atrial fibrillation and history of LV thrombus  No Known Allergies  Patient Measurements: Height: 5\' 2"  (157.5 cm) Weight: 97 lb 14.2 oz (44.4 kg) IBW/kg (Calculated) : 50.1 Heparin Dosing Weight:46  Vital Signs: Temp: 97.9 F (36.6 C) (03/15 0700) Temp Source: Oral (03/15 0700) BP: 106/72 mmHg (03/15 0700) Pulse Rate: 69 (03/15 1030)  Labs:  Recent Labs  06/06/15 0424 06/06/15 0817 06/06/15 2005 06/07/15 0245 06/07/15 0627 06/08/15 0320  HGB 10.1*  --   --  10.8*  --  11.5*  HCT 32.0*  --   --  34.6*  --  35.1*  PLT 225  --   --  266  --  266  LABPROT  --  21.0*  --   --  26.7* 24.8*  INR  --  1.81*  --   --  2.50* 2.27*  HEPARINUNFRC  --  <0.10* 0.27*  --  0.46  --   CREATININE 0.92  --   --  0.91  --  0.92    Estimated Creatinine Clearance: 41 mL/min (by C-G formula based on Cr of 0.92).  Assessment: 70 year old female with afib on chronic coumadin (INR 2.5 increased from 1.8 after 2 doses of 7.5 mg, today INR is 2.27) and history of LV thrombus. RN reports no s/s of bleeding. CBC stable.  PTA warfarin dose: 6mg  daily except for 4mg  on M/W/F  Goal of Therapy:  INR 2-3 Heparin level 0.3-0.7 units/ml Monitor platelets by anticoagulation protocol: Yes   Plan:  Warfarin 4 mg *1 tonight Discontinue heparin Daily CBC and INR   Sherron Monday, PharmD Clinical Pharmacy Resident Pager: [phone] 06/08/2015 10:54 AM

## 2015-06-08 NOTE — Progress Notes (Signed)
PATIENT DETAILS Name: Katrina Peck Age: 70 y.o. Sex: female Date of Birth: 11-06-1945 Admit Date: 06/04/2015 Admitting Physician Lorretta Harp, MD YME:BRAXE,[email or web address], MD   Brief narrative: 70 year old female with history of chronic systolic heart failure-on milrinone infusion at home via a tunneled catheter, presented to the hospital with severe lower back pain. Found to have significant leukocytosis. Further evaluation showed MSSA and Klebsiella bacteremia, likely from catheter-related bloodstream infection.Gven severe back pain-concern for discitis, scheduled for MRI with sedation today.   Subjective: Continues to have back pain-but seems more comfortable than the past few days.  Assessment/Plan: Principal Problem: Sepsis due to MSSA and Klebsiella  bacteremia:Secondary to catheter related blood stream infection and possible discitis. Tunneled catheter removed by IR on 3/13.  MRI entire spine with gen anesthesia/sedation scheduled for today. TEE 3/13 without any vegetations.Continue empiric antibiotics, infectious disease consulted. See below for further details.  Active Problems: Worsening back pain: Given significant leukocytosis, bacteremia-concern for discitis. Unfortunately not able to lie flat for a MRI (has CHF). MRI with gen anesthesia/sedation scheduled for today-spoke with Dr Jearld Pies 3/14-ok to proceed from a cardiac standpoint.  Acute on chronic systolic heart failure (EF 20% by TTE on December 2016): Hx of Nonischemic cardiomyopathy.LHC(4/16) with no significant coronary disease. Continues to have mild overload on exam- weight decreased to 97 lbs (103 pounds on admission),-1.7 L so far. Initially was on IV Lasix, now has been transitioned to oral Demadex. Continue milrinone and digoxin. Will need to reinsert PICC line prior to discharge. CHF team following  History of LV thrombus: On chronic Coumadin. Pharmacy dosing.  Paroxysmal atrial fibrillation: Rate  controlled with amiodarone, on chronic Coumadin.CHADS2VASC 5.  Chronic pericardial effusion: s/p window 07/26/14-cytology was negative. 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  Protein-calorie malnutrition, severe:nutriton evaluation  Disposition: Remain inpatient-CIR when workup complete.  Antimicrobial agents  See below  Anti-infectives    Start     Dose/Rate Route Frequency Ordered Stop   06/07/15 1600  Ampicillin-Sulbactam (UNASYN) 3 g in sodium chloride 0.9 % 100 mL IVPB     3 g 100 mL/hr over 60 Minutes Intravenous Every 6 hours 06/07/15 1455     06/07/15 0813  vancomycin (VANCOCIN) IVPB 750 mg/150 ml premix     750 mg 150 mL/hr over 60 Minutes Intravenous Every 24 hours 06/07/15 0813 06/07/15 1000   06/06/15 0400  vancomycin (VANCOCIN) IVPB 750 mg/150 ml premix  Status:  Discontinued     750 mg 150 mL/hr over 60 Minutes Intravenous Every 24 hours 06/05/15 0302 06/07/15 0813   06/05/15 1400  piperacillin-tazobactam (ZOSYN) IVPB 3.375 g  Status:  Discontinued     3.375 g 12.5 mL/hr over 240 Minutes Intravenous 3 times per day 06/05/15 0302 06/05/15 1202   06/05/15 1215  cefTRIAXone (ROCEPHIN) 2 g in dextrose 5 % 50 mL IVPB  Status:  Discontinued     2 g 100 mL/hr over 30 Minutes Intravenous Every 24 hours 06/05/15 1202 06/07/15 1422   06/05/15 0315  piperacillin-tazobactam (ZOSYN) IVPB 3.375 g     3.375 g 100 mL/hr over 30 Minutes Intravenous STAT 06/05/15 0302 06/05/15 0610   06/05/15 0315  vancomycin (VANCOCIN) IVPB 1000 mg/200 mL premix     1,000 mg 200  mL/hr over 60 Minutes Intravenous NOW 06/05/15 0302 06/05/15 0533      DVT Prophylaxis: Coumadin  Code Status: Full code   Family Communication None at bedside  Procedures: None  CONSULTS:  cardiology and  ID  Time spent 30 minutes-Greater than 50% of this time was spent in counseling, explanation of diagnosis, planning of further management, and coordination of care.  MEDICATIONS: Scheduled Meds: . amiodarone  100 mg Oral Daily  . ampicillin-sulbactam (UNASYN) IV  3 g Intravenous Q6H  . digoxin  0.0625 mg Oral Daily  . feeding supplement (ENSURE ENLIVE)  237 mL Oral BID BM  . magnesium oxide  200 mg Oral BID  . polyethylene glycol  17 g Oral Daily  . sodium chloride flush  3 mL Intravenous Q12H  . spironolactone  12.5 mg Oral Daily  . torsemide  20 mg Oral Daily  . Warfarin - Pharmacist Dosing Inpatient   Does not apply q1800   Continuous Infusions: . milrinone 0.25 mcg/kg/min (06/08/15 0811)   PRN Meds:.acetaminophen **OR** acetaminophen, HYDROmorphone (DILAUDID) injection, methocarbamol, morphine injection, oxyCODONE-acetaminophen, sodium chloride flush    PHYSICAL EXAM: Vital signs in last 24 hours: Filed Vitals:   06/08/15 0431 06/08/15 0500 06/08/15 0600 06/08/15 0700  BP: 102/70 118/73 103/67 106/72  Pulse: 72 72 70 65  Temp: 97.5 F (36.4 C)   97.9 F (36.6 C)  TempSrc: Oral   Oral  Resp:    16  Height:      Weight: 44.4 kg (97 lb 14.2 oz)     SpO2: 100% 96% 99% 100%    Weight change: -0.8 kg (-1 lb 12.2 oz) Filed Weights   06/05/15 1630 06/07/15 0400 06/08/15 0431  Weight: 45 kg (99 lb 3.3 oz) 45.2 kg (99 lb 10.4 oz) 44.4 kg (97 lb 14.2 oz)   Body mass index is 17.9 kg/(m^2).   Gen Exam: Awake and alert with clear speech.   Neck: Supple Chest: Clear bilaterally CVS: S1 S2 Regular Abdomen: soft, BS +, non tender, non distended.  Extremities: trace edema, lower extremities warm to touch,+chronic venous stasis changes Neurologic:gen weakness-but not focal Skin: No Rash.   Wounds: N/A.    Intake/Output from previous day:  Intake/Output Summary (Last 24 hours) at 06/08/15 0914 Last data filed at 06/08/15 0507  Gross per 24 hour  Intake  818.9 ml   Output    725 ml  Net   93.9 ml     LAB RESULTS: CBC  Recent Labs Lab 06/04/15 2048 06/05/15 0330 06/06/15 0424 06/07/15 0245 06/08/15 0320  WBC 24.9* 21.4* 19.9* 16.8* 13.0*  HGB 11.4* 10.5* 10.1* 10.8* 11.5*  HCT 35.5* 32.7* 32.0* 34.6* 35.1*  PLT 235 209 225 266 266  MCV 84.1 84.5 84.4 84.4 84.6  MCH 27.0 27.1 26.6 26.3 27.7  MCHC 32.1 32.1 31.6 31.2 32.8  RDW 15.7* 15.8* 15.6* 15.2 15.1    Chemistries   Recent Labs Lab 06/04/15 2048 06/05/15 0330 06/06/15 0424 06/07/15 0245 06/08/15 0320  NA 142 139 134* 138 137  K 4.6 4.6 3.8 3.7 4.6  CL 103 105 101 101 100*  CO2 20* 23 25 26 25   GLUCOSE 129* 133* 116* 101* 100*  BUN 19 19 19 16 17   CREATININE 1.07* 1.09* 0.92 0.91 0.92  CALCIUM 9.8 9.1 8.8* 8.7* 8.8*    CBG:  Recent Labs Lab 06/06/15 0825 06/07/15 0809 06/08/15 0847  GLUCAP 118* 96 97    GFR Estimated Creatinine Clearance: 41 mL/min (by C-G  formula based on Cr of 0.92).  Coagulation profile  Recent Labs Lab 06/04/15 2048 06/05/15 0330 06/06/15 0817 06/07/15 0627 06/08/15 0320  INR 1.72* 1.84* 1.81* 2.50* 2.27*    Cardiac Enzymes No results for input(s): CKMB, TROPONINI, MYOGLOBIN in the last 168 hours.  Invalid input(s): CK  Invalid input(s): POCBNP No results for input(s): DDIMER in the last 72 hours. No results for input(s): HGBA1C in the last 72 hours. No results for input(s): CHOL, HDL, LDLCALC, TRIG, CHOLHDL, LDLDIRECT in the last 72 hours. No results for input(s): TSH, T4TOTAL, T3FREE, THYROIDAB in the last 72 hours.  Invalid input(s): FREET3 No results for input(s): VITAMINB12, FOLATE, FERRITIN, TIBC, IRON, RETICCTPCT in the last 72 hours. No results for input(s): LIPASE, AMYLASE in the last 72 hours.  Urine Studies No results for input(s): UHGB, CRYS in the last 72 hours.  Invalid input(s): UACOL, UAPR, USPG, UPH, UTP, UGL, UKET, UBIL, UNIT, UROB, ULEU, UEPI, UWBC, URBC, UBAC, CAST, UCOM,  BILUA  MICROBIOLOGY: Recent Results (from the past 240 hour(s))  Blood culture (routine x 2)     Status: None   Collection Time: 06/04/15 10:40 PM  Result Value Ref Range Status   Specimen Description BLOOD LEFT ANTECUBITAL  Final   Special Requests BOTTLES DRAWN AEROBIC AND ANAEROBIC 5CC  Final   Culture  Setup Time   Final    GRAM POSITIVE COCCI IN CLUSTERS IN BOTH AEROBIC AND ANAEROBIC BOTTLES CRITICAL RESULT CALLED TO, READ BACK BY AND VERIFIED WITH: K MOON 06/05/15 @ 1447 M VESTAL GRAM NEGATIVE RODS ANAEROBIC BOTTLE ONLY CRITICAL RESULT CALLED TO, READ BACK BY AND VERIFIED WITH: RN M LESSARD AT 1741 [id number] MARTINB CONFIRMED BY VINCE W    Culture   Final    STAPHYLOCOCCUS AUREUS KLEBSIELLA SPECIES SUSCEPTIBILITIES PERFORMED ON PREVIOUS CULTURE WITHIN THE LAST 5 DAYS.    Report Status 06/07/2015 FINAL  Final  Blood culture (routine x 2)     Status: None   Collection Time: 06/04/15 10:45 PM  Result Value Ref Range Status   Specimen Description BLOOD LEFT HAND  Final   Special Requests BOTTLES DRAWN AEROBIC AND ANAEROBIC 5CC  Final   Culture  Setup Time   Final    GRAM POSITIVE COCCI IN CLUSTERS IN BOTH AEROBIC AND ANAEROBIC BOTTLES CRITICAL RESULT CALLED TO, READ BACK BY AND VERIFIED WITH: K MOON 06/05/15 @ 1447 M VESTAL GRAM NEGATIVE RODS ANAEROBIC BOTTLE ONLY CRITICAL RESULT CALLED TO, READ BACK BY AND VERIFIED WITH: RN Tilden Fossa AT 1741 [id number] MARTINB CONFIRMED BY VINCE W    Culture STAPHYLOCOCCUS AUREUS KLEBSIELLA SPECIES   Final   Report Status 06/07/2015 FINAL  Final   Organism ID, Bacteria STAPHYLOCOCCUS AUREUS  Final   Organism ID, Bacteria KLEBSIELLA SPECIES  Final      Susceptibility   Staphylococcus aureus - MIC*    CIPROFLOXACIN <=0.5 SENSITIVE Sensitive     ERYTHROMYCIN <=0.25 SENSITIVE Sensitive     GENTAMICIN <=0.5 SENSITIVE Sensitive     OXACILLIN <=0.25 SENSITIVE Sensitive     TETRACYCLINE <=1 SENSITIVE Sensitive     VANCOMYCIN <=0.5 SENSITIVE  Sensitive     TRIMETH/SULFA <=10 SENSITIVE Sensitive     CLINDAMYCIN <=0.25 SENSITIVE Sensitive     RIFAMPIN <=0.5 SENSITIVE Sensitive     Inducible Clindamycin NEGATIVE Sensitive     * STAPHYLOCOCCUS AUREUS   Klebsiella species - MIC*    AMPICILLIN >=32 RESISTANT Resistant     CEFAZOLIN >=64 RESISTANT Resistant     CEFEPIME <=1  SENSITIVE Sensitive     CEFTAZIDIME <=1 SENSITIVE Sensitive     CEFTRIAXONE <=1 SENSITIVE Sensitive     CIPROFLOXACIN <=0.25 SENSITIVE Sensitive     GENTAMICIN <=1 SENSITIVE Sensitive     IMIPENEM <=0.25 SENSITIVE Sensitive     TRIMETH/SULFA <=20 SENSITIVE Sensitive     AMPICILLIN/SULBACTAM 4 SENSITIVE Sensitive     PIP/TAZO <=4 SENSITIVE Sensitive     * KLEBSIELLA SPECIES  MRSA PCR Screening     Status: None   Collection Time: 06/05/15  5:51 PM  Result Value Ref Range Status   MRSA by PCR NEGATIVE NEGATIVE Final    Comment:        The GeneXpert MRSA Assay (FDA approved for NASAL specimens only), is one component of a comprehensive MRSA colonization surveillance program. It is not intended to diagnose MRSA infection nor to guide or monitor treatment for MRSA infections.   Culture, blood (routine x 2)     Status: None (Preliminary result)   Collection Time: 06/05/15  6:06 PM  Result Value Ref Range Status   Specimen Description BLOOD LEFT ANTECUBITAL  Final   Special Requests BOTTLES DRAWN AEROBIC AND ANAEROBIC 5CC  Final   Culture NO GROWTH 2 DAYS  Final   Report Status PENDING  Incomplete  Culture, blood (routine x 2)     Status: None (Preliminary result)   Collection Time: 06/05/15  6:08 PM  Result Value Ref Range Status   Specimen Description BLOOD LEFT HAND  Final   Special Requests BOTTLES DRAWN AEROBIC AND ANAEROBIC 5CC  Final   Culture NO GROWTH 2 DAYS  Final   Report Status PENDING  Incomplete    RADIOLOGY STUDIES/RESULTS: Ct Abdomen Pelvis W Contrast  06/05/2015  CLINICAL DATA:  Back spasms.  Leukocytosis. EXAM: CT ABDOMEN AND  PELVIS WITH CONTRAST TECHNIQUE: Multidetector CT imaging of the abdomen and pelvis was performed using the standard protocol following bolus administration of intravenous contrast. CONTRAST:  75mL OMNIPAQUE IOHEXOL 300 MG/ML  SOLN COMPARISON:  None. FINDINGS: Lower chest and abdominal wall: Chronic cardiomegaly. Remote subendocardial left ventricular infarct, greatest along the anterior wall. Chronic large pericardial effusion when compared to previous CT. Chronic small right pleural effusion with atelectasis or scar. Hepatobiliary: Liver enhancement pattern suggests passive congestion. No focal lesion.Cholelithiasis without evidence of acute cholecystitis. No duct enlargement Pancreas: Unremarkable. Spleen: Unremarkable. Adrenals/Urinary Tract: Negative adrenals. Bilateral renal cortical thinning. Small nonobstructive bilateral renal calculi. Renal sinus cysts. Chronic hemorrhagic or proteinaceous cyst in the upper pole left kidney measuring 13 mm, also seen on April 2016 chest CT. Unremarkable decompressed bladder. Reproductive:Multiple uterine fibroids measuring up to 23 mm. Stomach/Bowel: No obstruction. Colonic diverticulosis without active inflammation. No evidence of appendicitis. Vascular/Lymphatic: No acute vascular abnormality. No mass or adenopathy. Peritoneal: Small ascites Musculoskeletal: No acute abnormalities. IMPRESSION: 1. No acute finding. 2. Remote left ventricular infarct with chronic cardiomegaly. Chronic large pericardial effusion and small right pleural effusion. 3. Passive congestion of the liver. 4. Cholelithiasis and nephrolithiasis. 5. Colonic diverticulosis. Electronically Signed   By: Marnee Spring M.D.   On: 06/05/2015 01:33   Dg Chest Port 1 View  06/04/2015  CLINICAL DATA:  Shortness of breath EXAM: PORTABLE CHEST 1 VIEW COMPARISON:  03/22/2015 chest radiograph. FINDINGS: Right internal jugular central venous catheter terminates at the cavoatrial junction. Stable  cardiomediastinal silhouette with moderate cardiomegaly. No pneumothorax. Small bilateral pleural effusions. Mild pulmonary edema. Mild bibasilar atelectasis. There is a new subcentimeter nodular density in the peripheral left mid lung. IMPRESSION: 1.  Moderate cardiomegaly and mild pulmonary edema, in keeping with mild congestive heart failure. 2. Small bilateral pleural effusions and mild bibasilar atelectasis. 3. New subcentimeter nodular density in the peripheral left mid lung, cannot exclude a pulmonary nodule. Recommend correlation with chest CT on a short term outpatient basis. Electronically Signed   By: Delbert Phenix M.D.   On: 06/04/2015 20:44    Jeoffrey Massed, MD  [facility] Pager:[phone]  If 7PM-7AM, please contact night-coverage [email or web address] Password [email or web address] 06/08/2015, 9:14 AM   LOS: 3 days

## 2015-06-08 NOTE — Care Management Important Message (Signed)
Important Message  Patient Details  Name: Katrina Peck MRN: [id number] Date of Birth: [birth year]   Medicare Important Message Given:  Yes    Jshawn Hurta P Kismet Facemire 06/08/2015, 10:56 AM

## 2015-06-08 NOTE — Transfer of Care (Signed)
Immediate Anesthesia Transfer of Care Note  Patient: Katrina Peck  Procedure(s) Performed: Procedure(s): MRI CERVICAL SPINE WITH/WITHOUT LUMBAR WITH/WITHOUT THORACIC WITH/WITHOUT       (RADIOLOGY WITH ANESTHESIA) (N/A)  Patient Location: PACU  Anesthesia Type:General  Level of Consciousness: awake  Airway & Oxygen Therapy: Patient Spontanous Breathing and Patient connected to face mask oxygen  Post-op Assessment: Report given to RN  Post vital signs: Reviewed and stable  Last Vitals:  Filed Vitals:   06/08/15 1136 06/08/15 1804  BP: 104/82 128/89  Pulse: 71   Temp: 36.6 C 36.4 C  Resp: 18 18    Complications: No apparent anesthesia complications

## 2015-06-09 ENCOUNTER — Encounter (HOSPITAL_COMMUNITY): Payer: Self-pay | Admitting: Radiology

## 2015-06-09 ENCOUNTER — Inpatient Hospital Stay (HOSPITAL_COMMUNITY): Payer: Medicare Other

## 2015-06-09 DIAGNOSIS — M4620 Osteomyelitis of vertebra, site unspecified: Secondary | ICD-10-CM

## 2015-06-09 DIAGNOSIS — R911 Solitary pulmonary nodule: Secondary | ICD-10-CM

## 2015-06-09 DIAGNOSIS — M4624 Osteomyelitis of vertebra, thoracic region: Secondary | ICD-10-CM

## 2015-06-09 DIAGNOSIS — M4644 Discitis, unspecified, thoracic region: Secondary | ICD-10-CM | POA: Insufficient documentation

## 2015-06-09 DIAGNOSIS — B9561 Methicillin susceptible Staphylococcus aureus infection as the cause of diseases classified elsewhere: Secondary | ICD-10-CM

## 2015-06-09 DIAGNOSIS — I509 Heart failure, unspecified: Secondary | ICD-10-CM

## 2015-06-09 LAB — BASIC METABOLIC PANEL
ANION GAP: 11 (ref 5–15)
BUN: 14 mg/dL (ref 6–20)
CHLORIDE: 101 mmol/L (ref 101–111)
CO2: 26 mmol/L (ref 22–32)
CREATININE: 0.82 mg/dL (ref 0.44–1.00)
Calcium: 8.6 mg/dL — ABNORMAL LOW (ref 8.9–10.3)
GFR calc non Af Amer: >60 mL/min (ref >60)
Glucose, Bld: 112 mg/dL — ABNORMAL HIGH (ref 65–99)
POTASSIUM: 3.8 mmol/L (ref 3.5–5.1)
SODIUM: 138 mmol/L (ref 135–145)

## 2015-06-09 LAB — CBC
HCT: 33.4 % — ABNORMAL LOW (ref 36.0–46.0)
HEMOGLOBIN: 10.4 g/dL — AB (ref 12.0–15.0)
MCH: 26.5 pg (ref 26.0–34.0)
MCHC: 31.1 g/dL (ref 30.0–36.0)
MCV: 85.2 fL (ref 78.0–100.0)
PLATELETS: 291 10*3/uL (ref 150–400)
RBC: 3.92 MIL/uL (ref 3.87–5.11)
RDW: 15.2 % (ref 11.5–15.5)
WBC: 12.2 10*3/uL — AB (ref 4.0–10.5)

## 2015-06-09 LAB — PROTIME-INR
INR: 2.57 — ABNORMAL HIGH (ref 0.00–1.49)
Prothrombin Time: 27.2 seconds — ABNORMAL HIGH (ref 11.6–15.2)

## 2015-06-09 LAB — GLUCOSE, CAPILLARY: Glucose-Capillary: 118 mg/dL — ABNORMAL HIGH (ref 65–99)

## 2015-06-09 MED ORDER — CEFTRIAXONE SODIUM 2 G IJ SOLR
2.0000 g | Freq: Two times a day (BID) | INTRAMUSCULAR | Status: DC
  Administered 2015-06-09 – 2015-06-10 (×2): 2 g via INTRAVENOUS
  Filled 2015-06-09 (×3): qty 2

## 2015-06-09 MED ORDER — BOOST / RESOURCE BREEZE PO LIQD
1.0000 | Freq: Three times a day (TID) | ORAL | Status: DC
Start: 2015-06-09 — End: 2015-06-13
  Administered 2015-06-10 – 2015-06-13 (×3): 1 via ORAL

## 2015-06-09 MED ORDER — WARFARIN SODIUM 2 MG PO TABS
6.0000 mg | ORAL_TABLET | Freq: Once | ORAL | Status: AC
  Administered 2015-06-09: 6 mg via ORAL
  Filled 2015-06-09: qty 3

## 2015-06-09 MED ORDER — FUROSEMIDE 10 MG/ML IJ SOLN
40.0000 mg | Freq: Two times a day (BID) | INTRAMUSCULAR | Status: DC
Start: 2015-06-09 — End: 2015-06-11
  Administered 2015-06-09 – 2015-06-10 (×4): 40 mg via INTRAVENOUS
  Filled 2015-06-09 (×4): qty 4

## 2015-06-09 NOTE — Consult Note (Signed)
   [facility] CM Inpatient Consult      Patient screened and is eligible for [facility] Care Management services. Reviewed EPIC notes. Will engage for [facility] Care Management services if and when appropriate. Will continue to follow. For further questions, please contact:  Raiford Noble, MSN-Ed, RN,BSN [facility] Liaison [phone]

## 2015-06-09 NOTE — Progress Notes (Signed)
Nutrition Follow-up  DOCUMENTATION CODES:   Non-severe (moderate) malnutrition in context of chronic illness, Underweight  INTERVENTION:   -D/c Ensure Enlive po BID, each supplement provides 350 kcal and 20 grams of protein -Boost Breeze po TID, each supplement provides 250 kcal and 9 grams of protein  NUTRITION DIAGNOSIS:   Malnutrition related to chronic illness as evidenced by mild depletion of body fat, mild depletion of muscle mass.  Ongoing  GOAL:   Patient will meet greater than or equal to 90% of their needs  Progressing  MONITOR:   PO intake, Supplement acceptance, Labs, Weight trends, Skin, I & O's  REASON FOR ASSESSMENT:   Consult Assessment of nutrition requirement/status  ASSESSMENT:   Katrina Peck is a 70 y.o. female with PMH of hypertension, systolic congestive heart failure with a year for 20% on milrinone drip,, anemia, PAF on Coumadin, anemia, who presents with back pain and back spasm.  Spoke with pt at bedside. She reports her appetite is improving, however, is limited due to back pain. Per discussion with RN, pt consumed about 50% of her breakfast. Meal completion 30-75%. PTA, pt reports good appetite (3 meals per day with several snacks, including saltines and applesauce) up until about 3 days PTA due to weakness.   Pt confirms she does not like Ensure supplements. Pt is asking which types of snacks she would be eating and discussed ways to increase protein in her diet with snack sources. Pt politely declined offer of snacks, but is amenable to try Boost Breeze supplements.   Case discussed with RN.   Per chart review, discharge disposition is SNF placement vs CIR.   Labs reviewed.   Diet Order:  Diet Heart Room service appropriate?: Yes; Fluid consistency:: Thin  Skin:  Reviewed, no issues  Last BM:  06/04/15  Height:   Ht Readings from Last 1 Encounters:  06/05/15 5\' 2"  (1.575 m)    Weight:   Wt Readings from Last 1 Encounters:   06/09/15 104 lb 4.4 oz (47.3 kg)    Ideal Body Weight:  50 kg  BMI:  Body mass index is 19.07 kg/(m^2).  Estimated Nutritional Needs:   Kcal:  1350-1550  Protein:  60-75 grams  Fluid:  1.3-1.5 L  EDUCATION NEEDS:   No education needs identified at this time  Fallan Mccarey A. Mayford Knife, RD, LDN, CDE Pager: [phone] After hours Pager: [phone]

## 2015-06-09 NOTE — Progress Notes (Signed)
Patient refused to discontinue foley catheter. Will continue to monitor. Milon Dikes, RN

## 2015-06-09 NOTE — Progress Notes (Signed)
Inpatient Rehabilitation  I met with the patient at the bedside to discuss the recommendation of CIR once pt. Is medically ready.  Pt. Currently in a lot of pain and could not actively engage in meaningful conversation around her need for rehab but says she thinks it will be along while before she can do 3 hours of therapies.  I phoned her husband and left a voice mail requesting that he call me to discuss.  Will follow for progress in therapies, tolerance for 3 hours of therapies, medical readiness, bed availability and family preferences.  Pt. Awaits midline PICC.  Is on home milrinone. My fellow admissions coordinator will follow up tomorrow.   Please call if questions.  [place] Admissions Coordinator Cell [phone] Office [phone]

## 2015-06-09 NOTE — Progress Notes (Signed)
Patient ID: Katrina Peck, female   DOB: [birth year], 70 y.o.   MRN: [id number]     SUBJECTIVE: Remains afebrile with WBCs trending down. Remains on Unasyn.  Still feeling very uncomfortable. Not taking prn pain meds very often.   Cultures with MSSA and Klebsiella.  Tunneled catheter out.  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 Creatinine stable.   Scheduled Meds: . amiodarone  100 mg Oral Daily  . ampicillin-sulbactam (UNASYN) IV  3 g Intravenous Q6H  . digoxin  0.0625 mg Oral Daily  . feeding supplement (ENSURE ENLIVE)  237 mL Oral BID BM  . magnesium oxide  200 mg Oral BID  . polyethylene glycol  17 g Oral Daily  . sodium chloride flush  3 mL Intravenous Q12H  . spironolactone  12.5 mg Oral Daily  . torsemide  20 mg Oral Daily  . warfarin  4 mg Oral ONCE-1800  . Warfarin - Pharmacist Dosing Inpatient   Does not apply q1800   Continuous Infusions: . milrinone 0.25 mcg/kg/min (06/08/15 0811)   PRN Meds:.acetaminophen **OR** acetaminophen, HYDROmorphone (DILAUDID) injection, methocarbamol, morphine injection, oxyCODONE-acetaminophen, sodium chloride flush    Filed Vitals:   06/09/15 0300 06/09/15 0400 06/09/15 0459 06/09/15 0725  BP: 96/64 96/65  97/64  Pulse: 78 76  73  Temp:   98.3 F (36.8 C) 98.2 F (36.8 C)  TempSrc:   Oral Oral  Resp:    18  Height:      Weight:   104 lb 4.4 oz (47.3 kg)   SpO2:  99%  98%    Intake/Output Summary (Last 24 hours) at 06/09/15 0730 Last data filed at 06/09/15 0400  Gross per 24 hour  Intake    984 ml  Output   1350 ml  Net   -366 ml    LABS: Basic Metabolic Panel:  Recent Labs  56/21/30 0320 06/09/15 0300  NA 137 138  K 4.6 3.8  CL 100* 101  CO2 25 26  GLUCOSE 100* 112*  BUN 17 14  CREATININE 0.92 0.82  CALCIUM  8.8* 8.6*   Liver Function Tests: No results for input(s): AST, ALT, ALKPHOS, BILITOT, PROT, ALBUMIN in the last 72 hours. No results for input(s): LIPASE, AMYLASE in the last 72 hours. CBC:  Recent Labs  06/08/15 0320 06/09/15 0300  WBC 13.0* 12.2*  HGB 11.5* 10.4*  HCT 35.1* 33.4*  MCV 84.6 85.2  PLT 266 291   Cardiac Enzymes: No results for input(s): CKTOTAL, CKMB, CKMBINDEX, TROPONINI in the last 72 hours. BNP: Invalid input(s): POCBNP D-Dimer: No results for input(s): DDIMER in the last 72 hours. Hemoglobin A1C: No results for input(s): HGBA1C in the last 72 hours. Fasting Lipid Panel: No results for input(s): CHOL, HDL, LDLCALC, TRIG, CHOLHDL, LDLDIRECT in the last 72 hours. Thyroid Function Tests: No results for input(s): TSH, T4TOTAL, T3FREE, THYROIDAB in the last 72 hours.  Invalid input(s): FREET3 Anemia Panel: No results for input(s): VITAMINB12, FOLATE, FERRITIN, TIBC, IRON, RETICCTPCT in the last 72 hours.  RADIOLOGY: Mr Cervical Spine Wo Contrast  06/08/2015  CLINICAL DATA:  Back pain, worsening recently. EXAM: MRI CERVICAL SPINE WITHOUT CONTRAST TECHNIQUE: Multiplanar, multisequence MR imaging of the cervical spine was performed. No intravenous contrast was administered. COMPARISON:  None. FINDINGS: Foramen magnum is widely patent. C1-2  is normal. 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 C3-4: Spondylosis with endplate osteophytes and bulging of the disc. Facet arthropathy left worse than right with some edema of the facet joint on the left. Narrowing of the subarachnoid space surrounding the cord. AP diameter of the canal only 7 mm at this level. Mild deformity of the cord. Foraminal stenosis bilaterally because of osteophytic encroachment, left worse than right because of the facet arthropathy. C4-5: Spondylosis with endplate osteophytes and bulging of the disc. Effacement of the subarachnoid space surrounding the cord with slight cord  deformity. AP diameter of the canal only 7 mm. Foraminal stenosis bilaterally because of osteophytic encroachment. C5-6: Spondylosis with endplate osteophytes and bulging disc material more pronounced towards the right. Effacement of the subarachnoid space surrounding the cord. AP diameter of the canal is 9 mm. Foraminal stenosis on the right that could compress the C6 nerve root. C6-7: Spondylosis with endplate osteophytes and bulging of the disc. Narrowing of the subarachnoid space surrounding the cord. AP diameter of the canal measures 9 mm. Foraminal stenosis right worse than left that could cause neural compression. C7-T1: Spondylosis with endplate osteophytes and bulging of the disc. No compressive narrowing of the central canal. Mild foraminal narrowing on the right and moderate foraminal narrowing on the left. No abnormal cord edema. 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 Canal stenosis at C3-4 and C4-5 with AP diameter of canal only 7 mm at those levels. Pronounced facet arthropathy on the left at C3-4 with edema. This could be a cause of left-sided neck pain. Foraminal stenosis on the left at C3-4 could cause neural compression. Foraminal stenosis bilaterally at C4-5 could cause neural compression. Spondylosis at C5-6 and C6-7 with canal narrowing to a diameter of 9 mm. Foraminal stenosis bilaterally at those levels that could compress either or both C6 or C7 nerve roots. Foraminal narrowing on the left at C7-T1 that could be symptomatic. Electronically Signed   By: Paulina Fusi M.D.   On: 06/08/2015 18:35   Mr Lumbar Spine Wo Contrast  06/08/2015  CLINICAL DATA:  Back pain, acute worsening. EXAM: MRI LUMBAR SPINE WITHOUT CONTRAST TECHNIQUE: Multiplanar, multisequence MR imaging of the lumbar spine was performed. No intravenous contrast was administered. COMPARISON:  CT abdomen 06/05/2015 FINDINGS: Sagittal STIR imaging was performed. We  are asked to abbreviated the study by the clinical service. There are non-compressive disc bulges at L2-3, L3-4, L4-5 and L5-S1. There is facet arthropathy at L4-5 and L5-S1 with 2 mm of anterolisthesis at L4-5. There is not appear to be any compressive stenosis of the central canal. Lower lumbar facet arthropathy could contribute to back pain, but this would likely be chronic. 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 The facet arthropathy could be associated with low back pain. Electronically Signed   By: Paulina Fusi M.D.   On: 06/08/2015 18:37   Mr Thoracic Spine W Wo Contrast  06/08/2015  CLINICAL DATA:  Severe back pain with acute worsening. EXAM: MRI THORACIC SPINE WITHOUT AND WITH CONTRAST TECHNIQUE: Multiplanar and multiecho pulse sequences of the thoracic spine were obtained without and with intravenous contrast. CONTRAST:  10mL MULTIHANCE GADOBENATE DIMEGLUMINE 529 MG/ML IV SOLN COMPARISON:  None. FINDINGS: T1-2:  Normal. T2-3:  Bulging of the disc.  No compressive stenosis. T3-4 through T10-11: Normal. No disc pathology. No fracture or focal  bone lesion. The patient does have a layering pleural effusion on the right. T11-12: Spondyloarthropathy at this level. Mild edema within the disc space and of the adjacent endplates at T11 and T12. The surrounding soft tissues appear to show edematous change. This appearance could be due to single level degenerative spondyloarthropathy, but the possibility of discitis osteomyelitis certainly exists on the basis of the imaging. 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 Edema within the disc and within the T11 and T12 vertebral bodies. Surrounding edematous change in the paraspinous soft tissues. No evidence of epidural collection or neural compression. Sterile degenerative spondylosis can have this  appearance, particularly in dialysis patients, but there is considerable concern for infection in this case. Electronically Signed   By: Paulina Fusi M.D.   On: 06/08/2015 18:42   Ct Abdomen Pelvis W Contrast  06/05/2015  CLINICAL DATA:  Back spasms.  Leukocytosis. EXAM: CT ABDOMEN AND PELVIS WITH CONTRAST TECHNIQUE: Multidetector CT imaging of the abdomen and pelvis was performed using the standard protocol following bolus administration of intravenous contrast. CONTRAST:  75mL OMNIPAQUE IOHEXOL 300 MG/ML  SOLN COMPARISON:  None. FINDINGS: Lower chest and abdominal wall: Chronic cardiomegaly. Remote subendocardial left ventricular infarct, greatest along the anterior wall. Chronic large pericardial effusion when compared to previous CT. Chronic small right pleural effusion with atelectasis or scar. Hepatobiliary: Liver enhancement pattern suggests passive congestion. No focal lesion.Cholelithiasis without evidence of acute cholecystitis. No duct enlargement Pancreas: Unremarkable. Spleen: Unremarkable. Adrenals/Urinary Tract: Negative adrenals. Bilateral renal cortical thinning. Small nonobstructive bilateral renal calculi. Renal sinus cysts. Chronic hemorrhagic or proteinaceous cyst in the upper pole left kidney measuring 13 mm, also seen on April 2016 chest CT. Unremarkable decompressed bladder. Reproductive:Multiple uterine fibroids measuring up to 23 mm. Stomach/Bowel: No obstruction. Colonic diverticulosis without active inflammation. No evidence of appendicitis. Vascular/Lymphatic: No acute vascular abnormality. No mass or adenopathy. Peritoneal: Small ascites Musculoskeletal: No acute abnormalities. IMPRESSION: 1. No acute finding. 2. Remote left ventricular infarct with chronic cardiomegaly. Chronic large pericardial effusion and small right pleural effusion. 3. Passive congestion of the liver. 4. Cholelithiasis and nephrolithiasis. 5. Colonic diverticulosis. Electronically Signed   By: Marnee Spring  M.D.   On: 06/05/2015 01:33   Dg Chest Port 1 View  06/04/2015  CLINICAL DATA:  Shortness of breath EXAM: PORTABLE CHEST 1 VIEW COMPARISON:  03/22/2015 chest radiograph. FINDINGS: Right internal jugular central venous catheter terminates at the cavoatrial junction. Stable cardiomediastinal silhouette with moderate cardiomegaly. No pneumothorax. Small bilateral pleural effusions. Mild pulmonary edema. Mild bibasilar atelectasis. There is a new subcentimeter nodular density in the peripheral left mid lung. IMPRESSION: 1. Moderate cardiomegaly and mild pulmonary edema, in keeping with mild congestive heart failure. 2. Small bilateral pleural effusions and mild bibasilar atelectasis. 3. New subcentimeter nodular density in the peripheral left mid lung, cannot exclude a pulmonary nodule. Recommend correlation with chest CT on a short term outpatient basis. Electronically Signed   By: Delbert Phenix M.D.   On: 06/04/2015 20:44    PHYSICAL EXAM General: Appears older than stated age, Chronically ill appearing, remains in discomfort and pain  Neck: JVP 9-10 cm, no thyromegaly or thyroid nodule.  Lungs: Decreased bases, normal effort CV: Lateral PMI.  Heart regular S1/S2, no S3/S4, no murmur.  No peripheral edema.   Abdomen: Soft, NT, ND, no HSM. No bruits or masses. +BS  Neurologic: Alert and oriented x 3.  Psych: Normal affect. Extremities: No clubbing or cyanosis.   TELEMETRY: Reviewed, NSR  ASSESSMENT AND PLAN: 70 yo with nonischemic cardiomyopathy, chronic moderate pleural effusion, paroxysmal atrial fibrillation, and LV thrombus on home milrinone presented with severe back pain.  WBCs high, concern for tunneled catheter infection with possible spread cause discitis/back pain.  GPCs in blood.   1. ID: MSSA and Klebsiella bacteremia, likely line sepsis.  Line out.  ID following.  Remains afebrile on Unasyn.  Concern that back pain could represent discitis.  TEE negative for vegetation or  endocarditis. - Has been cleared by ID for replacing central access (likely tunneled catheter on left) - Continue abx.   - MRI with concerns for T11-T12 osteomyelitis. - Will ask ID about Unasyn with high sodium load. 2. Acute on chronic systolic CHF: Last echo with EF 20%, moderate pericardial effusion.  She is milrinone dependent.  - Continue milrinone via PIV, eventually needs central access for home. She is cleared for replacing central access per ID.  - Continue torsemide 20 daily and spironolactone 12.5 daily.   - Continue digoxin.  3. LV mural thrombus: Continue coumadin.  4. Needs work with PT, possible CIR versus rehab placement.    Mariam Dollar Tillery PA-C 06/09/2015 7:30 AM  Advanced Heart Failure Team Pager [phone] (M-F; 7a - 4p)  Please contact [facility] for night-coverage after hours (4p -7a ) and weekends on [email or web address]  Patient seen with PA, agree with the above note.  Has vertebral osteomyelitis, remains on antibiotics and still with neck/back pain.  Abx per IR.  To get tunneled catheter replaced with followup blood cultures negative.   She does have some volume overload on exam, getting sodium load with Unasyn.  Will make Lasix 40 mg IV bid today and hold torsemide.   Needs PT work, will need CIR versus SNF it appears.  [name] 06/09/2015 9:01 AM

## 2015-06-09 NOTE — Progress Notes (Signed)
[facility]   Reason for visit: Follow up on MSSA bacteremia with discitis/osteomyelitis.   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  MRI personally reviewed.    No fever, continued back and shoulder pain.  Repeat blood cultures clear.    Physical Exam: Constitutional:  Filed Vitals:   06/09/15 0924 06/09/15 1200  BP:  101/69  Pulse: 78 71  Temp:  98 F (36.7 C)  Resp:  16   patient appears in NAD; frail appearing Respiratory: Normal respiratory effort; CTA B Cardiovascular: RRR, difficult to ascultate  Review of Systems: Constitutional: negative for chills Cardiovascular: negative for chest pain Musculoskeletal: positive for back pain, negative for myalgias  Lab Results  Component Value Date   WBC 12.2* 06/09/2015   HGB 10.4* 06/09/2015   HCT 33.4* 06/09/2015   MCV 85.2 06/09/2015   PLT 291 06/09/2015    Lab Results  Component Value Date   CREATININE 0.82 06/09/2015   BUN 14 06/09/2015   NA 138 06/09/2015   K 3.8 06/09/2015   CL 101 06/09/2015   CO2 26 06/09/2015    Lab Results  Component Value Date   ALT 25 06/05/2015   AST 21 06/05/2015   ALKPHOS 130* 06/05/2015     Microbiology: Recent Results (from the past 240 hour(s))  Blood culture (routine x 2)     Status: None   Collection Time: 06/04/15 10:40 PM  Result Value Ref Range Status   Specimen Description BLOOD LEFT ANTECUBITAL  Final   Special Requests BOTTLES DRAWN AEROBIC AND ANAEROBIC 5CC  Final   Culture  Setup Time   Final    GRAM POSITIVE COCCI IN CLUSTERS IN BOTH AEROBIC AND ANAEROBIC BOTTLES CRITICAL RESULT CALLED TO, READ BACK BY AND VERIFIED WITH: K MOON 06/05/15 @ 1447 M VESTAL GRAM NEGATIVE RODS ANAEROBIC BOTTLE ONLY CRITICAL RESULT CALLED TO, READ BACK BY AND VERIFIED WITH: RN M LESSARD AT [id number] MARTINB CONFIRMED BY VINCE W    Culture   Final    STAPHYLOCOCCUS AUREUS KLEBSIELLA SPECIES SUSCEPTIBILITIES PERFORMED  ON PREVIOUS CULTURE WITHIN THE LAST 5 DAYS.    Report Status 06/07/2015 FINAL  Final  Blood culture (routine x 2)     Status: None   Collection Time: 06/04/15 10:45 PM  Result Value Ref Range Status   Specimen Description BLOOD LEFT HAND  Final   Special Requests BOTTLES DRAWN AEROBIC AND ANAEROBIC 5CC  Final   Culture  Setup Time   Final    GRAM POSITIVE COCCI IN CLUSTERS IN BOTH AEROBIC AND ANAEROBIC BOTTLES CRITICAL RESULT CALLED TO, READ BACK BY AND VERIFIED WITH: K MOON 06/05/15 @ 1447 M VESTAL GRAM NEGATIVE RODS ANAEROBIC BOTTLE ONLY CRITICAL RESULT CALLED TO, READ BACK BY AND VERIFIED WITH: RN Tilden Fossa AT [id number] MARTINB CONFIRMED BY VINCE W    Culture STAPHYLOCOCCUS AUREUS KLEBSIELLA SPECIES   Final   Report Status 06/07/2015 FINAL  Final   Organism ID, Bacteria STAPHYLOCOCCUS AUREUS  Final   Organism ID, Bacteria KLEBSIELLA SPECIES  Final      Susceptibility   Staphylococcus aureus - MIC*    CIPROFLOXACIN <=0.5 SENSITIVE Sensitive     ERYTHROMYCIN <=0.25 SENSITIVE Sensitive     GENTAMICIN <=0.5 SENSITIVE Sensitive     OXACILLIN <=0.25 SENSITIVE Sensitive     TETRACYCLINE <=1 SENSITIVE Sensitive     VANCOMYCIN <=0.5 SENSITIVE Sensitive     TRIMETH/SULFA <=10 SENSITIVE Sensitive     CLINDAMYCIN <=  0.25 SENSITIVE Sensitive     RIFAMPIN <=0.5 SENSITIVE Sensitive     Inducible Clindamycin NEGATIVE Sensitive     * STAPHYLOCOCCUS AUREUS   Klebsiella species - MIC*    AMPICILLIN >=32 RESISTANT Resistant     CEFAZOLIN >=64 RESISTANT Resistant     CEFEPIME <=1 SENSITIVE Sensitive     CEFTAZIDIME <=1 SENSITIVE Sensitive     CEFTRIAXONE <=1 SENSITIVE Sensitive     CIPROFLOXACIN <=0.25 SENSITIVE Sensitive     GENTAMICIN <=1 SENSITIVE Sensitive     IMIPENEM <=0.25 SENSITIVE Sensitive     TRIMETH/SULFA <=20 SENSITIVE Sensitive     AMPICILLIN/SULBACTAM 4 SENSITIVE Sensitive     PIP/TAZO <=4 SENSITIVE Sensitive     * KLEBSIELLA SPECIES  MRSA PCR Screening     Status: None    Collection Time: 06/05/15  5:51 PM  Result Value Ref Range Status   MRSA by PCR NEGATIVE NEGATIVE Final    Comment:        The GeneXpert MRSA Assay (FDA approved for NASAL specimens only), is one component of a comprehensive MRSA colonization surveillance program. It is not intended to diagnose MRSA infection nor to guide or monitor treatment for MRSA infections.   Culture, blood (routine x 2)     Status: None (Preliminary result)   Collection Time: 06/05/15  6:06 PM  Result Value Ref Range Status   Specimen Description BLOOD LEFT ANTECUBITAL  Final   Special Requests BOTTLES DRAWN AEROBIC AND ANAEROBIC 5CC  Final   Culture NO GROWTH 4 DAYS  Final   Report Status PENDING  Incomplete  Culture, blood (routine x 2)     Status: None (Preliminary result)   Collection Time: 06/05/15  6:08 PM  Result Value Ref Range Status   Specimen Description BLOOD LEFT HAND  Final   Special Requests BOTTLES DRAWN AEROBIC AND ANAEROBIC 5CC  Final   Culture NO GROWTH 4 DAYS  Final   Report Status PENDING  Incomplete    Impression/Plan:  1. MSSA bacteremia - will change to ceftriaxone with about 1/2 salt load.  TEE reported to be negative. Repeat blood cultures remain negative.  To get tunneled catheter. 2. Discitis/osteomyelitis - will need prolonged IV antibiotics.   3. Heart failure - as above, will stop Unasyn due to salt load.

## 2015-06-09 NOTE — Progress Notes (Signed)
Physical Therapy Treatment Patient Details Name: Katrina Peck MRN: [id number] DOB: [birth year] Today's Date: 06/09/2015    History of Present Illness 70 yo female admitted with severe back pain workup underway to r/o discitis and sepsis. PMH: anemia, HTN, chronic systolic CHF, NICM pericardial effusion    PT Comments    Patient with improved bed mobility.  Pain limiting progress with mobility.  Agree with need for CIR at discharge.  Follow Up Recommendations  CIR     Equipment Recommendations  Rolling walker with 5" wheels;3in1 (PT)    Recommendations for Other Services       Precautions / Restrictions Precautions Precautions: Fall Restrictions Weight Bearing Restrictions: No    Mobility  Bed Mobility Overal bed mobility: Needs Assistance Bed Mobility: Supine to Sit;Sit to Supine     Supine to sit: Supervision;HOB elevated Sit to supine: Min assist;HOB elevated   General bed mobility comments: Patient able to move to sitting using her UE's to assist her LE's.  Requires increased time for all mobility.  Once upright, patient able to maintain sitting balance with min guard assist for safety.  Patient sat EOB x 12 minutes, working on static and dynamic activities.  BP remained stable during sitting.  Patient required min assist to move LE's onto bed to return to supine.  Transfers Overall transfer level: Needs assistance Equipment used: 2 person hand held assist Transfers: Sit to/from Stand           General transfer comment: Patient unable to put weight on LE's to stand due to severe back pain.  Ambulation/Gait                 Stairs            Wheelchair Mobility    Modified Rankin (Stroke Patients Only)       Balance Overall balance assessment: Needs assistance Sitting-balance support: No upper extremity supported;Feet supported Sitting balance-Leahy Scale: Good                              Cognition Arousal/Alertness:  Awake/alert Behavior During Therapy: WFL for tasks assessed/performed Overall Cognitive Status: Within Functional Limits for tasks assessed                      Exercises      General Comments        Pertinent Vitals/Pain Pain Assessment: 0-10 Pain Score: 10-Worst pain ever Pain Location: back Pain Descriptors / Indicators: Aching;Constant;Grimacing;Sharp;Sore Pain Intervention(s): Limited activity within patient's tolerance;Monitored during session;Premedicated before session;Repositioned    Home Living                      Prior Function            PT Goals (current goals can now be found in the care plan section) Acute Rehab PT Goals Patient Stated Goal: to be able to do for myself Progress towards PT goals: Progressing toward goals (Slow progress due to pain)    Frequency  Min 3X/week    PT Plan Current plan remains appropriate    Co-evaluation             End of Session   Activity Tolerance: Patient limited by pain Patient left: in bed;with call bell/phone within reach     Time: 1349-1415 PT Time Calculation (min) (ACUTE ONLY): 26 min  Charges:  $Therapeutic Activity: 23-37 mins  G Codes:      Vena Austria 06/09/2015, 2:28 PM Durenda Hurt Renaldo Fiddler, [facility] Acute Rehab Services Pager [phone]

## 2015-06-09 NOTE — Progress Notes (Signed)
ANTICOAGULATION CONSULT NOTE - Follow Up Consult  Pharmacy Consult for heparin bridge to warfarin Indication: atrial fibrillation and history of LV thrombus  No Known Allergies  Patient Measurements: Height: 5\' 2"  (157.5 cm) Weight: 104 lb 4.4 oz (47.3 kg) IBW/kg (Calculated) : 50.1 Heparin Dosing Weight:46  Vital Signs: Temp: 98.2 F (36.8 C) (03/16 0725) Temp Source: Oral (03/16 0725) BP: 97/64 mmHg (03/16 0725) Pulse Rate: 73 (03/16 0725)  Labs:  Recent Labs  06/06/15 0817 06/06/15 2005  06/07/15 0245 06/07/15 0627 06/08/15 0320 06/09/15 0300  HGB  --   --   < > 10.8*  --  11.5* 10.4*  HCT  --   --   --  34.6*  --  35.1* 33.4*  PLT  --   --   --  266  --  266 291  LABPROT 21.0*  --   --   --  26.7* 24.8* 27.2*  INR 1.81*  --   --   --  2.50* 2.27* 2.57*  HEPARINUNFRC <0.10* 0.27*  --   --  0.46  --   --   CREATININE  --   --   --  0.91  --  0.92 0.82  < > = values in this interval not displayed.  Estimated Creatinine Clearance: 49 mL/min (by C-G formula based on Cr of 0.82).  Assessment: 70 year old female with afib on chronic coumadin and history of LV thrombus. INR 2.57. Patient missed evening dose of warfarin last night. RN reports no s/s of bleeding. CBC stable.  PTA warfarin dose: 6mg  daily except for 4mg  on M/W/F  Goal of Therapy:  INR 2-3 Heparin level 0.3-0.7 units/ml Monitor platelets by anticoagulation protocol: Yes   Plan:  Warfarin 6 mg *1 tonight Discontinue heparin Daily CBC and INR   Sherron Monday, PharmD Clinical Pharmacy Resident Pager: [phone] 06/09/2015 8:04 AM

## 2015-06-09 NOTE — Anesthesia Postprocedure Evaluation (Signed)
Anesthesia Post Note  Patient: Katrina Peck  Procedure(s) Performed: Procedure(s) (LRB): MRI CERVICAL SPINE WITH/WITHOUT LUMBAR WITH/WITHOUT THORACIC WITH/WITHOUT       (RADIOLOGY WITH ANESTHESIA) (N/A)  Patient location during evaluation: PACU Anesthesia Type: General Level of consciousness: awake and alert Pain management: pain level controlled Vital Signs Assessment: post-procedure vital signs reviewed and stable Respiratory status: spontaneous breathing, nonlabored ventilation, respiratory function stable and patient connected to nasal cannula oxygen Cardiovascular status: blood pressure returned to baseline and stable Postop Assessment: no signs of nausea or vomiting Anesthetic complications: no    Last Vitals:  Filed Vitals:   06/09/15 0924 06/09/15 1200  BP:  101/69  Pulse: 78 71  Temp:  36.7 C  Resp:  16    Last Pain:  Filed Vitals:   06/09/15 1226  PainSc: 7                  Shelton Silvas

## 2015-06-09 NOTE — Progress Notes (Signed)
PATIENT DETAILS Name: Katrina Peck Age: 70 y.o. Sex: female Date of Birth: 03-28-1945 Admit Date: 06/04/2015 Admitting Physician Lorretta Harp, MD ZOX:[id number], MD   Brief narrative: 70 year old female with history of chronic systolic heart failure-on milrinone infusion at home via a tunneled catheter, presented to the hospital with severe lower back pain. Found to have significant leukocytosis. 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  Subjective: Continues to have back pain  Assessment/Plan: Principal Problem: Sepsis due to MSSA and Klebsiella  bacteremia along with T11/T12 discitis :Secondary to catheter related blood stream infection and possible T11-T12 discitis. Tunneled catheter removed by IR on 3/13.  MRI entire spine with gen anesthesia/sedation  showed T11/T12 discitis. TEE 3/13 without any vegetations. Repeat blood cultures negative, we will place order for reinsertion of midline catheter.Continue Unasyn, await IV follow-up for antibiotic duration.   Active Problems: Acute on chronic systolic heart failure (EF 20% by TTE on December 2016): Hx of Nonischemic cardiomyopathy.LHC(4/16) with no significant coronary disease. Continues to have mild overload on exam- weight unfortunately increased again, CHF team following, IV Lasix has been resumed. Continue milrinone and digoxin.  History of LV thrombus: On chronic Coumadin. Pharmacy dosing-INR therapeutic.  Paroxysmal atrial fibrillation: Rate controlled with amiodarone, on chronic Coumadin.CHADS2VASC 5.  Chronic pericardial effusion: s/p window 07/26/14-cytology was negative. CT abdomen done on admission shows a large pericardial effusion-however TEE 3/13 showed moderate pericardial effusion-no tamponade  Pul YNW:GNFAOZ secondary to LV failure  Nodular density in left mid lung on chest x-ray: CT  chest when more stable.  Protein-calorie malnutrition, severe:nutriton evaluation  Disposition: Remain inpatient-CIR vs SNF when midline PICC has been placed.  Antimicrobial agents  See below  Anti-infectives    Start     Dose/Rate Route Frequency Ordered Stop   06/07/15 1600  Ampicillin-Sulbactam (UNASYN) 3 g in sodium chloride 0.9 % 100 mL IVPB     3 g 100 mL/hr over 60 Minutes Intravenous Every 6 hours 06/07/15 1455     06/07/15 0813  vancomycin (VANCOCIN) IVPB 750 mg/150 ml premix     750 mg 150 mL/hr over 60 Minutes Intravenous Every 24 hours 06/07/15 0813 06/07/15 1000   06/06/15 0400  vancomycin (VANCOCIN) IVPB 750 mg/150 ml premix  Status:  Discontinued     750 mg 150 mL/hr over 60 Minutes Intravenous Every 24 hours 06/05/15 0302 06/07/15 0813   06/05/15 1400  piperacillin-tazobactam (ZOSYN) IVPB 3.375 g  Status:  Discontinued     3.375 g 12.5 mL/hr over 240 Minutes Intravenous 3 times per day 06/05/15 0302 06/05/15 1202   06/05/15 1215  cefTRIAXone (ROCEPHIN) 2 g in dextrose 5 % 50 mL IVPB  Status:  Discontinued     2 g 100 mL/hr over 30 Minutes Intravenous Every 24 hours 06/05/15 1202 06/07/15 1422   06/05/15 0315  piperacillin-tazobactam (ZOSYN) IVPB 3.375 g     3.375 g 100 mL/hr over 30 Minutes Intravenous STAT 06/05/15 0302 06/05/15 0610   06/05/15 0315  vancomycin (VANCOCIN) IVPB 1000 mg/200 mL premix     1,000 mg 200 mL/hr over 60 Minutes Intravenous NOW 06/05/15 0302 06/05/15 0533      DVT Prophylaxis: Coumadin  Code Status: Full code   Family Communication None at bedside  Procedures: None  CONSULTS:  cardiology and ID  Time spent 30 minutes-Greater than 50% of this time was spent  in counseling, explanation of diagnosis, planning of further management, and coordination of care.  MEDICATIONS: Scheduled Meds: . amiodarone  100 mg Oral Daily  . ampicillin-sulbactam (UNASYN) IV  3 g Intravenous Q6H  . digoxin  0.0625 mg Oral Daily  . feeding  supplement (ENSURE ENLIVE)  237 mL Oral BID BM  . furosemide  40 mg Intravenous BID  . magnesium oxide  200 mg Oral BID  . polyethylene glycol  17 g Oral Daily  . sodium chloride flush  3 mL Intravenous Q12H  . spironolactone  12.5 mg Oral Daily  . warfarin  6 mg Oral ONCE-1800  . Warfarin - Pharmacist Dosing Inpatient   Does not apply q1800   Continuous Infusions: . milrinone 0.25 mcg/kg/min (06/08/15 0811)   PRN Meds:.acetaminophen **OR** acetaminophen, HYDROmorphone (DILAUDID) injection, methocarbamol, morphine injection, oxyCODONE-acetaminophen, sodium chloride flush    PHYSICAL EXAM: Vital signs in last 24 hours: Filed Vitals:   06/09/15 0459 06/09/15 0725 06/09/15 0800 06/09/15 0924  BP:  97/64 96/63   Pulse:  73 78 78  Temp: 98.3 F (36.8 C) 98.2 F (36.8 C)    TempSrc: Oral Oral    Resp:  18    Height:      Weight: 47.3 kg (104 lb 4.4 oz)     SpO2:  98% 95%     Weight change: 2.9 kg (6 lb 6.3 oz) Filed Weights   06/07/15 0400 06/08/15 0431 06/09/15 0459  Weight: 45.2 kg (99 lb 10.4 oz) 44.4 kg (97 lb 14.2 oz) 47.3 kg (104 lb 4.4 oz)   Body mass index is 19.07 kg/(m^2).   Gen Exam: Awake and alert with clear speech.   Neck: Supple Chest: Clear bilaterally CVS: S1 S2 Regular Abdomen: soft, BS +, non tender, non distended.  Extremities: trace edema, lower extremities warm to touch,+chronic venous stasis changes Neurologic:gen weakness-but not focal Skin: No Rash.   Wounds: N/A.    Intake/Output from previous day:  Intake/Output Summary (Last 24 hours) at 06/09/15 1106 Last data filed at 06/09/15 0945  Gross per 24 hour  Intake   1310 ml  Output    650 ml  Net    660 ml     LAB RESULTS: CBC  Recent Labs Lab 06/05/15 0330 06/06/15 0424 06/07/15 0245 06/08/15 0320 06/09/15 0300  WBC 21.4* 19.9* 16.8* 13.0* 12.2*  HGB 10.5* 10.1* 10.8* 11.5* 10.4*  HCT 32.7* 32.0* 34.6* 35.1* 33.4*  PLT 209 225 266 266 291  MCV 84.5 84.4 84.4 84.6 85.2  MCH  27.1 26.6 26.3 27.7 26.5  MCHC 32.1 31.6 31.2 32.8 31.1  RDW 15.8* 15.6* 15.2 15.1 15.2    Chemistries   Recent Labs Lab 06/05/15 0330 06/06/15 0424 06/07/15 0245 06/08/15 0320 06/09/15 0300  NA 139 134* 138 137 138  K 4.6 3.8 3.7 4.6 3.8  CL 105 101 101 100* 101  CO2 23 25 26 25 26   GLUCOSE 133* 116* 101* 100* 112*  BUN 19 19 16 17 14   CREATININE 1.09* 0.92 0.91 0.92 0.82  CALCIUM 9.1 8.8* 8.7* 8.8* 8.6*    CBG:  Recent Labs Lab 06/06/15 0825 06/07/15 0809 06/08/15 0847 06/09/15 0726  GLUCAP 118* 96 97 118*    GFR Estimated Creatinine Clearance: 49 mL/min (by C-G formula based on Cr of 0.82).  Coagulation profile  Recent Labs Lab 06/05/15 0330 06/06/15 0817 06/07/15 0627 06/08/15 0320 06/09/15 0300  INR 1.84* 1.81* 2.50* 2.27* 2.57*    Cardiac Enzymes No results for  input(s): CKMB, TROPONINI, MYOGLOBIN in the last 168 hours.  Invalid input(s): CK  Invalid input(s): POCBNP No results for input(s): DDIMER in the last 72 hours. No results for input(s): HGBA1C in the last 72 hours. No results for input(s): CHOL, HDL, LDLCALC, TRIG, CHOLHDL, LDLDIRECT in the last 72 hours. No results for input(s): TSH, T4TOTAL, T3FREE, THYROIDAB in the last 72 hours.  Invalid input(s): FREET3 No results for input(s): VITAMINB12, FOLATE, FERRITIN, TIBC, IRON, RETICCTPCT in the last 72 hours. No results for input(s): LIPASE, AMYLASE in the last 72 hours.  Urine Studies No results for input(s): UHGB, CRYS in the last 72 hours.  Invalid input(s): UACOL, UAPR, USPG, UPH, UTP, UGL, UKET, UBIL, UNIT, UROB, ULEU, UEPI, UWBC, URBC, UBAC, CAST, UCOM, BILUA  MICROBIOLOGY: Recent Results (from the past 240 hour(s))  Blood culture (routine x 2)     Status: None   Collection Time: 06/04/15 10:40 PM  Result Value Ref Range Status   Specimen Description BLOOD LEFT ANTECUBITAL  Final   Special Requests BOTTLES DRAWN AEROBIC AND ANAEROBIC 5CC  Final   Culture  Setup Time   Final     GRAM POSITIVE COCCI IN CLUSTERS IN BOTH AEROBIC AND ANAEROBIC BOTTLES CRITICAL RESULT CALLED TO, READ BACK BY AND VERIFIED WITH: K MOON 06/05/15 @ 1447 M VESTAL GRAM NEGATIVE RODS ANAEROBIC BOTTLE ONLY CRITICAL RESULT CALLED TO, READ BACK BY AND VERIFIED WITH: RN M LESSARD AT 1741 [id number] MARTINB CONFIRMED BY VINCE W    Culture   Final    STAPHYLOCOCCUS AUREUS KLEBSIELLA SPECIES SUSCEPTIBILITIES PERFORMED ON PREVIOUS CULTURE WITHIN THE LAST 5 DAYS.    Report Status 06/07/2015 FINAL  Final  Blood culture (routine x 2)     Status: None   Collection Time: 06/04/15 10:45 PM  Result Value Ref Range Status   Specimen Description BLOOD LEFT HAND  Final   Special Requests BOTTLES DRAWN AEROBIC AND ANAEROBIC 5CC  Final   Culture  Setup Time   Final    GRAM POSITIVE COCCI IN CLUSTERS IN BOTH AEROBIC AND ANAEROBIC BOTTLES CRITICAL RESULT CALLED TO, READ BACK BY AND VERIFIED WITH: K MOON 06/05/15 @ 1447 M VESTAL GRAM NEGATIVE RODS ANAEROBIC BOTTLE ONLY CRITICAL RESULT CALLED TO, READ BACK BY AND VERIFIED WITH: RN Tilden Fossa AT 1741 [id number] MARTINB CONFIRMED BY VINCE W    Culture STAPHYLOCOCCUS AUREUS KLEBSIELLA SPECIES   Final   Report Status 06/07/2015 FINAL  Final   Organism ID, Bacteria STAPHYLOCOCCUS AUREUS  Final   Organism ID, Bacteria KLEBSIELLA SPECIES  Final      Susceptibility   Staphylococcus aureus - MIC*    CIPROFLOXACIN <=0.5 SENSITIVE Sensitive     ERYTHROMYCIN <=0.25 SENSITIVE Sensitive     GENTAMICIN <=0.5 SENSITIVE Sensitive     OXACILLIN <=0.25 SENSITIVE Sensitive     TETRACYCLINE <=1 SENSITIVE Sensitive     VANCOMYCIN <=0.5 SENSITIVE Sensitive     TRIMETH/SULFA <=10 SENSITIVE Sensitive     CLINDAMYCIN <=0.25 SENSITIVE Sensitive     RIFAMPIN <=0.5 SENSITIVE Sensitive     Inducible Clindamycin NEGATIVE Sensitive     * STAPHYLOCOCCUS AUREUS   Klebsiella species - MIC*    AMPICILLIN >=32 RESISTANT Resistant     CEFAZOLIN >=64 RESISTANT Resistant     CEFEPIME <=1  SENSITIVE Sensitive     CEFTAZIDIME <=1 SENSITIVE Sensitive     CEFTRIAXONE <=1 SENSITIVE Sensitive     CIPROFLOXACIN <=0.25 SENSITIVE Sensitive     GENTAMICIN <=1 SENSITIVE Sensitive  IMIPENEM <=0.25 SENSITIVE Sensitive     TRIMETH/SULFA <=20 SENSITIVE Sensitive     AMPICILLIN/SULBACTAM 4 SENSITIVE Sensitive     PIP/TAZO <=4 SENSITIVE Sensitive     * KLEBSIELLA SPECIES  MRSA PCR Screening     Status: None   Collection Time: 06/05/15  5:51 PM  Result Value Ref Range Status   MRSA by PCR NEGATIVE NEGATIVE Final    Comment:        The GeneXpert MRSA Assay (FDA approved for NASAL specimens only), is one component of a comprehensive MRSA colonization surveillance program. It is not intended to diagnose MRSA infection nor to guide or monitor treatment for MRSA infections.   Culture, blood (routine x 2)     Status: None (Preliminary result)   Collection Time: 06/05/15  6:06 PM  Result Value Ref Range Status   Specimen Description BLOOD LEFT ANTECUBITAL  Final   Special Requests BOTTLES DRAWN AEROBIC AND ANAEROBIC 5CC  Final   Culture NO GROWTH 4 DAYS  Final   Report Status PENDING  Incomplete  Culture, blood (routine x 2)     Status: None (Preliminary result)   Collection Time: 06/05/15  6:08 PM  Result Value Ref Range Status   Specimen Description BLOOD LEFT HAND  Final   Special Requests BOTTLES DRAWN AEROBIC AND ANAEROBIC 5CC  Final   Culture NO GROWTH 4 DAYS  Final   Report Status PENDING  Incomplete    RADIOLOGY STUDIES/RESULTS: Mr Cervical Spine Wo Contrast  06/08/2015  CLINICAL DATA:  Back pain, worsening recently. EXAM: MRI CERVICAL SPINE WITHOUT CONTRAST TECHNIQUE: Multiplanar, multisequence MR imaging of the cervical spine was performed. No intravenous contrast was administered. COMPARISON:  None. FINDINGS: Foramen magnum is widely patent. C1-2 is normal. 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 C3-4: Spondylosis with endplate osteophytes and  bulging of the disc. Facet arthropathy left worse than right with some edema of the facet joint on the left. Narrowing of the subarachnoid space surrounding the cord. AP diameter of the canal only 7 mm at this level. Mild deformity of the cord. Foraminal stenosis bilaterally because of osteophytic encroachment, left worse than right because of the facet arthropathy. C4-5: Spondylosis with endplate osteophytes and bulging of the disc. Effacement of the subarachnoid space surrounding the cord with slight cord deformity. AP diameter of the canal only 7 mm. Foraminal stenosis bilaterally because of osteophytic encroachment. C5-6: Spondylosis with endplate osteophytes and bulging disc material more pronounced towards the right. Effacement of the subarachnoid space surrounding the cord. AP diameter of the canal is 9 mm. Foraminal stenosis on the right that could compress the C6 nerve root. C6-7: Spondylosis with endplate osteophytes and bulging of the disc. Narrowing of the subarachnoid space surrounding the cord. AP diameter of the canal measures 9 mm. Foraminal stenosis right worse than left that could cause neural compression. C7-T1: Spondylosis with endplate osteophytes and bulging of the disc. No compressive narrowing of the central canal. Mild foraminal narrowing on the right and moderate foraminal narrowing on the left. No abnormal cord edema. 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 Canal stenosis at C3-4 and C4-5 with AP diameter of canal only 7 mm at those levels. Pronounced facet arthropathy on the left at C3-4 with edema. This could be a cause of left-sided neck pain. Foraminal stenosis on the left at C3-4 could cause neural compression. Foraminal stenosis bilaterally at C4-5 could cause neural compression. Spondylosis at C5-6 and  C6-7 with canal narrowing to a diameter of 9 mm. Foraminal stenosis bilaterally at those levels that could compress  either or both C6 or C7 nerve roots. Foraminal narrowing on the left at C7-T1 that could be symptomatic. Electronically Signed   By: Paulina Fusi M.D.   On: 06/08/2015 18:35   Mr Lumbar Spine Wo Contrast  06/08/2015  CLINICAL DATA:  Back pain, acute worsening. EXAM: MRI LUMBAR SPINE WITHOUT CONTRAST TECHNIQUE: Multiplanar, multisequence MR imaging of the lumbar spine was performed. No intravenous contrast was administered. COMPARISON:  CT abdomen 06/05/2015 FINDINGS: Sagittal STIR imaging was performed. We are asked to abbreviated the study by the clinical service. There are non-compressive disc bulges at L2-3, L3-4, L4-5 and L5-S1. There is facet arthropathy at L4-5 and L5-S1 with 2 mm of anterolisthesis at L4-5. There is not appear to be any compressive stenosis of the central canal. Lower lumbar facet arthropathy could contribute to back pain, but this would likely be chronic. 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 The facet arthropathy could be associated with low back pain. Electronically Signed   By: Paulina Fusi M.D.   On: 06/08/2015 18:37   Mr Thoracic Spine W Wo Contrast  06/08/2015  CLINICAL DATA:  Severe back pain with acute worsening. EXAM: MRI THORACIC SPINE WITHOUT AND WITH CONTRAST TECHNIQUE: Multiplanar and multiecho pulse sequences of the thoracic spine were obtained without and with intravenous contrast. CONTRAST:  11mL MULTIHANCE GADOBENATE DIMEGLUMINE 529 MG/ML IV SOLN COMPARISON:  None. FINDINGS: T1-2:  Normal. T2-3:  Bulging of the disc.  No compressive stenosis. T3-4 through T10-11: Normal. No disc pathology. No fracture or focal bone lesion. The patient does have a layering pleural effusion on the right. T11-12: Spondyloarthropathy at this level. Mild edema within the disc space and of the adjacent endplates at T11 and T12. The surrounding soft tissues appear to  show edematous change. This appearance could be due to single level degenerative spondyloarthropathy, but the possibility of discitis osteomyelitis certainly exists on the basis of the imaging. 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 Edema within the disc and within the T11 and T12 vertebral bodies. Surrounding edematous change in the paraspinous soft tissues. No evidence of epidural collection or neural compression. Sterile degenerative spondylosis can have this appearance, particularly in dialysis patients, but there is considerable concern for infection in this case. Electronically Signed   By: Paulina Fusi M.D.   On: 06/08/2015 18:42   Ct Abdomen Pelvis W Contrast  06/05/2015  CLINICAL DATA:  Back spasms.  Leukocytosis. EXAM: CT ABDOMEN AND PELVIS WITH CONTRAST TECHNIQUE: Multidetector CT imaging of the abdomen and pelvis was performed using the standard protocol following bolus administration of intravenous contrast. CONTRAST:  65mL OMNIPAQUE IOHEXOL 300 MG/ML  SOLN COMPARISON:  None. FINDINGS: Lower chest and abdominal wall: Chronic cardiomegaly. Remote subendocardial left ventricular infarct, greatest along the anterior wall. Chronic large pericardial effusion when compared to previous CT. Chronic small right pleural effusion with atelectasis or scar. Hepatobiliary: Liver enhancement pattern suggests passive congestion. No focal lesion.Cholelithiasis without evidence of acute cholecystitis. No duct enlargement Pancreas: Unremarkable. Spleen: Unremarkable. Adrenals/Urinary Tract: Negative adrenals. Bilateral renal cortical thinning. Small nonobstructive bilateral renal calculi. Renal sinus cysts. Chronic hemorrhagic or proteinaceous cyst in the upper pole left kidney measuring 13 mm, also seen on April 2016 chest CT. Unremarkable decompressed bladder. Reproductive:Multiple uterine fibroids measuring up to 23  mm. Stomach/Bowel: No obstruction. Colonic diverticulosis  without active inflammation. No evidence of appendicitis. Vascular/Lymphatic: No acute vascular abnormality. No mass or adenopathy. Peritoneal: Small ascites Musculoskeletal: No acute abnormalities. IMPRESSION: 1. No acute finding. 2. Remote left ventricular infarct with chronic cardiomegaly. Chronic large pericardial effusion and small right pleural effusion. 3. Passive congestion of the liver. 4. Cholelithiasis and nephrolithiasis. 5. Colonic diverticulosis. Electronically Signed   By: Marnee Spring M.D.   On: 06/05/2015 01:33   Dg Chest Port 1 View  06/04/2015  CLINICAL DATA:  Shortness of breath EXAM: PORTABLE CHEST 1 VIEW COMPARISON:  03/22/2015 chest radiograph. FINDINGS: Right internal jugular central venous catheter terminates at the cavoatrial junction. Stable cardiomediastinal silhouette with moderate cardiomegaly. No pneumothorax. Small bilateral pleural effusions. Mild pulmonary edema. Mild bibasilar atelectasis. There is a new subcentimeter nodular density in the peripheral left mid lung. IMPRESSION: 1. Moderate cardiomegaly and mild pulmonary edema, in keeping with mild congestive heart failure. 2. Small bilateral pleural effusions and mild bibasilar atelectasis. 3. New subcentimeter nodular density in the peripheral left mid lung, cannot exclude a pulmonary nodule. Recommend correlation with chest CT on a short term outpatient basis. Electronically Signed   By: Delbert Phenix M.D.   On: 06/04/2015 20:44    Jeoffrey Massed, MD  [facility] Pager:[phone]  If 7PM-7AM, please contact night-coverage [email or web address] Password [email or web address] 06/09/2015, 11:06 AM   LOS: 4 days

## 2015-06-10 ENCOUNTER — Inpatient Hospital Stay (HOSPITAL_COMMUNITY): Payer: Medicare Other

## 2015-06-10 DIAGNOSIS — M62830 Muscle spasm of back: Secondary | ICD-10-CM

## 2015-06-10 DIAGNOSIS — E440 Moderate protein-calorie malnutrition: Secondary | ICD-10-CM

## 2015-06-10 LAB — BASIC METABOLIC PANEL
Anion gap: 7 (ref 5–15)
BUN: 13 mg/dL (ref 6–20)
CHLORIDE: 98 mmol/L — AB (ref 101–111)
CO2: 31 mmol/L (ref 22–32)
CREATININE: 0.92 mg/dL (ref 0.44–1.00)
Calcium: 8.9 mg/dL (ref 8.9–10.3)
Glucose, Bld: 110 mg/dL — ABNORMAL HIGH (ref 65–99)
POTASSIUM: 4 mmol/L (ref 3.5–5.1)
SODIUM: 136 mmol/L (ref 135–145)

## 2015-06-10 LAB — CBC
HEMATOCRIT: 33.3 % — AB (ref 36.0–46.0)
Hemoglobin: 10.4 g/dL — ABNORMAL LOW (ref 12.0–15.0)
MCH: 26.5 pg (ref 26.0–34.0)
MCHC: 31.2 g/dL (ref 30.0–36.0)
MCV: 84.9 fL (ref 78.0–100.0)
PLATELETS: 293 10*3/uL (ref 150–400)
RBC: 3.92 MIL/uL (ref 3.87–5.11)
RDW: 15.1 % (ref 11.5–15.5)
WBC: 8.9 10*3/uL (ref 4.0–10.5)

## 2015-06-10 LAB — CULTURE, BLOOD (ROUTINE X 2)
CULTURE: NO GROWTH
Culture: NO GROWTH

## 2015-06-10 LAB — PROTIME-INR
INR: 2.16 — AB (ref 0.00–1.49)
Prothrombin Time: 23.9 seconds — ABNORMAL HIGH (ref 11.6–15.2)

## 2015-06-10 LAB — GLUCOSE, CAPILLARY: Glucose-Capillary: 84 mg/dL (ref 65–99)

## 2015-06-10 MED ORDER — WARFARIN SODIUM 2 MG PO TABS
4.0000 mg | ORAL_TABLET | Freq: Once | ORAL | Status: AC
  Administered 2015-06-10: 4 mg via ORAL
  Filled 2015-06-10: qty 2

## 2015-06-10 MED ORDER — LIDOCAINE HCL 1 % IJ SOLN
INTRAMUSCULAR | Status: AC
  Filled 2015-06-10: qty 20

## 2015-06-10 MED ORDER — DEXTROSE 5 % IV SOLN
2.0000 g | INTRAVENOUS | Status: DC
  Administered 2015-06-11 – 2015-06-13 (×3): 2 g via INTRAVENOUS
  Filled 2015-06-10 (×4): qty 2

## 2015-06-10 NOTE — Progress Notes (Signed)
Inpatient Rehabilitation  Met with patient to discuss potential IP Rehab admission.  Discussed need for patient to demonstrate the ability to tolerate 3 hours of therapy.  Will continue to follow for medical readiness, tolerance and bed availability.  Recommend team consider back up plan in case patient is medically ready and tolerance and/or bed availability is not in place.  Carmelia Roller., CCC/SLP Admission Coordinator  [place]  Cell [phone]

## 2015-06-10 NOTE — Progress Notes (Signed)
Patient ID: Katrina Peck, female   DOB: 07-Jul-1945, 70 y.o.   MRN: [id number]     SUBJECTIVE: Remains afebrile with WBCs trending down. Now on Rocephin. Feeling "a little better". Wants a longer break from her PICC line if she can have.  Moving around easier, sat up with less difficulty yesterday. Overall she seems to be in a much better mood today.   Cultures with MSSA and Klebsiella.  Tunneled catheter remains out.  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 Bed weights likely inaccurate. Creatinine stable.   Scheduled Meds: . amiodarone  100 mg Oral Daily  . cefTRIAXone (ROCEPHIN)  IV  2 g Intravenous Q12H  . digoxin  0.0625 mg Oral Daily  . feeding supplement  1 Container Oral TID BM  . furosemide  40 mg Intravenous BID  . magnesium oxide  200 mg Oral BID  . polyethylene glycol  17 g Oral Daily  . sodium chloride flush  3 mL Intravenous Q12H  . spironolactone  12.5 mg Oral Daily  . Warfarin - Pharmacist Dosing Inpatient   Does not apply q1800   Continuous Infusions: . milrinone 0.25 mcg/kg/min (06/09/15 2000)   PRN Meds:.acetaminophen **OR** acetaminophen, HYDROmorphone (DILAUDID) injection, methocarbamol, morphine injection, oxyCODONE-acetaminophen, sodium chloride flush    Filed Vitals:   06/10/15 0300 06/10/15 0400 06/10/15 0500 06/10/15 0600  BP: 88/53 93/62 101/65 97/67  Pulse: 66 65 65 65  Temp:  97.9 F (36.6 C)    TempSrc:  Oral    Resp:  18    Height:      Weight:   112 lb 14 oz (51.2 kg)   SpO2: 99% 98% 99% 98%    Intake/Output Summary (Last 24 hours) at 06/10/15 0721 Last data filed at 06/10/15 0600  Gross per 24 hour  Intake 1180.5 ml  Output   1650 ml  Net -469.5 ml    LABS: Basic Metabolic Panel:  Recent Labs  46/96/29 0300 06/10/15 0440  NA 138 136  K 3.8 4.0    CL 101 98*  CO2 26 31  GLUCOSE 112* 110*  BUN 14 13  CREATININE 0.82 0.92  CALCIUM 8.6* 8.9   Liver Function Tests: No results for input(s): AST, ALT, ALKPHOS, BILITOT, PROT, ALBUMIN in the last 72 hours. No results for input(s): LIPASE, AMYLASE in the last 72 hours. CBC:  Recent Labs  06/09/15 0300 06/10/15 0440  WBC 12.2* 8.9  HGB 10.4* 10.4*  HCT 33.4* 33.3*  MCV 85.2 84.9  PLT 291 293   Cardiac Enzymes: No results for input(s): CKTOTAL, CKMB, CKMBINDEX, TROPONINI in the last 72 hours. BNP: Invalid input(s): POCBNP D-Dimer: No results for input(s): DDIMER in the last 72 hours. Hemoglobin A1C: No results for input(s): HGBA1C in the last 72 hours. Fasting Lipid Panel: No results for input(s): CHOL, HDL, LDLCALC, TRIG, CHOLHDL, LDLDIRECT in the last 72 hours. Thyroid Function Tests: No results for input(s): TSH, T4TOTAL, T3FREE, THYROIDAB in the last 72 hours.  Invalid input(s): FREET3 Anemia Panel: No results for input(s): VITAMINB12, FOLATE, FERRITIN, TIBC, IRON, RETICCTPCT in the last 72 hours.  RADIOLOGY: Ct Chest Wo Contrast  06/09/2015  CLINICAL DATA:  Lung nodule. Hx HTN, CHF, NICM, and pericardial effusion. EXAM: CT CHEST WITHOUT CONTRAST TECHNIQUE: Multidetector CT imaging of the chest was performed following the standard protocol without IV contrast.  COMPARISON:  06/04/2015, 07/24/2014 FINDINGS: Heart: There is a large pericardial effusion, measuring 3.5 cm adjacent to the right atrium. Aortic valve calcification. No significant coronary artery calcification. Vascular structures: There is mild atherosclerotic calcification of the thoracic aorta. No associated aneurysm. Pulmonary arteries have a normal noncontrast appearance. Mediastinum/thyroid: The visualized portion of the thyroid gland has a normal appearance. No mediastinal, hilar, or axillary adenopathy. Lungs/Airways: There are bilateral pleural effusions, right greater than left. Associated bilateral lower  lobe infiltrates are also noted, right greater than left. Bibasilar calcifications are noted in the lung parenchyma, of indeterminate significance and stable since prior study. Adjacent to the left major fissure there is a small opacity which measures 8 x 5 mm and contains a central lucency. Upper abdomen: There are parenchymal calcifications within the kidneys bilaterally. Chest wall/osseous structures: Midthoracic spondylosis. IMPRESSION: 1. Large pericardial effusion. 2. Small nodule within the left major fissure, with diameter of 7 mm. This is favored be benign, possibly related to pleural effusion, a pseudo tumor. Follow-up CT of the chest is recommended in 6 months to document resolution. 3. Small bilateral pleural effusions, right greater than left. 4. Right greater than left lower lobe consolidation. Electronically Signed   By: Norva Pavlov M.D.   On: 06/09/2015 14:00   Mr Cervical Spine Wo Contrast  06/08/2015  CLINICAL DATA:  Back pain, worsening recently. EXAM: MRI CERVICAL SPINE WITHOUT CONTRAST TECHNIQUE: Multiplanar, multisequence MR imaging of the cervical spine was performed. No intravenous contrast was administered. COMPARISON:  None. FINDINGS: Foramen magnum is widely patent. C1-2 is normal. 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 C3-4: Spondylosis with endplate osteophytes and bulging of the disc. Facet arthropathy left worse than right with some edema of the facet joint on the left. Narrowing of the subarachnoid space surrounding the cord. AP diameter of the canal only 7 mm at this level. Mild deformity of the cord. Foraminal stenosis bilaterally because of osteophytic encroachment, left worse than right because of the facet arthropathy. C4-5: Spondylosis with endplate osteophytes and bulging of the disc. Effacement of the subarachnoid space surrounding the cord with slight cord deformity. AP diameter of the canal only 7 mm. Foraminal stenosis bilaterally because of  osteophytic encroachment. C5-6: Spondylosis with endplate osteophytes and bulging disc material more pronounced towards the right. Effacement of the subarachnoid space surrounding the cord. AP diameter of the canal is 9 mm. Foraminal stenosis on the right that could compress the C6 nerve root. C6-7: Spondylosis with endplate osteophytes and bulging of the disc. Narrowing of the subarachnoid space surrounding the cord. AP diameter of the canal measures 9 mm. Foraminal stenosis right worse than left that could cause neural compression. C7-T1: Spondylosis with endplate osteophytes and bulging of the disc. No compressive narrowing of the central canal. Mild foraminal narrowing on the right and moderate foraminal narrowing on the left. No abnormal cord edema. 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 Canal stenosis at C3-4 and C4-5 with AP diameter of canal only 7 mm at those levels. Pronounced facet arthropathy on the left at C3-4 with edema. This could be a cause of left-sided neck pain. Foraminal stenosis on the left at C3-4 could cause neural compression. Foraminal stenosis bilaterally at C4-5 could cause neural compression. Spondylosis at C5-6 and C6-7 with canal narrowing to a diameter of 9 mm. Foraminal stenosis bilaterally at those levels that could compress either or both C6 or C7 nerve roots. Foraminal narrowing on the  left at C7-T1 that could be symptomatic. Electronically Signed   By: Paulina Fusi M.D.   On: 06/08/2015 18:35   Mr Lumbar Spine Wo Contrast  06/08/2015  CLINICAL DATA:  Back pain, acute worsening. EXAM: MRI LUMBAR SPINE WITHOUT CONTRAST TECHNIQUE: Multiplanar, multisequence MR imaging of the lumbar spine was performed. No intravenous contrast was administered. COMPARISON:  CT abdomen 06/05/2015 FINDINGS: Sagittal STIR imaging was performed. We are asked to abbreviated the study by the clinical service. There are non-compressive disc  bulges at L2-3, L3-4, L4-5 and L5-S1. There is facet arthropathy at L4-5 and L5-S1 with 2 mm of anterolisthesis at L4-5. There is not appear to be any compressive stenosis of the central canal. Lower lumbar facet arthropathy could contribute to back pain, but this would likely be chronic. 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 The facet arthropathy could be associated with low back pain. Electronically Signed   By: Paulina Fusi M.D.   On: 06/08/2015 18:37   Mr Thoracic Spine W Wo Contrast  06/08/2015  CLINICAL DATA:  Severe back pain with acute worsening. EXAM: MRI THORACIC SPINE WITHOUT AND WITH CONTRAST TECHNIQUE: Multiplanar and multiecho pulse sequences of the thoracic spine were obtained without and with intravenous contrast. CONTRAST:  5mL MULTIHANCE GADOBENATE DIMEGLUMINE 529 MG/ML IV SOLN COMPARISON:  None. FINDINGS: T1-2:  Normal. T2-3:  Bulging of the disc.  No compressive stenosis. T3-4 through T10-11: Normal. No disc pathology. No fracture or focal bone lesion. The patient does have a layering pleural effusion on the right. T11-12: Spondyloarthropathy at this level. Mild edema within the disc space and of the adjacent endplates at T11 and T12. The surrounding soft tissues appear to show edematous change. This appearance could be due to single level degenerative spondyloarthropathy, but the possibility of discitis osteomyelitis certainly exists on the basis of the imaging. 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 Edema within the disc and within the T11 and T12 vertebral bodies. Surrounding edematous change in the paraspinous soft tissues. No evidence of epidural collection or neural compression. Sterile degenerative spondylosis can have this appearance, particularly in dialysis patients, but there is considerable concern for infection in  this case. Electronically Signed   By: Paulina Fusi M.D.   On: 06/08/2015 18:42   Ct Abdomen Pelvis W Contrast  06/05/2015  CLINICAL DATA:  Back spasms.  Leukocytosis. EXAM: CT ABDOMEN AND PELVIS WITH CONTRAST TECHNIQUE: Multidetector CT imaging of the abdomen and pelvis was performed using the standard protocol following bolus administration of intravenous contrast. CONTRAST:  79mL OMNIPAQUE IOHEXOL 300 MG/ML  SOLN COMPARISON:  None. FINDINGS: Lower chest and abdominal wall: Chronic cardiomegaly. Remote subendocardial left ventricular infarct, greatest along the anterior wall. Chronic large pericardial effusion when compared to previous CT. Chronic small right pleural effusion with atelectasis or scar. Hepatobiliary: Liver enhancement pattern suggests passive congestion. No focal lesion.Cholelithiasis without evidence of acute cholecystitis. No duct enlargement Pancreas: Unremarkable. Spleen: Unremarkable. Adrenals/Urinary Tract: Negative adrenals. Bilateral renal cortical thinning. Small nonobstructive bilateral renal calculi. Renal sinus cysts. Chronic hemorrhagic or proteinaceous cyst in the upper pole left kidney measuring 13 mm, also seen on April 2016 chest CT. Unremarkable decompressed bladder. Reproductive:Multiple uterine fibroids measuring up to 23 mm. Stomach/Bowel: No obstruction. Colonic diverticulosis without active inflammation. No evidence of appendicitis. Vascular/Lymphatic: No acute vascular abnormality. No mass or adenopathy. Peritoneal: Small ascites Musculoskeletal: No acute abnormalities. IMPRESSION: 1.  No acute finding. 2. Remote left ventricular infarct with chronic cardiomegaly. Chronic large pericardial effusion and small right pleural effusion. 3. Passive congestion of the liver. 4. Cholelithiasis and nephrolithiasis. 5. Colonic diverticulosis. Electronically Signed   By: Marnee Spring M.D.   On: 06/05/2015 01:33   Dg Chest Port 1 View  06/04/2015  CLINICAL DATA:  Shortness of  breath EXAM: PORTABLE CHEST 1 VIEW COMPARISON:  03/22/2015 chest radiograph. FINDINGS: Right internal jugular central venous catheter terminates at the cavoatrial junction. Stable cardiomediastinal silhouette with moderate cardiomegaly. No pneumothorax. Small bilateral pleural effusions. Mild pulmonary edema. Mild bibasilar atelectasis. There is a new subcentimeter nodular density in the peripheral left mid lung. IMPRESSION: 1. Moderate cardiomegaly and mild pulmonary edema, in keeping with mild congestive heart failure. 2. Small bilateral pleural effusions and mild bibasilar atelectasis. 3. New subcentimeter nodular density in the peripheral left mid lung, cannot exclude a pulmonary nodule. Recommend correlation with chest CT on a short term outpatient basis. Electronically Signed   By: Delbert Phenix M.D.   On: 06/04/2015 20:44    PHYSICAL EXAM General: Appears older than stated age, Chronically ill appearing. Much less uncomfortable today.  Neck: JVP 8-9 cm, no thyromegaly or thyroid nodule.  Lungs: Decreased bases, normal effort CV: Lateral PMI.  Heart regular S1/S2, no S3/S4, no murmur.  No edema.   Abdomen: Soft, NT, ND, no HSM. No bruits or masses. +BS  Neurologic: Alert and oriented x 3.  Psych: Normal affect. Extremities: No clubbing or cyanosis.   TELEMETRY: Reviewed, NSR  ASSESSMENT AND PLAN: 70 yo with nonischemic cardiomyopathy, chronic moderate pleural effusion, paroxysmal atrial fibrillation, and LV thrombus on home milrinone presented with severe back pain.  WBCs high, concern for tunneled catheter infection with possible spread cause discitis/back pain.  GPCs in blood.   1. ID: MSSA and Klebsiella bacteremia, likely line sepsis.  Line out.  ID following.  Remains afebrile, now on ceftriaxone.  Concern that back pain could represent discitis.  TEE negative for vegetation or endocarditis. - Has been cleared by ID for replacing central access (likely tunneled catheter on left) => will  arrange for this today. - Continue abx.   - MRI with concerns for T11-T12 osteomyelitis. - Switched from Unasyn to Rocephin with high sodium load.  2. Acute on chronic systolic CHF: Last echo with EF 20%, moderate pericardial effusion.  She is milrinone dependent.  - Continue milrinone via PIV, eventually needs central access for home. She is awaiting replacement of her  central access.  - Weight keeps going up? Though bed weights likely innaccurate.  Her legs seems slightly more full but JVP does not seem overly elevated.  Will continue lasix 40 mg IV BID today.  - Continue digoxin.  3. LV mural thrombus: Continue coumadin.  4. Deconditioning: Needs work with PT, possible CIR versus rehab placement.    Graciella Freer PA-C 06/10/2015 7:21 AM  Advanced Heart Failure Team Pager [phone] (M-F; 7a - 4p)  Please contact [facility] for night-coverage after hours (4p -7a ) and weekends on [email or web address]  Patient seen with PA, agree with the above note.  Would continue IV Lasix one more day then back to po torsemide. Will need tunneled catheter replaced today on left.  Much brighter today, ready to work with PT.  Hopefully can qualify for CIR.   [name] 06/10/2015 8:01 AM

## 2015-06-10 NOTE — Progress Notes (Signed)
[facility]   Reason for visit: Follow up on MSSA bacteremia with discitis/osteomyelitis.   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    No fever, continued back and shoulder pain but better.  Better spirits.  Repeat blood cultures clear.    Physical Exam: Constitutional:  Filed Vitals:   06/10/15 1048 06/10/15 1334  BP:  106/55  Pulse: 73 74  Temp:  98.5 F (36.9 C)  Resp:  18   patient appears in NAD; frail appearing Respiratory: Normal respiratory effort; CTA B Cardiovascular: RRR, difficult to ascultate GI: soft, nt  Review of Systems: Constitutional: negative for chills Cardiovascular: negative for chest pain Musculoskeletal: positive for back pain, negative for myalgias  Lab Results  Component Value Date   WBC 8.9 06/10/2015   HGB 10.4* 06/10/2015   HCT 33.3* 06/10/2015   MCV 84.9 06/10/2015   PLT 293 06/10/2015    Lab Results  Component Value Date   CREATININE 0.92 06/10/2015   BUN 13 06/10/2015   NA 136 06/10/2015   K 4.0 06/10/2015   CL 98* 06/10/2015   CO2 31 06/10/2015    Lab Results  Component Value Date   ALT 25 06/05/2015   AST 21 06/05/2015   ALKPHOS 130* 06/05/2015     Microbiology: Recent Results (from the past 240 hour(s))  Blood culture (routine x 2)     Status: None   Collection Time: 06/04/15 10:40 PM  Result Value Ref Range Status   Specimen Description BLOOD LEFT ANTECUBITAL  Final   Special Requests BOTTLES DRAWN AEROBIC AND ANAEROBIC 5CC  Final   Culture  Setup Time   Final    GRAM POSITIVE COCCI IN CLUSTERS IN BOTH AEROBIC AND ANAEROBIC BOTTLES CRITICAL RESULT CALLED TO, READ BACK BY AND VERIFIED WITH: K MOON 06/05/15 @ 1447 M VESTAL GRAM NEGATIVE RODS ANAEROBIC BOTTLE ONLY CRITICAL RESULT CALLED TO, READ BACK BY AND VERIFIED WITH: RN M LESSARD AT 1741 [id number] MARTINB CONFIRMED BY VINCE W    Culture   Final    STAPHYLOCOCCUS AUREUS KLEBSIELLA  SPECIES SUSCEPTIBILITIES PERFORMED ON PREVIOUS CULTURE WITHIN THE LAST 5 DAYS.    Report Status 06/07/2015 FINAL  Final  Blood culture (routine x 2)     Status: None   Collection Time: 06/04/15 10:45 PM  Result Value Ref Range Status   Specimen Description BLOOD LEFT HAND  Final   Special Requests BOTTLES DRAWN AEROBIC AND ANAEROBIC 5CC  Final   Culture  Setup Time   Final    GRAM POSITIVE COCCI IN CLUSTERS IN BOTH AEROBIC AND ANAEROBIC BOTTLES CRITICAL RESULT CALLED TO, READ BACK BY AND VERIFIED WITH: K MOON 06/05/15 @ 1447 M VESTAL GRAM NEGATIVE RODS ANAEROBIC BOTTLE ONLY CRITICAL RESULT CALLED TO, READ BACK BY AND VERIFIED WITH: RN Tilden Fossa AT [id number] MARTINB CONFIRMED BY VINCE W    Culture STAPHYLOCOCCUS AUREUS KLEBSIELLA SPECIES   Final   Report Status 06/07/2015 FINAL  Final   Organism ID, Bacteria STAPHYLOCOCCUS AUREUS  Final   Organism ID, Bacteria KLEBSIELLA SPECIES  Final      Susceptibility   Staphylococcus aureus - MIC*    CIPROFLOXACIN <=0.5 SENSITIVE Sensitive     ERYTHROMYCIN <=0.25 SENSITIVE Sensitive     GENTAMICIN <=0.5 SENSITIVE Sensitive     OXACILLIN <=0.25 SENSITIVE Sensitive     TETRACYCLINE <=1 SENSITIVE Sensitive     VANCOMYCIN <=0.5 SENSITIVE Sensitive     TRIMETH/SULFA <=10 SENSITIVE Sensitive  CLINDAMYCIN <=0.25 SENSITIVE Sensitive     RIFAMPIN <=0.5 SENSITIVE Sensitive     Inducible Clindamycin NEGATIVE Sensitive     * STAPHYLOCOCCUS AUREUS   Klebsiella species - MIC*    AMPICILLIN >=32 RESISTANT Resistant     CEFAZOLIN >=64 RESISTANT Resistant     CEFEPIME <=1 SENSITIVE Sensitive     CEFTAZIDIME <=1 SENSITIVE Sensitive     CEFTRIAXONE <=1 SENSITIVE Sensitive     CIPROFLOXACIN <=0.25 SENSITIVE Sensitive     GENTAMICIN <=1 SENSITIVE Sensitive     IMIPENEM <=0.25 SENSITIVE Sensitive     TRIMETH/SULFA <=20 SENSITIVE Sensitive     AMPICILLIN/SULBACTAM 4 SENSITIVE Sensitive     PIP/TAZO <=4 SENSITIVE Sensitive     * KLEBSIELLA SPECIES   MRSA PCR Screening     Status: None   Collection Time: 06/05/15  5:51 PM  Result Value Ref Range Status   MRSA by PCR NEGATIVE NEGATIVE Final    Comment:        The GeneXpert MRSA Assay (FDA approved for NASAL specimens only), is one component of a comprehensive MRSA colonization surveillance program. It is not intended to diagnose MRSA infection nor to guide or monitor treatment for MRSA infections.   Culture, blood (routine x 2)     Status: None (Preliminary result)   Collection Time: 06/05/15  6:06 PM  Result Value Ref Range Status   Specimen Description BLOOD LEFT ANTECUBITAL  Final   Special Requests BOTTLES DRAWN AEROBIC AND ANAEROBIC 5CC  Final   Culture NO GROWTH 4 DAYS  Final   Report Status PENDING  Incomplete  Culture, blood (routine x 2)     Status: None (Preliminary result)   Collection Time: 06/05/15  6:08 PM  Result Value Ref Range Status   Specimen Description BLOOD LEFT HAND  Final   Special Requests BOTTLES DRAWN AEROBIC AND ANAEROBIC 5CC  Final   Culture NO GROWTH 4 DAYS  Final   Report Status PENDING  Incomplete    Impression/Plan:  1. MSSA bacteremia - Now on ceftriaxone.  Repeat blood cultures remain negative.  To get tunneled catheter.  2. Discitis/osteomyelitis - will need prolonged IV antibiotics. Treat with IV ceftriaxone 2 grams once daily for 6 weeks through April 24th. Will need weekly cbc, cmp to [facility] 3. dispo - apparently to rehab  We will arrange follow up in our clinic in about 4-5 weeks  I will sign off, call with questions

## 2015-06-10 NOTE — Progress Notes (Signed)
OT Cancellation Note  Patient Details Name: Katrina Peck MRN: [id number] DOB: [birth year]   Cancelled Treatment:    Reason Eval/Treat Not Completed: Patient at procedure or test/ unavailable (cath)  Felecia Shelling   OTR/L Pager: [phone] Office: [phone] .  06/10/2015, 11:54 AM

## 2015-06-10 NOTE — Progress Notes (Signed)
Physical Therapy Treatment Patient Details Name: Katrina Peck MRN: [id number] DOB: [birth year] Today's Date: 06/10/2015    History of Present Illness 70 yo female admitted with severe back pain workup underway to r/o discitis and sepsis.  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  PMH: anemia, HTN, chronic systolic CHF, NICM pericardial effusion    PT Comments    Patient making slow progress with mobility - improved bed mobility and sitting balance/tolerance today. Encouraged patient to keep pain under better control.  Follow Up Recommendations  CIR     Equipment Recommendations  Rolling walker with 5" wheels;3in1 (PT)    Recommendations for Other Services       Precautions / Restrictions Precautions Precautions: Fall Restrictions Weight Bearing Restrictions: No    Mobility  Bed Mobility Overal bed mobility: Needs Assistance Bed Mobility: Supine to Sit;Sit to Supine     Supine to sit: Supervision;HOB elevated Sit to supine: Min assist;HOB elevated   General bed mobility comments: Patient moving LE's without use of UE's to move to EOB.  Increased time required.  Patient sat EOB x 16 minutes.  Able to perform exercises in sitting.  Returned to bed with min assist.  Transfers                 General transfer comment: Patient declined due to back pain.  Encouraged patient to take her pain meds to manage pain better, allowing for incerased mobility. (Patient declining pain meds per RN)  Ambulation/Gait                 Stairs            Wheelchair Mobility    Modified Rankin (Stroke Patients Only)       Balance Overall balance assessment: Needs assistance Sitting-balance support: No upper extremity supported;Feet supported Sitting balance-Leahy Scale: Good                              Cognition Arousal/Alertness: Awake/alert Behavior During Therapy: WFL for tasks assessed/performed Overall Cognitive Status: Within  Functional Limits for tasks assessed                      Exercises General Exercises - Upper Extremity Shoulder Flexion: AROM;Both;5 reps;Seated General Exercises - Lower Extremity Ankle Circles/Pumps: AROM;Both;15 reps;Seated Long Arc Quad: AROM;Both;10 reps;Seated Hip ABduction/ADduction: AROM;Both;10 reps;Seated Hip Flexion/Marching: AROM;Both;10 reps;Seated    General Comments        Pertinent Vitals/Pain Pain Assessment: 0-10 Pain Score: 10-Worst pain ever Pain Location: back Pain Descriptors / Indicators: Spasm;Tightness;Constant;Aching Pain Intervention(s): Limited activity within patient's tolerance;Monitored during session;Premedicated before session;Repositioned    Home Living                      Prior Function            PT Goals (current goals can now be found in the care plan section) Acute Rehab PT Goals Patient Stated Goal: to be able to do for myself Progress towards PT goals: Progressing toward goals (Making slow progress)    Frequency  Min 3X/week    PT Plan Current plan remains appropriate    Co-evaluation             End of Session   Activity Tolerance: Patient limited by pain Patient left: in bed;with call bell/phone within reach     Time: 5465-0354 PT Time Calculation (min) (ACUTE ONLY): 29  min  Charges:  $Therapeutic Activity: 23-37 mins                    G Codes:      Katrina Peck 07-04-15, 4:15 PM Durenda Hurt. Renaldo Fiddler, [facility] Acute Rehab Services Pager [phone]

## 2015-06-10 NOTE — Progress Notes (Signed)
ANTICOAGULATION CONSULT NOTE - Follow Up Consult  Pharmacy Consult for warfarin Indication: atrial fibrillation and history of LV thrombus  No Known Allergies  Patient Measurements: Height: 5\' 2"  (157.5 cm) Weight: 112 lb 14 oz (51.2 kg) IBW/kg (Calculated) : 50.1 Heparin Dosing Weight:46  Vital Signs: Temp: 97.9 F (36.6 C) (03/17 0400) Temp Source: Oral (03/17 0400) BP: 97/67 mmHg (03/17 0600) Pulse Rate: 65 (03/17 0600)  Labs:  Recent Labs  06/08/15 0320 06/09/15 0300 06/10/15 0440  HGB 11.5* 10.4* 10.4*  HCT 35.1* 33.4* 33.3*  PLT 266 291 293  LABPROT 24.8* 27.2* 23.9*  INR 2.27* 2.57* 2.16*  CREATININE 0.92 0.82 0.92    Estimated Creatinine Clearance: 46.3 mL/min (by C-G formula based on Cr of 0.92).  Assessment: 70 year old female with afib on chronic coumadin and history of LV thrombus. Patient missed evening dose of warfarin 3/15 but received 6mg  yesterday. INR 2.16 and has been therapeutic on home regimen. RN reports no s/s of bleeding. CBC stable.  PTA warfarin dose: 6mg  daily except for 4mg  on M/W/F  Goal of Therapy:  INR 2-3 Heparin level 0.3-0.7 units/ml Monitor platelets by anticoagulation protocol: Yes   Plan:  Warfarin 4 mg *1 tonight Daily CBC and INR Monitor for signs/symptoms of bleeding   Sherron Monday, PharmD Clinical Pharmacy Resident Pager: [phone] 06/10/2015 7:30 AM

## 2015-06-10 NOTE — Progress Notes (Signed)
PATIENT DETAILS Name: Katrina Peck Age: 70 y.o. Sex: female Date of Birth: Mar 02, 1946 Admit Date: 06/04/2015 Admitting Physician Lorretta Harp, MD ZOX:[id number], MD   Brief narrative: 70 year old female with history of chronic systolic heart failure-on milrinone infusion at home via a tunneled catheter, presented to the hospital with severe lower back pain. Found to have significant leukocytosis. 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  Subjective: Continues to have back pain although better than the past few days. Shortness of breath much improved.  Assessment/Plan: Principal Problem: Sepsis due to MSSA and Klebsiella  bacteremia along with T11/T12 discitis:Secondary to catheter related blood stream infection. Tunneled catheter removed by IR on 3/13.  MRI entire spine with gen anesthesia/sedation showed T11/T12 discitis. TEE 3/13 without any vegetations. Repeat blood cultures negative, we will place order for reinsertion of midline catheter.Continue Rocephin, await further input from infectious disease regarding duration for antimicrobial therapy.  Active Problems: Acute on chronic systolic heart failure (EF 20% by TTE on December 2016): Hx of Nonischemic cardiomyopathy.LHC(4/16) with no significant coronary disease. Continues to have mild overload on exam- weight unfortunately increased again, CHF team following, on IV Lasix. Continue milrinone and digoxin.  History of LV thrombus: On chronic Coumadin. Pharmacy dosing-INR therapeutic.  Paroxysmal atrial fibrillation: Rate controlled with amiodarone, on chronic Coumadin.CHADS2VASC 5.  Chronic pericardial effusion: s/p window 07/26/14-cytology was negative. CT abdomen done on admission shows a large pericardial effusion-however TEE 3/13 showed moderate pericardial effusion-no tamponade  Pul YNW:GNFAOZ  secondary to LV failure  Nodular density in left mid lung on chest x-ray: CT chest confirmed a small nodule in the left major fissure, recommendations from radiology are to repeat a CT chest in 6 months.  Protein-calorie malnutrition, severe:nutriton evaluation  Disposition: Remain inpatient-CIR vs SNF when midline PICC has been placed.  Antimicrobial agents  See below  Anti-infectives    Start     Dose/Rate Route Frequency Ordered Stop   06/09/15 1500  cefTRIAXone (ROCEPHIN) 2 g in dextrose 5 % 50 mL IVPB     2 g 100 mL/hr over 30 Minutes Intravenous Every 12 hours 06/09/15 1431     06/07/15 1600  Ampicillin-Sulbactam (UNASYN) 3 g in sodium chloride 0.9 % 100 mL IVPB  Status:  Discontinued     3 g 100 mL/hr over 60 Minutes Intravenous Every 6 hours 06/07/15 1455 06/09/15 1431   06/07/15 0813  vancomycin (VANCOCIN) IVPB 750 mg/150 ml premix     750 mg 150 mL/hr over 60 Minutes Intravenous Every 24 hours 06/07/15 0813 06/07/15 1000   06/06/15 0400  vancomycin (VANCOCIN) IVPB 750 mg/150 ml premix  Status:  Discontinued     750 mg 150 mL/hr over 60 Minutes Intravenous Every 24 hours 06/05/15 0302 06/07/15 0813   06/05/15 1400  piperacillin-tazobactam (ZOSYN) IVPB 3.375 g  Status:  Discontinued     3.375 g 12.5 mL/hr over 240 Minutes Intravenous 3 times per day 06/05/15 0302 06/05/15 1202   06/05/15 1215  cefTRIAXone (ROCEPHIN) 2 g in dextrose 5 % 50 mL IVPB  Status:  Discontinued     2 g 100 mL/hr over 30 Minutes Intravenous Every 24 hours 06/05/15 1202 06/07/15 1422   06/05/15 0315  piperacillin-tazobactam (ZOSYN) IVPB 3.375 g     3.375 g 100 mL/hr over 30 Minutes Intravenous STAT 06/05/15 0302 06/05/15 0610   06/05/15 0315  vancomycin (VANCOCIN) IVPB 1000 mg/200 mL premix     1,000 mg 200 mL/hr over 60 Minutes Intravenous NOW 06/05/15 0302 06/05/15 0533      DVT Prophylaxis: Coumadin  Code Status: Full code   Family Communication None at  bedside  Procedures: None  CONSULTS:  cardiology and ID  Time spent 30 minutes-Greater than 50% of this time was spent in counseling, explanation of diagnosis, planning of further management, and coordination of care.  MEDICATIONS: Scheduled Meds: . amiodarone  100 mg Oral Daily  . cefTRIAXone (ROCEPHIN)  IV  2 g Intravenous Q12H  . digoxin  0.0625 mg Oral Daily  . feeding supplement  1 Container Oral TID BM  . furosemide  40 mg Intravenous BID  . lidocaine      . magnesium oxide  200 mg Oral BID  . polyethylene glycol  17 g Oral Daily  . sodium chloride flush  3 mL Intravenous Q12H  . spironolactone  12.5 mg Oral Daily  . warfarin  4 mg Oral ONCE-1800  . Warfarin - Pharmacist Dosing Inpatient   Does not apply q1800   Continuous Infusions: . milrinone 0.25 mcg/kg/min (06/10/15 0900)   PRN Meds:.acetaminophen **OR** acetaminophen, HYDROmorphone (DILAUDID) injection, methocarbamol, morphine injection, oxyCODONE-acetaminophen, sodium chloride flush    PHYSICAL EXAM: Vital signs in last 24 hours: Filed Vitals:   06/10/15 0600 06/10/15 0800 06/10/15 0900 06/10/15 1048  BP: 97/67 108/74    Pulse: 65 74  73  Temp:   98.3 F (36.8 C)   TempSrc:   Oral   Resp:      Height:      Weight:      SpO2: 98% 98%      Weight change: 3.9 kg (8 lb 9.6 oz) Filed Weights   06/08/15 0431 06/09/15 0459 06/10/15 0500  Weight: 44.4 kg (97 lb 14.2 oz) 47.3 kg (104 lb 4.4 oz) 51.2 kg (112 lb 14 oz)   Body mass index is 20.64 kg/(m^2).   Gen Exam: Awake and alert with clear speech.   Neck: Supple Chest: Clear bilaterally CVS: S1 S2 Regular Abdomen: soft, BS +, non tender, non distended.  Extremities: trace edema, lower extremities warm to touch,+chronic venous stasis changes Neurologic:gen weakness-but not focal Skin: No Rash.   Wounds: N/A.    Intake/Output from previous day:  Intake/Output Summary (Last 24 hours) at 06/10/15 1233 Last data filed at 06/10/15 0900  Gross per  24 hour  Intake    837 ml  Output   1650 ml  Net   -813 ml     LAB RESULTS: CBC  Recent Labs Lab 06/06/15 0424 06/07/15 0245 06/08/15 0320 06/09/15 0300 06/10/15 0440  WBC 19.9* 16.8* 13.0* 12.2* 8.9  HGB 10.1* 10.8* 11.5* 10.4* 10.4*  HCT 32.0* 34.6* 35.1* 33.4* 33.3*  PLT 225 266 266 291 293  MCV 84.4 84.4 84.6 85.2 84.9  MCH 26.6 26.3 27.7 26.5 26.5  MCHC 31.6 31.2 32.8 31.1 31.2  RDW 15.6* 15.2 15.1 15.2 15.1    Chemistries   Recent Labs Lab 06/06/15 0424 06/07/15 0245 06/08/15 0320 06/09/15 0300 06/10/15 0440  NA 134* 138 137 138 136  K 3.8 3.7 4.6 3.8 4.0  CL 101 101 100* 101 98*  CO2 GLUCOSE 116* 101* 100* 112* 110*  BUN CREATININE 0.92 0.91 0.92 0.82 0.92  CALCIUM 8.8* 8.7* 8.8* 8.6* 8.9    CBG:  Recent Labs Lab 06/06/15  0825 06/07/15 0809 06/08/15 0847 06/09/15 0726  GLUCAP 118* 96 97 118*    GFR Estimated Creatinine Clearance: 46.3 mL/min (by C-G formula based on Cr of 0.92).  Coagulation profile  Recent Labs Lab 06/06/15 0817 06/07/15 0627 06/08/15 0320 06/09/15 0300 06/10/15 0440  INR 1.81* 2.50* 2.27* 2.57* 2.16*    Cardiac Enzymes No results for input(s): CKMB, TROPONINI, MYOGLOBIN in the last 168 hours.  Invalid input(s): CK  Invalid input(s): POCBNP No results for input(s): DDIMER in the last 72 hours. No results for input(s): HGBA1C in the last 72 hours. No results for input(s): CHOL, HDL, LDLCALC, TRIG, CHOLHDL, LDLDIRECT in the last 72 hours. No results for input(s): TSH, T4TOTAL, T3FREE, THYROIDAB in the last 72 hours.  Invalid input(s): FREET3 No results for input(s): VITAMINB12, FOLATE, FERRITIN, TIBC, IRON, RETICCTPCT in the last 72 hours. No results for input(s): LIPASE, AMYLASE in the last 72 hours.  Urine Studies No results for input(s): UHGB, CRYS in the last 72 hours.  Invalid input(s): UACOL, UAPR, USPG, UPH, UTP, UGL, UKET, UBIL, UNIT, UROB, ULEU, UEPI, UWBC, URBC,  UBAC, CAST, UCOM, BILUA  MICROBIOLOGY: Recent Results (from the past 240 hour(s))  Blood culture (routine x 2)     Status: None   Collection Time: 06/04/15 10:40 PM  Result Value Ref Range Status   Specimen Description BLOOD LEFT ANTECUBITAL  Final   Special Requests BOTTLES DRAWN AEROBIC AND ANAEROBIC 5CC  Final   Culture  Setup Time   Final    GRAM POSITIVE COCCI IN CLUSTERS IN BOTH AEROBIC AND ANAEROBIC BOTTLES CRITICAL RESULT CALLED TO, READ BACK BY AND VERIFIED WITH: K MOON 06/05/15 @ 1447 M VESTAL GRAM NEGATIVE RODS ANAEROBIC BOTTLE ONLY CRITICAL RESULT CALLED TO, READ BACK BY AND VERIFIED WITH: RN M LESSARD AT 1741 [id number] MARTINB CONFIRMED BY VINCE W    Culture   Final    STAPHYLOCOCCUS AUREUS KLEBSIELLA SPECIES SUSCEPTIBILITIES PERFORMED ON PREVIOUS CULTURE WITHIN THE LAST 5 DAYS.    Report Status 06/07/2015 FINAL  Final  Blood culture (routine x 2)     Status: None   Collection Time: 06/04/15 10:45 PM  Result Value Ref Range Status   Specimen Description BLOOD LEFT HAND  Final   Special Requests BOTTLES DRAWN AEROBIC AND ANAEROBIC 5CC  Final   Culture  Setup Time   Final    GRAM POSITIVE COCCI IN CLUSTERS IN BOTH AEROBIC AND ANAEROBIC BOTTLES CRITICAL RESULT CALLED TO, READ BACK BY AND VERIFIED WITH: K MOON 06/05/15 @ 1447 M VESTAL GRAM NEGATIVE RODS ANAEROBIC BOTTLE ONLY CRITICAL RESULT CALLED TO, READ BACK BY AND VERIFIED WITH: RN Tilden Fossa AT 1741 [id number] MARTINB CONFIRMED BY VINCE W    Culture STAPHYLOCOCCUS AUREUS KLEBSIELLA SPECIES   Final   Report Status 06/07/2015 FINAL  Final   Organism ID, Bacteria STAPHYLOCOCCUS AUREUS  Final   Organism ID, Bacteria KLEBSIELLA SPECIES  Final      Susceptibility   Staphylococcus aureus - MIC*    CIPROFLOXACIN <=0.5 SENSITIVE Sensitive     ERYTHROMYCIN <=0.25 SENSITIVE Sensitive     GENTAMICIN <=0.5 SENSITIVE Sensitive     OXACILLIN <=0.25 SENSITIVE Sensitive     TETRACYCLINE <=1 SENSITIVE Sensitive     VANCOMYCIN  <=0.5 SENSITIVE Sensitive     TRIMETH/SULFA <=10 SENSITIVE Sensitive     CLINDAMYCIN <=0.25 SENSITIVE Sensitive     RIFAMPIN <=0.5 SENSITIVE Sensitive     Inducible Clindamycin NEGATIVE Sensitive     * STAPHYLOCOCCUS AUREUS   Klebsiella  species - MIC*    AMPICILLIN >=32 RESISTANT Resistant     CEFAZOLIN >=64 RESISTANT Resistant     CEFEPIME <=1 SENSITIVE Sensitive     CEFTAZIDIME <=1 SENSITIVE Sensitive     CEFTRIAXONE <=1 SENSITIVE Sensitive     CIPROFLOXACIN <=0.25 SENSITIVE Sensitive     GENTAMICIN <=1 SENSITIVE Sensitive     IMIPENEM <=0.25 SENSITIVE Sensitive     TRIMETH/SULFA <=20 SENSITIVE Sensitive     AMPICILLIN/SULBACTAM 4 SENSITIVE Sensitive     PIP/TAZO <=4 SENSITIVE Sensitive     * KLEBSIELLA SPECIES  MRSA PCR Screening     Status: None   Collection Time: 06/05/15  5:51 PM  Result Value Ref Range Status   MRSA by PCR NEGATIVE NEGATIVE Final    Comment:        The GeneXpert MRSA Assay (FDA approved for NASAL specimens only), is one component of a comprehensive MRSA colonization surveillance program. It is not intended to diagnose MRSA infection nor to guide or monitor treatment for MRSA infections.   Culture, blood (routine x 2)     Status: None (Preliminary result)   Collection Time: 06/05/15  6:06 PM  Result Value Ref Range Status   Specimen Description BLOOD LEFT ANTECUBITAL  Final   Special Requests BOTTLES DRAWN AEROBIC AND ANAEROBIC 5CC  Final   Culture NO GROWTH 4 DAYS  Final   Report Status PENDING  Incomplete  Culture, blood (routine x 2)     Status: None (Preliminary result)   Collection Time: 06/05/15  6:08 PM  Result Value Ref Range Status   Specimen Description BLOOD LEFT HAND  Final   Special Requests BOTTLES DRAWN AEROBIC AND ANAEROBIC 5CC  Final   Culture NO GROWTH 4 DAYS  Final   Report Status PENDING  Incomplete    RADIOLOGY STUDIES/RESULTS: Ct Chest Wo Contrast  06/09/2015  CLINICAL DATA:  Lung nodule. Hx HTN, CHF, NICM, and  pericardial effusion. EXAM: CT CHEST WITHOUT CONTRAST TECHNIQUE: Multidetector CT imaging of the chest was performed following the standard protocol without IV contrast. COMPARISON:  06/04/2015, 07/24/2014 FINDINGS: Heart: There is a large pericardial effusion, measuring 3.5 cm adjacent to the right atrium. Aortic valve calcification. No significant coronary artery calcification. Vascular structures: There is mild atherosclerotic calcification of the thoracic aorta. No associated aneurysm. Pulmonary arteries have a normal noncontrast appearance. Mediastinum/thyroid: The visualized portion of the thyroid gland has a normal appearance. No mediastinal, hilar, or axillary adenopathy. Lungs/Airways: There are bilateral pleural effusions, right greater than left. Associated bilateral lower lobe infiltrates are also noted, right greater than left. Bibasilar calcifications are noted in the lung parenchyma, of indeterminate significance and stable since prior study. Adjacent to the left major fissure there is a small opacity which measures 8 x 5 mm and contains a central lucency. Upper abdomen: There are parenchymal calcifications within the kidneys bilaterally. Chest wall/osseous structures: Midthoracic spondylosis. IMPRESSION: 1. Large pericardial effusion. 2. Small nodule within the left major fissure, with diameter of 7 mm. This is favored be benign, possibly related to pleural effusion, a pseudo tumor. Follow-up CT of the chest is recommended in 6 months to document resolution. 3. Small bilateral pleural effusions, right greater than left. 4. Right greater than left lower lobe consolidation. Electronically Signed   By: Norva Pavlov M.D.   On: 06/09/2015 14:00   Mr Cervical Spine Wo Contrast  06/08/2015  CLINICAL DATA:  Back pain, worsening recently. EXAM: MRI CERVICAL SPINE WITHOUT CONTRAST TECHNIQUE: Multiplanar, multisequence MR imaging of  the cervical spine was performed. No intravenous contrast was  administered. COMPARISON:  None. FINDINGS: Foramen magnum is widely patent. C1-2 is normal. 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 C3-4: Spondylosis with endplate osteophytes and bulging of the disc. Facet arthropathy left worse than right with some edema of the facet joint on the left. Narrowing of the subarachnoid space surrounding the cord. AP diameter of the canal only 7 mm at this level. Mild deformity of the cord. Foraminal stenosis bilaterally because of osteophytic encroachment, left worse than right because of the facet arthropathy. C4-5: Spondylosis with endplate osteophytes and bulging of the disc. Effacement of the subarachnoid space surrounding the cord with slight cord deformity. AP diameter of the canal only 7 mm. Foraminal stenosis bilaterally because of osteophytic encroachment. C5-6: Spondylosis with endplate osteophytes and bulging disc material more pronounced towards the right. Effacement of the subarachnoid space surrounding the cord. AP diameter of the canal is 9 mm. Foraminal stenosis on the right that could compress the C6 nerve root. C6-7: Spondylosis with endplate osteophytes and bulging of the disc. Narrowing of the subarachnoid space surrounding the cord. AP diameter of the canal measures 9 mm. Foraminal stenosis right worse than left that could cause neural compression. C7-T1: Spondylosis with endplate osteophytes and bulging of the disc. No compressive narrowing of the central canal. Mild foraminal narrowing on the right and moderate foraminal narrowing on the left. No abnormal cord edema. 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 Canal stenosis at C3-4 and C4-5 with AP diameter of canal only 7 mm at those levels. Pronounced facet arthropathy on the left at C3-4 with edema. This could be a cause of left-sided neck pain. Foraminal stenosis on the left at C3-4 could cause neural compression. Foraminal  stenosis bilaterally at C4-5 could cause neural compression. Spondylosis at C5-6 and C6-7 with canal narrowing to a diameter of 9 mm. Foraminal stenosis bilaterally at those levels that could compress either or both C6 or C7 nerve roots. Foraminal narrowing on the left at C7-T1 that could be symptomatic. Electronically Signed   By: Paulina Fusi M.D.   On: 06/08/2015 18:35   Mr Lumbar Spine Wo Contrast  06/08/2015  CLINICAL DATA:  Back pain, acute worsening. EXAM: MRI LUMBAR SPINE WITHOUT CONTRAST TECHNIQUE: Multiplanar, multisequence MR imaging of the lumbar spine was performed. No intravenous contrast was administered. COMPARISON:  CT abdomen 06/05/2015 FINDINGS: Sagittal STIR imaging was performed. We are asked to abbreviated the study by the clinical service. There are non-compressive disc bulges at L2-3, L3-4, L4-5 and L5-S1. There is facet arthropathy at L4-5 and L5-S1 with 2 mm of anterolisthesis at L4-5. There is not appear to be any compressive stenosis of the central canal. Lower lumbar facet arthropathy could contribute to back pain, but this would likely be chronic. 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 The facet arthropathy could be associated with low back pain. Electronically Signed   By: Paulina Fusi M.D.   On: 06/08/2015 18:37   Mr Thoracic Spine W Wo Contrast  06/08/2015  CLINICAL DATA:  Severe back pain with acute worsening. EXAM: MRI THORACIC SPINE WITHOUT AND WITH CONTRAST TECHNIQUE: Multiplanar and multiecho pulse sequences of the thoracic spine were obtained without and with intravenous contrast. CONTRAST:  38mL MULTIHANCE GADOBENATE DIMEGLUMINE 529 MG/ML IV SOLN COMPARISON:  None. FINDINGS: T1-2:  Normal. T2-3:  Bulging of the disc.  No compressive stenosis. T3-4 through T10-11: Normal. No disc pathology. No fracture or focal bone lesion. The patient does have a  layering pleural effusion on the right. T11-12: Spondyloarthropathy at this level. Mild edema within the disc space and of the adjacent endplates at T11 and T12. The surrounding soft tissues appear to show edematous change. This appearance could be due to single level degenerative spondyloarthropathy, but the possibility of discitis osteomyelitis certainly exists on the basis of the imaging. 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 Edema within the disc and within the T11 and T12 vertebral bodies. Surrounding edematous change in the paraspinous soft tissues. No evidence of epidural collection or neural compression. Sterile degenerative spondylosis can have this appearance, particularly in dialysis patients, but there is considerable concern for infection in this case. Electronically Signed   By: Paulina Fusi M.D.   On: 06/08/2015 18:42   Ct Abdomen Pelvis W Contrast  06/05/2015  CLINICAL DATA:  Back spasms.  Leukocytosis. EXAM: CT ABDOMEN AND PELVIS WITH CONTRAST TECHNIQUE: Multidetector CT imaging of the abdomen and pelvis was performed using the standard protocol following bolus administration of intravenous contrast. CONTRAST:  75mL OMNIPAQUE IOHEXOL 300 MG/ML  SOLN COMPARISON:  None. FINDINGS: Lower chest and abdominal wall: Chronic cardiomegaly. Remote subendocardial left ventricular infarct, greatest along the anterior wall. Chronic large pericardial effusion when compared to previous CT. Chronic small right pleural effusion with atelectasis or scar. Hepatobiliary: Liver enhancement pattern suggests passive congestion. No focal lesion.Cholelithiasis without evidence of acute cholecystitis. No duct enlargement Pancreas: Unremarkable. Spleen: Unremarkable. Adrenals/Urinary Tract: Negative adrenals. Bilateral renal cortical thinning. Small nonobstructive bilateral renal calculi. Renal sinus cysts. Chronic hemorrhagic or proteinaceous cyst in the upper pole left kidney  measuring 13 mm, also seen on April 2016 chest CT. Unremarkable decompressed bladder. Reproductive:Multiple uterine fibroids measuring up to 23 mm. Stomach/Bowel: No obstruction. Colonic diverticulosis without active inflammation. No evidence of appendicitis. Vascular/Lymphatic: No acute vascular abnormality. No mass or adenopathy. Peritoneal: Small ascites Musculoskeletal: No acute abnormalities. IMPRESSION: 1. No acute finding. 2. Remote left ventricular infarct with chronic cardiomegaly. Chronic large pericardial effusion and small right pleural effusion. 3. Passive congestion of the liver. 4. Cholelithiasis and nephrolithiasis. 5. Colonic diverticulosis. Electronically Signed   By: Marnee Spring M.D.   On: 06/05/2015 01:33   Dg Chest Port 1 View  06/04/2015  CLINICAL DATA:  Shortness of breath EXAM: PORTABLE CHEST 1 VIEW COMPARISON:  03/22/2015 chest radiograph. FINDINGS: Right internal jugular central venous catheter terminates at the cavoatrial junction. Stable cardiomediastinal silhouette with moderate cardiomegaly. No pneumothorax. Small bilateral pleural effusions. Mild pulmonary edema. Mild bibasilar atelectasis. There is a new subcentimeter nodular density in the peripheral left mid lung. IMPRESSION: 1. Moderate cardiomegaly and mild pulmonary edema, in keeping with mild congestive heart failure. 2. Small bilateral pleural effusions and mild bibasilar atelectasis. 3. New subcentimeter nodular density in the peripheral left mid lung, cannot exclude a pulmonary nodule. Recommend correlation with chest CT on a short term outpatient basis. Electronically Signed   By: Delbert Phenix M.D.   On: 06/04/2015 20:44    Jeoffrey Massed, MD  [facility] Pager:[phone]  If 7PM-7AM, please contact night-coverage [email or web address] Password [facility] 06/10/2015, 12:33 PM   LOS: 5 days

## 2015-06-11 LAB — BASIC METABOLIC PANEL
Anion gap: 10 (ref 5–15)
BUN: 15 mg/dL (ref 6–20)
CHLORIDE: 96 mmol/L — AB (ref 101–111)
CO2: 29 mmol/L (ref 22–32)
CREATININE: 0.75 mg/dL (ref 0.44–1.00)
Calcium: 9.1 mg/dL (ref 8.9–10.3)
GFR calc Af Amer: >60 mL/min (ref >60)
GFR calc non Af Amer: >60 mL/min (ref >60)
Glucose, Bld: 97 mg/dL (ref 65–99)
Potassium: 3.9 mmol/L (ref 3.5–5.1)
Sodium: 135 mmol/L (ref 135–145)

## 2015-06-11 LAB — CBC WITH DIFFERENTIAL/PLATELET
Basophils Absolute: 0 10*3/uL (ref 0.0–0.1)
Basophils Relative: 0 %
EOS ABS: 0.2 10*3/uL (ref 0.0–0.7)
EOS PCT: 2 %
HCT: 33.5 % — ABNORMAL LOW (ref 36.0–46.0)
Hemoglobin: 10.4 g/dL — ABNORMAL LOW (ref 12.0–15.0)
LYMPHS ABS: 1.4 10*3/uL (ref 0.7–4.0)
Lymphocytes Relative: 14 %
MCH: 26.3 pg (ref 26.0–34.0)
MCHC: 31 g/dL (ref 30.0–36.0)
MCV: 84.6 fL (ref 78.0–100.0)
MONO ABS: 0.7 10*3/uL (ref 0.1–1.0)
MONOS PCT: 7 %
Neutro Abs: 7.5 10*3/uL (ref 1.7–7.7)
Neutrophils Relative %: 77 %
PLATELETS: 338 10*3/uL (ref 150–400)
RBC: 3.96 MIL/uL (ref 3.87–5.11)
RDW: 14.9 % (ref 11.5–15.5)
WBC: 9.9 10*3/uL (ref 4.0–10.5)

## 2015-06-11 LAB — GLUCOSE, CAPILLARY: GLUCOSE-CAPILLARY: 85 mg/dL (ref 65–99)

## 2015-06-11 LAB — PROTIME-INR
INR: 2.04 — ABNORMAL HIGH (ref 0.00–1.49)
PROTHROMBIN TIME: 22.9 s — AB (ref 11.6–15.2)

## 2015-06-11 MED ORDER — TORSEMIDE 20 MG PO TABS
20.0000 mg | ORAL_TABLET | Freq: Every day | ORAL | Status: DC
  Administered 2015-06-11 – 2015-06-13 (×3): 20 mg via ORAL
  Filled 2015-06-11 (×3): qty 1

## 2015-06-11 MED ORDER — WARFARIN SODIUM 1 MG PO TABS
6.0000 mg | ORAL_TABLET | Freq: Once | ORAL | Status: AC
  Administered 2015-06-11: 6 mg via ORAL
  Filled 2015-06-11 (×2): qty 1

## 2015-06-11 NOTE — Progress Notes (Signed)
PATIENT DETAILS Name: Katrina Peck Age: 70 y.o. Sex: female Date of Birth: 02-08-1946 Admit Date: 06/04/2015 Admitting Physician Lorretta Harp, MD YNW:GNFAO,[id number], MD   Brief narrative: 70 year old female with history of chronic systolic heart failure-on milrinone infusion at home via a tunneled catheter, presented to the hospital with severe lower back pain. Found to have significant leukocytosis. 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 Seen by infectious disease, recommendations are to continue IV Rocephin until April 24. Plans are for either CIR or SNF on discharge  Subjective: Back pain continues to improve. Shortness of breath stable. Once Foley catheter removed on 3/19.  Assessment/Plan: Principal Problem: Sepsis due to MSSA and Klebsiella  bacteremia along with T11/T12 discitis:Secondary to catheter related blood stream infection. Tunneled catheter removed by IR on 3/13.  MRI entire spine with gen anesthesia/sedation showed T11/T12 discitis. TEE 3/13 without any vegetations. Repeat blood cultures negative, subsequently midline catheter reinserted.infectious disease recommending IV Rocephin until April 24. Needs weekly CBC and CMP faxed to infectious disease clinic when discharged.  Active Problems: Acute on chronic systolic heart failure (EF 20% by TTE on December 2016): Hx of Nonischemic cardiomyopathy.LHC(4/16) with no significant coronary disease. Continues to have mild overload on exam- weight unfortunately increased again, CHF team following, on IV Lasix. Continue milrinone and digoxin.  History of LV thrombus: On chronic Coumadin. Pharmacy dosing-INR therapeutic.  Paroxysmal atrial fibrillation: Rate controlled with amiodarone, on chronic Coumadin.CHADS2VASC 5.  Chronic pericardial effusion: s/p window 07/26/14-cytology was negative. CT abdomen  done on admission shows a large pericardial effusion-however TEE 3/13 showed moderate pericardial effusion-no tamponade  Pul ONG:EXBMWU secondary to LV failure  Nodular density in left mid lung on chest x-ray: CT chest confirmed a small nodule in the left major fissure, recommendations from radiology are to repeat a CT chest in 6 months.  Protein-calorie malnutrition, severe:nutriton evaluation  Disposition: Remain inpatient-CIR vs SNF .  Antimicrobial agents  See below  Anti-infectives    Start     Dose/Rate Route Frequency Ordered Stop   06/11/15 0200  cefTRIAXone (ROCEPHIN) 2 g in dextrose 5 % 50 mL IVPB     2 g 100 mL/hr over 30 Minutes Intravenous Every 24 hours 06/10/15 1429     06/09/15 1500  cefTRIAXone (ROCEPHIN) 2 g in dextrose 5 % 50 mL IVPB  Status:  Discontinued     2 g 100 mL/hr over 30 Minutes Intravenous Every 12 hours 06/09/15 1431 06/10/15 1429   06/07/15 1600  Ampicillin-Sulbactam (UNASYN) 3 g in sodium chloride 0.9 % 100 mL IVPB  Status:  Discontinued     3 g 100 mL/hr over 60 Minutes Intravenous Every 6 hours 06/07/15 1455 06/09/15 1431   06/07/15 0813  vancomycin (VANCOCIN) IVPB 750 mg/150 ml premix     750 mg 150 mL/hr over 60 Minutes Intravenous Every 24 hours 06/07/15 0813 06/07/15 1000   06/06/15 0400  vancomycin (VANCOCIN) IVPB 750 mg/150 ml premix  Status:  Discontinued     750 mg 150 mL/hr over 60 Minutes Intravenous Every 24 hours 06/05/15 0302 06/07/15 0813   06/05/15 1400  piperacillin-tazobactam (ZOSYN) IVPB 3.375 g  Status:  Discontinued     3.375 g 12.5 mL/hr over 240 Minutes Intravenous 3 times per day 06/05/15 0302 06/05/15 1202   06/05/15 1215  cefTRIAXone (ROCEPHIN) 2 g in dextrose 5 % 50 mL  IVPB  Status:  Discontinued     2 g 100 mL/hr over 30 Minutes Intravenous Every 24 hours 06/05/15 1202 06/07/15 1422   06/05/15 0315  piperacillin-tazobactam (ZOSYN) IVPB 3.375 g     3.375 g 100 mL/hr over 30 Minutes Intravenous STAT 06/05/15 0302  06/05/15 0610   06/05/15 0315  vancomycin (VANCOCIN) IVPB 1000 mg/200 mL premix     1,000 mg 200 mL/hr over 60 Minutes Intravenous NOW 06/05/15 0302 06/05/15 0533      DVT Prophylaxis: Coumadin  Code Status: Full code   Family Communication None at bedside  Procedures: None  CONSULTS:  cardiology and ID  Time spent 25 minutes-Greater than 50% of this time was spent in counseling, explanation of diagnosis, planning of further management, and coordination of care.  MEDICATIONS: Scheduled Meds: . amiodarone  100 mg Oral Daily  . cefTRIAXone (ROCEPHIN)  IV  2 g Intravenous Q24H  . digoxin  0.0625 mg Oral Daily  . feeding supplement  1 Container Oral TID BM  . furosemide  40 mg Intravenous BID  . magnesium oxide  200 mg Oral BID  . polyethylene glycol  17 g Oral Daily  . sodium chloride flush  3 mL Intravenous Q12H  . spironolactone  12.5 mg Oral Daily  . warfarin  6 mg Oral ONCE-1800  . Warfarin - Pharmacist Dosing Inpatient   Does not apply q1800   Continuous Infusions: . milrinone 0.25 mcg/kg/min (06/10/15 1702)   PRN Meds:.acetaminophen **OR** acetaminophen, HYDROmorphone (DILAUDID) injection, methocarbamol, morphine injection, oxyCODONE-acetaminophen, sodium chloride flush    PHYSICAL EXAM: Vital signs in last 24 hours: Filed Vitals:   06/11/15 0405 06/11/15 0500 06/11/15 0600 06/11/15 0755  BP: 109/69 102/68 101/67 107/70  Pulse:    67  Temp: 97.4 F (36.3 C)   98 F (36.7 C)  TempSrc: Oral   Oral  Resp: 18  16 18   Height:      Weight: 46.8 kg (103 lb 2.8 oz)     SpO2: 100%   100%    Weight change: -4.4 kg (-9 lb 11.2 oz) Filed Weights   06/09/15 0459 06/10/15 0500 06/11/15 0405  Weight: 47.3 kg (104 lb 4.4 oz) 51.2 kg (112 lb 14 oz) 46.8 kg (103 lb 2.8 oz)   Body mass index is 18.87 kg/(m^2).   Gen Exam: Awake and alert with clear speech.   Neck: Supple Chest: Clear bilaterally CVS: S1 S2 Regular Abdomen: soft, BS +, non tender, non  distended.  Extremities: trace edema, lower extremities warm to touch,+chronic venous stasis changes Neurologic:gen weakness-but not focal Skin: No Rash.   Wounds: N/A.    Intake/Output from previous day:  Intake/Output Summary (Last 24 hours) at 06/11/15 1119 Last data filed at 06/11/15 1100  Gross per 24 hour  Intake  726.5 ml  Output   1525 ml  Net -798.5 ml     LAB RESULTS: CBC  Recent Labs Lab 06/07/15 0245 06/08/15 0320 06/09/15 0300 06/10/15 0440 06/11/15 0519  WBC 16.8* 13.0* 12.2* 8.9 9.9  HGB 10.8* 11.5* 10.4* 10.4* 10.4*  HCT 34.6* 35.1* 33.4* 33.3* 33.5*  PLT 266 266 291 293 338  MCV 84.4 84.6 85.2 84.9 84.6  MCH 26.3 27.7 26.5 26.5 26.3  MCHC 31.2 32.8 31.1 31.2 31.0  RDW 15.2 15.1 15.2 15.1 14.9  LYMPHSABS  --   --   --   --  1.4  MONOABS  --   --   --   --  0.7  EOSABS  --   --   --   --  0.2  BASOSABS  --   --   --   --  0.0    Chemistries   Recent Labs Lab 06/07/15 0245 06/08/15 0320 06/09/15 0300 06/10/15 0440 06/11/15 0519  NA 138 137 138 136 135  K 3.7 4.6 3.8 4.0 3.9  CL 101 100* 101 98* 96*  CO2 26 25 26 31 29   GLUCOSE 101* 100* 112* 110* 97  BUN 16 17 14 13 15   CREATININE 0.91 0.92 0.82 0.92 0.75  CALCIUM 8.7* 8.8* 8.6* 8.9 9.1    CBG:  Recent Labs Lab 06/07/15 0809 06/08/15 0847 06/09/15 0726 06/10/15 0917 06/11/15 0752  GLUCAP 96 97 118* 84 85    GFR Estimated Creatinine Clearance: 49.7 mL/min (by C-G formula based on Cr of 0.75).  Coagulation profile  Recent Labs Lab 06/07/15 0627 06/08/15 0320 06/09/15 0300 06/10/15 0440 06/11/15 0519  INR 2.50* 2.27* 2.57* 2.16* 2.04*    Cardiac Enzymes No results for input(s): CKMB, TROPONINI, MYOGLOBIN in the last 168 hours.  Invalid input(s): CK  Invalid input(s): POCBNP No results for input(s): DDIMER in the last 72 hours. No results for input(s): HGBA1C in the last 72 hours. No results for input(s): CHOL, HDL, LDLCALC, TRIG, CHOLHDL, LDLDIRECT in the last  72 hours. No results for input(s): TSH, T4TOTAL, T3FREE, THYROIDAB in the last 72 hours.  Invalid input(s): FREET3 No results for input(s): VITAMINB12, FOLATE, FERRITIN, TIBC, IRON, RETICCTPCT in the last 72 hours. No results for input(s): LIPASE, AMYLASE in the last 72 hours.  Urine Studies No results for input(s): UHGB, CRYS in the last 72 hours.  Invalid input(s): UACOL, UAPR, USPG, UPH, UTP, UGL, UKET, UBIL, UNIT, UROB, ULEU, UEPI, UWBC, URBC, UBAC, CAST, UCOM, BILUA  MICROBIOLOGY: Recent Results (from the past 240 hour(s))  Blood culture (routine x 2)     Status: None   Collection Time: 06/04/15 10:40 PM  Result Value Ref Range Status   Specimen Description BLOOD LEFT ANTECUBITAL  Final   Special Requests BOTTLES DRAWN AEROBIC AND ANAEROBIC 5CC  Final   Culture  Setup Time   Final    GRAM POSITIVE COCCI IN CLUSTERS IN BOTH AEROBIC AND ANAEROBIC BOTTLES CRITICAL RESULT CALLED TO, READ BACK BY AND VERIFIED WITH: K MOON 06/05/15 @ 1447 M VESTAL GRAM NEGATIVE RODS ANAEROBIC BOTTLE ONLY CRITICAL RESULT CALLED TO, READ BACK BY AND VERIFIED WITH: RN M LESSARD AT 1741 [id number] MARTINB CONFIRMED BY VINCE W    Culture   Final    STAPHYLOCOCCUS AUREUS KLEBSIELLA SPECIES SUSCEPTIBILITIES PERFORMED ON PREVIOUS CULTURE WITHIN THE LAST 5 DAYS.    Report Status 06/07/2015 FINAL  Final  Blood culture (routine x 2)     Status: None   Collection Time: 06/04/15 10:45 PM  Result Value Ref Range Status   Specimen Description BLOOD LEFT HAND  Final   Special Requests BOTTLES DRAWN AEROBIC AND ANAEROBIC 5CC  Final   Culture  Setup Time   Final    GRAM POSITIVE COCCI IN CLUSTERS IN BOTH AEROBIC AND ANAEROBIC BOTTLES CRITICAL RESULT CALLED TO, READ BACK BY AND VERIFIED WITH: K MOON 06/05/15 @ 1447 M VESTAL GRAM NEGATIVE RODS ANAEROBIC BOTTLE ONLY CRITICAL RESULT CALLED TO, READ BACK BY AND VERIFIED WITH: RN Judie Petit [facility] AT 1741 [id number] MARTINB CONFIRMED BY VINCE W    Culture STAPHYLOCOCCUS  AUREUS KLEBSIELLA SPECIES   Final   Report Status 06/07/2015 FINAL  Final   Organism  ID, Bacteria STAPHYLOCOCCUS AUREUS  Final   Organism ID, Bacteria KLEBSIELLA SPECIES  Final      Susceptibility   Staphylococcus aureus - MIC*    CIPROFLOXACIN <=0.5 SENSITIVE Sensitive     ERYTHROMYCIN <=0.25 SENSITIVE Sensitive     GENTAMICIN <=0.5 SENSITIVE Sensitive     OXACILLIN <=0.25 SENSITIVE Sensitive     TETRACYCLINE <=1 SENSITIVE Sensitive     VANCOMYCIN <=0.5 SENSITIVE Sensitive     TRIMETH/SULFA <=10 SENSITIVE Sensitive     CLINDAMYCIN <=0.25 SENSITIVE Sensitive     RIFAMPIN <=0.5 SENSITIVE Sensitive     Inducible Clindamycin NEGATIVE Sensitive     * STAPHYLOCOCCUS AUREUS   Klebsiella species - MIC*    AMPICILLIN >=32 RESISTANT Resistant     CEFAZOLIN >=64 RESISTANT Resistant     CEFEPIME <=1 SENSITIVE Sensitive     CEFTAZIDIME <=1 SENSITIVE Sensitive     CEFTRIAXONE <=1 SENSITIVE Sensitive     CIPROFLOXACIN <=0.25 SENSITIVE Sensitive     GENTAMICIN <=1 SENSITIVE Sensitive     IMIPENEM <=0.25 SENSITIVE Sensitive     TRIMETH/SULFA <=20 SENSITIVE Sensitive     AMPICILLIN/SULBACTAM 4 SENSITIVE Sensitive     PIP/TAZO <=4 SENSITIVE Sensitive     * KLEBSIELLA SPECIES  MRSA PCR Screening     Status: None   Collection Time: 06/05/15  5:51 PM  Result Value Ref Range Status   MRSA by PCR NEGATIVE NEGATIVE Final    Comment:        The GeneXpert MRSA Assay (FDA approved for NASAL specimens only), is one component of a comprehensive MRSA colonization surveillance program. It is not intended to diagnose MRSA infection nor to guide or monitor treatment for MRSA infections.   Culture, blood (routine x 2)     Status: None   Collection Time: 06/05/15  6:06 PM  Result Value Ref Range Status   Specimen Description BLOOD LEFT ANTECUBITAL  Final   Special Requests BOTTLES DRAWN AEROBIC AND ANAEROBIC 5CC  Final   Culture NO GROWTH 5 DAYS  Final   Report Status 06/10/2015 FINAL  Final    Culture, blood (routine x 2)     Status: None   Collection Time: 06/05/15  6:08 PM  Result Value Ref Range Status   Specimen Description BLOOD LEFT HAND  Final   Special Requests BOTTLES DRAWN AEROBIC AND ANAEROBIC 5CC  Final   Culture NO GROWTH 5 DAYS  Final   Report Status 06/10/2015 FINAL  Final    RADIOLOGY STUDIES/RESULTS: Ct Chest Wo Contrast  06/09/2015  CLINICAL DATA:  Lung nodule. Hx HTN, CHF, NICM, and pericardial effusion. EXAM: CT CHEST WITHOUT CONTRAST TECHNIQUE: Multidetector CT imaging of the chest was performed following the standard protocol without IV contrast. COMPARISON:  06/04/2015, 07/24/2014 FINDINGS: Heart: There is a large pericardial effusion, measuring 3.5 cm adjacent to the right atrium. Aortic valve calcification. No significant coronary artery calcification. Vascular structures: There is mild atherosclerotic calcification of the thoracic aorta. No associated aneurysm. Pulmonary arteries have a normal noncontrast appearance. Mediastinum/thyroid: The visualized portion of the thyroid gland has a normal appearance. No mediastinal, hilar, or axillary adenopathy. Lungs/Airways: There are bilateral pleural effusions, right greater than left. Associated bilateral lower lobe infiltrates are also noted, right greater than left. Bibasilar calcifications are noted in the lung parenchyma, of indeterminate significance and stable since prior study. Adjacent to the left major fissure there is a small opacity which measures 8 x 5 mm and contains a central lucency. Upper abdomen: There are  parenchymal calcifications within the kidneys bilaterally. Chest wall/osseous structures: Midthoracic spondylosis. IMPRESSION: 1. Large pericardial effusion. 2. Small nodule within the left major fissure, with diameter of 7 mm. This is favored be benign, possibly related to pleural effusion, a pseudo tumor. Follow-up CT of the chest is recommended in 6 months to document resolution. 3. Small bilateral  pleural effusions, right greater than left. 4. Right greater than left lower lobe consolidation. Electronically Signed   By: Norva Pavlov M.D.   On: 06/09/2015 14:00   Mr Cervical Spine Wo Contrast  06/08/2015  CLINICAL DATA:  Back pain, worsening recently. EXAM: MRI CERVICAL SPINE WITHOUT CONTRAST TECHNIQUE: Multiplanar, multisequence MR imaging of the cervical spine was performed. No intravenous contrast was administered. COMPARISON:  None. FINDINGS: Foramen magnum is widely patent. C1-2 is normal. 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 C3-4: Spondylosis with endplate osteophytes and bulging of the disc. Facet arthropathy left worse than right with some edema of the facet joint on the left. Narrowing of the subarachnoid space surrounding the cord. AP diameter of the canal only 7 mm at this level. Mild deformity of the cord. Foraminal stenosis bilaterally because of osteophytic encroachment, left worse than right because of the facet arthropathy. C4-5: Spondylosis with endplate osteophytes and bulging of the disc. Effacement of the subarachnoid space surrounding the cord with slight cord deformity. AP diameter of the canal only 7 mm. Foraminal stenosis bilaterally because of osteophytic encroachment. C5-6: Spondylosis with endplate osteophytes and bulging disc material more pronounced towards the right. Effacement of the subarachnoid space surrounding the cord. AP diameter of the canal is 9 mm. Foraminal stenosis on the right that could compress the C6 nerve root. C6-7: Spondylosis with endplate osteophytes and bulging of the disc. Narrowing of the subarachnoid space surrounding the cord. AP diameter of the canal measures 9 mm. Foraminal stenosis right worse than left that could cause neural compression. C7-T1: Spondylosis with endplate osteophytes and bulging of the disc. No compressive narrowing of the central canal. Mild foraminal narrowing on the right and moderate foraminal  narrowing on the left. No abnormal cord edema. 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 Canal stenosis at C3-4 and C4-5 with AP diameter of canal only 7 mm at those levels. Pronounced facet arthropathy on the left at C3-4 with edema. This could be a cause of left-sided neck pain. Foraminal stenosis on the left at C3-4 could cause neural compression. Foraminal stenosis bilaterally at C4-5 could cause neural compression. Spondylosis at C5-6 and C6-7 with canal narrowing to a diameter of 9 mm. Foraminal stenosis bilaterally at those levels that could compress either or both C6 or C7 nerve roots. Foraminal narrowing on the left at C7-T1 that could be symptomatic. Electronically Signed   By: Paulina Fusi M.D.   On: 06/08/2015 18:35   Mr Lumbar Spine Wo Contrast  06/08/2015  CLINICAL DATA:  Back pain, acute worsening. EXAM: MRI LUMBAR SPINE WITHOUT CONTRAST TECHNIQUE: Multiplanar, multisequence MR imaging of the lumbar spine was performed. No intravenous contrast was administered. COMPARISON:  CT abdomen 06/05/2015 FINDINGS: Sagittal STIR imaging was performed. We are asked to abbreviated the study by the clinical service. There are non-compressive disc bulges at L2-3, L3-4, L4-5 and L5-S1. There is facet arthropathy at L4-5 and L5-S1 with 2 mm of anterolisthesis at L4-5. There is not appear to be any compressive stenosis of the central canal. Lower lumbar facet arthropathy could contribute to back pain, but  this would likely be chronic. 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 The facet arthropathy could be associated with low back pain. Electronically Signed   By: Paulina Fusi M.D.   On: 06/08/2015 18:37   Mr Thoracic Spine W Wo Contrast  06/08/2015  CLINICAL DATA:  Severe back pain with acute worsening. EXAM: MRI THORACIC SPINE  WITHOUT AND WITH CONTRAST TECHNIQUE: Multiplanar and multiecho pulse sequences of the thoracic spine were obtained without and with intravenous contrast. CONTRAST:  10mL MULTIHANCE GADOBENATE DIMEGLUMINE 529 MG/ML IV SOLN COMPARISON:  None. FINDINGS: T1-2:  Normal. T2-3:  Bulging of the disc.  No compressive stenosis. T3-4 through T10-11: Normal. No disc pathology. No fracture or focal bone lesion. The patient does have a layering pleural effusion on the right. T11-12: Spondyloarthropathy at this level. Mild edema within the disc space and of the adjacent endplates at T11 and T12. The surrounding soft tissues appear to show edematous change. This appearance could be due to single level degenerative spondyloarthropathy, but the possibility of discitis osteomyelitis certainly exists on the basis of the imaging. 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 Edema within the disc and within the T11 and T12 vertebral bodies. Surrounding edematous change in the paraspinous soft tissues. No evidence of epidural collection or neural compression. Sterile degenerative spondylosis can have this appearance, particularly in dialysis patients, but there is considerable concern for infection in this case. Electronically Signed   By: Paulina Fusi M.D.   On: 06/08/2015 18:42   Ct Abdomen Pelvis W Contrast  06/05/2015  CLINICAL DATA:  Back spasms.  Leukocytosis. EXAM: CT ABDOMEN AND PELVIS WITH CONTRAST TECHNIQUE: Multidetector CT imaging of the abdomen and pelvis was performed using the standard protocol following bolus administration of intravenous contrast. CONTRAST:  75mL OMNIPAQUE IOHEXOL 300 MG/ML  SOLN COMPARISON:  None. FINDINGS: Lower chest and abdominal wall: Chronic cardiomegaly. Remote subendocardial left ventricular infarct, greatest along the anterior wall. Chronic large pericardial effusion when compared to previous CT. Chronic small right pleural effusion with atelectasis or scar.  Hepatobiliary: Liver enhancement pattern suggests passive congestion. No focal lesion.Cholelithiasis without evidence of acute cholecystitis. No duct enlargement Pancreas: Unremarkable. Spleen: Unremarkable. Adrenals/Urinary Tract: Negative adrenals. Bilateral renal cortical thinning. Small nonobstructive bilateral renal calculi. Renal sinus cysts. Chronic hemorrhagic or proteinaceous cyst in the upper pole left kidney measuring 13 mm, also seen on April 2016 chest CT. Unremarkable decompressed bladder. Reproductive:Multiple uterine fibroids measuring up to 23 mm. Stomach/Bowel: No obstruction. Colonic diverticulosis without active inflammation. No evidence of appendicitis. Vascular/Lymphatic: No acute vascular abnormality. No mass or adenopathy. Peritoneal: Small ascites Musculoskeletal: No acute abnormalities. IMPRESSION: 1. No acute finding. 2. Remote left ventricular infarct with chronic cardiomegaly. Chronic large pericardial effusion and small right pleural effusion. 3. Passive congestion of the liver. 4. Cholelithiasis and nephrolithiasis. 5. Colonic diverticulosis. Electronically Signed   By: Marnee Spring M.D.   On: 06/05/2015 01:33   Ir US Guide Vasc Access Right  06/10/2015  CLINICAL DATA:  Chronic systolic CHF, needs long-term venous access for IV milrinone. Recent bacteremia. EXAM: TUNNELED CENTRAL VENOUS CATHETER PLACEMENT WITH ULTRASOUND AND FLUOROSCOPIC GUIDANCE TECHNIQUE: The procedure, risks, benefits, and alternatives were explained to the patient. Questions regarding the procedure were encouraged and answered. The patient understands and consents to the procedure. Patient was already receiving adequate antibiotic coverage.Patency of the right IJ vein was confirmed with ultrasound with image documentation.  An appropriate skin site was determined. Region was prepped using maximum barrier technique including cap and mask, sterile gown, sterile gloves, large sterile sheet, and Chlorhexidine as  cutaneous antisepsis. The region was infiltrated locally with 1% lidocaine. Under real-time ultrasound guidance, the right IJ vein was accessed with a 21 gauge micropuncture needle; the needle tip within the vein was confirmed with ultrasound image documentation. 84F dual-lumen cuffed powerPICC tunneled from a right anterior chest wall approach to the dermatotomy site. Needle exchanged over the 018 guidewire for transitional dilator, through which the catheter which had been cut to 21 cm was advanced under intermittent fluoroscopy, positioned with its tip at the cavoatrial junction. Spot chest radiograph confirms good catheter position. No pneumothorax. Catheter was flushed per protocol. Catheter secured externally with O Prolene suture. The right IJ dermatotomy site was closed with Dermabond. COMPLICATIONS: COMPLICATIONS None immediate FLUOROSCOPY TIME:  0.1 minute, 4.9 uGym2 DAP COMPARISON:  None IMPRESSION: 1. Technically successful placement of tunneled right IJ tunneled dual-lumen power injectable catheter with ultrasound and fluoroscopic guidance. Ready for routine use. Electronically Signed   By: Corlis Leak M.D.   On: 06/10/2015 12:35   Dg Chest Port 1 View  06/04/2015  CLINICAL DATA:  Shortness of breath EXAM: PORTABLE CHEST 1 VIEW COMPARISON:  03/22/2015 chest radiograph. FINDINGS: Right internal jugular central venous catheter terminates at the cavoatrial junction. Stable cardiomediastinal silhouette with moderate cardiomegaly. No pneumothorax. Small bilateral pleural effusions. Mild pulmonary edema. Mild bibasilar atelectasis. There is a new subcentimeter nodular density in the peripheral left mid lung. IMPRESSION: 1. Moderate cardiomegaly and mild pulmonary edema, in keeping with mild congestive heart failure. 2. Small bilateral pleural effusions and mild bibasilar atelectasis. 3. New subcentimeter nodular density in the peripheral left mid lung, cannot exclude a pulmonary nodule. Recommend correlation  with chest CT on a short term outpatient basis. Electronically Signed   By: Delbert Phenix M.D.   On: 06/04/2015 20:44   Ir Fluoro Guide Cv Midline Picc Right  06/10/2015  CLINICAL DATA:  Chronic systolic CHF, needs long-term venous access for IV milrinone. Recent bacteremia. EXAM: TUNNELED CENTRAL VENOUS CATHETER PLACEMENT WITH ULTRASOUND AND FLUOROSCOPIC GUIDANCE TECHNIQUE: The procedure, risks, benefits, and alternatives were explained to the patient. Questions regarding the procedure were encouraged and answered. The patient understands and consents to the procedure. Patient was already receiving adequate antibiotic coverage.Patency of the right IJ vein was confirmed with ultrasound with image documentation. An appropriate skin site was determined. Region was prepped using maximum barrier technique including cap and mask, sterile gown, sterile gloves, large sterile sheet, and Chlorhexidine as cutaneous antisepsis. The region was infiltrated locally with 1% lidocaine. Under real-time ultrasound guidance, the right IJ vein was accessed with a 21 gauge micropuncture needle; the needle tip within the vein was confirmed with ultrasound image documentation. 84F dual-lumen cuffed powerPICC tunneled from a right anterior chest wall approach to the dermatotomy site. Needle exchanged over the 018 guidewire for transitional dilator, through which the catheter which had been cut to 21 cm was advanced under intermittent fluoroscopy, positioned with its tip at the cavoatrial junction. Spot chest radiograph confirms good catheter position. No pneumothorax. Catheter was flushed per protocol. Catheter secured externally with O Prolene suture. The right IJ dermatotomy site was closed with Dermabond. COMPLICATIONS: COMPLICATIONS None immediate FLUOROSCOPY TIME:  0.1 minute, 4.9 uGym2 DAP COMPARISON:  None IMPRESSION: 1. Technically successful placement of tunneled right IJ tunneled dual-lumen power injectable catheter with  ultrasound and fluoroscopic guidance. Ready for routine use.  Electronically Signed   By: Corlis Leak M.D.   On: 06/10/2015 12:35    Jeoffrey Massed, MD  [facility] Pager:[phone]  If 7PM-7AM, please contact night-coverage [email or web address] Password [email or web address] 06/11/2015, 11:19 AM   LOS: 6 days

## 2015-06-11 NOTE — Progress Notes (Signed)
Patient ID: Katrina Peck, female   DOB: [birth year], 70 y.o.   MRN: [id number]     SUBJECTIVE: Remains afebrile.  Now has tunneled catheter replaced.  Still with neck pain.  No dyspnea.  CVP 5-6 today.   Cultures with MSSA and Klebsiella.    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 amiodarone  100 mg Oral Daily  . cefTRIAXone (ROCEPHIN)  IV  2 g Intravenous Q24H  . digoxin  0.0625 mg Oral Daily  . feeding supplement  1 Container Oral TID BM  . magnesium oxide  200 mg Oral BID  . polyethylene glycol  17 g Oral Daily  . sodium chloride flush  3 mL Intravenous Q12H  . spironolactone  12.5 mg Oral Daily  . torsemide  20 mg Oral Daily  . warfarin  6 mg Oral ONCE-1800  . Warfarin - Pharmacist Dosing Inpatient   Does not apply q1800   Continuous Infusions: . milrinone 0.25 mcg/kg/min (06/10/15 1702)   PRN Meds:.acetaminophen **OR** acetaminophen, HYDROmorphone (DILAUDID) injection, methocarbamol, morphine injection, oxyCODONE-acetaminophen, sodium chloride flush    Filed Vitals:   06/11/15 0500 06/11/15 0600 06/11/15 0755 06/11/15 1144  BP: 102/68 101/67 107/70 105/68  Pulse:   67 72  Temp:   98 F (36.7 C) 97.7 F (36.5 C)  TempSrc:   Oral Oral  Resp:  16 18 18   Height:      Weight:      SpO2:   100% 100%    Intake/Output Summary (Last 24 hours) at 06/11/15 1148 Last data filed at 06/11/15 1100  Gross per 24 hour  Intake  726.5 ml  Output   1525 ml  Net -798.5 ml    LABS: Basic Metabolic Panel:  Recent Labs  40/98/11 0440 06/11/15 0519  NA 136 135  K 4.0 3.9  CL 98* 96*  CO2 31 29  GLUCOSE 110* 97  BUN 13 15  CREATININE 0.92 0.75  CALCIUM 8.9 9.1   Liver Function Tests: No results for input(s): AST, ALT, ALKPHOS, BILITOT, PROT, ALBUMIN in the last 72 hours. No results for input(s): LIPASE, AMYLASE in the  last 72 hours. CBC:  Recent Labs  06/10/15 0440 06/11/15 0519  WBC 8.9 9.9  NEUTROABS  --  7.5  HGB 10.4* 10.4*  HCT 33.3* 33.5*  MCV 84.9 84.6  PLT 293 338   Cardiac Enzymes: No results for input(s): CKTOTAL, CKMB, CKMBINDEX, TROPONINI in the last 72 hours. BNP: Invalid input(s): POCBNP D-Dimer: No results for input(s): DDIMER in the last 72 hours. Hemoglobin A1C: No results for input(s): HGBA1C in the last 72 hours. Fasting Lipid Panel: No results for input(s): CHOL, HDL, LDLCALC, TRIG, CHOLHDL, LDLDIRECT in the last 72 hours. Thyroid Function Tests: No results for input(s): TSH, T4TOTAL, T3FREE, THYROIDAB in the last 72 hours.  Invalid input(s): FREET3 Anemia Panel: No results for input(s): VITAMINB12, FOLATE, FERRITIN, TIBC, IRON, RETICCTPCT in the last 72 hours.  RADIOLOGY: Ct Chest Wo Contrast  06/09/2015  CLINICAL DATA:  Lung nodule. Hx HTN, CHF, NICM, and pericardial effusion. EXAM: CT CHEST WITHOUT CONTRAST TECHNIQUE: Multidetector CT imaging of the chest was performed following the standard protocol without IV contrast. COMPARISON:  06/04/2015, 07/24/2014 FINDINGS: Heart: There is a large pericardial effusion, measuring 3.5 cm adjacent to the right atrium. Aortic valve calcification. No significant coronary artery calcification. Vascular structures: There is mild  atherosclerotic calcification of the thoracic aorta. No associated aneurysm. Pulmonary arteries have a normal noncontrast appearance. Mediastinum/thyroid: The visualized portion of the thyroid gland has a normal appearance. No mediastinal, hilar, or axillary adenopathy. Lungs/Airways: There are bilateral pleural effusions, right greater than left. Associated bilateral lower lobe infiltrates are also noted, right greater than left. Bibasilar calcifications are noted in the lung parenchyma, of indeterminate significance and stable since prior study. Adjacent to the left major fissure there is a small opacity which  measures 8 x 5 mm and contains a central lucency. Upper abdomen: There are parenchymal calcifications within the kidneys bilaterally. Chest wall/osseous structures: Midthoracic spondylosis. IMPRESSION: 1. Large pericardial effusion. 2. Small nodule within the left major fissure, with diameter of 7 mm. This is favored be benign, possibly related to pleural effusion, a pseudo tumor. Follow-up CT of the chest is recommended in 6 months to document resolution. 3. Small bilateral pleural effusions, right greater than left. 4. Right greater than left lower lobe consolidation. Electronically Signed   By: Norva Pavlov M.D.   On: 06/09/2015 14:00   Mr Cervical Spine Wo Contrast  06/08/2015  CLINICAL DATA:  Back pain, worsening recently. EXAM: MRI CERVICAL SPINE WITHOUT CONTRAST TECHNIQUE: Multiplanar, multisequence MR imaging of the cervical spine was performed. No intravenous contrast was administered. COMPARISON:  None. FINDINGS: Foramen magnum is widely patent. C1-2 is normal. 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 C3-4: Spondylosis with endplate osteophytes and bulging of the disc. Facet arthropathy left worse than right with some edema of the facet joint on the left. Narrowing of the subarachnoid space surrounding the cord. AP diameter of the canal only 7 mm at this level. Mild deformity of the cord. Foraminal stenosis bilaterally because of osteophytic encroachment, left worse than right because of the facet arthropathy. C4-5: Spondylosis with endplate osteophytes and bulging of the disc. Effacement of the subarachnoid space surrounding the cord with slight cord deformity. AP diameter of the canal only 7 mm. Foraminal stenosis bilaterally because of osteophytic encroachment. C5-6: Spondylosis with endplate osteophytes and bulging disc material more pronounced towards the right. Effacement of the subarachnoid space surrounding the cord. AP diameter of the canal is 9 mm. Foraminal stenosis  on the right that could compress the C6 nerve root. C6-7: Spondylosis with endplate osteophytes and bulging of the disc. Narrowing of the subarachnoid space surrounding the cord. AP diameter of the canal measures 9 mm. Foraminal stenosis right worse than left that could cause neural compression. C7-T1: Spondylosis with endplate osteophytes and bulging of the disc. No compressive narrowing of the central canal. Mild foraminal narrowing on the right and moderate foraminal narrowing on the left. No abnormal cord edema. 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 Canal stenosis at C3-4 and C4-5 with AP diameter of canal only 7 mm at those levels. Pronounced facet arthropathy on the left at C3-4 with edema. This could be a cause of left-sided neck pain. Foraminal stenosis on the left at C3-4 could cause neural compression. Foraminal stenosis bilaterally at C4-5 could cause neural compression. Spondylosis at C5-6 and C6-7 with canal narrowing to a diameter of 9 mm. Foraminal stenosis bilaterally at those levels that could compress either or both C6 or C7 nerve roots. Foraminal narrowing on the left at C7-T1 that could be symptomatic. Electronically Signed   By: Paulina Fusi M.D.   On: 06/08/2015 18:35   Mr Lumbar Spine Wo Contrast  06/08/2015  CLINICAL DATA:  Back pain, acute worsening. EXAM: MRI LUMBAR SPINE WITHOUT CONTRAST TECHNIQUE: Multiplanar, multisequence MR imaging of the lumbar spine was performed. No intravenous contrast was administered. COMPARISON:  CT abdomen 06/05/2015 FINDINGS: Sagittal STIR imaging was performed. We are asked to abbreviated the study by the clinical service. There are non-compressive disc bulges at L2-3, L3-4, L4-5 and L5-S1. There is facet arthropathy at L4-5 and L5-S1 with 2 mm of anterolisthesis at L4-5. There is not appear to be any compressive stenosis of the central canal. Lower lumbar facet arthropathy could contribute  to back pain, but this would likely be chronic. 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 The facet arthropathy could be associated with low back pain. Electronically Signed   By: Paulina Fusi M.D.   On: 06/08/2015 18:37   Mr Thoracic Spine W Wo Contrast  06/08/2015  CLINICAL DATA:  Severe back pain with acute worsening. EXAM: MRI THORACIC SPINE WITHOUT AND WITH CONTRAST TECHNIQUE: Multiplanar and multiecho pulse sequences of the thoracic spine were obtained without and with intravenous contrast. CONTRAST:  48mL MULTIHANCE GADOBENATE DIMEGLUMINE 529 MG/ML IV SOLN COMPARISON:  None. FINDINGS: T1-2:  Normal. T2-3:  Bulging of the disc.  No compressive stenosis. T3-4 through T10-11: Normal. No disc pathology. No fracture or focal bone lesion. The patient does have a layering pleural effusion on the right. T11-12: Spondyloarthropathy at this level. Mild edema within the disc space and of the adjacent endplates at T11 and T12. The surrounding soft tissues appear to show edematous change. This appearance could be due to single level degenerative spondyloarthropathy, but the possibility of discitis osteomyelitis certainly exists on the basis of the imaging. 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 Edema within the disc and within the T11 and T12 vertebral bodies. Surrounding edematous change in the paraspinous soft tissues. No evidence of epidural collection or neural compression. Sterile degenerative spondylosis can have this appearance, particularly in dialysis patients, but there is considerable concern for infection in this case. Electronically Signed   By: Paulina Fusi M.D.   On: 06/08/2015 18:42   Ct Abdomen Pelvis W Contrast  06/05/2015  CLINICAL DATA:  Back spasms.  Leukocytosis. EXAM: CT ABDOMEN AND PELVIS WITH CONTRAST TECHNIQUE: Multidetector CT  imaging of the abdomen and pelvis was performed using the standard protocol following bolus administration of intravenous contrast. CONTRAST:  38mL OMNIPAQUE IOHEXOL 300 MG/ML  SOLN COMPARISON:  None. FINDINGS: Lower chest and abdominal wall: Chronic cardiomegaly. Remote subendocardial left ventricular infarct, greatest along the anterior wall. Chronic large pericardial effusion when compared to previous CT. Chronic small right pleural effusion with atelectasis or scar. Hepatobiliary: Liver enhancement pattern suggests passive congestion. No focal lesion.Cholelithiasis without evidence of acute cholecystitis. No duct enlargement Pancreas: Unremarkable. Spleen: Unremarkable. Adrenals/Urinary Tract: Negative adrenals. Bilateral renal cortical thinning. Small nonobstructive bilateral renal calculi. Renal sinus cysts. Chronic hemorrhagic or proteinaceous cyst in the upper pole left kidney measuring 13 mm, also seen on April 2016 chest CT. Unremarkable decompressed bladder. Reproductive:Multiple uterine fibroids measuring up to 23 mm. Stomach/Bowel: No obstruction. Colonic diverticulosis without active inflammation. No evidence of appendicitis. Vascular/Lymphatic: No acute vascular abnormality. No mass or adenopathy. Peritoneal: Small ascites Musculoskeletal: No acute abnormalities. IMPRESSION: 1. No acute finding. 2. Remote left ventricular infarct with chronic cardiomegaly. Chronic large pericardial effusion and small right pleural effusion. 3. Passive congestion of the liver. 4. Cholelithiasis and nephrolithiasis. 5. Colonic diverticulosis. Electronically  Signed   By: Marnee Spring M.D.   On: 06/05/2015 01:33   Ir US Guide Vasc Access Right  06/10/2015  CLINICAL DATA:  Chronic systolic CHF, needs long-term venous access for IV milrinone. Recent bacteremia. EXAM: TUNNELED CENTRAL VENOUS CATHETER PLACEMENT WITH ULTRASOUND AND FLUOROSCOPIC GUIDANCE TECHNIQUE: The procedure, risks, benefits, and alternatives were  explained to the patient. Questions regarding the procedure were encouraged and answered. The patient understands and consents to the procedure. Patient was already receiving adequate antibiotic coverage.Patency of the right IJ vein was confirmed with ultrasound with image documentation. An appropriate skin site was determined. Region was prepped using maximum barrier technique including cap and mask, sterile gown, sterile gloves, large sterile sheet, and Chlorhexidine as cutaneous antisepsis. The region was infiltrated locally with 1% lidocaine. Under real-time ultrasound guidance, the right IJ vein was accessed with a 21 gauge micropuncture needle; the needle tip within the vein was confirmed with ultrasound image documentation. 47F dual-lumen cuffed powerPICC tunneled from a right anterior chest wall approach to the dermatotomy site. Needle exchanged over the 018 guidewire for transitional dilator, through which the catheter which had been cut to 21 cm was advanced under intermittent fluoroscopy, positioned with its tip at the cavoatrial junction. Spot chest radiograph confirms good catheter position. No pneumothorax. Catheter was flushed per protocol. Catheter secured externally with O Prolene suture. The right IJ dermatotomy site was closed with Dermabond. COMPLICATIONS: COMPLICATIONS None immediate FLUOROSCOPY TIME:  0.1 minute, 4.9 uGym2 DAP COMPARISON:  None IMPRESSION: 1. Technically successful placement of tunneled right IJ tunneled dual-lumen power injectable catheter with ultrasound and fluoroscopic guidance. Ready for routine use. Electronically Signed   By: Corlis Leak M.D.   On: 06/10/2015 12:35   Dg Chest Port 1 View  06/04/2015  CLINICAL DATA:  Shortness of breath EXAM: PORTABLE CHEST 1 VIEW COMPARISON:  03/22/2015 chest radiograph. FINDINGS: Right internal jugular central venous catheter terminates at the cavoatrial junction. Stable cardiomediastinal silhouette with moderate cardiomegaly. No  pneumothorax. Small bilateral pleural effusions. Mild pulmonary edema. Mild bibasilar atelectasis. There is a new subcentimeter nodular density in the peripheral left mid lung. IMPRESSION: 1. Moderate cardiomegaly and mild pulmonary edema, in keeping with mild congestive heart failure. 2. Small bilateral pleural effusions and mild bibasilar atelectasis. 3. New subcentimeter nodular density in the peripheral left mid lung, cannot exclude a pulmonary nodule. Recommend correlation with chest CT on a short term outpatient basis. Electronically Signed   By: Delbert Phenix M.D.   On: 06/04/2015 20:44   Ir Fluoro Guide Cv Midline Picc Right  06/10/2015  CLINICAL DATA:  Chronic systolic CHF, needs long-term venous access for IV milrinone. Recent bacteremia. EXAM: TUNNELED CENTRAL VENOUS CATHETER PLACEMENT WITH ULTRASOUND AND FLUOROSCOPIC GUIDANCE TECHNIQUE: The procedure, risks, benefits, and alternatives were explained to the patient. Questions regarding the procedure were encouraged and answered. The patient understands and consents to the procedure. Patient was already receiving adequate antibiotic coverage.Patency of the right IJ vein was confirmed with ultrasound with image documentation. An appropriate skin site was determined. Region was prepped using maximum barrier technique including cap and mask, sterile gown, sterile gloves, large sterile sheet, and Chlorhexidine as cutaneous antisepsis. The region was infiltrated locally with 1% lidocaine. Under real-time ultrasound guidance, the right IJ vein was accessed with a 21 gauge micropuncture needle; the needle tip within the vein was confirmed with ultrasound image documentation. 47F dual-lumen cuffed powerPICC tunneled from a right anterior chest wall approach to the dermatotomy site. Needle exchanged over the 018  guidewire for transitional dilator, through which the catheter which had been cut to 21 cm was advanced under intermittent fluoroscopy, positioned with  its tip at the cavoatrial junction. Spot chest radiograph confirms good catheter position. No pneumothorax. Catheter was flushed per protocol. Catheter secured externally with O Prolene suture. The right IJ dermatotomy site was closed with Dermabond. COMPLICATIONS: COMPLICATIONS None immediate FLUOROSCOPY TIME:  0.1 minute, 4.9 uGym2 DAP COMPARISON:  None IMPRESSION: 1. Technically successful placement of tunneled right IJ tunneled dual-lumen power injectable catheter with ultrasound and fluoroscopic guidance. Ready for routine use. Electronically Signed   By: Corlis Leak M.D.   On: 06/10/2015 12:35    PHYSICAL EXAM General: Appears older than stated age, Chronically ill appearing.   Neck: JVP 7 cm, no thyromegaly or thyroid nodule.  Lungs: Decreased bases, normal effort CV: Lateral PMI.  Heart regular S1/S2, no S3/S4, no murmur.  No edema.   Abdomen: Soft, NT, ND, no HSM. No bruits or masses. +BS  Neurologic: Alert and oriented x 3.  Psych: Normal affect. Extremities: No clubbing or cyanosis.   TELEMETRY: Reviewed, NSR  ASSESSMENT AND PLAN: 70 yo with nonischemic cardiomyopathy, chronic moderate pleural effusion, paroxysmal atrial fibrillation, and LV thrombus on home milrinone presented with severe back pain.  WBCs high, concern for tunneled catheter infection with possible spread cause discitis/back pain.     1. ID: MSSA and Klebsiella bacteremia, likely line sepsis.  Original line out.  ID following.  Remains afebrile, now on ceftriaxone.  TEE negative for vegetation or endocarditis.  MRI showed T11-T12 osteomyelitis.  Now has new tunneled catheter with repeat cultures negative.  - Ceftriaxone for 6 wks per ID.   2. Acute on chronic systolic CHF: Echo with EF 20%, moderate pericardial effusion.  She is milrinone dependent. CVP 5-6 today.  - Continue milrinone.  - Can stop IV Lasix and start back on home torsemide 20 mg daily.   - Continue digoxin.  3. LV mural thrombus: Continue coumadin.    4. Deconditioning: Needs work with PT, will see if we can get her in CIR.  5. Can go to telemetry.     [name]  06/11/2015 11:48 AM

## 2015-06-11 NOTE — Progress Notes (Signed)
ANTICOAGULATION CONSULT NOTE - Follow Up Consult  Pharmacy Consult for warfarin Indication: Afib and history of LV thrombus  No Known Allergies  Patient Measurements: Height: 5\' 2"  (157.5 cm) Weight: 103 lb 2.8 oz (46.8 kg) IBW/kg (Calculated) : 50.1  Vital Signs: Temp: 98 F (36.7 C) (03/18 0755) Temp Source: Oral (03/18 0755) BP: 107/70 mmHg (03/18 0755) Pulse Rate: 67 (03/18 0755)  Labs:  Recent Labs  06/09/15 0300 06/10/15 0440 06/11/15 0519  HGB 10.4* 10.4* 10.4*  HCT 33.4* 33.3* 33.5*  PLT 291 293 338  LABPROT 27.2* 23.9* 22.9*  INR 2.57* 2.16* 2.04*  CREATININE 0.82 0.92 0.75    Estimated Creatinine Clearance: 49.7 mL/min (by C-G formula based on Cr of 0.75).   Assessment: 70 yo F admitted 06/04/2015 on chronic warfarin for history of Afib and LV thrombus. Patient missed dose on 3/15 but received dose on 3/16 and 3/17.  PTA warfarin dose: 4 mg MWF and 6 mg all other days  INR 2.04, Hgb stable, Plt wnl. No s/sx of bleeding noted.  Goal of Therapy:  INR 2-3 Monitor platelets by anticoagulation protocol: Yes   Plan:  - Warfarin 6 mg PO x1 tonight - Monitor daily INR, CBC and s/sx of bleeding  Casilda Carls, PharmD. PGY-1 Pharmacy Resident Pager: [phone] 06/11/2015,10:30 AM

## 2015-06-12 LAB — CBC WITH DIFFERENTIAL/PLATELET
Basophils Absolute: 0 10*3/uL (ref 0.0–0.1)
Basophils Relative: 0 %
EOS ABS: 0.1 10*3/uL (ref 0.0–0.7)
Eosinophils Relative: 2 %
HEMATOCRIT: 33 % — AB (ref 36.0–46.0)
HEMOGLOBIN: 10.3 g/dL — AB (ref 12.0–15.0)
LYMPHS ABS: 1.2 10*3/uL (ref 0.7–4.0)
LYMPHS PCT: 13 %
MCH: 26.3 pg (ref 26.0–34.0)
MCHC: 31.2 g/dL (ref 30.0–36.0)
MCV: 84.4 fL (ref 78.0–100.0)
MONOS PCT: 11 %
Monocytes Absolute: 1 10*3/uL (ref 0.1–1.0)
NEUTROS PCT: 74 %
Neutro Abs: 6.6 10*3/uL (ref 1.7–7.7)
Platelets: 348 10*3/uL (ref 150–400)
RBC: 3.91 MIL/uL (ref 3.87–5.11)
RDW: 15 % (ref 11.5–15.5)
WBC: 8.8 10*3/uL (ref 4.0–10.5)

## 2015-06-12 LAB — BASIC METABOLIC PANEL
Anion gap: 9 (ref 5–15)
BUN: 16 mg/dL (ref 6–20)
CHLORIDE: 95 mmol/L — AB (ref 101–111)
CO2: 31 mmol/L (ref 22–32)
CREATININE: 0.79 mg/dL (ref 0.44–1.00)
Calcium: 9.1 mg/dL (ref 8.9–10.3)
GFR calc non Af Amer: >60 mL/min (ref >60)
Glucose, Bld: 126 mg/dL — ABNORMAL HIGH (ref 65–99)
Potassium: 3.9 mmol/L (ref 3.5–5.1)
Sodium: 135 mmol/L (ref 135–145)

## 2015-06-12 LAB — PROTIME-INR
INR: 1.8 — ABNORMAL HIGH (ref 0.00–1.49)
Prothrombin Time: 20.9 seconds — ABNORMAL HIGH (ref 11.6–15.2)

## 2015-06-12 MED ORDER — WARFARIN SODIUM 7.5 MG PO TABS
7.5000 mg | ORAL_TABLET | Freq: Once | ORAL | Status: AC
  Administered 2015-06-12: 7.5 mg via ORAL
  Filled 2015-06-12: qty 1

## 2015-06-12 NOTE — Clinical Social Work Placement (Signed)
   CLINICAL SOCIAL WORK PLACEMENT  NOTE  Date:  06/12/2015  Patient Details  Name: LANYIA MARGULIS MRN: [id number] Date of Birth: 1945-09-02  Clinical Social Work is seeking post-discharge placement for this patient at the Skilled  Nursing Facility level of care (*CSW will initial, date and re-position this form in  chart as items are completed):      Patient/family provided with [facility] Clinical Social Work Department's list of facilities offering this level of care within the geographic area requested by the patient (or if unable, by the patient's family).      Patient/family informed of their freedom to choose among providers that offer the needed level of care, that participate in Medicare, Medicaid or managed care program needed by the patient, have an available bed and are willing to accept the patient.      Patient/family informed of [facility]'s ownership interest in [facility] and [facility], as well as of the fact that they are under no obligation to receive care at these facilities.  PASRR submitted to EDS on 06/12/15     PASRR number received on 06/12/15     Existing PASRR number confirmed on       FL2 transmitted to all facilities in geographic area requested by pt/family on 06/12/15     FL2 transmitted to all facilities within larger geographic area on       Patient informed that his/her managed care company has contracts with or will negotiate with certain facilities, including the following:            Patient/family informed of bed offers received.  Patient chooses bed at       Physician recommends and patient chooses bed at      Patient to be transferred to   on  .  Patient to be transferred to facility by       Patient family notified on   of transfer.  Name of family member notified:        PHYSICIAN       Additional Comment:    _______________________________________________ Mearl Latin, LCSWA 06/12/2015, 5:51 PM

## 2015-06-12 NOTE — Clinical Social Work Note (Signed)
Clinical Social Work Assessment  Patient Details  Name: Katrina Peck MRN: [id number] Date of Birth: 1945-04-19  Date of referral:  06/12/15               Reason for consult:  Facility Placement                Permission sought to share information with:  Facility Sport and exercise psychologist, Family Supports Permission granted to share information::  Yes, Verbal Permission Granted  Name::     Elberta Fortis  Agency::  [facility]  Relationship::  Spouse  Contact Information:  [phone]  Housing/Transportation Living arrangements for the past 2 months:  [place] of Information:  Patient Patient Interpreter Needed:  None Criminal Activity/Legal Involvement Pertinent to Current Situation/Hospitalization:  No - Comment as needed Significant Relationships:  Adult Children, Spouse Lives with:  Spouse Do you feel safe going back to the place where you live?  No Need for family participation in patient care:  Yes (Comment)  Care giving concerns:  CSW received referral for possible SNF placement at time of discharge. CSW met with patient at bedside regarding PT recommendation of SNF placement at time of discharge. Patient reported being unable to take care of herself at home given patient's current physical needs and fall risk. Patient expressed understanding of PT recommendation and is agreeable to SNF placement at time of discharge if [facility] is unable to admit. CSW to continue to follow and assist with discharge planning needs.   Social Worker assessment / plan:  CSW spoke with patient concerning possibility of rehab at [facility] before returning home if CIR is unable to admit.  Employment status:  Retired Forensic scientist:  Medicare PT Recommendations:  [place] / Referral to community resources:  [place]  Patient/Family's Response to care:  Patient is pleasant though in some pain and recognizes need for rehab before returning  home and is agreeable to a SNF in [facility] if [facility] is unable to admit.  Patient/Family's Understanding of and Emotional Response to Diagnosis, Current Treatment, and Prognosis:  Patient is realistic regarding therapy needs. No questions/concerns about plan or treatment.    Emotional Assessment Appearance:  Appears stated age Attitude/Demeanor/Rapport:   (Appropriate) Affect (typically observed):  Accepting, Appropriate Orientation:  Oriented to Self, Oriented to Place, Oriented to  Time, Oriented to Situation Alcohol / Substance use:  Not Applicable Psych involvement (Current and /or in the community):  No (Comment)  Discharge Needs  Concerns to be addressed:  Care Coordination Readmission within the last 30 days:  No Current discharge risk:  None Barriers to Discharge:  Continued Medical Work up   [employer], [place] 06/12/2015, 4:25 PM

## 2015-06-12 NOTE — Discharge Instructions (Addendum)
Follow with Primary MD DEWEY,ELIZABETH, MD in 3-4 days   Get CBC, CMP, Magnesium, INR,  2 view Chest X ray checked  by SNF MD in 2-3 days.    Activity: As tolerated with Full fall precautions use walker/cane & assistance as needed   Disposition Home     Diet:   Heart Healthy Check your Weight same time everyday, if you gain over 2 pounds, or you develop in leg swelling, experience more shortness of breath or chest pain, call your Primary MD immediately. Follow Cardiac Low Salt Diet and 1.2 lit/day fluid restriction.   On your next visit with your primary care physician please Get Medicines reviewed and adjusted.   Please request your Prim.MD to go over all Hospital Tests and Procedure/Radiological results at the follow up, please get all Hospital records sent to your Prim MD by signing hospital release before you go home.   If you experience worsening of your admission symptoms, develop shortness of breath, life threatening emergency, suicidal or homicidal thoughts you must seek medical attention immediately by calling 911 or calling your MD immediately  if symptoms less severe.  You Must read complete instructions/literature along with all the possible adverse reactions/side effects for all the Medicines you take and that have been prescribed to you. Take any new Medicines after you have completely understood and accpet all the possible adverse reactions/side effects.   Do not drive, operating heavy machinery, perform activities at heights, swimming or participation in water activities or provide baby sitting services if your were admitted for syncope or siezures until you have seen by Primary MD or a Neurologist and advised to do so again.  Do not drive when taking Pain medications.    Do not take more than prescribed Pain, Sleep and Anxiety Medications  Special Instructions: If you have smoked or chewed Tobacco  in the last 2 yrs please stop smoking, stop any regular Alcohol  and or  any Recreational drug use.  Wear Seat belts while driving.   Please note  You were cared for by a hospitalist during your hospital stay. If you have any questions about your discharge medications or the care you received while you were in the hospital after you are discharged, you can call the unit and asked to speak with the hospitalist on call if the hospitalist that took care of you is not available. Once you are discharged, your primary care physician will handle any further medical issues. Please note that NO REFILLS for any discharge medications will be authorized once you are discharged, as it is imperative that you return to your primary care physician (or establish a relationship with a primary care physician if you do not have one) for your aftercare needs so that they can reassess your need for medications and monitor your lab values.

## 2015-06-12 NOTE — Progress Notes (Signed)
PATIENT DETAILS Name: FATINA SPRANKLE Age: 70 y.o. Sex: female Date of Birth: 10-May-1945 Admit Date: 06/04/2015 Admitting Physician Lorretta Harp, MD WUJ:WJXBJ,[id number], MD   Brief narrative:  71 year old female with history of chronic systolic heart failure-on milrinone infusion at home via a tunneled catheter, presented to the hospital with severe lower back pain. Found to have significant leukocytosis. 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 Seen by infectious disease, recommendations are to continue IV Rocephin until April 24. Plans are for either CIR or SNF on discharge  Subjective:  Denies Fever chills, no headache, No chest - abd  Pain, back pain continues to improve. Shortness of breath stable.   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 Tunneled catheter removed by IR on 3/13.  MRI entire spine with gen anesthesia/sedation showed T11/T12 discitis. TEE 3/13 without any vegetations. Repeat blood cultures negative, subsequently midline catheter reinserted. ID now recommending IV Rocephin until April 24. Needs weekly CBC and CMP faxed to infectious disease clinic when discharged.    Acute on chronic systolic heart failure (EF 20% by TTE on December 2016): Hx of Nonischemic cardiomyopathy.LHC(4/16) with no significant coronary disease. Now minimal fluid overload on exam-  CHF team following, on Aldactone + Demadex PO. Continue milrinone and digoxin. Likely DC in am.  History of LV thrombus: On chronic Coumadin. Pharmacy dosing-INR therapeutic.  Paroxysmal atrial fibrillation: Rate controlled with amiodarone, on chronic Coumadin. CHADS2VASC 5.  Lab Results  Component Value Date   INR 1.80* 06/12/2015   INR 2.04* 06/11/2015   INR 2.16* 06/10/2015     Chronic pericardial effusion: s/p window 07/26/14-cytology was negative. CT abdomen done on admission shows a large pericardial effusion-however TEE 3/13 showed moderate pericardial effusion-no tamponade  Pul HYQ:MVHQIO secondary to LV failure  Nodular density in left mid lung on chest x-ray: CT chest confirmed a small nodule in the left major fissure, recommendations from radiology are to repeat a CT chest in 6 months.  Protein-calorie malnutrition, severe:nutriton evaluation  Disposition: CIR vs SNF on 06/13/2015 .  Antimicrobial agents  See below  Anti-infectives    Start     Dose/Rate Route Frequency Ordered Stop   06/11/15 0200  cefTRIAXone (ROCEPHIN) 2 g in dextrose 5 % 50 mL IVPB     2 g 100 mL/hr over 30 Minutes Intravenous Every 24 hours 06/10/15 1429     06/09/15 1500  cefTRIAXone (ROCEPHIN) 2 g in dextrose 5 % 50 mL IVPB  Status:  Discontinued     2 g 100 mL/hr over 30 Minutes Intravenous Every 12 hours 06/09/15 1431 06/10/15 1429   06/07/15 1600  Ampicillin-Sulbactam (UNASYN) 3 g in sodium chloride 0.9 % 100 mL IVPB  Status:  Discontinued     3 g 100 mL/hr over 60 Minutes Intravenous Every 6 hours 06/07/15 1455 06/09/15 1431   06/07/15 0813  vancomycin (VANCOCIN) IVPB 750 mg/150 ml premix     750 mg 150 mL/hr over 60 Minutes Intravenous Every 24 hours 06/07/15 0813 06/07/15 1000   06/06/15 0400  vancomycin (VANCOCIN) IVPB 750 mg/150 ml premix  Status:  Discontinued     750 mg 150 mL/hr over 60 Minutes Intravenous Every 24 hours 06/05/15 0302 06/07/15 0813   06/05/15 1400  piperacillin-tazobactam (ZOSYN) IVPB 3.375 g  Status:  Discontinued     3.375 g 12.5 mL/hr over 240 Minutes Intravenous 3 times per day 06/05/15 0302 06/05/15 1202   06/05/15 1215  cefTRIAXone (ROCEPHIN) 2 g in dextrose 5 % 50 mL IVPB  Status:  Discontinued     2 g 100 mL/hr over 30 Minutes Intravenous Every 24 hours 06/05/15 1202 06/07/15 1422   06/05/15 0315  piperacillin-tazobactam (ZOSYN)  IVPB 3.375 g     3.375 g 100 mL/hr over 30 Minutes Intravenous STAT 06/05/15 0302 06/05/15 0610   06/05/15 0315  vancomycin (VANCOCIN) IVPB 1000 mg/200 mL premix     1,000 mg 200 mL/hr over 60 Minutes Intravenous NOW 06/05/15 0302 06/05/15 0533      DVT Prophylaxis: Coumadin  Code Status: Full code   Family Communication None at bedside  Procedures: None  CONSULTS:  cardiology and ID  Time spent 25 minutes-Greater than 50% of this time was spent in counseling, explanation of diagnosis, planning of further management, and coordination of care.  MEDICATIONS: Scheduled Meds: . amiodarone  100 mg Oral Daily  . cefTRIAXone (ROCEPHIN)  IV  2 g Intravenous Q24H  . digoxin  0.0625 mg Oral Daily  . feeding supplement  1 Container Oral TID BM  . magnesium oxide  200 mg Oral BID  . polyethylene glycol  17 g Oral Daily  . sodium chloride flush  3 mL Intravenous Q12H  . spironolactone  12.5 mg Oral Daily  . torsemide  20 mg Oral Daily  . warfarin  7.5 mg Oral ONCE-1800  . Warfarin - Pharmacist Dosing Inpatient   Does not apply q1800   Continuous Infusions: . milrinone 0.25 mcg/kg/min (06/11/15 1654)   PRN Meds:.acetaminophen **OR** acetaminophen, HYDROmorphone (DILAUDID) injection, methocarbamol, morphine injection, oxyCODONE-acetaminophen, sodium chloride flush    PHYSICAL EXAM: Vital signs in last 24 hours: Filed Vitals:   06/11/15 1500 06/11/15 1616 06/11/15 2037 06/12/15 0427  BP: 104/70 102/70 105/69 118/65  Pulse: 69 75  70  Temp:  98.6 F (37 C) 98.2 F (36.8 C) 97.9 F (36.6 C)  TempSrc:  Oral Oral Oral  Resp:  11 16 18   Height:  5\' 2"  (1.575 m)    Weight:  46.8 kg (103 lb 2.8 oz)  47 kg (103 lb 9.9 oz)  SpO2: 100% 97% 100% 100%    Weight change: 0 kg (0 lb) Filed Weights   06/11/15 0405 06/11/15 1616 06/12/15 0427  Weight: 46.8 kg (103 lb 2.8 oz) 46.8 kg (103 lb 2.8 oz) 47 kg (103 lb 9.9 oz)   Body mass index is 18.95 kg/(m^2).   Gen Exam: Awake and  alert with clear speech.   Neck: Supple Chest: Clear bilaterally CVS: S1 S2 Regular Abdomen: soft, BS +, non tender, non distended.  Extremities: trace edema, lower extremities warm to touch,+chronic venous stasis changes Neurologic:gen weakness-but not focal Skin: No Rash.   Wounds: N/A.    Intake/Output from previous day:  Intake/Output Summary (Last 24 hours) at 06/12/15 1325 Last data filed at 06/12/15 0500  Gross per 24 hour  Intake    106 ml  Output    200 ml  Net    -94 ml     LAB RESULTS: CBC  Recent Labs Lab 06/08/15 0320 06/09/15 0300 06/10/15 0440 06/11/15 0519 06/12/15 0545  WBC 13.0* 12.2* 8.9 9.9 8.8  HGB 11.5* 10.4* 10.4* 10.4* 10.3*  HCT 35.1* 33.4* 33.3* 33.5* 33.0*  PLT 266 291 293 338 348  MCV 84.6 85.2 84.9 84.6 84.4  MCH 27.7 26.5 26.5 26.3 26.3  MCHC 32.8 31.1 31.2 31.0 31.2  RDW 15.1 15.2 15.1 14.9 15.0  LYMPHSABS  --   --   --  1.4 1.2  MONOABS  --   --   --  0.7 1.0  EOSABS  --   --   --  0.2 0.1  BASOSABS  --   --   --  0.0 0.0    Chemistries   Recent Labs Lab 06/08/15 0320 06/09/15 0300 06/10/15 0440 06/11/15 0519 06/12/15 0545  NA 137 138 136 135 135  K 4.6 3.8 4.0 3.9 3.9  CL 100* 101 98* 96* 95*  CO2 25 26 31 29 31   GLUCOSE 100* 112* 110* 97 126*  BUN 17 14 13 15 16   CREATININE 0.92 0.82 0.92 0.75 0.79  CALCIUM 8.8* 8.6* 8.9 9.1 9.1    CBG:  Recent Labs Lab 06/07/15 0809 06/08/15 0847 06/09/15 0726 06/10/15 0917 06/11/15 0752  GLUCAP 96 97 118* 84 85    GFR Estimated Creatinine Clearance: 49.9 mL/min (by C-G formula based on Cr of 0.79).  Coagulation profile  Recent Labs Lab 06/08/15 0320 06/09/15 0300 06/10/15 0440 06/11/15 0519 06/12/15 0545  INR 2.27* 2.57* 2.16* 2.04* 1.80*    Cardiac Enzymes No results for input(s): CKMB, TROPONINI, MYOGLOBIN in the last 168 hours.  Invalid input(s): CK  Invalid input(s): POCBNP No results for input(s): DDIMER in the last 72 hours. No results for  input(s): HGBA1C in the last 72 hours. No results for input(s): CHOL, HDL, LDLCALC, TRIG, CHOLHDL, LDLDIRECT in the last 72 hours. No results for input(s): TSH, T4TOTAL, T3FREE, THYROIDAB in the last 72 hours.  Invalid input(s): FREET3 No results for input(s): VITAMINB12, FOLATE, FERRITIN, TIBC, IRON, RETICCTPCT in the last 72 hours. No results for input(s): LIPASE, AMYLASE in the last 72 hours.  Urine Studies No results for input(s): UHGB, CRYS in the last 72 hours.  Invalid input(s): UACOL, UAPR, USPG, UPH, UTP, UGL, UKET, UBIL, UNIT, UROB, ULEU, UEPI, UWBC, URBC, UBAC, CAST, UCOM, BILUA  MICROBIOLOGY: Recent Results (from the past 240 hour(s))  Blood culture (routine x 2)     Status: None   Collection Time: 06/04/15 10:40 PM  Result Value Ref Range Status   Specimen Description BLOOD LEFT ANTECUBITAL  Final   Special Requests BOTTLES DRAWN AEROBIC AND ANAEROBIC 5CC  Final   Culture  Setup Time   Final    GRAM POSITIVE COCCI IN CLUSTERS IN BOTH AEROBIC AND ANAEROBIC BOTTLES CRITICAL RESULT CALLED TO, READ BACK BY AND VERIFIED WITH: K MOON 06/05/15 @ 1447 M VESTAL GRAM NEGATIVE RODS ANAEROBIC BOTTLE ONLY CRITICAL RESULT CALLED TO, READ BACK BY AND VERIFIED WITH: RN M LESSARD AT 1741 [id number] MARTINB CONFIRMED BY VINCE W    Culture   Final    STAPHYLOCOCCUS AUREUS KLEBSIELLA SPECIES SUSCEPTIBILITIES PERFORMED ON PREVIOUS CULTURE WITHIN THE LAST 5 DAYS.    Report Status 06/07/2015 FINAL  Final  Blood culture (routine x 2)     Status: None   Collection Time: 06/04/15 10:45 PM  Result Value Ref Range Status   Specimen Description BLOOD LEFT HAND  Final   Special Requests BOTTLES DRAWN AEROBIC AND ANAEROBIC 5CC  Final   Culture  Setup Time   Final    GRAM POSITIVE COCCI IN CLUSTERS IN BOTH AEROBIC AND ANAEROBIC BOTTLES CRITICAL RESULT CALLED TO, READ BACK BY AND VERIFIED WITH: K MOON 06/05/15 @ 1447 M VESTAL GRAM NEGATIVE RODS ANAEROBIC BOTTLE ONLY CRITICAL  RESULT CALLED TO,  READ BACK BY AND VERIFIED WITH: RN Tilden Fossa AT 1741 [id number] MARTINB CONFIRMED BY VINCE W    Culture STAPHYLOCOCCUS AUREUS KLEBSIELLA SPECIES   Final   Report Status 06/07/2015 FINAL  Final   Organism ID, Bacteria STAPHYLOCOCCUS AUREUS  Final   Organism ID, Bacteria KLEBSIELLA SPECIES  Final      Susceptibility   Staphylococcus aureus - MIC*    CIPROFLOXACIN <=0.5 SENSITIVE Sensitive     ERYTHROMYCIN <=0.25 SENSITIVE Sensitive     GENTAMICIN <=0.5 SENSITIVE Sensitive     OXACILLIN <=0.25 SENSITIVE Sensitive     TETRACYCLINE <=1 SENSITIVE Sensitive     VANCOMYCIN <=0.5 SENSITIVE Sensitive     TRIMETH/SULFA <=10 SENSITIVE Sensitive     CLINDAMYCIN <=0.25 SENSITIVE Sensitive     RIFAMPIN <=0.5 SENSITIVE Sensitive     Inducible Clindamycin NEGATIVE Sensitive     * STAPHYLOCOCCUS AUREUS   Klebsiella species - MIC*    AMPICILLIN >=32 RESISTANT Resistant     CEFAZOLIN >=64 RESISTANT Resistant     CEFEPIME <=1 SENSITIVE Sensitive     CEFTAZIDIME <=1 SENSITIVE Sensitive     CEFTRIAXONE <=1 SENSITIVE Sensitive     CIPROFLOXACIN <=0.25 SENSITIVE Sensitive     GENTAMICIN <=1 SENSITIVE Sensitive     IMIPENEM <=0.25 SENSITIVE Sensitive     TRIMETH/SULFA <=20 SENSITIVE Sensitive     AMPICILLIN/SULBACTAM 4 SENSITIVE Sensitive     PIP/TAZO <=4 SENSITIVE Sensitive     * KLEBSIELLA SPECIES  MRSA PCR Screening     Status: None   Collection Time: 06/05/15  5:51 PM  Result Value Ref Range Status   MRSA by PCR NEGATIVE NEGATIVE Final    Comment:        The GeneXpert MRSA Assay (FDA approved for NASAL specimens only), is one component of a comprehensive MRSA colonization surveillance program. It is not intended to diagnose MRSA infection nor to guide or monitor treatment for MRSA infections.   Culture, blood (routine x 2)     Status: None   Collection Time: 06/05/15  6:06 PM  Result Value Ref Range Status   Specimen Description BLOOD LEFT ANTECUBITAL  Final   Special Requests BOTTLES  DRAWN AEROBIC AND ANAEROBIC 5CC  Final   Culture NO GROWTH 5 DAYS  Final   Report Status 06/10/2015 FINAL  Final  Culture, blood (routine x 2)     Status: None   Collection Time: 06/05/15  6:08 PM  Result Value Ref Range Status   Specimen Description BLOOD LEFT HAND  Final   Special Requests BOTTLES DRAWN AEROBIC AND ANAEROBIC 5CC  Final   Culture NO GROWTH 5 DAYS  Final   Report Status 06/10/2015 FINAL  Final    RADIOLOGY STUDIES/RESULTS: Ct Chest Wo Contrast  06/09/2015  CLINICAL DATA:  Lung nodule. Hx HTN, CHF, NICM, and pericardial effusion. EXAM: CT CHEST WITHOUT CONTRAST TECHNIQUE: Multidetector CT imaging of the chest was performed following the standard protocol without IV contrast. COMPARISON:  06/04/2015, 07/24/2014 FINDINGS: Heart: There is a large pericardial effusion, measuring 3.5 cm adjacent to the right atrium. Aortic valve calcification. No significant coronary artery calcification. Vascular structures: There is mild atherosclerotic calcification of the thoracic aorta. No associated aneurysm. Pulmonary arteries have a normal noncontrast appearance. Mediastinum/thyroid: The visualized portion of the thyroid gland has a normal appearance. No mediastinal, hilar, or axillary adenopathy. Lungs/Airways: There are bilateral pleural effusions, right greater than left. Associated bilateral lower lobe infiltrates are also noted, right greater than left.  Bibasilar calcifications are noted in the lung parenchyma, of indeterminate significance and stable since prior study. Adjacent to the left major fissure there is a small opacity which measures 8 x 5 mm and contains a central lucency. Upper abdomen: There are parenchymal calcifications within the kidneys bilaterally. Chest wall/osseous structures: Midthoracic spondylosis. IMPRESSION: 1. Large pericardial effusion. 2. Small nodule within the left major fissure, with diameter of 7 mm. This is favored be benign, possibly related to pleural effusion,  a pseudo tumor. Follow-up CT of the chest is recommended in 6 months to document resolution. 3. Small bilateral pleural effusions, right greater than left. 4. Right greater than left lower lobe consolidation. Electronically Signed   By: Norva Pavlov M.D.   On: 06/09/2015 14:00   Mr Cervical Spine Wo Contrast  06/08/2015  CLINICAL DATA:  Back pain, worsening recently. EXAM: MRI CERVICAL SPINE WITHOUT CONTRAST TECHNIQUE: Multiplanar, multisequence MR imaging of the cervical spine was performed. No intravenous contrast was administered. COMPARISON:  None. FINDINGS: Foramen magnum is widely patent. C1-2 is normal. 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 C3-4: Spondylosis with endplate osteophytes and bulging of the disc. Facet arthropathy left worse than right with some edema of the facet joint on the left. Narrowing of the subarachnoid space surrounding the cord. AP diameter of the canal only 7 mm at this level. Mild deformity of the cord. Foraminal stenosis bilaterally because of osteophytic encroachment, left worse than right because of the facet arthropathy. C4-5: Spondylosis with endplate osteophytes and bulging of the disc. Effacement of the subarachnoid space surrounding the cord with slight cord deformity. AP diameter of the canal only 7 mm. Foraminal stenosis bilaterally because of osteophytic encroachment. C5-6: Spondylosis with endplate osteophytes and bulging disc material more pronounced towards the right. Effacement of the subarachnoid space surrounding the cord. AP diameter of the canal is 9 mm. Foraminal stenosis on the right that could compress the C6 nerve root. C6-7: Spondylosis with endplate osteophytes and bulging of the disc. Narrowing of the subarachnoid space surrounding the cord. AP diameter of the canal measures 9 mm. Foraminal stenosis right worse than left that could cause neural compression. C7-T1: Spondylosis with endplate osteophytes and bulging of the disc.  No compressive narrowing of the central canal. Mild foraminal narrowing on the right and moderate foraminal narrowing on the left. No abnormal cord edema. 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 Canal stenosis at C3-4 and C4-5 with AP diameter of canal only 7 mm at those levels. Pronounced facet arthropathy on the left at C3-4 with edema. This could be a cause of left-sided neck pain. Foraminal stenosis on the left at C3-4 could cause neural compression. Foraminal stenosis bilaterally at C4-5 could cause neural compression. Spondylosis at C5-6 and C6-7 with canal narrowing to a diameter of 9 mm. Foraminal stenosis bilaterally at those levels that could compress either or both C6 or C7 nerve roots. Foraminal narrowing on the left at C7-T1 that could be symptomatic. Electronically Signed   By: Paulina Fusi M.D.   On: 06/08/2015 18:35   Mr Lumbar Spine Wo Contrast  06/08/2015  CLINICAL DATA:  Back pain, acute worsening. EXAM: MRI LUMBAR SPINE WITHOUT CONTRAST TECHNIQUE: Multiplanar, multisequence MR imaging of the lumbar spine was performed. No intravenous contrast was administered. COMPARISON:  CT abdomen 06/05/2015 FINDINGS: Sagittal STIR imaging was performed. We are asked to abbreviated the study by the clinical service. There are non-compressive disc bulges at L2-3,  L3-4, L4-5 and L5-S1. There is facet arthropathy at L4-5 and L5-S1 with 2 mm of anterolisthesis at L4-5. There is not appear to be any compressive stenosis of the central canal. Lower lumbar facet arthropathy could contribute to back pain, but this would likely be chronic. 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 The facet arthropathy could be associated with low back pain. Electronically Signed   By: Paulina Fusi M.D.   On: 06/08/2015 18:37   Mr Thoracic Spine W  Wo Contrast  06/08/2015  CLINICAL DATA:  Severe back pain with acute worsening. EXAM: MRI THORACIC SPINE WITHOUT AND WITH CONTRAST TECHNIQUE: Multiplanar and multiecho pulse sequences of the thoracic spine were obtained without and with intravenous contrast. CONTRAST:  10mL MULTIHANCE GADOBENATE DIMEGLUMINE 529 MG/ML IV SOLN COMPARISON:  None. FINDINGS: T1-2:  Normal. T2-3:  Bulging of the disc.  No compressive stenosis. T3-4 through T10-11: Normal. No disc pathology. No fracture or focal bone lesion. The patient does have a layering pleural effusion on the right. T11-12: Spondyloarthropathy at this level. Mild edema within the disc space and of the adjacent endplates at T11 and T12. The surrounding soft tissues appear to show edematous change. This appearance could be due to single level degenerative spondyloarthropathy, but the possibility of discitis osteomyelitis certainly exists on the basis of the imaging. 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 Edema within the disc and within the T11 and T12 vertebral bodies. Surrounding edematous change in the paraspinous soft tissues. No evidence of epidural collection or neural compression. Sterile degenerative spondylosis can have this appearance, particularly in dialysis patients, but there is considerable concern for infection in this case. Electronically Signed   By: Paulina Fusi M.D.   On: 06/08/2015 18:42   Ct Abdomen Pelvis W Contrast  06/05/2015  CLINICAL DATA:  Back spasms.  Leukocytosis. EXAM: CT ABDOMEN AND PELVIS WITH CONTRAST TECHNIQUE: Multidetector CT imaging of the abdomen and pelvis was performed using the standard protocol following bolus administration of intravenous contrast. CONTRAST:  75mL OMNIPAQUE IOHEXOL 300 MG/ML  SOLN COMPARISON:  None. FINDINGS: Lower chest and abdominal wall: Chronic cardiomegaly. Remote subendocardial left ventricular infarct, greatest along the anterior wall. Chronic large  pericardial effusion when compared to previous CT. Chronic small right pleural effusion with atelectasis or scar. Hepatobiliary: Liver enhancement pattern suggests passive congestion. No focal lesion.Cholelithiasis without evidence of acute cholecystitis. No duct enlargement Pancreas: Unremarkable. Spleen: Unremarkable. Adrenals/Urinary Tract: Negative adrenals. Bilateral renal cortical thinning. Small nonobstructive bilateral renal calculi. Renal sinus cysts. Chronic hemorrhagic or proteinaceous cyst in the upper pole left kidney measuring 13 mm, also seen on April 2016 chest CT. Unremarkable decompressed bladder. Reproductive:Multiple uterine fibroids measuring up to 23 mm. Stomach/Bowel: No obstruction. Colonic diverticulosis without active inflammation. No evidence of appendicitis. Vascular/Lymphatic: No acute vascular abnormality. No mass or adenopathy. Peritoneal: Small ascites Musculoskeletal: No acute abnormalities. IMPRESSION: 1. No acute finding. 2. Remote left ventricular infarct with chronic cardiomegaly. Chronic large pericardial effusion and small right pleural effusion. 3. Passive congestion of the liver. 4. Cholelithiasis and nephrolithiasis. 5. Colonic diverticulosis. Electronically Signed   By: Marnee Spring M.D.   On: 06/05/2015 01:33   Ir US Guide Vasc Access Right  06/10/2015  CLINICAL DATA:  Chronic systolic CHF, needs long-term venous access for IV milrinone. Recent bacteremia. EXAM: TUNNELED CENTRAL VENOUS CATHETER PLACEMENT WITH ULTRASOUND AND FLUOROSCOPIC GUIDANCE TECHNIQUE: The procedure, risks, benefits,  and alternatives were explained to the patient. Questions regarding the procedure were encouraged and answered. The patient understands and consents to the procedure. Patient was already receiving adequate antibiotic coverage.Patency of the right IJ vein was confirmed with ultrasound with image documentation. An appropriate skin site was determined. Region was prepped using maximum  barrier technique including cap and mask, sterile gown, sterile gloves, large sterile sheet, and Chlorhexidine as cutaneous antisepsis. The region was infiltrated locally with 1% lidocaine. Under real-time ultrasound guidance, the right IJ vein was accessed with a 21 gauge micropuncture needle; the needle tip within the vein was confirmed with ultrasound image documentation. 73F dual-lumen cuffed powerPICC tunneled from a right anterior chest wall approach to the dermatotomy site. Needle exchanged over the 018 guidewire for transitional dilator, through which the catheter which had been cut to 21 cm was advanced under intermittent fluoroscopy, positioned with its tip at the cavoatrial junction. Spot chest radiograph confirms good catheter position. No pneumothorax. Catheter was flushed per protocol. Catheter secured externally with O Prolene suture. The right IJ dermatotomy site was closed with Dermabond. COMPLICATIONS: COMPLICATIONS None immediate FLUOROSCOPY TIME:  0.1 minute, 4.9 uGym2 DAP COMPARISON:  None IMPRESSION: 1. Technically successful placement of tunneled right IJ tunneled dual-lumen power injectable catheter with ultrasound and fluoroscopic guidance. Ready for routine use. Electronically Signed   By: Corlis Leak M.D.   On: 06/10/2015 12:35   Dg Chest Port 1 View  06/04/2015  CLINICAL DATA:  Shortness of breath EXAM: PORTABLE CHEST 1 VIEW COMPARISON:  03/22/2015 chest radiograph. FINDINGS: Right internal jugular central venous catheter terminates at the cavoatrial junction. Stable cardiomediastinal silhouette with moderate cardiomegaly. No pneumothorax. Small bilateral pleural effusions. Mild pulmonary edema. Mild bibasilar atelectasis. There is a new subcentimeter nodular density in the peripheral left mid lung. IMPRESSION: 1. Moderate cardiomegaly and mild pulmonary edema, in keeping with mild congestive heart failure. 2. Small bilateral pleural effusions and mild bibasilar atelectasis. 3. New  subcentimeter nodular density in the peripheral left mid lung, cannot exclude a pulmonary nodule. Recommend correlation with chest CT on a short term outpatient basis. Electronically Signed   By: Delbert Phenix M.D.   On: 06/04/2015 20:44   Ir Fluoro Guide Cv Midline Picc Right  06/10/2015  CLINICAL DATA:  Chronic systolic CHF, needs long-term venous access for IV milrinone. Recent bacteremia. EXAM: TUNNELED CENTRAL VENOUS CATHETER PLACEMENT WITH ULTRASOUND AND FLUOROSCOPIC GUIDANCE TECHNIQUE: The procedure, risks, benefits, and alternatives were explained to the patient. Questions regarding the procedure were encouraged and answered. The patient understands and consents to the procedure. Patient was already receiving adequate antibiotic coverage.Patency of the right IJ vein was confirmed with ultrasound with image documentation. An appropriate skin site was determined. Region was prepped using maximum barrier technique including cap and mask, sterile gown, sterile gloves, large sterile sheet, and Chlorhexidine as cutaneous antisepsis. The region was infiltrated locally with 1% lidocaine. Under real-time ultrasound guidance, the right IJ vein was accessed with a 21 gauge micropuncture needle; the needle tip within the vein was confirmed with ultrasound image documentation. 73F dual-lumen cuffed powerPICC tunneled from a right anterior chest wall approach to the dermatotomy site. Needle exchanged over the 018 guidewire for transitional dilator, through which the catheter which had been cut to 21 cm was advanced under intermittent fluoroscopy, positioned with its tip at the cavoatrial junction. Spot chest radiograph confirms good catheter position. No pneumothorax. Catheter was flushed per protocol. Catheter secured externally with O Prolene suture. The right IJ dermatotomy site was  closed with Dermabond. COMPLICATIONS: COMPLICATIONS None immediate FLUOROSCOPY TIME:  0.1 minute, 4.9 uGym2 DAP COMPARISON:  None  IMPRESSION: 1. Technically successful placement of tunneled right IJ tunneled dual-lumen power injectable catheter with ultrasound and fluoroscopic guidance. Ready for routine use. Electronically Signed   By: Corlis Leak M.D.   On: 06/10/2015 12:35    Leroy Sea, MD  [facility] Pager: [phone]  If 7PM-7AM, please contact night-coverage [email or web address] Password [email or web address] 06/12/2015, 1:25 PM   LOS: 7 days

## 2015-06-12 NOTE — Progress Notes (Signed)
Patient ID: ARDEN AXON, female   DOB: December 23, 1945, 70 y.o.   MRN: [id number]     SUBJECTIVE: Denies CP; mild dyspnea; complains of back pain  Cultures with MSSA and Klebsiella.    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 amiodarone  100 mg Oral Daily  . cefTRIAXone (ROCEPHIN)  IV  2 g Intravenous Q24H  . digoxin  0.0625 mg Oral Daily  . feeding supplement  1 Container Oral TID BM  . magnesium oxide  200 mg Oral BID  . polyethylene glycol  17 g Oral Daily  . sodium chloride flush  3 mL Intravenous Q12H  . spironolactone  12.5 mg Oral Daily  . torsemide  20 mg Oral Daily  . warfarin  7.5 mg Oral ONCE-1800  . Warfarin - Pharmacist Dosing Inpatient   Does not apply q1800   Continuous Infusions: . milrinone 0.25 mcg/kg/min (06/11/15 1654)   PRN Meds:.acetaminophen **OR** acetaminophen, HYDROmorphone (DILAUDID) injection, methocarbamol, morphine injection, oxyCODONE-acetaminophen, sodium chloride flush    Filed Vitals:   06/11/15 1500 06/11/15 1616 06/11/15 2037 06/12/15 0427  BP: 104/70 102/70 105/69 118/65  Pulse: 69 75  70  Temp:  98.6 F (37 C) 98.2 F (36.8 C) 97.9 F (36.6 C)  TempSrc:  Oral Oral Oral  Resp:  11 16 18   Height:  5\' 2"  (1.575 m)    Weight:  103 lb 2.8 oz (46.8 kg)  103 lb 9.9 oz (47 kg)  SpO2: 100% 97% 100% 100%    Intake/Output Summary (Last 24 hours) at 06/12/15 0802 Last data filed at 06/12/15 0500  Gross per 24 hour  Intake  123.5 ml  Output    325 ml  Net -201.5 ml    LABS: Basic Metabolic Panel:  Recent Labs  40/98/11 0519 06/12/15 0545  NA 135 135  K 3.9 3.9  CL 96* 95*  CO2 29 31  GLUCOSE 97 126*  BUN 15 16  CREATININE 0.75 0.79  CALCIUM 9.1 9.1   CBC:  Recent Labs  06/11/15 0519 06/12/15 0545  WBC 9.9 8.8  NEUTROABS 7.5 6.6  HGB 10.4* 10.3*  HCT 33.5* 33.0*  MCV 84.6  84.4  PLT 338 348    RADIOLOGY: Ct Chest Wo Contrast  06/09/2015  CLINICAL DATA:  Lung nodule. Hx HTN, CHF, NICM, and pericardial effusion. EXAM: CT CHEST WITHOUT CONTRAST TECHNIQUE: Multidetector CT imaging of the chest was performed following the standard protocol without IV contrast. COMPARISON:  06/04/2015, 07/24/2014 FINDINGS: Heart: There is a large pericardial effusion, measuring 3.5 cm adjacent to the right atrium. Aortic valve calcification. No significant coronary artery calcification. Vascular structures: There is mild atherosclerotic calcification of the thoracic aorta. No associated aneurysm. Pulmonary arteries have a normal noncontrast appearance. Mediastinum/thyroid: The visualized portion of the thyroid gland has a normal appearance. No mediastinal, hilar, or axillary adenopathy. Lungs/Airways: There are bilateral pleural effusions, right greater than left. Associated bilateral lower lobe infiltrates are also noted, right greater than left. Bibasilar calcifications are noted in the lung parenchyma, of indeterminate significance and stable since prior study. Adjacent to the left major fissure there is a small opacity which measures 8 x 5 mm and contains a central lucency. Upper abdomen: There are parenchymal calcifications within the kidneys bilaterally. Chest wall/osseous structures: Midthoracic spondylosis. IMPRESSION: 1. Large pericardial effusion. 2. Small nodule within the left major fissure, with diameter  of 7 mm. This is favored be benign, possibly related to pleural effusion, a pseudo tumor. Follow-up CT of the chest is recommended in 6 months to document resolution. 3. Small bilateral pleural effusions, right greater than left. 4. Right greater than left lower lobe consolidation. Electronically Signed   By: Norva Pavlov M.D.   On: 06/09/2015 14:00   Mr Cervical Spine Wo Contrast  06/08/2015  CLINICAL DATA:  Back pain, worsening recently. EXAM: MRI CERVICAL SPINE WITHOUT CONTRAST  TECHNIQUE: Multiplanar, multisequence MR imaging of the cervical spine was performed. No intravenous contrast was administered. COMPARISON:  None. FINDINGS: Foramen magnum is widely patent. C1-2 is normal. 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 C3-4: Spondylosis with endplate osteophytes and bulging of the disc. Facet arthropathy left worse than right with some edema of the facet joint on the left. Narrowing of the subarachnoid space surrounding the cord. AP diameter of the canal only 7 mm at this level. Mild deformity of the cord. Foraminal stenosis bilaterally because of osteophytic encroachment, left worse than right because of the facet arthropathy. C4-5: Spondylosis with endplate osteophytes and bulging of the disc. Effacement of the subarachnoid space surrounding the cord with slight cord deformity. AP diameter of the canal only 7 mm. Foraminal stenosis bilaterally because of osteophytic encroachment. C5-6: Spondylosis with endplate osteophytes and bulging disc material more pronounced towards the right. Effacement of the subarachnoid space surrounding the cord. AP diameter of the canal is 9 mm. Foraminal stenosis on the right that could compress the C6 nerve root. C6-7: Spondylosis with endplate osteophytes and bulging of the disc. Narrowing of the subarachnoid space surrounding the cord. AP diameter of the canal measures 9 mm. Foraminal stenosis right worse than left that could cause neural compression. C7-T1: Spondylosis with endplate osteophytes and bulging of the disc. No compressive narrowing of the central canal. Mild foraminal narrowing on the right and moderate foraminal narrowing on the left. No abnormal cord edema. 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 Canal stenosis at C3-4 and C4-5 with AP diameter of canal only 7 mm at those levels. Pronounced facet arthropathy on the left at C3-4 with edema. This could be  a cause of left-sided neck pain. Foraminal stenosis on the left at C3-4 could cause neural compression. Foraminal stenosis bilaterally at C4-5 could cause neural compression. Spondylosis at C5-6 and C6-7 with canal narrowing to a diameter of 9 mm. Foraminal stenosis bilaterally at those levels that could compress either or both C6 or C7 nerve roots. Foraminal narrowing on the left at C7-T1 that could be symptomatic. Electronically Signed   By: Paulina Fusi M.D.   On: 06/08/2015 18:35   Mr Lumbar Spine Wo Contrast  06/08/2015  CLINICAL DATA:  Back pain, acute worsening. EXAM: MRI LUMBAR SPINE WITHOUT CONTRAST TECHNIQUE: Multiplanar, multisequence MR imaging of the lumbar spine was performed. No intravenous contrast was administered. COMPARISON:  CT abdomen 06/05/2015 FINDINGS: Sagittal STIR imaging was performed. We are asked to abbreviated the study by the clinical service. There are non-compressive disc bulges at L2-3, L3-4, L4-5 and L5-S1. There is facet arthropathy at L4-5 and L5-S1 with 2 mm of anterolisthesis at L4-5. There is not appear to be any compressive stenosis of the central canal. Lower lumbar facet arthropathy could contribute to back pain, but this would likely be chronic. 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 The facet arthropathy could be associated with low back pain. Electronically Signed   By: Paulina Fusi M.D.   On: 06/08/2015 18:37   Mr Thoracic Spine W Wo Contrast  06/08/2015  CLINICAL DATA:  Severe back pain with acute worsening. EXAM: MRI THORACIC SPINE WITHOUT AND WITH CONTRAST TECHNIQUE: Multiplanar and multiecho pulse sequences of the thoracic spine were obtained without and with intravenous contrast. CONTRAST:  57mL MULTIHANCE GADOBENATE DIMEGLUMINE 529 MG/ML IV SOLN COMPARISON:  None. FINDINGS: T1-2:  Normal. T2-3:  Bulging of the disc.  No compressive  stenosis. T3-4 through T10-11: Normal. No disc pathology. No fracture or focal bone lesion. The patient does have a layering pleural effusion on the right. T11-12: Spondyloarthropathy at this level. Mild edema within the disc space and of the adjacent endplates at T11 and T12. The surrounding soft tissues appear to show edematous change. This appearance could be due to single level degenerative spondyloarthropathy, but the possibility of discitis osteomyelitis certainly exists on the basis of the imaging. 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 Edema within the disc and within the T11 and T12 vertebral bodies. Surrounding edematous change in the paraspinous soft tissues. No evidence of epidural collection or neural compression. Sterile degenerative spondylosis can have this appearance, particularly in dialysis patients, but there is considerable concern for infection in this case. Electronically Signed   By: Paulina Fusi M.D.   On: 06/08/2015 18:42   Ct Abdomen Pelvis W Contrast  06/05/2015  CLINICAL DATA:  Back spasms.  Leukocytosis. EXAM: CT ABDOMEN AND PELVIS WITH CONTRAST TECHNIQUE: Multidetector CT imaging of the abdomen and pelvis was performed using the standard protocol following bolus administration of intravenous contrast. CONTRAST:  28mL OMNIPAQUE IOHEXOL 300 MG/ML  SOLN COMPARISON:  None. FINDINGS: Lower chest and abdominal wall: Chronic cardiomegaly. Remote subendocardial left ventricular infarct, greatest along the anterior wall. Chronic large pericardial effusion when compared to previous CT. Chronic small right pleural effusion with atelectasis or scar. Hepatobiliary: Liver enhancement pattern suggests passive congestion. No focal lesion.Cholelithiasis without evidence of acute cholecystitis. No duct enlargement Pancreas: Unremarkable. Spleen: Unremarkable. Adrenals/Urinary Tract: Negative adrenals. Bilateral renal cortical thinning. Small nonobstructive  bilateral renal calculi. Renal sinus cysts. Chronic hemorrhagic or proteinaceous cyst in the upper pole left kidney measuring 13 mm, also seen on April 2016 chest CT. Unremarkable decompressed bladder. Reproductive:Multiple uterine fibroids measuring up to 23 mm. Stomach/Bowel: No obstruction. Colonic diverticulosis without active inflammation. No evidence of appendicitis. Vascular/Lymphatic: No acute vascular abnormality. No mass or adenopathy. Peritoneal: Small ascites Musculoskeletal: No acute abnormalities. IMPRESSION: 1. No acute finding. 2. Remote left ventricular infarct with chronic cardiomegaly. Chronic large pericardial effusion and small right pleural effusion. 3. Passive congestion of the liver. 4. Cholelithiasis and nephrolithiasis. 5. Colonic diverticulosis. Electronically Signed   By: Marnee Spring M.D.   On: 06/05/2015 01:33   Ir US Guide Vasc Access Right  06/10/2015  CLINICAL DATA:  Chronic systolic CHF, needs long-term venous access for IV milrinone. Recent bacteremia. EXAM: TUNNELED CENTRAL VENOUS CATHETER PLACEMENT WITH ULTRASOUND AND FLUOROSCOPIC GUIDANCE TECHNIQUE: The procedure, risks, benefits, and alternatives were explained to the patient. Questions regarding the procedure were encouraged and answered. The patient understands and consents to the procedure. Patient was already receiving adequate antibiotic coverage.Patency of the right IJ vein was confirmed with ultrasound with image documentation. An appropriate skin site was determined. Region was prepped using maximum barrier technique including cap and mask, sterile gown, sterile gloves, large sterile sheet, and Chlorhexidine  as cutaneous antisepsis. The region was infiltrated locally with 1% lidocaine. Under real-time ultrasound guidance, the right IJ vein was accessed with a 21 gauge micropuncture needle; the needle tip within the vein was confirmed with ultrasound image documentation. 71F dual-lumen cuffed powerPICC tunneled from  a right anterior chest wall approach to the dermatotomy site. Needle exchanged over the 018 guidewire for transitional dilator, through which the catheter which had been cut to 21 cm was advanced under intermittent fluoroscopy, positioned with its tip at the cavoatrial junction. Spot chest radiograph confirms good catheter position. No pneumothorax. Catheter was flushed per protocol. Catheter secured externally with O Prolene suture. The right IJ dermatotomy site was closed with Dermabond. COMPLICATIONS: COMPLICATIONS None immediate FLUOROSCOPY TIME:  0.1 minute, 4.9 uGym2 DAP COMPARISON:  None IMPRESSION: 1. Technically successful placement of tunneled right IJ tunneled dual-lumen power injectable catheter with ultrasound and fluoroscopic guidance. Ready for routine use. Electronically Signed   By: Corlis Leak M.D.   On: 06/10/2015 12:35   Dg Chest Port 1 View  06/04/2015  CLINICAL DATA:  Shortness of breath EXAM: PORTABLE CHEST 1 VIEW COMPARISON:  03/22/2015 chest radiograph. FINDINGS: Right internal jugular central venous catheter terminates at the cavoatrial junction. Stable cardiomediastinal silhouette with moderate cardiomegaly. No pneumothorax. Small bilateral pleural effusions. Mild pulmonary edema. Mild bibasilar atelectasis. There is a new subcentimeter nodular density in the peripheral left mid lung. IMPRESSION: 1. Moderate cardiomegaly and mild pulmonary edema, in keeping with mild congestive heart failure. 2. Small bilateral pleural effusions and mild bibasilar atelectasis. 3. New subcentimeter nodular density in the peripheral left mid lung, cannot exclude a pulmonary nodule. Recommend correlation with chest CT on a short term outpatient basis. Electronically Signed   By: Delbert Phenix M.D.   On: 06/04/2015 20:44   Ir Fluoro Guide Cv Midline Picc Right  06/10/2015  CLINICAL DATA:  Chronic systolic CHF, needs long-term venous access for IV milrinone. Recent bacteremia. EXAM: TUNNELED CENTRAL VENOUS  CATHETER PLACEMENT WITH ULTRASOUND AND FLUOROSCOPIC GUIDANCE TECHNIQUE: The procedure, risks, benefits, and alternatives were explained to the patient. Questions regarding the procedure were encouraged and answered. The patient understands and consents to the procedure. Patient was already receiving adequate antibiotic coverage.Patency of the right IJ vein was confirmed with ultrasound with image documentation. An appropriate skin site was determined. Region was prepped using maximum barrier technique including cap and mask, sterile gown, sterile gloves, large sterile sheet, and Chlorhexidine as cutaneous antisepsis. The region was infiltrated locally with 1% lidocaine. Under real-time ultrasound guidance, the right IJ vein was accessed with a 21 gauge micropuncture needle; the needle tip within the vein was confirmed with ultrasound image documentation. 71F dual-lumen cuffed powerPICC tunneled from a right anterior chest wall approach to the dermatotomy site. Needle exchanged over the 018 guidewire for transitional dilator, through which the catheter which had been cut to 21 cm was advanced under intermittent fluoroscopy, positioned with its tip at the cavoatrial junction. Spot chest radiograph confirms good catheter position. No pneumothorax. Catheter was flushed per protocol. Catheter secured externally with O Prolene suture. The right IJ dermatotomy site was closed with Dermabond. COMPLICATIONS: COMPLICATIONS None immediate FLUOROSCOPY TIME:  0.1 minute, 4.9 uGym2 DAP COMPARISON:  None IMPRESSION: 1. Technically successful placement of tunneled right IJ tunneled dual-lumen power injectable catheter with ultrasound and fluoroscopic guidance. Ready for routine use. Electronically Signed   By: Corlis Leak M.D.   On: 06/10/2015 12:35    PHYSICAL EXAM General: WD, frail, NAD Neck: Supple Heent: normal  Lungs: CTA anteriorly  CV: RRR Abdomen: Soft, NT, ND Neurologic: Grossly intact Extremities: No  edema  TELEMETRY: Reviewed, NSR  ASSESSMENT AND PLAN: 70 yo with nonischemic cardiomyopathy, chronic moderate pleural effusion, paroxysmal atrial fibrillation, and LV thrombus on home milrinone presented with severe back pain.  WBCs high, concern for tunneled catheter infection with possible spread cause discitis/back pain.     1. ID: MSSA and Klebsiella bacteremia, likely line sepsis.  Original line out.  ID following.  Remains afebrile, now on ceftriaxone.  TEE negative for vegetation or endocarditis.  MRI showed T11-T12 osteomyelitis.  Now has new tunneled catheter with repeat cultures negative.  - Ceftriaxone for 6 wks per ID.   2. Acute on chronic systolic CHF: Echo with EF 20%, moderate pericardial effusion.  She is milrinone dependent.  - Continue milrinone.  - Continue spironolactone and torsemide 20 mg daily.   - Continue digoxin.  - Would consider ACEI if BP allows next 24-48 hrs 3. LV mural thrombus: Continue coumadin.  4. Deconditioning: Needs work with PT 5. Lung nodule-fu chest ct 6 months    Olga Millers  06/12/2015 8:02 AM

## 2015-06-12 NOTE — NC FL2 (Signed)
[place] MEDICAID FL2 LEVEL OF CARE SCREENING TOOL     IDENTIFICATION  Patient Name: Katrina Peck Birthdate: 1945-11-23 Sex: female Admission Date (Current Location): 06/04/2015  [facility] and [place] Number:  Producer, television/film/video and Address:  [facility] [place]. [facility], [address] [place], [place] [address]      Provider Number: [id number]  Attending Physician Name and Address:  Leroy Sea, MD  Relative Name and Phone Number:  Ethelene Browns, spouse, [phone]    Current Level of Care: Hospital Recommended Level of Care: Skilled Nursing Facility Prior Approval Number:    Date Approved/Denied:   PASRR Number: [id number]  Discharge Plan: SNF    Current Diagnoses: Patient Active Problem List   Diagnosis Date Noted  . Lung nodule   . Discitis of thoracic region   . Malnutrition of moderate degree 06/07/2015  . Acute combined systolic and diastolic congestive heart failure (HCC)   . Leukocytosis   . Acute blood loss anemia   . Bacteremia   . Sepsis (HCC) 06/05/2015  . Back spasm 06/05/2015  . Back pain   . Neck pain   . Acute on chronic systolic CHF (congestive heart failure), NYHA class 4 (HCC)   . Central line infection   . Atrial fibrillation (HCC) [I48.91] 03/31/2015  . Long term (current) use of anticoagulants [Z79.01] 03/31/2015  . Encounter for therapeutic drug monitoring 03/31/2015  . Non compliance w medication regimen 03/28/2015  . Protein-calorie malnutrition, severe (HCC) 03/28/2015  . Acute on chronic systolic congestive heart failure (HCC)   . LV (left ventricular) mural thrombus (HCC)   . Acute systolic CHF (congestive heart failure) (HCC) 02/04/2015  . Acute on chronic systolic (congestive) heart failure (HCC) 02/04/2015  . Elevated troponin   . Acute on chronic systolic and diastolic heart failure, NYHA class 4 (HCC) 11/11/2014  . Paroxysmal atrial fibrillation (HCC) 09/29/2014  . Chronic systolic heart failure (HCC) 08/07/2014   . Acute systolic CHF (congestive heart failure), NYHA class 4 (HCC) 07/24/2014  . NICM (nonischemic cardiomyopathy) (HCC) 07/24/2014  . Essential hypertension 07/24/2014  . Pericardial effusion 07/24/2014  . Acute CHF (HCC) 07/20/2014    Orientation RESPIRATION BLADDER Height & Weight     Self, Time, Situation, Place  Normal Incontinent Weight: 47 kg (103 lb 9.9 oz) Height:   (157.5 cm)  BEHAVIORAL SYMPTOMS/MOOD NEUROLOGICAL BOWEL NUTRITION STATUS      Incontinent  (Please see DC summary)  AMBULATORY STATUS COMMUNICATION OF NEEDS Skin   Extensive Assist Verbally Normal                       Personal Care Assistance Level of Assistance  Bathing, Feeding, Dressing Bathing Assistance: Limited assistance Feeding assistance: Independent Dressing Assistance: Limited assistance     Functional Limitations Info             SPECIAL CARE FACTORS FREQUENCY  PT (By licensed PT)     PT Frequency: min 3x/week              Contractures      Additional Factors Info  Code Status, Allergies Code Status Info: Full Allergies Info: NKA           Current Medications (06/12/2015):  This is the current hospital active medication list Current Facility-Administered Medications  Medication Dose Route Frequency Provider Last Rate Last Dose  . acetaminophen (TYLENOL) tablet 650 mg  650 mg Oral Q6H PRN Lorretta Harp, MD  Or  . acetaminophen (TYLENOL) suppository 650 mg  650 mg Rectal Q6H PRN Lorretta Harp, MD      . amiodarone (PACERONE) tablet 100 mg  100 mg Oral Daily Lorretta Harp, MD   100 mg at 06/12/15 1100  . cefTRIAXone (ROCEPHIN) 2 g in dextrose 5 % 50 mL IVPB  2 g Intravenous Q24H Gardiner Barefoot, MD   2 g at 06/12/15 0150  . digoxin (LANOXIN) tablet 0.0625 mg  0.0625 mg Oral Daily Lorretta Harp, MD   0.0625 mg at 06/12/15 1100  . feeding supplement (BOOST / RESOURCE BREEZE) liquid 1 Container  1 Container Oral TID BM Maretta Bees, MD   1 Container at 06/10/15 1525  .  HYDROmorphone (DILAUDID) injection 0.5 mg  0.5 mg Intravenous Q4H PRN Maretta Bees, MD   0.5 mg at 06/11/15 1641  . magnesium oxide (MAG-OX) tablet 200 mg  200 mg Oral BID Lorretta Harp, MD   200 mg at 06/12/15 1100  . methocarbamol (ROBAXIN) tablet 750 mg  750 mg Oral Q6H PRN Laurey Morale, MD   750 mg at 06/11/15 0110  . milrinone (PRIMACOR) 20 MG/100ML (0.2 mg/mL) infusion  0.25 mcg/kg/min Intravenous Continuous Lorretta Harp, MD 3.5 mL/hr at 06/11/15 1654 0.25 mcg/kg/min at 06/11/15 1654  . morphine 2 MG/ML injection 1 mg  1 mg Intravenous Q4H PRN Lorretta Harp, MD   1 mg at 06/07/15 2216  . oxyCODONE-acetaminophen (PERCOCET/ROXICET) 5-325 MG per tablet 1 tablet  1 tablet Oral Q4H PRN Lorretta Harp, MD   1 tablet at 06/12/15 1600  . polyethylene glycol (MIRALAX / GLYCOLAX) packet 17 g  17 g Oral Daily Maretta Bees, MD   17 g at 06/11/15 1112  . sodium chloride flush (NS) 0.9 % injection 10-40 mL  10-40 mL Intracatheter PRN Lorretta Harp, MD   10 mL at 06/05/15 2149  . sodium chloride flush (NS) 0.9 % injection 3 mL  3 mL Intravenous Q12H Lorretta Harp, MD   3 mL at 06/11/15 2107  . spironolactone (ALDACTONE) tablet 12.5 mg  12.5 mg Oral Daily Laurey Morale, MD   12.5 mg at 06/12/15 1100  . torsemide (DEMADEX) tablet 20 mg  20 mg Oral Daily Laurey Morale, MD   20 mg at 06/12/15 1100  . warfarin (COUMADIN) tablet 7.5 mg  7.5 mg Oral ONCE-1800 Leroy Sea, MD      . Warfarin - Pharmacist Dosing Inpatient   Does not apply q1800 Titus Mould, [facility]         Discharge Medications: Please see discharge summary for a list of discharge medications.  Relevant Imaging Results:  Relevant Lab Results:   Additional Information SSN: [id number] [place], [place]

## 2015-06-12 NOTE — Progress Notes (Signed)
ANTICOAGULATION CONSULT NOTE - Follow Up Consult  Pharmacy Consult for warfarin Indication: Afib and history of LV thrombus  No Known Allergies  Patient Measurements: Height: 5\' 2"  (157.5 cm) Weight: 103 lb 9.9 oz (47 kg) IBW/kg (Calculated) : 50.1  Vital Signs: Temp: 97.9 F (36.6 C) (03/19 0427) Temp Source: Oral (03/19 0427) BP: 118/65 mmHg (03/19 0427) Pulse Rate: 70 (03/19 0427)  Labs:  Recent Labs  06/10/15 0440 06/11/15 0519 06/12/15 0545  HGB 10.4* 10.4* 10.3*  HCT 33.3* 33.5* 33.0*  PLT 293 338 348  LABPROT 23.9* 22.9* 20.9*  INR 2.16* 2.04* 1.80*  CREATININE 0.92 0.75 0.79    Estimated Creatinine Clearance: 49.9 mL/min (by C-G formula based on Cr of 0.79).   Assessment: 70 yo F admitted 06/04/2015 on chronic warfarin for history of Afib and LV thrombus. Patient missed dose on 3/15 but received dose on 3/16 and 3/17. INR 2.04> 1,8 fell on home dose and missed dose earlier this week - will boost today Hgb stable, Plt wnl. No s/sx of bleeding noted.  PTA warfarin dose: 4 mg MWF and 6 mg all other days    Goal of Therapy:  INR 2-3 Monitor platelets by anticoagulation protocol: Yes   Plan:  - Warfarin 7.5mg  PO x1 tonight - Monitor daily INR, CBC and s/sx of bleeding  Leota Sauers Pharm.D. CPP, BCPS Clinical Pharmacist [phone] 06/12/2015 7:51 AM

## 2015-06-12 NOTE — Progress Notes (Signed)
[facility] has been providing [facility] and [facility] for home Milrinone prior to this readmission.  [facility] inotrope team  will continue to provide [name]'s Milrinone at the SNF if the chosen SNF is an [facility] partner with inotrope therapy.  [facility] hospital team will follow [name] to support as needed at DC.  If patient discharges after hours, please call [phone].   Katrina Peck 06/12/2015, 10:35 PM

## 2015-06-13 DIAGNOSIS — M542 Cervicalgia: Secondary | ICD-10-CM | POA: Diagnosis not present

## 2015-06-13 DIAGNOSIS — I5043 Acute on chronic combined systolic (congestive) and diastolic (congestive) heart failure: Secondary | ICD-10-CM | POA: Diagnosis not present

## 2015-06-13 DIAGNOSIS — R911 Solitary pulmonary nodule: Secondary | ICD-10-CM | POA: Diagnosis not present

## 2015-06-13 DIAGNOSIS — M549 Dorsalgia, unspecified: Secondary | ICD-10-CM | POA: Diagnosis not present

## 2015-06-13 DIAGNOSIS — I429 Cardiomyopathy, unspecified: Secondary | ICD-10-CM | POA: Diagnosis not present

## 2015-06-13 DIAGNOSIS — I10 Essential (primary) hypertension: Secondary | ICD-10-CM | POA: Diagnosis not present

## 2015-06-13 DIAGNOSIS — R0602 Shortness of breath: Secondary | ICD-10-CM | POA: Diagnosis not present

## 2015-06-13 DIAGNOSIS — I5041 Acute combined systolic (congestive) and diastolic (congestive) heart failure: Secondary | ICD-10-CM | POA: Diagnosis not present

## 2015-06-13 DIAGNOSIS — R0989 Other specified symptoms and signs involving the circulatory and respiratory systems: Secondary | ICD-10-CM | POA: Diagnosis not present

## 2015-06-13 DIAGNOSIS — D649 Anemia, unspecified: Secondary | ICD-10-CM | POA: Diagnosis not present

## 2015-06-13 DIAGNOSIS — M4644 Discitis, unspecified, thoracic region: Secondary | ICD-10-CM | POA: Diagnosis not present

## 2015-06-13 DIAGNOSIS — E440 Moderate protein-calorie malnutrition: Secondary | ICD-10-CM | POA: Diagnosis not present

## 2015-06-13 DIAGNOSIS — I480 Paroxysmal atrial fibrillation: Secondary | ICD-10-CM | POA: Diagnosis not present

## 2015-06-13 DIAGNOSIS — R7881 Bacteremia: Secondary | ICD-10-CM | POA: Diagnosis not present

## 2015-06-13 DIAGNOSIS — I319 Disease of pericardium, unspecified: Secondary | ICD-10-CM | POA: Diagnosis not present

## 2015-06-13 DIAGNOSIS — E43 Unspecified severe protein-calorie malnutrition: Secondary | ICD-10-CM | POA: Diagnosis not present

## 2015-06-13 DIAGNOSIS — I5022 Chronic systolic (congestive) heart failure: Secondary | ICD-10-CM | POA: Diagnosis not present

## 2015-06-13 DIAGNOSIS — J189 Pneumonia, unspecified organism: Secondary | ICD-10-CM | POA: Diagnosis not present

## 2015-06-13 DIAGNOSIS — Z7901 Long term (current) use of anticoagulants: Secondary | ICD-10-CM | POA: Diagnosis not present

## 2015-06-13 DIAGNOSIS — M5441 Lumbago with sciatica, right side: Secondary | ICD-10-CM | POA: Diagnosis not present

## 2015-06-13 DIAGNOSIS — I4891 Unspecified atrial fibrillation: Secondary | ICD-10-CM | POA: Diagnosis not present

## 2015-06-13 DIAGNOSIS — I213 ST elevation (STEMI) myocardial infarction of unspecified site: Secondary | ICD-10-CM | POA: Diagnosis not present

## 2015-06-13 DIAGNOSIS — B999 Unspecified infectious disease: Secondary | ICD-10-CM | POA: Diagnosis not present

## 2015-06-13 DIAGNOSIS — D62 Acute posthemorrhagic anemia: Secondary | ICD-10-CM | POA: Diagnosis not present

## 2015-06-13 DIAGNOSIS — A419 Sepsis, unspecified organism: Secondary | ICD-10-CM | POA: Diagnosis not present

## 2015-06-13 LAB — PROTIME-INR
INR: 2.12 — ABNORMAL HIGH (ref 0.00–1.49)
Prothrombin Time: 23.5 seconds — ABNORMAL HIGH (ref 11.6–15.2)

## 2015-06-13 LAB — CBC WITH DIFFERENTIAL/PLATELET
Basophils Absolute: 0 10*3/uL (ref 0.0–0.1)
Basophils Relative: 0 %
EOS PCT: 2 %
Eosinophils Absolute: 0.1 10*3/uL (ref 0.0–0.7)
HEMATOCRIT: 34.4 % — AB (ref 36.0–46.0)
Hemoglobin: 10.7 g/dL — ABNORMAL LOW (ref 12.0–15.0)
LYMPHS ABS: 1.2 10*3/uL (ref 0.7–4.0)
LYMPHS PCT: 14 %
MCH: 26.4 pg (ref 26.0–34.0)
MCHC: 31.1 g/dL (ref 30.0–36.0)
MCV: 84.7 fL (ref 78.0–100.0)
MONO ABS: 0.6 10*3/uL (ref 0.1–1.0)
Monocytes Relative: 8 %
NEUTROS ABS: 6.3 10*3/uL (ref 1.7–7.7)
Neutrophils Relative %: 76 %
PLATELETS: 366 10*3/uL (ref 150–400)
RBC: 4.06 MIL/uL (ref 3.87–5.11)
RDW: 14.9 % (ref 11.5–15.5)
WBC: 8.2 10*3/uL (ref 4.0–10.5)

## 2015-06-13 LAB — BASIC METABOLIC PANEL
Anion gap: 12 (ref 5–15)
BUN: 16 mg/dL (ref 6–20)
CHLORIDE: 95 mmol/L — AB (ref 101–111)
CO2: 30 mmol/L (ref 22–32)
Calcium: 9.4 mg/dL (ref 8.9–10.3)
Creatinine, Ser: 0.77 mg/dL (ref 0.44–1.00)
GFR calc Af Amer: >60 mL/min (ref >60)
GFR calc non Af Amer: >60 mL/min (ref >60)
GLUCOSE: 121 mg/dL — AB (ref 65–99)
POTASSIUM: 3.7 mmol/L (ref 3.5–5.1)
Sodium: 137 mmol/L (ref 135–145)

## 2015-06-13 LAB — CBC AND DIFFERENTIAL: WBC: 8.2 10*3/mL

## 2015-06-13 MED ORDER — CEFTRIAXONE SODIUM 2 G IJ SOLR: 2.0000 g | INTRAMUSCULAR | Status: DC

## 2015-06-13 MED ORDER — OXYCODONE-ACETAMINOPHEN 5-325 MG PO TABS: 1.0000 | ORAL_TABLET | ORAL | Status: DC | PRN

## 2015-06-13 MED ORDER — POTASSIUM CHLORIDE CRYS ER 20 MEQ PO TBCR: 20.0000 meq | EXTENDED_RELEASE_TABLET | Freq: Once | ORAL | Status: DC

## 2015-06-13 MED ORDER — POTASSIUM CHLORIDE CRYS ER 20 MEQ PO TBCR
40.0000 meq | EXTENDED_RELEASE_TABLET | Freq: Every day | ORAL | Status: DC
  Administered 2015-06-13: 40 meq via ORAL
  Filled 2015-06-13: qty 2

## 2015-06-13 MED ORDER — WARFARIN SODIUM 5 MG PO TABS: 6.0000 mg | ORAL_TABLET | Freq: Once | ORAL | Status: DC

## 2015-06-13 NOTE — Progress Notes (Signed)
ANTICOAGULATION CONSULT NOTE - Follow Up Consult  Pharmacy Consult for warfarin Indication: Afib and history of LV thrombus  No Known Allergies  Patient Measurements: Height: 5\' 2"  (157.5 cm) Weight: 99 lb 13.9 oz (45.3 kg) IBW/kg (Calculated) : 50.1  Vital Signs: Temp: 98.2 F (36.8 C) (03/20 0421) Temp Source: Oral (03/20 0421) BP: 107/63 mmHg (03/20 0810) Pulse Rate: 65 (03/20 0810)  Labs:  Recent Labs  06/11/15 0519 06/12/15 0545 06/13/15 0509  HGB 10.4* 10.3* 10.7*  HCT 33.5* 33.0* 34.4*  PLT 338 348 366  LABPROT 22.9* 20.9* 23.5*  INR 2.04* 1.80* 2.12*  CREATININE 0.75 0.79 0.77    Estimated Creatinine Clearance: 48.1 mL/min (by C-G formula based on Cr of 0.77).   Assessment: 70 yo F admitted 06/04/2015 on chronic warfarin for history of Afib and LV thrombus. Patient missed dose on 3/15 but received dose on 3/16 and 3/17.  INR 2.04> 1,8 fell on home dose and missed dose earlier this week, back up to 2.12 today. Hgb stable, Plt wnl. No s/sx of bleeding noted.  PTA warfarin dose: 4 mg MWF and 6 mg all other days  Goal of Therapy:  INR 2-3 Monitor platelets by anticoagulation protocol: Yes   Plan:  - Warfarin 6 mg PO x1 tonight - Monitor daily INR, CBC and s/sx of bleeding  Tad Moore, BCPS  Clinical Pharmacist Pager [phone]  06/13/2015 9:06 AM

## 2015-06-13 NOTE — Progress Notes (Signed)
Patient ID: Katrina Peck, female   DOB: 1945/12/06, 70 y.o.   MRN: [id number]     SUBJECTIVE: Feeling OK, back still causing a lot of discomfort.  Working with PT, doing the best she can with the pain.  Husband in room today, would like to know plan going forward.   Cultures with MSSA and Klebsiella.    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 amiodarone  100 mg Oral Daily  . cefTRIAXone (ROCEPHIN)  IV  2 g Intravenous Q24H  . digoxin  0.0625 mg Oral Daily  . feeding supplement  1 Container Oral TID BM  . magnesium oxide  200 mg Oral BID  . polyethylene glycol  17 g Oral Daily  . sodium chloride flush  3 mL Intravenous Q12H  . spironolactone  12.5 mg Oral Daily  . torsemide  20 mg Oral Daily  . Warfarin - Pharmacist Dosing Inpatient   Does not apply q1800   Continuous Infusions: . milrinone 0.25 mcg/kg/min (06/12/15 2216)   PRN Meds:.acetaminophen **OR** acetaminophen, HYDROmorphone (DILAUDID) injection, methocarbamol, morphine injection, oxyCODONE-acetaminophen, sodium chloride flush    Filed Vitals:   06/12/15 2010 06/13/15 0010 06/13/15 0421 06/13/15 0500  BP: 95/64 105/75 109/63   Pulse:      Temp: 98.2 F (36.8 C) 98.1 F (36.7 C) 98.2 F (36.8 C)   TempSrc: Oral Oral Oral   Resp: 24 12 19    Height:      Weight:    99 lb 13.9 oz (45.3 kg)  SpO2: 100% 99% 99%    No intake or output data in the 24 hours ending 06/13/15 0732  LABS: Basic Metabolic Panel:  Recent Labs  92/01/00 0545 06/13/15 0509  NA 135 137  K 3.9 3.7  CL 95* 95*  CO2 31 30  GLUCOSE 126* 121*  BUN 16 16  CREATININE 0.79 0.77  CALCIUM 9.1 9.4   Liver Function Tests: No results for input(s): AST, ALT, ALKPHOS, BILITOT, PROT, ALBUMIN in the last 72 hours. No results for input(s): LIPASE, AMYLASE in the last 72 hours. CBC:  Recent Labs  06/12/15 0545 06/13/15 0509  WBC 8.8 8.2  NEUTROABS 6.6 6.3  HGB 10.3* 10.7*  HCT 33.0* 34.4*  MCV 84.4 84.7  PLT 348 366   Cardiac Enzymes: No results for input(s): CKTOTAL, CKMB, CKMBINDEX, TROPONINI in the last 72 hours. BNP: Invalid input(s): POCBNP D-Dimer: No results for input(s): DDIMER in the last 72 hours. Hemoglobin A1C: No results for input(s): HGBA1C in the last 72 hours. Fasting Lipid Panel: No results for input(s): CHOL, HDL, LDLCALC, TRIG, CHOLHDL, LDLDIRECT in the last 72 hours. Thyroid Function Tests: No results for input(s): TSH, T4TOTAL, T3FREE, THYROIDAB in the last 72 hours.  Invalid input(s): FREET3 Anemia Panel: No results for input(s): VITAMINB12, FOLATE, FERRITIN, TIBC, IRON, RETICCTPCT in the last 72 hours.  RADIOLOGY: Ct Chest Wo Contrast  06/09/2015  CLINICAL DATA:  Lung nodule. Hx HTN, CHF, NICM, and pericardial effusion. EXAM: CT CHEST WITHOUT CONTRAST TECHNIQUE: Multidetector CT imaging of the chest was performed following the standard protocol without IV contrast. COMPARISON:  06/04/2015, 07/24/2014 FINDINGS: Heart: There is a large pericardial effusion, measuring 3.5 cm adjacent to the right atrium. Aortic valve calcification. No significant coronary artery calcification. Vascular structures: There is mild atherosclerotic calcification of the thoracic aorta. No associated aneurysm. Pulmonary arteries have a normal noncontrast  appearance. Mediastinum/thyroid: The visualized portion of the thyroid gland has a normal appearance. No mediastinal, hilar, or axillary adenopathy. Lungs/Airways: There are bilateral pleural effusions, right greater than left. Associated bilateral lower lobe infiltrates are also noted, right greater than left. Bibasilar calcifications are noted in the lung parenchyma, of indeterminate significance and stable since prior study. Adjacent to the left major fissure there is a small opacity which measures 8 x 5 mm and contains a central  lucency. Upper abdomen: There are parenchymal calcifications within the kidneys bilaterally. Chest wall/osseous structures: Midthoracic spondylosis. IMPRESSION: 1. Large pericardial effusion. 2. Small nodule within the left major fissure, with diameter of 7 mm. This is favored be benign, possibly related to pleural effusion, a pseudo tumor. Follow-up CT of the chest is recommended in 6 months to document resolution. 3. Small bilateral pleural effusions, right greater than left. 4. Right greater than left lower lobe consolidation. Electronically Signed   By: Norva Pavlov M.D.   On: 06/09/2015 14:00   Mr Cervical Spine Wo Contrast  06/08/2015  CLINICAL DATA:  Back pain, worsening recently. EXAM: MRI CERVICAL SPINE WITHOUT CONTRAST TECHNIQUE: Multiplanar, multisequence MR imaging of the cervical spine was performed. No intravenous contrast was administered. COMPARISON:  None. FINDINGS: Foramen magnum is widely patent. C1-2 is normal. 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 C3-4: Spondylosis with endplate osteophytes and bulging of the disc. Facet arthropathy left worse than right with some edema of the facet joint on the left. Narrowing of the subarachnoid space surrounding the cord. AP diameter of the canal only 7 mm at this level. Mild deformity of the cord. Foraminal stenosis bilaterally because of osteophytic encroachment, left worse than right because of the facet arthropathy. C4-5: Spondylosis with endplate osteophytes and bulging of the disc. Effacement of the subarachnoid space surrounding the cord with slight cord deformity. AP diameter of the canal only 7 mm. Foraminal stenosis bilaterally because of osteophytic encroachment. C5-6: Spondylosis with endplate osteophytes and bulging disc material more pronounced towards the right. Effacement of the subarachnoid space surrounding the cord. AP diameter of the canal is 9 mm. Foraminal stenosis on the right that could compress the C6  nerve root. C6-7: Spondylosis with endplate osteophytes and bulging of the disc. Narrowing of the subarachnoid space surrounding the cord. AP diameter of the canal measures 9 mm. Foraminal stenosis right worse than left that could cause neural compression. C7-T1: Spondylosis with endplate osteophytes and bulging of the disc. No compressive narrowing of the central canal. Mild foraminal narrowing on the right and moderate foraminal narrowing on the left. No abnormal cord edema. 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 Canal stenosis at C3-4 and C4-5 with AP diameter of canal only 7 mm at those levels. Pronounced facet arthropathy on the left at C3-4 with edema. This could be a cause of left-sided neck pain. Foraminal stenosis on the left at C3-4 could cause neural compression. Foraminal stenosis bilaterally at C4-5 could cause neural compression. Spondylosis at C5-6 and C6-7 with canal narrowing to a diameter of 9 mm. Foraminal stenosis bilaterally at those levels that could compress either or both C6 or C7 nerve roots. Foraminal narrowing on the left at C7-T1 that could be symptomatic. Electronically Signed   By: Paulina Fusi M.D.   On: 06/08/2015 18:35   Mr Lumbar Spine Wo Contrast  06/08/2015  CLINICAL DATA:  Back pain, acute worsening. EXAM: MRI LUMBAR SPINE WITHOUT CONTRAST TECHNIQUE: Multiplanar, multisequence MR  imaging of the lumbar spine was performed. No intravenous contrast was administered. COMPARISON:  CT abdomen 06/05/2015 FINDINGS: Sagittal STIR imaging was performed. We are asked to abbreviated the study by the clinical service. There are non-compressive disc bulges at L2-3, L3-4, L4-5 and L5-S1. There is facet arthropathy at L4-5 and L5-S1 with 2 mm of anterolisthesis at L4-5. There is not appear to be any compressive stenosis of the central canal. Lower lumbar facet arthropathy could contribute to back pain, but this would likely be  chronic. 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 The facet arthropathy could be associated with low back pain. Electronically Signed   By: Paulina Fusi M.D.   On: 06/08/2015 18:37   Mr Thoracic Spine W Wo Contrast  06/08/2015  CLINICAL DATA:  Severe back pain with acute worsening. EXAM: MRI THORACIC SPINE WITHOUT AND WITH CONTRAST TECHNIQUE: Multiplanar and multiecho pulse sequences of the thoracic spine were obtained without and with intravenous contrast. CONTRAST:  10mL MULTIHANCE GADOBENATE DIMEGLUMINE 529 MG/ML IV SOLN COMPARISON:  None. FINDINGS: T1-2:  Normal. T2-3:  Bulging of the disc.  No compressive stenosis. T3-4 through T10-11: Normal. No disc pathology. No fracture or focal bone lesion. The patient does have a layering pleural effusion on the right. T11-12: Spondyloarthropathy at this level. Mild edema within the disc space and of the adjacent endplates at T11 and T12. The surrounding soft tissues appear to show edematous change. This appearance could be due to single level degenerative spondyloarthropathy, but the possibility of discitis osteomyelitis certainly exists on the basis of the imaging. 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 Edema within the disc and within the T11 and T12 vertebral bodies. Surrounding edematous change in the paraspinous soft tissues. No evidence of epidural collection or neural compression. Sterile degenerative spondylosis can have this appearance, particularly in dialysis patients, but there is considerable concern for infection in this case. Electronically Signed   By: Paulina Fusi M.D.   On: 06/08/2015 18:42   Ct Abdomen Pelvis W Contrast  06/05/2015  CLINICAL DATA:  Back spasms.  Leukocytosis. EXAM: CT ABDOMEN AND PELVIS WITH CONTRAST TECHNIQUE: Multidetector CT imaging of the abdomen and pelvis was  performed using the standard protocol following bolus administration of intravenous contrast. CONTRAST:  75mL OMNIPAQUE IOHEXOL 300 MG/ML  SOLN COMPARISON:  None. FINDINGS: Lower chest and abdominal wall: Chronic cardiomegaly. Remote subendocardial left ventricular infarct, greatest along the anterior wall. Chronic large pericardial effusion when compared to previous CT. Chronic small right pleural effusion with atelectasis or scar. Hepatobiliary: Liver enhancement pattern suggests passive congestion. No focal lesion.Cholelithiasis without evidence of acute cholecystitis. No duct enlargement Pancreas: Unremarkable. Spleen: Unremarkable. Adrenals/Urinary Tract: Negative adrenals. Bilateral renal cortical thinning. Small nonobstructive bilateral renal calculi. Renal sinus cysts. Chronic hemorrhagic or proteinaceous cyst in the upper pole left kidney measuring 13 mm, also seen on April 2016 chest CT. Unremarkable decompressed bladder. Reproductive:Multiple uterine fibroids measuring up to 23 mm. Stomach/Bowel: No obstruction. Colonic diverticulosis without active inflammation. No evidence of appendicitis. Vascular/Lymphatic: No acute vascular abnormality. No mass or adenopathy. Peritoneal: Small ascites Musculoskeletal: No acute abnormalities. IMPRESSION: 1. No acute finding. 2. Remote left ventricular infarct with chronic cardiomegaly. Chronic large pericardial effusion and small right pleural effusion. 3. Passive congestion of the liver. 4. Cholelithiasis and nephrolithiasis. 5. Colonic diverticulosis. Electronically Signed   By: Marnee Spring M.D.   On: 06/05/2015 01:33  Ir US Guide Vasc Access Right  06/10/2015  CLINICAL DATA:  Chronic systolic CHF, needs long-term venous access for IV milrinone. Recent bacteremia. EXAM: TUNNELED CENTRAL VENOUS CATHETER PLACEMENT WITH ULTRASOUND AND FLUOROSCOPIC GUIDANCE TECHNIQUE: The procedure, risks, benefits, and alternatives were explained to the patient. Questions  regarding the procedure were encouraged and answered. The patient understands and consents to the procedure. Patient was already receiving adequate antibiotic coverage.Patency of the right IJ vein was confirmed with ultrasound with image documentation. An appropriate skin site was determined. Region was prepped using maximum barrier technique including cap and mask, sterile gown, sterile gloves, large sterile sheet, and Chlorhexidine as cutaneous antisepsis. The region was infiltrated locally with 1% lidocaine. Under real-time ultrasound guidance, the right IJ vein was accessed with a 21 gauge micropuncture needle; the needle tip within the vein was confirmed with ultrasound image documentation. 37F dual-lumen cuffed powerPICC tunneled from a right anterior chest wall approach to the dermatotomy site. Needle exchanged over the 018 guidewire for transitional dilator, through which the catheter which had been cut to 21 cm was advanced under intermittent fluoroscopy, positioned with its tip at the cavoatrial junction. Spot chest radiograph confirms good catheter position. No pneumothorax. Catheter was flushed per protocol. Catheter secured externally with O Prolene suture. The right IJ dermatotomy site was closed with Dermabond. COMPLICATIONS: COMPLICATIONS None immediate FLUOROSCOPY TIME:  0.1 minute, 4.9 uGym2 DAP COMPARISON:  None IMPRESSION: 1. Technically successful placement of tunneled right IJ tunneled dual-lumen power injectable catheter with ultrasound and fluoroscopic guidance. Ready for routine use. Electronically Signed   By: Corlis Leak M.D.   On: 06/10/2015 12:35   Dg Chest Port 1 View  06/04/2015  CLINICAL DATA:  Shortness of breath EXAM: PORTABLE CHEST 1 VIEW COMPARISON:  03/22/2015 chest radiograph. FINDINGS: Right internal jugular central venous catheter terminates at the cavoatrial junction. Stable cardiomediastinal silhouette with moderate cardiomegaly. No pneumothorax. Small bilateral pleural  effusions. Mild pulmonary edema. Mild bibasilar atelectasis. There is a new subcentimeter nodular density in the peripheral left mid lung. IMPRESSION: 1. Moderate cardiomegaly and mild pulmonary edema, in keeping with mild congestive heart failure. 2. Small bilateral pleural effusions and mild bibasilar atelectasis. 3. New subcentimeter nodular density in the peripheral left mid lung, cannot exclude a pulmonary nodule. Recommend correlation with chest CT on a short term outpatient basis. Electronically Signed   By: Delbert Phenix M.D.   On: 06/04/2015 20:44   Ir Fluoro Guide Cv Midline Picc Right  06/10/2015  CLINICAL DATA:  Chronic systolic CHF, needs long-term venous access for IV milrinone. Recent bacteremia. EXAM: TUNNELED CENTRAL VENOUS CATHETER PLACEMENT WITH ULTRASOUND AND FLUOROSCOPIC GUIDANCE TECHNIQUE: The procedure, risks, benefits, and alternatives were explained to the patient. Questions regarding the procedure were encouraged and answered. The patient understands and consents to the procedure. Patient was already receiving adequate antibiotic coverage.Patency of the right IJ vein was confirmed with ultrasound with image documentation. An appropriate skin site was determined. Region was prepped using maximum barrier technique including cap and mask, sterile gown, sterile gloves, large sterile sheet, and Chlorhexidine as cutaneous antisepsis. The region was infiltrated locally with 1% lidocaine. Under real-time ultrasound guidance, the right IJ vein was accessed with a 21 gauge micropuncture needle; the needle tip within the vein was confirmed with ultrasound image documentation. 37F dual-lumen cuffed powerPICC tunneled from a right anterior chest wall approach to the dermatotomy site. Needle exchanged over the 018 guidewire for transitional dilator, through which the catheter which had been cut to 21 cm  was advanced under intermittent fluoroscopy, positioned with its tip at the cavoatrial junction. Spot  chest radiograph confirms good catheter position. No pneumothorax. Catheter was flushed per protocol. Catheter secured externally with O Prolene suture. The right IJ dermatotomy site was closed with Dermabond. COMPLICATIONS: COMPLICATIONS None immediate FLUOROSCOPY TIME:  0.1 minute, 4.9 uGym2 DAP COMPARISON:  None IMPRESSION: 1. Technically successful placement of tunneled right IJ tunneled dual-lumen power injectable catheter with ultrasound and fluoroscopic guidance. Ready for routine use. Electronically Signed   By: Corlis Leak M.D.   On: 06/10/2015 12:35    PHYSICAL EXAM General: Appears older than stated age, Chronically ill appearing.   Neck: JVP flat, cm, no thyromegaly or thyroid nodule.  Lungs: Decreased bases, normal effort CV: Lateral PMI.  Heart regular S1/S2, no S3/S4, no murmur.   Abdomen: Soft, non-tender, non-distended, no HSM. No bruits or masses. +BS  Neurologic: Alert and oriented x 3.  Psych: Normal affect. Extremities: No clubbing or cyanosis. No edema.    TELEMETRY: Reviewed, NSR  ASSESSMENT AND PLAN: 70 yo with nonischemic cardiomyopathy, chronic moderate pleural effusion, paroxysmal atrial fibrillation, and LV thrombus on home milrinone presented with severe back pain.  WBCs high, concern for tunneled catheter infection with possible spread cause discitis/back pain.     1. ID: MSSA and Klebsiella bacteremia, likely line sepsis.  Original line out.  ID following.  Remains afebrile, now on ceftriaxone.  TEE negative for vegetation or endocarditis.  MRI showed T11-T12 osteomyelitis.  Now has new tunneled catheter with repeat cultures negative.  - Ceftriaxone for 6 wks per ID.   2. Acute on chronic systolic CHF: Echo with EF 20%, moderate pericardial effusion.  She is milrinone dependent. CVP 2-3 today.  - Continue milrinone.  - Now on home torsemide 20 mg daily.  Will place back on her K supp as well. - Continue digoxin.  3. LV mural thrombus: Continue coumadin.  4.  Deconditioning: - PT working with. Making slow progress. Being considered for CIR but not meeting physical requirements currently.  - Will likely need SNF on d/c.    Katrina Freer PA-C 06/13/2015 7:32 AM  Advanced Heart Failure Team Pager [phone] (M-F; 7a - 4p)  Please contact [facility] for night-coverage after hours (4p -7a ) and weekends on [email or web address]  Patient seen with PA, agree with the above note.  She is stable from cardiac perspective.  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  Will need 6 wks antibiotics.    Continue current medications, no changes.   I told her that I will try to get her an outpatient neurology appointment to look for nerve damage from prior PICC in her right arm.   I think she could go to SNF when bed is ready.   Katrina Peck 06/13/2015 9:02 AM

## 2015-06-13 NOTE — Progress Notes (Signed)
Called report to nurse receiving pt at [facility].  All questions answered.

## 2015-06-13 NOTE — Clinical Social Work Placement (Signed)
   CLINICAL SOCIAL WORK PLACEMENT  NOTE  Date:  06/13/2015  Patient Details  Name: Katrina Peck MRN: [id number] Date of Birth: 21-Aug-1945  Clinical Social Work is seeking post-discharge placement for this patient at the Skilled  Nursing Facility level of care (*CSW will initial, date and re-position this form in  chart as items are completed):  Yes   Patient/family provided with [facility] Clinical Social Work Department's list of facilities offering this level of care within the geographic area requested by the patient (or if unable, by the patient's family).  Yes   Patient/family informed of their freedom to choose among providers that offer the needed level of care, that participate in Medicare, Medicaid or managed care program needed by the patient, have an available bed and are willing to accept the patient.  Yes   Patient/family informed of [facility]'s ownership interest in [facility] and [facility], as well as of the fact that they are under no obligation to receive care at these facilities.  PASRR submitted to EDS on 06/12/15     PASRR number received on 06/12/15     Existing PASRR number confirmed on       FL2 transmitted to all facilities in geographic area requested by pt/family on 06/12/15     FL2 transmitted to all facilities within larger geographic area on       Patient informed that his/her managed care company has contracts with or will negotiate with certain facilities, including the following:        Yes   Patient/family informed of bed offers received.  Patient chooses bed at [facility]     Physician recommends and patient chooses bed at      Patient to be transferred to [facility] on 06/13/15.  Patient to be transferred to facility by [facility]     Patient family notified on 06/13/15 of transfer.  Name of family member notified:  Kwan Berland     PHYSICIAN Please prepare  prescriptions     Additional Comment:   Per MD patient ready for DC to [name]. RN, patient, patient's family, and facility notified of DC. RN given number for report. DC packet on chart. Ambulance transport requested for patient. CSW signing off.  _______________________________________________ Venita Lick, LCSW 06/13/2015, 4:32 PM

## 2015-06-13 NOTE — Progress Notes (Signed)
Occupational Therapy Treatment Patient Details Name: Katrina Peck MRN: [id number] DOB: 07-01-1945 Today's Date: 06/13/2015    History of present illness 70 yo female admitted with severe back pain workup underway to r/o discitis and sepsis.  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  PMH: anemia, HTN, chronic systolic CHF, NICM pericardial effusion   OT comments  Pt focused on fear of bowel incontinence, pain and cleanliness. Highly anxious and requires increased time for all mobility. Immediately agreeable to OOB activity and was pleased with her ability to walk and stand. Will continue to follow.  Follow Up Recommendations  SNF;Supervision/Assistance - 24 hour    Equipment Recommendations       Recommendations for Other Services      Precautions / Restrictions Precautions Precautions: Fall Precaution Comments: Observing back precautions for comfort Restrictions Weight Bearing Restrictions: No       Mobility Bed Mobility Overal bed mobility: Needs Assistance Bed Mobility: Rolling;Sidelying to Sit Rolling: Min guard Sidelying to sit: Min guard       General bed mobility comments: Step by step cues for log roll technique. Pt requires increased time.    Transfers Overall transfer level: Needs assistance Equipment used: Rolling walker (2 wheeled) Transfers: Sit to/from Stand Sit to Stand: Min guard;+2 safety/equipment         General transfer comment: Cues for hand placement and technique.  Pt very slow to stand.  Hand over hand assist to reach back for armrests when sitting.      Balance Overall balance assessment: Needs assistance Sitting-balance support: Bilateral upper extremity supported;Feet supported Sitting balance-Leahy Scale: Good     Standing balance support: Bilateral upper extremity supported;During functional activity Standing balance-Leahy Scale: Poor Standing balance comment: reliant on B UE support                   ADL Overall  ADL's : Needs assistance/impaired Eating/Feeding: Set up;Sitting Eating/Feeding Details (indicate cue type and reason): pt wanting assistance with opening packages Grooming: Wash/dry hands;Sitting;Set up               Lower Body Dressing: Maximal assistance;Sitting/lateral leans Lower Body Dressing Details (indicate cue type and reason): max assist for socks     Toileting- Clothing Manipulation and Hygiene: Total assistance;Sit to/from stand Toileting - Clothing Manipulation Details (indicate cue type and reason): pt with bowel incontinence       General ADL Comments: Pt eager to get up and walk, then did not want to sit down.      Vision                     Perception     Praxis      Cognition   Behavior During Therapy: Anxious Overall Cognitive Status: Within Functional Limits for tasks assessed                       Extremity/Trunk Assessment               Exercises    Shoulder Instructions       General Comments      Pertinent Vitals/ Pain       Pain Assessment: Faces Faces Pain Scale: Hurts whole lot Pain Location: back Pain Descriptors / Indicators: Grimacing;Moaning Pain Intervention(s): Limited activity within patient's tolerance;Monitored during session;Premedicated before session;Repositioned  Home Living  Prior Functioning/Environment              Frequency Min 2X/week     Progress Toward Goals  OT Goals(current goals can now be found in the care plan section)  Progress towards OT goals: Progressing toward goals  Acute Rehab OT Goals Patient Stated Goal: to sit up for a few hours today  Plan Discharge plan remains appropriate    Co-evaluation    PT/OT/SLP Co-Evaluation/Treatment: Yes Reason for Co-Treatment: For patient/therapist safety;Necessary to address cognition/behavior during functional activity   OT goals addressed during session: ADL's  and self-care      End of Session Equipment Utilized During Treatment: Rolling walker;Gait belt   Activity Tolerance Patient tolerated treatment well   Patient Left in chair;with call bell/phone within reach;with chair alarm set   Nurse Communication          Time: [phone] OT Time Calculation (min): 21 min  Charges: OT General Charges $OT Visit: 1 Procedure OT Treatments $Self Care/Home Management : 8-22 mins  Evern Bio 06/13/2015, 2:40 PM  [phone]

## 2015-06-13 NOTE — Discharge Summary (Signed)
Katrina Peck, is a 70 y.o. female  DOB 1946/02/27  MRN [id number].  Admission date:  06/04/2015  Admitting Physician  Lorretta Harp, MD  Discharge Date:  06/13/2015   Primary MD  Maryelizabeth Rowan, MD  Recommendations for primary care physician for things to follow:   Check CBC, CMP once a week until the patient is on antibiotic, will forward results to ID office of Dr. Orvan Falconer.  She must follow with ID and cardiology within a week.  Needs repeat CT chest in 6 months.   Admission Diagnosis  Bacteremia [R78.81] Neck pain [M54.2] Back pain [M54.9] SOB (shortness of breath) [R06.02] Sepsis, due to unspecified organism [facility]) [A41.9]   Discharge Diagnosis  Bacteremia [R78.81] Neck pain [M54.2] Back pain [M54.9] SOB (shortness of breath) [R06.02] Sepsis, due to unspecified organism [facility]) [A41.9]     Principal Problem:   Back pain Active Problems:   Essential hypertension   Pericardial effusion   Chronic systolic heart failure (HCC)   Paroxysmal atrial fibrillation (HCC)   Protein-calorie malnutrition, severe (HCC)   Long term (current) use of anticoagulants [Z79.01]   Sepsis (HCC)   Back spasm   Neck pain   Bacteremia   Malnutrition of moderate degree   Acute combined systolic and diastolic congestive heart failure (HCC)   Leukocytosis   Acute blood loss anemia   Lung nodule   Discitis of thoracic region      Past Medical History  Diagnosis Date  . Anemia   . Hypertension   . Blood transfusion without reported diagnosis   . Chronic systolic CHF (congestive heart failure) (HCC)     a. 06/2014 Echo: EF 20%.  Marland Kitchen NICM (nonischemic cardiomyopathy) (HCC)     a. 06/2014 Echo: EF 20%, diff HK, mild MR, mildly dil LA/RA, mildly reduced RV fxn;  b. 06/2014 Cath: nl cors.  . Pericardial effusion     a. 06/2014  Large effusion, no tamponade phys.    Past Surgical History  Procedure Laterality Date  . No past surgeries    . Left and right heart catheterization with coronary angiogram N/A 07/23/2014    Procedure: LEFT AND RIGHT HEART CATHETERIZATION WITH CORONARY ANGIOGRAM;  Surgeon: Laurey Morale, MD;  Location: [facility] CATH LAB;  Service: Cardiovascular;  Laterality: N/A;  . Subxyphoid pericardial window N/A 07/26/2014    Procedure: SUBXYPHOID PERICARDIAL WINDOW;  Surgeon: Delight Ovens, MD;  Location: [facility] OR;  Service: Thoracic;  Laterality: N/A;  . Tee without cardioversion N/A 07/26/2014    Procedure: TRANSESOPHAGEAL ECHOCARDIOGRAM (TEE);  Surgeon: Delight Ovens, MD;  Location: [facility] OR;  Service: Thoracic;  Laterality: N/A;  . Pleural effusion drainage N/A 07/26/2014    Procedure: DRAINAGE OF PERICARDIAL EFFUSION;  Surgeon: Delight Ovens, MD;  Location: [facility] OR;  Service: Thoracic;  Laterality: N/A;  . Cardiac catheterization N/A 02/07/2015    Procedure: Right Heart Cath;  Surgeon: Laurey Morale, MD;  Location: [facility] INVASIVE CV LAB;  Service: Cardiovascular;  Laterality: N/A;  . Tee without cardioversion N/A  06/06/2015    Procedure: TRANSESOPHAGEAL ECHOCARDIOGRAM (TEE);  Surgeon: Dolores Patty, MD;  Location: [facility] ENDOSCOPY;  Service: Cardiovascular;  Laterality: N/A;  . Radiology with anesthesia N/A 06/08/2015    Procedure: MRI CERVICAL SPINE WITH/WITHOUT LUMBAR WITH/WITHOUT THORACIC WITH/WITHOUT       (RADIOLOGY WITH ANESTHESIA);  Surgeon: Medication Radiologist, MD;  Location: [facility] OR;  Service: Radiology;  Laterality: N/A;       HPI  from the history and physical done on the day of admission:   70 year old female with history of chronic systolic heart failure-on milrinone infusion at home via a tunneled catheter, presented to the hospital with severe lower back pain. Found to have significant leukocytosis. 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 Seen by infectious disease, recommendations are to continue IV Rocephin until April 24. Plans are for either CIR or SNF on discharge     Hospital Course:     Sepsis due to MSSA and Klebsiella bacteremia along with T11/T12 discitis:Secondary to catheter related blood stream infection. Tunneled catheter removed by IR on 3/13. MRI entire spine with gen anesthesia/sedation showed T11/T12 discitis. TEE 3/13 without any vegetations. Repeat blood cultures negative, subsequently midline catheter reinserted. ID now recommending IV Rocephin until April 24 Tentatively, final stop date per ID. Needs weekly CBC and CMP faxed to infectious disease clinic from SNF/CIR. She must follow with ID within 1-2 weeks for the final stop date.   Acute on chronic systolic heart failure (EF 20% by TTE on December 2016): Hx of Nonischemic cardiomyopathy.LHC(4/16) with no significant coronary disease. She is not compensated and cleared for discharge by CHF team Dr. Ronelle Nigh who are discussed the case with today, medications as below.  History of LV thrombus: On chronic Coumadin. Pharmacy dosing-INR therapeutic. Monitor INR once every week.  Paroxysmal atrial fibrillation: Rate controlled with amiodarone, on chronic Coumadin. CHADS2VASC 5.   Chronic pericardial effusion: s/p window 07/26/14-cytology was negative. CT abdomen done on admission shows a large pericardial effusion-however TEE 3/13 showed moderate pericardial effusion-no tamponade  Pul WLK:HVFMBB secondary to LV failure  Nodular density in left mid lung on chest x-ray: CT chest confirmed a small nodule in the left major fissure, recommendations from radiology are to repeat a CT chest in 6 months.  Protein-calorie malnutrition, severe:nutriton evaluation   Discharge Condition: Fair  Follow UP  Follow-up Information    Follow up with [facility], MD. Schedule an appointment as soon  as possible for a visit in 3 days.   Specialty:  Family Medicine   Contact information:   [address] [place] [address] [phone]       Follow up with [name], MD. Schedule an appointment as soon as possible for a visit in 1 week.   Specialty:  Cardiology   Contact information:   [address] [place] [address] [phone]       Follow up with Cliffton Asters, MD. Schedule an appointment as soon as possible for a visit in 1 week.   Specialty:  Infectious Diseases   Contact information:   [address] [place] [address] [phone]        Consults obtained -  cardiology and ID  Diet and Activity recommendation: See Discharge Instructions below  Discharge Instructions       Discharge Instructions    Discharge instructions    Complete by:  As directed  Follow with Primary MD DEWEY,ELIZABETH, MD in 3-4 days   Get CBC, CMP, Magnesium, INR,  2 view Chest X ray checked  by SNF MD in 2-3 days.    Activity: As tolerated with Full fall precautions use walker/cane & assistance as needed   Disposition Home     Diet:   Heart Healthy Check your Weight same time everyday, if you gain over 2 pounds, or you develop in leg swelling, experience more shortness of breath or chest pain, call your Primary MD immediately. Follow Cardiac Low Salt Diet and 1.2 lit/day fluid restriction.   On your next visit with your primary care physician please Get Medicines reviewed and adjusted.   Please request your Prim.MD to go over all Hospital Tests and Procedure/Radiological results at the follow up, please get all Hospital records sent to your Prim MD by signing hospital release before you go home.   If you experience worsening of your admission symptoms, develop shortness of breath, life threatening emergency, suicidal or homicidal thoughts you must seek medical attention immediately by calling 911 or calling your MD immediately   if symptoms less severe.  You Must read complete instructions/literature along with all the possible adverse reactions/side effects for all the Medicines you take and that have been prescribed to you. Take any new Medicines after you have completely understood and accpet all the possible adverse reactions/side effects.   Do not drive, operating heavy machinery, perform activities at heights, swimming or participation in water activities or provide baby sitting services if your were admitted for syncope or siezures until you have seen by Primary MD or a Neurologist and advised to do so again.  Do not drive when taking Pain medications.    Do not take more than prescribed Pain, Sleep and Anxiety Medications  Special Instructions: If you have smoked or chewed Tobacco  in the last 2 yrs please stop smoking, stop any regular Alcohol  and or any Recreational drug use.  Wear Seat belts while driving.   Please note  You were cared for by a hospitalist during your hospital stay. If you have any questions about your discharge medications or the care you received while you were in the hospital after you are discharged, you can call the unit and asked to speak with the hospitalist on call if the hospitalist that took care of you is not available. Once you are discharged, your primary care physician will handle any further medical issues. Please note that NO REFILLS for any discharge medications will be authorized once you are discharged, as it is imperative that you return to your primary care physician (or establish a relationship with a primary care physician if you do not have one) for your aftercare needs so that they can reassess your need for medications and monitor your lab values.     Increase activity slowly    Complete by:  As directed              Discharge Medications       Medication List    TAKE these medications        amiodarone 100 MG tablet  Commonly known as:  PACERONE    Take 1 tablet (100 mg total) by mouth daily.     cefTRIAXone 2 g in dextrose 5 % 50 mL  Inject 2 g into the vein daily. Tentative stop date 06/28/2015, patient must follow with ID clinic within 1-2 weeks. Final stop date per ID. Please draw CBC and  CMP once a week and fax the results to the ID office of Dr. Orvan Falconer.     Digoxin 62.5 MCG Tabs  Take 0.0625 mg by mouth daily.     feeding supplement (ENSURE ENLIVE) Liqd  Take 237 mLs by mouth 2 (two) times daily between meals.     Magnesium Oxide 200 MG Tabs  Take 1 tablet (200 mg total) by mouth 2 (two) times daily.     milrinone 20 MG/100ML Soln infusion  Commonly known as:  PRIMACOR  Inject 11.6 mcg/min into the vein continuous.     oxyCODONE-acetaminophen 5-325 MG tablet  Commonly known as:  PERCOCET/ROXICET  Take 1 tablet by mouth every 4 (four) hours as needed for moderate pain.     potassium chloride SA 20 MEQ tablet  Commonly known as:  K-DUR,KLOR-CON  Take 2 tablets (40 mEq total) by mouth daily.     spironolactone 25 MG tablet  Commonly known as:  ALDACTONE  Take 0.5 tablets (12.5 mg total) by mouth daily.     torsemide 20 MG tablet  Commonly known as:  DEMADEX  Take 1 tablet (20 mg total) by mouth daily.     warfarin 2 MG tablet  Commonly known as:  COUMADIN  Take as directed by Coumadin Clinic        Major procedures and Radiology Reports - PLEASE review detailed and final reports for all details, in brief -       Ct Chest Wo Contrast  06/09/2015  CLINICAL DATA:  Lung nodule. Hx HTN, CHF, NICM, and pericardial effusion. EXAM: CT CHEST WITHOUT CONTRAST TECHNIQUE: Multidetector CT imaging of the chest was performed following the standard protocol without IV contrast. COMPARISON:  06/04/2015, 07/24/2014 FINDINGS: Heart: There is a large pericardial effusion, measuring 3.5 cm adjacent to the right atrium. Aortic valve calcification. No significant coronary artery calcification. Vascular structures: There is mild  atherosclerotic calcification of the thoracic aorta. No associated aneurysm. Pulmonary arteries have a normal noncontrast appearance. Mediastinum/thyroid: The visualized portion of the thyroid gland has a normal appearance. No mediastinal, hilar, or axillary adenopathy. Lungs/Airways: There are bilateral pleural effusions, right greater than left. Associated bilateral lower lobe infiltrates are also noted, right greater than left. Bibasilar calcifications are noted in the lung parenchyma, of indeterminate significance and stable since prior study. Adjacent to the left major fissure there is a small opacity which measures 8 x 5 mm and contains a central lucency. Upper abdomen: There are parenchymal calcifications within the kidneys bilaterally. Chest wall/osseous structures: Midthoracic spondylosis. IMPRESSION: 1. Large pericardial effusion. 2. Small nodule within the left major fissure, with diameter of 7 mm. This is favored be benign, possibly related to pleural effusion, a pseudo tumor. Follow-up CT of the chest is recommended in 6 months to document resolution. 3. Small bilateral pleural effusions, right greater than left. 4. Right greater than left lower lobe consolidation. Electronically Signed   By: Norva Pavlov M.D.   On: 06/09/2015 14:00   Mr Cervical Spine Wo Contrast  06/08/2015  CLINICAL DATA:  Back pain, worsening recently. EXAM: MRI CERVICAL SPINE WITHOUT CONTRAST TECHNIQUE: Multiplanar, multisequence MR imaging of the cervical spine was performed. No intravenous contrast was administered. COMPARISON:  None. FINDINGS: Foramen magnum is widely patent. C1-2 is normal. 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 C3-4: Spondylosis with endplate osteophytes and bulging of the disc. Facet arthropathy left worse than right with some edema of the facet joint on the left. Narrowing of the subarachnoid  space surrounding the cord. AP diameter of the canal only 7 mm at this level. Mild  deformity of the cord. Foraminal stenosis bilaterally because of osteophytic encroachment, left worse than right because of the facet arthropathy. C4-5: Spondylosis with endplate osteophytes and bulging of the disc. Effacement of the subarachnoid space surrounding the cord with slight cord deformity. AP diameter of the canal only 7 mm. Foraminal stenosis bilaterally because of osteophytic encroachment. C5-6: Spondylosis with endplate osteophytes and bulging disc material more pronounced towards the right. Effacement of the subarachnoid space surrounding the cord. AP diameter of the canal is 9 mm. Foraminal stenosis on the right that could compress the C6 nerve root. C6-7: Spondylosis with endplate osteophytes and bulging of the disc. Narrowing of the subarachnoid space surrounding the cord. AP diameter of the canal measures 9 mm. Foraminal stenosis right worse than left that could cause neural compression. C7-T1: Spondylosis with endplate osteophytes and bulging of the disc. No compressive narrowing of the central canal. Mild foraminal narrowing on the right and moderate foraminal narrowing on the left. No abnormal cord edema. 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 Canal stenosis at C3-4 and C4-5 with AP diameter of canal only 7 mm at those levels. Pronounced facet arthropathy on the left at C3-4 with edema. This could be a cause of left-sided neck pain. Foraminal stenosis on the left at C3-4 could cause neural compression. Foraminal stenosis bilaterally at C4-5 could cause neural compression. Spondylosis at C5-6 and C6-7 with canal narrowing to a diameter of 9 mm. Foraminal stenosis bilaterally at those levels that could compress either or both C6 or C7 nerve roots. Foraminal narrowing on the left at C7-T1 that could be symptomatic. Electronically Signed   By: Paulina Fusi M.D.   On: 06/08/2015 18:35   Mr Lumbar Spine Wo Contrast  06/08/2015   CLINICAL DATA:  Back pain, acute worsening. EXAM: MRI LUMBAR SPINE WITHOUT CONTRAST TECHNIQUE: Multiplanar, multisequence MR imaging of the lumbar spine was performed. No intravenous contrast was administered. COMPARISON:  CT abdomen 06/05/2015 FINDINGS: Sagittal STIR imaging was performed. We are asked to abbreviated the study by the clinical service. There are non-compressive disc bulges at L2-3, L3-4, L4-5 and L5-S1. There is facet arthropathy at L4-5 and L5-S1 with 2 mm of anterolisthesis at L4-5. There is not appear to be any compressive stenosis of the central canal. Lower lumbar facet arthropathy could contribute to back pain, but this would likely be chronic. 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 The facet arthropathy could be associated with low back pain. Electronically Signed   By: Paulina Fusi M.D.   On: 06/08/2015 18:37   Mr Thoracic Spine W Wo Contrast  06/08/2015  CLINICAL DATA:  Severe back pain with acute worsening. EXAM: MRI THORACIC SPINE WITHOUT AND WITH CONTRAST TECHNIQUE: Multiplanar and multiecho pulse sequences of the thoracic spine were obtained without and with intravenous contrast. CONTRAST:  10mL MULTIHANCE GADOBENATE DIMEGLUMINE 529 MG/ML IV SOLN COMPARISON:  None. FINDINGS: T1-2:  Normal. T2-3:  Bulging of the disc.  No compressive stenosis. T3-4 through T10-11: Normal. No disc pathology. No fracture or focal bone lesion. The patient does have a layering pleural effusion on the right. T11-12: Spondyloarthropathy at this level. Mild edema within the disc space and of the adjacent endplates at T11 and T12. The surrounding soft tissues appear to show edematous change.  This appearance could be due to single level degenerative spondyloarthropathy, but the possibility of discitis osteomyelitis certainly exists on the basis of the imaging. 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 Edema within the disc and within the T11 and T12 vertebral bodies. Surrounding edematous change in the paraspinous soft tissues. No evidence of epidural collection or neural compression. Sterile degenerative spondylosis can have this appearance, particularly in dialysis patients, but there is considerable concern for infection in this case. Electronically Signed   By: Paulina Fusi M.D.   On: 06/08/2015 18:42   Ct Abdomen Pelvis W Contrast  06/05/2015  CLINICAL DATA:  Back spasms.  Leukocytosis. EXAM: CT ABDOMEN AND PELVIS WITH CONTRAST TECHNIQUE: Multidetector CT imaging of the abdomen and pelvis was performed using the standard protocol following bolus administration of intravenous contrast. CONTRAST:  75mL OMNIPAQUE IOHEXOL 300 MG/ML  SOLN COMPARISON:  None. FINDINGS: Lower chest and abdominal wall: Chronic cardiomegaly. Remote subendocardial left ventricular infarct, greatest along the anterior wall. Chronic large pericardial effusion when compared to previous CT. Chronic small right pleural effusion with atelectasis or scar. Hepatobiliary: Liver enhancement pattern suggests passive congestion. No focal lesion.Cholelithiasis without evidence of acute cholecystitis. No duct enlargement Pancreas: Unremarkable. Spleen: Unremarkable. Adrenals/Urinary Tract: Negative adrenals. Bilateral renal cortical thinning. Small nonobstructive bilateral renal calculi. Renal sinus cysts. Chronic hemorrhagic or proteinaceous cyst in the upper pole left kidney measuring 13 mm, also seen on April 2016 chest CT. Unremarkable decompressed bladder. Reproductive:Multiple uterine fibroids measuring up to 23 mm. Stomach/Bowel: No obstruction. Colonic diverticulosis without active inflammation. No evidence of appendicitis. Vascular/Lymphatic: No acute vascular abnormality. No mass or adenopathy. Peritoneal: Small ascites Musculoskeletal: No acute abnormalities. IMPRESSION: 1. No  acute finding. 2. Remote left ventricular infarct with chronic cardiomegaly. Chronic large pericardial effusion and small right pleural effusion. 3. Passive congestion of the liver. 4. Cholelithiasis and nephrolithiasis. 5. Colonic diverticulosis. Electronically Signed   By: Marnee Spring M.D.   On: 06/05/2015 01:33   Ir US Guide Vasc Access Right  06/10/2015  CLINICAL DATA:  Chronic systolic CHF, needs long-term venous access for IV milrinone. Recent bacteremia. EXAM: TUNNELED CENTRAL VENOUS CATHETER PLACEMENT WITH ULTRASOUND AND FLUOROSCOPIC GUIDANCE TECHNIQUE: The procedure, risks, benefits, and alternatives were explained to the patient. Questions regarding the procedure were encouraged and answered. The patient understands and consents to the procedure. Patient was already receiving adequate antibiotic coverage.Patency of the right IJ vein was confirmed with ultrasound with image documentation. An appropriate skin site was determined. Region was prepped using maximum barrier technique including cap and mask, sterile gown, sterile gloves, large sterile sheet, and Chlorhexidine as cutaneous antisepsis. The region was infiltrated locally with 1% lidocaine. Under real-time ultrasound guidance, the right IJ vein was accessed with a 21 gauge micropuncture needle; the needle tip within the vein was confirmed with ultrasound image documentation. 65F dual-lumen cuffed powerPICC tunneled from a right anterior chest wall approach to the dermatotomy site. Needle exchanged over the 018 guidewire for transitional dilator, through which the catheter which had been cut to 21 cm was advanced under intermittent fluoroscopy, positioned with its tip at the cavoatrial junction. Spot chest radiograph confirms good catheter position. No pneumothorax. Catheter was flushed per protocol. Catheter secured externally with O Prolene suture. The right IJ dermatotomy site was closed with Dermabond. COMPLICATIONS: COMPLICATIONS None  immediate FLUOROSCOPY TIME:  0.1 minute, 4.9 uGym2 DAP COMPARISON:  None IMPRESSION: 1. Technically successful placement of tunneled right IJ tunneled dual-lumen power injectable catheter with ultrasound and  fluoroscopic guidance. Ready for routine use. Electronically Signed   By: Corlis Leak M.D.   On: 06/10/2015 12:35   Dg Chest Port 1 View  06/04/2015  CLINICAL DATA:  Shortness of breath EXAM: PORTABLE CHEST 1 VIEW COMPARISON:  03/22/2015 chest radiograph. FINDINGS: Right internal jugular central venous catheter terminates at the cavoatrial junction. Stable cardiomediastinal silhouette with moderate cardiomegaly. No pneumothorax. Small bilateral pleural effusions. Mild pulmonary edema. Mild bibasilar atelectasis. There is a new subcentimeter nodular density in the peripheral left mid lung. IMPRESSION: 1. Moderate cardiomegaly and mild pulmonary edema, in keeping with mild congestive heart failure. 2. Small bilateral pleural effusions and mild bibasilar atelectasis. 3. New subcentimeter nodular density in the peripheral left mid lung, cannot exclude a pulmonary nodule. Recommend correlation with chest CT on a short term outpatient basis. Electronically Signed   By: Delbert Phenix M.D.   On: 06/04/2015 20:44   Ir Fluoro Guide Cv Midline Picc Right  06/10/2015  CLINICAL DATA:  Chronic systolic CHF, needs long-term venous access for IV milrinone. Recent bacteremia. EXAM: TUNNELED CENTRAL VENOUS CATHETER PLACEMENT WITH ULTRASOUND AND FLUOROSCOPIC GUIDANCE TECHNIQUE: The procedure, risks, benefits, and alternatives were explained to the patient. Questions regarding the procedure were encouraged and answered. The patient understands and consents to the procedure. Patient was already receiving adequate antibiotic coverage.Patency of the right IJ vein was confirmed with ultrasound with image documentation. An appropriate skin site was determined. Region was prepped using maximum barrier technique including cap and  mask, sterile gown, sterile gloves, large sterile sheet, and Chlorhexidine as cutaneous antisepsis. The region was infiltrated locally with 1% lidocaine. Under real-time ultrasound guidance, the right IJ vein was accessed with a 21 gauge micropuncture needle; the needle tip within the vein was confirmed with ultrasound image documentation. 68F dual-lumen cuffed powerPICC tunneled from a right anterior chest wall approach to the dermatotomy site. Needle exchanged over the 018 guidewire for transitional dilator, through which the catheter which had been cut to 21 cm was advanced under intermittent fluoroscopy, positioned with its tip at the cavoatrial junction. Spot chest radiograph confirms good catheter position. No pneumothorax. Catheter was flushed per protocol. Catheter secured externally with O Prolene suture. The right IJ dermatotomy site was closed with Dermabond. COMPLICATIONS: COMPLICATIONS None immediate FLUOROSCOPY TIME:  0.1 minute, 4.9 uGym2 DAP COMPARISON:  None IMPRESSION: 1. Technically successful placement of tunneled right IJ tunneled dual-lumen power injectable catheter with ultrasound and fluoroscopic guidance. Ready for routine use. Electronically Signed   By: Corlis Leak M.D.   On: 06/10/2015 12:35    Micro Results      Recent Results (from the past 240 hour(s))  Blood culture (routine x 2)     Status: None   Collection Time: 06/04/15 10:40 PM  Result Value Ref Range Status   Specimen Description BLOOD LEFT ANTECUBITAL  Final   Special Requests BOTTLES DRAWN AEROBIC AND ANAEROBIC 5CC  Final   Culture  Setup Time   Final    GRAM POSITIVE COCCI IN CLUSTERS IN BOTH AEROBIC AND ANAEROBIC BOTTLES CRITICAL RESULT CALLED TO, READ BACK BY AND VERIFIED WITH: K MOON 06/05/15 @ 1447 M VESTAL GRAM NEGATIVE RODS ANAEROBIC BOTTLE ONLY CRITICAL RESULT CALLED TO, READ BACK BY AND VERIFIED WITH: RN M LESSARD AT [id number] MARTINB CONFIRMED BY VINCE W    Culture   Final    STAPHYLOCOCCUS  AUREUS KLEBSIELLA SPECIES SUSCEPTIBILITIES PERFORMED ON PREVIOUS CULTURE WITHIN THE LAST 5 DAYS.    Report Status 06/07/2015 FINAL  Final  Blood culture (routine x 2)     Status: None   Collection Time: 06/04/15 10:45 PM  Result Value Ref Range Status   Specimen Description BLOOD LEFT HAND  Final   Special Requests BOTTLES DRAWN AEROBIC AND ANAEROBIC 5CC  Final   Culture  Setup Time   Final    GRAM POSITIVE COCCI IN CLUSTERS IN BOTH AEROBIC AND ANAEROBIC BOTTLES CRITICAL RESULT CALLED TO, READ BACK BY AND VERIFIED WITH: K MOON 06/05/15 @ 1447 M VESTAL GRAM NEGATIVE RODS ANAEROBIC BOTTLE ONLY CRITICAL RESULT CALLED TO, READ BACK BY AND VERIFIED WITH: RN Tilden Fossa AT [phone] MARTINB CONFIRMED BY VINCE W    Culture STAPHYLOCOCCUS AUREUS KLEBSIELLA SPECIES   Final   Report Status 06/07/2015 FINAL  Final   Organism ID, Bacteria STAPHYLOCOCCUS AUREUS  Final   Organism ID, Bacteria KLEBSIELLA SPECIES  Final      Susceptibility   Staphylococcus aureus - MIC*    CIPROFLOXACIN <=0.5 SENSITIVE Sensitive     ERYTHROMYCIN <=0.25 SENSITIVE Sensitive     GENTAMICIN <=0.5 SENSITIVE Sensitive     OXACILLIN <=0.25 SENSITIVE Sensitive     TETRACYCLINE <=1 SENSITIVE Sensitive     VANCOMYCIN <=0.5 SENSITIVE Sensitive     TRIMETH/SULFA <=10 SENSITIVE Sensitive     CLINDAMYCIN <=0.25 SENSITIVE Sensitive     RIFAMPIN <=0.5 SENSITIVE Sensitive     Inducible Clindamycin NEGATIVE Sensitive     * STAPHYLOCOCCUS AUREUS   Klebsiella species - MIC*    AMPICILLIN >=32 RESISTANT Resistant     CEFAZOLIN >=64 RESISTANT Resistant     CEFEPIME <=1 SENSITIVE Sensitive     CEFTAZIDIME <=1 SENSITIVE Sensitive     CEFTRIAXONE <=1 SENSITIVE Sensitive     CIPROFLOXACIN <=0.25 SENSITIVE Sensitive     GENTAMICIN <=1 SENSITIVE Sensitive     IMIPENEM <=0.25 SENSITIVE Sensitive     TRIMETH/SULFA <=20 SENSITIVE Sensitive     AMPICILLIN/SULBACTAM 4 SENSITIVE Sensitive     PIP/TAZO <=4 SENSITIVE Sensitive     *  KLEBSIELLA SPECIES  MRSA PCR Screening     Status: None   Collection Time: 06/05/15  5:51 PM  Result Value Ref Range Status   MRSA by PCR NEGATIVE NEGATIVE Final    Comment:        The GeneXpert MRSA Assay (FDA approved for NASAL specimens only), is one component of a comprehensive MRSA colonization surveillance program. It is not intended to diagnose MRSA infection nor to guide or monitor treatment for MRSA infections.   Culture, blood (routine x 2)     Status: None   Collection Time: 06/05/15  6:06 PM  Result Value Ref Range Status   Specimen Description BLOOD LEFT ANTECUBITAL  Final   Special Requests BOTTLES DRAWN AEROBIC AND ANAEROBIC 5CC  Final   Culture NO GROWTH 5 DAYS  Final   Report Status 06/10/2015 FINAL  Final  Culture, blood (routine x 2)     Status: None   Collection Time: 06/05/15  6:08 PM  Result Value Ref Range Status   Specimen Description BLOOD LEFT HAND  Final   Special Requests BOTTLES DRAWN AEROBIC AND ANAEROBIC 5CC  Final   Culture NO GROWTH 5 DAYS  Final   Report Status 06/10/2015 FINAL  Final       Today   Subjective    Katrina Peck today has no headache,no chest abdominal pain,no new weakness tingling or numbness, feels much better.   Objective   Blood pressure 107/63, pulse 65, temperature 98.2 F (  36.8 C), temperature source Oral, resp. rate 15, height 5\' 2"  (1.575 m), weight 45.3 kg (99 lb 13.9 oz), SpO2 99 %.  No intake or output data in the 24 hours ending 06/13/15 1100  Exam Awake Alert, Oriented x 3, No new F.N deficits, Normal affect Wilson.AT,PERRAL Supple Neck,No JVD, No cervical lymphadenopathy appriciated.  Symmetrical Chest wall movement, Good air movement bilaterally, CTAB RRR,No Gallops,Rubs or new Murmurs, No Parasternal Heave +ve B.Sounds, Abd Soft, Non tender, No organomegaly appriciated, No rebound -guarding or rigidity. No Cyanosis, Clubbing or edema, No new Rash or bruise   Data Review   CBC w Diff:  Lab Results    Component Value Date   WBC 8.2 06/13/2015   WBC 7.6 12/24/2013   HGB 10.7* 06/13/2015   HGB 13.0 12/24/2013   HCT 34.4* 06/13/2015   HCT 42.1 12/24/2013   PLT 366 06/13/2015   LYMPHOPCT 14 06/13/2015   MONOPCT 8 06/13/2015   EOSPCT 2 06/13/2015   BASOPCT 0 06/13/2015    CMP:  Lab Results  Component Value Date   NA 137 06/13/2015   K 3.7 06/13/2015   CL 95* 06/13/2015   CO2 30 06/13/2015   BUN 16 06/13/2015   CREATININE 0.77 06/13/2015   CREATININE 0.64 12/24/2013   PROT 6.7 06/05/2015   ALBUMIN 2.9* 06/05/2015   BILITOT 2.4* 06/05/2015   ALKPHOS 130* 06/05/2015   AST 21 06/05/2015   ALT 25 06/05/2015  .  Lab Results  Component Value Date   INR 2.12* 06/13/2015   INR 1.80* 06/12/2015   INR 2.04* 06/11/2015    Total Time in preparing paper work, data evaluation and todays exam - 35 minutes  Leroy Sea M.D on 06/13/2015 at 11:00 AM  [facility]   Office  [phone]

## 2015-06-13 NOTE — Care Management Note (Signed)
Case Management Note  Patient Details  Name: Katrina Peck MRN: [id number] Date of Birth: Jun 05, 1945  Subjective/Objective:  Pt with history of chronic systolic heart failure-on milrinone infusion at home via a tunneled catheter, presented to the hospital with severe lower back pain. Found to have significant leukocytosis. Further evaluation showed MSSA and Klebsiella bacteremia. 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 Seen by infectious disease, recommendations are to continue IV Rocephin until April 24. Plan for SNF on discharge                   Action/Plan: CSW assisting with disposition needs and the plan is for [facility] 06-13-15. CM did call [name] and she will connect patient to the IV pump for IV Milrinone. No further needs from CM at this time.    Expected Discharge Date:  06/14/15               Expected Discharge Plan:  Home w Home Health Services  In-House Referral:  Clinical Social Work  Discharge planning Services  CM Consult  Post Acute Care Choice:  NA Choice offered to:  NA  DME Arranged:  N/A DME Agency:  NA  HH Arranged:  NA HH Agency:  [facility], NA  Status of Service:  Completed, signed off  Medicare Important Message Given:  Yes Date Medicare IM Given:    Medicare IM give by:    Date Additional Medicare IM Given:    Additional Medicare Important Message give by:     If discussed at Long Length of Stay Meetings, dates discussed:    Additional Comments:  Gala Lewandowsky, RN 06/13/2015, 11:16 AM

## 2015-06-13 NOTE — Care Management Important Message (Signed)
Important Message  Patient Details  Name: Katrina Peck MRN: [id number] Date of Birth: 30-Mar-1945   Medicare Important Message Given:  Yes    Oralia Rud Thursa Emme 06/13/2015, 3:40 PM

## 2015-06-13 NOTE — Progress Notes (Signed)
I met with pt at bedside to discuss inpt rehab venue expectations and goals. I encouraged pt to be up in the chair for 1 -2 hrs per day to assist with her tolerance to do more therapy. Pt currently not at a level for admission to inpt rehab. I will follow. [phone]

## 2015-06-13 NOTE — Progress Notes (Signed)
Physical Therapy Treatment Patient Details Name: Katrina Peck MRN: [id number] DOB: Aug 16, 1945 Today's Date: 06/13/2015    History of Present Illness 70 yo female admitted with severe back pain workup underway to r/o discitis and sepsis.  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  PMH: anemia, HTN, chronic systolic CHF, NICM pericardial effusion    PT Comments    Katrina Peck is making modest progress and requires increased time for all aspects of mobility due to anxiety and guarded posture.  Pt will benefit from continued skilled PT services to increase functional independence and safety.   Follow Up Recommendations  SNF     Equipment Recommendations  Rolling walker with 5" wheels;3in1 (PT)    Recommendations for Other Services       Precautions / Restrictions Precautions Precautions: Fall Restrictions Weight Bearing Restrictions: No    Mobility  Bed Mobility Overal bed mobility: Needs Assistance Bed Mobility: Rolling;Sidelying to Sit Rolling: Min guard Sidelying to sit: Min guard       General bed mobility comments: Step by step cues for log roll technique. Pt requires increased time.    Transfers Overall transfer level: Needs assistance Equipment used: Rolling walker (2 wheeled) Transfers: Sit to/from Stand Sit to Stand: Min guard;+2 safety/equipment         General transfer comment: Cues for hand placement and technique.  Pt very slow to stand.  Hand over hand assist to reach back for armrests when sitting.    Ambulation/Gait Ambulation/Gait assistance: Min guard;+2 safety/equipment Ambulation Distance (Feet): 5 Feet Assistive device: Rolling walker (2 wheeled) Gait Pattern/deviations: Decreased stride length;Antalgic;Trunk flexed   Gait velocity interpretation: <1.8 ft/sec, indicative of risk for recurrent falls General Gait Details: Very decreased gait speed and cues for sequencing.     Stairs            Wheelchair Mobility     Modified Rankin (Stroke Patients Only)       Balance Overall balance assessment: Needs assistance Sitting-balance support: Bilateral upper extremity supported;Feet supported Sitting balance-Leahy Scale: Good     Standing balance support: Bilateral upper extremity supported;During functional activity Standing balance-Leahy Scale: Poor Standing balance comment: Relies on RW for support                    Cognition Arousal/Alertness: Awake/alert Behavior During Therapy: Anxious Overall Cognitive Status: Within Functional Limits for tasks assessed                      Exercises General Exercises - Lower Extremity Hip Flexion/Marching: AROM;Both;5 reps;Standing    General Comments        Pertinent Vitals/Pain Pain Assessment: Faces Faces Pain Scale: Hurts whole lot Pain Location: back Pain Descriptors / Indicators: Grimacing;Guarding;Moaning Pain Intervention(s): Limited activity within patient's tolerance;Monitored during session;Repositioned    Home Living                      Prior Function            PT Goals (current goals can now be found in the care plan section) Acute Rehab PT Goals Patient Stated Goal: to sit up for a few hours today PT Goal Formulation: With patient Time For Goal Achievement: 06/21/15 Potential to Achieve Goals: Good Progress towards PT goals: Progressing toward goals (modestly)    Frequency  Min 3X/week    PT Plan Discharge plan needs to be updated    Co-evaluation PT/OT/SLP Co-Evaluation/Treatment: Yes Reason for Co-Treatment:  For patient/therapist safety;Necessary to address cognition/behavior during functional activity         End of Session   Activity Tolerance: Patient limited by pain;Other (comment) (limited by anxiety) Patient left: with call bell/phone within reach;in chair;with chair alarm set     Time: [phone] PT Time Calculation (min) (ACUTE ONLY): 21 min  Charges:  $Therapeutic  Activity: 8-22 mins                    G Codes:      Encarnacion Chu PT, DPT  Pager: [phone] Phone: [phone] 06/13/2015, 1:17 PM

## 2015-06-14 ENCOUNTER — Encounter: Payer: Self-pay | Admitting: Internal Medicine

## 2015-06-14 ENCOUNTER — Non-Acute Institutional Stay (SKILLED_NURSING_FACILITY): Payer: Medicare Other | Admitting: Internal Medicine

## 2015-06-14 DIAGNOSIS — I5022 Chronic systolic (congestive) heart failure: Secondary | ICD-10-CM | POA: Diagnosis not present

## 2015-06-14 DIAGNOSIS — I319 Disease of pericardium, unspecified: Secondary | ICD-10-CM | POA: Diagnosis not present

## 2015-06-14 DIAGNOSIS — D62 Acute posthemorrhagic anemia: Secondary | ICD-10-CM

## 2015-06-14 DIAGNOSIS — I513 Intracardiac thrombosis, not elsewhere classified: Secondary | ICD-10-CM

## 2015-06-14 DIAGNOSIS — M4644 Discitis, unspecified, thoracic region: Secondary | ICD-10-CM

## 2015-06-14 DIAGNOSIS — I313 Pericardial effusion (noninflammatory): Secondary | ICD-10-CM

## 2015-06-14 DIAGNOSIS — I3139 Other pericardial effusion (noninflammatory): Secondary | ICD-10-CM

## 2015-06-14 DIAGNOSIS — E43 Unspecified severe protein-calorie malnutrition: Secondary | ICD-10-CM

## 2015-06-14 DIAGNOSIS — M542 Cervicalgia: Secondary | ICD-10-CM | POA: Diagnosis not present

## 2015-06-14 DIAGNOSIS — I213 ST elevation (STEMI) myocardial infarction of unspecified site: Secondary | ICD-10-CM

## 2015-06-14 DIAGNOSIS — R911 Solitary pulmonary nodule: Secondary | ICD-10-CM

## 2015-06-14 DIAGNOSIS — J189 Pneumonia, unspecified organism: Secondary | ICD-10-CM

## 2015-06-14 DIAGNOSIS — I480 Paroxysmal atrial fibrillation: Secondary | ICD-10-CM

## 2015-06-14 NOTE — Progress Notes (Addendum)
Patient ID: Katrina Peck, female   DOB: [birth year], 70 y.o.   MRN: [id number]    HISTORY AND PHYSICAL   DATE: 06/14/15  Location:  [facility] Service: SNF [phone])   Extended Emergency Contact Information Primary Emergency Contact: [facility] Address: [address]          [place], [place] [address] Johnnette Litter of [place] Phone: [phone] Mobile Phone: [phone] Relation: Spouse Secondary Emergency Contact: Pleasant,Raneta Address: [address]          [place], [place] [address] [place] Phone: [phone] Mobile Phone: [phone] Relation: Daughter  Advanced Directive information Does patient have an advance directive?: No  Chief Complaint  Patient presents with  . New Admit To SNF    HPI:  70 yo female seen today as a new admission into SNF following hospital stay for bacteremia, sepsis, neck/back pain, SOB due to A/C HF, chronic pericardial effusion, PAF in coumadin, HTN, severe protein calorie malnutrition, acute blood loss anemia, lung nodule, discitis, hx LV thrombus. BC (+) MSSA and Klebsiella and tunneled cath removed by IR on 3/13th. 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 TEE showed no valve vegetations or cardiac tamponode. ID consulted and pt started on IV rocephin which she will takes thru 4/24th. Hgb 10.7 at d/c. CT chest revealed lung nodule and recommended repeat study in 6 mos  She c/o severe pain 8/10 on scale today in back and left shoulder with SOB. She is on milrinone gtt for HF with EF 20%. Appetite reduced. No other concerns. Spouse present  Chronic systolic HF/HTN/NICM/PAF - on milrinone gtt. Takes demadex with Kdur, spironolactone, digoxin and amiodarone. Coumadin for anticoagulation  Chronic pericardial effusion - stable on TEE  Anemia - stable. Hgb 10.7  hx LV thrombus - on coumadin  She has nutritional supplements. Albumin 2.9  Past Medical History  Diagnosis Date    . Anemia   . Hypertension   . Blood transfusion without reported diagnosis   . Chronic systolic CHF (congestive heart failure) ([place])     a. 06/2014 Echo: EF 20%.  Marland Kitchen NICM (nonischemic cardiomyopathy) ([place])     a. 06/2014 Echo: EF 20%, diff HK, mild MR, mildly dil LA/RA, mildly reduced RV fxn;  b. 06/2014 Cath: nl cors.  . Pericardial effusion     a. 06/2014 Large effusion, no tamponade phys.    Past Surgical History  Procedure Laterality Date  . No past surgeries    . Left and right heart catheterization with coronary angiogram N/A 07/23/2014    Procedure: LEFT AND RIGHT HEART CATHETERIZATION WITH CORONARY ANGIOGRAM;  Surgeon: Larey Dresser, MD;  Location: [facility] CATH LAB;  Service: Cardiovascular;  Laterality: N/A;  . Subxyphoid pericardial window N/A 07/26/2014    Procedure: SUBXYPHOID PERICARDIAL WINDOW;  Surgeon: Grace Isaac, MD;  Location: [place];  Service: Thoracic;  Laterality: N/A;  . Tee without cardioversion N/A 07/26/2014    Procedure: TRANSESOPHAGEAL ECHOCARDIOGRAM (TEE);  Surgeon: Grace Isaac, MD;  Location: [facility];  Service: Thoracic;  Laterality: N/A;  . Pleural effusion drainage N/A 07/26/2014    Procedure: DRAINAGE OF PERICARDIAL EFFUSION;  Surgeon: Grace Isaac, MD;  Location: [place];  Service: Thoracic;  Laterality: N/A;  . Cardiac catheterization N/A 02/07/2015    Procedure: Right Heart Cath;  Surgeon: Larey Dresser, MD;  Location: [place] CV LAB;  Service: Cardiovascular;  Laterality: N/A;  . Tee without cardioversion N/A  06/06/2015    Procedure: TRANSESOPHAGEAL ECHOCARDIOGRAM (TEE);  Surgeon: Jolaine Artist, MD;  Location: [facility] ENDOSCOPY;  Service: Cardiovascular;  Laterality: N/A;  . Radiology with anesthesia N/A 06/08/2015    Procedure: MRI CERVICAL SPINE WITH/WITHOUT LUMBAR WITH/WITHOUT THORACIC WITH/WITHOUT       (RADIOLOGY WITH ANESTHESIA);  Surgeon: Medication Radiologist, MD;  Location: [place];  Service: Radiology;  Laterality: N/A;    Patient Care  Team: Fanny Bien, MD as PCP - General (Family Medicine)  Social History   Social History  . Marital Status: Married    Spouse Name: N/A  . Number of Children: N/A  . Years of Education: N/A   Occupational History  . Not on file.   Social History Main Topics  . Smoking status: Never Smoker   . Smokeless tobacco: Never Used  . Alcohol Use: No  . Drug Use: No  . Sexual Activity: No   Other Topics Concern  . Not on file   Social History Narrative   Lives in [place] with husband.  Not currently working.     reports that she has never smoked. She has never used smokeless tobacco. She reports that she does not drink alcohol or use illicit drugs.  Family History  Problem Relation Age of Onset  . Hypertension Sister   . Diabetes Maternal Grandmother   . Cancer Mother     died young (pt was only in [employer] @ the time)  . Other Father     died in his 70's.   Family Status  Relation Status Death Age  . Sister Alive   . Maternal Grandmother Deceased      There is no immunization history on file for this patient.  No Known Allergies  Medications: Patient's Medications  New Prescriptions   No medications on file  Previous Medications   AMIODARONE (PACERONE) 100 MG TABLET    Take 1 tablet (100 mg total) by mouth daily.   CEFTRIAXONE 2 G IN DEXTROSE 5 % 50 ML    Inject 2 g into the vein daily. Tentative stop date 06/28/2015, patient must follow with ID clinic within 1-2 weeks. Final stop date per ID. Please draw CBC and CMP once a week and fax the results to the ID office of Dr. Megan Salon.   DIGOXIN 62.5 MCG TABS    Take 0.0625 mg by mouth daily.   FEEDING SUPPLEMENT, ENSURE ENLIVE, (ENSURE ENLIVE) LIQD    Take 237 mLs by mouth 2 (two) times daily between meals.   MAGNESIUM OXIDE 200 MG TABS    Take 1 tablet (200 mg total) by mouth 2 (two) times daily.   MILRINONE (PRIMACOR) 20 MG/100ML SOLN INFUSION    Inject 11.6 mcg/min into the vein continuous.    OXYCODONE-ACETAMINOPHEN (PERCOCET/ROXICET) 5-325 MG TABLET    Take 1 tablet by mouth every 4 (four) hours as needed for moderate pain.   POTASSIUM CHLORIDE SA (K-DUR,KLOR-CON) 20 MEQ TABLET    Take 2 tablets (40 mEq total) by mouth daily.   SPIRONOLACTONE (ALDACTONE) 25 MG TABLET    Take 0.5 tablets (12.5 mg total) by mouth daily.   TORSEMIDE (DEMADEX) 20 MG TABLET    Take 1 tablet (20 mg total) by mouth daily.   WARFARIN (COUMADIN) 2 MG TABLET    Take 2 mg by mouth daily.  Modified Medications   No medications on file  Discontinued Medications   WARFARIN (COUMADIN) 2 MG TABLET    Take as directed by Coumadin Clinic  Review of Systems  Constitutional: Positive for appetite change and fatigue.  Respiratory: Positive for shortness of breath.   Cardiovascular: Positive for chest pain.  Musculoskeletal: Positive for back pain, arthralgias and gait problem.  Neurological: Positive for weakness.  All other systems reviewed and are negative.   Filed Vitals:   06/14/15 0906  BP: 100/55  Temp: 98.4 F (36.9 C)  TempSrc: Oral  Resp: 18  Height: _0  (1.575 m)  Weight: 99 lb (44.906 kg)  SpO2: 98%   Body mass index is 18.1 kg/(m^2).  Physical Exam  Constitutional: She appears well-developed.  Frail appearing in NAD, sitting on edge of bed. Spouse present  HENT:  Mouth/Throat: Oropharynx is clear and moist. No oropharyngeal exudate.  Eyes: Pupils are equal, round, and reactive to light. No scleral icterus.  Neck: Neck supple. Carotid bruit is not present. No tracheal deviation present. No thyromegaly present.  Cardiovascular: Normal rate and intact distal pulses.  An irregular rhythm present. Exam reveals friction rub. Exam reveals no gallop.   Murmur heard.  Systolic murmur is present with a grade of 1/6  No LE edema b/l. no calf TTP.   Pulmonary/Chest: Effort normal. No stridor. No respiratory distress. She has decreased breath sounds (poor inspiratory effort). She has no  wheezes. She has no rales.  Right IJ tunneled line intact with no secondary signs of infection at insertion site  Abdominal: Soft. Bowel sounds are normal. She exhibits no distension and no mass. There is no hepatomegaly. There is tenderness (generalized). There is no rebound and no guarding.  Musculoskeletal: She exhibits edema and tenderness.       Back:  Lymphadenopathy:    She has no cervical adenopathy.  Neurological: She is alert.  Skin: Skin is warm and dry. No rash noted.  B/l leg post inflammatory changes   Psychiatric: She has a normal mood and affect. Her behavior is normal.     Labs reviewed: Nursing Home on 06/14/2015  Component Date Value Ref Range Status  . WBC 06/13/2015 8.2   Final  Admission on 06/04/2015, Discharged on 06/13/2015  No results displayed because visit has over 200 results.  CBC Latest Ref Rng 06/13/2015 06/13/2015 06/12/2015  WBC 4.0 - 10.5 K/uL 8.2 8.2 8.8  Hemoglobin 12.0 - 15.0 g/dL 10.7(L) - 10.3(L)  Hematocrit 36.0 - 46.0 % 34.4(L) - 33.0(L)  Platelets 150 - 400 K/uL 366 - 348    CMP Latest Ref Rng 06/13/2015 06/12/2015 06/11/2015  Glucose 65 - 99 mg/dL 121(H) 126(H) 97  BUN 6 - 20 mg/dL _1 Creatinine 0.44 - 1.00 mg/dL 0.77 0.79 0.75  Sodium 135 - 145 mmol/L 137 135 135  Potassium 3.5 - 5.1 mmol/L 3.7 3.9 3.9  Chloride 101 - 111 mmol/L 95(L) 95(L) 96(L)  CO2 22 - 32 mmol/L _2 Calcium 8.9 - 10.3 mg/dL 9.4 9.1 9.1      Anti-coag visit on 05/30/2015  Component Date Value Ref Range Status  . INR 05/30/2015 1.6   Final   [name]  Anti-coag visit on 05/19/2015  Component Date Value Ref Range Status  . INR 05/19/2015 1.6   Final   [facility] Visit on 05/05/2015  Component Date Value Ref Range Status  . Sodium 05/05/2015 143  135 - 145 mmol/L Final  . Potassium 05/05/2015 4.6  3.5 - 5.1 mmol/L Final  . Chloride 05/05/2015 110  101 - 111 mmol/L Final  . CO2 05/05/2015 22  22 -  32 mmol/L Final  . Glucose, Bld  05/05/2015 103* 65 - 99 mg/dL Final  . BUN 05/05/2015 22* 6 - 20 mg/dL Final  . Creatinine, Ser 05/05/2015 0.98  0.44 - 1.00 mg/dL Final  . Calcium 05/05/2015 9.3  8.9 - 10.3 mg/dL Final  . Total Protein 05/05/2015 6.4* 6.5 - 8.1 g/dL Final  . Albumin 05/05/2015 3.3* 3.5 - 5.0 g/dL Final  . AST 05/05/2015 31  15 - 41 U/L Final  . ALT 05/05/2015 25  14 - 54 U/L Final  . Alkaline Phosphatase 05/05/2015 97  38 - 126 U/L Final  . Total Bilirubin 05/05/2015 1.3* 0.3 - 1.2 mg/dL Final  . GFR calc non Af Amer 05/05/2015 58* >60 mL/min Final  . GFR calc Af Amer 05/05/2015 >60  >60 mL/min Final   Comment: (NOTE) The eGFR has been calculated using the CKD EPI equation. This calculation has not been validated in all clinical situations. eGFR's persistently <60 mL/min signify possible Chronic Kidney Disease.   . Anion gap 05/05/2015 11  5 - 15 Final  . Total Protein 05/05/2015 7.0  6.5 - 8.1 g/dL Final  . Albumin 05/05/2015 3.2* 3.5 - 5.0 g/dL Final  . AST 05/05/2015 30  15 - 41 U/L Final  . ALT 05/05/2015 28  14 - 54 U/L Final  . Alkaline Phosphatase 05/05/2015 96  38 - 126 U/L Final  . Total Bilirubin 05/05/2015 1.5* 0.3 - 1.2 mg/dL Final  . Bilirubin, Direct 05/05/2015 0.3  0.1 - 0.5 mg/dL Final  . Indirect Bilirubin 05/05/2015 1.2* 0.3 - 0.9 mg/dL Final  . Digoxin Level 05/05/2015 <0.2* 0.8 - 2.0 ng/mL Final   RESULTS CONFIRMED BY MANUAL DILUTION  Anti-coag visit on 05/05/2015  Component Date Value Ref Range Status  . INR 05/05/2015 2.1   Final  Anti-coag visit on 04/28/2015  Component Date Value Ref Range Status  . INR 04/28/2015 1.5   Final   [name]  Anti-coag visit on 04/21/2015  Component Date Value Ref Range Status  . INR 04/21/2015 2.7   Final   [name]  Anti-coag visit on 04/14/2015  Component Date Value Ref Range Status  . INR 04/14/2015 2.3   Final   [name]   Anti-coag visit on 04/07/2015  Component Date Value Ref Range Status  . INR 04/07/2015 2.0   Final    [name]  There may be more visits with results that are not included.  Ct Chest Wo Contrast  06/09/2015  CLINICAL DATA:  Lung nodule. Hx HTN, CHF, NICM, and pericardial effusion. EXAM: CT CHEST WITHOUT CONTRAST TECHNIQUE: Multidetector CT imaging of the chest was performed following the standard protocol without IV contrast. COMPARISON:  06/04/2015, 07/24/2014 FINDINGS: Heart: There is a large pericardial effusion, measuring 3.5 cm adjacent to the right atrium. Aortic valve calcification. No significant coronary artery calcification. Vascular structures: There is mild atherosclerotic calcification of the thoracic aorta. No associated aneurysm. Pulmonary arteries have a normal noncontrast appearance. Mediastinum/thyroid: The visualized portion of the thyroid gland has a normal appearance. No mediastinal, hilar, or axillary adenopathy. Lungs/Airways: There are bilateral pleural effusions, right greater than left. Associated bilateral lower lobe infiltrates are also noted, right greater than left. Bibasilar calcifications are noted in the lung parenchyma, of indeterminate significance and stable since prior study. Adjacent to the left major fissure there is a small opacity which measures 8 x 5 mm and contains a central lucency. Upper abdomen: There are parenchymal calcifications within the kidneys bilaterally. Chest wall/osseous  structures: Midthoracic spondylosis. IMPRESSION: 1. Large pericardial effusion. 2. Small nodule within the left major fissure, with diameter of 7 mm. This is favored be benign, possibly related to pleural effusion, a pseudo tumor. Follow-up CT of the chest is recommended in 6 months to document resolution. 3. Small bilateral pleural effusions, right greater than left. 4. Right greater than left lower lobe consolidation. Electronically Signed   By: Nolon Nations M.D.   On: 06/09/2015 14:00   Mr Cervical Spine Wo Contrast  06/08/2015  CLINICAL DATA:  Back pain, worsening recently.  EXAM: MRI CERVICAL SPINE WITHOUT CONTRAST TECHNIQUE: Multiplanar, multisequence MR imaging of the cervical spine was performed. No intravenous contrast was administered. COMPARISON:  None. FINDINGS: Foramen magnum is widely patent. C1-2 is normal. 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 C3-4: Spondylosis with endplate osteophytes and bulging of the disc. Facet arthropathy left worse than right with some edema of the facet joint on the left. Narrowing of the subarachnoid space surrounding the cord. AP diameter of the canal only 7 mm at this level. Mild deformity of the cord. Foraminal stenosis bilaterally because of osteophytic encroachment, left worse than right because of the facet arthropathy. C4-5: Spondylosis with endplate osteophytes and bulging of the disc. Effacement of the subarachnoid space surrounding the cord with slight cord deformity. AP diameter of the canal only 7 mm. Foraminal stenosis bilaterally because of osteophytic encroachment. C5-6: Spondylosis with endplate osteophytes and bulging disc material more pronounced towards the right. Effacement of the subarachnoid space surrounding the cord. AP diameter of the canal is 9 mm. Foraminal stenosis on the right that could compress the C6 nerve root. C6-7: Spondylosis with endplate osteophytes and bulging of the disc. Narrowing of the subarachnoid space surrounding the cord. AP diameter of the canal measures 9 mm. Foraminal stenosis right worse than left that could cause neural compression. C7-T1: Spondylosis with endplate osteophytes and bulging of the disc. No compressive narrowing of the central canal. Mild foraminal narrowing on the right and moderate foraminal narrowing on the left. No abnormal cord edema. 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 Canal stenosis at C3-4 and C4-5 with AP diameter of canal only 7 mm at those levels. Pronounced facet arthropathy on  the left at C3-4 with edema. This could be a cause of left-sided neck pain. Foraminal stenosis on the left at C3-4 could cause neural compression. Foraminal stenosis bilaterally at C4-5 could cause neural compression. Spondylosis at C5-6 and C6-7 with canal narrowing to a diameter of 9 mm. Foraminal stenosis bilaterally at those levels that could compress either or both C6 or C7 nerve roots. Foraminal narrowing on the left at C7-T1 that could be symptomatic. Electronically Signed   By: Nelson Chimes M.D.   On: 06/08/2015 18:35   Mr Lumbar Spine Wo Contrast  06/08/2015  CLINICAL DATA:  Back pain, acute worsening. EXAM: MRI LUMBAR SPINE WITHOUT CONTRAST TECHNIQUE: Multiplanar, multisequence MR imaging of the lumbar spine was performed. No intravenous contrast was administered. COMPARISON:  CT abdomen 06/05/2015 FINDINGS: Sagittal STIR imaging was performed. We are asked to abbreviated the study by the clinical service. There are non-compressive disc bulges at L2-3, L3-4, L4-5 and L5-S1. There is facet arthropathy at L4-5 and L5-S1 with 2 mm of anterolisthesis at L4-5. There is not appear to be any compressive stenosis of the central canal. Lower lumbar facet arthropathy could contribute to back pain, but this would likely be chronic. IMPRESSION: Degenerative disc  disease and degenerative facet disease in the lower lumbar spine but without evidence of severe stenosis or likely neural compression. The facet arthropathy could be associated with low back pain. Electronically Signed   By: Nelson Chimes M.D.   On: 06/08/2015 18:37   Mr Thoracic Spine W Wo Contrast  06/08/2015  CLINICAL DATA:  Severe back pain with acute worsening. EXAM: MRI THORACIC SPINE WITHOUT AND WITH CONTRAST TECHNIQUE: Multiplanar and multiecho pulse sequences of the thoracic spine were obtained without and with intravenous contrast. CONTRAST:  38m MULTIHANCE GADOBENATE DIMEGLUMINE 529 MG/ML IV SOLN COMPARISON:  None. FINDINGS: T1-2:  Normal.  T2-3:  Bulging of the disc.  No compressive stenosis. T3-4 through T10-11: Normal. No disc pathology. No fracture or focal bone lesion. The patient does have a layering pleural effusion on the right. T11-12: Spondyloarthropathy at this level. Mild edema within the disc space and of the adjacent endplates at TA76and TO11 The surrounding soft tissues appear to show edematous change. This appearance could be due to single level degenerative spondyloarthropathy, but the possibility of discitis osteomyelitis certainly exists on the basis of the imaging. 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 Edema within the disc and within the T11 and T12 vertebral bodies. Surrounding edematous change in the paraspinous soft tissues. No evidence of epidural collection or neural compression. Sterile degenerative spondylosis can have this appearance, particularly in dialysis patients, but there is considerable concern for infection in this case. Electronically Signed   By: MNelson ChimesM.D.   On: 06/08/2015 18:42   Ct Abdomen Pelvis W Contrast  06/05/2015  CLINICAL DATA:  Back spasms.  Leukocytosis. EXAM: CT ABDOMEN AND PELVIS WITH CONTRAST TECHNIQUE: Multidetector CT imaging of the abdomen and pelvis was performed using the standard protocol following bolus administration of intravenous contrast. CONTRAST:  747mOMNIPAQUE IOHEXOL 300 MG/ML  SOLN COMPARISON:  None. FINDINGS: Lower chest and abdominal wall: Chronic cardiomegaly. Remote subendocardial left ventricular infarct, greatest along the anterior wall. Chronic large pericardial effusion when compared to previous CT. Chronic small right pleural effusion with atelectasis or scar. Hepatobiliary: Liver enhancement pattern suggests passive congestion. No focal lesion.Cholelithiasis without evidence of acute cholecystitis. No duct enlargement Pancreas: Unremarkable. Spleen: Unremarkable. Adrenals/Urinary Tract: Negative adrenals. Bilateral renal  cortical thinning. Small nonobstructive bilateral renal calculi. Renal sinus cysts. Chronic hemorrhagic or proteinaceous cyst in the upper pole left kidney measuring 13 mm, also seen on April 2016 chest CT. Unremarkable decompressed bladder. Reproductive:Multiple uterine fibroids measuring up to 23 mm. Stomach/Bowel: No obstruction. Colonic diverticulosis without active inflammation. No evidence of appendicitis. Vascular/Lymphatic: No acute vascular abnormality. No mass or adenopathy. Peritoneal: Small ascites Musculoskeletal: No acute abnormalities. IMPRESSION: 1. No acute finding. 2. Remote left ventricular infarct with chronic cardiomegaly. Chronic large pericardial effusion and small right pleural effusion. 3. Passive congestion of the liver. 4. Cholelithiasis and nephrolithiasis. 5. Colonic diverticulosis. Electronically Signed   By: JoMonte Fantasia.D.   On: 06/05/2015 01:33   Ir UsK[place]auide Vasc Access Right  06/10/2015  CLINICAL DATA:  Chronic systolic CHF, needs long-term venous access for IV milrinone. Recent bacteremia. EXAM: TUNNELED CENTRAL VENOUS CATHETER PLACEMENT WITH ULTRASOUND AND FLUOROSCOPIC GUIDANCE TECHNIQUE: The procedure, risks, benefits, and alternatives were explained to the patient. Questions regarding the procedure were encouraged and answered. The patient understands and consents to the procedure. Patient was already receiving adequate antibiotic coverage.Patency of the right IJ vein was confirmed with ultrasound with image documentation. An appropriate skin site was determined. Region was  prepped using maximum barrier technique including cap and mask, sterile gown, sterile gloves, large sterile sheet, and Chlorhexidine as cutaneous antisepsis. The region was infiltrated locally with 1% lidocaine. Under real-time ultrasound guidance, the right IJ vein was accessed with a 21 gauge micropuncture needle; the needle tip within the vein was confirmed with ultrasound image documentation. 60F  dual-lumen cuffed powerPICC tunneled from a right anterior chest wall approach to the dermatotomy site. Needle exchanged over the 018 guidewire for transitional dilator, through which the catheter which had been cut to 21 cm was advanced under intermittent fluoroscopy, positioned with its tip at the cavoatrial junction. Spot chest radiograph confirms good catheter position. No pneumothorax. Catheter was flushed per protocol. Catheter secured externally with O Prolene suture. The right IJ dermatotomy site was closed with Dermabond. COMPLICATIONS: COMPLICATIONS None immediate FLUOROSCOPY TIME:  0.1 minute, 4.9 uGym2 DAP COMPARISON:  None IMPRESSION: 1. Technically successful placement of tunneled right IJ tunneled dual-lumen power injectable catheter with ultrasound and fluoroscopic guidance. Ready for routine use. Electronically Signed   By: Lucrezia Europe M.D.   On: 06/10/2015 12:35   Dg Chest Port 1 View  06/04/2015  CLINICAL DATA:  Shortness of breath EXAM: PORTABLE CHEST 1 VIEW COMPARISON:  03/22/2015 chest radiograph. FINDINGS: Right internal jugular central venous catheter terminates at the cavoatrial junction. Stable cardiomediastinal silhouette with moderate cardiomegaly. No pneumothorax. Small bilateral pleural effusions. Mild pulmonary edema. Mild bibasilar atelectasis. There is a new subcentimeter nodular density in the peripheral left mid lung. IMPRESSION: 1. Moderate cardiomegaly and mild pulmonary edema, in keeping with mild congestive heart failure. 2. Small bilateral pleural effusions and mild bibasilar atelectasis. 3. New subcentimeter nodular density in the peripheral left mid lung, cannot exclude a pulmonary nodule. Recommend correlation with chest CT on a short term outpatient basis. Electronically Signed   By: Ilona Sorrel M.D.   On: 06/04/2015 20:44   Ir Fluoro Guide Cv Midline Picc Right  06/10/2015  CLINICAL DATA:  Chronic systolic CHF, needs long-term venous access for IV milrinone. Recent  bacteremia. EXAM: TUNNELED CENTRAL VENOUS CATHETER PLACEMENT WITH ULTRASOUND AND FLUOROSCOPIC GUIDANCE TECHNIQUE: The procedure, risks, benefits, and alternatives were explained to the patient. Questions regarding the procedure were encouraged and answered. The patient understands and consents to the procedure. Patient was already receiving adequate antibiotic coverage.Patency of the right IJ vein was confirmed with ultrasound with image documentation. An appropriate skin site was determined. Region was prepped using maximum barrier technique including cap and mask, sterile gown, sterile gloves, large sterile sheet, and Chlorhexidine as cutaneous antisepsis. The region was infiltrated locally with 1% lidocaine. Under real-time ultrasound guidance, the right IJ vein was accessed with a 21 gauge micropuncture needle; the needle tip within the vein was confirmed with ultrasound image documentation. 60F dual-lumen cuffed powerPICC tunneled from a right anterior chest wall approach to the dermatotomy site. Needle exchanged over the 018 guidewire for transitional dilator, through which the catheter which had been cut to 21 cm was advanced under intermittent fluoroscopy, positioned with its tip at the cavoatrial junction. Spot chest radiograph confirms good catheter position. No pneumothorax. Catheter was flushed per protocol. Catheter secured externally with O Prolene suture. The right IJ dermatotomy site was closed with Dermabond. COMPLICATIONS: COMPLICATIONS None immediate FLUOROSCOPY TIME:  0.1 minute, 4.9 uGym2 DAP COMPARISON:  None IMPRESSION: 1. Technically successful placement of tunneled right IJ tunneled dual-lumen power injectable catheter with ultrasound and fluoroscopic guidance. Ready for routine use. Electronically Signed   By: Eden Emms.D.  On: 06/10/2015 12:35   Facility CXR (06/14/15) - RLL pneumonia  Assessment/Plan   ICD-9-CM ICD-10-CM   1. HCAP (healthcare-associated pneumonia) 28 J18.9     RLL  2. Discitis of thoracic region - with back pain 722.92 M46.44   3. Neck pain - MRI shows spinal stenosis with possible cord impingement 723.1 M54.2   4. Chronic systolic heart failure (HCC) 428.22 I50.22   5. Pericardial effusion - large but stable 423.9 I31.9   6. Protein-calorie malnutrition, severe ([place]) 262 E43   7. LV (left ventricular) mural thrombus (HCC) - stable 410.90 I21.3   8. Paroxysmal atrial fibrillation (HCC) 427.31 I48.0   9. Lung nodule as seen on CT chest; left side 8x9m 793.11 R91.1   10. Acute blood loss anemia - stable 285.1 D62     Start azithromycin 5023mpo daily x 10  Increase percocet 7.5/325 q4hr prn mod-sev pain; hold for sedation/respiratory depression  Cont other meds as ordered. IV rocephin thru 4/24th  Monitor central line site qshift  HH to follow Milrinone gtt  Will repeat CXR if sx's persist  Repeat CT chest  In 6 mos for lung nodule  Weekly cbc and cmp  follow INR - goal 2-3  F/u with ID and cardio as scheduled  PT/OT/ST as ordered  Cont nutritional supplements  GOAL: short term rehab and d/c home when medically appropriate. Communicated with pt and nursing.  Will follow  Shantinique Picazo S. CaPer[facility] [address] [place] [phone] (Monday-Friday 8 AM - 5 PM) ([phone] 5 PM and follow prompts

## 2015-06-15 ENCOUNTER — Non-Acute Institutional Stay (SKILLED_NURSING_FACILITY): Payer: Medicare Other | Admitting: Adult Health

## 2015-06-15 ENCOUNTER — Encounter: Payer: Self-pay | Admitting: Adult Health

## 2015-06-15 DIAGNOSIS — I4891 Unspecified atrial fibrillation: Secondary | ICD-10-CM | POA: Diagnosis not present

## 2015-06-15 DIAGNOSIS — Z7901 Long term (current) use of anticoagulants: Secondary | ICD-10-CM | POA: Diagnosis not present

## 2015-06-15 DIAGNOSIS — I110 Hypertensive heart disease with heart failure: Secondary | ICD-10-CM | POA: Diagnosis not present

## 2015-06-15 DIAGNOSIS — R0602 Shortness of breath: Secondary | ICD-10-CM | POA: Diagnosis not present

## 2015-06-15 DIAGNOSIS — I480 Paroxysmal atrial fibrillation: Secondary | ICD-10-CM

## 2015-06-15 DIAGNOSIS — R911 Solitary pulmonary nodule: Secondary | ICD-10-CM | POA: Diagnosis not present

## 2015-06-15 DIAGNOSIS — D62 Acute posthemorrhagic anemia: Secondary | ICD-10-CM | POA: Diagnosis not present

## 2015-06-15 DIAGNOSIS — A4101 Sepsis due to Methicillin susceptible Staphylococcus aureus: Secondary | ICD-10-CM | POA: Diagnosis not present

## 2015-06-15 DIAGNOSIS — I10 Essential (primary) hypertension: Secondary | ICD-10-CM | POA: Diagnosis not present

## 2015-06-15 DIAGNOSIS — I272 Other secondary pulmonary hypertension: Secondary | ICD-10-CM | POA: Diagnosis not present

## 2015-06-15 DIAGNOSIS — M549 Dorsalgia, unspecified: Secondary | ICD-10-CM | POA: Diagnosis not present

## 2015-06-15 DIAGNOSIS — I319 Disease of pericardium, unspecified: Secondary | ICD-10-CM | POA: Diagnosis not present

## 2015-06-15 DIAGNOSIS — I5022 Chronic systolic (congestive) heart failure: Secondary | ICD-10-CM

## 2015-06-15 DIAGNOSIS — E440 Moderate protein-calorie malnutrition: Secondary | ICD-10-CM | POA: Diagnosis not present

## 2015-06-15 DIAGNOSIS — M5441 Lumbago with sciatica, right side: Secondary | ICD-10-CM | POA: Diagnosis not present

## 2015-06-15 DIAGNOSIS — Z79899 Other long term (current) drug therapy: Secondary | ICD-10-CM | POA: Diagnosis not present

## 2015-06-15 DIAGNOSIS — M6281 Muscle weakness (generalized): Secondary | ICD-10-CM | POA: Diagnosis not present

## 2015-06-15 DIAGNOSIS — R262 Difficulty in walking, not elsewhere classified: Secondary | ICD-10-CM | POA: Diagnosis not present

## 2015-06-15 DIAGNOSIS — I213 ST elevation (STEMI) myocardial infarction of unspecified site: Secondary | ICD-10-CM | POA: Diagnosis not present

## 2015-06-15 DIAGNOSIS — I5041 Acute combined systolic (congestive) and diastolic (congestive) heart failure: Secondary | ICD-10-CM | POA: Diagnosis not present

## 2015-06-15 DIAGNOSIS — J189 Pneumonia, unspecified organism: Secondary | ICD-10-CM | POA: Diagnosis not present

## 2015-06-15 DIAGNOSIS — I429 Cardiomyopathy, unspecified: Secondary | ICD-10-CM

## 2015-06-15 DIAGNOSIS — I5043 Acute on chronic combined systolic (congestive) and diastolic (congestive) heart failure: Secondary | ICD-10-CM | POA: Diagnosis not present

## 2015-06-15 DIAGNOSIS — A414 Sepsis due to anaerobes: Secondary | ICD-10-CM | POA: Diagnosis not present

## 2015-06-15 DIAGNOSIS — R7881 Bacteremia: Secondary | ICD-10-CM | POA: Diagnosis not present

## 2015-06-15 DIAGNOSIS — I513 Intracardiac thrombosis, not elsewhere classified: Secondary | ICD-10-CM

## 2015-06-15 DIAGNOSIS — I428 Other cardiomyopathies: Secondary | ICD-10-CM

## 2015-06-15 DIAGNOSIS — Z9119 Patient's noncompliance with other medical treatment and regimen: Secondary | ICD-10-CM | POA: Diagnosis not present

## 2015-06-15 DIAGNOSIS — A419 Sepsis, unspecified organism: Secondary | ICD-10-CM | POA: Diagnosis not present

## 2015-06-15 DIAGNOSIS — E43 Unspecified severe protein-calorie malnutrition: Secondary | ICD-10-CM | POA: Diagnosis not present

## 2015-06-15 DIAGNOSIS — D649 Anemia, unspecified: Secondary | ICD-10-CM | POA: Diagnosis not present

## 2015-06-15 DIAGNOSIS — M4644 Discitis, unspecified, thoracic region: Secondary | ICD-10-CM

## 2015-06-15 DIAGNOSIS — I313 Pericardial effusion (noninflammatory): Secondary | ICD-10-CM | POA: Diagnosis not present

## 2015-06-15 DIAGNOSIS — Z86718 Personal history of other venous thrombosis and embolism: Secondary | ICD-10-CM | POA: Diagnosis not present

## 2015-06-15 DIAGNOSIS — M542 Cervicalgia: Secondary | ICD-10-CM | POA: Diagnosis not present

## 2015-06-15 DIAGNOSIS — T80219D Unspecified infection due to central venous catheter, subsequent encounter: Secondary | ICD-10-CM | POA: Diagnosis not present

## 2015-06-15 NOTE — Progress Notes (Signed)
Patient ID: Katrina Peck, female   DOB: 24-Mar-1946, 70 y.o.   MRN: [id number]   Facility:  [facility]       No Known Allergies  Chief Complaint  Patient presents with  . Hospitalization Follow-up    Hospital Follow up    HPI:  She has been hospitalized doe sepsis from MSSA and klebsiella from T11-T12 disciitis.  She will be on long term IV abt and will be followed by I/D. She has systolic heart failure; non-ischemic cardiomyopathy; left ventricular thrombus. She is here for short term rehab. Her goal is to return home.   Past Medical History  Diagnosis Date  . Anemia   . Hypertension   . Blood transfusion without reported diagnosis   . Chronic systolic CHF (congestive heart failure) ([place])     a. 06/2014 Echo: EF 20%.  Marland Kitchen NICM (nonischemic cardiomyopathy) ([place])     a. 06/2014 Echo: EF 20%, diff HK, mild MR, mildly dil LA/RA, mildly reduced RV fxn;  b. 06/2014 Cath: nl cors.  . Pericardial effusion     a. 06/2014 Large effusion, no tamponade phys.    Past Surgical History  Procedure Laterality Date  . No past surgeries    . Left and right heart catheterization with coronary angiogram N/A 07/23/2014    Procedure: LEFT AND RIGHT HEART CATHETERIZATION WITH CORONARY ANGIOGRAM;  Surgeon: Larey Dresser, MD;  Location: [facility] CATH LAB;  Service: Cardiovascular;  Laterality: N/A;  . Subxyphoid pericardial window N/A 07/26/2014    Procedure: SUBXYPHOID PERICARDIAL WINDOW;  Surgeon: Grace Isaac, MD;  Location: [facility];  Service: Thoracic;  Laterality: N/A;  . Tee without cardioversion N/A 07/26/2014    Procedure: TRANSESOPHAGEAL ECHOCARDIOGRAM (TEE);  Surgeon: Grace Isaac, MD;  Location: [facility];  Service: Thoracic;  Laterality: N/A;  . Pleural effusion drainage N/A 07/26/2014    Procedure: DRAINAGE OF PERICARDIAL EFFUSION;  Surgeon: Grace Isaac, MD;  Location: [facility];  Service: Thoracic;  Laterality: N/A;  . Cardiac catheterization N/A 02/07/2015    Procedure: Right Heart Cath;   Surgeon: Larey Dresser, MD;  Location: [facility] CV LAB;  Service: Cardiovascular;  Laterality: N/A;  . Tee without cardioversion N/A 06/06/2015    Procedure: TRANSESOPHAGEAL ECHOCARDIOGRAM (TEE);  Surgeon: Jolaine Artist, MD;  Location: [facility] ENDOSCOPY;  Service: Cardiovascular;  Laterality: N/A;  . Radiology with anesthesia N/A 06/08/2015    Procedure: MRI CERVICAL SPINE WITH/WITHOUT LUMBAR WITH/WITHOUT THORACIC WITH/WITHOUT       (RADIOLOGY WITH ANESTHESIA);  Surgeon: Medication Radiologist, MD;  Location: [facility];  Service: Radiology;  Laterality: N/A;   Family History  Problem Relation Age of Onset  . Hypertension Sister   . Diabetes Maternal Grandmother   . Cancer Mother     died young (pt was only in [employer] @ the time)  . Other Father     died in his 62's.   Social History   Social History  . Marital Status: Married    Spouse Name: N/A  . Number of Children: N/A  . Years of Education: N/A   Occupational History  . Not on file.   Social History Main Topics  . Smoking status: Never Smoker   . Smokeless tobacco: Never Used  . Alcohol Use: No  . Drug Use: No  . Sexual Activity: No   Other Topics Concern  . Not on file   Social History Narrative   Lives in [place] with husband.  Not currently working.  There is no immunization history on file for this patient.     VITAL SIGNS BP 100/55 mmHg  Temp(Src) 98.4 F (36.9 C) (Oral)  Resp 18  Ht '5\' 2"'$  (1.575 m)  Wt 99 lb (44.906 kg)  BMI 18.10 kg/m2  SpO2 98%  Patient's Medications  New Prescriptions   No medications on file  Previous Medications   AMIODARONE (PACERONE) 100 MG TABLET    Take 1 tablet (100 mg total) by mouth daily.   CEFTRIAXONE 2 G IN DEXTROSE 5 % 50 ML    Inject 2 g into the vein daily. Tentative stop date 06/28/2015, patient must follow with ID clinic within 1-2 weeks. Final stop date per ID. Please draw CBC and CMP once a week and fax the results to the ID office of Dr. Megan Salon.    DIGOXIN 62.5 MCG TABS    Take 0.0625 mg by mouth daily.   FEEDING SUPPLEMENT, ENSURE ENLIVE, (ENSURE ENLIVE) LIQD    Take 237 mLs by mouth 2 (two) times daily between meals.   MAGNESIUM OXIDE 200 MG TABS    Take 1 tablet (200 mg total) by mouth 2 (two) times daily.   MILRINONE (PRIMACOR) 20 MG/100ML SOLN INFUSION    Inject 11.6 mcg/min into the vein continuous.   OXYCODONE-ACETAMINOPHEN (PERCOCET/ROXICET) 5-325 MG TABLET    Take 1 tablet by mouth every 4 (four) hours as needed for moderate pain.   POTASSIUM CHLORIDE SA (K-DUR,KLOR-CON) 20 MEQ TABLET    Take 2 tablets (40 mEq total) by mouth daily.   SPIRONOLACTONE (ALDACTONE) 25 MG TABLET    Take 0.5 tablets (12.5 mg total) by mouth daily.   TORSEMIDE (DEMADEX) 20 MG TABLET    Take 1 tablet (20 mg total) by mouth daily.   WARFARIN (COUMADIN) 2 MG TABLET    Take 2 mg by mouth daily.  Modified Medications   No medications on file  Discontinued Medications   No medications on file     SIGNIFICANT DIAGNOSTIC EXAMS  06-04-15: chest x-ray: 1. Moderate cardiomegaly and mild pulmonary edema, in keeping with mild congestive heart failure. 2. Small bilateral pleural effusions and mild bibasilar atelectasis. 3. New subcentimeter nodular density in the peripheral left midlung, cannot exclude a pulmonary nodule. Recommend correlation with chest CT on a short term outpatient basis.  06-05-15: ct of abdomen and pelvis: 1. No acute finding. 2. Remote left ventricular infarct with chronic cardiomegaly. Chronic large pericardial effusion and small right pleural effusion. 3. Passive congestion of the liver. 4. Cholelithiasis and nephrolithiasis. 5. Colonic diverticulosis.  06-08-15: mri of thoracic spine: Concern for infectious discitis osteomyelitis at the T11-12 level.Edema within the disc and within the T11 and T12 vertebral bodies. Surrounding edematous change in the paraspinous soft tissues. No evidence of epidural collection or neural compression.   Sterile degenerative spondylosis can have this appearance, particularly in dialysis patients, but there is considerable concern for infection in this case.  06-08-15: mri of  lumbar spine: Degenerative disc disease and degenerative facet disease in the lower lumbar spine but without evidence of severe stenosis or likely neural compression. The facet arthropathy could be associated with low back pain.   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  Degenerative spondylosis from C3-4 through C7-T1. Canal stenosis at C3-4 and C4-5 with AP diameter of canal only 7 mm at those levels. Pronounced facet arthropathy on the left at C3-4 with edema. This could be a cause of left-sided neck pain. Foraminal stenosis on  the left at C3-4 could cause neural compression. Foraminal stenosis bilaterally at C4-5 could cause neural compression.  Spondylosis at C5-6 and C6-7 with canal narrowing to a diameter of 9 mm. Foraminal stenosis bilaterally at those levels that could compress either or both C6 or C7 nerve roots. Foraminal narrowing on the left at C7-T1 that could be symptomatic.  06-09-15: ct of chest: 1. Large pericardial effusion. 2. Small nodule within the left major fissure, with diameter of 7 mm. This is favored be benign, possibly related to pleural effusion, a pseudo tumor. Follow-up CT of the chest is recommended in 6 months to document resolution. 3. Small bilateral pleural effusions, right greater than left. 4. Right greater than left lower lobe consolidation.    LABS REVIEWED:   06-04-15; wbc 24.9; hgb 11.4; hct 35.5 ;mcv 84.1; plt 235; glucose 129; bun 19; creat 1.07; k+ 4.6; na++142; alk phos 146; total bili 2.7; albumin 3.2; blood culture: staphylococcus aureus/klebsiella species 06-05-15: BNP 3619.4 06-06-15: wbc 19.9; hgb 10.1; hct 32.0; mcv 84.4 plt 225; glucose 116; bun 19; creat 0.92; k+ 3.8; na++134; digoxin <0.2 06-13-15: wbc 8.2; hgb 10.7; hct 34.4; mcv 84.7 plt  366; glucose 121; bun 16; creat 0.77; k+ 3.7; na++137     Review of Systems  Constitutional: Negative for malaise/fatigue.  Respiratory: Negative for cough and shortness of breath.   Cardiovascular: Negative for chest pain, palpitations and leg swelling.  Gastrointestinal: Negative for heartburn, abdominal pain and constipation.  Musculoskeletal: Positive for back pain. Negative for myalgias and joint pain.  Skin: Negative.   Neurological: Negative for dizziness.  Psychiatric/Behavioral: The patient is not nervous/anxious.     Physical Exam  Constitutional: She is oriented to person, place, and time. No distress.  Frail   Eyes: Conjunctivae are normal.  Neck: Neck supple. No JVD present. No thyromegaly present.  Cardiovascular: Normal rate, regular rhythm and intact distal pulses.   Murmur heard. 1/6  Respiratory: Effort normal. No respiratory distress. She has no wheezes.  Breath sounds diminished   GI: Soft. Bowel sounds are normal. She exhibits no distension. There is no tenderness.  Musculoskeletal: She exhibits edema.  Able to move all extremities  1+ bilateral lower extremity edema   Lymphadenopathy:    She has no cervical adenopathy.  Neurological: She is alert and oriented to person, place, and time.  Skin: Skin is warm and dry. She is not diaphoretic.  Right IJ site without signs of infection present.   Psychiatric: She has a normal mood and affect.       ASSESSMENT/ PLAN:  1. Chronic systolic heart failure; non-ischemic cardiomyopathy; has EF 20%; will continue milrinone drip; demadex 20 mg dial; aldactone 12.5 mg daily will monitor   2. Hypokalemia: will continue k+ 40 meq daily   3. Afib: heart rate under control; will continue digoxin 0.0625 mg daily; amiodarone 100 mg daily is on long term coumadin  therapy   4. Left ventricular thrombus: is stable is on long term coumadin therapy   5. T11-T12 discitis: will complete rocephin and will monitor her  status.   6. Back pain: will change her percocet to oxycodone 5 or 10 mg every 4 hours as needed for pain to reduce her tylenol      Time spent with patient 50   minutes >50% time spent counseling; reviewing medical record; tests; labs; and developing future plan of care   Ok Edwards NP [facility]  Contact [phone] Monday through Friday 8am- 5pm  After  hours call [phone]

## 2015-06-16 ENCOUNTER — Telehealth: Payer: Self-pay | Admitting: *Deleted

## 2015-06-16 ENCOUNTER — Other Ambulatory Visit: Payer: Self-pay

## 2015-06-16 MED ORDER — OXYCODONE HCL 5 MG PO TABS: 10.0000 mg | ORAL_TABLET | ORAL | Status: DC | PRN

## 2015-06-16 NOTE — Telephone Encounter (Signed)
Spoke with spouse, pt is currently at [facility] since [place] discharge. Telephoned [facility] spoke with nurse Marla Roe) and she states they manage pts anticoagulation while she is there and will inform us when she is discharged.

## 2015-06-20 ENCOUNTER — Encounter: Payer: Self-pay | Admitting: Internal Medicine

## 2015-06-20 ENCOUNTER — Non-Acute Institutional Stay (SKILLED_NURSING_FACILITY): Payer: Medicare Other | Admitting: Internal Medicine

## 2015-06-20 DIAGNOSIS — I5041 Acute combined systolic (congestive) and diastolic (congestive) heart failure: Secondary | ICD-10-CM | POA: Diagnosis not present

## 2015-06-20 DIAGNOSIS — I319 Disease of pericardium, unspecified: Secondary | ICD-10-CM | POA: Diagnosis not present

## 2015-06-20 DIAGNOSIS — M4644 Discitis, unspecified, thoracic region: Secondary | ICD-10-CM

## 2015-06-20 DIAGNOSIS — I110 Hypertensive heart disease with heart failure: Secondary | ICD-10-CM

## 2015-06-20 DIAGNOSIS — I213 ST elevation (STEMI) myocardial infarction of unspecified site: Secondary | ICD-10-CM | POA: Diagnosis not present

## 2015-06-20 DIAGNOSIS — I480 Paroxysmal atrial fibrillation: Secondary | ICD-10-CM

## 2015-06-20 DIAGNOSIS — I272 Other secondary pulmonary hypertension: Secondary | ICD-10-CM

## 2015-06-20 DIAGNOSIS — I3139 Other pericardial effusion (noninflammatory): Secondary | ICD-10-CM

## 2015-06-20 DIAGNOSIS — D62 Acute posthemorrhagic anemia: Secondary | ICD-10-CM

## 2015-06-20 DIAGNOSIS — I513 Intracardiac thrombosis, not elsewhere classified: Secondary | ICD-10-CM

## 2015-06-20 DIAGNOSIS — I313 Pericardial effusion (noninflammatory): Secondary | ICD-10-CM

## 2015-06-20 DIAGNOSIS — A414 Sepsis due to anaerobes: Secondary | ICD-10-CM

## 2015-06-20 DIAGNOSIS — A4101 Sepsis due to Methicillin susceptible Staphylococcus aureus: Secondary | ICD-10-CM

## 2015-06-20 DIAGNOSIS — E43 Unspecified severe protein-calorie malnutrition: Secondary | ICD-10-CM

## 2015-06-20 DIAGNOSIS — J189 Pneumonia, unspecified organism: Secondary | ICD-10-CM

## 2015-06-20 DIAGNOSIS — R911 Solitary pulmonary nodule: Secondary | ICD-10-CM

## 2015-06-20 NOTE — Progress Notes (Signed)
Patient ID: Katrina Peck, female   DOB: September 20, 1945, 70 y.o.   MRN: [id number] MRN: [id number] Name: Katrina Peck  Sex: female Age: 70 y.o. DOB: 1946-02-23  PSC #: Katrina Peck Facility/Room: 124 B Level Of Care: SNF Provider: Merrilee Seashore Emergency Contacts: Extended Emergency Contact Information Primary Emergency Contact: [name],[name] Address: [address]          [place], [place] [address] Darden Amber of Peck Home Phone: [phone] Mobile Phone: [phone] Relation: Spouse Secondary Emergency Contact: Pleasant,Raneta Address: [address]          [place], [place] [address] Katrina Peck Home Phone: [phone] Mobile Phone: [phone] Relation: Daughter  Code Status:   Allergies: Review of patient's allergies indicates no known allergies.  Chief Complaint  Patient presents with  . New Admit To SNF    HPI: Patient is 70 y.o. female with chronic systolic heart failure-on milrinone infusion at home via a tunneled catheter, presented to the hospital with severe lower back pain. Found to have significant leukocytosis. 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 Seen by infectious disease, recommendations for  IV Rocephin until April 24. Hospital course was further complicated by acute on chronic CHF. Pt was admitted to [facility] on 3/20 and transferred to [facility]F on 3/22 for supportive care for generalized weakness and for long course of IV antibiotics.While at SNF pt will be followed for chronic LV thrombus, tx with coumadin, Htn, tx with demadex and aldactone and PAF, tx with amiodarone, digoxin and coumadin.  Past Medical History  Diagnosis Date  . Anemia   . Hypertension   . Blood transfusion without reported diagnosis   . Chronic systolic CHF (congestive heart failure) (HCC)     a.  06/2014 Echo: EF 20%.  Marland Kitchen NICM (nonischemic cardiomyopathy) (HCC)     a. 06/2014 Echo: EF 20%, diff HK, mild MR, mildly dil LA/RA, mildly reduced RV fxn;  b. 06/2014 Cath: nl cors.  . Pericardial effusion     a. 06/2014 Large effusion, no tamponade phys.    Past Surgical History  Procedure Laterality Date  . No past surgeries    . Left and right heart catheterization with coronary angiogram N/A 07/23/2014    Procedure: LEFT AND RIGHT HEART CATHETERIZATION WITH CORONARY ANGIOGRAM;  Surgeon: Laurey Morale, MD;  Location: [facility] CATH LAB;  Service: Cardiovascular;  Laterality: N/A;  . Subxyphoid pericardial window N/A 07/26/2014    Procedure: SUBXYPHOID PERICARDIAL WINDOW;  Surgeon: Delight Ovens, MD;  Location: [facility] OR;  Service: Thoracic;  Laterality: N/A;  . Tee without cardioversion N/A 07/26/2014    Procedure: TRANSESOPHAGEAL ECHOCARDIOGRAM (TEE);  Surgeon: Delight Ovens, MD;  Location: [facility] OR;  Service: Thoracic;  Laterality: N/A;  . Pleural effusion drainage N/A 07/26/2014    Procedure: DRAINAGE OF PERICARDIAL EFFUSION;  Surgeon: Delight Ovens, MD;  Location: [facility] OR;  Service: Thoracic;  Laterality: N/A;  . Cardiac catheterization N/A 02/07/2015    Procedure: Right Heart Cath;  Surgeon: Laurey Morale, MD;  Location: [facility] INVASIVE CV LAB;  Service: Cardiovascular;  Laterality: N/A;  . Tee without cardioversion N/A 06/06/2015    Procedure: TRANSESOPHAGEAL ECHOCARDIOGRAM (TEE);  Surgeon: Dolores Patty, MD;  Location: [facility] ENDOSCOPY;  Service: Cardiovascular;  Laterality: N/A;  . Radiology with anesthesia N/A 06/08/2015    Procedure: MRI CERVICAL SPINE WITH/WITHOUT LUMBAR WITH/WITHOUT THORACIC WITH/WITHOUT       (  RADIOLOGY WITH ANESTHESIA);  Surgeon: Medication Radiologist, MD;  Location: [facility] OR;  Service: Radiology;  Laterality: N/A;      Medication List       This list is accurate as of: 06/20/15 11:59 PM.  Always use your most recent med list.               amiodarone 100 MG tablet   Commonly known as:  PACERONE  Take 1 tablet (100 mg total) by mouth daily.     azithromycin 500 MG tablet  Commonly known as:  ZITHROMAX  Take 500 mg by mouth daily. Unitl 06/25/15     cefTRIAXone 2 g in dextrose 5 % 50 mL  Inject 2 g into the vein daily. Tentative stop date 06/28/2015, patient must follow with ID clinic within 1-2 weeks. Final stop date per ID. Please draw CBC and CMP once a week and fax the results to the ID office of Dr. Orvan Falconer.     Digoxin 62.5 MCG Tabs  Take 0.0625 mg by mouth daily.     feeding supplement (ENSURE ENLIVE) Liqd  Take 237 mLs by mouth 2 (two) times daily between meals.     Magnesium Oxide 200 MG Tabs  Take 1 tablet (200 mg total) by mouth 2 (two) times daily.     milrinone 20 MG/100ML Soln infusion  Commonly known as:  PRIMACOR  Inject 11.6 mcg/min into the vein continuous.     oxyCODONE 5 MG immediate release tablet  Commonly known as:  Oxy IR/ROXICODONE  Take 2 tablets (10 mg total) by mouth every 4 (four) hours as needed for moderate pain or severe pain.     potassium chloride SA 20 MEQ tablet  Commonly known as:  K-DUR,KLOR-CON  Take 20 mEq by mouth daily.     spironolactone 25 MG tablet  Commonly known as:  ALDACTONE  Take 0.5 tablets (12.5 mg total) by mouth daily.     torsemide 20 MG tablet  Commonly known as:  DEMADEX  Take 1 tablet (20 mg total) by mouth daily.     warfarin 2 MG tablet  Commonly known as:  COUMADIN  Take 2 mg by mouth daily.        Meds ordered this encounter  Medications  . potassium chloride SA (K-DUR,KLOR-CON) 20 MEQ tablet    Sig: Take 20 mEq by mouth daily.     There is no immunization history on file for this patient.  Social History  Substance Use Topics  . Smoking status: Never Smoker   . Smokeless tobacco: Never Used  . Alcohol Use: No    Family history is + HTN, DM   Review of Systems  DATA OBTAINED: from patient GENERAL:  no fevers, fatigue, appetite changes SKIN: No  itching, rash or wounds EYES: No eye pain, redness, discharge EARS: No earache, tinnitus, change in hearing NOSE: No congestion, drainage or bleeding  MOUTH/THROAT: No mouth or tooth pain, No sore throat RESPIRATORY: No cough, wheezing, SOB CARDIAC: No chest pain, palpitations, lower extremity edema  GI: No abdominal pain, No N/V/D or constipation, No heartburn or reflux  GU: No dysuria, frequency or urgency, or incontinence  MUSCULOSKELETAL: No unrelieved bone/joint pain; admits to muscle spasms NEUROLOGIC: No headache, dizziness or focal weakness after i ask her PSYCHIATRIC: No c/o anxiety or sadness   Filed Vitals:   06/20/15 1252  BP: 116/70  Pulse: 64  Temp: 98.3 F (36.8 C)  Resp: 18    SpO2 Readings from Last  1 Encounters:  06/20/15 98%        Physical Exam  GENERAL APPEARANCE: Alert, conversant,  No acute distress, lovely BF SKIN: No diaphoresis rash HEAD: Normocephalic, atraumatic  EYES: Conjunctiva/lids clear. Pupils round, reactive. EOMs intact.  EARS: External exam WNL, canals clear. Hearing grossly normal.  NOSE: No deformity or discharge.  MOUTH/THROAT: Lips w/o lesions  RESPIRATORY: Breathing is even, unlabored. Lung sounds are clear   CARDIOVASCULAR: Heart RRR no murmurs, rubs or gallops. No peripheral edema.   GASTROINTESTINAL: Abdomen is soft, non-tender, not distended w/ normal bowel sounds. GENITOURINARY: Bladder non tender, not distended  MUSCULOSKELETAL: No abnormal joints or musculature; appears uncomfortable in neck and back NEUROLOGIC:  Cranial nerves 2-12 grossly intact. Moves all extremities  PSYCHIATRIC: Mood and affect appropriate to situation, no behavioral issues  Patient Active Problem List   Diagnosis Date Noted  . Klebsiella pneumoniae sepsis (HCC) 06/25/2015  . Pulmonary hypertension (HCC) 06/25/2015  . HCAP (healthcare-associated pneumonia) 06/25/2015  . Lung nodule   . Discitis of thoracic region   . Malnutrition of moderate  degree 06/07/2015  . Acute combined systolic and diastolic congestive heart failure (HCC)   . Leukocytosis   . Acute blood loss anemia   . Bacteremia   . MSSA (methicillin susceptible Staphylococcus aureus) septicemia (HCC) 06/05/2015  . Back spasm 06/05/2015  . Back pain   . Neck pain   . Acute on chronic systolic CHF (congestive heart failure), NYHA class 4 (HCC)   . Central line infection   . Atrial fibrillation (HCC) [I48.91] 03/31/2015  . Long term (current) use of anticoagulants [Z79.01] 03/31/2015  . Encounter for therapeutic drug monitoring 03/31/2015  . Non compliance w medication regimen 03/28/2015  . Protein-calorie malnutrition, severe (HCC) 03/28/2015  . Acute on chronic systolic congestive heart failure (HCC)   . LV (left ventricular) mural thrombus (HCC)   . Acute systolic CHF (congestive heart failure) (HCC) 02/04/2015  . Acute on chronic systolic (congestive) heart failure (HCC) 02/04/2015  . Elevated troponin   . Acute on chronic systolic and diastolic heart failure, NYHA class 4 (HCC) 11/11/2014  . Paroxysmal atrial fibrillation (HCC) 09/29/2014  . Chronic systolic heart failure (HCC) 08/07/2014  . Acute systolic CHF (congestive heart failure), NYHA class 4 (HCC) 07/24/2014  . NICM (nonischemic cardiomyopathy) (HCC) 07/24/2014  . 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 Pericardial effusion 07/24/2014  . Acute CHF (HCC) 07/20/2014    CBC    Component Value Date/Time   WBC 8.2 06/13/2015 0509   WBC 8.2 06/13/2015   WBC 7.6 12/24/2013 1707   RBC 4.06 06/13/2015 0509   RBC 4.77 12/24/2013 1707   HGB 10.7* 06/13/2015 0509   HGB 13.0 12/24/2013 1707   HCT 34.4* 06/13/2015 0509   HCT 42.1 12/24/2013 1707   PLT 366 06/13/2015 0509   MCV 84.7 06/13/2015 0509   MCV 88.2 12/24/2013 1707   LYMPHSABS 1.2 06/13/2015 0509   MONOABS 0.6 06/13/2015 0509   EOSABS 0.1 06/13/2015 0509   BASOSABS 0.0 06/13/2015 0509     CMP     Component Value Date/Time   NA 137 06/13/2015 0509   K 3.7 06/13/2015 0509   CL 95* 06/13/2015 0509   CO2 30 06/13/2015 0509   GLUCOSE 121* 06/13/2015 0509   BUN 16 06/13/2015 0509   CREATININE 0.77 06/13/2015 0509   CREATININE 0.64 12/24/2013 1658   CALCIUM 9.4 06/13/2015 0509   PROT 6.7 06/05/2015 0330   ALBUMIN 2.9*  06/05/2015 0330   AST 21 06/05/2015 0330   ALT 25 06/05/2015 0330   ALKPHOS 130* 06/05/2015 0330   BILITOT 2.4* 06/05/2015 0330   GFRNONAA >60 06/13/2015 0509   GFRAA >60 06/13/2015 0509    No results found for: HGBA1C   Ct Abdomen Pelvis W Contrast  06/05/2015  CLINICAL DATA:  Back spasms.  Leukocytosis. EXAM: CT ABDOMEN AND PELVIS WITH CONTRAST TECHNIQUE: Multidetector CT imaging of the abdomen and pelvis was performed using the standard protocol following bolus administration of intravenous contrast. CONTRAST:  75mL OMNIPAQUE IOHEXOL 300 MG/ML  SOLN COMPARISON:  None. FINDINGS: Lower chest and abdominal wall: Chronic cardiomegaly. Remote subendocardial left ventricular infarct, greatest along the anterior wall. Chronic large pericardial effusion when compared to previous CT. Chronic small right pleural effusion with atelectasis or scar. Hepatobiliary: Liver enhancement pattern suggests passive congestion. No focal lesion.Cholelithiasis without evidence of acute cholecystitis. No duct enlargement Pancreas: Unremarkable. Spleen: Unremarkable. Adrenals/Urinary Tract: Negative adrenals. Bilateral renal cortical thinning. Small nonobstructive bilateral renal calculi. Renal sinus cysts. Chronic hemorrhagic or proteinaceous cyst in the upper pole left kidney measuring 13 mm, also seen on April 2016 chest CT. Unremarkable decompressed bladder. Reproductive:Multiple uterine fibroids measuring up to 23 mm. Stomach/Bowel: No obstruction. Colonic diverticulosis without active inflammation. No evidence of appendicitis. Vascular/Lymphatic: No acute vascular abnormality. No  mass or adenopathy. Peritoneal: Small ascites Musculoskeletal: No acute abnormalities. IMPRESSION: 1. No acute finding. 2. Remote left ventricular infarct with chronic cardiomegaly. Chronic large pericardial effusion and small right pleural effusion. 3. Passive congestion of the liver. 4. Cholelithiasis and nephrolithiasis. 5. Colonic diverticulosis. Electronically Signed   By: Marnee Spring M.D.   On: 06/05/2015 01:33   Dg Chest Port 1 View  06/04/2015  CLINICAL DATA:  Shortness of breath EXAM: PORTABLE CHEST 1 VIEW COMPARISON:  03/22/2015 chest radiograph. FINDINGS: Right internal jugular central venous catheter terminates at the cavoatrial junction. Stable cardiomediastinal silhouette with moderate cardiomegaly. No pneumothorax. Small bilateral pleural effusions. Mild pulmonary edema. Mild bibasilar atelectasis. There is a new subcentimeter nodular density in the peripheral left mid lung. IMPRESSION: 1. Moderate cardiomegaly and mild pulmonary edema, in keeping with mild congestive heart failure. 2. Small bilateral pleural effusions and mild bibasilar atelectasis. 3. New subcentimeter nodular density in the peripheral left mid lung, cannot exclude a pulmonary nodule. Recommend correlation with chest CT on a short term outpatient basis. Electronically Signed   By: Delbert Phenix M.D.   On: 06/04/2015 20:44    Not all labs, radiology exams or other studies done during hospitalization come through on my EPIC note; however they are reviewed by me.    Assessment and Plan  MSSA (methicillin susceptible Staphylococcus aureus) septicemia (HCC)  Secondary to catheter related blood stream infection. Tunneled catheter removed by IR on 3/13. ID now recommending SNF - IV Rocephin until April 24 Tentatively, final stop date per ID. Needs weekly CBC and CMP faxed to infectious disease clinic from SNF/CIR. She must follow with ID within 1-2 weeks for the final stop date. SNF - iv rocephin intil 4/24 thru PICC;  appropriate labs to ID  Klebsiella pneumoniae sepsis (HCC) :Secondary to catheter related blood stream infection. Tunneled catheter removed by IR on 3/13.  TEE 3/13 without any vegetations. Repeat blood cultures negative, subsequently midline catheter reinserted. ID now recommending IV Rocephin until April 24 Tentatively, final stop date per ID. Needs weekly CBC and CMP faxed to infectious disease clinic from SNF/CIR. She must follow with ID within 1-2 weeks for the final  stop date. SNF - IV rocephin until 4/24 with labs to ID  Discitis of thoracic region T11/T12 discitis: MRI entire spine with gen anesthesia/sedation showed T11/T12 discitis. TEE 3/13 without any vegetations. Repeat blood cultures negative, subsequently midline catheter reinserted. ID now recommending IV Rocephin until April 24 Tentatively, final stop date per ID. Needs weekly CBC and CMP faxed to infectious disease clinic from SNF/CIR. She must follow with ID within 1-2 weeks for the final stop date. SNF - IV rocephin until 4/24; weekly labs to ID; pt having significant muscle spasms; have added robaxin BID after discussion with pt  Acute combined systolic and diastolic congestive heart failure (HCC) (EF 20% by TTE on December 2016): Hx of Nonischemic cardiomyopathy.LHC(4/16) with no significant coronary disease; Cards has OK fro d/c SNF - cont milrinone drip per pump, demadex, spironolactone  Paroxysmal atrial fibrillation (HCC) SNF - Rate controlled with amiodarone, digoxinon ;chronic Coumadin. CHADS2VASC 5. Which will be actively titrated  LV (left ventricular) mural thrombus (HCC) SNF - chronic , on coumadin for same; will be actively titrated  Pericardial effusion s/p window 07/26/14-cytology was negative. CT abdomen done on admission shows a large pericardial effusion-however TEE 3/13 showed moderate pericardial effusion-no tamponade SNF - will monitor VS  Pulmonary hypertension (HCC) likely secondary to LV  failure SNF - cont demadex and spironolactone  Hypertensive heart disease with CHF (congestive heart failure) (HCC) SNF - controlled; cont with demeadex and aldactone  HCAP (healthcare-associated pneumonia) SNF - best as I can tell pt was dx with PNA at [facility] and started on zithromax on 3/21 to run for 10 days; will cont zithromax;   Lung nodule CT chest confirmed a small nodule in the left major fissure, recommendations from radiology are to repeat a CT chest in 6 months.   Acute blood loss anemia SNF - d/c HB was 10.7; will f/u CBC  Protein-calorie malnutrition, severe (HCC) SNF - dietary to eval; there will be protein supplements   Time spent > 45 min;> 50% of time with patient was spent reviewing records, labs, tests and studies, counseling and developing plan of care  Merrilee Seashore, MD

## 2015-06-20 NOTE — Progress Notes (Deleted)
MRN: [id number] Name: Katrina Peck  Sex: female Age: 70 y.o. DOB: May 08, 1945  PSC #:  Facility/Room: Level Of Care: SNF Provider: Merrilee Seashore D Emergency Contacts: Extended Emergency Contact Information Primary Emergency Contact: Scaduto,[name] Address: [address]          [place], [place] [address] [place] Home Phone: [phone] Mobile Phone: [phone] Relation: Spouse Secondary Emergency Contact: Pleasant,Raneta Address: [address]          [place], [place] [address] [name] Home Phone: [phone] Mobile Phone: [phone] Relation: Daughter  Code Status:   Allergies: Review of patient's allergies indicates no known allergies.  Chief Complaint  Patient presents with  . New Admit To SNF    HPI: Patient is 70 y.o. female who  Past Medical History  Diagnosis Date  . Anemia   . Hypertension   . Blood transfusion without reported diagnosis   . Chronic systolic CHF (congestive heart failure) (HCC)     a. 06/2014 Echo: EF 20%.  Marland Kitchen NICM (nonischemic cardiomyopathy) (HCC)     a. 06/2014 Echo: EF 20%, diff HK, mild MR, mildly dil LA/RA, mildly reduced RV fxn;  b. 06/2014 Cath: nl cors.  . Pericardial effusion     a. 06/2014 Large effusion, no tamponade phys.    Past Surgical History  Procedure Laterality Date  . No past surgeries    . Left and right heart catheterization with coronary angiogram N/A 07/23/2014    Procedure: LEFT AND RIGHT HEART CATHETERIZATION WITH CORONARY ANGIOGRAM;  Surgeon: Laurey Morale, MD;  Location: [facility] CATH LAB;  Service: Cardiovascular;  Laterality: N/A;  . Subxyphoid pericardial window N/A 07/26/2014    Procedure: SUBXYPHOID PERICARDIAL WINDOW;  Surgeon: Delight Ovens, MD;  Location: [facility] OR;  Service: Thoracic;  Laterality: N/A;  . Tee without cardioversion N/A 07/26/2014    Procedure: TRANSESOPHAGEAL ECHOCARDIOGRAM (TEE);  Surgeon: Delight Ovens, MD;  Location: [facility] OR;  Service: Thoracic;   Laterality: N/A;  . Pleural effusion drainage N/A 07/26/2014    Procedure: DRAINAGE OF PERICARDIAL EFFUSION;  Surgeon: Delight Ovens, MD;  Location: [facility] OR;  Service: Thoracic;  Laterality: N/A;  . Cardiac catheterization N/A 02/07/2015    Procedure: Right Heart Cath;  Surgeon: Laurey Morale, MD;  Location: [facility] INVASIVE CV LAB;  Service: Cardiovascular;  Laterality: N/A;  . Tee without cardioversion N/A 06/06/2015    Procedure: TRANSESOPHAGEAL ECHOCARDIOGRAM (TEE);  Surgeon: Dolores Patty, MD;  Location: [facility] ENDOSCOPY;  Service: Cardiovascular;  Laterality: N/A;  . Radiology with anesthesia N/A 06/08/2015    Procedure: MRI CERVICAL SPINE WITH/WITHOUT LUMBAR WITH/WITHOUT THORACIC WITH/WITHOUT       (RADIOLOGY WITH ANESTHESIA);  Surgeon: Medication Radiologist, MD;  Location: [facility] OR;  Service: Radiology;  Laterality: N/A;      Medication List       This list is accurate as of: 06/20/15 12:26 PM.  Always use your most recent med list.               amiodarone 100 MG tablet  Commonly known as:  PACERONE  Take 1 tablet (100 mg total) by mouth daily.     azithromycin 500 MG tablet  Commonly known as:  ZITHROMAX  Take 500 mg by mouth daily. Unitl 06/25/15     cefTRIAXone 2 g in dextrose 5 % 50 mL  Inject 2 g into the vein daily. Tentative stop date 06/28/2015, patient must follow with ID clinic within 1-2 weeks. Final stop  date per ID. Please draw CBC and CMP once a week and fax the results to the ID office of Dr. Orvan Falconer.     Digoxin 62.5 MCG Tabs  Take 0.0625 mg by mouth daily.     feeding supplement (ENSURE ENLIVE) Liqd  Take 237 mLs by mouth 2 (two) times daily between meals.     Magnesium Oxide 200 MG Tabs  Take 1 tablet (200 mg total) by mouth 2 (two) times daily.     milrinone 20 MG/100ML Soln infusion  Commonly known as:  PRIMACOR  Inject 11.6 mcg/min into the vein continuous.     oxyCODONE 5 MG immediate release tablet  Commonly known as:  Oxy IR/ROXICODONE  Take 2  tablets (10 mg total) by mouth every 4 (four) hours as needed for moderate pain or severe pain.     oxyCODONE-acetaminophen 7.5-325 MG tablet  Commonly known as:  PERCOCET  Take 1 tablet by mouth every 4 (four) hours as needed for severe pain.     oxyCODONE-acetaminophen 5-325 MG tablet  Commonly known as:  PERCOCET/ROXICET  Take 1 tablet by mouth every 4 (four) hours as needed for moderate pain.     potassium chloride SA 20 MEQ tablet  Commonly known as:  K-DUR,KLOR-CON  Take 2 tablets (40 mEq total) by mouth daily.     spironolactone 25 MG tablet  Commonly known as:  ALDACTONE  Take 0.5 tablets (12.5 mg total) by mouth daily.     torsemide 20 MG tablet  Commonly known as:  DEMADEX  Take 1 tablet (20 mg total) by mouth daily.     warfarin 2 MG tablet  Commonly known as:  COUMADIN  Take 2 mg by mouth daily.        No orders of the defined types were placed in this encounter.     There is no immunization history on file for this patient.  Social History  Substance Use Topics  . Smoking status: Never Smoker   . Smokeless tobacco: Never Used  . Alcohol Use: No    Family history is    Review of Systems  DATA OBTAINED: from patient, nurse, medical record, family member GENERAL:  no fevers, fatigue, appetite changes SKIN: No itching, rash or wounds EYES: No eye pain, redness, discharge EARS: No earache, tinnitus, change in hearing NOSE: No congestion, drainage or bleeding  MOUTH/THROAT: No mouth or tooth pain, No sore throat RESPIRATORY: No cough, wheezing, SOB CARDIAC: No chest pain, palpitations, lower extremity edema  GI: No abdominal pain, No N/V/D or constipation, No heartburn or reflux  GU: No dysuria, frequency or urgency, or incontinence  MUSCULOSKELETAL: No unrelieved bone/joint pain NEUROLOGIC: No headache, dizziness or focal weakness PSYCHIATRIC: No c/o anxiety or sadness   There were no vitals filed for this visit.  SpO2 Readings from Last 1  Encounters:  06/15/15 98%        Physical Exam  GENERAL APPEARANCE: Alert, conversant,  No acute distress.  SKIN: No diaphoresis rash HEAD: Normocephalic, atraumatic  EYES: Conjunctiva/lids clear. Pupils round, reactive. EOMs intact.  EARS: External exam WNL, canals clear. Hearing grossly normal.  NOSE: No deformity or discharge.  MOUTH/THROAT: Lips w/o lesions  RESPIRATORY: Breathing is even, unlabored. Lung sounds are clear   CARDIOVASCULAR: Heart RRR no murmurs, rubs or gallops. No peripheral edema.   GASTROINTESTINAL: Abdomen is soft, non-tender, not distended w/ normal bowel sounds. GENITOURINARY: Bladder non tender, not distended  MUSCULOSKELETAL: No abnormal joints or musculature NEUROLOGIC:  Cranial nerves  2-12 grossly intact. Moves all extremities  PSYCHIATRIC: Mood and affect appropriate to situation, no behavioral issues  Patient Active Problem List   Diagnosis Date Noted  . Lung nodule   . Discitis of thoracic region   . Malnutrition of moderate degree 06/07/2015  . Acute combined systolic and diastolic congestive heart failure (HCC)   . Leukocytosis   . Acute blood loss anemia   . Bacteremia   . Sepsis (HCC) 06/05/2015  . Back spasm 06/05/2015  . Back pain   . Neck pain   . Acute on chronic systolic CHF (congestive heart failure), NYHA class 4 (HCC)   . Central line infection   . Atrial fibrillation (HCC) [I48.91] 03/31/2015  . Long term (current) use of anticoagulants [Z79.01] 03/31/2015  . Encounter for therapeutic drug monitoring 03/31/2015  . Non compliance w medication regimen 03/28/2015  . Protein-calorie malnutrition, severe (HCC) 03/28/2015  . Acute on chronic systolic congestive heart failure (HCC)   . LV (left ventricular) mural thrombus (HCC)   . Acute systolic CHF (congestive heart failure) (HCC) 02/04/2015  . Acute on chronic systolic (congestive) heart failure (HCC) 02/04/2015  . Elevated troponin   . Acute on chronic systolic and diastolic  heart failure, NYHA class 4 (HCC) 11/11/2014  . Paroxysmal atrial fibrillation (HCC) 09/29/2014  . Chronic systolic heart failure (HCC) 08/07/2014  . Acute systolic CHF (congestive heart failure), NYHA class 4 (HCC) 07/24/2014  . NICM (nonischemic cardiomyopathy) (HCC) 07/24/2014  . Essential hypertension 07/24/2014  . Pericardial effusion 07/24/2014  . Acute CHF (HCC) 07/20/2014    CBC    Component Value Date/Time   WBC 8.2 06/13/2015 0509   WBC 8.2 06/13/2015   WBC 7.6 12/24/2013 1707   RBC 4.06 06/13/2015 0509   RBC 4.77 12/24/2013 1707   HGB 10.7* 06/13/2015 0509   HGB 13.0 12/24/2013 1707   HCT 34.4* 06/13/2015 0509   HCT 42.1 12/24/2013 1707   PLT 366 06/13/2015 0509   MCV 84.7 06/13/2015 0509   MCV 88.2 12/24/2013 1707   LYMPHSABS 1.2 06/13/2015 0509   MONOABS 0.6 06/13/2015 0509   EOSABS 0.1 06/13/2015 0509   BASOSABS 0.0 06/13/2015 0509    CMP     Component Value Date/Time   NA 137 06/13/2015 0509   K 3.7 06/13/2015 0509   CL 95* 06/13/2015 0509   CO2 30 06/13/2015 0509   GLUCOSE 121* 06/13/2015 0509   BUN 16 06/13/2015 0509   CREATININE 0.77 06/13/2015 0509   CREATININE 0.64 12/24/2013 1658   CALCIUM 9.4 06/13/2015 0509   PROT 6.7 06/05/2015 0330   ALBUMIN 2.9* 06/05/2015 0330   AST 21 06/05/2015 0330   ALT 25 06/05/2015 0330   ALKPHOS 130* 06/05/2015 0330   BILITOT 2.4* 06/05/2015 0330   GFRNONAA >60 06/13/2015 0509   GFRAA >60 06/13/2015 0509    No results found for: HGBA1C   Ct Abdomen Pelvis W Contrast  06/05/2015  CLINICAL DATA:  Back spasms.  Leukocytosis. EXAM: CT ABDOMEN AND PELVIS WITH CONTRAST TECHNIQUE: Multidetector CT imaging of the abdomen and pelvis was performed using the standard protocol following bolus administration of intravenous contrast. CONTRAST:  75mL OMNIPAQUE IOHEXOL 300 MG/ML  SOLN COMPARISON:  None. FINDINGS: Lower chest and abdominal wall: Chronic cardiomegaly. Remote subendocardial left ventricular infarct, greatest  along the anterior wall. Chronic large pericardial effusion when compared to previous CT. Chronic small right pleural effusion with atelectasis or scar. Hepatobiliary: Liver enhancement pattern suggests passive congestion. No focal lesion.Cholelithiasis without evidence of  acute cholecystitis. No duct enlargement Pancreas: Unremarkable. Spleen: Unremarkable. Adrenals/Urinary Tract: Negative adrenals. Bilateral renal cortical thinning. Small nonobstructive bilateral renal calculi. Renal sinus cysts. Chronic hemorrhagic or proteinaceous cyst in the upper pole left kidney measuring 13 mm, also seen on April 2016 chest CT. Unremarkable decompressed bladder. Reproductive:Multiple uterine fibroids measuring up to 23 mm. Stomach/Bowel: No obstruction. Colonic diverticulosis without active inflammation. No evidence of appendicitis. Vascular/Lymphatic: No acute vascular abnormality. No mass or adenopathy. Peritoneal: Small ascites Musculoskeletal: No acute abnormalities. IMPRESSION: 1. No acute finding. 2. Remote left ventricular infarct with chronic cardiomegaly. Chronic large pericardial effusion and small right pleural effusion. 3. Passive congestion of the liver. 4. Cholelithiasis and nephrolithiasis. 5. Colonic diverticulosis. Electronically Signed   By: Marnee Spring M.D.   On: 06/05/2015 01:33   Dg Chest Port 1 View  06/04/2015  CLINICAL DATA:  Shortness of breath EXAM: PORTABLE CHEST 1 VIEW COMPARISON:  03/22/2015 chest radiograph. FINDINGS: Right internal jugular central venous catheter terminates at the cavoatrial junction. Stable cardiomediastinal silhouette with moderate cardiomegaly. No pneumothorax. Small bilateral pleural effusions. Mild pulmonary edema. Mild bibasilar atelectasis. There is a new subcentimeter nodular density in the peripheral left mid lung. IMPRESSION: 1. Moderate cardiomegaly and mild pulmonary edema, in keeping with mild congestive heart failure. 2. Small bilateral pleural effusions  and mild bibasilar atelectasis. 3. New subcentimeter nodular density in the peripheral left mid lung, cannot exclude a pulmonary nodule. Recommend correlation with chest CT on a short term outpatient basis. Electronically Signed   By: Delbert Phenix M.D.   On: 06/04/2015 20:44    Not all labs, radiology exams or other studies done during hospitalization come through on my EPIC note; however they are reviewed by me.    Assessment and Plan  No problem-specific assessment & plan notes found for this encounter.   Margit Hanks, MD

## 2015-06-22 ENCOUNTER — Other Ambulatory Visit: Payer: Self-pay

## 2015-06-22 ENCOUNTER — Encounter (HOSPITAL_COMMUNITY): Payer: Medicare Other

## 2015-06-22 NOTE — Telephone Encounter (Signed)
Opened in error, hard copy was requested for Oxycodone. Hard copy must be dated 06/18/15 with a dispense number of [id number]. RX order was filled out on fax and place in Monina's office mailbox for signature

## 2015-06-25 ENCOUNTER — Encounter: Payer: Self-pay | Admitting: Internal Medicine

## 2015-06-25 DIAGNOSIS — J189 Pneumonia, unspecified organism: Secondary | ICD-10-CM | POA: Insufficient documentation

## 2015-06-25 DIAGNOSIS — A414 Sepsis due to anaerobes: Secondary | ICD-10-CM | POA: Insufficient documentation

## 2015-06-25 DIAGNOSIS — I272 Pulmonary hypertension, unspecified: Secondary | ICD-10-CM | POA: Insufficient documentation

## 2015-06-25 NOTE — Assessment & Plan Note (Signed)
SNF - best as I can tell pt was dx with PNA at [facility] and started on zithromax on 3/21 to run for 10 days; will cont zithromax;

## 2015-06-25 NOTE — Assessment & Plan Note (Signed)
CT chest confirmed a small nodule in the left major fissure, recommendations from radiology are to repeat a CT chest in 6 months.

## 2015-06-25 NOTE — Assessment & Plan Note (Signed)
SNF - controlled; cont with demeadex and aldactone

## 2015-06-25 NOTE — Assessment & Plan Note (Signed)
SNF - Rate controlled with amiodarone, digoxinon ;chronic Coumadin. CHADS2VASC 5. Which will be actively titrated

## 2015-06-25 NOTE — Assessment & Plan Note (Signed)
SNF - d/c HB was 10.7; will f/u CBC

## 2015-06-25 NOTE — Assessment & Plan Note (Signed)
SNF - dietary to eval; there will be protein supplements

## 2015-06-25 NOTE — Assessment & Plan Note (Signed)
likely secondary to LV failure SNF - cont demadex and spironolactone

## 2015-06-25 NOTE — Assessment & Plan Note (Signed)
SNF - chronic , on coumadin for same; will be actively titrated

## 2015-06-25 NOTE — Assessment & Plan Note (Addendum)
T11/T12 discitis: MRI entire spine with gen anesthesia/sedation showed T11/T12 discitis. TEE 3/13 without any vegetations. Repeat blood cultures negative, subsequently midline catheter reinserted. ID now recommending IV Rocephin until April 24 Tentatively, final stop date per ID. Needs weekly CBC and CMP faxed to infectious disease clinic from SNF/CIR. She must follow with ID within 1-2 weeks for the final stop date. SNF - IV rocephin until 4/24; weekly labs to ID; pt having significant muscle spasms; have added robaxin BID after discussion with pt

## 2015-06-25 NOTE — Assessment & Plan Note (Addendum)
Secondary to catheter related blood stream infection. Tunneled catheter removed by IR on 3/13. ID now recommending SNF - IV Rocephin until April 24 Tentatively, final stop date per ID. Needs weekly CBC and CMP faxed to infectious disease clinic from SNF/CIR. She must follow with ID within 1-2 weeks for the final stop date. SNF - iv rocephin intil 4/24 thru PICC; appropriate labs to ID

## 2015-06-25 NOTE — Assessment & Plan Note (Addendum)
:  Secondary to catheter related blood stream infection. Tunneled catheter removed by IR on 3/13.  TEE 3/13 without any vegetations. Repeat blood cultures negative, subsequently midline catheter reinserted. ID now recommending IV Rocephin until April 24 Tentatively, final stop date per ID. Needs weekly CBC and CMP faxed to infectious disease clinic from SNF/CIR. She must follow with ID within 1-2 weeks for the final stop date. SNF - IV rocephin until 4/24 with labs to ID

## 2015-06-25 NOTE — Assessment & Plan Note (Signed)
(  EF 20% by TTE on December 2016): Hx of Nonischemic cardiomyopathy.LHC(4/16) with no significant coronary disease; Cards has OK fro d/c SNF - cont milrinone drip per pump, demadex, spironolactone

## 2015-06-25 NOTE — Assessment & Plan Note (Signed)
s/p window 07/26/14-cytology was negative. CT abdomen done on admission shows a large pericardial effusion-however TEE 3/13 showed moderate pericardial effusion-no tamponade SNF - will monitor VS

## 2015-06-26 ENCOUNTER — Encounter (HOSPITAL_COMMUNITY): Payer: Self-pay

## 2015-06-28 ENCOUNTER — Ambulatory Visit (HOSPITAL_COMMUNITY)
Admission: RE | Admit: 2015-06-28 | Discharge: 2015-06-28 | Disposition: A | Discharge status: Home / Self Care | Payer: No Typology Code available for payment source | Source: Ambulatory Visit | Attending: Internal Medicine | Admitting: Internal Medicine

## 2015-06-28 ENCOUNTER — Encounter (HOSPITAL_COMMUNITY): Payer: Medicare Other

## 2015-06-28 ENCOUNTER — Encounter (HOSPITAL_COMMUNITY): Payer: Self-pay

## 2015-06-28 VITALS — BP 108/60 | HR 68 | Wt 89.0 lb

## 2015-06-28 DIAGNOSIS — A4101 Sepsis due to Methicillin susceptible Staphylococcus aureus: Secondary | ICD-10-CM | POA: Diagnosis not present

## 2015-06-28 DIAGNOSIS — I5022 Chronic systolic (congestive) heart failure: Secondary | ICD-10-CM | POA: Insufficient documentation

## 2015-06-28 DIAGNOSIS — Z9119 Patient's noncompliance with other medical treatment and regimen: Secondary | ICD-10-CM | POA: Diagnosis not present

## 2015-06-28 DIAGNOSIS — I428 Other cardiomyopathies: Secondary | ICD-10-CM | POA: Diagnosis not present

## 2015-06-28 DIAGNOSIS — Z7901 Long term (current) use of anticoagulants: Secondary | ICD-10-CM | POA: Diagnosis not present

## 2015-06-28 DIAGNOSIS — I4891 Unspecified atrial fibrillation: Secondary | ICD-10-CM

## 2015-06-28 DIAGNOSIS — E440 Moderate protein-calorie malnutrition: Secondary | ICD-10-CM

## 2015-06-28 DIAGNOSIS — I313 Pericardial effusion (noninflammatory): Secondary | ICD-10-CM | POA: Insufficient documentation

## 2015-06-28 DIAGNOSIS — I110 Hypertensive heart disease with heart failure: Secondary | ICD-10-CM | POA: Diagnosis not present

## 2015-06-28 DIAGNOSIS — I480 Paroxysmal atrial fibrillation: Secondary | ICD-10-CM | POA: Diagnosis not present

## 2015-06-28 DIAGNOSIS — M4644 Discitis, unspecified, thoracic region: Secondary | ICD-10-CM

## 2015-06-28 DIAGNOSIS — M549 Dorsalgia, unspecified: Secondary | ICD-10-CM | POA: Insufficient documentation

## 2015-06-28 DIAGNOSIS — Z79899 Other long term (current) drug therapy: Secondary | ICD-10-CM | POA: Insufficient documentation

## 2015-06-28 DIAGNOSIS — Z86718 Personal history of other venous thrombosis and embolism: Secondary | ICD-10-CM | POA: Insufficient documentation

## 2015-06-28 DIAGNOSIS — J189 Pneumonia, unspecified organism: Secondary | ICD-10-CM | POA: Insufficient documentation

## 2015-06-28 LAB — HEPATIC FUNCTION PANEL
ALT: 13 U/L — ABNORMAL LOW (ref 14–54)
AST: 29 U/L (ref 15–41)
Albumin: 2.9 g/dL — ABNORMAL LOW (ref 3.5–5.0)
Alkaline Phosphatase: 84 U/L (ref 38–126)
BILIRUBIN DIRECT: 0.1 mg/dL (ref 0.1–0.5)
BILIRUBIN INDIRECT: 0.3 mg/dL (ref 0.3–0.9)
BILIRUBIN TOTAL: 0.4 mg/dL (ref 0.3–1.2)
Total Protein: 8 g/dL (ref 6.5–8.1)

## 2015-06-28 LAB — BASIC METABOLIC PANEL
Anion gap: 14 (ref 5–15)
BUN: 24 mg/dL — AB (ref 6–20)
CHLORIDE: 96 mmol/L — AB (ref 101–111)
CO2: 23 mmol/L (ref 22–32)
CREATININE: 1.1 mg/dL — AB (ref 0.44–1.00)
Calcium: 9.3 mg/dL (ref 8.9–10.3)
GFR calc Af Amer: 58 mL/min — ABNORMAL LOW (ref >60)
GFR calc non Af Amer: 50 mL/min — ABNORMAL LOW (ref >60)
GLUCOSE: 199 mg/dL — AB (ref 65–99)
POTASSIUM: 4.1 mmol/L (ref 3.5–5.1)
SODIUM: 133 mmol/L — AB (ref 135–145)

## 2015-06-28 LAB — CBC
HEMATOCRIT: 34.2 % — AB (ref 36.0–46.0)
Hemoglobin: 10.6 g/dL — ABNORMAL LOW (ref 12.0–15.0)
MCH: 26 pg (ref 26.0–34.0)
MCHC: 31 g/dL (ref 30.0–36.0)
MCV: 84 fL (ref 78.0–100.0)
PLATELETS: 293 10*3/uL (ref 150–400)
RBC: 4.07 MIL/uL (ref 3.87–5.11)
RDW: 14.4 % (ref 11.5–15.5)
WBC: 6.1 10*3/uL (ref 4.0–10.5)

## 2015-06-28 NOTE — Patient Instructions (Addendum)
Routine lab work today. (bmet cbc) Will notify you of abnormal results  Follow up in 4 weeks with Dr.McLean

## 2015-06-28 NOTE — Progress Notes (Signed)
Patient ID: Katrina Peck, female   DOB: November 28, 1945, 71 y.o.   MRN: [id number]    Advanced Heart Failure Clinic Note   PCP: Dr. Duanne Guess Cardiology: Dr. Shirlee Latch  70 y/o female with history of HTN who was admitted 07/20/14 with 3 months of exertional dyspnea, weight gain, cough, and orthopnea. Found to have EF 20% and a large pericardial effusion. Cath 5/16 no CAD. Hospital course complicated by atrial fibrillation w/ RVR, converted to NSR on amiodarone. Underwent pericardial window (transudate).    She had a repeat echo in 6/16 that showed persistently low LV EF at 15-20% with recurrence of moderate to large pericardial effusion without tamponade. This showed EF 10-15%, mild LV dilation, restrictive diastolic function, moderate to severely decreased RV function, and a moderate to large pericardial effusion similar to 6/16.   Admitted in November with marked volume overload. Diuresed with IV lasix. Discharged on milrinone 0.125 mcg. BB and ACE stopped. Discharge weight was 92 pounds.   She had problems with her PICC line (numbness/pain in right arm/hand) and wanted the PICC line out.  She did not want placement of tunneled catheter.  Therefore, PICC was removed and milrinone stopped.  She re-developed symptomatic low output CHF and was re-admitted in 12/16.  She then had tunneled catheter placed and was restarted on milrinone and was diuresed.  She was discharged home back on milrinone.  Echo in 12/16 showed EF 20% with LV thrombus.  She was put on coumadin.   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 Tunneled cath was removed and milrinone given through peripheral IV for several days. She will complete a course of ceftriaxone x 6 weeks.  Tunneled cath replaced with repeat cultures negative for home inotropic support.    She presents today for post hospital follow up. She is currently at [facility]. Weighed last night in sling at facility. Can't stand very well on her own, but getting better with PT. Main problem remains back pain from discitis. Her milrinone pump was beeping on her arrival, states that it has been doing so for at least 2 days.  Upon our nurse inspection, it was clamped off.  Denies worsening DOE, orthopnea, or PND. No CP. No lightheadedness or dizziness.  Labs  5/16: K 4.6, creatinine 1.0 => 0.81, TSH normal, LFTs normal, TSH normal, BNP 1268 7/16: K 5.3 => 5.4, creatinine 1.0 => 1.02, HCT 39.7, BNP > 4500 => 1082, SPEP negative, urine IFE negative 8/16: K 5, creatinine 0.83 11/30/14: K 4.2 Creatinine 0.79  02/09/2015: K 4.5 Creatinine 1.11 12/16: digoxin < 0.2 1/17: K 3.8, creatinine 0.96, HCT 34.9 3/17: K 3.8, creatinine 0.77  SH: Married, no smoking, no ETOH.   FH: No history of cardiomyopathy or sudden death.    ROS: All systems reviewed and negative except as per HPI.   PMH: 1. Chronic systolic CHF: Nonischemic cardiomyopathy.  LHC/RHC (4/16) with no significant coronary disease; mean RA 5, PA 28/11, mean PCWP 9, CI 3.39.  Echo (4/16): EF 20% with diffuse hypokinesis, moderate RV systolic dysfunction, large pericardial effusion.  No history of drug or ETOH abuse.  No family history of cardiomyopathy.   Echo (6/16) with EF 15-20%, restrictive diastolic function, moderate MR, moderately decreased RV systolic function, PA systolic pressure 49 mmHg, moderate-large pericardial effusion without tamponade.  Echo (7/16) with EF 10-15%, mild LV dilation, diffuse hypokinesis, restrictive diastolic dysfunction, moderate  MR, moderate to severely decreased RV systolic function, moderate TR, PA systolic pressure 51 mmHg, moderate to large pericardial effusion, no change from 6/16.  Echo (8/16) with EF 10-15%, mild to moderately reduced RV systolic function, moderate pericardial effusion (slightly smaller than prior), PASP 63 mmHg.  Cardiac MRI (8/16) with LV EF 11%,  RV EF 30%, mid-wall LGE involving the basal to mid septum (?myocarditis), moderate to large pericardial effusion with no evidence for tamponade.  Echo (12/16): EF 20%, LV thrombus, mild MR, PASP 66 mmHg, moderate pericardial effusion.  2. Pericardial effusion: Large on 4/16 echo.  Patient had pericardial window for diagnostic and therapeutic purposes.  Cytology negative for malignancy.  6/16 echo with recurrence of moderate-large pericardial effusion without tamponade, similar repeat echo in 7/16, effusion slightly smaller in 8/16 by echo and MRI.  Moderate effusion on 12/16 echo.  3. Atrial fibrillation: Atrial fibrillation with RVR, converted to NSR with amiodarone.   4. HTN 5. Noncompliance 6. LV thrombus: Noted on 12/16 echo.  7. MSSA bacteremia and discitis in 3/17, suspect tunneled catheter infection.   Current Outpatient Prescriptions  Medication Sig Dispense Refill  . amiodarone (PACERONE) 100 MG tablet Take 1 tablet (100 mg total) by mouth daily. 30 tablet 5  . azithromycin (ZITHROMAX) 500 MG tablet Take 500 mg by mouth daily. Unitl 06/25/15    . cefTRIAXone 2 g in dextrose 5 % 50 mL Inject 2 g into the vein daily. Tentative stop date 06/28/2015, patient must follow with ID clinic within 1-2 weeks. Final stop date per ID. Please draw CBC and CMP once a week and fax the results to the ID office of Dr. Orvan Falconer. 20 ampule 1  . digoxin 62.5 MCG TABS Take 0.0625 mg by mouth daily. 30 tablet 6  . feeding supplement, ENSURE ENLIVE, (ENSURE ENLIVE) LIQD Take 237 mLs by mouth 2 (two) times daily between meals. 237 mL 12  . Magnesium Oxide 200 MG TABS Take 1 tablet (200 mg total) by mouth 2 (two) times daily. 90 each 3  . milrinone (PRIMACOR) 20 MG/100ML SOLN infusion Inject 11.6 mcg/min into the vein continuous. 100 mL 0  . oxyCODONE (OXY IR/ROXICODONE) 5 MG immediate release tablet Take 2 tablets (10 mg total) by mouth every 4 (four) hours as needed for moderate pain or severe pain. 30 tablet 0  .  potassium chloride SA (K-DUR,KLOR-CON) 20 MEQ tablet Take 20 mEq by mouth daily.    Marland Kitchen spironolactone (ALDACTONE) 25 MG tablet Take 0.5 tablets (12.5 mg total) by mouth daily. 15 tablet 6  . torsemide (DEMADEX) 20 MG tablet Take 1 tablet (20 mg total) by mouth daily. 30 tablet 0   No current facility-administered medications for this encounter.    No Known Allergies    Filed Vitals:   06/28/15 1413  BP: 108/60  Pulse: 68  Weight: 89 lb (40.37 kg)  SpO2: 100%   Wt Readings from Last 3 Encounters:  06/28/15 89 lb (40.37 kg)  06/20/15 99 lb (44.906 kg)  06/15/15 99 lb (44.906 kg)     PHYSICAL EXAM: General: Elderly, chronically ill appearing.  Thin. HEENT: normal Neck: supple. JVP 7-8  cm.  Carotids 2+ bilat; no bruits. No lymphadenopathy or thryomegaly appreciated. Cor: PMI nondisplaced. Regular rate & rhythm. Paradoxical S2 split Lungs: Clear  Abdomen: soft, nontender, + distended. No hepatosplenomegaly. No bruits or masses. +BS Extremities: no cyanosis, clubbing, rash.  No edeam. RUE PICC.    Neuro: alert & oriented x 3, cranial nerves grossly  intact. moves all 4 extremities w/o difficulty. Affect pleasant.  ASSESSMENT & PLAN: 1. Chronic systolic HF: Due to nonischemic cardiomyopathy.  cMRI (8/16) showed LV EF 11%, RV EF 30%, and patchy mid-wall LGE in the basal-mid septum that may suggest prior myocarditis.  She is now back on milrinone 0.25 via tunneled catheter with no problems.  Echo (12/16) with EF 20% and LV thrombus. NYHA class II-III symptoms, more limited by back pain at this point.  She is not volume overloaded on exam.  - Continue torsemide 20 mg daily - Continue milrinone 0.25. It is likely that this has been on hold for at least 2 days with PICC clamped off.  She has not noticed. - Continue current digoxin, check level at next appt.  - Continue spironolactone.  - No beta blocker with low output.  - She has advanced heart failure with high risk of  morbidity/mortality over the next 6 months but she has great difficulty following cardiology recommendations. Have previously discussed LVAD but do not think she will be a good candidate with compliance issues.  She also has not been interested in having an LVAD. - Previous EKG LBBB-like IVCD, QRS 138 msec.  Unlikely that she would derive enough benefit from CRT to get off milrinone.  It is unlikely that she would agree to device placement. 2. PAF:  NSR today.  - Continue amio to 100 mg daily. Check TSH, LFTs. She was told to get regular eye exams while on amiodarone.  - Continue warfarin.  3. Pericardial effusion: S/p pericardial window. Transudate, cytology negative.  Last ECHO 02/2015 EF 10-15% and stable moderate pericardial effusion => no tamponade.  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 4. LV thrombus: She is on warfarin.  5. S/p MSSA and Klebsiella bactermia with associated T11-T12 discitis. Suspect this was a tunneled catheter infection.  - on ceftriaxone per ID. Stop date TBD, recommended length of therapy was 6 weeks.  - Sees Dr Luciana Axe with ID on 5/2 6. HCAP: Was diagnosed with PNA at [facility] and started on zithromax on 3/21 in addition to ceftriaxone to continue for 10 days  Will get CMET and CBC today. Discussed her milrinone pump situation with Jeri Modena, of [facility]. Will ask her to also follow up with facility. Continue to follow up with ID.   Follow up 4 weeks with MD.  Graciella Freer, PA-C 06/28/2015  Patient seen with PA, agree with the above note.  Main problem remains back pain from her recent discitis.  Mobility is significantly limited and she remains at SNF.  On exam, she is not volume overloaded.    Will continue current milrinone and torsemide.  Need to check CMET, TSH.  Will get digoxin at next visit as labs already drawn and it was not yet done.    She remains in NSR on amiodarone and warfarin.   She is a poor candidate for LVAD with compliance  issues.  She also has said that she would not want an LVAD.    She remains on ceftriaxone, will followup with ID to finalize course.   [name] 06/28/2015 4:00 PM

## 2015-06-28 NOTE — Progress Notes (Signed)
Advanced Heart Failure Medication Review by a Pharmacist  Does the patient  feel that his/her medications are working for him/her?  yes  Has the patient been experiencing any side effects to the medications prescribed?  no  Does the patient measure his/her own blood pressure or blood glucose at home?  yes   Does the patient have any problems obtaining medications due to transportation or finances?   no  Understanding of regimen: fair Understanding of indications: fair Potential of compliance: excellent Patient understands to avoid NSAIDs. Patient understands to avoid decongestants.  Issues to address at subsequent visits: None   Pharmacist comments:  Katrina Peck is a 70 yo F well known to me with a history of non-compliance now residing in a SNF. I have updated her medication list based on her current MAR. No discrepancies or issues identified.   Tyler Deis. Bonnye Fava, PharmD, BCPS, CPP Clinical Pharmacist Pager: [phone] Phone: [phone] 06/28/2015 2:26 PM      Time with patient: 4 minutes Preparation and documentation time: 6 minutes Total time: 10 minutes

## 2015-07-01 LAB — POCT INR: INR: 1.7 — AB (ref <=1.1)

## 2015-07-01 LAB — PROTIME-INR: Protime: 20.1 seconds — AB (ref 10.0–13.8)

## 2015-07-11 ENCOUNTER — Encounter: Payer: Self-pay | Admitting: Internal Medicine

## 2015-07-11 ENCOUNTER — Non-Acute Institutional Stay (SKILLED_NURSING_FACILITY): Payer: Medicare Other | Admitting: Internal Medicine

## 2015-07-11 DIAGNOSIS — M4644 Discitis, unspecified, thoracic region: Secondary | ICD-10-CM

## 2015-07-11 DIAGNOSIS — I272 Other secondary pulmonary hypertension: Secondary | ICD-10-CM | POA: Diagnosis not present

## 2015-07-11 DIAGNOSIS — I110 Hypertensive heart disease with heart failure: Secondary | ICD-10-CM

## 2015-07-11 DIAGNOSIS — T80219D Unspecified infection due to central venous catheter, subsequent encounter: Secondary | ICD-10-CM | POA: Diagnosis not present

## 2015-07-11 DIAGNOSIS — I513 Intracardiac thrombosis, not elsewhere classified: Secondary | ICD-10-CM

## 2015-07-11 DIAGNOSIS — A4101 Sepsis due to Methicillin susceptible Staphylococcus aureus: Secondary | ICD-10-CM

## 2015-07-11 DIAGNOSIS — I5041 Acute combined systolic (congestive) and diastolic (congestive) heart failure: Secondary | ICD-10-CM

## 2015-07-11 DIAGNOSIS — R911 Solitary pulmonary nodule: Secondary | ICD-10-CM

## 2015-07-11 DIAGNOSIS — I213 ST elevation (STEMI) myocardial infarction of unspecified site: Secondary | ICD-10-CM | POA: Diagnosis not present

## 2015-07-11 DIAGNOSIS — E43 Unspecified severe protein-calorie malnutrition: Secondary | ICD-10-CM

## 2015-07-11 DIAGNOSIS — D62 Acute posthemorrhagic anemia: Secondary | ICD-10-CM

## 2015-07-11 DIAGNOSIS — I3139 Other pericardial effusion (noninflammatory): Secondary | ICD-10-CM

## 2015-07-11 DIAGNOSIS — I480 Paroxysmal atrial fibrillation: Secondary | ICD-10-CM

## 2015-07-11 DIAGNOSIS — J189 Pneumonia, unspecified organism: Secondary | ICD-10-CM

## 2015-07-11 DIAGNOSIS — I319 Disease of pericardium, unspecified: Secondary | ICD-10-CM

## 2015-07-11 DIAGNOSIS — A414 Sepsis due to anaerobes: Secondary | ICD-10-CM | POA: Diagnosis not present

## 2015-07-11 DIAGNOSIS — I313 Pericardial effusion (noninflammatory): Secondary | ICD-10-CM

## 2015-07-11 NOTE — Progress Notes (Deleted)
MRN: [id number] Name: Katrina Peck  Sex: female Age: 70 y.o. DOB: [birth year]  PSC #:  Facility/Room: Level Of Care: SNF Provider: Merrilee Seashore  MD Emergency Contacts: Extended Emergency Contact Information Primary Emergency Contact: Amison,[name] Address: [address]          [place], [place] [address] [place] Home Phone: [phone] Mobile Phone: [phone] Relation: Spouse Secondary Emergency Contact: Pleasant,Raneta Address: [address]          [place], [place] [address] [name] Home Phone: [phone] Mobile Phone: [phone] Relation: Daughter  Code Status:   Allergies: Review of patient's allergies indicates no known allergies.  Chief Complaint  Patient presents with  . Discharge Note    HPI: Patient is 70 y.o. female who  Past Medical History  Diagnosis Date  . Anemia   . Hypertension   . Blood transfusion without reported diagnosis   . Chronic systolic CHF (congestive heart failure) (HCC)     a. 06/2014 Echo: EF 20%.  Marland Kitchen NICM (nonischemic cardiomyopathy) (HCC)     a. 06/2014 Echo: EF 20%, diff HK, mild MR, mildly dil LA/RA, mildly reduced RV fxn;  b. 06/2014 Cath: nl cors.  . Pericardial effusion     a. 06/2014 Large effusion, no tamponade phys.    Past Surgical History  Procedure Laterality Date  . No past surgeries    . Left and right heart catheterization with coronary angiogram N/A 07/23/2014    Procedure: LEFT AND RIGHT HEART CATHETERIZATION WITH CORONARY ANGIOGRAM;  Surgeon: Laurey Morale, MD;  Location: [facility] CATH LAB;  Service: Cardiovascular;  Laterality: N/A;  . Subxyphoid pericardial window N/A 07/26/2014    Procedure: SUBXYPHOID PERICARDIAL WINDOW;  Surgeon: Delight Ovens, MD;  Location: [facility] OR;  Service: Thoracic;  Laterality: N/A;  . Tee without cardioversion N/A 07/26/2014    Procedure: TRANSESOPHAGEAL ECHOCARDIOGRAM (TEE);  Surgeon: Delight Ovens, MD;  Location: [facility] OR;  Service: Thoracic;   Laterality: N/A;  . Pleural effusion drainage N/A 07/26/2014    Procedure: DRAINAGE OF PERICARDIAL EFFUSION;  Surgeon: Delight Ovens, MD;  Location: [facility] OR;  Service: Thoracic;  Laterality: N/A;  . Cardiac catheterization N/A 02/07/2015    Procedure: Right Heart Cath;  Surgeon: Laurey Morale, MD;  Location: [facility] INVASIVE CV LAB;  Service: Cardiovascular;  Laterality: N/A;  . Tee without cardioversion N/A 06/06/2015    Procedure: TRANSESOPHAGEAL ECHOCARDIOGRAM (TEE);  Surgeon: Dolores Patty, MD;  Location: [facility] ENDOSCOPY;  Service: Cardiovascular;  Laterality: N/A;  . Radiology with anesthesia N/A 06/08/2015    Procedure: MRI CERVICAL SPINE WITH/WITHOUT LUMBAR WITH/WITHOUT THORACIC WITH/WITHOUT       (RADIOLOGY WITH ANESTHESIA);  Surgeon: Medication Radiologist, MD;  Location: [facility] OR;  Service: Radiology;  Laterality: N/A;      Medication List       This list is accurate as of: 07/11/15 12:38 PM.  Always use your most recent med list.               amiodarone 100 MG tablet  Commonly known as:  PACERONE  Take 1 tablet (100 mg total) by mouth daily.     cefTRIAXone 2 g in dextrose 5 % 50 mL  Inject 2 g into the vein daily. Tentative stop date 06/28/2015, patient must follow with ID clinic within 1-2 weeks. Final stop date per ID. Please draw CBC and CMP once a week and fax the results to the ID office of Dr. Orvan Falconer.  Digoxin 62.5 MCG Tabs  Take 0.0625 mg by mouth daily.     feeding supplement (ENSURE ENLIVE) Liqd  Take 237 mLs by mouth 2 (two) times daily between meals.     Magnesium Oxide 200 MG Tabs  Take 1 tablet (200 mg total) by mouth 2 (two) times daily.     methocarbamol 500 MG tablet  Commonly known as:  ROBAXIN  Take 500 mg by mouth 2 (two) times daily. In the afternoon and at bedtime     milrinone 20 MG/100ML Soln infusion  Commonly known as:  PRIMACOR  Inject 11.6 mcg/min into the vein continuous.     oxyCODONE 5 MG immediate release tablet  Commonly known  as:  Oxy IR/ROXICODONE  Take 2 tablets (10 mg total) by mouth every 4 (four) hours as needed for moderate pain or severe pain.     potassium chloride SA 20 MEQ tablet  Commonly known as:  K-DUR,KLOR-CON  Take 20 mEq by mouth daily.     spironolactone 25 MG tablet  Commonly known as:  ALDACTONE  Take 0.5 tablets (12.5 mg total) by mouth daily.     torsemide 20 MG tablet  Commonly known as:  DEMADEX  Take 1 tablet (20 mg total) by mouth daily.     warfarin 2 MG tablet  Commonly known as:  COUMADIN  Take 2 mg by mouth at bedtime.        Meds ordered this encounter  Medications  . warfarin (COUMADIN) 2 MG tablet    Sig: Take 2 mg by mouth at bedtime.     There is no immunization history on file for this patient.  Social History  Substance Use Topics  . Smoking status: Never Smoker   . Smokeless tobacco: Never Used  . Alcohol Use: No    Filed Vitals:   07/11/15 1219  BP: 113/76  Pulse: 69  Temp: 95.8 F (35.4 C)  Resp: 17    Physical Exam  GENERAL APPEARANCE: Alert, conversant. No acute distress.  HEENT: Unremarkable. RESPIRATORY: Breathing is even, unlabored. Lung sounds are clear   CARDIOVASCULAR: Heart RRR no murmurs, rubs or gallops. No peripheral edema.  GASTROINTESTINAL: Abdomen is soft, non-tender, not distended w/ normal bowel sounds.  NEUROLOGIC: Cranial nerves 2-12 grossly intact. Moves all extremities  Patient Active Problem List   Diagnosis Date Noted  . Klebsiella pneumoniae sepsis (HCC) 06/25/2015  . Pulmonary hypertension (HCC) 06/25/2015  . HCAP (healthcare-associated pneumonia) 06/25/2015  . Lung nodule   . Discitis of thoracic region   . Malnutrition of moderate degree 06/07/2015  . Acute combined systolic and diastolic congestive heart failure (HCC)   . Leukocytosis   . Acute blood loss anemia   . Bacteremia   . MSSA (methicillin susceptible Staphylococcus aureus) septicemia (HCC) 06/05/2015  . Back spasm 06/05/2015  . Back pain   .  Neck pain   . Acute on chronic systolic CHF (congestive heart failure), NYHA class 4 (HCC)   . Central line infection   . Atrial fibrillation (HCC) [I48.91] 03/31/2015  . Long term (current) use of anticoagulants [Z79.01] 03/31/2015  . Encounter for therapeutic drug monitoring 03/31/2015  . Non compliance w medication regimen 03/28/2015  . Protein-calorie malnutrition, severe (HCC) 03/28/2015  . Acute on chronic systolic congestive heart failure (HCC)   . LV (left ventricular) mural thrombus (HCC)   . Acute systolic CHF (congestive heart failure) (HCC) 02/04/2015  . Acute on chronic systolic (congestive) heart failure (HCC) 02/04/2015  . Elevated troponin   .  Acute on chronic systolic and diastolic heart failure, NYHA class 4 (HCC) 11/11/2014  . Paroxysmal atrial fibrillation (HCC) 09/29/2014  . Chronic systolic heart failure (HCC) 08/07/2014  . Acute systolic CHF (congestive heart failure), NYHA class 4 (HCC) 07/24/2014  . NICM (nonischemic cardiomyopathy) (HCC) 07/24/2014  . 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 Pericardial effusion 07/24/2014  . Acute CHF (HCC) 07/20/2014    CBC    Component Value Date/Time   WBC 6.1 06/28/2015 1451   WBC 8.2 06/13/2015   WBC 7.6 12/24/2013 1707   RBC 4.07 06/28/2015 1451   RBC 4.77 12/24/2013 1707   HGB 10.6* 06/28/2015 1451   HGB 13.0 12/24/2013 1707   HCT 34.2* 06/28/2015 1451   HCT 42.1 12/24/2013 1707   PLT 293 06/28/2015 1451   MCV 84.0 06/28/2015 1451   MCV 88.2 12/24/2013 1707   LYMPHSABS 1.2 06/13/2015 0509   MONOABS 0.6 06/13/2015 0509   EOSABS 0.1 06/13/2015 0509   BASOSABS 0.0 06/13/2015 0509    CMP     Component Value Date/Time   NA 133* 06/28/2015 1451   K 4.1 06/28/2015 1451   CL 96* 06/28/2015 1451   CO2 23 06/28/2015 1451   GLUCOSE 199* 06/28/2015 1451   BUN 24* 06/28/2015 1451   CREATININE 1.10* 06/28/2015 1451   CREATININE 0.64 12/24/2013 1658   CALCIUM 9.3  06/28/2015 1451   PROT 8.0 06/28/2015 1451   ALBUMIN 2.9* 06/28/2015 1451   AST 29 06/28/2015 1451   ALT 13* 06/28/2015 1451   ALKPHOS 84 06/28/2015 1451   BILITOT 0.4 06/28/2015 1451   GFRNONAA 50* 06/28/2015 1451   GFRAA 58* 06/28/2015 1451    Assessment and Plan  No problem-specific assessment & plan notes found for this encounter.   Merrilee Seashore  MD

## 2015-07-11 NOTE — Progress Notes (Signed)
MRN: [id number] Name: Katrina Peck  Sex: female Age: 70 y.o. DOB: [birth year]  PSC #: [name] Facility/Room: Level Of Care: SNF Provider:Rondel Episcopo, Thurston Hole MD   Emergency Contacts: Extended Emergency Contact Information Primary Emergency Contact: Casillas,[name] Address: [address]          [place], [place] [address] [place] Home Phone: [phone] Mobile Phone: [phone] Relation: Spouse Secondary Emergency Contact: Pleasant,Raneta Address: [address]          [place], [place] [address] [name] Home Phone: [phone] Mobile Phone: [phone] Relation: Daughter  Code Status: FullCode  Allergies: Review of patient's allergies indicates no known allergies.  Chief Complaint  Patient presents with  . Discharge Note    HPI: Patient is 70 y.o. female with chronic systolic heart failure-on milrinone infusion at home via a tunneled catheter, presented to the hospital with severe lower back pain. Found to have significant leukocytosis. 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 Seen by infectious disease, recommendations for IV Rocephin until April 24. Hospital course was further complicated by acute on chronic CHF. Pt was admitted to [facility] on 3/20 and transferred to [facility] on 3/22 for supportive care for generalized weakness and for long course of IV antibiotics. Pt is now ready to be d/c to home.  Past Medical History  Diagnosis Date  . Anemia   . Hypertension   . Blood transfusion without reported diagnosis   . Chronic systolic CHF (congestive heart failure) (HCC)     a. 06/2014 Echo: EF 20%.  Marland Kitchen NICM (nonischemic cardiomyopathy) (HCC)     a. 06/2014 Echo: EF 20%, diff HK, mild MR, mildly dil LA/RA, mildly reduced RV fxn;  b. 06/2014 Cath: nl cors.  . Pericardial effusion      a. 06/2014 Large effusion, no tamponade phys.    Past Surgical History  Procedure Laterality Date  . No past surgeries    . Left and right heart catheterization with coronary angiogram N/A 07/23/2014    Procedure: LEFT AND RIGHT HEART CATHETERIZATION WITH CORONARY ANGIOGRAM;  Surgeon: Laurey Morale, MD;  Location: [facility] CATH LAB;  Service: Cardiovascular;  Laterality: N/A;  . Subxyphoid pericardial window N/A 07/26/2014    Procedure: SUBXYPHOID PERICARDIAL WINDOW;  Surgeon: Delight Ovens, MD;  Location: [facility] OR;  Service: Thoracic;  Laterality: N/A;  . Tee without cardioversion N/A 07/26/2014    Procedure: TRANSESOPHAGEAL ECHOCARDIOGRAM (TEE);  Surgeon: Delight Ovens, MD;  Location: [facility] OR;  Service: Thoracic;  Laterality: N/A;  . Pleural effusion drainage N/A 07/26/2014    Procedure: DRAINAGE OF PERICARDIAL EFFUSION;  Surgeon: Delight Ovens, MD;  Location: [facility] OR;  Service: Thoracic;  Laterality: N/A;  . Cardiac catheterization N/A 02/07/2015    Procedure: Right Heart Cath;  Surgeon: Laurey Morale, MD;  Location: [facility] INVASIVE CV LAB;  Service: Cardiovascular;  Laterality: N/A;  . Tee without cardioversion N/A 06/06/2015    Procedure: TRANSESOPHAGEAL ECHOCARDIOGRAM (TEE);  Surgeon: Dolores Patty, MD;  Location: [facility] ENDOSCOPY;  Service: Cardiovascular;  Laterality: N/A;  . Radiology with anesthesia N/A 06/08/2015    Procedure: MRI CERVICAL SPINE WITH/WITHOUT LUMBAR WITH/WITHOUT THORACIC WITH/WITHOUT       (RADIOLOGY WITH ANESTHESIA);  Surgeon: Medication Radiologist, MD;  Location: [facility] OR;  Service: Radiology;  Laterality: N/A;      Medication List  This list is accurate as of: 07/11/15 11:59 PM.  Always use your most recent med list.               amiodarone 100 MG tablet  Commonly known as:  PACERONE  Take 1 tablet (100 mg total) by mouth daily.     cefTRIAXone 2 g in dextrose 5 % 50 mL  Inject 2 g into the vein daily. Tentative stop date 06/28/2015, patient must follow  with ID clinic within 1-2 weeks. Final stop date per ID. Please draw CBC and CMP once a week and fax the results to the ID office of Dr. Orvan Falconer.     Digoxin 62.5 MCG Tabs  Take 0.0625 mg by mouth daily.     feeding supplement (ENSURE ENLIVE) Liqd  Take 237 mLs by mouth 2 (two) times daily between meals.     Magnesium Oxide 200 MG Tabs  Take 1 tablet (200 mg total) by mouth 2 (two) times daily.     methocarbamol 500 MG tablet  Commonly known as:  ROBAXIN  Take 500 mg by mouth 2 (two) times daily. In the afternoon and at bedtime     milrinone 20 MG/100ML Soln infusion  Commonly known as:  PRIMACOR  Inject 11.6 mcg/min into the vein continuous.     oxyCODONE 5 MG immediate release tablet  Commonly known as:  Oxy IR/ROXICODONE  Take 2 tablets (10 mg total) by mouth every 4 (four) hours as needed for moderate pain or severe pain.     potassium chloride SA 20 MEQ tablet  Commonly known as:  K-DUR,KLOR-CON  Take 20 mEq by mouth daily.     spironolactone 25 MG tablet  Commonly known as:  ALDACTONE  Take 0.5 tablets (12.5 mg total) by mouth daily.     torsemide 20 MG tablet  Commonly known as:  DEMADEX  Take 1 tablet (20 mg total) by mouth daily.     warfarin 2 MG tablet  Commonly known as:  COUMADIN  Take 2 mg by mouth at bedtime.        Meds ordered this encounter  Medications  . warfarin (COUMADIN) 2 MG tablet    Sig: Take 2 mg by mouth at bedtime.     There is no immunization history on file for this patient.  Social History  Substance Use Topics  . Smoking status: Never Smoker   . Smokeless tobacco: Never Used  . Alcohol Use: No    Filed Vitals:   07/11/15 1219  BP: 113/76  Pulse: 69  Temp: 95.8 F (35.4 C)  Resp: 17    Physical Exam  GENERAL APPEARANCE: Alert, conversant. No acute distress.  HEENT: Unremarkable. RESPIRATORY: Breathing is even, unlabored. Lung sounds are clear   CARDIOVASCULAR: Heart RRR no murmurs, rubs or gallops. No peripheral  edema.  GASTROINTESTINAL: Abdomen is soft, non-tender, not distended w/ normal bowel sounds.  NEUROLOGIC: Cranial nerves 2-12 grossly intact. Moves all extremities  Patient Active Problem List   Diagnosis Date Noted  . Klebsiella pneumoniae sepsis (HCC) 06/25/2015  . Pulmonary hypertension (HCC) 06/25/2015  . HCAP (healthcare-associated pneumonia) 06/25/2015  . Lung nodule   . Discitis of thoracic region   . Malnutrition of moderate degree 06/07/2015  . Acute combined systolic and diastolic congestive heart failure (HCC)   . Leukocytosis   . Acute blood loss anemia   . Bacteremia   . MSSA (methicillin susceptible Staphylococcus aureus) septicemia (HCC) 06/05/2015  . Back spasm 06/05/2015  . Back  pain   . Neck pain   . Acute on chronic systolic CHF (congestive heart failure), NYHA class 4 (HCC)   . Central line infection   . Atrial fibrillation (HCC) [I48.91] 03/31/2015  . Long term (current) use of anticoagulants [Z79.01] 03/31/2015  . Encounter for therapeutic drug monitoring 03/31/2015  . Non compliance w medication regimen 03/28/2015  . Protein-calorie malnutrition, severe (HCC) 03/28/2015  . Acute on chronic systolic congestive heart failure (HCC)   . LV (left ventricular) mural thrombus (HCC)   . Acute systolic CHF (congestive heart failure) (HCC) 02/04/2015  . Acute on chronic systolic (congestive) heart failure (HCC) 02/04/2015  . Elevated troponin   . Acute on chronic systolic and diastolic heart failure, NYHA class 4 (HCC) 11/11/2014  . Paroxysmal atrial fibrillation (HCC) 09/29/2014  . Chronic systolic heart failure (HCC) 08/07/2014  . Acute systolic CHF (congestive heart failure), NYHA class 4 (HCC) 07/24/2014  . NICM (nonischemic cardiomyopathy) (HCC) 07/24/2014  . 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 Pericardial effusion 07/24/2014  . Acute CHF (HCC) 07/20/2014    CBC    Component Value Date/Time   WBC 6.1  06/28/2015 1451   WBC 8.2 06/13/2015   WBC 7.6 12/24/2013 1707   RBC 4.07 06/28/2015 1451   RBC 4.77 12/24/2013 1707   HGB 10.6* 06/28/2015 1451   HGB 13.0 12/24/2013 1707   HCT 34.2* 06/28/2015 1451   HCT 42.1 12/24/2013 1707   PLT 293 06/28/2015 1451   MCV 84.0 06/28/2015 1451   MCV 88.2 12/24/2013 1707   LYMPHSABS 1.2 06/13/2015 0509   MONOABS 0.6 06/13/2015 0509   EOSABS 0.1 06/13/2015 0509   BASOSABS 0.0 06/13/2015 0509    CMP     Component Value Date/Time   NA 133* 06/28/2015 1451   K 4.1 06/28/2015 1451   CL 96* 06/28/2015 1451   CO2 23 06/28/2015 1451   GLUCOSE 199* 06/28/2015 1451   BUN 24* 06/28/2015 1451   CREATININE 1.10* 06/28/2015 1451   CREATININE 0.64 12/24/2013 1658   CALCIUM 9.3 06/28/2015 1451   PROT 8.0 06/28/2015 1451   ALBUMIN 2.9* 06/28/2015 1451   AST 29 06/28/2015 1451   ALT 13* 06/28/2015 1451   ALKPHOS 84 06/28/2015 1451   BILITOT 0.4 06/28/2015 1451   GFRNONAA 50* 06/28/2015 1451   GFRAA 58* 06/28/2015 1451    Assessment and Plan  Pt is d/c to home with HH/OT/PT/nursing. Medications have been reconciled and rx's written.   Time spent > 30 min;> 50% of time with patient was spent reviewing records, labs, tests and studies, counseling and developing plan of care  Twylia Oka  MD

## 2015-07-14 DIAGNOSIS — I110 Hypertensive heart disease with heart failure: Secondary | ICD-10-CM | POA: Diagnosis not present

## 2015-07-14 DIAGNOSIS — M4644 Discitis, unspecified, thoracic region: Secondary | ICD-10-CM | POA: Diagnosis not present

## 2015-07-14 DIAGNOSIS — I5043 Acute on chronic combined systolic (congestive) and diastolic (congestive) heart failure: Secondary | ICD-10-CM | POA: Diagnosis not present

## 2015-07-14 DIAGNOSIS — E440 Moderate protein-calorie malnutrition: Secondary | ICD-10-CM | POA: Diagnosis not present

## 2015-07-14 DIAGNOSIS — Z5181 Encounter for therapeutic drug level monitoring: Secondary | ICD-10-CM | POA: Diagnosis not present

## 2015-07-14 DIAGNOSIS — I429 Cardiomyopathy, unspecified: Secondary | ICD-10-CM | POA: Diagnosis not present

## 2015-07-14 DIAGNOSIS — I480 Paroxysmal atrial fibrillation: Secondary | ICD-10-CM | POA: Diagnosis not present

## 2015-07-14 DIAGNOSIS — Z452 Encounter for adjustment and management of vascular access device: Secondary | ICD-10-CM | POA: Diagnosis not present

## 2015-07-18 ENCOUNTER — Ambulatory Visit (INDEPENDENT_AMBULATORY_CARE_PROVIDER_SITE_OTHER): Payer: Medicare Other | Admitting: Pharmacist

## 2015-07-18 DIAGNOSIS — I480 Paroxysmal atrial fibrillation: Secondary | ICD-10-CM | POA: Diagnosis not present

## 2015-07-18 DIAGNOSIS — Z5181 Encounter for therapeutic drug level monitoring: Secondary | ICD-10-CM

## 2015-07-18 DIAGNOSIS — I513 Intracardiac thrombosis, not elsewhere classified: Secondary | ICD-10-CM

## 2015-07-18 DIAGNOSIS — E440 Moderate protein-calorie malnutrition: Secondary | ICD-10-CM | POA: Diagnosis not present

## 2015-07-18 DIAGNOSIS — M4644 Discitis, unspecified, thoracic region: Secondary | ICD-10-CM | POA: Diagnosis not present

## 2015-07-18 DIAGNOSIS — I4891 Unspecified atrial fibrillation: Secondary | ICD-10-CM

## 2015-07-18 DIAGNOSIS — I5043 Acute on chronic combined systolic (congestive) and diastolic (congestive) heart failure: Secondary | ICD-10-CM | POA: Diagnosis not present

## 2015-07-18 DIAGNOSIS — I110 Hypertensive heart disease with heart failure: Secondary | ICD-10-CM | POA: Diagnosis not present

## 2015-07-18 DIAGNOSIS — I5022 Chronic systolic (congestive) heart failure: Secondary | ICD-10-CM | POA: Diagnosis not present

## 2015-07-18 DIAGNOSIS — Z7901 Long term (current) use of anticoagulants: Secondary | ICD-10-CM

## 2015-07-18 DIAGNOSIS — I213 ST elevation (STEMI) myocardial infarction of unspecified site: Secondary | ICD-10-CM

## 2015-07-18 DIAGNOSIS — I429 Cardiomyopathy, unspecified: Secondary | ICD-10-CM | POA: Diagnosis not present

## 2015-07-18 LAB — POCT INR: INR: 1.2

## 2015-07-19 ENCOUNTER — Other Ambulatory Visit (HOSPITAL_COMMUNITY): Payer: Self-pay | Admitting: *Deleted

## 2015-07-19 ENCOUNTER — Encounter: Payer: Self-pay | Admitting: Internal Medicine

## 2015-07-20 ENCOUNTER — Ambulatory Visit (HOSPITAL_COMMUNITY)
Admission: RE | Admit: 2015-07-20 | Discharge: 2015-07-20 | Disposition: A | Discharge status: Home / Self Care | Payer: Medicare Other | Source: Ambulatory Visit | Attending: Internal Medicine | Admitting: Internal Medicine

## 2015-07-20 ENCOUNTER — Telehealth: Payer: Self-pay | Admitting: *Deleted

## 2015-07-20 VITALS — BP 90/64 | HR 88 | Wt 95.6 lb

## 2015-07-20 DIAGNOSIS — Z7901 Long term (current) use of anticoagulants: Secondary | ICD-10-CM | POA: Diagnosis not present

## 2015-07-20 DIAGNOSIS — I480 Paroxysmal atrial fibrillation: Secondary | ICD-10-CM | POA: Insufficient documentation

## 2015-07-20 DIAGNOSIS — I110 Hypertensive heart disease with heart failure: Secondary | ICD-10-CM | POA: Diagnosis not present

## 2015-07-20 DIAGNOSIS — Z8619 Personal history of other infectious and parasitic diseases: Secondary | ICD-10-CM | POA: Diagnosis not present

## 2015-07-20 DIAGNOSIS — Z86718 Personal history of other venous thrombosis and embolism: Secondary | ICD-10-CM | POA: Diagnosis not present

## 2015-07-20 DIAGNOSIS — I428 Other cardiomyopathies: Secondary | ICD-10-CM | POA: Diagnosis not present

## 2015-07-20 DIAGNOSIS — Z8701 Personal history of pneumonia (recurrent): Secondary | ICD-10-CM | POA: Insufficient documentation

## 2015-07-20 DIAGNOSIS — I313 Pericardial effusion (noninflammatory): Secondary | ICD-10-CM

## 2015-07-20 DIAGNOSIS — I5022 Chronic systolic (congestive) heart failure: Secondary | ICD-10-CM | POA: Insufficient documentation

## 2015-07-20 DIAGNOSIS — Z79899 Other long term (current) drug therapy: Secondary | ICD-10-CM | POA: Diagnosis not present

## 2015-07-20 DIAGNOSIS — A4101 Sepsis due to Methicillin susceptible Staphylococcus aureus: Secondary | ICD-10-CM

## 2015-07-20 DIAGNOSIS — A414 Sepsis due to anaerobes: Secondary | ICD-10-CM | POA: Diagnosis not present

## 2015-07-20 DIAGNOSIS — I319 Disease of pericardium, unspecified: Secondary | ICD-10-CM

## 2015-07-20 DIAGNOSIS — I3139 Other pericardial effusion (noninflammatory): Secondary | ICD-10-CM

## 2015-07-20 NOTE — Progress Notes (Signed)
Advanced Heart Failure Medication Review by a Pharmacist  Does the patient  feel that his/her medications are working for him/her?  yes  Has the patient been experiencing any side effects to the medications prescribed?  no  Does the patient measure his/her own blood pressure or blood glucose at home?  no   Does the patient have any problems obtaining medications due to transportation or finances?   no  Understanding of regimen: poor Understanding of indications: poor Potential of compliance: poor Patient understands to avoid NSAIDs. Patient understands to avoid decongestants.  Issues to address at subsequent visits: adherence   Pharmacist comments: Katrina Peck is a pleasant 70 yo woman here for f/u in HF clinic with husband. She is unsure of her medication regimen and has questionable adherence to medications. She does take her torsemide every day, however unsure of how she takes her other medications. Reminded Katrina Peck to bring in her medications at her next visit.  Warfarin is managed by Coumadin clinic. Last INR 1.2 and dose was increased to 6 mg on Sunday, Tuesday, Thursday, and Saturday, 4 mg all other days. Reminded her to maintain a consistent diet and to make sure she does not miss any doses of her warfarin.  [name], [place].D., BCPS PGY2 Cardiology Pharmacy Resident Pager: [phone]     Time with patient: 10 min Preparation and documentation time: 5 min Total time: 15 min

## 2015-07-20 NOTE — Telephone Encounter (Signed)
-----   Message from Gardiner Barefoot, MD sent at 07/20/2015  2:18 PM EDT ----- Could you let advanced know we need to treat her with rocephin 2 grams daily for another 4 weeks.  She apparently was off inadvertently through NH for the last 3 weeks.  She already hass appt with me next week. thanks

## 2015-07-20 NOTE — Progress Notes (Signed)
Patient ID: Katrina Peck, female   DOB: [birth year], 70 y.o.   MRN: [id number]    Advanced Heart Failure Clinic Note   PCP: Dr. Duanne Guess Cardiology: Dr. Shirlee Latch  70 y/o female with history of HTN who was admitted 07/20/14 with 3 months of exertional dyspnea, weight gain, cough, and orthopnea. Found to have EF 20% and a large pericardial effusion. Cath 5/16 no CAD. Hospital course complicated by atrial fibrillation w/ RVR, converted to NSR on amiodarone. Underwent pericardial window (transudate).    She had a repeat echo in 6/16 that showed persistently low LV EF at 15-20% with recurrence of moderate to large pericardial effusion without tamponade. This showed EF 10-15%, mild LV dilation, restrictive diastolic function, moderate to severely decreased RV function, and a moderate to large pericardial effusion similar to 6/16.   Admitted in November with marked volume overload. Diuresed with IV lasix. Discharged on milrinone 0.125 mcg. BB and ACE stopped. Discharge weight was 92 pounds.   She had problems with her PICC line (numbness/pain in right arm/hand) and wanted the PICC line out.  She did not want placement of tunneled catheter.  Therefore, PICC was removed and milrinone stopped.  She re-developed symptomatic low output CHF and was re-admitted in 12/16.  She then had tunneled catheter placed and was restarted on milrinone and was diuresed.  She was discharged home back on milrinone.  Echo in 12/16 showed EF 20% with LV thrombus.  She was put on coumadin.   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 Tunneled cath was removed and milrinone given through peripheral IV for several days. She will complete a course of ceftriaxone x 6 weeks.  Tunneled cath replaced with repeat cultures negative for home inotropic support.    She returns for HF follow up. Overall feeling pretty good. Doing well at home.  Back pain improving. Able to walk around the house. Mild dyspnea with exertion. Denies PND/Orthopnea. Appetite ok. Weight at home 92 pounds. She is not sure which medications she is taking.  Continues on home milrinone. [facility] following for home milrinone and labs.   Labs  5/16: K 4.6, creatinine 1.0 => 0.81, TSH normal, LFTs normal, TSH normal, BNP 1268 7/16: K 5.3 => 5.4, creatinine 1.0 => 1.02, HCT 39.7, BNP > 4500 => 1082, SPEP negative, urine IFE negative 8/16: K 5, creatinine 0.83 11/30/14: K 4.2 Creatinine 0.79  02/09/2015: K 4.5 Creatinine 1.11 12/16: digoxin < 0.2 1/17: K 3.8, creatinine 0.96, HCT 34.9 3/17: K 3.8, creatinine 0.77  SH: Married, no smoking, no ETOH.   FH: No history of cardiomyopathy or sudden death.    ROS: All systems reviewed and negative except as per HPI.   PMH: 1. Chronic systolic CHF: Nonischemic cardiomyopathy.  LHC/RHC (4/16) with no significant coronary disease; mean RA 5, PA 28/11, mean PCWP 9, CI 3.39.  Echo (4/16): EF 20% with diffuse hypokinesis, moderate RV systolic dysfunction, large pericardial effusion.  No history of drug or ETOH abuse.  No family history of cardiomyopathy.   Echo (6/16) with EF 15-20%, restrictive diastolic function, moderate MR, moderately decreased RV systolic function, PA systolic pressure 49 mmHg, moderate-large pericardial effusion without tamponade.  Echo (7/16) with EF 10-15%, mild LV dilation, diffuse hypokinesis, restrictive diastolic dysfunction, moderate MR, moderate to severely decreased RV systolic function, moderate TR, PA systolic pressure 51 mmHg, moderate to large pericardial effusion, no change from 6/16.  Echo (  8/16) with EF 10-15%, mild to moderately reduced RV systolic function, moderate pericardial effusion (slightly smaller than prior), PASP 63 mmHg.  Cardiac MRI (8/16) with LV EF 11%, RV EF 30%, mid-wall LGE involving the basal to mid septum (?myocarditis), moderate to large pericardial effusion with no evidence for  tamponade.  Echo (12/16): EF 20%, LV thrombus, mild MR, PASP 66 mmHg, moderate pericardial effusion.  2. Pericardial effusion: Large on 4/16 echo.  Patient had pericardial window for diagnostic and therapeutic purposes.  Cytology negative for malignancy.  6/16 echo with recurrence of moderate-large pericardial effusion without tamponade, similar repeat echo in 7/16, effusion slightly smaller in 8/16 by echo and MRI.  Moderate effusion on 12/16 echo.  3. Atrial fibrillation: Atrial fibrillation with RVR, converted to NSR with amiodarone.   4. HTN 5. Noncompliance 6. LV thrombus: Noted on 12/16 echo.  7. MSSA bacteremia and discitis in 3/17, suspect tunneled catheter infection.   Current Outpatient Prescriptions  Medication Sig Dispense Refill  . amiodarone (PACERONE) 100 MG tablet Take 1 tablet (100 mg total) by mouth daily. 30 tablet 5  . milrinone (PRIMACOR) 20 MG/100ML SOLN infusion Inject 11.6 mcg/min into the vein continuous. 100 mL 0  . potassium chloride SA (K-DUR,KLOR-CON) 20 MEQ tablet Take 20 mEq by mouth daily.    Marland Kitchen torsemide (DEMADEX) 20 MG tablet Take 1 tablet (20 mg total) by mouth daily. 30 tablet 0  . digoxin 62.5 MCG TABS Take 0.0625 mg by mouth daily. 30 tablet 6  . Magnesium Oxide 200 MG TABS Take 1 tablet (200 mg total) by mouth 2 (two) times daily. 90 each 3  . methocarbamol (ROBAXIN) 500 MG tablet Take 500 mg by mouth 2 (two) times daily. In the afternoon and at bedtime    . oxyCODONE (OXY IR/ROXICODONE) 5 MG immediate release tablet Take 2 tablets (10 mg total) by mouth every 4 (four) hours as needed for moderate pain or severe pain. 30 tablet 0  . spironolactone (ALDACTONE) 25 MG tablet Take 0.5 tablets (12.5 mg total) by mouth daily. 15 tablet 6  . warfarin (COUMADIN) 2 MG tablet Take 2 mg by mouth at bedtime.     No current facility-administered medications for this encounter.    No Known Allergies    Filed Vitals:   07/20/15 1031  BP: 90/64  Pulse: 88    Weight: 95 lb 9.6 oz (43.364 kg)  SpO2: 100%   Wt Readings from Last 3 Encounters:  07/20/15 95 lb 9.6 oz (43.364 kg)  07/11/15 89 lb (40.37 kg)  06/28/15 89 lb (40.37 kg)     PHYSICAL EXAM: General: Elderly, chronically ill appearing.  Thin. HEENT: normal Neck: supple. JVP 7-8  cm.  Carotids 2+ bilat; no bruits. No lymphadenopathy or thryomegaly appreciated. Cor: PMI nondisplaced. Regular rate & rhythm. Paradoxical S2 split Lungs: Clear  Abdomen: soft, nontender, + distended. No hepatosplenomegaly. No bruits or masses. +BS Extremities: no cyanosis, clubbing, rash.  No edeam. RUE PICC.    Neuro: alert & oriented x 3, cranial nerves grossly intact. moves all 4 extremities w/o difficulty. Affect pleasant.    ASSESSMENT & PLAN: 1. Chronic systolic HF: Due to nonischemic cardiomyopathy.  cMRI (8/16) showed LV EF 11%, RV EF 30%, and patchy mid-wall LGE in the basal-mid septum that may suggest prior myocarditis.  She is now back on milrinone 0.25 via tunneled catheter with no problems.  Echo (12/16) with EF 20% and LV thrombus. NYHA class II-III symptoms. Able to walk in the clinic.  Seems to be doing better.  Volume status stable. Continue torsemide 20 mg daily. Ask [facility] to set up tele monitoring.  - Continue milrinone 0.25. - Continue current digoxin, check level lab draw. Will ask [facility] to check dig level.  - Continue spironolactone.  - No beta blocker with low output.  - She has advanced heart failure with high risk of morbidity/mortality over the next 6 months but she has great difficulty following cardiology recommendations. Have previously discussed LVAD but do not think she will be a good candidate with compliance issues.  She also has not been interested in having an LVAD. - Previous EKG LBBB-like IVCD, QRS 138 msec.  Unlikely that she would derive enough benefit from CRT to get off milrinone.  It is unlikely that she would agree to device placement. 2. PAF: Regular pulse.  - Continue  amio to 100 mg daily.Marland Kitchen She was told to get regular eye exams while on amiodarone.  - Continue warfarin. No bleeding problems.  3. Pericardial effusion: S/p pericardial window. Transudate, cytology negative.  Last ECHO 02/2015 EF 10-15% and stable moderate pericardial effusion => no tamponade.  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 4. LV thrombus: She is on warfarin. INR on 4/24 was 1.2  Followed at [facility].  5. S/p MSSA and Klebsiella bactermia with associated T11-T12 discitis. Suspect this was a tunneled catheter infection.  - on ceftriaxone per ID. Stop date TBD, recommended length of therapy was 6 weeks.  - Sees Dr Luciana Axe with ID on 5/2 6. HCAP: Was diagnosed with PNA at [facility] and started on zithromax on 3/21 in addition to ceftriaxone.    Follow up 4 weeks. Ask [facility] to set up tele-monitoring.   Tonye Becket, NP-C  07/20/2015

## 2015-07-20 NOTE — Patient Instructions (Signed)
Your physician recommends that you schedule a follow-up appointment in: 4 weeks  Do the following things EVERYDAY: 1) Weigh yourself in the morning before breakfast. Write it down and keep it in a log. 2) Take your medicines as prescribed 3) Eat low salt foods-Limit salt (sodium) to 2000 mg per day.  4) Stay as active as you can everyday 5) Limit all fluids for the day to less than 2 liters 6)   

## 2015-07-20 NOTE — Telephone Encounter (Signed)
Verbal order per Dr. Luciana Axe given to Debbie at [facility] to start back the rocephin for four more weeks. See Dr. Ephriam Knuckles note below.

## 2015-07-21 ENCOUNTER — Other Ambulatory Visit (HOSPITAL_COMMUNITY): Payer: Self-pay | Admitting: Cardiology

## 2015-07-21 ENCOUNTER — Telehealth: Payer: Self-pay | Admitting: *Deleted

## 2015-07-21 DIAGNOSIS — E440 Moderate protein-calorie malnutrition: Secondary | ICD-10-CM | POA: Diagnosis not present

## 2015-07-21 DIAGNOSIS — I110 Hypertensive heart disease with heart failure: Secondary | ICD-10-CM | POA: Diagnosis not present

## 2015-07-21 DIAGNOSIS — M4644 Discitis, unspecified, thoracic region: Secondary | ICD-10-CM | POA: Diagnosis not present

## 2015-07-21 DIAGNOSIS — I480 Paroxysmal atrial fibrillation: Secondary | ICD-10-CM | POA: Diagnosis not present

## 2015-07-21 DIAGNOSIS — I5043 Acute on chronic combined systolic (congestive) and diastolic (congestive) heart failure: Secondary | ICD-10-CM | POA: Diagnosis not present

## 2015-07-21 DIAGNOSIS — I429 Cardiomyopathy, unspecified: Secondary | ICD-10-CM | POA: Diagnosis not present

## 2015-07-21 NOTE — Telephone Encounter (Signed)
-----   Message from Gardiner Barefoot, MD sent at 07/21/2015  7:59 AM EDT ----- Yes, weekly cbc and bmp. thanks ----- Message -----    From: Macy Mis, CMA    Sent: 07/20/2015   5:10 PM      To: Gardiner Barefoot, MD  Verbal order given to [facility]. Do you want any labs. The cardiac MD has labs, BMP, etc. Ordered every two weeks which I've requested a copy for you; but I didn't know if you wanted any weekly labs. ----- Message -----    From: Gardiner Barefoot, MD    Sent: 07/20/2015   2:18 PM      To: [facility] Triage Nurse Pool  Could you let advanced know we need to treat her with rocephin 2 grams daily for another 4 weeks.  She apparently was off inadvertently through NH for the last 3 weeks.  She already hass appt with me next week. thanks

## 2015-07-21 NOTE — Telephone Encounter (Signed)
Verbal order per Dr. Luciana Axe given to [facility] at [facility] for weekly cbc and bmp. Results to be faxed MD. Katrina Peck

## 2015-07-22 DIAGNOSIS — I480 Paroxysmal atrial fibrillation: Secondary | ICD-10-CM | POA: Diagnosis not present

## 2015-07-22 DIAGNOSIS — I429 Cardiomyopathy, unspecified: Secondary | ICD-10-CM | POA: Diagnosis not present

## 2015-07-22 DIAGNOSIS — E440 Moderate protein-calorie malnutrition: Secondary | ICD-10-CM | POA: Diagnosis not present

## 2015-07-22 DIAGNOSIS — I110 Hypertensive heart disease with heart failure: Secondary | ICD-10-CM | POA: Diagnosis not present

## 2015-07-22 DIAGNOSIS — M4644 Discitis, unspecified, thoracic region: Secondary | ICD-10-CM | POA: Diagnosis not present

## 2015-07-22 DIAGNOSIS — I5043 Acute on chronic combined systolic (congestive) and diastolic (congestive) heart failure: Secondary | ICD-10-CM | POA: Diagnosis not present

## 2015-07-25 ENCOUNTER — Ambulatory Visit (INDEPENDENT_AMBULATORY_CARE_PROVIDER_SITE_OTHER): Payer: Medicare Other | Admitting: Internal Medicine

## 2015-07-25 DIAGNOSIS — I513 Intracardiac thrombosis, not elsewhere classified: Secondary | ICD-10-CM

## 2015-07-25 DIAGNOSIS — I213 ST elevation (STEMI) myocardial infarction of unspecified site: Secondary | ICD-10-CM

## 2015-07-25 DIAGNOSIS — E440 Moderate protein-calorie malnutrition: Secondary | ICD-10-CM | POA: Diagnosis not present

## 2015-07-25 DIAGNOSIS — Z5181 Encounter for therapeutic drug level monitoring: Secondary | ICD-10-CM

## 2015-07-25 DIAGNOSIS — I110 Hypertensive heart disease with heart failure: Secondary | ICD-10-CM | POA: Diagnosis not present

## 2015-07-25 DIAGNOSIS — I480 Paroxysmal atrial fibrillation: Secondary | ICD-10-CM

## 2015-07-25 DIAGNOSIS — I5043 Acute on chronic combined systolic (congestive) and diastolic (congestive) heart failure: Secondary | ICD-10-CM | POA: Diagnosis not present

## 2015-07-25 DIAGNOSIS — M4644 Discitis, unspecified, thoracic region: Secondary | ICD-10-CM | POA: Diagnosis not present

## 2015-07-25 DIAGNOSIS — Z7901 Long term (current) use of anticoagulants: Secondary | ICD-10-CM

## 2015-07-25 DIAGNOSIS — I429 Cardiomyopathy, unspecified: Secondary | ICD-10-CM | POA: Diagnosis not present

## 2015-07-25 LAB — POCT INR: INR: 2.3

## 2015-07-26 ENCOUNTER — Ambulatory Visit (INDEPENDENT_AMBULATORY_CARE_PROVIDER_SITE_OTHER): Payer: Medicare Other | Admitting: Internal Medicine

## 2015-07-26 ENCOUNTER — Telehealth: Payer: Self-pay

## 2015-07-26 ENCOUNTER — Encounter: Payer: Self-pay | Admitting: Internal Medicine

## 2015-07-26 VITALS — BP 120/74 | HR 77 | Temp 97.6°F | Wt 98.0 lb

## 2015-07-26 DIAGNOSIS — I480 Paroxysmal atrial fibrillation: Secondary | ICD-10-CM | POA: Diagnosis not present

## 2015-07-26 DIAGNOSIS — A4101 Sepsis due to Methicillin susceptible Staphylococcus aureus: Secondary | ICD-10-CM

## 2015-07-26 DIAGNOSIS — I429 Cardiomyopathy, unspecified: Secondary | ICD-10-CM | POA: Diagnosis not present

## 2015-07-26 DIAGNOSIS — T80219D Unspecified infection due to central venous catheter, subsequent encounter: Secondary | ICD-10-CM

## 2015-07-26 DIAGNOSIS — E440 Moderate protein-calorie malnutrition: Secondary | ICD-10-CM | POA: Diagnosis not present

## 2015-07-26 DIAGNOSIS — M4644 Discitis, unspecified, thoracic region: Secondary | ICD-10-CM | POA: Diagnosis not present

## 2015-07-26 DIAGNOSIS — I5043 Acute on chronic combined systolic (congestive) and diastolic (congestive) heart failure: Secondary | ICD-10-CM | POA: Diagnosis not present

## 2015-07-26 DIAGNOSIS — I110 Hypertensive heart disease with heart failure: Secondary | ICD-10-CM | POA: Diagnosis not present

## 2015-07-26 NOTE — Assessment & Plan Note (Signed)
TEE was ok.

## 2015-07-26 NOTE — Assessment & Plan Note (Signed)
Also with Klebsiella, repeat blood cultures were negative.

## 2015-07-26 NOTE — Assessment & Plan Note (Signed)
With some interruption she has had about 6 weeks of antibiotics.  She though continues to have pain.  MRI for her was difficult and not sure that she could do it again.  I am going to have her abx extended for 2 weeks through 5/16, check ESR and CRP with next labs with [facility], and if ok, stop.  If there are any concerns, I will have her come back and extend therapy, consider CT scan of spine.

## 2015-07-26 NOTE — Progress Notes (Signed)
   Subjective:    Patient ID: Katrina Peck, female    DOB: Dec 03, 1945, 70 y.o.   MRN: [id number]  HPI Here for follow up of hospitalization for thoracic discitis  She has a history of CHF/NICM/PAF on milrinone chronically who was admitted to [facility] [place] [facility] from 3/12-3/20 and noted leukocytosis, back pain and MRI of spine noted an area of T11-12 with edema concerning for discitis.  No epidural abscess or other concerns.  Her blood culture on 3/11 also grew out MSSA and Klebsiella and she was started on a regimen of ceftriaxone for 6 weeks through 4/24.  She went to rehab and was discharged about 2 weeks ago.  The home health nurse felt that she had been off of her antibiotics during her rehab stay since early April.   The discharge note from [facility] noted that she had been on Rocephin until discharge, though the medication list inadvertently listed the stop date as 4/04 instead of 4/24.  She though also missed 7 days of medication after discharge and has again been on it since about 4/24.  No issues, no rash, no diarrhea.  Pain about the same in the back.     Review of Systems  Constitutional: Negative for fever and fatigue.  Gastrointestinal: Negative for diarrhea.  Skin: Negative for rash.       Objective:   Physical Exam  Constitutional: No distress.  thin  Eyes: No scleral icterus.  Cardiovascular: Normal rate, regular rhythm and normal heart sounds.   Pulmonary/Chest: No respiratory distress.  Skin: No rash noted.          Assessment & Plan:

## 2015-07-26 NOTE — Telephone Encounter (Signed)
Called [facility] and let Miranda know Dr. Luciana Axe verbal order to add a sed rate and a CRP to weekly labs next week.  Explained that Dr. Luciana Axe end date for patient's antibiotics will be determined off of lab work. Rejeana Brock, LPN

## 2015-07-27 DIAGNOSIS — I429 Cardiomyopathy, unspecified: Secondary | ICD-10-CM | POA: Diagnosis not present

## 2015-07-27 DIAGNOSIS — I5043 Acute on chronic combined systolic (congestive) and diastolic (congestive) heart failure: Secondary | ICD-10-CM | POA: Diagnosis not present

## 2015-07-27 DIAGNOSIS — E440 Moderate protein-calorie malnutrition: Secondary | ICD-10-CM | POA: Diagnosis not present

## 2015-07-27 DIAGNOSIS — I110 Hypertensive heart disease with heart failure: Secondary | ICD-10-CM | POA: Diagnosis not present

## 2015-07-27 DIAGNOSIS — M4644 Discitis, unspecified, thoracic region: Secondary | ICD-10-CM | POA: Diagnosis not present

## 2015-07-27 DIAGNOSIS — I480 Paroxysmal atrial fibrillation: Secondary | ICD-10-CM | POA: Diagnosis not present

## 2015-07-28 ENCOUNTER — Encounter (HOSPITAL_COMMUNITY): Payer: Medicare Other

## 2015-07-28 DIAGNOSIS — I5043 Acute on chronic combined systolic (congestive) and diastolic (congestive) heart failure: Secondary | ICD-10-CM | POA: Diagnosis not present

## 2015-07-28 DIAGNOSIS — M4644 Discitis, unspecified, thoracic region: Secondary | ICD-10-CM | POA: Diagnosis not present

## 2015-07-28 DIAGNOSIS — I429 Cardiomyopathy, unspecified: Secondary | ICD-10-CM | POA: Diagnosis not present

## 2015-07-28 DIAGNOSIS — I480 Paroxysmal atrial fibrillation: Secondary | ICD-10-CM | POA: Diagnosis not present

## 2015-07-28 DIAGNOSIS — E440 Moderate protein-calorie malnutrition: Secondary | ICD-10-CM | POA: Diagnosis not present

## 2015-07-28 DIAGNOSIS — I110 Hypertensive heart disease with heart failure: Secondary | ICD-10-CM | POA: Diagnosis not present

## 2015-07-29 DIAGNOSIS — I5043 Acute on chronic combined systolic (congestive) and diastolic (congestive) heart failure: Secondary | ICD-10-CM | POA: Diagnosis not present

## 2015-07-29 DIAGNOSIS — I110 Hypertensive heart disease with heart failure: Secondary | ICD-10-CM | POA: Diagnosis not present

## 2015-07-29 DIAGNOSIS — I480 Paroxysmal atrial fibrillation: Secondary | ICD-10-CM | POA: Diagnosis not present

## 2015-07-29 DIAGNOSIS — M4644 Discitis, unspecified, thoracic region: Secondary | ICD-10-CM | POA: Diagnosis not present

## 2015-07-29 DIAGNOSIS — E440 Moderate protein-calorie malnutrition: Secondary | ICD-10-CM | POA: Diagnosis not present

## 2015-07-29 DIAGNOSIS — I429 Cardiomyopathy, unspecified: Secondary | ICD-10-CM | POA: Diagnosis not present

## 2015-08-01 ENCOUNTER — Ambulatory Visit (INDEPENDENT_AMBULATORY_CARE_PROVIDER_SITE_OTHER): Payer: Medicare Other | Admitting: Interventional Cardiology

## 2015-08-01 DIAGNOSIS — I110 Hypertensive heart disease with heart failure: Secondary | ICD-10-CM | POA: Diagnosis not present

## 2015-08-01 DIAGNOSIS — I513 Intracardiac thrombosis, not elsewhere classified: Secondary | ICD-10-CM

## 2015-08-01 DIAGNOSIS — Z5181 Encounter for therapeutic drug level monitoring: Secondary | ICD-10-CM

## 2015-08-01 DIAGNOSIS — I213 ST elevation (STEMI) myocardial infarction of unspecified site: Secondary | ICD-10-CM

## 2015-08-01 DIAGNOSIS — E440 Moderate protein-calorie malnutrition: Secondary | ICD-10-CM | POA: Diagnosis not present

## 2015-08-01 DIAGNOSIS — Z7901 Long term (current) use of anticoagulants: Secondary | ICD-10-CM

## 2015-08-01 DIAGNOSIS — I5043 Acute on chronic combined systolic (congestive) and diastolic (congestive) heart failure: Secondary | ICD-10-CM | POA: Diagnosis not present

## 2015-08-01 DIAGNOSIS — I480 Paroxysmal atrial fibrillation: Secondary | ICD-10-CM | POA: Diagnosis not present

## 2015-08-01 DIAGNOSIS — M4644 Discitis, unspecified, thoracic region: Secondary | ICD-10-CM | POA: Diagnosis not present

## 2015-08-01 DIAGNOSIS — I429 Cardiomyopathy, unspecified: Secondary | ICD-10-CM | POA: Diagnosis not present

## 2015-08-01 LAB — POCT INR: INR: 2.3

## 2015-08-02 ENCOUNTER — Telehealth (HOSPITAL_COMMUNITY): Payer: Self-pay

## 2015-08-02 ENCOUNTER — Other Ambulatory Visit: Payer: Self-pay

## 2015-08-02 ENCOUNTER — Telehealth: Payer: Self-pay

## 2015-08-02 DIAGNOSIS — E440 Moderate protein-calorie malnutrition: Secondary | ICD-10-CM | POA: Diagnosis not present

## 2015-08-02 DIAGNOSIS — T80219D Unspecified infection due to central venous catheter, subsequent encounter: Secondary | ICD-10-CM

## 2015-08-02 DIAGNOSIS — I110 Hypertensive heart disease with heart failure: Secondary | ICD-10-CM | POA: Diagnosis not present

## 2015-08-02 DIAGNOSIS — I480 Paroxysmal atrial fibrillation: Secondary | ICD-10-CM | POA: Diagnosis not present

## 2015-08-02 DIAGNOSIS — A4101 Sepsis due to Methicillin susceptible Staphylococcus aureus: Secondary | ICD-10-CM

## 2015-08-02 DIAGNOSIS — I5043 Acute on chronic combined systolic (congestive) and diastolic (congestive) heart failure: Secondary | ICD-10-CM | POA: Diagnosis not present

## 2015-08-02 DIAGNOSIS — I429 Cardiomyopathy, unspecified: Secondary | ICD-10-CM | POA: Diagnosis not present

## 2015-08-02 DIAGNOSIS — M4644 Discitis, unspecified, thoracic region: Secondary | ICD-10-CM | POA: Diagnosis not present

## 2015-08-02 NOTE — Telephone Encounter (Signed)
RN with [facility] called to report pt was slightly SOB and weight up 2-3 lbs. Patient does endorse she forgot to take her PM dose of diuretics last night. Advised to take medications as prescribed today and take extra attention to strict sodium and fluid restrictions today.  Patient to call us if weight continues to go up, SOB gets worse, or other s/s arise. Aware and agreeable to plan as stated above.  Ave Filter

## 2015-08-02 NOTE — Telephone Encounter (Signed)
[facility] RN called to report unable to get labs x 3 sticks with other RNs present. Patient due to BMET, CBC with diff, Sed rate, CRP, and mag per Dr. Luciana Axe (in reviewing patient's chart/OV notes). Will send message to LPN Candace to make aware.  Ave Filter

## 2015-08-02 NOTE — Telephone Encounter (Signed)
Called [facility] and spoke with Cassandra to confirm that [facility] was not able to get blood from patient yesterday. Cassandra stated [facility] nurse noted she tried three times with a venipuncture and was unsuccessful. [facility] nurse stated that patient stated she was coming to [facility] to get blood drawn today.  Called patient to inquire where she was getting blood drawn today at [facility]. Patient stated that she did not say that she had an appointment to get blood drawn but that because [facility] nurse could not get blood that she would go to [facility] to have it done. Patient was in physical therapy at the time of the call. Told patient that this nurse would call her back.   This nurse called [facility] supervisor, Rosalita Chessman and asked was there a nurse that could be sent out to collect the blood because patient did not have an apponitment today to have blood drawn. Rosalita Chessman stated that she could have a nurse out in a day or two. She also explained to this nurse that a PICC line draw could not be done due to patient receiving Milrinone infusion, but that she will look into getting special permission from the Cardiologist to draw off the PICC line as an option as well.   Called patient to let her know [facility] will be out to draw blood within a day or two. Patient stated that she did not want to be stuck anymore because it hurt and that "she has feelings" and "not an animal." This nurse called back and spoke with [facility] supervisor, Rosalita Chessman, and notified her that patient does not want to be stuck. Rosalita Chessman stated that she had called the cardiologist office and left two messages and was awaiting a response.  This nurse called patient back and let her know that this nurse relayed the message that she did not want to get stuck and [facility] had called her cardiologist and left two messages and are awaiting for permission. Patient then states that she did not refuse to be stuck and she was willing to allow one  stick and later stated two sticks as long as the person knows what they are doing. This nurse explained that she does have the right to refuse, if needed. Patient at the end of the stated that she is not going to refuse that she wants [facility] to do the PICC line but that she will allow a blood draw because they need the blood.

## 2015-08-04 DIAGNOSIS — I480 Paroxysmal atrial fibrillation: Secondary | ICD-10-CM | POA: Diagnosis not present

## 2015-08-04 DIAGNOSIS — Z5181 Encounter for therapeutic drug level monitoring: Secondary | ICD-10-CM | POA: Diagnosis not present

## 2015-08-04 DIAGNOSIS — I110 Hypertensive heart disease with heart failure: Secondary | ICD-10-CM | POA: Diagnosis not present

## 2015-08-04 DIAGNOSIS — I429 Cardiomyopathy, unspecified: Secondary | ICD-10-CM | POA: Diagnosis not present

## 2015-08-04 DIAGNOSIS — E440 Moderate protein-calorie malnutrition: Secondary | ICD-10-CM | POA: Diagnosis not present

## 2015-08-04 DIAGNOSIS — M4644 Discitis, unspecified, thoracic region: Secondary | ICD-10-CM | POA: Diagnosis not present

## 2015-08-04 DIAGNOSIS — I10 Essential (primary) hypertension: Secondary | ICD-10-CM | POA: Diagnosis not present

## 2015-08-04 DIAGNOSIS — I5043 Acute on chronic combined systolic (congestive) and diastolic (congestive) heart failure: Secondary | ICD-10-CM | POA: Diagnosis not present

## 2015-08-05 DIAGNOSIS — I480 Paroxysmal atrial fibrillation: Secondary | ICD-10-CM | POA: Diagnosis not present

## 2015-08-05 DIAGNOSIS — I110 Hypertensive heart disease with heart failure: Secondary | ICD-10-CM | POA: Diagnosis not present

## 2015-08-05 DIAGNOSIS — M4644 Discitis, unspecified, thoracic region: Secondary | ICD-10-CM | POA: Diagnosis not present

## 2015-08-05 DIAGNOSIS — I429 Cardiomyopathy, unspecified: Secondary | ICD-10-CM | POA: Diagnosis not present

## 2015-08-05 DIAGNOSIS — E440 Moderate protein-calorie malnutrition: Secondary | ICD-10-CM | POA: Diagnosis not present

## 2015-08-05 DIAGNOSIS — I5043 Acute on chronic combined systolic (congestive) and diastolic (congestive) heart failure: Secondary | ICD-10-CM | POA: Diagnosis not present

## 2015-08-08 ENCOUNTER — Ambulatory Visit (INDEPENDENT_AMBULATORY_CARE_PROVIDER_SITE_OTHER): Payer: Medicare Other | Admitting: Interventional Cardiology

## 2015-08-08 DIAGNOSIS — I5043 Acute on chronic combined systolic (congestive) and diastolic (congestive) heart failure: Secondary | ICD-10-CM | POA: Diagnosis not present

## 2015-08-08 DIAGNOSIS — E440 Moderate protein-calorie malnutrition: Secondary | ICD-10-CM | POA: Diagnosis not present

## 2015-08-08 DIAGNOSIS — Z5181 Encounter for therapeutic drug level monitoring: Secondary | ICD-10-CM

## 2015-08-08 DIAGNOSIS — I429 Cardiomyopathy, unspecified: Secondary | ICD-10-CM | POA: Diagnosis not present

## 2015-08-08 DIAGNOSIS — I480 Paroxysmal atrial fibrillation: Secondary | ICD-10-CM | POA: Diagnosis not present

## 2015-08-08 DIAGNOSIS — Z7901 Long term (current) use of anticoagulants: Secondary | ICD-10-CM

## 2015-08-08 DIAGNOSIS — M4644 Discitis, unspecified, thoracic region: Secondary | ICD-10-CM | POA: Diagnosis not present

## 2015-08-08 DIAGNOSIS — I513 Intracardiac thrombosis, not elsewhere classified: Secondary | ICD-10-CM

## 2015-08-08 DIAGNOSIS — I110 Hypertensive heart disease with heart failure: Secondary | ICD-10-CM | POA: Diagnosis not present

## 2015-08-08 DIAGNOSIS — I213 ST elevation (STEMI) myocardial infarction of unspecified site: Secondary | ICD-10-CM

## 2015-08-08 LAB — POCT INR: INR: 1.1

## 2015-08-09 DIAGNOSIS — I110 Hypertensive heart disease with heart failure: Secondary | ICD-10-CM | POA: Diagnosis not present

## 2015-08-09 DIAGNOSIS — E440 Moderate protein-calorie malnutrition: Secondary | ICD-10-CM | POA: Diagnosis not present

## 2015-08-09 DIAGNOSIS — I5043 Acute on chronic combined systolic (congestive) and diastolic (congestive) heart failure: Secondary | ICD-10-CM | POA: Diagnosis not present

## 2015-08-09 DIAGNOSIS — M4644 Discitis, unspecified, thoracic region: Secondary | ICD-10-CM | POA: Diagnosis not present

## 2015-08-09 DIAGNOSIS — I429 Cardiomyopathy, unspecified: Secondary | ICD-10-CM | POA: Diagnosis not present

## 2015-08-09 DIAGNOSIS — I480 Paroxysmal atrial fibrillation: Secondary | ICD-10-CM | POA: Diagnosis not present

## 2015-08-11 DIAGNOSIS — I480 Paroxysmal atrial fibrillation: Secondary | ICD-10-CM | POA: Diagnosis not present

## 2015-08-11 DIAGNOSIS — I429 Cardiomyopathy, unspecified: Secondary | ICD-10-CM | POA: Diagnosis not present

## 2015-08-11 DIAGNOSIS — E440 Moderate protein-calorie malnutrition: Secondary | ICD-10-CM | POA: Diagnosis not present

## 2015-08-11 DIAGNOSIS — Z5181 Encounter for therapeutic drug level monitoring: Secondary | ICD-10-CM | POA: Diagnosis not present

## 2015-08-11 DIAGNOSIS — Z452 Encounter for adjustment and management of vascular access device: Secondary | ICD-10-CM | POA: Diagnosis not present

## 2015-08-11 DIAGNOSIS — M4644 Discitis, unspecified, thoracic region: Secondary | ICD-10-CM | POA: Diagnosis not present

## 2015-08-11 DIAGNOSIS — I5043 Acute on chronic combined systolic (congestive) and diastolic (congestive) heart failure: Secondary | ICD-10-CM | POA: Diagnosis not present

## 2015-08-11 DIAGNOSIS — I110 Hypertensive heart disease with heart failure: Secondary | ICD-10-CM | POA: Diagnosis not present

## 2015-08-15 ENCOUNTER — Ambulatory Visit (INDEPENDENT_AMBULATORY_CARE_PROVIDER_SITE_OTHER): Payer: Medicare Other | Admitting: Cardiology

## 2015-08-15 ENCOUNTER — Other Ambulatory Visit (HOSPITAL_COMMUNITY): Payer: Self-pay | Admitting: Cardiology

## 2015-08-15 DIAGNOSIS — I110 Hypertensive heart disease with heart failure: Secondary | ICD-10-CM | POA: Diagnosis not present

## 2015-08-15 DIAGNOSIS — I213 ST elevation (STEMI) myocardial infarction of unspecified site: Secondary | ICD-10-CM

## 2015-08-15 DIAGNOSIS — I5043 Acute on chronic combined systolic (congestive) and diastolic (congestive) heart failure: Secondary | ICD-10-CM | POA: Diagnosis not present

## 2015-08-15 DIAGNOSIS — I480 Paroxysmal atrial fibrillation: Secondary | ICD-10-CM | POA: Diagnosis not present

## 2015-08-15 DIAGNOSIS — I429 Cardiomyopathy, unspecified: Secondary | ICD-10-CM | POA: Diagnosis not present

## 2015-08-15 DIAGNOSIS — Z5181 Encounter for therapeutic drug level monitoring: Secondary | ICD-10-CM

## 2015-08-15 DIAGNOSIS — I513 Intracardiac thrombosis, not elsewhere classified: Secondary | ICD-10-CM

## 2015-08-15 DIAGNOSIS — Z7901 Long term (current) use of anticoagulants: Secondary | ICD-10-CM

## 2015-08-15 DIAGNOSIS — E440 Moderate protein-calorie malnutrition: Secondary | ICD-10-CM | POA: Diagnosis not present

## 2015-08-15 DIAGNOSIS — M4644 Discitis, unspecified, thoracic region: Secondary | ICD-10-CM | POA: Diagnosis not present

## 2015-08-15 LAB — POCT INR: INR: 2

## 2015-08-17 ENCOUNTER — Ambulatory Visit (HOSPITAL_COMMUNITY)
Admission: RE | Admit: 2015-08-17 | Discharge: 2015-08-17 | Disposition: A | Discharge status: Home / Self Care | Payer: Medicare Other | Source: Ambulatory Visit | Attending: Cardiology | Admitting: Cardiology

## 2015-08-17 VITALS — BP 102/70 | Wt 97.4 lb

## 2015-08-17 DIAGNOSIS — J189 Pneumonia, unspecified organism: Secondary | ICD-10-CM | POA: Insufficient documentation

## 2015-08-17 DIAGNOSIS — I5022 Chronic systolic (congestive) heart failure: Secondary | ICD-10-CM | POA: Insufficient documentation

## 2015-08-17 DIAGNOSIS — I447 Left bundle-branch block, unspecified: Secondary | ICD-10-CM | POA: Diagnosis not present

## 2015-08-17 DIAGNOSIS — Z79899 Other long term (current) drug therapy: Secondary | ICD-10-CM | POA: Insufficient documentation

## 2015-08-17 DIAGNOSIS — A4101 Sepsis due to Methicillin susceptible Staphylococcus aureus: Secondary | ICD-10-CM

## 2015-08-17 DIAGNOSIS — Y95 Nosocomial condition: Secondary | ICD-10-CM | POA: Insufficient documentation

## 2015-08-17 DIAGNOSIS — I213 ST elevation (STEMI) myocardial infarction of unspecified site: Secondary | ICD-10-CM

## 2015-08-17 DIAGNOSIS — E440 Moderate protein-calorie malnutrition: Secondary | ICD-10-CM | POA: Diagnosis not present

## 2015-08-17 DIAGNOSIS — Z8619 Personal history of other infectious and parasitic diseases: Secondary | ICD-10-CM | POA: Diagnosis not present

## 2015-08-17 DIAGNOSIS — I480 Paroxysmal atrial fibrillation: Secondary | ICD-10-CM | POA: Insufficient documentation

## 2015-08-17 DIAGNOSIS — Z7901 Long term (current) use of anticoagulants: Secondary | ICD-10-CM | POA: Insufficient documentation

## 2015-08-17 DIAGNOSIS — I110 Hypertensive heart disease with heart failure: Secondary | ICD-10-CM | POA: Insufficient documentation

## 2015-08-17 DIAGNOSIS — I428 Other cardiomyopathies: Secondary | ICD-10-CM

## 2015-08-17 DIAGNOSIS — I429 Cardiomyopathy, unspecified: Secondary | ICD-10-CM | POA: Diagnosis not present

## 2015-08-17 DIAGNOSIS — M4644 Discitis, unspecified, thoracic region: Secondary | ICD-10-CM | POA: Diagnosis not present

## 2015-08-17 DIAGNOSIS — I313 Pericardial effusion (noninflammatory): Secondary | ICD-10-CM | POA: Insufficient documentation

## 2015-08-17 DIAGNOSIS — I5043 Acute on chronic combined systolic (congestive) and diastolic (congestive) heart failure: Secondary | ICD-10-CM | POA: Diagnosis not present

## 2015-08-17 DIAGNOSIS — I513 Intracardiac thrombosis, not elsewhere classified: Secondary | ICD-10-CM

## 2015-08-17 NOTE — Patient Instructions (Signed)
INCREASE Torsemide to 40 mg daily for 2 days then resume 20 mg daily  Your physician recommends that you schedule a follow-up appointment in: 2 weeks with Tonye Becket, NP  Do the following things EVERYDAY: 1) Weigh yourself in the morning before breakfast. Write it down and keep it in a log. 2) Take your medicines as prescribed 3) Eat low salt foods-Limit salt (sodium) to 2000 mg per day.  4) Stay as active as you can everyday 5) Limit all fluids for the day to less than 2 liters 6)

## 2015-08-17 NOTE — Progress Notes (Signed)
Patient ID: Katrina Peck, female   DOB: 1945-07-27, 70 y.o.   MRN: [id number]    Advanced Heart Failure Clinic Note   PCP: Dr. Duanne Guess Cardiology: Dr. Shirlee Latch  70 y/o female with history of HTN who was admitted 07/20/14 with 3 months of exertional dyspnea, weight gain, cough, and orthopnea. Found to have EF 20% and a large pericardial effusion. Cath 5/16 no CAD. Hospital course complicated by atrial fibrillation w/ RVR, converted to NSR on amiodarone. Underwent pericardial window (transudate).    She had a repeat echo in 6/16 that showed persistently low LV EF at 15-20% with recurrence of moderate to large pericardial effusion without tamponade. This showed EF 10-15%, mild LV dilation, restrictive diastolic function, moderate to severely decreased RV function, and a moderate to large pericardial effusion similar to 6/16.   Admitted in November with marked volume overload. Diuresed with IV lasix. Discharged on milrinone 0.125 mcg. BB and ACE stopped. Discharge weight was 92 pounds.   She had problems with her PICC line (numbness/pain in right arm/hand) and wanted the PICC line out.  She did not want placement of tunneled catheter.  Therefore, PICC was removed and milrinone stopped.  She re-developed symptomatic low output CHF and was re-admitted in 12/16.  She then had tunneled catheter placed and was restarted on milrinone and was diuresed.  She was discharged home back on milrinone.  Echo in 12/16 showed EF 20% with LV thrombus.  She was put on coumadin.   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 Tunneled cath was removed and milrinone given through peripheral IV for several days. She will complete a course of ceftriaxone x 6 weeks.  Tunneled cath replaced with repeat cultures negative for home inotropic support.    She returns for HF follow up. Since the last visit she saw Dr Luciana Axe with  recommendations to continue IV antibiotics for another 2 weeks. Overall feeling poorly. Complaining of back pain and cough. Having diarrhea 4-5 times a day.(dark brown) Denies dizziness. Denies fever or chills. Mild dyspnea with exertion. + Orthopnea. Weight at home 93-94 pounds.  Continues on home milrinone. [facility] following for home milrinone and labs. [facility] PT[facility] following.   Labs  5/16: K 4.6, creatinine 1.0 => 0.81, TSH normal, LFTs normal, TSH normal, BNP 1268 7/16: K 5.3 => 5.4, creatinine 1.0 => 1.02, HCT 39.7, BNP > 4500 => 1082, SPEP negative, urine IFE negative 8/16: K 5, creatinine 0.83 11/30/14: K 4.2 Creatinine 0.79  02/09/2015: K 4.5 Creatinine 1.11 12/16: digoxin < 0.2 1/17: K 3.8, creatinine 0.96, HCT 34.9 3/17: K 3.8, creatinine 0.77  SH: Married, no smoking, no ETOH.   FH: No history of cardiomyopathy or sudden death.    ROS: All systems reviewed and negative except as per HPI.   PMH: 1. Chronic systolic CHF: Nonischemic cardiomyopathy.  LHC/RHC (4/16) with no significant coronary disease; mean RA 5, PA 28/11, mean PCWP 9, CI 3.39.  Echo (4/16): EF 20% with diffuse hypokinesis, moderate RV systolic dysfunction, large pericardial effusion.  No history of drug or ETOH abuse.  No family history of cardiomyopathy.   Echo (6/16) with EF 15-20%, restrictive diastolic function, moderate MR, moderately decreased RV systolic function, PA systolic pressure 49 mmHg, moderate-large pericardial effusion without tamponade.  Echo (7/16) with EF 10-15%, mild LV dilation, diffuse hypokinesis, restrictive diastolic dysfunction, moderate MR, moderate to severely decreased RV systolic function, moderate TR, PA  systolic pressure 51 mmHg, moderate to large pericardial effusion, no change from 6/16.  Echo (8/16) with EF 10-15%, mild to moderately reduced RV systolic function, moderate pericardial effusion (slightly smaller than prior), PASP 63 mmHg.  Cardiac MRI (8/16) with LV EF 11%, RV EF 30%, mid-wall LGE  involving the basal to mid septum (?myocarditis), moderate to large pericardial effusion with no evidence for tamponade.  Echo (12/16): EF 20%, LV thrombus, mild MR, PASP 66 mmHg, moderate pericardial effusion.  2. Pericardial effusion: Large on 4/16 echo.  Patient had pericardial window for diagnostic and therapeutic purposes.  Cytology negative for malignancy.  6/16 echo with recurrence of moderate-large pericardial effusion without tamponade, similar repeat echo in 7/16, effusion slightly smaller in 8/16 by echo and MRI.  Moderate effusion on 12/16 echo.  3. Atrial fibrillation: Atrial fibrillation with RVR, converted to NSR with amiodarone.   4. HTN 5. Noncompliance 6. LV thrombus: Noted on 12/16 echo.  7. MSSA bacteremia and discitis in 3/17, suspect tunneled catheter infection.   Current Outpatient Prescriptions  Medication Sig Dispense Refill  . amiodarone (PACERONE) 100 MG tablet Take 1 tablet (100 mg total) by mouth daily. 30 tablet 5  . digoxin 62.5 MCG TABS Take 0.0625 mg by mouth daily. 30 tablet 6  . Magnesium Oxide 200 MG TABS Take 1 tablet (200 mg total) by mouth 2 (two) times daily. (Patient taking differently: Take 200 mg by mouth daily. ) 90 each 3  . methocarbamol (ROBAXIN) 500 MG tablet Take 500 mg by mouth 2 (two) times daily. In the afternoon and at bedtime    . milrinone (PRIMACOR) 20 MG/100ML SOLN infusion Inject 11.6 mcg/min into the vein continuous. 100 mL 0  . oxyCODONE (OXY IR/ROXICODONE) 5 MG immediate release tablet Take 2 tablets (10 mg total) by mouth every 4 (four) hours as needed for moderate pain or severe pain. 30 tablet 0  . potassium chloride SA (K-DUR,KLOR-CON) 20 MEQ tablet Take 20 mEq by mouth daily.    Marland Kitchen spironolactone (ALDACTONE) 25 MG tablet Take 0.5 tablets (12.5 mg total) by mouth daily. (Patient taking differently: Take 12.5 mg by mouth 2 (two) times a week. ) 15 tablet 6  . torsemide (DEMADEX) 20 MG tablet Take 1 tablet (20 mg total) by mouth daily.  30 tablet 0  . warfarin (COUMADIN) 2 MG tablet Take 2 mg by mouth as directed. 6 mg SunTuesThursSat and 4 mg all other days     No current facility-administered medications for this encounter.    No Known Allergies    Filed Vitals:   08/17/15 1207  BP: 102/70  Weight: 97 lb 6.4 oz (44.18 kg)   Wt Readings from Last 3 Encounters:  08/17/15 97 lb 6.4 oz (44.18 kg)  07/26/15 98 lb (44.453 kg)  07/20/15 95 lb 9.6 oz (43.364 kg)     PHYSICAL EXAM: General: Elderly, chronically ill appearing.  Thin.Husband present.  HEENT: normal Neck: supple. JVP 10-11  cm.  Carotids 2+ bilat; no bruits. No lymphadenopathy or thryomegaly appreciated. Cor: PMI nondisplaced. Regular rate & rhythm. Paradoxical S2 split Lungs: Clear  Abdomen: soft, nontender, + distended. No hepatosplenomegaly. No bruits or masses. +BS Extremities: no cyanosis, clubbing, rash.  R and LLE 1+ edema. RUE PICC.    Neuro: alert & oriented x 3, cranial nerves grossly intact. moves all 4 extremities w/o difficulty. Affect pleasant.    ASSESSMENT & PLAN: 1. Acute/Chronic systolic HF: Due to nonischemic cardiomyopathy.  cMRI (8/16) showed LV EF 11%, RV  EF 30%, and patchy mid-wall LGE in the basal-mid septum that may suggest prior myocarditis.  She is now back on milrinone 0.25 via tunneled catheter with no problems.  Echo (12/16) with EF 20% and LV thrombus. NYHA class III symptoms. .  Volume status elevated. Increase torsemide to 40 mg daily for the next 2 days then back to 20 mg daily.   - Continue milrinone 0.25 mcg. . - Continue current digoxin - Only taking 12.5 mg spiro once a week. I have asked her to take daily.   - No beta blocker with low output.  - She has advanced heart failure with high risk of morbidity/mortality over the next 6 months but she has great difficulty following cardiology recommendations. Have previously discussed LVAD but do not think she will be a good candidate with compliance issues.  She also has  not been interested in having an LVAD. - Previous EKG LBBB-like IVCD, QRS 138 msec.  Unlikely that she would derive enough benefit from CRT to get off milrinone.  It is unlikely that she would agree to device placement. 2. PAF: Regular pulse.  - Continue amio to 100 mg daily. She was told to get regular eye exams while on amiodarone.  - Continue warfarin. No bleeding problems.  3. Pericardial effusion: S/p pericardial window. Transudate, cytology negative.  Last ECHO 02/2015 EF 10-15% and stable moderate pericardial effusion => no tamponade.  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 4. LV thrombus: She is on warfarin. INR on 5/22 was 2.0   Followed at [facility].  5. S/p MSSA and Klebsiella bactermia with associated T11-T12 discitis. Suspect this was a tunneled catheter infection.  - on ceftriaxone per ID. Stop date TBD per Dr Luciana Axe.   -Having ongoing back pain. Sent Dr Luciana Axe a message. .  6. HCAP: Completed antiobiotics in march.     Follow up 2 weeks to reassess volume status.  She was instructed to go to [facility] if dyspnea gets worse.   Tonye Becket, NP-C  08/17/2015

## 2015-08-18 ENCOUNTER — Telehealth: Payer: Self-pay

## 2015-08-18 DIAGNOSIS — I429 Cardiomyopathy, unspecified: Secondary | ICD-10-CM | POA: Diagnosis not present

## 2015-08-18 DIAGNOSIS — I480 Paroxysmal atrial fibrillation: Secondary | ICD-10-CM | POA: Diagnosis not present

## 2015-08-18 DIAGNOSIS — I110 Hypertensive heart disease with heart failure: Secondary | ICD-10-CM | POA: Diagnosis not present

## 2015-08-18 DIAGNOSIS — M4644 Discitis, unspecified, thoracic region: Secondary | ICD-10-CM | POA: Diagnosis not present

## 2015-08-18 DIAGNOSIS — E440 Moderate protein-calorie malnutrition: Secondary | ICD-10-CM | POA: Diagnosis not present

## 2015-08-18 DIAGNOSIS — I5043 Acute on chronic combined systolic (congestive) and diastolic (congestive) heart failure: Secondary | ICD-10-CM | POA: Diagnosis not present

## 2015-08-18 DIAGNOSIS — Z5181 Encounter for therapeutic drug level monitoring: Secondary | ICD-10-CM | POA: Diagnosis not present

## 2015-08-18 NOTE — Telephone Encounter (Signed)
Called [facility] and spoke to [facility] to give Dr. Ephriam Knuckles order to stop antibiotic and pull the PICC line tomorrow (05/26).  Called patient to notify her Dr.Comer's order to discontinue her antibiotic and PICC line. Also, to follow up with primary care provider in reference to her back pain and a referral for a back specialist.No answer.  Left message for patient to call office. Rejeana Brock, LPN.

## 2015-08-19 ENCOUNTER — Telehealth (HOSPITAL_COMMUNITY): Payer: Self-pay | Admitting: *Deleted

## 2015-08-19 NOTE — Telephone Encounter (Signed)
Patient cardiology called and advised she will need to keep the PICC for medication they are giving her. They have called [facility] and just wanted to let us know.

## 2015-08-19 NOTE — Telephone Encounter (Signed)
[name], RN left a mess to let us know pt has a cold, stuffy nose w/clear drainage, and pt wants to know what she can take OTC for it.  Called and spoke w/pt, advised her ok to take Coricidin and/or Robitussin, she is agreeable

## 2015-08-22 DIAGNOSIS — I480 Paroxysmal atrial fibrillation: Secondary | ICD-10-CM | POA: Diagnosis not present

## 2015-08-22 DIAGNOSIS — I5043 Acute on chronic combined systolic (congestive) and diastolic (congestive) heart failure: Secondary | ICD-10-CM | POA: Diagnosis not present

## 2015-08-22 DIAGNOSIS — M4644 Discitis, unspecified, thoracic region: Secondary | ICD-10-CM | POA: Diagnosis not present

## 2015-08-22 DIAGNOSIS — I110 Hypertensive heart disease with heart failure: Secondary | ICD-10-CM | POA: Diagnosis not present

## 2015-08-22 DIAGNOSIS — E440 Moderate protein-calorie malnutrition: Secondary | ICD-10-CM | POA: Diagnosis not present

## 2015-08-22 DIAGNOSIS — I429 Cardiomyopathy, unspecified: Secondary | ICD-10-CM | POA: Diagnosis not present

## 2015-08-25 ENCOUNTER — Ambulatory Visit (INDEPENDENT_AMBULATORY_CARE_PROVIDER_SITE_OTHER): Payer: Medicare Other

## 2015-08-25 ENCOUNTER — Other Ambulatory Visit: Payer: Self-pay

## 2015-08-25 DIAGNOSIS — I213 ST elevation (STEMI) myocardial infarction of unspecified site: Secondary | ICD-10-CM

## 2015-08-25 DIAGNOSIS — I5043 Acute on chronic combined systolic (congestive) and diastolic (congestive) heart failure: Secondary | ICD-10-CM | POA: Diagnosis not present

## 2015-08-25 DIAGNOSIS — E440 Moderate protein-calorie malnutrition: Secondary | ICD-10-CM | POA: Diagnosis not present

## 2015-08-25 DIAGNOSIS — I429 Cardiomyopathy, unspecified: Secondary | ICD-10-CM | POA: Diagnosis not present

## 2015-08-25 DIAGNOSIS — M4644 Discitis, unspecified, thoracic region: Secondary | ICD-10-CM | POA: Diagnosis not present

## 2015-08-25 DIAGNOSIS — I480 Paroxysmal atrial fibrillation: Secondary | ICD-10-CM | POA: Diagnosis not present

## 2015-08-25 DIAGNOSIS — Z7901 Long term (current) use of anticoagulants: Secondary | ICD-10-CM

## 2015-08-25 DIAGNOSIS — I110 Hypertensive heart disease with heart failure: Secondary | ICD-10-CM | POA: Diagnosis not present

## 2015-08-25 DIAGNOSIS — I513 Intracardiac thrombosis, not elsewhere classified: Secondary | ICD-10-CM

## 2015-08-25 DIAGNOSIS — Z5181 Encounter for therapeutic drug level monitoring: Secondary | ICD-10-CM

## 2015-08-25 LAB — POCT INR: INR: 1.2

## 2015-08-25 MED ORDER — WARFARIN SODIUM 2 MG PO TABS: ORAL_TABLET | ORAL | Status: DC

## 2015-08-29 DIAGNOSIS — I110 Hypertensive heart disease with heart failure: Secondary | ICD-10-CM | POA: Diagnosis not present

## 2015-08-29 DIAGNOSIS — I5043 Acute on chronic combined systolic (congestive) and diastolic (congestive) heart failure: Secondary | ICD-10-CM | POA: Diagnosis not present

## 2015-08-29 DIAGNOSIS — E440 Moderate protein-calorie malnutrition: Secondary | ICD-10-CM | POA: Diagnosis not present

## 2015-08-29 DIAGNOSIS — I480 Paroxysmal atrial fibrillation: Secondary | ICD-10-CM | POA: Diagnosis not present

## 2015-08-29 DIAGNOSIS — I429 Cardiomyopathy, unspecified: Secondary | ICD-10-CM | POA: Diagnosis not present

## 2015-08-29 DIAGNOSIS — M4644 Discitis, unspecified, thoracic region: Secondary | ICD-10-CM | POA: Diagnosis not present

## 2015-08-30 ENCOUNTER — Encounter: Payer: Self-pay | Admitting: Internal Medicine

## 2015-08-31 ENCOUNTER — Encounter (HOSPITAL_COMMUNITY): Payer: Medicare Other

## 2015-09-02 ENCOUNTER — Ambulatory Visit (INDEPENDENT_AMBULATORY_CARE_PROVIDER_SITE_OTHER): Payer: Medicare Other

## 2015-09-02 DIAGNOSIS — Z5181 Encounter for therapeutic drug level monitoring: Secondary | ICD-10-CM

## 2015-09-02 DIAGNOSIS — I480 Paroxysmal atrial fibrillation: Secondary | ICD-10-CM | POA: Diagnosis not present

## 2015-09-02 DIAGNOSIS — Z7901 Long term (current) use of anticoagulants: Secondary | ICD-10-CM

## 2015-09-02 DIAGNOSIS — I513 Intracardiac thrombosis, not elsewhere classified: Secondary | ICD-10-CM

## 2015-09-02 DIAGNOSIS — I429 Cardiomyopathy, unspecified: Secondary | ICD-10-CM | POA: Diagnosis not present

## 2015-09-02 DIAGNOSIS — I110 Hypertensive heart disease with heart failure: Secondary | ICD-10-CM | POA: Diagnosis not present

## 2015-09-02 DIAGNOSIS — I213 ST elevation (STEMI) myocardial infarction of unspecified site: Secondary | ICD-10-CM

## 2015-09-02 DIAGNOSIS — M4644 Discitis, unspecified, thoracic region: Secondary | ICD-10-CM | POA: Diagnosis not present

## 2015-09-02 DIAGNOSIS — E440 Moderate protein-calorie malnutrition: Secondary | ICD-10-CM | POA: Diagnosis not present

## 2015-09-02 DIAGNOSIS — I5043 Acute on chronic combined systolic (congestive) and diastolic (congestive) heart failure: Secondary | ICD-10-CM | POA: Diagnosis not present

## 2015-09-02 LAB — POCT INR: INR: 1.5

## 2015-09-05 ENCOUNTER — Telehealth (HOSPITAL_COMMUNITY): Payer: Self-pay | Admitting: Vascular Surgery

## 2015-09-05 ENCOUNTER — Encounter (HOSPITAL_COMMUNITY): Payer: Self-pay

## 2015-09-05 DIAGNOSIS — M4644 Discitis, unspecified, thoracic region: Secondary | ICD-10-CM | POA: Diagnosis not present

## 2015-09-05 DIAGNOSIS — I429 Cardiomyopathy, unspecified: Secondary | ICD-10-CM | POA: Diagnosis not present

## 2015-09-05 DIAGNOSIS — I5043 Acute on chronic combined systolic (congestive) and diastolic (congestive) heart failure: Secondary | ICD-10-CM | POA: Diagnosis not present

## 2015-09-05 DIAGNOSIS — I480 Paroxysmal atrial fibrillation: Secondary | ICD-10-CM | POA: Diagnosis not present

## 2015-09-05 DIAGNOSIS — I110 Hypertensive heart disease with heart failure: Secondary | ICD-10-CM | POA: Diagnosis not present

## 2015-09-05 DIAGNOSIS — E440 Moderate protein-calorie malnutrition: Secondary | ICD-10-CM | POA: Diagnosis not present

## 2015-09-05 NOTE — Telephone Encounter (Signed)
[facility] called pt has a temp 99.8, bp in rt arm 78/38 lt arm 80/36, pt has increased SOB lungs are clear, Diarrhea weight is stable

## 2015-09-05 NOTE — Telephone Encounter (Signed)
Called patient directly to follow up on abnormal vitals and s/s from reports via family member from email this morning and call from [facility] RN. Patient sounds in good spirits on the phone, states she is feeling much better since the RN came to home.  Reported having some chills and feeling cold over the weekend but has now subsided. Is no longer c/o SOB.  Denies swelling, CP, or any other s/s.  Advised if this continues again to report to ED or come to CHF clinic earlier than apt time at 2 tomorrow. Aware and agreeable to plan as stated above.  Ave Filter

## 2015-09-06 ENCOUNTER — Ambulatory Visit (HOSPITAL_BASED_OUTPATIENT_CLINIC_OR_DEPARTMENT_OTHER)
Admission: RE | Admit: 2015-09-06 | Discharge: 2015-09-06 | Disposition: A | Discharge status: Home / Self Care | Payer: Medicare Other | Source: Ambulatory Visit | Attending: Internal Medicine | Admitting: Internal Medicine

## 2015-09-06 VITALS — BP 98/72 | HR 80 | Wt 100.2 lb

## 2015-09-06 DIAGNOSIS — I480 Paroxysmal atrial fibrillation: Secondary | ICD-10-CM | POA: Insufficient documentation

## 2015-09-06 DIAGNOSIS — Z79899 Other long term (current) drug therapy: Secondary | ICD-10-CM | POA: Insufficient documentation

## 2015-09-06 DIAGNOSIS — R7881 Bacteremia: Secondary | ICD-10-CM | POA: Diagnosis not present

## 2015-09-06 DIAGNOSIS — I313 Pericardial effusion (noninflammatory): Secondary | ICD-10-CM

## 2015-09-06 DIAGNOSIS — I5023 Acute on chronic systolic (congestive) heart failure: Secondary | ICD-10-CM | POA: Insufficient documentation

## 2015-09-06 DIAGNOSIS — Z8701 Personal history of pneumonia (recurrent): Secondary | ICD-10-CM

## 2015-09-06 DIAGNOSIS — I110 Hypertensive heart disease with heart failure: Secondary | ICD-10-CM | POA: Diagnosis not present

## 2015-09-06 DIAGNOSIS — Z9114 Patient's other noncompliance with medication regimen: Secondary | ICD-10-CM | POA: Diagnosis not present

## 2015-09-06 DIAGNOSIS — Z8619 Personal history of other infectious and parasitic diseases: Secondary | ICD-10-CM

## 2015-09-06 DIAGNOSIS — Z86718 Personal history of other venous thrombosis and embolism: Secondary | ICD-10-CM

## 2015-09-06 DIAGNOSIS — R509 Fever, unspecified: Secondary | ICD-10-CM | POA: Insufficient documentation

## 2015-09-06 DIAGNOSIS — I428 Other cardiomyopathies: Secondary | ICD-10-CM | POA: Insufficient documentation

## 2015-09-06 DIAGNOSIS — I5041 Acute combined systolic (congestive) and diastolic (congestive) heart failure: Secondary | ICD-10-CM | POA: Diagnosis not present

## 2015-09-06 DIAGNOSIS — I429 Cardiomyopathy, unspecified: Secondary | ICD-10-CM | POA: Diagnosis not present

## 2015-09-06 DIAGNOSIS — Z452 Encounter for adjustment and management of vascular access device: Secondary | ICD-10-CM | POA: Diagnosis not present

## 2015-09-06 DIAGNOSIS — T80211A Bloodstream infection due to central venous catheter, initial encounter: Secondary | ICD-10-CM | POA: Diagnosis not present

## 2015-09-06 DIAGNOSIS — I472 Ventricular tachycardia: Secondary | ICD-10-CM | POA: Diagnosis not present

## 2015-09-06 DIAGNOSIS — I5022 Chronic systolic (congestive) heart failure: Secondary | ICD-10-CM | POA: Diagnosis not present

## 2015-09-06 MED ORDER — SPIRONOLACTONE 25 MG PO TABS: 12.5000 mg | ORAL_TABLET | Freq: Every day | ORAL | Status: DC

## 2015-09-06 NOTE — Patient Instructions (Signed)
INCREASE Spironolactone to 12.5 mg, one half tab daily  [facility] will be to your home to get lab today  Your physician recommends that you schedule a follow-up appointment in: 4 weeks  Do the following things EVERYDAY: 1) Weigh yourself in the morning before breakfast. Write it down and keep it in a log. 2) Take your medicines as prescribed 3) Eat low salt foods-Limit salt (sodium) to 2000 mg per day.  4) Stay as active as you can everyday 5) Limit all fluids for the day to less than 2 liters 6)

## 2015-09-06 NOTE — Progress Notes (Signed)
Advanced Heart Failure Medication Review by a Pharmacist  Does the patient  feel that his/her medications are working for him/her?  yes  Has the patient been experiencing any side effects to the medications prescribed?  no  Does the patient measure his/her own blood pressure or blood glucose at home?  yes   Does the patient have any problems obtaining medications due to transportation or finances?   no  Understanding of regimen: fair Understanding of indications: fair Potential of compliance: fair Patient understands to avoid NSAIDs. Patient understands to avoid decongestants.  Issues to address at subsequent visits: Compliance   Pharmacist comments:  Katrina Peck is a 70 yo F presenting with her husband and without a medication list. She reports good compliance with her medications but was unsure of her digoxin dose. Have called [facility] who states she has not filled it since November (30 day supply) and [facility] who states she has never had a digoxin Rx filled there. She states she does not use any other pharmacy. She also admitted to only taking her torsemide QOD instead of daily since she states that she still feels the effects the day after taking a dose. She did not have any other medication-related questions or concerns for me at this time.   Tyler Deis. Bonnye Fava, PharmD, BCPS, CPP Clinical Pharmacist Pager: [phone] Phone: [phone] 09/06/2015 2:34 PM    Time with patient: 10 minutes Preparation and documentation time: 8 minutes Total time: 18 minutes

## 2015-09-06 NOTE — Progress Notes (Signed)
Patient ID: Katrina Peck, female   DOB: 15-Apr-1945, 70 y.o.   MRN: [id number]    Advanced Heart Failure Clinic Note   PCP: Dr. Duanne Guess Cardiology: Dr. Shirlee Latch  70 y/o female with history of HTN who was admitted 07/20/14 with 3 months of exertional dyspnea, weight gain, cough, and orthopnea. Found to have EF 20% and a large pericardial effusion. Cath 5/16 no CAD. Hospital course complicated by atrial fibrillation w/ RVR, converted to NSR on amiodarone. Underwent pericardial window (transudate).    She had a repeat echo in 6/16 that showed persistently low LV EF at 15-20% with recurrence of moderate to large pericardial effusion without tamponade. This showed EF 10-15%, mild LV dilation, restrictive diastolic function, moderate to severely decreased RV function, and a moderate to large pericardial effusion similar to 6/16.   Admitted in November with marked volume overload. Diuresed with IV lasix. Discharged on milrinone 0.125 mcg. BB and ACE stopped. Discharge weight was 92 pounds.   She had problems with her PICC line (numbness/pain in right arm/hand) and wanted the PICC line out.  She did not want placement of tunneled catheter.  Therefore, PICC was removed and milrinone stopped.  She re-developed symptomatic low output CHF and was re-admitted in 12/16.  She then had tunneled catheter placed and was restarted on milrinone and was diuresed.  She was discharged home back on milrinone.  Echo in 12/16 showed EF 20% with LV thrombus.  She was put on coumadin.   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 Tunneled cath was removed and milrinone given through peripheral IV for several days. She will complete a course of ceftriaxone x 6 weeks.  Tunneled cath replaced with repeat cultures negative for home inotropic support.    She returns for HF follow up. Overall feels "pretty good", but has good and bad  days. Up 3 lbs from last visit.  Gets SOB with long conversations or not even half a mile before needing to stop.  She did temp of 99.8 with chills yesterday.  Did not have any subjective fever. Back still bothers her, but has good and bad days. Weight at home 98 this morning. Continues on home milrinone, but she hates carrying it around. [facility] following for home milrinone and labs. [facility] PT/OT following.   Labs  5/16: K 4.6, creatinine 1.0 => 0.81, TSH normal, LFTs normal, TSH normal, BNP 1268 7/16: K 5.3 => 5.4, creatinine 1.0 => 1.02, HCT 39.7, BNP > 4500 => 1082, SPEP negative, urine IFE negative 8/16: K 5, creatinine 0.83 11/30/14: K 4.2 Creatinine 0.79  02/09/2015: K 4.5 Creatinine 1.11 12/16: digoxin < 0.2 1/17: K 3.8, creatinine 0.96, HCT 34.9 3/17: K 3.8, creatinine 0.77  Labs 09/02/15   SH: Married, no smoking, no ETOH.   FH: No history of cardiomyopathy or sudden death.    ROS: All systems reviewed and negative except as per HPI.   PMH: 1. Chronic systolic CHF: Nonischemic cardiomyopathy.  LHC/RHC (4/16) with no significant coronary disease; mean RA 5, PA 28/11, mean PCWP 9, CI 3.39.  Echo (4/16): EF 20% with diffuse hypokinesis, moderate RV systolic dysfunction, large pericardial effusion.  No history of drug or ETOH abuse.  No family history of cardiomyopathy.   Echo (6/16) with EF 15-20%, restrictive diastolic function, moderate MR, moderately decreased RV systolic function, PA systolic pressure 49 mmHg, moderate-large pericardial effusion without tamponade.  Echo (7/16) with  EF 10-15%, mild LV dilation, diffuse hypokinesis, restrictive diastolic dysfunction, moderate MR, moderate to severely decreased RV systolic function, moderate TR, PA systolic pressure 51 mmHg, moderate to large pericardial effusion, no change from 6/16.  Echo (8/16) with EF 10-15%, mild to moderately reduced RV systolic function, moderate pericardial effusion (slightly smaller than prior), PASP 63 mmHg.  Cardiac MRI  (8/16) with LV EF 11%, RV EF 30%, mid-wall LGE involving the basal to mid septum (?myocarditis), moderate to large pericardial effusion with no evidence for tamponade.  Echo (12/16): EF 20%, LV thrombus, mild MR, PASP 66 mmHg, moderate pericardial effusion.  2. Pericardial effusion: Large on 4/16 echo.  Patient had pericardial window for diagnostic and therapeutic purposes.  Cytology negative for malignancy.  6/16 echo with recurrence of moderate-large pericardial effusion without tamponade, similar repeat echo in 7/16, effusion slightly smaller in 8/16 by echo and MRI.  Moderate effusion on 12/16 echo.  3. Atrial fibrillation: Atrial fibrillation with RVR, converted to NSR with amiodarone.   4. HTN 5. Noncompliance 6. LV thrombus: Noted on 12/16 echo.  7. MSSA bacteremia and discitis in 3/17, suspect tunneled catheter infection.   Current Outpatient Prescriptions  Medication Sig Dispense Refill  . amiodarone (PACERONE) 100 MG tablet Take 1 tablet (100 mg total) by mouth daily. 30 tablet 5  . magnesium oxide (MAG-OX) 400 MG tablet Take 200 mg by mouth daily as needed (leg cramping).    . methocarbamol (ROBAXIN) 500 MG tablet Take 500 mg by mouth daily.     . milrinone (PRIMACOR) 20 MG/100ML SOLN infusion Inject 11.6 mcg/min into the vein continuous. 100 mL 0  . oxyCODONE (OXY IR/ROXICODONE) 5 MG immediate release tablet Take 2 tablets (10 mg total) by mouth every 4 (four) hours as needed for moderate pain or severe pain. 30 tablet 0  . potassium chloride SA (K-DUR,KLOR-CON) 20 MEQ tablet Take 20 mEq by mouth daily.    Marland Kitchen spironolactone (ALDACTONE) 25 MG tablet Take 12.5 mg by mouth 2 (two) times a week.    . torsemide (DEMADEX) 20 MG tablet Take 20 mg by mouth every other day.    . warfarin (COUMADIN) 2 MG tablet Take as directed by [facility] 80 tablet 2   No current facility-administered medications for this encounter.   No Known Allergies  Filed Vitals:   09/06/15 1407  BP: 98/72    Pulse: 80  Weight: 100 lb 3.2 oz (45.45 kg)  SpO2: 99%   Wt Readings from Last 3 Encounters:  09/06/15 100 lb 3.2 oz (45.45 kg)  08/17/15 97 lb 6.4 oz (44.18 kg)  07/26/15 98 lb (44.453 kg)     PHYSICAL EXAM: General: Elderly, chronically ill appearing.  Thin.Husband present.  HEENT: normal Neck: supple. JVP 9-10  cm.  Carotids 2+ bilat; no bruits. No thyromegaly or nodule noted.  Cor: PMI nondisplaced. Regular rate & rhythm. Paradoxical S2 split Lungs: CTAB, normal effort.  Abdomen: soft, NT, mildly distended. No hepatosplenomegaly. No bruits or masses. +BS Extremities: no cyanosis, clubbing, rash.  R and LLE 1+ edema. RUE PICC.    Neuro: alert & oriented x 3, cranial nerves grossly intact. moves all 4 extremities w/o difficulty. Affect pleasant.  ASSESSMENT & PLAN: 1. Acute/Chronic systolic HF: Due to nonischemic cardiomyopathy.  cMRI (8/16) showed LV EF 11%, RV EF 30%, and patchy mid-wall LGE in the basal-mid septum that may suggest prior myocarditis.  She is now back on milrinone 0.25 via tunneled catheter with no problems.  Echo (12/16) with  EF 20% and LV thrombus. NYHA class III symptoms. .  Volume status elevated.  - Taking torsemide 20 every other day, refuses to increase to 20 mg daily. Will take an extra 20 mg as needed. Told her to take extra tomorrow if weight not down.  - Continue milrinone 0.25 mcg. - She has not refilled digoxin since 01/2015 per pharmacy. Will not place back on at this time.  - Increase 12.5 mg spiro to daily, has only been taking twice a week.    - No beta blocker with low output.  - She has advanced heart failure with high risk of morbidity/mortality over the next 6 months but she has great difficulty following cardiology recommendations. Have previously discussed LVAD but do not think she will be a good candidate with compliance issues.  She also has not been interested in having an LVAD. - Previous EKG LBBB-like IVCD, QRS 138 msec.  Unlikely that  she would derive enough benefit from CRT to get off milrinone.  It is unlikely that she would agree to device placement. 2. PAF: Regular pulse.  - Continue amio to 100 mg daily. She was told to get regular eye exams while on amiodarone.  - Continue warfarin. No bleeding problems.  3. Pericardial effusion: S/p pericardial window. Transudate, cytology negative.  Last ECHO 02/2015 EF 10-15% and stable moderate pericardial effusion => no tamponade.  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 4. LV thrombus: She is on warfarin. INR on 5/22 was 2.0   Followed at [facility].  5. S/p MSSA and Klebsiella bactermia with associated T11-T12 discitis. Suspect this was a tunneled catheter infection.  - Finished ceftriaxone about a month ago per Dr Luciana Axe.    6. HCAP: Completed antiobiotics in march.    7. Low grade fever/chills - Temp 99.8 yesterday.  Needs Blood Cx x 2, BMET, CBC  STAT per [facility]. Will try and get tonight.   Increase spiro as above. Should take extra torsemide if weight doesn't come down.  Follow up in 4 weeks or sooner based on Blood Cx.   Graciella Freer, PA-C 09/06/2015

## 2015-09-07 DIAGNOSIS — I110 Hypertensive heart disease with heart failure: Secondary | ICD-10-CM | POA: Diagnosis not present

## 2015-09-07 DIAGNOSIS — E440 Moderate protein-calorie malnutrition: Secondary | ICD-10-CM | POA: Diagnosis not present

## 2015-09-07 DIAGNOSIS — I5043 Acute on chronic combined systolic (congestive) and diastolic (congestive) heart failure: Secondary | ICD-10-CM | POA: Diagnosis not present

## 2015-09-07 DIAGNOSIS — I480 Paroxysmal atrial fibrillation: Secondary | ICD-10-CM | POA: Diagnosis not present

## 2015-09-07 DIAGNOSIS — M4644 Discitis, unspecified, thoracic region: Secondary | ICD-10-CM | POA: Diagnosis not present

## 2015-09-07 DIAGNOSIS — I429 Cardiomyopathy, unspecified: Secondary | ICD-10-CM | POA: Diagnosis not present

## 2015-09-08 ENCOUNTER — Telehealth (HOSPITAL_COMMUNITY): Payer: Self-pay

## 2015-09-08 ENCOUNTER — Inpatient Hospital Stay (HOSPITAL_COMMUNITY)
Admission: EM | Admit: 2015-09-08 | Discharge: 2015-09-16 | DRG: 315 | Disposition: A | Discharge status: Home Health | Payer: Medicare Other | Attending: Internal Medicine | Admitting: Internal Medicine

## 2015-09-08 ENCOUNTER — Other Ambulatory Visit (HOSPITAL_COMMUNITY): Payer: Self-pay | Admitting: Cardiology

## 2015-09-08 ENCOUNTER — Encounter (HOSPITAL_COMMUNITY): Payer: Self-pay | Admitting: Neurology

## 2015-09-08 DIAGNOSIS — B9689 Other specified bacterial agents as the cause of diseases classified elsewhere: Secondary | ICD-10-CM | POA: Diagnosis present

## 2015-09-08 DIAGNOSIS — I3139 Other pericardial effusion (noninflammatory): Secondary | ICD-10-CM | POA: Diagnosis present

## 2015-09-08 DIAGNOSIS — I447 Left bundle-branch block, unspecified: Secondary | ICD-10-CM | POA: Diagnosis present

## 2015-09-08 DIAGNOSIS — I429 Cardiomyopathy, unspecified: Secondary | ICD-10-CM | POA: Diagnosis present

## 2015-09-08 DIAGNOSIS — Z79899 Other long term (current) drug therapy: Secondary | ICD-10-CM

## 2015-09-08 DIAGNOSIS — I472 Ventricular tachycardia: Secondary | ICD-10-CM | POA: Diagnosis present

## 2015-09-08 DIAGNOSIS — T80211A Bloodstream infection due to central venous catheter, initial encounter: Principal | ICD-10-CM | POA: Diagnosis present

## 2015-09-08 DIAGNOSIS — G8929 Other chronic pain: Secondary | ICD-10-CM | POA: Diagnosis present

## 2015-09-08 DIAGNOSIS — A499 Bacterial infection, unspecified: Secondary | ICD-10-CM | POA: Insufficient documentation

## 2015-09-08 DIAGNOSIS — Z833 Family history of diabetes mellitus: Secondary | ICD-10-CM

## 2015-09-08 DIAGNOSIS — Z452 Encounter for adjustment and management of vascular access device: Secondary | ICD-10-CM | POA: Diagnosis not present

## 2015-09-08 DIAGNOSIS — R197 Diarrhea, unspecified: Secondary | ICD-10-CM | POA: Diagnosis present

## 2015-09-08 DIAGNOSIS — I110 Hypertensive heart disease with heart failure: Secondary | ICD-10-CM | POA: Diagnosis not present

## 2015-09-08 DIAGNOSIS — I428 Other cardiomyopathies: Secondary | ICD-10-CM

## 2015-09-08 DIAGNOSIS — M549 Dorsalgia, unspecified: Secondary | ICD-10-CM | POA: Diagnosis present

## 2015-09-08 DIAGNOSIS — R7881 Bacteremia: Secondary | ICD-10-CM | POA: Diagnosis not present

## 2015-09-08 DIAGNOSIS — I5022 Chronic systolic (congestive) heart failure: Secondary | ICD-10-CM | POA: Diagnosis present

## 2015-09-08 DIAGNOSIS — Y848 Other medical procedures as the cause of abnormal reaction of the patient, or of later complication, without mention of misadventure at the time of the procedure: Secondary | ICD-10-CM | POA: Diagnosis present

## 2015-09-08 DIAGNOSIS — R0602 Shortness of breath: Secondary | ICD-10-CM

## 2015-09-08 DIAGNOSIS — I480 Paroxysmal atrial fibrillation: Secondary | ICD-10-CM

## 2015-09-08 DIAGNOSIS — I509 Heart failure, unspecified: Secondary | ICD-10-CM

## 2015-09-08 DIAGNOSIS — R509 Fever, unspecified: Secondary | ICD-10-CM | POA: Insufficient documentation

## 2015-09-08 DIAGNOSIS — Z9119 Patient's noncompliance with other medical treatment and regimen: Secondary | ICD-10-CM

## 2015-09-08 DIAGNOSIS — I313 Pericardial effusion (noninflammatory): Secondary | ICD-10-CM | POA: Diagnosis present

## 2015-09-08 DIAGNOSIS — Z8249 Family history of ischemic heart disease and other diseases of the circulatory system: Secondary | ICD-10-CM

## 2015-09-08 DIAGNOSIS — Z681 Body mass index (BMI) 19 or less, adult: Secondary | ICD-10-CM

## 2015-09-08 DIAGNOSIS — Z79891 Long term (current) use of opiate analgesic: Secondary | ICD-10-CM

## 2015-09-08 DIAGNOSIS — Z7901 Long term (current) use of anticoagulants: Secondary | ICD-10-CM

## 2015-09-08 DIAGNOSIS — I5023 Acute on chronic systolic (congestive) heart failure: Secondary | ICD-10-CM | POA: Diagnosis present

## 2015-09-08 DIAGNOSIS — I513 Intracardiac thrombosis, not elsewhere classified: Secondary | ICD-10-CM | POA: Diagnosis present

## 2015-09-08 DIAGNOSIS — Z8619 Personal history of other infectious and parasitic diseases: Secondary | ICD-10-CM

## 2015-09-08 DIAGNOSIS — T80219A Unspecified infection due to central venous catheter, initial encounter: Secondary | ICD-10-CM | POA: Insufficient documentation

## 2015-09-08 DIAGNOSIS — M542 Cervicalgia: Secondary | ICD-10-CM | POA: Diagnosis present

## 2015-09-08 HISTORY — DX: Bacteremia: R78.81

## 2015-09-08 HISTORY — DX: Patient's noncompliance with other medical treatment and regimen due to unspecified reason: Z91.199

## 2015-09-08 HISTORY — DX: Intracardiac thrombosis, not elsewhere classified: I51.3

## 2015-09-08 HISTORY — DX: Paroxysmal atrial fibrillation: I48.0

## 2015-09-08 HISTORY — DX: Patient's noncompliance with other medical treatment and regimen: Z91.19

## 2015-09-08 HISTORY — DX: Discitis, unspecified, site unspecified: M46.40

## 2015-09-08 LAB — BASIC METABOLIC PANEL
Anion gap: 8 (ref 5–15)
BUN: 9 mg/dL (ref 6–20)
CALCIUM: 9.4 mg/dL (ref 8.9–10.3)
CO2: 25 mmol/L (ref 22–32)
CREATININE: 0.86 mg/dL (ref 0.44–1.00)
Chloride: 104 mmol/L (ref 101–111)
GFR calc non Af Amer: >60 mL/min (ref >60)
Glucose, Bld: 91 mg/dL (ref 65–99)
Potassium: 3.9 mmol/L (ref 3.5–5.1)
Sodium: 137 mmol/L (ref 135–145)

## 2015-09-08 LAB — I-STAT TROPONIN, ED: TROPONIN I, POC: 0.03 ng/mL (ref 0.00–0.08)

## 2015-09-08 LAB — PROTIME-INR
INR: 1.58 — ABNORMAL HIGH (ref 0.00–1.49)
Prothrombin Time: 18.9 seconds — ABNORMAL HIGH (ref 11.6–15.2)

## 2015-09-08 LAB — CBC
HCT: 32.8 % — ABNORMAL LOW (ref 36.0–46.0)
Hemoglobin: 10.2 g/dL — ABNORMAL LOW (ref 12.0–15.0)
MCH: 25.8 pg — AB (ref 26.0–34.0)
MCHC: 31.1 g/dL (ref 30.0–36.0)
MCV: 82.8 fL (ref 78.0–100.0)
PLATELETS: 198 10*3/uL (ref 150–400)
RBC: 3.96 MIL/uL (ref 3.87–5.11)
RDW: 16.4 % — AB (ref 11.5–15.5)
WBC: 8 10*3/uL (ref 4.0–10.5)

## 2015-09-08 NOTE — Telephone Encounter (Signed)
[facility] RN heather called CHF clinic triage again to inform us that ANAEROBIC POSITIVE GRAM NEGATIVE RODS as well. Providers made aware, patient coming to [facility] ED.  Ave Filter

## 2015-09-08 NOTE — ED Notes (Signed)
MD at bedside. 

## 2015-09-08 NOTE — ED Notes (Signed)
Pt is heart failure patient with right PICC line and is on milrinone. HF clinic called today and said it was infected and she needed to come in and get it changed. Pt denies cp or sob. Is a x 4. Very pleasant patient.

## 2015-09-08 NOTE — ED Provider Notes (Signed)
CSN: [id number]     Arrival date & time 09/08/15  1623 History  By signing my name below, I, Emmanuella Mensah, attest that this documentation has been prepared under the direction and in the presence of Zadie Rhine, MD. Electronically Signed: Angelene Giovanni, ED Scribe. 09/08/2015. 11:39 PM.    Chief Complaint  Patient presents with  . Vascular Access Problem   The history is provided by the patient. No language interpreter was used.   HPI Comments: Katrina Peck is a 70 y.o. female with a hx of chronic systolic CHF, NICM, and HTN who presents to the Emergency Department per advise of PCP for admission for infection to PICC line. She explains that her PCP advised her to come in to have PICC line changed due to the infection. Pt has a current PICC line to her right upper chest which was placed March 2017. She reports that she has been having ongoing fever, chills, SOB, and chest pain for several days. She states that she has not been eating appropriately because she has immediate BM after eating. She adds that she has back pain but attributes that to a chronic neck pain. No alleviating factors noted. Pt has not tried any medications PTA. She denies any abdominal pain, n/v, or HA.     Past Medical History  Diagnosis Date  . Anemia   . Hypertension   . Blood transfusion without reported diagnosis   . Chronic systolic CHF (congestive heart failure) (HCC)     a. 06/2014 Echo: EF 20%.  Marland Kitchen NICM (nonischemic cardiomyopathy) (HCC)     a. 06/2014 Echo: EF 20%, diff HK, mild MR, mildly dil LA/RA, mildly reduced RV fxn;  b. 06/2014 Cath: nl cors.  . Pericardial effusion     a. 06/2014 Large effusion, no tamponade phys.   Past Surgical History  Procedure Laterality Date  . No past surgeries    . Left and right heart catheterization with coronary angiogram N/A 07/23/2014    Procedure: LEFT AND RIGHT HEART CATHETERIZATION WITH CORONARY ANGIOGRAM;  Surgeon: Laurey Morale, MD;  Location: [facility] CATH LAB;   Service: Cardiovascular;  Laterality: N/A;  . Subxyphoid pericardial window N/A 07/26/2014    Procedure: SUBXYPHOID PERICARDIAL WINDOW;  Surgeon: Delight Ovens, MD;  Location: [facility] OR;  Service: Thoracic;  Laterality: N/A;  . Tee without cardioversion N/A 07/26/2014    Procedure: TRANSESOPHAGEAL ECHOCARDIOGRAM (TEE);  Surgeon: Delight Ovens, MD;  Location: [facility] OR;  Service: Thoracic;  Laterality: N/A;  . Pleural effusion drainage N/A 07/26/2014    Procedure: DRAINAGE OF PERICARDIAL EFFUSION;  Surgeon: Delight Ovens, MD;  Location: [facility] OR;  Service: Thoracic;  Laterality: N/A;  . Cardiac catheterization N/A 02/07/2015    Procedure: Right Heart Cath;  Surgeon: Laurey Morale, MD;  Location: [facility] INVASIVE CV LAB;  Service: Cardiovascular;  Laterality: N/A;  . Tee without cardioversion N/A 06/06/2015    Procedure: TRANSESOPHAGEAL ECHOCARDIOGRAM (TEE);  Surgeon: Dolores Patty, MD;  Location: [facility] ENDOSCOPY;  Service: Cardiovascular;  Laterality: N/A;  . Radiology with anesthesia N/A 06/08/2015    Procedure: MRI CERVICAL SPINE WITH/WITHOUT LUMBAR WITH/WITHOUT THORACIC WITH/WITHOUT       (RADIOLOGY WITH ANESTHESIA);  Surgeon: Medication Radiologist, MD;  Location: [facility] OR;  Service: Radiology;  Laterality: N/A;   Family History  Problem Relation Age of Onset  . Hypertension Sister   . Diabetes Maternal Grandmother   . Cancer Mother     died young (pt was only in [employer] @  the time)  . Other Father     died in his 42's.   Social History  Substance Use Topics  . Smoking status: Never Smoker   . Smokeless tobacco: Never Used  . Alcohol Use: No   OB History    No data available     Review of Systems  Constitutional: Positive for fever and chills.  Respiratory: Positive for shortness of breath.   Cardiovascular: Positive for chest pain.  Gastrointestinal: Negative for nausea, vomiting and abdominal pain.  Neurological: Negative for headaches.  All other systems reviewed and are  negative.     Allergies  Review of patient's allergies indicates no known allergies.  Home Medications   Prior to Admission medications   Medication Sig Start Date End Date Taking? Authorizing Provider  amiodarone (PACERONE) 100 MG tablet Take 1 tablet (100 mg total) by mouth daily. 11/30/14   Amy D Filbert Schilder, NP  magnesium oxide (MAG-OX) 400 MG tablet Take 200 mg by mouth daily as needed (leg cramping).    Historical Provider, MD  methocarbamol (ROBAXIN) 500 MG tablet Take 500 mg by mouth daily.     Historical Provider, MD  milrinone (PRIMACOR) 20 MG/100ML SOLN infusion Inject 11.6 mcg/min into the vein continuous. 03/28/15   Calvert Cantor, MD  oxyCODONE (OXY IR/ROXICODONE) 5 MG immediate release tablet Take 2 tablets (10 mg total) by mouth every 4 (four) hours as needed for moderate pain or severe pain. 06/16/15   Tiffany L Reed, DO  potassium chloride SA (K-DUR,KLOR-CON) 20 MEQ tablet Take 20 mEq by mouth daily.    Historical Provider, MD  spironolactone (ALDACTONE) 25 MG tablet Take 0.5 tablets (12.5 mg total) by mouth daily. 09/06/15   Graciella Freer, PA-C  torsemide (DEMADEX) 20 MG tablet Take 20 mg by mouth every other day.    Historical Provider, MD  warfarin (COUMADIN) 2 MG tablet Take as directed by Coumadin Clinic 08/25/15   Laurey Morale, MD   BP 127/73 mmHg  Pulse 88  Temp(Src) 98.1 F (36.7 C) (Oral)  Resp 18  SpO2 100% Physical Exam  Nursing note and vitals reviewed. CONSTITUTIONAL: Chronically ill appearing HEAD: Normocephalic/atraumatic ENMT: Mucous membranes moist NECK: supple no meningeal signs SPINE/BACK:entire spine nontender CV: S1/S2 noted, no murmurs/rubs/gallops noted LUNGS: Lungs are clear to auscultation bilaterally, no apparent distress ABDOMEN: soft, nontender, no rebound or guarding, bowel sounds noted throughout abdomen NEURO: Pt is awake/alert/appropriate, moves all extremitiesx4.  No facial droop.   EXTREMITIES: pulses normal/equal, full ROM SKIN:  warm, color normal, PICC line noted to right upper chest PSYCH: no abnormalities of mood noted, alert and oriented to situation  ED Course  Procedures  DIAGNOSTIC STUDIES: Oxygen Saturation is 100% on RA, normal by my interpretation.    COORDINATION OF CARE: 11:37 PM- Pt advised of plan for treatment and pt agrees. Pt will receive lab work and EKG for further evaluation.   12:15 AM Pt with h/o CHF on milirinone drip Current PICC access in right chest placed in march after she had diskitis and had other line removed Recent Outpatient labs reveal gram neg rods She is not septic appearing Will need admit Peripheral IV placed and cultures ordered  d/w dr gardner for admission - will start zosyn thru peripheral line and will hold off on removing PICC access for now Pt updated on plan  Labs Review Labs Reviewed  CBC - Abnormal; Notable for the following:    Hemoglobin 10.2 (*)    HCT 32.8 (*)  MCH 25.8 (*)    RDW 16.4 (*)    All other components within normal limits  PROTIME-INR - Abnormal; Notable for the following:    Prothrombin Time 18.9 (*)    INR 1.58 (*)    All other components within normal limits  CULTURE, BLOOD (ROUTINE X 2)  CULTURE, BLOOD (ROUTINE X 2)  BASIC METABOLIC PANEL  URINALYSIS, ROUTINE W REFLEX MICROSCOPIC (NOT AT [facility])  I-STAT TROPOININ, ED      Zadie Rhine, MD has personally reviewed and evaluated these images and lab results as part of his medical decision-making.   EKG Interpretation   Date/Time:  Thursday September 08 2015 17:13:11 EDT Ventricular Rate:  86 PR Interval:  142 QRS Duration: 146 QT Interval:  446 QTC Calculation: 533 R Axis:   91 Text Interpretation:  Normal sinus rhythm Rightward axis Left ventricular  hypertrophy with QRS widening and repolarization abnormality Abnormal ECG  No significant change since last tracing Confirmed by Bebe Shaggy  MD, Dorinda Hill  [phone]) on 09/08/2015 11:27:29 PM      MDM   Final diagnoses:   Bacteremia    Nursing notes including past medical history and social history reviewed and considered in documentation Labs/vital reviewed myself and considered during evaluation   Nursing notes including past medical history and social history reviewed and considered in documentation Labs/vital reviewed myself and considered during evaluation Previous records reviewed and considered   I personally performed the services described in this documentation, which was scribed in my presence. The recorded information has been reviewed and is accurate.      Zadie Rhine, MD 09/09/15 [phone]

## 2015-09-08 NOTE — Telephone Encounter (Signed)
Received critical lab results from [facility] RN heather this morning. Blood Cultures POSITIVE AEROBIC GRAM NEGATIVE RODS. Dr. Gala Romney and Otilio Saber PA made aware, advised patient per providers to come to [facility] ED for admission by triad hospitalists, CHF team to consult, PICC exchange. Patient very upset and overwhelmed by this news, frustrated that she keeps getting infections, does not want another PICC because "these lines keep giving me infections, they know what they're doing to me".  Attempted to calm patient down and reassure her that emotions and frustrations felt were normal, and once her husband gets home from his appointment to head to ED. Patient aware and agreeable to coming to ED, but wants to discuss plan of care further once she arrives before new PICC is placed.  Ave Filter

## 2015-09-09 ENCOUNTER — Inpatient Hospital Stay (HOSPITAL_COMMUNITY): Payer: Medicare Other

## 2015-09-09 ENCOUNTER — Encounter (HOSPITAL_COMMUNITY): Payer: Self-pay | Admitting: Physician Assistant

## 2015-09-09 DIAGNOSIS — Z9119 Patient's noncompliance with other medical treatment and regimen: Secondary | ICD-10-CM | POA: Diagnosis not present

## 2015-09-09 DIAGNOSIS — Z79899 Other long term (current) drug therapy: Secondary | ICD-10-CM | POA: Diagnosis not present

## 2015-09-09 DIAGNOSIS — T80219A Unspecified infection due to central venous catheter, initial encounter: Secondary | ICD-10-CM

## 2015-09-09 DIAGNOSIS — R509 Fever, unspecified: Secondary | ICD-10-CM

## 2015-09-09 DIAGNOSIS — I213 ST elevation (STEMI) myocardial infarction of unspecified site: Secondary | ICD-10-CM | POA: Diagnosis not present

## 2015-09-09 DIAGNOSIS — Z79891 Long term (current) use of opiate analgesic: Secondary | ICD-10-CM | POA: Diagnosis not present

## 2015-09-09 DIAGNOSIS — I480 Paroxysmal atrial fibrillation: Secondary | ICD-10-CM | POA: Diagnosis not present

## 2015-09-09 DIAGNOSIS — Z8249 Family history of ischemic heart disease and other diseases of the circulatory system: Secondary | ICD-10-CM | POA: Diagnosis not present

## 2015-09-09 DIAGNOSIS — R7881 Bacteremia: Secondary | ICD-10-CM | POA: Diagnosis not present

## 2015-09-09 DIAGNOSIS — A498 Other bacterial infections of unspecified site: Secondary | ICD-10-CM

## 2015-09-09 DIAGNOSIS — I5022 Chronic systolic (congestive) heart failure: Secondary | ICD-10-CM | POA: Diagnosis not present

## 2015-09-09 DIAGNOSIS — I472 Ventricular tachycardia: Secondary | ICD-10-CM | POA: Diagnosis present

## 2015-09-09 DIAGNOSIS — I429 Cardiomyopathy, unspecified: Secondary | ICD-10-CM

## 2015-09-09 DIAGNOSIS — G8929 Other chronic pain: Secondary | ICD-10-CM | POA: Diagnosis present

## 2015-09-09 DIAGNOSIS — Z8619 Personal history of other infectious and parasitic diseases: Secondary | ICD-10-CM | POA: Diagnosis not present

## 2015-09-09 DIAGNOSIS — R197 Diarrhea, unspecified: Secondary | ICD-10-CM | POA: Diagnosis present

## 2015-09-09 DIAGNOSIS — R0602 Shortness of breath: Secondary | ICD-10-CM | POA: Diagnosis not present

## 2015-09-09 DIAGNOSIS — I447 Left bundle-branch block, unspecified: Secondary | ICD-10-CM | POA: Diagnosis present

## 2015-09-09 DIAGNOSIS — T80211A Bloodstream infection due to central venous catheter, initial encounter: Secondary | ICD-10-CM | POA: Diagnosis present

## 2015-09-09 DIAGNOSIS — Z833 Family history of diabetes mellitus: Secondary | ICD-10-CM | POA: Diagnosis not present

## 2015-09-09 DIAGNOSIS — B9689 Other specified bacterial agents as the cause of diseases classified elsewhere: Secondary | ICD-10-CM | POA: Diagnosis present

## 2015-09-09 DIAGNOSIS — T80219S Unspecified infection due to central venous catheter, sequela: Secondary | ICD-10-CM | POA: Diagnosis not present

## 2015-09-09 DIAGNOSIS — Z681 Body mass index (BMI) 19 or less, adult: Secondary | ICD-10-CM | POA: Diagnosis not present

## 2015-09-09 DIAGNOSIS — I313 Pericardial effusion (noninflammatory): Secondary | ICD-10-CM | POA: Diagnosis present

## 2015-09-09 DIAGNOSIS — M542 Cervicalgia: Secondary | ICD-10-CM | POA: Diagnosis present

## 2015-09-09 DIAGNOSIS — I513 Intracardiac thrombosis, not elsewhere classified: Secondary | ICD-10-CM | POA: Diagnosis present

## 2015-09-09 DIAGNOSIS — Y848 Other medical procedures as the cause of abnormal reaction of the patient, or of later complication, without mention of misadventure at the time of the procedure: Secondary | ICD-10-CM | POA: Diagnosis present

## 2015-09-09 DIAGNOSIS — I110 Hypertensive heart disease with heart failure: Secondary | ICD-10-CM | POA: Diagnosis present

## 2015-09-09 DIAGNOSIS — I319 Disease of pericardium, unspecified: Secondary | ICD-10-CM | POA: Diagnosis not present

## 2015-09-09 DIAGNOSIS — Z452 Encounter for adjustment and management of vascular access device: Secondary | ICD-10-CM | POA: Diagnosis not present

## 2015-09-09 DIAGNOSIS — Z7901 Long term (current) use of anticoagulants: Secondary | ICD-10-CM | POA: Diagnosis not present

## 2015-09-09 DIAGNOSIS — M549 Dorsalgia, unspecified: Secondary | ICD-10-CM | POA: Diagnosis present

## 2015-09-09 LAB — URINALYSIS, ROUTINE W REFLEX MICROSCOPIC
BILIRUBIN URINE: NEGATIVE
GLUCOSE, UA: NEGATIVE mg/dL
KETONES UR: 40 mg/dL — AB
Nitrite: NEGATIVE
PH: 6 (ref 5.0–8.0)
PROTEIN: 100 mg/dL — AB
Specific Gravity, Urine: 1.029 (ref 1.005–1.030)

## 2015-09-09 LAB — DIFFERENTIAL
BASOS ABS: 0 10*3/uL (ref 0.0–0.1)
BASOS PCT: 0 %
EOS ABS: 0.1 10*3/uL (ref 0.0–0.7)
Eosinophils Relative: 1 %
Lymphocytes Relative: 18 %
Lymphs Abs: 1.6 10*3/uL (ref 0.7–4.0)
Monocytes Absolute: 0.8 10*3/uL (ref 0.1–1.0)
Monocytes Relative: 9 %
NEUTROS PCT: 72 %
Neutro Abs: 6.5 10*3/uL (ref 1.7–7.7)

## 2015-09-09 LAB — BASIC METABOLIC PANEL
Anion gap: 8 (ref 5–15)
BUN: 11 mg/dL (ref 6–20)
CHLORIDE: 108 mmol/L (ref 101–111)
CO2: 23 mmol/L (ref 22–32)
CREATININE: 0.98 mg/dL (ref 0.44–1.00)
Calcium: 9.4 mg/dL (ref 8.9–10.3)
GFR calc non Af Amer: 58 mL/min — ABNORMAL LOW (ref >60)
Glucose, Bld: 74 mg/dL (ref 65–99)
Potassium: 4 mmol/L (ref 3.5–5.1)
Sodium: 139 mmol/L (ref 135–145)

## 2015-09-09 LAB — URINE MICROSCOPIC-ADD ON

## 2015-09-09 LAB — PROTIME-INR
INR: 1.6 — ABNORMAL HIGH (ref 0.00–1.49)
Prothrombin Time: 19.1 seconds — ABNORMAL HIGH (ref 11.6–15.2)

## 2015-09-09 MED ORDER — AMIODARONE HCL 100 MG PO TABS
100.0000 mg | ORAL_TABLET | Freq: Every day | ORAL | Status: DC
  Administered 2015-09-09 – 2015-09-16 (×8): 100 mg via ORAL
  Filled 2015-09-09 (×8): qty 1

## 2015-09-09 MED ORDER — PIPERACILLIN-TAZOBACTAM 3.375 G IVPB
3.3750 g | Freq: Three times a day (TID) | INTRAVENOUS | Status: DC
Start: 2015-09-09 — End: 2015-09-09
  Administered 2015-09-09 (×2): 3.375 g via INTRAVENOUS
  Filled 2015-09-09 (×4): qty 50

## 2015-09-09 MED ORDER — SPIRONOLACTONE 12.5 MG HALF TABLET
12.5000 mg | ORAL_TABLET | Freq: Every day | ORAL | Status: DC
  Administered 2015-09-09 – 2015-09-11 (×3): 12.5 mg via ORAL
  Filled 2015-09-09 (×6): qty 1

## 2015-09-09 MED ORDER — METHOCARBAMOL 500 MG PO TABS
500.0000 mg | ORAL_TABLET | Freq: Every day | ORAL | Status: DC
  Administered 2015-09-09 – 2015-09-16 (×8): 500 mg via ORAL
  Filled 2015-09-09 (×8): qty 1

## 2015-09-09 MED ORDER — WARFARIN SODIUM 3 MG PO TABS
6.0000 mg | ORAL_TABLET | Freq: Once | ORAL | Status: AC
  Administered 2015-09-09: 6 mg via ORAL
  Filled 2015-09-09: qty 2

## 2015-09-09 MED ORDER — OXYCODONE HCL 5 MG PO TABS: 10.0000 mg | ORAL_TABLET | ORAL | Status: DC | PRN

## 2015-09-09 MED ORDER — TORSEMIDE 20 MG PO TABS
20.0000 mg | ORAL_TABLET | ORAL | Status: DC
  Administered 2015-09-09 – 2015-09-15 (×3): 20 mg via ORAL
  Filled 2015-09-09 (×5): qty 1

## 2015-09-09 MED ORDER — SODIUM CHLORIDE 0.9 % IV SOLN
250.0000 mg | Freq: Three times a day (TID) | INTRAVENOUS | Status: DC
  Administered 2015-09-09 – 2015-09-10 (×4): 250 mg via INTRAVENOUS
  Filled 2015-09-09 (×6): qty 250

## 2015-09-09 MED ORDER — WARFARIN VIDEO
Freq: Once | Status: AC
  Administered 2015-09-09: 14:00:00

## 2015-09-09 MED ORDER — WARFARIN - PHARMACIST DOSING INPATIENT
Freq: Every day | Status: DC
  Administered 2015-09-11: 17:00:00

## 2015-09-09 MED ORDER — MILRINONE IN DEXTROSE 20 MG/100ML IV SOLN: 0.2500 ug/kg/min | INTRAVENOUS | Status: DC

## 2015-09-09 MED ORDER — COUMADIN BOOK
Freq: Once | Status: AC
  Administered 2015-09-09: 14:00:00
  Filled 2015-09-09: qty 1

## 2015-09-09 MED ORDER — POTASSIUM CHLORIDE CRYS ER 20 MEQ PO TBCR
20.0000 meq | EXTENDED_RELEASE_TABLET | Freq: Every day | ORAL | Status: DC
  Administered 2015-09-09 – 2015-09-16 (×8): 20 meq via ORAL
  Filled 2015-09-09 (×8): qty 1

## 2015-09-09 MED ORDER — WARFARIN SODIUM 6 MG PO TABS
6.0000 mg | ORAL_TABLET | ORAL | Status: AC
  Administered 2015-09-09: 6 mg via ORAL
  Filled 2015-09-09: qty 1

## 2015-09-09 MED ORDER — MILRINONE LACTATE IN DEXTROSE 20-5 MG/100ML-% IV SOLN: 0.2500 ug/kg/min | INTRAVENOUS | Status: DC

## 2015-09-09 MED ORDER — PIPERACILLIN-TAZOBACTAM 3.375 G IVPB 30 MIN
3.3750 g | Freq: Once | INTRAVENOUS | Status: AC
  Administered 2015-09-09: 3.375 g via INTRAVENOUS
  Filled 2015-09-09: qty 50

## 2015-09-09 MED ORDER — MAGNESIUM OXIDE 400 (241.3 MG) MG PO TABS: 200.0000 mg | ORAL_TABLET | Freq: Every day | ORAL | Status: DC | PRN

## 2015-09-09 MED ORDER — MILRINONE LACTATE IN DEXTROSE 20-5 MG/100ML-% IV SOLN
0.2500 ug/kg/min | INTRAVENOUS | Status: DC
  Administered 2015-09-09 – 2015-09-14 (×4): 0.25 ug/kg/min via INTRAVENOUS
  Filled 2015-09-09 (×5): qty 100

## 2015-09-09 NOTE — Progress Notes (Signed)
ANTICOAGULATION/ANTIBIOTIC CONSULT NOTE - Initial Consult  Pharmacy Consult for Coumadin, Milrinone, and Zosyn Indication: LV thrombus, low output CHF, and GNR bacteremia  No Known Allergies  Patient Measurements:    Vital Signs: Temp: 98.1 F (36.7 C) (06/15 1653) Temp Source: Oral (06/15 1653) BP: 143/98 mmHg (06/16 0000) Pulse Rate: 96 (06/16 0000)  Labs:  Recent Labs  09/08/15 1722  HGB 10.2*  HCT 32.8*  PLT 198  LABPROT 18.9*  INR 1.58*  CREATININE 0.86    Estimated Creatinine Clearance: 44.3 mL/min (by C-G formula based on Cr of 0.86).   Medical History: Past Medical History  Diagnosis Date  . Anemia   . Hypertension   . Blood transfusion without reported diagnosis   . Chronic systolic CHF (congestive heart failure) (HCC)     a. 06/2014 Echo: EF 20%.  Marland Kitchen NICM (nonischemic cardiomyopathy) (HCC)     a. 06/2014 Echo: EF 20%, diff HK, mild MR, mildly dil LA/RA, mildly reduced RV fxn;  b. 06/2014 Cath: nl cors.  . Pericardial effusion     a. 06/2014 Large effusion, no tamponade phys.    Medications:  See electronic med rec  Assessment: 70 y.o. F presented to PCP with fever, chills, SOB - PICC line infection so advised to come to ED. o/p blood cx growing GNR per ED MD.  Anticoagulation: Coumadin PTA for LV thrombus. INR 1.58 (subtherapeutic) on admission.  Home dose: 4mg  daily - last taken 6/15 pm  Infectious Disease:  Zosyn for GNR bacteremia. WBC wnl. Afeb since admission. **pt with admission 05/2015 with MSSA/Klebsiella bacteremia along with discitis - received Rocephin x 6 wks**  6/16 Zosyn>>  6/13 BCx x 2>>GNR 6/16 BCx x2>>  Cardiovascular: Home milrinone for low o/p CHF. Verifed home dose: 0.39mcg/kg/min (wt 45 kg)  Goal of Therapy:  INR 2-3 Monitor platelets by anticoagulation protocol: Yes  Resolution of infection   Plan:  Coumadin 6mg  now Daily INR Zosyn 3.375gm IV now over 30 min then 3.375gm IV q8h - subsequent doses over 4 hours Will  f/u micro data, renal function, and pt's clinical condition Continue milrinone 0.57mcg/kg/min with dosing wt 45 kg (this is pt's chronic home dose) BMET daily, I/O q8h, daily wt, VSS q4h  Christoper Fabian, PharmD, BCPS Clinical pharmacist, pager [phone] 09/09/2015,12:18 AM

## 2015-09-09 NOTE — Consult Note (Signed)
[place] for Infectious Disease         Reason for Consult: Bacteremia   Referring Physician: [facility]  Principal Problem:   Bacteremia Active Problems:   Chronic systolic heart failure (HCC)   HPI: Katrina Peck is a 70 y.o. 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 Blood cultures were drawn along with CBC, BMET. Blood cultures grew aerobic gram negative rods and patient was called and asked to come to the hospital for treatment and PICC line exchange.   In the ED, patient was afebrile, normotensive, HR 80s, RR 18, and pulse ox 99% on room air. CBC showed no leukocytosis (8.0). BMET was within normal limits. UA showed WBC, small leukocytosis, no nitrite, and rare bacteria. Patient was started on Zosyn via peripheral IV. Patient had her tunneled catheter removed by IR today. Repeat BCx were sent on 6/15 and 6/16.   Patient was seen and examined this morning. Patient states for the past one month she has experienced loose, watery stools and urgency after eating. She denies any melena or hematochezia. She will have 4-5 bowel movements a day. Her loose stools were attributed to a new medication which was completed 2 weeks ago. One week ago on 6/9, patient developed subjective fevers and chills. She denies any associated cough, shortness of breath, nausea, vomiting, abdominal pain or diarrhea. No erythema at her catheter site. She denies dysuria or increased urinary frequency. She feels that her diarrhea and appetite are improving.   Past Medical History  Diagnosis Date  . Anemia   . Hypertension   . Blood transfusion without reported diagnosis   . Chronic systolic CHF  (congestive heart failure) ([place])     a. 06/2014 Echo: EF 20%.  Marland Kitchen NICM (nonischemic cardiomyopathy) ([place])     a. 06/2014 Echo: EF 20%, diff HK, mild MR, mildly dil LA/RA, mildly reduced RV fxn;  b. 06/2014 Cath: nl cors.  . Pericardial effusion     a. 06/2014 Large effusion, no tamponade phys.    Allergies: No Known Allergies  Current antibiotics:   MEDICATIONS: . amiodarone  100 mg Oral Daily  . coumadin book   Does not apply Once  . methocarbamol  500 mg Oral Daily  . piperacillin-tazobactam (ZOSYN)  IV  3.375 g Intravenous Q8H  . potassium chloride SA  20 mEq Oral Daily  . spironolactone  12.5 mg Oral Daily  . torsemide  20 mg Oral QODAY  . warfarin  6 mg Oral ONCE-1800  . warfarin   Does not apply Once  . Warfarin - Pharmacist Dosing Inpatient   Does not apply q1800    Social History  Substance Use Topics  . Smoking status: Never Smoker   . Smokeless tobacco: Never Used  . Alcohol Use: No    Family History  Problem Relation Age of Onset  . Hypertension Sister   . Diabetes Maternal Grandmother   . Cancer Mother     died young (pt was only in [employer] @ the time)  . Other Father     died in his 18's.    Review of Systems: General: Admits to fever, chills, fatigue, and decreased appetite. Denies diaphoresis.  Respiratory: Denies SOB,  cough.   Cardiovascular: Denies chest pain and palpitations.  Gastrointestinal: Admits to diarrhea. Denies nausea, vomiting, abdominal pain, constipation, blood in stool.  Genitourinary: Denies dysuria, urgency, frequency, suprapubic pain and flank pain. Musculoskeletal: Denies myalgias, back pain.  Skin: Denies rash and wounds.  Neurological: Denies dizziness, headaches, weakness, lightheadedness Psychiatric/Behavioral: Denies mood changes, confusion.  OBJECTIVE: Filed Vitals:   09/09/15 0215 09/09/15 0529 09/09/15 0842 09/09/15 1154  BP: 132/86 127/86 106/80 108/61  Pulse: 102 107 110 94  Temp: 98.1 F (36.7 C) 98.9 F (37.2  C) 100.2 F (37.9 C) 98.4 F (36.9 C)  TempSrc: Oral Oral Oral Oral  Resp: 20 20 20 20   Height: 5' 3"  (1.6 m)     Weight: 99 lb 4.8 oz (45.042 kg)     SpO2: 100% 100% 100% 100%   General: Vital signs reviewed.  Patient is thin, in no acute distress and cooperative with exam.  Cardiovascular: RRR, S1 normal, S2 normal, no murmurs, gallops, or rubs. Pulmonary/Chest: Clear to auscultation bilaterally, no wheezes, rales, or rhonchi. Previous site of catheter C/D/I with bandage in place. Abdominal: Soft, non-tender, non-distended, BS +, no guarding present.  Extremities: No lower extremity edema bilaterally,  pulses symmetric and intact bilaterally. Neurological: Awake, alert, oriented. Answers questions appropriately Skin: Warm, dry and intact. No rashes or erythema. Psychiatric: Normal mood and affect. speech and behavior is normal. Cognition and memory are grossly normal.    LABS: Results for orders placed or performed during the hospital encounter of 09/08/15 (from the past 48 hour(s))  Basic metabolic panel     Status: None   Collection Time: 09/08/15  5:22 PM  Result Value Ref Range   Sodium 137 135 - 145 mmol/L   Potassium 3.9 3.5 - 5.1 mmol/L   Chloride 104 101 - 111 mmol/L   CO2 25 22 - 32 mmol/L   Glucose, Bld 91 65 - 99 mg/dL   BUN 9 6 - 20 mg/dL   Creatinine, Ser 0.86 0.44 - 1.00 mg/dL   Calcium 9.4 8.9 - 10.3 mg/dL   GFR calc non Af Amer >60 >60 mL/min   GFR calc Af Amer >60 >60 mL/min    Comment: (NOTE) The eGFR has been calculated using the CKD EPI equation. This calculation has not been validated in all clinical situations. eGFR's persistently <60 mL/min signify possible Chronic Kidney Disease.    Anion gap 8 5 - 15  CBC     Status: Abnormal   Collection Time: 09/08/15  5:22 PM  Result Value Ref Range   WBC 8.0 4.0 - 10.5 K/uL   RBC 3.96 3.87 - 5.11 MIL/uL   Hemoglobin 10.2 (L) 12.0 - 15.0 g/dL   HCT 32.8 (L) 36.0 - 46.0 %   MCV 82.8 78.0 - 100.0 fL   MCH  25.8 (L) 26.0 - 34.0 pg   MCHC 31.1 30.0 - 36.0 g/dL   RDW 16.4 (H) 11.5 - 15.5 %   Platelets 198 150 - 400 K/uL  Protime-INR (order if Patient is taking Coumadin / Warfarin)     Status: Abnormal   Collection Time: 09/08/15  5:22 PM  Result Value Ref Range   Prothrombin Time 18.9 (H) 11.6 - 15.2 seconds   INR 1.58 (H) 0.00 - 1.49  I-stat troponin, ED     Status: None   Collection Time: 09/08/15  5:37 PM  Result Value Ref Range   Troponin i, poc 0.03 0.00 - 0.08 ng/mL   Comment 3  Comment: Due to the release kinetics of cTnI, a negative result within the first hours of the onset of symptoms does not rule out myocardial infarction with certainty. If myocardial infarction is still suspected, repeat the test at appropriate intervals.   Urinalysis, Routine w reflex microscopic (not at [facility])     Status: Abnormal   Collection Time: 09/09/15  2:22 AM  Result Value Ref Range   Color, Urine AMBER (A) YELLOW    Comment: BIOCHEMICALS MAY BE AFFECTED BY COLOR   APPearance CLOUDY (A) CLEAR   Specific Gravity, Urine 1.029 1.005 - 1.030   pH 6.0 5.0 - 8.0   Glucose, UA NEGATIVE NEGATIVE mg/dL   Hgb urine dipstick LARGE (A) NEGATIVE   Bilirubin Urine NEGATIVE NEGATIVE   Ketones, ur 40 (A) NEGATIVE mg/dL   Protein, ur 100 (A) NEGATIVE mg/dL   Nitrite NEGATIVE NEGATIVE   Leukocytes, UA SMALL (A) NEGATIVE  Urine microscopic-add on     Status: Abnormal   Collection Time: 09/09/15  2:22 AM  Result Value Ref Range   Squamous Epithelial / LPF 0-5 (A) NONE SEEN   WBC, UA 6-30 0 - 5 WBC/hpf   RBC / HPF 6-30 0 - 5 RBC/hpf   Bacteria, UA RARE (A) NONE SEEN   Crystals CA OXALATE CRYSTALS (A) NEGATIVE   Urine-Other MUCOUS PRESENT   Protime-INR     Status: Abnormal   Collection Time: 09/09/15  5:02 AM  Result Value Ref Range   Prothrombin Time 19.1 (H) 11.6 - 15.2 seconds   INR 1.60 (H) 0.00 - 3.14  Basic metabolic panel     Status: Abnormal   Collection Time: 09/09/15  5:02 AM  Result  Value Ref Range   Sodium 139 135 - 145 mmol/L   Potassium 4.0 3.5 - 5.1 mmol/L   Chloride 108 101 - 111 mmol/L   CO2 23 22 - 32 mmol/L   Glucose, Bld 74 65 - 99 mg/dL   BUN 11 6 - 20 mg/dL   Creatinine, Ser 0.98 0.44 - 1.00 mg/dL   Calcium 9.4 8.9 - 10.3 mg/dL   GFR calc non Af Amer 58 (L) >60 mL/min   GFR calc Af Amer >60 >60 mL/min    Comment: (NOTE) The eGFR has been calculated using the CKD EPI equation. This calculation has not been validated in all clinical situations. eGFR's persistently <60 mL/min signify possible Chronic Kidney Disease.    Anion gap 8 5 - 15    MICRO: BCx 6/15 pending BCx 6/16 pending  IMAGING: Dg Chest 1 View  09/09/2015  CLINICAL DATA:  Shortness of breath.  Possible PICC line infection. EXAM: CHEST 1 VIEW COMPARISON:  06/04/2015 FINDINGS: Diffuse enlargement of the cardiac silhouette which could be due to cardiac enlargement and/ or pericardial effusion. No focal airspace disease or consolidation in the lungs. Mild blunting of left costophrenic angle suggesting small pleural effusion. No pneumothorax. Mediastinal contours appear intact. Right central venous catheter with tip over the low SVC region. IMPRESSION: Diffuse enlargement of the cardiac silhouette. Small left pleural effusion. Electronically Signed   By: Lucienne Capers M.D.   On: 09/09/2015 00:59   Ir Removal Tun Cv Cath W/o Fl  09/09/2015  INDICATION: Bacteremia. Request for removal of tunneled central venous catheter. Initially placed by Dr. Vernard Gambles on June 10, 2015 for milrinone use. EXAM: REMOVAL OF TUNNELED CENTRAL VENOUS CATHETER MEDICATIONS: None. ANESTHESIA/SEDATION: No sedation medication given. FLUOROSCOPY TIME:  Fluoroscopy not used. COMPLICATIONS: None immediate. PROCEDURE: Informed written consent  was obtained from the patient following an explanation of the procedure, risks, benefits and alternatives to treatment. A time out was performed prior to the initiation of the procedure.  Sterile technique was utilized including sterile gloves, hand hygiene, and alcohol prep. Utilizing a only gentle traction, the catheter was easily removed intact. Hemostasis was obtained with manual compression. A dressing was placed. The patient tolerated the procedure well without immediate post procedural complication. IMPRESSION: Successful removal of tunneled venous catheter. Read by:  Gareth Eagle, PA-C Electronically Signed   By: Corrie Mckusick D.O.   On: 09/09/2015 12:05   Assessment/Plan:   Katrina Peck is a 70 yo F with PMHx of ICM on milrinone and h/o MSSA and Klebsiella bacteremia in March 2017 secondary to catheter related blood stream infection and associated T11/T12 Discitis treated with IV Rocephin for 6 weeks who is admitted with recurrent gram negative rods bacteremia.  Enterobacter cloacae Bacteremia: Tmax of 100.2 since admission. She is without leukocytosis. Tunneled catheter was removed on 6/16. Zosyn was started on 6/15. Original blood cultures from 6/14 were sent to [place] and I have had these faxed to me. Per report, blood cultures grew enterobacter cloacae (gram-negative anaerobic) sensitive to imipenem, cefepime, gentamicin, tobramycin, ciprofloxacin, levofloxacin, and bactrim. It is resistant to augmentin, zosyn, cefazolin, ceftriaxone, ceftazidime. Bacteremia is possibly due to diarrheal illness as enterobacter cloacae is part of normal gut flora. Patient denies urinary symptoms and UA is not concerning for a UTI.  -Discontinue Zosyn -Start Imipenem per pharmacy -Follow up BCx drawn on 6/15 and 6/16 -Repeat BCx tomorrow 6/17 now that tunneled catheter is out  Chronic HFrEF: Patient is on home milrnone, spironolactone, amiodarone, and torsemide. Appears euvolemic on exam.  -Per primary -Would consider consulting HF team given recent office visit and suggested increase of torsemide  Martyn Malay, DO PGY-2 Internal Medicine Resident Pager # [phone] 09/09/2015  1:57 PM

## 2015-09-09 NOTE — Progress Notes (Signed)
[facility]  Patient Status:   Active pt with [facility]    [facility] is providing the following services:  HHRN and Home Inotrope team for home Milrinone. [facility] hospital team will follow Ms. Gluck to support transition home.   If patient discharges after hours, please call [phone].   Katrina Peck 09/09/2015, 7:40 AM

## 2015-09-09 NOTE — Progress Notes (Signed)
09/09/2015 9:32 AM  Pt was seen and examined. H&P and labs, imaging reviewed. Will ask for ID opinion about removing PICC line.    Maryln Manuel, MD

## 2015-09-09 NOTE — Procedures (Signed)
  Successful removal of tunneled catheter.  No immediate complications.   Pt tolerated well.    Amparo Donalson S Franck Vinal PA-C 09/09/2015 12:06 PM

## 2015-09-09 NOTE — Care Management Note (Signed)
Case Management Note  Patient Details  Name: Katrina Peck MRN: [id number] Date of Birth: Jul 26, 1945  Subjective/Objective:       Pt admitted with bacteremia             Action/Plan:  PTA independent from home with husband.  Active with [facility] for home milrinone and RN - will need resumption services at discharge.  [facility] aware of admit.  CM will request via physician sticky notes.  CM will continue to monitor for disposition needs   Expected Discharge Date:                  Expected Discharge Plan:  Home w Home Health Services  In-House Referral:     Discharge planning Services  CM Consult  Post Acute Care Choice:  Resumption of Svcs/PTA Provider Choice offered to:  Patient  DME Arranged:    DME Agency:     HH Arranged:    HH Agency:     Status of Service:  In process, will continue to follow  Medicare Important Message Given:  Yes Date Medicare IM Given:    Medicare IM give by:    Date Additional Medicare IM Given:    Additional Medicare Important Message give by:     If discussed at Long Length of Stay Meetings, dates discussed:    Additional Comments:  Cherylann Parr, RN 09/09/2015, 10:40 AM

## 2015-09-09 NOTE — ED Notes (Signed)
Dr. Jared at bedside. 

## 2015-09-09 NOTE — Progress Notes (Addendum)
ANTICOAGULATION/ANTIBIOTIC CONSULT NOTE -Follow up  Pharmacy Consult for Coumadin, Milrinone, and Zosyn Indication: h/o LV thrombus, low output CHF, and GNR bacteremia  No Known Allergies  Patient Measurements: Height: 5\' 3"  (160 cm) Weight: 99 lb 4.8 oz (45.042 kg) (scale b) IBW/kg (Calculated) : 52.4  Vital Signs: Temp: 98.4 F (36.9 C) (06/16 1154) Temp Source: Oral (06/16 1154) BP: 108/61 mmHg (06/16 1154) Pulse Rate: 94 (06/16 1154)  Labs:  Recent Labs  09/08/15 1722 09/09/15 0502  HGB 10.2*  --   HCT 32.8*  --   PLT 198  --   LABPROT 18.9* 19.1*  INR 1.58* 1.60*  CREATININE 0.86 0.98    Estimated Creatinine Clearance: 38.5 mL/min (by C-G formula based on Cr of 0.98).   Medical History: Past Medical History  Diagnosis Date  . Anemia   . Hypertension   . Blood transfusion without reported diagnosis   . Chronic systolic CHF (congestive heart failure) (HCC)     a. 06/2014 Echo: EF 20%.  Marland Kitchen NICM (nonischemic cardiomyopathy) (HCC)     a. 06/2014 Echo: EF 20%, diff HK, mild MR, mildly dil LA/RA, mildly reduced RV fxn;  b. 06/2014 Cath: nl cors.  . Pericardial effusion     a. 06/2014 Large effusion, no tamponade phys.    Medications:  See electronic med rec  Assessment: 70 y.o. F presented to PCP with fever, chills, SOB - PICC line infection, thus advised on 09/08/15 to come to ED. o/p blood cx growing GNR per ED MD.  1.ANTICOAGULATION: Coumadin PTA for LV thrombus. INR 1.58 (subtherapeutic) on admission on 09/08/15 Today the INR is 1.60 Home dose: Patient reports her dose is 4mg  daily - last taken 6/15 pm.  Recent outpt anti-coag visit note on 09/02/15 indicates the INR was low at 1.5 on dose of 4mg  qMWF and 6mg  qTTSS  (has 2mg  tablets, confirmed this w/pt).  Anti-coag RN instructed pt to increase dose to 3 tablets x2mg  = 6mg  daily.  I discussed this with patient. She reports taking 2 tablets daily. Then says she sometimes take 3 tablets. She says RN writes the dose  down on paper for her but she sometimes gets dose confused.   We gave her coumadin 6 mg dose last night.  INR 1.6 today    2.CARDIOVASCULAR: Home milrinone for low o/p CHF. Home dose: 0.33mcg/kg/min (wt 45 kg) verified on admission. Continues on same dose. UOP today (4.5 ml/kg/hr) Followed by [facility] services.  3.  INFECTIOUS DISEASE:  Empiric Zosyn for GNR bacteremia. WBC wnl. Afeb since admission. **pt with admission 05/2015 with MSSA/Klebsiella bacteremia along with discitis - received Rocephin x 6 wks** WBC wnl, Afebrile, SCr 0.98, estimated CrCl ~ 38.5 ml/min  (wt=45kg, age 70y.o Female) PICC line removed today 09/09/15 per ID recommendation   6/16 Zosyn>>  6/13 BCx x 2>>GNR 6/16 BCx x2>>sent     Goal of Therapy: CARDIOVASCULAR: INR 2-3 Monitor platelets by anticoagulation protocol: Yes  Resolution of infection   Plan:  Coumadin 6mg  x1 tonight.  Daily INR Continue Milrinone IV infusion: 0.56mcg/kg/min (PTA dose) Continue Zosyn 3.375 gm IV q8h - doses infused over 4 hours Will f/u micro data, renal function, and pt's clinical condition BMET daily, I/O q8h, daily wt, VSS q4h  Noah Delaine, RPh Clinical Pharmacist Pager: [phone] 09/09/2015,12:42 PM   1500 PM:  Change to Primaxin -- 250 mg iv q 8 hours  Thank you Okey Regal, PharmD

## 2015-09-09 NOTE — ED Notes (Signed)
RN attempted to call report to floor; No answer; RN to call back  

## 2015-09-09 NOTE — Care Management Important Message (Signed)
Important Message  Patient Details  Name: Katrina Peck MRN: [id number] Date of Birth: 1945/10/26   Medicare Important Message Given:  Yes    Bernadette Hoit 09/09/2015, 9:24 AM

## 2015-09-09 NOTE — Progress Notes (Signed)
I called the cardiology service to notify the Heart Failure team that the patient had been admitted and asked if they could follow.   Maryln Manuel, MD 09/09/2015 4:19 PM

## 2015-09-09 NOTE — H&P (Signed)
History and Physical    Katrina Peck ZOX:[id number] DOB: Jan 27, 1946 DOA: 09/08/2015   PCP: Margit Hanks, MD Chief Complaint:  Chief Complaint  Patient presents with  . Vascular Access Problem    HPI: Katrina Peck is a 70 y.o. female with medical history significant of chronic systolic CHF with EF 20%, on chronic milrinone infusion at home via a PICC line.  Patient presents to the ED because her cardiologist sent her here for admission.  Patient had been having about 1 week history of generalized malaise, fevers, chills, SOB.  Went to cardiologist who drew blood cultures.  BCx have come back positive for GNR in the aerobic bottle.  Patient was called today by cardiology (see their telephone notes) and sent in to ED.  ED Course: No leukocytosis on work up, UA and CXR are pending.  Review of Systems: As per HPI otherwise 10 point review of systems negative.    Past Medical History  Diagnosis Date  . Anemia   . Hypertension   . Blood transfusion without reported diagnosis   . Chronic systolic CHF (congestive heart failure) (HCC)     a. 06/2014 Echo: EF 20%.  Marland Kitchen NICM (nonischemic cardiomyopathy) (HCC)     a. 06/2014 Echo: EF 20%, diff HK, mild MR, mildly dil LA/RA, mildly reduced RV fxn;  b. 06/2014 Cath: nl cors.  . Pericardial effusion     a. 06/2014 Large effusion, no tamponade phys.    Past Surgical History  Procedure Laterality Date  . No past surgeries    . Left and right heart catheterization with coronary angiogram N/A 07/23/2014    Procedure: LEFT AND RIGHT HEART CATHETERIZATION WITH CORONARY ANGIOGRAM;  Surgeon: Laurey Morale, MD;  Location: [facility] CATH LAB;  Service: Cardiovascular;  Laterality: N/A;  . Subxyphoid pericardial window N/A 07/26/2014    Procedure: SUBXYPHOID PERICARDIAL WINDOW;  Surgeon: Delight Ovens, MD;  Location: [facility] OR;  Service: Thoracic;  Laterality: N/A;  . Tee without cardioversion N/A 07/26/2014    Procedure: TRANSESOPHAGEAL ECHOCARDIOGRAM (TEE);   Surgeon: Delight Ovens, MD;  Location: [facility] OR;  Service: Thoracic;  Laterality: N/A;  . Pleural effusion drainage N/A 07/26/2014    Procedure: DRAINAGE OF PERICARDIAL EFFUSION;  Surgeon: Delight Ovens, MD;  Location: [facility] OR;  Service: Thoracic;  Laterality: N/A;  . Cardiac catheterization N/A 02/07/2015    Procedure: Right Heart Cath;  Surgeon: Laurey Morale, MD;  Location: [facility] INVASIVE CV LAB;  Service: Cardiovascular;  Laterality: N/A;  . Tee without cardioversion N/A 06/06/2015    Procedure: TRANSESOPHAGEAL ECHOCARDIOGRAM (TEE);  Surgeon: Dolores Patty, MD;  Location: [facility] ENDOSCOPY;  Service: Cardiovascular;  Laterality: N/A;  . Radiology with anesthesia N/A 06/08/2015    Procedure: MRI CERVICAL SPINE WITH/WITHOUT LUMBAR WITH/WITHOUT THORACIC WITH/WITHOUT       (RADIOLOGY WITH ANESTHESIA);  Surgeon: Medication Radiologist, MD;  Location: [facility] OR;  Service: Radiology;  Laterality: N/A;     reports that she has never smoked. She has never used smokeless tobacco. She reports that she does not drink alcohol or use illicit drugs.  No Known Allergies  Family History  Problem Relation Age of Onset  . Hypertension Sister   . Diabetes Maternal Grandmother   . Cancer Mother     died young (pt was only in [employer] @ the time)  . Other Father     died in his 53's.     Prior to Admission medications   Medication Sig Start  Date End Date Taking? Authorizing Provider  amiodarone (PACERONE) 100 MG tablet Take 1 tablet (100 mg total) by mouth daily. 11/30/14  Yes Amy D Clegg, NP  magnesium oxide (MAG-OX) 400 MG tablet Take 200 mg by mouth daily as needed (leg cramping).   Yes Historical Provider, MD  oxyCODONE (OXY IR/ROXICODONE) 5 MG immediate release tablet Take 2 tablets (10 mg total) by mouth every 4 (four) hours as needed for moderate pain or severe pain. 06/16/15  Yes Tiffany L Reed, DO  potassium chloride SA (K-DUR,KLOR-CON) 20 MEQ tablet Take 20 mEq by mouth daily.   Yes Historical  Provider, MD  sodium chloride 0.9 % SOLN with milrinone 1 MG/ML SOLN 200 mcg/mL Inject 0.25 mcg/kg/min into the vein continuous. 140mg /140 ml Weight 45 kg   Yes Historical Provider, MD  spironolactone (ALDACTONE) 25 MG tablet Take 0.5 tablets (12.5 mg total) by mouth daily. 09/06/15  Yes Graciella Freer, PA-C  torsemide (DEMADEX) 20 MG tablet Take 20 mg by mouth every other day.   Yes Historical Provider, MD  warfarin (COUMADIN) 2 MG tablet Take as directed by [facility] Patient taking differently: Take 4 mg by mouth daily.  08/25/15  Yes Laurey Morale, MD    Physical Exam: Filed Vitals:   09/08/15 1653 09/08/15 1929 09/09/15 0000  BP: 117/82 127/73 143/98  Pulse: 89 88 96  Temp: 98.1 F (36.7 C)    TempSrc: Oral    Resp: 18 18 22   SpO2: 99% 100% 100%      Constitutional: NAD, calm, comfortable Eyes: PERRL, lids and conjunctivae normal ENMT: Mucous membranes are moist. Posterior pharynx clear of any exudate or lesions.Normal dentition.  Neck: normal, supple, no masses, no thyromegaly Respiratory: clear to auscultation bilaterally, no wheezing, no crackles. Normal respiratory effort. No accessory muscle use.  Cardiovascular: Regular rate and rhythm, no murmurs / rubs / gallops. No extremity edema. 2+ pedal pulses. No carotid bruits.  Abdomen: no tenderness, no masses palpated. No hepatosplenomegaly. Bowel sounds positive.  Musculoskeletal: no clubbing / cyanosis. No joint deformity upper and lower extremities. Good ROM, no contractures. Normal muscle tone.  Skin: no rashes, lesions, ulcers. No induration Neurologic: CN 2-12 grossly intact. Sensation intact, DTR normal. Strength 5/5 in all 4.  Psychiatric: Normal judgment and insight. Alert and oriented x 3. Normal mood.    Labs on Admission: I have personally reviewed following labs and imaging studies  CBC:  Recent Labs Lab 09/08/15 1722  WBC 8.0  HGB 10.2*  HCT 32.8*  MCV 82.8  PLT 198   Basic Metabolic  Panel:  Recent Labs Lab 09/08/15 1722  NA 137  K 3.9  CL 104  CO2 25  GLUCOSE 91  BUN 9  CREATININE 0.86  CALCIUM 9.4   GFR: Estimated Creatinine Clearance: 44.3 mL/min (by C-G formula based on Cr of 0.86). Liver Function Tests: No results for input(s): AST, ALT, ALKPHOS, BILITOT, PROT, ALBUMIN in the last 168 hours. No results for input(s): LIPASE, AMYLASE in the last 168 hours. No results for input(s): AMMONIA in the last 168 hours. Coagulation Profile:  Recent Labs Lab 09/08/15 1722  INR 1.58*   Cardiac Enzymes: No results for input(s): CKTOTAL, CKMB, CKMBINDEX, TROPONINI in the last 168 hours. BNP (last 3 results) No results for input(s): PROBNP in the last 8760 hours. HbA1C: No results for input(s): HGBA1C in the last 72 hours. CBG: No results for input(s): GLUCAP in the last 168 hours. Lipid Profile: No results for input(s): CHOL, HDL,  LDLCALC, TRIG, CHOLHDL, LDLDIRECT in the last 72 hours. Thyroid Function Tests: No results for input(s): TSH, T4TOTAL, FREET4, T3FREE, THYROIDAB in the last 72 hours. Anemia Panel: No results for input(s): VITAMINB12, FOLATE, FERRITIN, TIBC, IRON, RETICCTPCT in the last 72 hours. Urine analysis:    Component Value Date/Time   COLORURINE AMBER* 06/04/2015 2234   APPEARANCEUR CLOUDY* 06/04/2015 2234   LABSPEC 1.028 06/04/2015 2234   PHURINE 5.0 06/04/2015 2234   GLUCOSEU NEGATIVE 06/04/2015 2234   HGBUR LARGE* 06/04/2015 2234   BILIRUBINUR NEGATIVE 06/04/2015 2234   KETONESUR 15* 06/04/2015 2234   PROTEINUR >300* 06/04/2015 2234   UROBILINOGEN 1.0 07/25/2014 2239   NITRITE NEGATIVE 06/04/2015 2234   LEUKOCYTESUR NEGATIVE 06/04/2015 2234   Sepsis Labs: @LABRCNTIP (procalcitonin:4,lacticidven:4) )No results found for this or any previous visit (from the past 240 hour(s)).   Radiological Exams on Admission: Dg Chest 1 View  09/09/2015  CLINICAL DATA:  Shortness of breath.  Possible PICC line infection. EXAM: CHEST 1 VIEW  COMPARISON:  06/04/2015 FINDINGS: Diffuse enlargement of the cardiac silhouette which could be due to cardiac enlargement and/ or pericardial effusion. No focal airspace disease or consolidation in the lungs. Mild blunting of left costophrenic angle suggesting small pleural effusion. No pneumothorax. Mediastinal contours appear intact. Right central venous catheter with tip over the low SVC region. IMPRESSION: Diffuse enlargement of the cardiac silhouette. Small left pleural effusion. Electronically Signed   By: Burman Nieves M.D.   On: 09/09/2015 00:59    EKG: Independently reviewed.  Assessment/Plan Principal Problem:   Bacteremia Active Problems:   Chronic systolic heart failure (HCC)   Bacteremia with GNR -  Empiric zosyn  ID and sensitivities pending  CXR and UA pending  May need ID consult regarding line salvage vs putting in a different PICC.  Likely this will depend on results of cultures  Repeat BCx also performed and pending.  Chronic systolic CHF - continue milrinone, coumadin, amiodarone   DVT prophylaxis: Coumadin Code Status: Full Family Communication: No family in room Consults called: None Admission status: Admit to inpatient   Hillary Bow DO [facility] Pager [phone] from 7PM-7AM  If 7AM-7PM, please contact the day physician for the patient [email or web address] Password [email or web address]  09/09/2015, 1:24 AM

## 2015-09-09 NOTE — Discharge Instructions (Signed)

## 2015-09-10 ENCOUNTER — Other Ambulatory Visit: Payer: Self-pay

## 2015-09-10 DIAGNOSIS — I5022 Chronic systolic (congestive) heart failure: Secondary | ICD-10-CM

## 2015-09-10 DIAGNOSIS — I213 ST elevation (STEMI) myocardial infarction of unspecified site: Secondary | ICD-10-CM

## 2015-09-10 DIAGNOSIS — R0602 Shortness of breath: Secondary | ICD-10-CM

## 2015-09-10 DIAGNOSIS — I319 Disease of pericardium, unspecified: Secondary | ICD-10-CM

## 2015-09-10 LAB — BLOOD CULTURE ID PANEL (REFLEXED)
ACINETOBACTER BAUMANNII: NOT DETECTED
CANDIDA PARAPSILOSIS: NOT DETECTED
CARBAPENEM RESISTANCE: NOT DETECTED
Candida albicans: NOT DETECTED
Candida glabrata: NOT DETECTED
Candida krusei: NOT DETECTED
Candida tropicalis: NOT DETECTED
ENTEROBACTERIACEAE SPECIES: DETECTED — AB
ENTEROCOCCUS SPECIES: NOT DETECTED
Enterobacter cloacae complex: DETECTED — AB
Escherichia coli: NOT DETECTED
Haemophilus influenzae: NOT DETECTED
Klebsiella oxytoca: NOT DETECTED
Klebsiella pneumoniae: NOT DETECTED
Listeria monocytogenes: NOT DETECTED
Methicillin resistance: NOT DETECTED
NEISSERIA MENINGITIDIS: NOT DETECTED
PSEUDOMONAS AERUGINOSA: NOT DETECTED
Proteus species: NOT DETECTED
STAPHYLOCOCCUS AUREUS BCID: NOT DETECTED
STAPHYLOCOCCUS SPECIES: NOT DETECTED
STREPTOCOCCUS AGALACTIAE: NOT DETECTED
STREPTOCOCCUS PYOGENES: NOT DETECTED
STREPTOCOCCUS SPECIES: NOT DETECTED
Serratia marcescens: NOT DETECTED
Streptococcus pneumoniae: NOT DETECTED
VANCOMYCIN RESISTANCE: NOT DETECTED

## 2015-09-10 LAB — BASIC METABOLIC PANEL
ANION GAP: 8 (ref 5–15)
BUN: 18 mg/dL (ref 6–20)
CO2: 26 mmol/L (ref 22–32)
Calcium: 8.9 mg/dL (ref 8.9–10.3)
Chloride: 106 mmol/L (ref 101–111)
Creatinine, Ser: 1.14 mg/dL — ABNORMAL HIGH (ref 0.44–1.00)
GFR calc Af Amer: 56 mL/min — ABNORMAL LOW (ref >60)
GFR, EST NON AFRICAN AMERICAN: 48 mL/min — AB (ref >60)
Glucose, Bld: 138 mg/dL — ABNORMAL HIGH (ref 65–99)
POTASSIUM: 3.9 mmol/L (ref 3.5–5.1)
SODIUM: 140 mmol/L (ref 135–145)

## 2015-09-10 LAB — MAGNESIUM: Magnesium: 1.8 mg/dL (ref 1.7–2.4)

## 2015-09-10 LAB — PROTIME-INR
INR: 2.64 — AB (ref 0.00–1.49)
Prothrombin Time: 27.8 seconds — ABNORMAL HIGH (ref 11.6–15.2)

## 2015-09-10 MED ORDER — SODIUM CHLORIDE 0.9 % IV SOLN
500.0000 mg | Freq: Three times a day (TID) | INTRAVENOUS | Status: DC
  Administered 2015-09-10 – 2015-09-13 (×8): 500 mg via INTRAVENOUS
  Filled 2015-09-10 (×12): qty 500

## 2015-09-10 MED ORDER — WARFARIN SODIUM 2 MG PO TABS
4.0000 mg | ORAL_TABLET | ORAL | Status: AC
  Administered 2015-09-10: 4 mg via ORAL
  Filled 2015-09-10: qty 2

## 2015-09-10 NOTE — Progress Notes (Addendum)
PROGRESS NOTE    Katrina Peck  ZOX:[id number]  DOB: [birth year]  DOA: 09/08/2015 PCP: Margit Hanks, MD Outpatient Specialists:   Hospital course: Katrina Peck is a 70 y.o. 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 Blood cultures were drawn along with CBC, BMET. Blood cultures grew aerobic gram negative rods and patient was called and asked to come to the hospital for treatment and PICC line exchange.   In the ED, patient was afebrile, normotensive, HR 80s, RR 18, and pulse ox 99% on room air. CBC showed no leukocytosis (8.0). BMET was within normal limits. UA showed WBC, small leukocytosis, no nitrite, and rare bacteria. Patient was started on Zosyn via peripheral IV. Patient had her tunneled catheter removed by IR today. Repeat BCx were sent on 6/15 and 6/16.   Patient was seen and examined this morning. Patient states for the past one month she has experienced loose, watery stools and urgency after eating. She denies any melena or hematochezia. She will have 4-5 bowel movements a day. Her loose stools were attributed to a new medication which was completed 2 weeks ago. One week ago on 6/9, patient developed subjective fevers and chills. She denies any associated cough, shortness of breath, nausea, vomiting, abdominal pain or diarrhea. No erythema at her catheter site. She denies dysuria or increased urinary frequency. She feels that her diarrhea and appetite are improving.   Assessment & Plan:   Enterobacter cloacae Bacteremia: Tmax of 100.2 since admission. She is without leukocytosis. Tunneled catheter was removed on 6/16. Zosyn was started on 6/15. Original blood cultures from  6/14 were sent to [facility] [phone] and I have had these faxed to me. Per report, blood cultures grew enterobacter cloacae (gram-negative anaerobic) sensitive to imipenem, cefepime, gentamicin, tobramycin, ciprofloxacin, levofloxacin, and bactrim. It is resistant to augmentin, zosyn, cefazolin, ceftriaxone, ceftazidime. Bacteremia is possibly due to diarrheal illness as enterobacter cloacae is part of normal gut flora.   -Discontinued Zosyn 6/16 -Start Imipenem per pharmacy on 6/16 -Follow up BCx drawn on 6/15 and 6/16 -Repeat BCx ordered 6/17 now that tunneled catheter is out -Echo ordered to eval for vegetations  Chronic systolic CHF/NICM: Patient is on home milrinone, spironolactone, amiodarone, and torsemide. Appears euvolemic on exam.  -Appreciate cardiology consultation -Continuing milrinone in the peripheral IV for now, cautious about repeating central line placement as this is second major central line infection for patient - Echocardiogram ordered  LV Thrombus  - continue warfarin per pharmacy - monitor PT/INR  Chronic pericardial Effusion - checking 2D echocardiogram to assess  DVT prophylaxis: Coumadin Code Status: Full Family Communication: No family in room Consults called: None Admission status: Admit to inpatient  Consultants:  Cardiology   Infectious Disease  Procedures:  PICC line removal 6/16  Antimicrobials: Anti-infectives    Start     Dose/Rate Route Frequency Ordered Stop   09/09/15 1500  imipenem-cilastatin (PRIMAXIN) 250 mg in sodium chloride 0.9 % 100 mL IVPB     250 mg 200 mL/hr over 30 Minutes Intravenous Every 8 hours 09/09/15 1456     09/09/15 0800  piperacillin-tazobactam (ZOSYN) IVPB 3.375 g  Status:  Discontinued     3.375 g 12.5 mL/hr over 240 Minutes  Intravenous Every 8 hours 09/09/15 0035 09/09/15 1437   09/09/15 0015  piperacillin-tazobactam (ZOSYN) IVPB 3.375 g     3.375 g 100 mL/hr over 30 Minutes Intravenous  Once 09/09/15  0003 09/09/15 0206     Subjective: Pt starting to feel better  Objective: Filed Vitals:   09/09/15 1737 09/09/15 2235 09/10/15 0652 09/10/15 1218  BP: 102/61 94/53 94/54  138/118  Pulse: 81   77  Temp: 97.9 F (36.6 C) 98.7 F (37.1 C) 98.6 F (37 C) 97.8 F (36.6 C)  TempSrc: Oral Oral Oral Oral  Resp: 20 20 20 20   Height:      Weight:   98 lb 1.6 oz (44.498 kg)   SpO2: 100% 100% 98% 100%    Intake/Output Summary (Last 24 hours) at 09/10/15 1425 Last data filed at 09/10/15 1300  Gross per 24 hour  Intake  573.6 ml  Output   1650 ml  Net -1076.4 ml   Filed Weights   09/09/15 0215 09/10/15 0652  Weight: 99 lb 4.8 oz (45.042 kg) 98 lb 1.6 oz (44.498 kg)    Exam:  General: Well developed in no acute distress. Head: Normocephalic, atraumatic, sclera non-icteric, nares are without discharge. Neck: JVD not elevated but she has prominent neck veins  Lungs: Clear bilaterally to auscultation without wheezes, rales, or rhonchi. Breathing is unlabored. Heart: PMI laterally displaced. RRR with S1 S2. No murmurs, rubs, or gallops appreciated. Abdomen: Soft, non-tender, non-distended with normoactive bowel sounds. No hepatomegaly. No rebound/guarding. No obvious abdominal masses. Msk: Strength and tone appear normal for age. Extremities: No clubbing or cyanosis. No edema. Distal pedal pulses are 2+ and equal bilaterally. Neuro: Alert and oriented X 3. No facial asymmetry. No focal deficit. Moves all extremities spontaneously. Psych: Responds to questions appropriately with a normal affect.  Data Reviewed: Basic Metabolic Panel:  Recent Labs Lab 09/08/15 1722 09/09/15 0502 09/10/15 0340 09/10/15 0904  NA 137 139 140  --   K 3.9 4.0 3.9  --   CL 104 108 106  --   CO2 25 23 26   --   GLUCOSE 91 74 138*  --   BUN 9 11 18   --   CREATININE 0.86 0.98 1.14*  --   CALCIUM 9.4 9.4 8.9  --   MG  --   --   --  1.8   Liver Function Tests: No results for input(s):  AST, ALT, ALKPHOS, BILITOT, PROT, ALBUMIN in the last 168 hours. No results for input(s): LIPASE, AMYLASE in the last 168 hours. No results for input(s): AMMONIA in the last 168 hours. CBC:  Recent Labs Lab 09/08/15 1722 09/09/15 1630  WBC 8.0  --   NEUTROABS  --  6.5  HGB 10.2*  --   HCT 32.8*  --   MCV 82.8  --   PLT 198  --    Cardiac Enzymes: No results for input(s): CKTOTAL, CKMB, CKMBINDEX, TROPONINI in the last 168 hours. BNP (last 3 results) No results for input(s): PROBNP in the last 8760 hours. CBG: No results for input(s): GLUCAP in the last 168 hours.  Recent Results (from the past 240 hour(s))  Blood culture (routine x 2)     Status: None (Preliminary result)   Collection Time: 09/08/15 11:55 PM  Result Value Ref Range Status   Specimen Description BLOOD LEFT ANTECUBITAL  Final   Special Requests BOTTLES DRAWN AEROBIC AND ANAEROBIC 5CC  Final   Culture  Setup Time   Final  GRAM NEGATIVE RODS IN BOTH AEROBIC AND ANAEROBIC BOTTLES Organism ID to follow CRITICAL RESULT CALLED TO, READ BACK BY AND VERIFIED WITH: VERONDA BRYK, PHARMD @0449  0617/17 MKELLY    Culture TOO YOUNG TO READ  Final   Report Status PENDING  Incomplete  Blood Culture ID Panel (Reflexed)     Status: Abnormal   Collection Time: 09/08/15 11:55 PM  Result Value Ref Range Status   Enterococcus species NOT DETECTED NOT DETECTED Final   Vancomycin resistance NOT DETECTED NOT DETECTED Final   Listeria monocytogenes NOT DETECTED NOT DETECTED Final   Staphylococcus species NOT DETECTED NOT DETECTED Final   Staphylococcus aureus NOT DETECTED NOT DETECTED Final   Methicillin resistance NOT DETECTED NOT DETECTED Final   Streptococcus species NOT DETECTED NOT DETECTED Final   Streptococcus agalactiae NOT DETECTED NOT DETECTED Final   Streptococcus pneumoniae NOT DETECTED NOT DETECTED Final   Streptococcus pyogenes NOT DETECTED NOT DETECTED Final   Acinetobacter baumannii NOT DETECTED NOT DETECTED  Final   Enterobacteriaceae species DETECTED (A) NOT DETECTED Final    Comment: CRITICAL RESULT CALLED TO, READ BACK BY AND VERIFIED WITH: VERONDA BRYK, PHARMD @0449  09/10/15 MKELLY    Enterobacter cloacae complex DETECTED (A) NOT DETECTED Final    Comment: CRITICAL RESULT CALLED TO, READ BACK BY AND VERIFIED WITH: VERONDA BRYK,PHARMD @0449  09/10/15 MKELLY    Escherichia coli NOT DETECTED NOT DETECTED Final   Klebsiella oxytoca NOT DETECTED NOT DETECTED Final   Klebsiella pneumoniae NOT DETECTED NOT DETECTED Final   Proteus species NOT DETECTED NOT DETECTED Final   Serratia marcescens NOT DETECTED NOT DETECTED Final   Carbapenem resistance NOT DETECTED NOT DETECTED Final   Haemophilus influenzae NOT DETECTED NOT DETECTED Final   Neisseria meningitidis NOT DETECTED NOT DETECTED Final   Pseudomonas aeruginosa NOT DETECTED NOT DETECTED Final   Candida albicans NOT DETECTED NOT DETECTED Final   Candida glabrata NOT DETECTED NOT DETECTED Final   Candida krusei NOT DETECTED NOT DETECTED Final   Candida parapsilosis NOT DETECTED NOT DETECTED Final   Candida tropicalis NOT DETECTED NOT DETECTED Final     Studies: Dg Chest 1 View  09/09/2015  CLINICAL DATA:  Shortness of breath.  Possible PICC line infection. EXAM: CHEST 1 VIEW COMPARISON:  06/04/2015 FINDINGS: Diffuse enlargement of the cardiac silhouette which could be due to cardiac enlargement and/ or pericardial effusion. No focal airspace disease or consolidation in the lungs. Mild blunting of left costophrenic angle suggesting small pleural effusion. No pneumothorax. Mediastinal contours appear intact. Right central venous catheter with tip over the low SVC region. IMPRESSION: Diffuse enlargement of the cardiac silhouette. Small left pleural effusion. Electronically Signed   By: Burman Nieves M.D.   On: 09/09/2015 00:59   Ir Removal Peck Cv Cath W/o Fl  09/09/2015  INDICATION: Bacteremia. Request for removal of tunneled central venous  catheter. Initially placed by Dr. Deanne Coffer on June 10, 2015 for milrinone use. EXAM: REMOVAL OF TUNNELED CENTRAL VENOUS CATHETER MEDICATIONS: None. ANESTHESIA/SEDATION: No sedation medication given. FLUOROSCOPY TIME:  Fluoroscopy not used. COMPLICATIONS: None immediate. PROCEDURE: Informed written consent was obtained from the patient following an explanation of the procedure, risks, benefits and alternatives to treatment. A time out was performed prior to the initiation of the procedure. Sterile technique was utilized including sterile gloves, hand hygiene, and alcohol prep. Utilizing a only gentle traction, the catheter was easily removed intact. Hemostasis was obtained with manual compression. A dressing was placed. The patient tolerated the procedure well without immediate post procedural  complication. IMPRESSION: Successful removal of tunneled venous catheter. Read by:  Corrin Parker, PA-C Electronically Signed   By: Gilmer Mor D.O.   On: 09/09/2015 12:05   Scheduled Meds: . amiodarone  100 mg Oral Daily  . imipenem-cilastatin  250 mg Intravenous Q8H  . methocarbamol  500 mg Oral Daily  . potassium chloride SA  20 mEq Oral Daily  . spironolactone  12.5 mg Oral Daily  . torsemide  20 mg Oral QODAY  . Warfarin - Pharmacist Dosing Inpatient   Does not apply q1800   Continuous Infusions: . milrinone 0.25 mcg/kg/min (09/10/15 0230)   Principal Problem:   Bacteremia Active Problems:   NICM (nonischemic cardiomyopathy) (HCC)   Pericardial effusion   Chronic systolic heart failure (HCC)   Paroxysmal atrial fibrillation (HCC)   LV (left ventricular) mural thrombus (HCC)  Standley Dakins, MD, FAAFP [facility] Pager [phone]  If 7PM-7AM, please contact night-coverage [email or web address] Password [email or web address] 09/10/2015, 2:25 PM    LOS: 1 day

## 2015-09-10 NOTE — Progress Notes (Addendum)
ANTICOAGULATION CONSULT NOTE -Follow up  Pharmacy Consult for Coumadin, Milrinone, Primaxin Indication: h/o LV thrombus, low output CHF, enterobacter bacteremia  No Known Allergies  Patient Measurements: Height: 5\' 3"  (160 cm) Weight: 98 lb 1.6 oz (44.498 kg) IBW/kg (Calculated) : 52.4  Vital Signs: Temp: 98.6 F (37 C) (06/17 0652) Temp Source: Oral (06/17 0652) BP: 94/54 mmHg (06/17 0652)  Labs:  Recent Labs  09/08/15 1722 09/09/15 0502 09/10/15 0340  HGB 10.2*  --   --   HCT 32.8*  --   --   PLT 198  --   --   LABPROT 18.9* 19.1* 27.8*  INR 1.58* 1.60* 2.64*  CREATININE 0.86 0.98 1.14*    Estimated Creatinine Clearance: 32.7 mL/min (by C-G formula based on Cr of 1.14).   Medical History: Past Medical History  Diagnosis Date  . Anemia   . Hypertension   . Blood transfusion without reported diagnosis   . Chronic systolic CHF (congestive heart failure) (HCC)     a. 06/2014 Echo: EF 20%.  Marland Kitchen NICM (nonischemic cardiomyopathy) (HCC)     a. 06/2014 Echo: EF 20%, diff HK, mild MR, mildly dil LA/RA, mildly reduced RV fxn;  b. 06/2014 Cath: nl cors.  . Pericardial effusion     a. 06/2014 Large effusion, no tamponade, s/p window for diag/therap - neg malig. b. 6/16 - recurrence w/o tamponade, subsequently folloed by echoes. Mod 12/16.  Marland Kitchen PAF (paroxysmal atrial fibrillation) (HCC)     a. with RVR, converted to NSR with amiodarone.  . Noncompliance   . LV (left ventricular) mural thrombus (HCC)     a. Noted on 12/16 echo.   . Bacteremia     a. MSSA bacteremia and discitis in 3/17, suspect tunneled catheter infection.   . Discitis     Medications:  See electronic med rec  Assessment: 70 y.o. F presented to PCP with fever, chills, SOB - PICC line infection, thus advised on 09/08/15 to come to ED. o/p blood cx growing GNR per ED MD.  1.ANTICOAGULATION: Coumadin PTA for LV thrombus. INR 1.58 (subtherapeutic) on admission on 09/08/15, INR increased 1.6 > 2.64. Home dose:  Patient reports her dose is 4mg  daily - last taken 6/15 pm.   2.CARDIOVASCULAR: Home milrinone for low o/p CHF. Home dose: 0.45mcg/kg/min (wt 45 kg) verified on admission. Continues on same dose. UOP 1.8 ml/kg/hr. No Co-ox this admit. K 3.9, Mg 1.8. HF team following.   3.  INFECTIOUS DISEASE:  Changed Zosyn to Primaxin for Enterobacter bacteremia. WBC wnl. Afeb since admission. **pt with admission 05/2015 with MSSA/Klebsiella bacteremia along with discitis - received Rocephin x 6 wks** PICC line removed today 09/09/15 per ID recommendation  6/16 Zosyn>>6/16 Primaxin 6/16 >>  6/13 BCx x 2>>GNR 6/16 BCx x2>> Enterobacter  Goal of Therapy: CARDIOVASCULAR: INR 2-3 Monitor platelets by anticoagulation protocol: Yes  Resolution of infection   Plan:  Coumadin 4mg  x1 tonight.  Daily INR Continue Milrinone IV infusion: 0.60mcg/kg/min (PTA dose) Continue Primaxin 500 mg IV q8h Will f/u sensitivities, renal function, and pt's clinical condition BMET daily, I/O q8h, daily wt, VSS q4h  Greggory Stallion, PharmD Clinical Pharmacy Resident Pager # [phone] 09/10/2015 12:26 PM

## 2015-09-10 NOTE — Consult Note (Signed)
Cardiology Consultation Note    Patient ID: Katrina Peck, MRN: [id number], DOB/AGE: [id number] y.o. Admit date: 09/08/2015   Date of Consult: 09/10/2015 Primary Physician: Margit Hanks, MD Primary Cardiologist: Shirlee Latch (CHF)  Chief Complaint: positive blood cultures Reason for Consultation: positive blood cultures in setting of milrinone/PICC Requesting MD: Dr. 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   Per review of chart, she was admitted in 06/2014 with sx of CHF and diagnosed with new CHF EF 20%. Cath 5/16 no CAD. Hospital course complicated by atrial fibrillation w/ RVR, converted to NSR on amiodarone. Underwent pericardial window (transudate). She had a repeat echo in 6/16 that showed persistently low LV EF at 15-20% with recurrence of moderate to large pericardial effusion without tamponade. This showed EF 10-15%, mild LV dilation, restrictive diastolic function, moderate to severely decreased RV function, and a moderate to large pericardial effusion similar to 6/16. Admitted in November with marked volume overload. Diuresed with IV lasix. Discharged on milrinone 0.125 mcg. BB and ACE stopped. Discharge weight was 92 pounds. She had problems with her PICC line (numbness/pain in right arm/hand) and wanted the PICC line out. She did not want placement of tunneled catheter. Therefore, PICC was removed and milrinone stopped. She re-developed symptomatic low output CHF and was re-admitted in 12/16. She then had tunneled catheter placed and was restarted on  milrinone and was diuresed. She was discharged home back on milrinone. Echo in 12/16 showed EF 20% with LV thrombus. She was put on coumadin. 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 Tunneled cath was removed and milrinone given through peripheral IV for several days. She will complete a course of ceftriaxone x 6 weeks. Tunneled cath replaced with repeat cultures negative for home inotropic support.Of note, LVAD was previously discussed but Dr. Shirlee Latch did not feel she would be a great candidate with compliance issues; patient was also not interested in having one. Dr. Shirlee Latch also did not think she would derive enough benefit from CRT to get off milrinone and felt it was unlikely to agree to device placement.  She was seen in the CHF clinic 09/06/15 at which time she reported having temp 99.8 with subjective chills - overall had been feeling "pretty good" though. Blood Cx were drawn which were positive for aerobic GNR. She was sent to the ER for admission to IM for recurrent bacteremia. Labs notable for continued subtherapeutic INR (managed through our clinic/HH)-> subsequent value 2.6. Hgb 10.2, c/w prior. Troponin neg x1, VSS except mild tachycardia, tmax 100.2. CXR diffuse enlargement of cardiac silhouette; small left pleural effusion. PICC was removed and she was started on Primaxin. Cultures have since come back + enterobacter cloacae. Her back still bothers her from time to time but she states over the last few days it's done better. She has chronic DOE. Denies any orthonpnea, chest pain, or LEE. She does note she's had on/off fevers for the last week actually.  Past Medical History  Diagnosis Date  . Anemia   .  Hypertension   . Blood transfusion without reported diagnosis   . Chronic systolic CHF (congestive heart failure) (HCC)     a. 06/2014 Echo: EF 20%.  Marland Kitchen NICM (nonischemic  cardiomyopathy) (HCC)     a. 06/2014 Echo: EF 20%, diff HK, mild MR, mildly dil LA/RA, mildly reduced RV fxn;  b. 06/2014 Cath: nl cors.  . Pericardial effusion     a. 06/2014 Large effusion, no tamponade, s/p window for diag/therap - neg malig. b. 6/16 - recurrence w/o tamponade, subsequently folloed by echoes. Mod 12/16.  Marland Kitchen PAF (paroxysmal atrial fibrillation) (HCC)     a. with RVR, converted to NSR with amiodarone.  . Noncompliance   . LV (left ventricular) mural thrombus (HCC)     a. Noted on 12/16 echo.   . Bacteremia     a. MSSA bacteremia and discitis in 3/17, suspect tunneled catheter infection.   . Discitis       Surgical History:  Past Surgical History  Procedure Laterality Date  . No past surgeries    . Left and right heart catheterization with coronary angiogram N/A 07/23/2014    Procedure: LEFT AND RIGHT HEART CATHETERIZATION WITH CORONARY ANGIOGRAM;  Surgeon: Laurey Morale, MD;  Location: [facility] CATH LAB;  Service: Cardiovascular;  Laterality: N/A;  . Subxyphoid pericardial window N/A 07/26/2014    Procedure: SUBXYPHOID PERICARDIAL WINDOW;  Surgeon: Delight Ovens, MD;  Location: [facility] OR;  Service: Thoracic;  Laterality: N/A;  . Tee without cardioversion N/A 07/26/2014    Procedure: TRANSESOPHAGEAL ECHOCARDIOGRAM (TEE);  Surgeon: Delight Ovens, MD;  Location: [facility] OR;  Service: Thoracic;  Laterality: N/A;  . Pleural effusion drainage N/A 07/26/2014    Procedure: DRAINAGE OF PERICARDIAL EFFUSION;  Surgeon: Delight Ovens, MD;  Location: [facility] OR;  Service: Thoracic;  Laterality: N/A;  . Cardiac catheterization N/A 02/07/2015    Procedure: Right Heart Cath;  Surgeon: Laurey Morale, MD;  Location: [facility] INVASIVE CV LAB;  Service: Cardiovascular;  Laterality: N/A;  . Tee without cardioversion N/A 06/06/2015    Procedure: TRANSESOPHAGEAL ECHOCARDIOGRAM (TEE);  Surgeon: Dolores Patty, MD;  Location: [facility] ENDOSCOPY;  Service: Cardiovascular;  Laterality: N/A;  . Radiology with  anesthesia N/A 06/08/2015    Procedure: MRI CERVICAL SPINE WITH/WITHOUT LUMBAR WITH/WITHOUT THORACIC WITH/WITHOUT       (RADIOLOGY WITH ANESTHESIA);  Surgeon: Medication Radiologist, MD;  Location: [facility] OR;  Service: Radiology;  Laterality: N/A;     Home Meds: Prior to Admission medications   Medication Sig Start Date End Date Taking? Authorizing Provider  amiodarone (PACERONE) 100 MG tablet Take 1 tablet (100 mg total) by mouth daily. 11/30/14  Yes Amy D Clegg, NP  magnesium oxide (MAG-OX) 400 MG tablet Take 200 mg by mouth daily as needed (leg cramping).   Yes Historical Provider, MD  oxyCODONE (OXY IR/ROXICODONE) 5 MG immediate release tablet Take 2 tablets (10 mg total) by mouth every 4 (four) hours as needed for moderate pain or severe pain. 06/16/15  Yes Tiffany L Reed, DO  potassium chloride SA (K-DUR,KLOR-CON) 20 MEQ tablet Take 20 mEq by mouth daily.   Yes Historical Provider, MD  sodium chloride 0.9 % SOLN with milrinone 1 MG/ML SOLN 200 mcg/mL Inject 0.25 mcg/kg/min into the vein continuous. 140mg /140 ml Weight 45 kg   Yes Historical Provider, MD  spironolactone (ALDACTONE) 25 MG tablet Take 0.5 tablets (12.5 mg total) by mouth daily. 09/06/15  Yes Graciella Freer, PA-C  torsemide (DEMADEX) 20 MG tablet Take 20  mg by mouth every other day.   Yes Historical Provider, MD  warfarin (COUMADIN) 2 MG tablet Take as directed by [facility] Patient taking differently: Take 4 mg by mouth daily.  08/25/15  Yes Laurey Morale, MD    Inpatient Medications:  . amiodarone  100 mg Oral Daily  . imipenem-cilastatin  250 mg Intravenous Q8H  . methocarbamol  500 mg Oral Daily  . potassium chloride SA  20 mEq Oral Daily  . spironolactone  12.5 mg Oral Daily  . torsemide  20 mg Oral QODAY  . Warfarin - Pharmacist Dosing Inpatient   Does not apply q1800   . milrinone 0.25 mcg/kg/min (09/10/15 0230)    Allergies: No Known Allergies  Social History   Social History  . Marital Status:  Married    Spouse Name: N/A  . Number of Children: N/A  . Years of Education: N/A   Occupational History  . Not on file.   Social History Main Topics  . Smoking status: Never Smoker   . Smokeless tobacco: Never Used  . Alcohol Use: No  . Drug Use: No  . Sexual Activity: No   Other Topics Concern  . Not on file   Social History Narrative   Lives in [place] with husband.  Not currently working.     Family History  Problem Relation Age of Onset  . Hypertension Sister   . Diabetes Maternal Grandmother   . Cancer Mother     died young (pt was only in [employer] @ the time)  . Other Father     died in his 41's.     Review of Systems: no cough. No bleeding. All other systems reviewed and are otherwise negative except as noted above.  Labs: No results for input(s): CKTOTAL, CKMB, TROPONINI in the last 72 hours. Lab Results  Component Value Date   WBC 8.0 09/08/2015   HGB 10.2* 09/08/2015   HCT 32.8* 09/08/2015   MCV 82.8 09/08/2015   PLT 198 09/08/2015    Recent Labs Lab 09/10/15 0340  NA 140  K 3.9  CL 106  CO2 26  BUN 18  CREATININE 1.14*  CALCIUM 8.9  GLUCOSE 138*   No results found for: CHOL, HDL, LDLCALC, TRIG No results found for: DDIMER  Radiology/Studies:  Dg Chest 1 View  09/09/2015  CLINICAL DATA:  Shortness of breath.  Possible PICC line infection. EXAM: CHEST 1 VIEW COMPARISON:  06/04/2015 FINDINGS: Diffuse enlargement of the cardiac silhouette which could be due to cardiac enlargement and/ or pericardial effusion. No focal airspace disease or consolidation in the lungs. Mild blunting of left costophrenic angle suggesting small pleural effusion. No pneumothorax. Mediastinal contours appear intact. Right central venous catheter with tip over the low SVC region. IMPRESSION: Diffuse enlargement of the cardiac silhouette. Small left pleural effusion. Electronically Signed   By: Burman Nieves M.D.   On: 09/09/2015 00:59   Ir Removal Tun Cv Cath W/o  Fl  09/09/2015  INDICATION: Bacteremia. Request for removal of tunneled central venous catheter. Initially placed by Dr. Deanne Coffer on June 10, 2015 for milrinone use. EXAM: REMOVAL OF TUNNELED CENTRAL VENOUS CATHETER MEDICATIONS: None. ANESTHESIA/SEDATION: No sedation medication given. FLUOROSCOPY TIME:  Fluoroscopy not used. COMPLICATIONS: None immediate. PROCEDURE: Informed written consent was obtained from the patient following an explanation of the procedure, risks, benefits and alternatives to treatment. A time out was performed prior to the initiation of the procedure. Sterile technique was utilized including sterile gloves, hand hygiene,  and alcohol prep. Utilizing a only gentle traction, the catheter was easily removed intact. Hemostasis was obtained with manual compression. A dressing was placed. The patient tolerated the procedure well without immediate post procedural complication. IMPRESSION: Successful removal of tunneled venous catheter. Read by:  Corrin Parker, PA-C Electronically Signed   By: Gilmer Mor D.O.   On: 09/09/2015 12:05    Wt Readings from Last 3 Encounters:  09/10/15 98 lb 1.6 oz (44.498 kg)  09/06/15 100 lb 3.2 oz (45.45 kg)  08/17/15 97 lb 6.4 oz (44.18 kg)    EKG: NSR LBBB - EKG link is broken, what I can see appears similar to prior.  Physical Exam: Blood pressure 94/54, pulse 81, temperature 98.6 F (37 C), temperature source Oral, resp. rate 20, height 5\' 3"  (1.6 m), weight 98 lb 1.6 oz (44.498 kg), SpO2 98 %. Body mass index is 17.38 kg/(m^2). General: Well developed thin AAM in no acute distress. Head: Normocephalic, atraumatic, sclera non-icteric, no xanthomas, nares are without discharge.  Neck: JVD not elevated but she has prominent neck veins in general. Lungs: Clear bilaterally to auscultation without wheezes, rales, or rhonchi. Breathing is unlabored. Heart: PMI laterally displaced. RRR with S1 S2. No murmurs, rubs, or gallops appreciated. Abdomen: Soft,  non-tender, non-distended with normoactive bowel sounds. No hepatomegaly. No rebound/guarding. No obvious abdominal masses. Msk:  Strength and tone appear normal for age. Extremities: No clubbing or cyanosis. No edema.  Distal pedal pulses are 2+ and equal bilaterally. Neuro: Alert and oriented X 3. No facial asymmetry. No focal deficit. Moves all extremities spontaneously. Psych:  Responds to questions appropriately with a normal affect.    Assessment and Plan   1. Enterobacter cloacae bacteremia 2. Recent MSSA/Klebsiella bacteremia & discitis 05/2015 3. Chronic systolic CHF/NICM on home milrinone 4. Chronic pericardial effusion 5. LV thrombus, on chronic Coumadin 6. PAF, maintaining NSR on amiodarone 7. NSVT  Will discuss further recommendations regarding continued venous access/milrinone with Dr. Gala Romney. See below for thoughts.  Signed, Laurann Montana PA-C 09/10/2015, 7:43 AM  Patient seen and examined with Ronie Spies, PA-C. We discussed all aspects of the encounter. I agree with the assessment and plan as stated above.   Katrina Peck has advanced HF and has been inotrope dependent. From a functional standpoint she has done well with milrinone but this is her second major infection from her line in the past few months. This infection occurred in setting of a tunneled IJ line so I am very worried about replacing access. I tried to discuss the +/-s with her but she has very limited insight and could not really participate in the discussion. For now, would continue milrinone peripherally and she will discuss with Dr. Shirlee Latch on Monday. HF otherwise well compensated. Wil get echo to reassess pericardial effusion and look for vegetations. Continue coumadin for LV thrombus.  Tiaira Arambula,MD 8:35 AM

## 2015-09-10 NOTE — Progress Notes (Signed)
PHARMACY - PHYSICIAN COMMUNICATION CRITICAL VALUE ALERT - BLOOD CULTURE IDENTIFICATION (BCID)  Results for orders placed or performed during the hospital encounter of 09/08/15  Blood Culture ID Panel (Reflexed) (Collected: 09/08/2015 11:55 PM)  Result Value Ref Range   Enterococcus species NOT DETECTED NOT DETECTED   Vancomycin resistance NOT DETECTED NOT DETECTED   Listeria monocytogenes NOT DETECTED NOT DETECTED   Staphylococcus species NOT DETECTED NOT DETECTED   Staphylococcus aureus NOT DETECTED NOT DETECTED   Methicillin resistance NOT DETECTED NOT DETECTED   Streptococcus species NOT DETECTED NOT DETECTED   Streptococcus agalactiae NOT DETECTED NOT DETECTED   Streptococcus pneumoniae NOT DETECTED NOT DETECTED   Streptococcus pyogenes NOT DETECTED NOT DETECTED   Acinetobacter baumannii NOT DETECTED NOT DETECTED   Enterobacteriaceae species DETECTED (A) NOT DETECTED   Enterobacter cloacae complex DETECTED (A) NOT DETECTED   Escherichia coli NOT DETECTED NOT DETECTED   Klebsiella oxytoca NOT DETECTED NOT DETECTED   Klebsiella pneumoniae NOT DETECTED NOT DETECTED   Proteus species NOT DETECTED NOT DETECTED   Serratia marcescens NOT DETECTED NOT DETECTED   Carbapenem resistance NOT DETECTED NOT DETECTED   Haemophilus influenzae NOT DETECTED NOT DETECTED   Neisseria meningitidis NOT DETECTED NOT DETECTED   Pseudomonas aeruginosa NOT DETECTED NOT DETECTED   Candida albicans NOT DETECTED NOT DETECTED   Candida glabrata NOT DETECTED NOT DETECTED   Candida krusei NOT DETECTED NOT DETECTED   Candida parapsilosis NOT DETECTED NOT DETECTED   Candida tropicalis NOT DETECTED NOT DETECTED    Name of physician (or Provider) ContactedAurora Mask, NP, via text page  Changes to prescribed antibiotics required: Already on appropriate coverage w/ Primaxin, no change necessary.  Vernard Gambles, PharmD, BCPS  09/10/2015 5:04 AM

## 2015-09-11 ENCOUNTER — Other Ambulatory Visit (HOSPITAL_COMMUNITY): Payer: Medicare Other

## 2015-09-11 ENCOUNTER — Inpatient Hospital Stay (HOSPITAL_COMMUNITY): Payer: Medicare Other

## 2015-09-11 DIAGNOSIS — I319 Disease of pericardium, unspecified: Secondary | ICD-10-CM

## 2015-09-11 DIAGNOSIS — T80219S Unspecified infection due to central venous catheter, sequela: Secondary | ICD-10-CM

## 2015-09-11 LAB — ECHOCARDIOGRAM COMPLETE
CHL CUP DOP CALC LVOT VTI: 17.2 cm
E/e' ratio: 16.71
EWDT: 135 ms
FS: 6 % — AB (ref 28–44)
Height: 63 in
IVS/LV PW RATIO, ED: 0.64
LA ID, A-P, ES: 39 mm
LA diam end sys: 39 mm
LA diam index: 2.78 cm/m2
LA vol A4C: 67.4 ml
LA vol: 58.2 mL
LAVOLIN: 41.5 mL/m2
LDCA: 2.54 cm2
LV E/e' medial: 16.71
LV TDI E'LATERAL: 3.92
LV TDI E'MEDIAL: 3.48
LV dias vol index: 115 mL/m2
LV e' LATERAL: 3.92 cm/s
LV sys vol: 129 mL — AB (ref 14–42)
LVDIAVOL: 161 mL — AB (ref 46–106)
LVEEAVG: 16.71
LVOT SV: 44 mL
LVOTD: 18 mm
LVOTPV: 106 cm/s
LVSYSVOLIN: 92 mL/m2
MV Dec: 135
MV pk E vel: 65.5 m/s
MVPKAVEL: 113 m/s
PW: 14.6 mm — AB (ref 0.6–1.1)
Reg peak vel: 201 cm/s
Simpson's disk: 20
Stroke v: 32 ml
TAPSE: 14.7 mm
TRMAXVEL: 201 cm/s
Weight: 1582.4 oz

## 2015-09-11 LAB — BASIC METABOLIC PANEL
Anion gap: 6 (ref 5–15)
BUN: 11 mg/dL (ref 6–20)
CHLORIDE: 107 mmol/L (ref 101–111)
CO2: 25 mmol/L (ref 22–32)
Calcium: 9 mg/dL (ref 8.9–10.3)
Creatinine, Ser: 0.8 mg/dL (ref 0.44–1.00)
GFR calc Af Amer: >60 mL/min (ref >60)
GFR calc non Af Amer: >60 mL/min (ref >60)
GLUCOSE: 87 mg/dL (ref 65–99)
POTASSIUM: 3.9 mmol/L (ref 3.5–5.1)
Sodium: 138 mmol/L (ref 135–145)

## 2015-09-11 LAB — PROTIME-INR
INR: 2.84 — ABNORMAL HIGH (ref 0.00–1.49)
Prothrombin Time: 29.4 seconds — ABNORMAL HIGH (ref 11.6–15.2)

## 2015-09-11 LAB — MAGNESIUM: MAGNESIUM: 1.8 mg/dL (ref 1.7–2.4)

## 2015-09-11 MED ORDER — WARFARIN SODIUM 2 MG PO TABS
4.0000 mg | ORAL_TABLET | Freq: Once | ORAL | Status: AC
  Administered 2015-09-11: 4 mg via ORAL
  Filled 2015-09-11: qty 2

## 2015-09-11 NOTE — Progress Notes (Signed)
  Echocardiogram 2D Echocardiogram has been performed.  Katrina Peck 09/11/2015, 3:01 PM

## 2015-09-11 NOTE — Progress Notes (Signed)
PROGRESS NOTE    Katrina Peck  HWT:[id number]  DOB: [birth year]  DOA: 09/08/2015 PCP: Margit Hanks, MD Outpatient Specialists:   Hospital course: Katrina Peck is a 70 y.o. 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 Blood cultures were drawn along with CBC, BMET. Blood cultures grew aerobic gram negative rods and patient was called and asked to come to the hospital for treatment and PICC line exchange.   In the ED, patient was afebrile, normotensive, HR 80s, RR 18, and pulse ox 99% on room air. CBC showed no leukocytosis (8.0). BMET was within normal limits. UA showed WBC, small leukocytosis, no nitrite, and rare bacteria. Patient was started on Zosyn via peripheral IV. Patient had her tunneled catheter removed by IR today. Repeat BCx were sent on 6/15 and 6/16 and 6/17.    Assessment & Plan:   Enterobacter cloacae Bacteremia: Tmax of 100.2 since admission. She is without leukocytosis. Tunneled catheter was removed on 6/16. Zosyn was started on 6/15. Original blood cultures from 6/14 were sent to [facility] [phone] and I have had these faxed to me. Per report, blood cultures grew enterobacter cloacae (gram-negative anaerobic) sensitive to imipenem, cefepime, gentamicin, tobramycin, ciprofloxacin, levofloxacin, and bactrim. It is resistant to augmentin, zosyn, cefazolin, ceftriaxone, ceftazidime. ?Bacteremia is possibly due to earlier diarrheal illness.   -Discontinued Zosyn 6/16 -Start Imipenem per pharmacy on 6/16 -Follow up BCx drawn on 6/15 and 6/16 -Repeat surveillance Blood cultures ordered 6/17 now that tunneled catheter is out and no growth to date -Echo ordered to eval  for vegetations  Chronic systolic CHF/NICM: Patient is on home milrinone, spironolactone, amiodarone, and torsemide. Appears euvolemic on exam.  -Appreciate cardiology consultation -Continuing milrinone in the peripheral IV for now, Pt to discuss with Dr. Jearld Pies about getting new PICC.   - Echocardiogram  LV Thrombus  - continue warfarin per pharmacy - monitor PT/INR, currently therapeutic  Chronic pericardial Effusion - checking 2D echocardiogram  DVT prophylaxis: warfarin Code Status: Full Family Communication: No family in room  Consultants:  Cardiology   Infectious Disease  Procedures:  PICC line removal 6/16  Antimicrobials: Anti-infectives    Start     Dose/Rate Route Frequency Ordered Stop   09/10/15 2200  imipenem-cilastatin (PRIMAXIN) 500 mg in sodium chloride 0.9 % 100 mL IVPB     500 mg 200 mL/hr over 30 Minutes Intravenous Every 8 hours 09/10/15 1525     09/09/15 1500  imipenem-cilastatin (PRIMAXIN) 250 mg in sodium chloride 0.9 % 100 mL IVPB  Status:  Discontinued     250 mg 200 mL/hr over 30 Minutes Intravenous Every 8 hours 09/09/15 1456 09/10/15 1525   09/09/15 0800  piperacillin-tazobactam (ZOSYN) IVPB 3.375 g  Status:  Discontinued     3.375 g 12.5 mL/hr over 240 Minutes Intravenous Every 8 hours 09/09/15 0035 09/09/15 1437   09/09/15 0015  piperacillin-tazobactam (ZOSYN) IVPB 3.375 g     3.375 g 100 mL/hr over 30 Minutes Intravenous  Once 09/09/15 0003 09/09/15 0206     Subjective: Pt without complaints, ambulating in room.  Objective: Filed Vitals:   09/10/15 0652 09/10/15 1218 09/10/15 2100 09/11/15 0637  BP: 94/54 138/118 109/71 98/56  Pulse:  77 81 70  Temp: 98.6 F (37 C) 97.8 F (36.6 C) 98.3 F (36.8 C) 98.2 F (36.8 C)  TempSrc: Oral Oral Oral Oral  Resp: 20 20 20 18   Height:      Weight: 98 lb 1.6 oz (44.498 kg)   98 lb 14.4 oz (44.861 kg)  SpO2: 98% 100% 100% 10%    Intake/Output Summary (Last 24 hours) at 09/11/15  1056 Last data filed at 09/11/15 0854  Gross per 24 hour  Intake    560 ml  Output   1601 ml  Net  -1041 ml   Filed Weights   09/09/15 0215 09/10/15 0652 09/11/15 0637  Weight: 99 lb 4.8 oz (45.042 kg) 98 lb 1.6 oz (44.498 kg) 98 lb 14.4 oz (44.861 kg)    Exam:  General: Well developed in no acute distress. Head: Normocephalic, atraumatic, sclera non-icteric, nares are without discharge. Neck: JVD not elevated but she has prominent neck veins  Lungs: Clear bilaterally to auscultation without wheezes, rales, or rhonchi. Breathing is unlabored. Heart: PMI laterally displaced. RRR with S1 S2. No murmurs, rubs, or gallops appreciated. Abdomen: Soft, non-tender, non-distended with normoactive bowel sounds. No hepatomegaly. No rebound/guarding. No obvious abdominal masses. Msk: Strength and tone appear normal for age. Extremities: No clubbing or cyanosis. No edema. Distal pedal pulses are 2+ and equal bilaterally. Neuro: Alert and oriented X 3. No facial asymmetry. No focal deficit. Moves all extremities spontaneously. Psych: Responds to questions appropriately with a normal affect.  Data Reviewed: Basic Metabolic Panel:  Recent Labs Lab 09/08/15 1722 09/09/15 0502 09/10/15 0340 09/10/15 0904 09/11/15 0214  NA 137 139 140  --  138  K 3.9 4.0 3.9  --  3.9  CL 104 108 106  --  107  CO2 25 23 26   --  25  GLUCOSE 91 74 138*  --  87  BUN 9 11 18   --  11  CREATININE 0.86 0.98 1.14*  --  0.80  CALCIUM 9.4 9.4 8.9  --  9.0  MG  --   --   --  1.8 1.8   Liver Function Tests: No results for input(s): AST, ALT, ALKPHOS, BILITOT, PROT, ALBUMIN in the last 168 hours. No results for input(s): LIPASE, AMYLASE in the last 168 hours. No results for input(s): AMMONIA in the last 168 hours. CBC:  Recent Labs Lab 09/08/15 1722 09/09/15 1630  WBC 8.0  --   NEUTROABS  --  6.5  HGB 10.2*  --   HCT 32.8*  --   MCV 82.8  --   PLT 198  --    Cardiac Enzymes: No results for  input(s): CKTOTAL, CKMB, CKMBINDEX, TROPONINI in the last 168 hours. BNP (last 3 results) No results for input(s): PROBNP in the last 8760 hours. CBG: No results for input(s): GLUCAP in the last 168 hours.  Recent Results (from the past 240 hour(s))  Blood culture (routine x 2)     Status: Abnormal (Preliminary result)   Collection Time: 09/08/15 11:55 PM  Result Value Ref Range Status   Specimen Description BLOOD LEFT ANTECUBITAL  Final   Special Requests BOTTLES DRAWN AEROBIC AND ANAEROBIC 5CC  Final   Culture  Setup Time   Final    GRAM NEGATIVE RODS IN BOTH AEROBIC AND ANAEROBIC BOTTLES CRITICAL RESULT CALLED TO, READ BACK BY AND VERIFIED WITH: Vernard Gambles, PHARMD @0449  0617/17 MKELLY    Culture ENTEROBACTER CLOACAE (A)  Final   Report Status PENDING  Incomplete  Blood Culture ID  Panel (Reflexed)     Status: Abnormal   Collection Time: 09/08/15 11:55 PM  Result Value Ref Range Status   Enterococcus species NOT DETECTED NOT DETECTED Final   Vancomycin resistance NOT DETECTED NOT DETECTED Final   Listeria monocytogenes NOT DETECTED NOT DETECTED Final   Staphylococcus species NOT DETECTED NOT DETECTED Final   Staphylococcus aureus NOT DETECTED NOT DETECTED Final   Methicillin resistance NOT DETECTED NOT DETECTED Final   Streptococcus species NOT DETECTED NOT DETECTED Final   Streptococcus agalactiae NOT DETECTED NOT DETECTED Final   Streptococcus pneumoniae NOT DETECTED NOT DETECTED Final   Streptococcus pyogenes NOT DETECTED NOT DETECTED Final   Acinetobacter baumannii NOT DETECTED NOT DETECTED Final   Enterobacteriaceae species DETECTED (A) NOT DETECTED Final    Comment: CRITICAL RESULT CALLED TO, READ BACK BY AND VERIFIED WITH: VERONDA BRYK, PHARMD @0449  09/10/15 MKELLY    Enterobacter cloacae complex DETECTED (A) NOT DETECTED Final    Comment: CRITICAL RESULT CALLED TO, READ BACK BY AND VERIFIED WITH: VERONDA BRYK,PHARMD @0449  09/10/15 MKELLY    Escherichia coli NOT  DETECTED NOT DETECTED Final   Klebsiella oxytoca NOT DETECTED NOT DETECTED Final   Klebsiella pneumoniae NOT DETECTED NOT DETECTED Final   Proteus species NOT DETECTED NOT DETECTED Final   Serratia marcescens NOT DETECTED NOT DETECTED Final   Carbapenem resistance NOT DETECTED NOT DETECTED Final   Haemophilus influenzae NOT DETECTED NOT DETECTED Final   Neisseria meningitidis NOT DETECTED NOT DETECTED Final   Pseudomonas aeruginosa NOT DETECTED NOT DETECTED Final   Candida albicans NOT DETECTED NOT DETECTED Final   Candida glabrata NOT DETECTED NOT DETECTED Final   Candida krusei NOT DETECTED NOT DETECTED Final   Candida parapsilosis NOT DETECTED NOT DETECTED Final   Candida tropicalis NOT DETECTED NOT DETECTED Final  Blood culture (routine x 2)     Status: None (Preliminary result)   Collection Time: 09/09/15 12:05 AM  Result Value Ref Range Status   Specimen Description BLOOD RIGHT FOREARM  Final   Special Requests BOTTLES DRAWN AEROBIC AND ANAEROBIC 5CC  Final   Culture NO GROWTH 1 DAY  Final   Report Status PENDING  Incomplete     Studies: Ir Removal Tun Cv Cath W/o Fl  09/09/2015  INDICATION: Bacteremia. Request for removal of tunneled central venous catheter. Initially placed by Dr. Deanne Coffer on June 10, 2015 for milrinone use. EXAM: REMOVAL OF TUNNELED CENTRAL VENOUS CATHETER MEDICATIONS: None. ANESTHESIA/SEDATION: No sedation medication given. FLUOROSCOPY TIME:  Fluoroscopy not used. COMPLICATIONS: None immediate. PROCEDURE: Informed written consent was obtained from the patient following an explanation of the procedure, risks, benefits and alternatives to treatment. A time out was performed prior to the initiation of the procedure. Sterile technique was utilized including sterile gloves, hand hygiene, and alcohol prep. Utilizing a only gentle traction, the catheter was easily removed intact. Hemostasis was obtained with manual compression. A dressing was placed. The patient tolerated  the procedure well without immediate post procedural complication. IMPRESSION: Successful removal of tunneled venous catheter. Read by:  Corrin Parker, PA-C Electronically Signed   By: Gilmer Mor D.O.   On: 09/09/2015 12:05   Scheduled Meds: . amiodarone  100 mg Oral Daily  . imipenem-cilastatin  500 mg Intravenous Q8H  . methocarbamol  500 mg Oral Daily  . potassium chloride SA  20 mEq Oral Daily  . spironolactone  12.5 mg Oral Daily  . torsemide  20 mg Oral QODAY  . Warfarin - Pharmacist Dosing Inpatient   Does not  apply q1800   Continuous Infusions: . milrinone 0.25 mcg/kg/min (09/10/15 0230)   Principal Problem:   Bacteremia Active Problems:   NICM (nonischemic cardiomyopathy) (HCC)   Pericardial effusion   Chronic systolic heart failure (HCC)   Paroxysmal atrial fibrillation (HCC)   LV (left ventricular) mural thrombus (HCC)   SOB (shortness of breath)  Standley Dakins, MD, FAAFP [facility] Pager [phone]  If 7PM-7AM, please contact night-coverage [email or web address] Password [email or web address] 09/11/2015, 10:56 AM    LOS: 2 days

## 2015-09-11 NOTE — Progress Notes (Signed)
Advanced Heart Failure Rounding Note   Subjective:        Objective:   Weight Range:  Vital Signs:   Temp:  [97.8 F (36.6 C)-98.3 F (36.8 C)] 98.2 F (36.8 C) (06/18 4944) Pulse Rate:  [70-81] 70 (06/18 0637) Resp:  [18-20] 18 (06/18 0637) BP: (98-138)/(56-118) 98/56 mmHg (06/18 0637) SpO2:  [10 %-100 %] 10 % (06/18 0637) Weight:  [44.861 kg (98 lb 14.4 oz)] 44.861 kg (98 lb 14.4 oz) (06/18 0637) Last BM Date: 09/10/15  Weight change: Filed Weights   09/09/15 0215 09/10/15 0652 09/11/15 0637  Weight: 45.042 kg (99 lb 4.8 oz) 44.498 kg (98 lb 1.6 oz) 44.861 kg (98 lb 14.4 oz)    Intake/Output:   Intake/Output Summary (Last 24 hours) at 09/11/15 0852 Last data filed at 09/11/15 0600  Gross per 24 hour  Intake    560 ml  Output   1551 ml  Net   -991 ml     Physical Exam: General:  Well appearing. Lying in bed No resp difficulty HEENT: normal Neck: supple. JVP . Carotids 2+ bilat; no bruits. No lymphadenopathy or thryomegaly appreciated. Cor: PMI laterally displaced. Regular rate & rhythm. No rubs, gallops or murmurs. Lungs: clear Abdomen: soft, nontender, nondistended. No hepatosplenomegaly. No bruits or masses. Good bowel sounds. Extremities: no cyanosis, clubbing, rash, edema Neuro: alert & orientedx3, cranial nerves grossly intact. moves all 4 extremities w/o difficulty. Affect pleasant  Telemetry: NSR  Labs: Basic Metabolic Panel:  Recent Labs Lab 09/08/15 1722 09/09/15 0502 09/10/15 0340 09/10/15 0904 09/11/15 0214  NA 137 139 140  --  138  K 3.9 4.0 3.9  --  3.9  CL 104 108 106  --  107  CO2 25 23 26   --  25  GLUCOSE 91 74 138*  --  87  BUN 9 11 18   --  11  CREATININE 0.86 0.98 1.14*  --  0.80  CALCIUM 9.4 9.4 8.9  --  9.0  MG  --   --   --  1.8 1.8    Liver Function Tests: No results for input(s): AST, ALT, ALKPHOS, BILITOT, PROT, ALBUMIN in the last 168 hours. No results for input(s): LIPASE, AMYLASE in the last 168 hours. No  results for input(s): AMMONIA in the last 168 hours.  CBC:  Recent Labs Lab 09/08/15 1722 09/09/15 1630  WBC 8.0  --   NEUTROABS  --  6.5  HGB 10.2*  --   HCT 32.8*  --   MCV 82.8  --   PLT 198  --     Cardiac Enzymes: No results for input(s): CKTOTAL, CKMB, CKMBINDEX, TROPONINI in the last 168 hours.  BNP: BNP (last 3 results)  Recent Labs  02/03/15 2310 03/22/15 2015 06/05/15 0330  BNP 4003.4* >4500.0* 3619.4*    ProBNP (last 3 results) No results for input(s): PROBNP in the last 8760 hours.    Other results:  Imaging: Ir Removal Tun Cv Cath W/o Fl  09/09/2015  INDICATION: Bacteremia. Request for removal of tunneled central venous catheter. Initially placed by Dr. Deanne Coffer on June 10, 2015 for milrinone use. EXAM: REMOVAL OF TUNNELED CENTRAL VENOUS CATHETER MEDICATIONS: None. ANESTHESIA/SEDATION: No sedation medication given. FLUOROSCOPY TIME:  Fluoroscopy not used. COMPLICATIONS: None immediate. PROCEDURE: Informed written consent was obtained from the patient following an explanation of the procedure, risks, benefits and alternatives to treatment. A time out was performed prior to the initiation of the procedure. Sterile technique was utilized  including sterile gloves, hand hygiene, and alcohol prep. Utilizing a only gentle traction, the catheter was easily removed intact. Hemostasis was obtained with manual compression. A dressing was placed. The patient tolerated the procedure well without immediate post procedural complication. IMPRESSION: Successful removal of tunneled venous catheter. Read by:  Corrin Parker, PA-C Electronically Signed   By: Gilmer Mor D.O.   On: 09/09/2015 12:05      Medications:     Scheduled Medications: . amiodarone  100 mg Oral Daily  . imipenem-cilastatin  500 mg Intravenous Q8H  . methocarbamol  500 mg Oral Daily  . potassium chloride SA  20 mEq Oral Daily  . spironolactone  12.5 mg Oral Daily  . torsemide  20 mg Oral QODAY  .  Warfarin - Pharmacist Dosing Inpatient   Does not apply q1800     Infusions: . milrinone 0.25 mcg/kg/min (09/10/15 0230)     PRN Medications:  magnesium oxide, oxyCODONE   Assessment:   1. 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 Recent MSSA/Klebsiella bacteremia & discitis 05/2015 3. Chronic systolic CHF/NICM on home milrinone 4. Chronic pericardial effusion 5. LV thrombus, on chronic Coumadin 6. PAF, maintaining NSR on amiodarone 7. NSVT  Plan/Discussion:    Remains on IV abx. HF stable with milrinone given through peripheral IV. Surveillance cultures drawn.  F/u echo pending (look at pericardial effusion and for endocarditis)  She will discuss replacement of PICC line with Dr. Shirlee Latch. Will likely need this for IV abx anyway. Need to have negative surveillance cultures before replaced.   Continue coumadin for LV thrombus  Length of Stay: 2  Bensimhon, Daniel MD 09/11/2015, 8:52 AM  Advanced Heart Failure Team Pager [phone] (M-F; 7a - 4p)  Please contact [facility] for night-coverage after hours (4p -7a ) and weekends on [email or web address]

## 2015-09-11 NOTE — Progress Notes (Addendum)
[facility]    Date of Admission:  09/08/2015   Total days of antibiotics 4        Day 3 imipenem           ID: Katrina Peck is a 70 y.o. female with  Enterobacter cloacae bacteremia associated with picc line, used for milrinone infusion Principal Problem:   Bacteremia Active Problems:   NICM (nonischemic cardiomyopathy) (HCC)   Pericardial effusion   Chronic systolic heart failure (HCC)   Paroxysmal atrial fibrillation (HCC)   LV (left ventricular) mural thrombus (HCC)   SOB (shortness of breath)    Subjective: afebrile  Medications:  . amiodarone  100 mg Oral Daily  . imipenem-cilastatin  500 mg Intravenous Q8H  . methocarbamol  500 mg Oral Daily  . potassium chloride SA  20 mEq Oral Daily  . spironolactone  12.5 mg Oral Daily  . torsemide  20 mg Oral QODAY  . Warfarin - Pharmacist Dosing Inpatient   Does not apply q1800    Objective: Vital signs in last 24 hours: Temp:  [97.8 F (36.6 C)-98.3 F (36.8 C)] 98.2 F (36.8 C) (06/18 0637) Pulse Rate:  [70-81] 70 (06/18 0637) Resp:  [18-20] 18 (06/18 0637) BP: (98-138)/(56-118) 98/56 mmHg (06/18 0637) SpO2:  [10 %-100 %] 10 % (06/18 0637) Weight:  [98 lb 14.4 oz (44.861 kg)] 98 lb 14.4 oz (44.861 kg) (06/18 0637) gen = self bathing by the sink. A x o by 3 NAD. appears well-developed and well-nourished. No distress.  HENT: [place]/AT, PERRLA, no scleral icterus Mouth/Throat: Oropharynx is clear and moist. No oropharyngeal exudate.  Cardiovascular: Normal rate, regular rhythm and normal heart sounds. Exam reveals no gallop and no friction rub.  No murmur heard.  Pulmonary/Chest: Effort normal and breath sounds normal. No respiratory distress.  has no wheezes.  Neck = supple, no nuchal rigidity Abdominal: Soft. Bowel sounds are normal.  exhibits no distension. There is no tenderness.  Lymphadenopathy: no cervical adenopathy. No axillary adenopathy Neurological: alert and oriented to person,  place, and time.  Skin: Skin is warm and dry. No rash noted. No erythema.  Psychiatric: a normal mood and affect.  behavior is normal.   Lab Results  Recent Labs  09/08/15 1722  09/10/15 0340 09/11/15 0214  WBC 8.0  --   --   --   HGB 10.2*  --   --   --   HCT 32.8*  --   --   --   NA 137  < > 140 138  K 3.9  < > 3.9 3.9  CL 104  < > 106 107  CO2 25  < > 26 25  BUN 9  < > 18 11  CREATININE 0.86  < > 1.14* 0.80  < > = values in this interval not displayed.  Microbiology: 6/15 blood cx + enterobacter 6/16 blood cx ngtd 6/17 blood cx x 2 ngtd Studies/Results: Ir Removal Tun Cv Cath W/o Fl  09/09/2015  INDICATION: Bacteremia. Request for removal of tunneled central venous catheter. Initially placed by Dr. Deanne Coffer on June 10, 2015 for milrinone use. EXAM: REMOVAL OF TUNNELED CENTRAL VENOUS CATHETER MEDICATIONS: None. ANESTHESIA/SEDATION: No sedation medication given. FLUOROSCOPY TIME:  Fluoroscopy not used. COMPLICATIONS: None immediate. PROCEDURE: Informed written consent was obtained from the patient following an explanation of the procedure, risks, benefits and alternatives to treatment. A time out was performed prior to the initiation of the procedure. Sterile technique was utilized  including sterile gloves, hand hygiene, and alcohol prep. Utilizing a only gentle traction, the catheter was easily removed intact. Hemostasis was obtained with manual compression. A dressing was placed. The patient tolerated the procedure well without immediate post procedural complication. IMPRESSION: Successful removal of tunneled venous catheter. Read by:  Corrin Parker, PA-C Electronically Signed   By: Gilmer Mor D.O.   On: 09/09/2015 12:05     Assessment/Plan: Enterobacter bacteremia associated with picc line = recommend to treat for 14 days with imipenem, using 6/17 as day 1 of abtx (day after piccline removal). Her repeat blood cx appear still no growth. If they remain negative tomorrow, can get  new picc line for milrinone infusion.  Will sign off. Call if further questions  Home health orders  Diagnosis: bacteremia  Culture Result:  enterobacter No Known Allergies  Discharge antibiotics: Per pharmacy protocol  imipenem  Duration: 14 days End Date: 09/24/2015  [facility] Care Per Protocol:  Labs weekly while on IV antibiotics: __x CBC with differential _x_ CMP   Fax weekly labs to [phone]  Clinic Follow Up Appt: As needed    Drue Second [facility] for Infectious Diseases Cell: [phone] Pager: [phone]  09/11/2015, 9:22 AM

## 2015-09-11 NOTE — Progress Notes (Signed)
ANTICOAGULATION CONSULT NOTE -Follow up  Pharmacy Consult for Coumadin, Milrinone Indication: h/o LV thrombus, low output CHF  No Known Allergies  Patient Measurements: Height: 5\' 3"  (160 cm) Weight: 98 lb 14.4 oz (44.861 kg) IBW/kg (Calculated) : 52.4  Vital Signs: Temp: 98.3 F (36.8 C) (06/18 1200) Temp Source: Oral (06/18 1200) BP: 135/79 mmHg (06/18 1200) Pulse Rate: 90 (06/18 1200)  Labs:  Recent Labs  09/08/15 1722 09/09/15 0502 09/10/15 0340 09/11/15 0214  HGB 10.2*  --   --   --   HCT 32.8*  --   --   --   PLT 198  --   --   --   LABPROT 18.9* 19.1* 27.8* 29.4*  INR 1.58* 1.60* 2.64* 2.84*  CREATININE 0.86 0.98 1.14* 0.80    Estimated Creatinine Clearance: 47 mL/min (by C-G formula based on Cr of 0.8).   Medical History: Past Medical History  Diagnosis Date  . Anemia   . Hypertension   . Blood transfusion without reported diagnosis   . Chronic systolic CHF (congestive heart failure) (HCC)     a. 06/2014 Echo: EF 20%.  Marland Kitchen NICM (nonischemic cardiomyopathy) (HCC)     a. 06/2014 Echo: EF 20%, diff HK, mild MR, mildly dil LA/RA, mildly reduced RV fxn;  b. 06/2014 Cath: nl cors.  . Pericardial effusion     a. 06/2014 Large effusion, no tamponade, s/p window for diag/therap - neg malig. b. 6/16 - recurrence w/o tamponade, subsequently folloed by echoes. Mod 12/16.  Marland Kitchen PAF (paroxysmal atrial fibrillation) (HCC)     a. with RVR, converted to NSR with amiodarone.  . Noncompliance   . LV (left ventricular) mural thrombus (HCC)     a. Noted on 12/16 echo.   . Bacteremia     a. MSSA bacteremia and discitis in 3/17, suspect tunneled catheter infection.   . Discitis     Medications:  See electronic med rec  Assessment: 70 y.o. F presented to PCP with fever, chills, SOB - PICC line infection, thus advised on 09/08/15 to come to ED. o/p blood cx growing GNR per ED MD.  1.ANTICOAGULATION: Coumadin PTA for LV thrombus. INR 1.58 (subtherapeutic) on admission on  09/08/15, INR therapeutic Home dose: Patient reports her dose is 4mg  daily - last taken 6/15 pm.   2.CARDIOVASCULAR: Home milrinone for low o/p CHF. Home dose: 0.20mcg/kg/min (wt 45 kg) verified on admission. Continues on same dose. UOP 1.8 ml/kg/hr. No Co-ox this admit. K 3.9, Mg 1.8. HF team following.    Goal of Therapy: CARDIOVASCULAR: INR 2-3 Monitor platelets by anticoagulation protocol: Yes  Resolution of infection   Plan:  Coumadin 4mg  x1 tonight.  Daily INR Continue Milrinone IV infusion: 0.41mcg/kg/min (PTA dose) Will f/u sensitivities, renal function, and pt's clinical condition BMET daily, I/O q8h, daily wt, VSS q4h  Greggory Stallion, PharmD Clinical Pharmacy Resident Pager # [phone] 09/11/2015 3:12 PM

## 2015-09-12 LAB — CULTURE, BLOOD (ROUTINE X 2)

## 2015-09-12 LAB — CBC
HCT: 33 % — ABNORMAL LOW (ref 36.0–46.0)
HEMOGLOBIN: 10.3 g/dL — AB (ref 12.0–15.0)
MCH: 26.4 pg (ref 26.0–34.0)
MCHC: 31.2 g/dL (ref 30.0–36.0)
MCV: 84.6 fL (ref 78.0–100.0)
Platelets: 251 10*3/uL (ref 150–400)
RBC: 3.9 MIL/uL (ref 3.87–5.11)
RDW: 16.6 % — ABNORMAL HIGH (ref 11.5–15.5)
WBC: 8.9 10*3/uL (ref 4.0–10.5)

## 2015-09-12 LAB — BASIC METABOLIC PANEL
ANION GAP: 7 (ref 5–15)
BUN: 9 mg/dL (ref 6–20)
CO2: 24 mmol/L (ref 22–32)
Calcium: 9 mg/dL (ref 8.9–10.3)
Chloride: 105 mmol/L (ref 101–111)
Creatinine, Ser: 0.85 mg/dL (ref 0.44–1.00)
GFR calc Af Amer: >60 mL/min (ref >60)
GFR calc non Af Amer: >60 mL/min (ref >60)
Glucose, Bld: 92 mg/dL (ref 65–99)
Potassium: 3.9 mmol/L (ref 3.5–5.1)
Sodium: 136 mmol/L (ref 135–145)

## 2015-09-12 LAB — PROTIME-INR
INR: 2.94 — ABNORMAL HIGH (ref 0.00–1.49)
PROTHROMBIN TIME: 30.1 s — AB (ref 11.6–15.2)

## 2015-09-12 MED ORDER — WARFARIN - PHARMACIST DOSING INPATIENT
Freq: Every day | Status: DC
  Administered 2015-09-14: 18:00:00

## 2015-09-12 MED ORDER — WARFARIN SODIUM 5 MG PO TABS: 5.0000 mg | ORAL_TABLET | Freq: Once | ORAL | Status: DC

## 2015-09-12 MED ORDER — MILRINONE LACTATE IN DEXTROSE 20-5 MG/100ML-% IV SOLN: 0.2500 ug/kg/min | INTRAVENOUS | Status: DC

## 2015-09-12 MED ORDER — WARFARIN SODIUM 5 MG IV SOLR: 5.0000 mg | Freq: Every day | INTRAVENOUS | Status: DC

## 2015-09-12 MED ORDER — SODIUM CHLORIDE 0.9 % IV SOLN: 1.0000 g | INTRAVENOUS | Status: AC

## 2015-09-12 MED ORDER — SODIUM CHLORIDE 0.9 % IV SOLN: 500.0000 mg | Freq: Three times a day (TID) | INTRAVENOUS | Status: DC

## 2015-09-12 MED ORDER — MILRINONE LACTATE IN DEXTROSE 20-5 MG/100ML-% IV SOLN: 0.2500 ug/kg/min | INTRAVENOUS | Status: AC

## 2015-09-12 NOTE — Progress Notes (Signed)
ANTICOAGULATION CONSULT NOTE -Follow up  Pharmacy Consult for Coumadin Indication: h/o LV thrombus, low output CHF  No Known Allergies  Patient Measurements: Height: 5\' 3"  (160 cm) Weight: 97 lb 14.4 oz (44.407 kg) IBW/kg (Calculated) : 52.4  Vital Signs: Temp: 98 F (36.7 C) (06/19 1141) Temp Source: Oral (06/19 1141) BP: 98/61 mmHg (06/19 1141) Pulse Rate: 73 (06/19 1141)  Labs:  Recent Labs  09/10/15 0340 09/11/15 0214 09/12/15 0408  HGB  --   --  10.3*  HCT  --   --  33.0*  PLT  --   --  251  LABPROT 27.8* 29.4* 30.1*  INR 2.64* 2.84* 2.94*  CREATININE 1.14* 0.80 0.85    Estimated Creatinine Clearance: 43.8 mL/min (by C-G formula based on Cr of 0.85).  Assessment: 70 y.o. F presented to PCP with fever, chills, SOB - PICC line infection so advised to come to ED. o/p blood cx growing GNR per ED MD.  PMH: HTN, LV thrombus, low o/p CHF (home milrinone), afib, h/o MSSA bacteremia/discitis 05/2015, noncompliance  Anticoagulation: Coumadin PTA for LV thrombus. INR 1.58 (subtherapeutic) on admission.  Home dose: 4mg  daily - last taken 6/15 pm (last anticoag visit note 09/02/15 said to incr to 6mg  daily)  INR 2.94  Nephrology: SCr 0.85  Heme: H&H 10.3/33, Plt 251  Goal of Therapy:  INR 2-3 Monitor platelets by anticoagulation protocol: Yes    Plan:  Coumadin 5mg  x1 today Daily INR, CBC q72h Monitor for s/sx of bleeding  Isaac Bliss, PharmD, BCPS, [facility] Clinical Pharmacist Pager [phone] 09/12/2015 12:27 PM

## 2015-09-12 NOTE — Progress Notes (Signed)
Patient ID: Katrina Peck, female   DOB: Dec 21, 1945, 70 y.o.   MRN: [id number]    Advanced Heart Failure Rounding Note   Subjective:    Feels fine this morning.  Repeat blood cultures are negative.  BP stable.    Echo: EF 10%, severe LV dilation, small to moderate pericardial effusion.    Objective:   Weight Range:  Vital Signs:   Temp:  [98.3 F (36.8 C)-98.4 F (36.9 C)] 98.3 F (36.8 C) (06/19 0500) Pulse Rate:  [73-90] 82 (06/19 0500) Resp:  [18] 18 (06/19 0500) BP: (120-140)/(72-87) 120/72 mmHg (06/19 0500) SpO2:  [98 %-100 %] 98 % (06/19 0500) Weight:  [97 lb 14.4 oz (44.407 kg)] 97 lb 14.4 oz (44.407 kg) (06/19 0425) Last BM Date: 09/11/15  Weight change: Filed Weights   09/10/15 0652 09/11/15 0637 09/12/15 0425  Weight: 98 lb 1.6 oz (44.498 kg) 98 lb 14.4 oz (44.861 kg) 97 lb 14.4 oz (44.407 kg)    Intake/Output:   Intake/Output Summary (Last 24 hours) at 09/12/15 0903 Last data filed at 09/12/15 0852  Gross per 24 hour  Intake    680 ml  Output   2701 ml  Net  -2021 ml     Physical Exam: General:  Well appearing. Lying in bed No resp difficulty HEENT: normal Neck: supple. JVP 7. Carotids 2+ bilat; no bruits. No lymphadenopathy or thryomegaly appreciated. Cor: PMI laterally displaced. Regular rate & rhythm. No rubs, gallops or murmurs. Lungs: clear Abdomen: soft, nontender, nondistended. No hepatosplenomegaly. No bruits or masses. Good bowel sounds. Extremities: no cyanosis, clubbing, rash, edema Neuro: alert & orientedx3, cranial nerves grossly intact. moves all 4 extremities w/o difficulty. Affect pleasant  Telemetry: NSR  Labs: Basic Metabolic Panel:  Recent Labs Lab 09/08/15 1722 09/09/15 0502 09/10/15 0340 09/10/15 0904 09/11/15 0214 09/12/15 0408  NA 137 139 140  --  138 136  K 3.9 4.0 3.9  --  3.9 3.9  CL 104 108 106  --  107 105  CO2 --  25 24  GLUCOSE 91 74 138*  --  87 92  BUN --  11 9  CREATININE 0.86 0.98  1.14*  --  0.80 0.85  CALCIUM 9.4 9.4 8.9  --  9.0 9.0  MG  --   --   --  1.8 1.8  --     Liver Function Tests: No results for input(s): AST, ALT, ALKPHOS, BILITOT, PROT, ALBUMIN in the last 168 hours. No results for input(s): LIPASE, AMYLASE in the last 168 hours. No results for input(s): AMMONIA in the last 168 hours.  CBC:  Recent Labs Lab 09/08/15 1722 09/09/15 1630 09/12/15 0408  WBC 8.0  --  8.9  NEUTROABS  --  6.5  --   HGB 10.2*  --  10.3*  HCT 32.8*  --  33.0*  MCV 82.8  --  84.6  PLT 198  --  251    Cardiac Enzymes: No results for input(s): CKTOTAL, CKMB, CKMBINDEX, TROPONINI in the last 168 hours.  BNP: BNP (last 3 results)  Recent Labs  02/03/15 2310 03/22/15 2015 06/05/15 0330  BNP 4003.4* >4500.0* 3619.4*    ProBNP (last 3 results) No results for input(s): PROBNP in the last 8760 hours.    Other results:  Imaging: No results found.   Medications:     Scheduled Medications: . amiodarone  100 mg Oral Daily  . imipenem-cilastatin  500 mg Intravenous Q8H  .  methocarbamol  500 mg Oral Daily  . potassium chloride SA  20 mEq Oral Daily  . spironolactone  12.5 mg Oral Daily  . torsemide  20 mg Oral QODAY    Infusions: . milrinone 0.25 mcg/kg/min (09/10/15 0230)    PRN Medications: magnesium oxide, oxyCODONE   Assessment:   1. 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 Recent MSSA/Klebsiella bacteremia & discitis 05/2015 3. Chronic systolic CHF/NICM on home milrinone: EF 10% 6/17 echo.  4. Chronic pericardial effusion: Mild to moderate 6/17 echo.  5. LV thrombus, on chronic Coumadin 6. PAF, maintaining NSR on amiodarone 7. NSVT  Plan/Discussion:    Remains on IV abx. HF stable with milrinone given through peripheral IV. Surveillance cultures drawn => blood cultures from 6/17 negative. - May replace PICC => will need tunneled catheter again, she agrees to this.  Will need to discuss with IR regarding  INR they will do procedure with.  Will hold coumadin for now.  - Will need 14 days imipenem dating from 6/17.   Continue current milrinone, CHF is stable.   She can go home when she has central access.   Length of Stay: 3  [name] MD 09/12/2015, 9:03 AM  Advanced Heart Failure Team Pager [phone] (M-F; 7a - 4p)  Please contact [facility] for night-coverage after hours (4p -7a ) and weekends on [email or web address]

## 2015-09-12 NOTE — Care Management Note (Signed)
Case Management Note  Patient Details  Name: Katrina Peck MRN: [id number] Date of Birth: Feb 01, 1946  Subjective/Objective:       Pt admitted with bacteremia             Action/Plan:  PTA independent from home with husband.  Active with [facility] for home milrinone and RN - will need resumption services at discharge.  [facility] aware of admit.  CM will request via physician sticky notes.  CM will continue to monitor for disposition needs   Expected Discharge Date:  09/13/15               Expected Discharge Plan:  Home w Home Health Services  In-House Referral:     Discharge planning Services  CM Consult  Post Acute Care Choice:  Resumption of Svcs/PTA Provider Choice offered to:  Patient  DME Arranged:  IV pump/equipment DME Agency:  [facility]  HH Arranged:  RN, IV Antibiotics (In addition to Milrinone) HH Agency:     Status of Service:  Completed, signed off  Medicare Important Message Given:  Yes Date Medicare IM Given:    Medicare IM give by:    Date Additional Medicare IM Given:    Additional Medicare Important Message give by:     If discussed at Long Length of Stay Meetings, dates discussed:    Additional Comments: Pt will discharge home today in care of husband.  Pt will discharge with both Milrinone and IV antibiotics.  Pt offered choice, chose [facility] for both RN and infusion therapy.  Agency contacted and referral accepted.   Cherylann Parr, RN 09/12/2015, 11:27 AM

## 2015-09-12 NOTE — Consult Note (Signed)
Chief Complaint: Patient was seen in consultation today for tunneled vs non tunneled central catheter placement Chief Complaint  Patient presents with  . Vascular Access Problem   at the request of Dr Elly Modena  Referring Physician(s): Dr Elly Modena  Supervising Physician: Irish Lack  Patient Status: Inpatient  History of Present Illness: Katrina Peck is a 70 y.o. female   Chronic systolic Heart failure EF 10 % Milrinone IV For discharge and home Milrinone   Hx tunneled Central Cath placed in IR 03/24/15 and 06/10/15 for Milrinone  Hx MSSA/Klebsiella bacteremia with associated T11-12 discitis. IV Rocephin 6 weeks Admitted from Cardiology office 09/06/15 with N/V/ chills BC +  Needed possible PICC exchange  Tunneled cath placed 06/10/15 was removed 09/09/15 Central catheter holiday since then Hx Afib abd LV thrombus---on coumadin Cannot come off per Dr Jearld Pies INR 2.94 today  Request for tunneled central catheter placement Will discuss with Rad BC neg so far 6/16 and 6/17     Past Medical History  Diagnosis Date  . Anemia   . Hypertension   . Blood transfusion without reported diagnosis   . Chronic systolic CHF (congestive heart failure) (HCC)     a. 06/2014 Echo: EF 20%.  Marland Kitchen NICM (nonischemic cardiomyopathy) (HCC)     a. 06/2014 Echo: EF 20%, diff HK, mild MR, mildly dil LA/RA, mildly reduced RV fxn;  b. 06/2014 Cath: nl cors.  . Pericardial effusion     a. 06/2014 Large effusion, no tamponade, s/p window for diag/therap - neg malig. b. 6/16 - recurrence w/o tamponade, subsequently folloed by echoes. Mod 12/16.  Marland Kitchen PAF (paroxysmal atrial fibrillation) (HCC)     a. with RVR, converted to NSR with amiodarone.  . Noncompliance   . LV (left ventricular) mural thrombus (HCC)     a. Noted on 12/16 echo.   . Bacteremia     a. MSSA bacteremia and discitis in 3/17, suspect tunneled catheter infection.   . Discitis     Past Surgical History    Procedure Laterality Date  . No past surgeries    . Left and right heart catheterization with coronary angiogram N/A 07/23/2014    Procedure: LEFT AND RIGHT HEART CATHETERIZATION WITH CORONARY ANGIOGRAM;  Surgeon: Laurey Morale, MD;  Location: [facility] CATH LAB;  Service: Cardiovascular;  Laterality: N/A;  . Subxyphoid pericardial window N/A 07/26/2014    Procedure: SUBXYPHOID PERICARDIAL WINDOW;  Surgeon: Delight Ovens, MD;  Location: [facility] OR;  Service: Thoracic;  Laterality: N/A;  . Tee without cardioversion N/A 07/26/2014    Procedure: TRANSESOPHAGEAL ECHOCARDIOGRAM (TEE);  Surgeon: Delight Ovens, MD;  Location[facility] OR;  Service: Thoracic;  Laterality: N/A;  . Pleural effusion drainage N/A 07/26/2014    Procedure: DRAINAGE OF PERICARDIAL EFFUSION;  Surgeon: Delight Ovens, MD;  Location: [facility] OR;  Service: Thoracic;  Laterality: N/A;  . Cardiac catheterization N/A 02/07/2015    Procedure: Right Heart Cath;  Surgeon: Laurey Morale, MD;  Location: [facility] INVASIVE CV LAB;  Service: Cardiovascular;  Laterality: N/A;  . Tee without cardioversion N/A 06/06/2015    Procedure: TRANSESOPHAGEAL ECHOCARDIOGRAM (TEE);  Surgeon: Dolores Patty, MD;  Location: [facility] ENDOSCOPY;  Service: Cardiovascular;  Laterality: N/A;  . Radiology with anesthesia N/A 06/08/2015    Procedure: MRI CERVICAL SPINE WITH/WITHOUT LUMBAR WITH/WITHOUT THORACIC WITH/WITHOUT       (RADIOLOGY WITH ANESTHESIA);  Surgeon: Medication Radiologist, MD;  Location: [facility] OR;  Service: Radiology;  Laterality: N/A;  Allergies: Review of patient's allergies indicates no known allergies.  Medications: Prior to Admission medications   Medication Sig Start Date End Date Taking? Authorizing Provider  amiodarone (PACERONE) 100 MG tablet Take 1 tablet (100 mg total) by mouth daily. 11/30/14  Yes Amy D Clegg, NP  magnesium oxide (MAG-OX) 400 MG tablet Take 200 mg by mouth daily as needed (leg cramping).   Yes Historical Provider, MD  oxyCODONE (OXY  IR/ROXICODONE) 5 MG immediate release tablet Take 2 tablets (10 mg total) by mouth every 4 (four) hours as needed for moderate pain or severe pain. 06/16/15  Yes Tiffany L Reed, DO  potassium chloride SA (K-DUR,KLOR-CON) 20 MEQ tablet Take 20 mEq by mouth daily.   Yes Historical Provider, MD  sodium chloride 0.9 % SOLN with milrinone 1 MG/ML SOLN 200 mcg/mL Inject 0.25 mcg/kg/min into the vein continuous. 140mg /140 ml Weight 45 kg   Yes Historical Provider, MD  spironolactone (ALDACTONE) 25 MG tablet Take 0.5 tablets (12.5 mg total) by mouth daily. 09/06/15  Yes Graciella Freer, PA-C  torsemide (DEMADEX) 20 MG tablet Take 20 mg by mouth every other day.   Yes Historical Provider, MD  warfarin (COUMADIN) 2 MG tablet Take as directed by [facility] Patient taking differently: Take 4 mg by mouth daily.  08/25/15  Yes Laurey Morale, MD  imipenem-cilastatin 500 mg in sodium chloride 0.9 % 100 mL Inject 500 mg into the vein every 8 (eight) hours. 09/12/15 09/24/15  Alison Murray, MD  milrinone (PRIMACOR) 20 MG/100 ML SOLN infusion Inject 11.25 mcg/min into the vein continuous. 09/12/15   Alison Murray, MD  warfarin IV (COUMADIN) 5 MG injection Inject 2.5 mLs (5 mg total) into the vein daily at 6 PM. 09/12/15   Alison Murray, MD     Family History  Problem Relation Age of Onset  . Hypertension Sister   . Diabetes Maternal Grandmother   . Cancer Mother     died young (pt was only in [employer] @ the time)  . Other Father     died in his 97's.    Social History   Social History  . Marital Status: Married    Spouse Name: N/A  . Number of Children: N/A  . Years of Education: N/A   Social History Main Topics  . Smoking status: Never Smoker   . Smokeless tobacco: Never Used  . Alcohol Use: No  . Drug Use: No  . Sexual Activity: No   Other Topics Concern  . None   Social History Narrative   Lives in [place] with husband.  Not currently working.     Review of Systems: A 12 point ROS  discussed and pertinent positives are indicated in the HPI above.  All other systems are negative.  Review of Systems  Constitutional: Positive for fatigue. Negative for fever, activity change and appetite change.  Respiratory: Negative for cough and shortness of breath.   Neurological: Positive for weakness.  Psychiatric/Behavioral: Negative for behavioral problems and confusion.    Vital Signs: BP 120/72 mmHg  Pulse 82  Temp(Src) 98.3 F (36.8 C) (Oral)  Resp 18  Ht 5\' 3"  (1.6 m)  Wt 97 lb 14.4 oz (44.407 kg)  BMI 17.35 kg/m2  SpO2 98%  Physical Exam  Constitutional: She is oriented to person, place, and time.  Cardiovascular: Normal rate and regular rhythm.   Pulmonary/Chest: Effort normal and breath sounds normal.  Abdominal: Soft.  Musculoskeletal: Normal range of motion.  Neurological: She is alert and oriented to person, place, and time.  Skin: Skin is warm and dry.  Psychiatric: She has a normal mood and affect. Her behavior is normal. Judgment and thought content normal.  Nursing note and vitals reviewed.   Mallampati Score:  MD Evaluation Airway: WNL Heart: WNL Abdomen: WNL Chest/ Lungs: WNL ASA  Classification: 3 Mallampati/Airway Score: One  Imaging: Dg Chest 1 View  09/09/2015  CLINICAL DATA:  Shortness of breath.  Possible PICC line infection. EXAM: CHEST 1 VIEW COMPARISON:  06/04/2015 FINDINGS: Diffuse enlargement of the cardiac silhouette which could be due to cardiac enlargement and/ or pericardial effusion. No focal airspace disease or consolidation in the lungs. Mild blunting of left costophrenic angle suggesting small pleural effusion. No pneumothorax. Mediastinal contours appear intact. Right central venous catheter with tip over the low SVC region. IMPRESSION: Diffuse enlargement of the cardiac silhouette. Small left pleural effusion. Electronically Signed   By: Burman Nieves M.D.   On: 09/09/2015 00:59   Ir Removal Tun Cv Cath W/o  Fl  09/09/2015  INDICATION: Bacteremia. Request for removal of tunneled central venous catheter. Initially placed by Dr. Deanne Coffer on June 10, 2015 for milrinone use. EXAM: REMOVAL OF TUNNELED CENTRAL VENOUS CATHETER MEDICATIONS: None. ANESTHESIA/SEDATION: No sedation medication given. FLUOROSCOPY TIME:  Fluoroscopy not used. COMPLICATIONS: None immediate. PROCEDURE: Informed written consent was obtained from the patient following an explanation of the procedure, risks, benefits and alternatives to treatment. A time out was performed prior to the initiation of the procedure. Sterile technique was utilized including sterile gloves, hand hygiene, and alcohol prep. Utilizing a only gentle traction, the catheter was easily removed intact. Hemostasis was obtained with manual compression. A dressing was placed. The patient tolerated the procedure well without immediate post procedural complication. IMPRESSION: Successful removal of tunneled venous catheter. Read by:  Corrin Parker, PA-C Electronically Signed   By: Gilmer Mor D.O.   On: 09/09/2015 12:05    Labs:  CBC:  Recent Labs  06/13/15 0509 06/28/15 1451 09/08/15 1722 09/12/15 0408  WBC 8.2 6.1 8.0 8.9  HGB 10.7* 10.6* 10.2* 10.3*  HCT 34.4* 34.2* 32.8* 33.0*  PLT 366 293 198 251    COAGS:  Recent Labs  03/24/15 0350 03/25/15 0410 03/25/15 1400 03/26/15 0515  09/09/15 0502 09/10/15 0340 09/11/15 0214 09/12/15 0408  INR 1.42 1.32  --  1.48  < > 1.60* 2.64* 2.84* 2.94*  APTT 39* 40* 49* 103*  --   --   --   --   --   < > = values in this interval not displayed.  BMP:  Recent Labs  09/09/15 0502 09/10/15 0340 09/11/15 0214 09/12/15 0408  NA 139 140 138 136  K 4.0 3.9 3.9 3.9  CL 108 106 107 105  CO2 GLUCOSE 74 138* 87 92  BUN CALCIUM 9.4 8.9 9.0 9.0  CREATININE 0.98 1.14* 0.80 0.85  GFRNONAA 58* 48* >60 >60  GFRAA >60 56* >60 >60    LIVER FUNCTION TESTS:  Recent Labs  05/05/15 1550  06/04/15 2048 06/05/15 0330 06/28/15 1451  BILITOT 1.3* 2.7* 2.4* 0.4  AST ALT 13*  ALKPHOS 97 146* 130* 84  PROT 6.4* 7.9 6.7 8.0  ALBUMIN 3.3* 3.2* 2.9* 2.9*    TUMOR MARKERS: No results for input(s): AFPTM, CEA, CA199, CHROMGRNA in the last 8760 hours.  Assessment and Plan:  Systolic Heart  failure Hx Afib abd LV thrombus---on coumadin Tunneled catheter placed for Milrinone 05/2015---removed 09/09/15 secondary +BC Holiday since then No growth BC since 6/16 Need for Home Milrinone Scheduled for tunneled vs non tunneled central catheter placement Risks and Benefits discussed with the patient including, but not limited to bleeding, infection, vascular injury, pneumothorax which may require chest tube placement, air embolism or even death All of the patient's questions were answered, patient is agreeable to proceed.  Consent signed and in chart.   Thank you for this interesting consult.  I greatly enjoyed meeting Katrina Peck and look forward to participating in their care.  A copy of this report was sent to the requesting provider on this date.  Electronically Signed: Kaena Santori A 09/12/2015, 11:10 AM   I spent a total of 20 Minutes    in face to face in clinical consultation, greater than 50% of which was counseling/coordinating care for tunneled vs non tunneled central catheter placement for milrinone

## 2015-09-12 NOTE — Discharge Summary (Addendum)
Physician Discharge Summary  Katrina Peck ZOX:[id number] DOB: Sep 11, 1945 DOA: 09/08/2015  PCP: Margit Hanks, MD  Admit date: 09/08/2015 Discharge date: 09/12/2015  Recommendations for Outpatient Follow-up:  Continue ertapenem through 09/24/2015 Continue milrinone drip per home regimen as per prior to the admission Please note change in coumadin is 5 mg at bedtime. Follow up with PCP in next 2-3 days to recheck IR and adjust coumadin dose accordingly to reflect INR 2-3 range.  Discharge Diagnoses:  Principal Problem:   Bacteremia Active Problems:   NICM (nonischemic cardiomyopathy) (HCC)   Pericardial effusion   Chronic systolic heart failure (HCC)   Paroxysmal atrial fibrillation (HCC)   LV (left ventricular) mural thrombus (HCC)   SOB (shortness of breath)    Discharge Condition: stable   Diet recommendation: as tolerated   History of present illness:   Per brief narrative 09/11/2015 "70 y.o. 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 Blood cultures were drawn along with CBC, BMET. Blood cultures grew aerobic gram negative rods and patient was called and asked to come to the hospital for treatment and PICC line exchange."  Hospital Course:   Assessment & Plan:  Enterobacter cloacae Bacteremia - Per infectious disease patient will continue ertapenem through 09/24/2015 which would be total of 2 week antibiotic treatment - Tunneled catheter removed 6/18 due to bacteremia  - Plan for PICC line versus tunneled catheter placement prior to discharge for long term IV therapy since repeat blood cultures negative  - Please note patient's blood cultures grew Enterobacter but  repeat blood cultures with no growth to date - Patient seen by infectious disease in consultation  Chronic systolic CHF / NICM - Continue milrinone drip - Continue amiodarone, spironolactone and torsemide - Cardiology following - 2-D echo on this admission with ejection fraction of 10% - Stable respiratory status  LV Thrombus  - Continue Coumadin, 5 mg at bedtime on discharge  Chronic pericardial effusion - 2 D ECHO 09/11/2015 - EF 10%, diffuse hypokinesis, small to moderate pericardial effusion  DVT prophylaxis: warfarin on discharge  Code Status: Full   Consultants:  Cardiology   Infectious Disease  Procedures:  PICC line removal 6/16  2 D ECHO 09/11/2015 - EF 10%, diffuse hypokinesis, small to moderate pericardial effusion mostly posterior and lateral    Signed:  Manson Passey, MD  [facility] 09/12/2015, 11:01 AM  Pager #: [phone]  Time spent in minutes: more than 30 minutes  Discharge Exam: Filed Vitals:   09/11/15 2142 09/12/15 0500  BP: 140/87 120/72  Pulse: 73 82  Temp: 98.4 F (36.9 C) 98.3 F (36.8 C)  Resp: 18 18   Filed Vitals:   09/11/15 1200 09/11/15 2142 09/12/15 0425 09/12/15 0500  BP: 135/79 140/87  120/72  Pulse: 90 73  82  Temp: 98.3 F (36.8 C) 98.4 F (36.9 C)  98.3 F (36.8 C)  TempSrc: Oral Oral  Oral  Resp: 18 18  18   Height:      Weight:   44.407 kg (97 lb 14.4 oz)   SpO2: 100% 100%  98%    General: Pt is alert, follows commands appropriately, not in acute distress Cardiovascular: Regular rate and rhythm, S1/S2 + Respiratory: Clear to auscultation  bilaterally, no wheezing, no crackles, no rhonchi Abdominal: Soft, non tender, non distended, bowel sounds +, no guarding Extremities: no edema, no cyanosis, pulses palpable bilaterally DP and PT Neuro: Grossly nonfocal  Discharge Instructions  Discharge Instructions    Call MD for:  difficulty breathing, headache or visual disturbances    Complete by:  As  directed      Call MD for:  persistant dizziness or light-headedness    Complete by:  As directed      Call MD for:  persistant nausea and vomiting    Complete by:  As directed      Call MD for:  severe uncontrolled pain    Complete by:  As directed      Diet - low sodium heart healthy    Complete by:  As directed      Discharge instructions    Complete by:  As directed   Continue imipenem through 09/24/2015 Continue milrinone drip per home regimen as per prior to the admission Please note change in coumadin is 5 mg at bedtime. Follow up with PCP in next 2-3 days to recheck IR and adjust coumadin dose accordingly to reflect INR 2-3 range.     Increase activity slowly    Complete by:  As directed             Medication List    STOP taking these medications        warfarin 2 MG tablet  Commonly known as:  COUMADIN  Replaced by:  warfarin IV 5 MG injection      TAKE these medications        amiodarone 100 MG tablet  Commonly known as:  PACERONE  Take 1 tablet (100 mg total) by mouth daily.     ertapenem 1 g in sodium chloride 0.9 % 50 mL  Inject 1 g into the vein daily.     magnesium oxide 400 MG tablet  Commonly known as:  MAG-OX  Take 200 mg by mouth daily as needed (leg cramping).     milrinone 20 MG/100 ML Soln infusion  Commonly known as:  PRIMACOR  Inject 11.25 mcg/min into the vein continuous.     oxyCODONE 5 MG immediate release tablet  Commonly known as:  Oxy IR/ROXICODONE  Take 2 tablets (10 mg total) by mouth every 4 (four) hours as needed for moderate pain or severe pain.     potassium chloride SA 20 MEQ tablet  Commonly known as:  K-DUR,KLOR-CON  Take 20 mEq by mouth daily.     sodium chloride 0.9 % SOLN with milrinone 1 MG/ML SOLN 200 mcg/mL  Inject 0.25 mcg/kg/min into the vein continuous. 140mg /140 ml Weight 45 kg     spironolactone 25 MG tablet  Commonly known as:  ALDACTONE  Take 0.5 tablets (12.5 mg total) by mouth daily.     torsemide 20  MG tablet  Commonly known as:  DEMADEX  Take 20 mg by mouth every other day.     warfarin IV 5 MG injection  Commonly known as:  COUMADIN  Inject 2.5 mLs (5 mg total) into the vein daily at 6 PM.            Follow-up Information    Schedule an appointment as soon as possible for a visit with Margit Hanks, MD.   Specialty:  Internal Medicine   Why:  Follow up appt after recent hospitalization, please follow up with PCP to rechec INR by 6/21 or 6/22  Contact information:   [address] [place] [address] [phone]        The results of significant diagnostics from this hospitalization (including imaging, microbiology, ancillary and laboratory) are listed below for reference.    Significant Diagnostic Studies: Dg Chest 1 View  09/09/2015  CLINICAL DATA:  Shortness of breath.  Possible PICC line infection. EXAM: CHEST 1 VIEW COMPARISON:  06/04/2015 FINDINGS: Diffuse enlargement of the cardiac silhouette which could be due to cardiac enlargement and/ or pericardial effusion. No focal airspace disease or consolidation in the lungs. Mild blunting of left costophrenic angle suggesting small pleural effusion. No pneumothorax. Mediastinal contours appear intact. Right central venous catheter with tip over the low SVC region. IMPRESSION: Diffuse enlargement of the cardiac silhouette. Small left pleural effusion. Electronically Signed   By: Burman Nieves M.D.   On: 09/09/2015 00:59   Ir Removal Tun Cv Cath W/o Fl  09/09/2015  INDICATION: Bacteremia. Request for removal of tunneled central venous catheter. Initially placed by Dr. Deanne Coffer on June 10, 2015 for milrinone use. EXAM: REMOVAL OF TUNNELED CENTRAL VENOUS CATHETER MEDICATIONS: None. ANESTHESIA/SEDATION: No sedation medication given. FLUOROSCOPY TIME:  Fluoroscopy not used. COMPLICATIONS: None immediate. PROCEDURE: Informed written consent was obtained from the patient following an explanation of the procedure, risks,  benefits and alternatives to treatment. A time out was performed prior to the initiation of the procedure. Sterile technique was utilized including sterile gloves, hand hygiene, and alcohol prep. Utilizing a only gentle traction, the catheter was easily removed intact. Hemostasis was obtained with manual compression. A dressing was placed. The patient tolerated the procedure well without immediate post procedural complication. IMPRESSION: Successful removal of tunneled venous catheter. Read by:  Corrin Parker, PA-C Electronically Signed   By: Gilmer Mor D.O.   On: 09/09/2015 12:05    Microbiology: Recent Results (from the past 240 hour(s))  Blood culture (routine x 2)     Status: Abnormal   Collection Time: 09/08/15 11:55 PM  Result Value Ref Range Status   Specimen Description BLOOD LEFT ANTECUBITAL  Final   Special Requests BOTTLES DRAWN AEROBIC AND ANAEROBIC 5CC  Final   Culture  Setup Time   Final    GRAM NEGATIVE RODS IN BOTH AEROBIC AND ANAEROBIC BOTTLES CRITICAL RESULT CALLED TO, READ BACK BY AND VERIFIED WITH: Vernard Gambles, PHARMD  0617/17 MKELLY    Culture ENTEROBACTER CLOACAE (A)  Final   Report Status 09/12/2015 FINAL  Final   Organism ID, Bacteria ENTEROBACTER CLOACAE  Final      Susceptibility   Enterobacter cloacae - MIC*    CEFAZOLIN >=64 RESISTANT Resistant     CEFEPIME <=1 SENSITIVE Sensitive     CEFTAZIDIME >=64 RESISTANT Resistant     CEFTRIAXONE >=64 RESISTANT Resistant     CIPROFLOXACIN <=0.25 SENSITIVE Sensitive     GENTAMICIN <=1 SENSITIVE Sensitive     IMIPENEM <=0.25 SENSITIVE Sensitive     TRIMETH/SULFA <=20 SENSITIVE Sensitive     PIP/TAZO >=128 RESISTANT Resistant     * ENTEROBACTER CLOACAE  Blood Culture ID Panel (Reflexed)     Status: Abnormal   Collection Time: 09/08/15 11:55 PM  Result Value Ref Range Status   Enterococcus species NOT DETECTED NOT DETECTED Final   Vancomycin resistance NOT DETECTED NOT DETECTED Final   Listeria monocytogenes  NOT DETECTED NOT DETECTED Final   Staphylococcus species NOT DETECTED NOT DETECTED Final   Staphylococcus aureus NOT DETECTED NOT DETECTED Final   Methicillin resistance NOT DETECTED NOT DETECTED Final  Streptococcus species NOT DETECTED NOT DETECTED Final   Streptococcus agalactiae NOT DETECTED NOT DETECTED Final   Streptococcus pneumoniae NOT DETECTED NOT DETECTED Final   Streptococcus pyogenes NOT DETECTED NOT DETECTED Final   Acinetobacter baumannii NOT DETECTED NOT DETECTED Final   Enterobacteriaceae species DETECTED (A) NOT DETECTED Final    Comment: CRITICAL RESULT CALLED TO, READ BACK BY AND VERIFIED WITH: VERONDA BRYK, PHARMD  09/10/15 MKELLY    Enterobacter cloacae complex DETECTED (A) NOT DETECTED Final    Comment: CRITICAL RESULT CALLED TO, READ BACK BY AND VERIFIED WITH: VERONDA BRYK,PHARMD  09/10/15 MKELLY    Escherichia coli NOT DETECTED NOT DETECTED Final   Klebsiella oxytoca NOT DETECTED NOT DETECTED Final   Klebsiella pneumoniae NOT DETECTED NOT DETECTED Final   Proteus species NOT DETECTED NOT DETECTED Final   Serratia marcescens NOT DETECTED NOT DETECTED Final   Carbapenem resistance NOT DETECTED NOT DETECTED Final   Haemophilus influenzae NOT DETECTED NOT DETECTED Final   Neisseria meningitidis NOT DETECTED NOT DETECTED Final   Pseudomonas aeruginosa NOT DETECTED NOT DETECTED Final   Candida albicans NOT DETECTED NOT DETECTED Final   Candida glabrata NOT DETECTED NOT DETECTED Final   Candida krusei NOT DETECTED NOT DETECTED Final   Candida parapsilosis NOT DETECTED NOT DETECTED Final   Candida tropicalis NOT DETECTED NOT DETECTED Final  Blood culture (routine x 2)     Status: None (Preliminary result)   Collection Time: 09/09/15 12:05 AM  Result Value Ref Range Status   Specimen Description BLOOD RIGHT FOREARM  Final   Special Requests BOTTLES DRAWN AEROBIC AND ANAEROBIC 5CC  Final   Culture NO GROWTH 2 DAYS  Final   Report Status PENDING   Incomplete  Culture, blood (Routine X 2) w Reflex to ID Panel     Status: None (Preliminary result)   Collection Time: 09/10/15  3:08 PM  Result Value Ref Range Status   Specimen Description BLOOD LEFT ANTECUBITAL  Final   Special Requests BOTTLES DRAWN AEROBIC AND ANAEROBIC 5CC  Final   Culture NO GROWTH 1 DAY  Final   Report Status PENDING  Incomplete  Culture, blood (Routine X 2) w Reflex to ID Panel     Status: None (Preliminary result)   Collection Time: 09/10/15  3:14 PM  Result Value Ref Range Status   Specimen Description BLOOD BLOOD LEFT FOREARM  Final   Special Requests BOTTLES DRAWN AEROBIC ONLY 5CC  Final   Culture NO GROWTH 1 DAY  Final   Report Status PENDING  Incomplete     Labs: Basic Metabolic Panel:  Recent Labs Lab 09/08/15 1722 09/09/15 0502 09/10/15 0340 09/10/15 0904 09/11/15 0214 09/12/15 0408  NA 137 139 140  --  138 136  K 3.9 4.0 3.9  --  3.9 3.9  CL 104 108 106  --  107 105  CO2 --  25 24  GLUCOSE 91 74 138*  --  87 92  BUN --  11 9  CREATININE 0.86 0.98 1.14*  --  0.80 0.85  CALCIUM 9.4 9.4 8.9  --  9.0 9.0  MG  --   --   --  1.8 1.8  --    Liver Function Tests: No results for input(s): AST, ALT, ALKPHOS, BILITOT, PROT, ALBUMIN in the last 168 hours. No results for input(s): LIPASE, AMYLASE in the last 168 hours. No results for input(s): AMMONIA in the last 168 hours. CBC:  Recent Labs Lab  09/08/15 1722 09/09/15 1630 09/12/15 0408  WBC 8.0  --  8.9  NEUTROABS  --  6.5  --   HGB 10.2*  --  10.3*  HCT 32.8*  --  33.0*  MCV 82.8  --  84.6  PLT 198  --  251   Cardiac Enzymes: No results for input(s): CKTOTAL, CKMB, CKMBINDEX, TROPONINI in the last 168 hours. BNP: BNP (last 3 results)  Recent Labs  02/03/15 2310 03/22/15 2015 06/05/15 0330  BNP 4003.4* >4500.0* 3619.4*    ProBNP (last 3 results) No results for input(s): PROBNP in the last 8760 hours.  CBG: No results for input(s): GLUCAP in the last 168  hours.

## 2015-09-12 NOTE — Care Management Important Message (Signed)
Important Message  Patient Details  Name: [name] MRN: [id number] Date of Birth: 08/11/1945   Medicare Important Message Given:  Yes    Bernadette Hoit 09/12/2015, 10:15 AM

## 2015-09-12 NOTE — Progress Notes (Signed)
    [facility]     Since patient is being discharged home, can ease regimen to ertapenem 1gm iv daily  Diagnosis: bacteremia  Culture Result: enterobacter No Known Allergies  Discharge antibiotics: Ertapenem Per pharmacy protocol   Duration: 14 days (includig iv therapy while hospitalized) End Date: 09/24/2015  [facility] Care Per Protocol:  Labs weekly while on IV antibiotics: __x CBC with differential _x_ CMP   Fax weekly labs to [phone]  Clinic Follow Up Appt: As needed [facility] Cell: [phone] Pager: [phone]  09/12/2015, 1:55 PM

## 2015-09-13 DIAGNOSIS — A499 Bacterial infection, unspecified: Secondary | ICD-10-CM | POA: Insufficient documentation

## 2015-09-13 DIAGNOSIS — I480 Paroxysmal atrial fibrillation: Secondary | ICD-10-CM

## 2015-09-13 DIAGNOSIS — R509 Fever, unspecified: Secondary | ICD-10-CM | POA: Insufficient documentation

## 2015-09-13 DIAGNOSIS — T80219A Unspecified infection due to central venous catheter, initial encounter: Secondary | ICD-10-CM | POA: Insufficient documentation

## 2015-09-13 LAB — CBC
HCT: 33.5 % — ABNORMAL LOW (ref 36.0–46.0)
Hemoglobin: 10.2 g/dL — ABNORMAL LOW (ref 12.0–15.0)
MCH: 26.3 pg (ref 26.0–34.0)
MCHC: 30.4 g/dL (ref 30.0–36.0)
MCV: 86.3 fL (ref 78.0–100.0)
PLATELETS: 242 10*3/uL (ref 150–400)
RBC: 3.88 MIL/uL (ref 3.87–5.11)
RDW: 16.7 % — ABNORMAL HIGH (ref 11.5–15.5)
WBC: 7.9 10*3/uL (ref 4.0–10.5)

## 2015-09-13 LAB — BASIC METABOLIC PANEL
Anion gap: 6 (ref 5–15)
BUN: 10 mg/dL (ref 6–20)
CALCIUM: 9.4 mg/dL (ref 8.9–10.3)
CO2: 24 mmol/L (ref 22–32)
CREATININE: 0.88 mg/dL (ref 0.44–1.00)
Chloride: 107 mmol/L (ref 101–111)
GFR calc non Af Amer: >60 mL/min (ref >60)
Glucose, Bld: 94 mg/dL (ref 65–99)
Potassium: 4.5 mmol/L (ref 3.5–5.1)
SODIUM: 137 mmol/L (ref 135–145)

## 2015-09-13 LAB — PROTIME-INR
INR: 2.62 — ABNORMAL HIGH (ref 0.00–1.49)
PROTHROMBIN TIME: 27.7 s — AB (ref 11.6–15.2)

## 2015-09-13 LAB — APTT: aPTT: 45 seconds — ABNORMAL HIGH (ref 24–37)

## 2015-09-13 MED ORDER — SODIUM CHLORIDE 0.9 % IV SOLN
1.0000 g | INTRAVENOUS | Status: DC
  Administered 2015-09-13 – 2015-09-16 (×4): 1 g via INTRAVENOUS
  Filled 2015-09-13 (×4): qty 1

## 2015-09-13 NOTE — Progress Notes (Signed)
ANTICOAGULATION CONSULT NOTE -Follow up  Pharmacy Consult for Coumadin Indication: h/o LV thrombus, low output CHF  No Known Allergies  Patient Measurements: Height: 5\' 3"  (160 cm) Weight: 98 lb 3.2 oz (44.543 kg) (scale b) IBW/kg (Calculated) : 52.4  Vital Signs: Temp: 97.6 F (36.4 C) (06/20 0500) Temp Source: Oral (06/20 0500) BP: 102/65 mmHg (06/20 0500) Pulse Rate: 71 (06/20 0500)  Labs:  Recent Labs  09/11/15 0214 09/12/15 0408 09/13/15 0344  HGB  --  10.3* 10.2*  HCT  --  33.0* 33.5*  PLT  --  251 242  LABPROT 29.4* 30.1* 27.7*  INR 2.84* 2.94* 2.62*  CREATININE 0.80 0.85 0.88    Estimated Creatinine Clearance: 42.4 mL/min (by C-G formula based on Cr of 0.88).  Assessment: 70 y.o. F presented to PCP with fever, chills, SOB - PICC line infection so advised to come to ED. o/p blood cx growing GNR per ED MD.  PMH: HTN, LV thrombus, low o/p CHF (home milrinone), afib, h/o MSSA bacteremia/discitis 05/2015, noncompliance  Anticoagulation: Coumadin PTA for LV thrombus. INR 1.58 (subtherapeutic) on admission.  Home dose: 4mg  daily - last taken 6/15 pm (last anticoag visit note 09/02/15 said to incr to 6mg  daily)  INR 2.62  Hold warfarin 6/20 - hopefully IR cath Wed then d/c  Nephrology: SCr 0.88  Heme: H&H 10.2/33.5, Plt 242  Goal of Therapy:  INR 2-3 Monitor platelets by anticoagulation protocol: Yes    Plan:  Hold warfarin Daily INR, CBC q72h Monitor for s/sx of bleeding  Isaac Bliss, PharmD, BCPS, [facility] Clinical Pharmacist Pager [phone] 09/13/2015 9:36 AM

## 2015-09-13 NOTE — Progress Notes (Addendum)
Patient ID: Katrina Peck, female   DOB: 08-27-1945, 70 y.o.   MRN: [id number]  PROGRESS NOTE    Katrina Peck  WUJ:[id number] DOB: [birth year] DOA: 09/08/2015  PCP: Margit Hanks, MD   Brief Narrative:  "70 y.o. female with past medical history of chronic diastolic CHF, ICM on milrinone, atrial fibrillation on coumadin, history of MSSA and Klebsiella bacteremia in March 2017 secondary to catheter related blood stream infection and associated T11/T12 discitis treated with IV Rocephin for 6 weeks who presented to Cardiology office on 6/13 with complaint of low grade fever and chills. Blood cultures were drawn and subsequently grew aerobic gram negative rods and patient was called and asked to come to the hospital for treatment and PICC line exchange. Tunneled catheter removed 09/11/2015. Plan is for catheter replacement once INR below 2. She will continue ertapenem on discharge through 09/24/2015. She will also continue milrinone drip per home regimen.   Assessment & Plan:  Enterobacter cloacae Bacteremia - Per infectious disease patient will continue ertapenem through 09/24/2015 which would be total of 2 week antibiotic treatment - Tunneled catheter removed 6/18 due to bacteremia  - Plan for tunneled catheter placement once INR less than 2, Coumadin now on hold - Admission blood cultures grew Enterobacter but repeat blood cultures with no growth to date  Chronic systolic CHF / NICM - Continue milrinone drip - Continue amiodarone, spironolactone and torsemide - Cardiology following - 2-D echo on this admission with ejection fraction of 10%  LV Thrombus / Paroxysmal atrial fibrillation  - CHADS vasc score 4  - Coumadin on hold due to plan for tunneled catheter placement  - Continue amiodarone   Chronic pericardial effusion - 2 D ECHO 09/11/2015 - EF 10%, diffuse hypokinesis, small to moderate pericardial effusion  DVT prophylaxis: warfarin on discharge  Family communication:  husband anthony at the bedside this am  Code Status: Full Disposition: home once catheter placed    Consultants:  Cardiology   Infectious Disease  Procedures:  PICC line removal 6/16  2 D ECHO 09/11/2015 - EF 10%, diffuse hypokinesis, small to moderate pericardial effusion mostly posterior and lateral   Subjective: No overnight events.   Objective: Filed Vitals:   09/12/15 0500 09/12/15 1141 09/12/15 1930 09/13/15 0500  BP: 120/72 98/61 107/70 102/65  Pulse: 82 73 76 71  Temp: 98.3 F (36.8 C) 98 F (36.7 C) 98.6 F (37 C) 97.6 F (36.4 C)  TempSrc: Oral Oral Oral Oral  Resp: Height:      Weight:    44.543 kg (98 lb 3.2 oz)  SpO2: 98% 100% 99% 100%    Intake/Output Summary (Last 24 hours) at 09/13/15 1002 Last data filed at 09/13/15 0900  Gross per 24 hour  Intake  824.2 ml  Output    800 ml  Net   24.2 ml   Filed Weights   09/11/15 0637 09/12/15 0425 09/13/15 0500  Weight: 44.861 kg (98 lb 14.4 oz) 44.407 kg (97 lb 14.4 oz) 44.543 kg (98 lb 3.2 oz)    Examination:  General exam: Appears calm and comfortable  Respiratory system: Clear to auscultation. Respiratory effort normal. Cardiovascular system: S1 & S2 heard, RRR.  Gastrointestinal system: Abdomen is nondistended, soft and nontender. No organomegaly or masses felt. Normal bowel sounds heard. Central nervous system: Alert and oriented. No focal neurological deficits. Extremities: Symmetric 5 x 5 power. Skin: No rashes, lesions or ulcers Psychiatry: Judgement and insight appear normal. Mood &  affect appropriate.   Data Reviewed: I have personally reviewed following labs and imaging studies  CBC:  Recent Labs Lab 09/08/15 1722 09/09/15 1630 09/12/15 0408 09/13/15 0344  WBC 8.0  --  8.9 7.9  NEUTROABS  --  6.5  --   --   HGB 10.2*  --  10.3* 10.2*  HCT 32.8*  --  33.0* 33.5*  MCV 82.8  --  84.6 86.3  PLT 198  --  251 242   Basic Metabolic Panel:  Recent Labs Lab  09/09/15 0502 09/10/15 0340 09/10/15 0904 09/11/15 0214 09/12/15 0408 09/13/15 0344  NA 139 140  --  138 136 137  K 4.0 3.9  --  3.9 3.9 4.5  CL 108 106  --  107 105 107  CO2 23 26  --  25 24 24   GLUCOSE 74 138*  --  87 92 94  BUN 11 18  --  11 9 10   CREATININE 0.98 1.14*  --  0.80 0.85 0.88  CALCIUM 9.4 8.9  --  9.0 9.0 9.4  MG  --   --  1.8 1.8  --   --    GFR: Estimated Creatinine Clearance: 42.4 mL/min (by C-G formula based on Cr of 0.88). Liver Function Tests: No results for input(s): AST, ALT, ALKPHOS, BILITOT, PROT, ALBUMIN in the last 168 hours. No results for input(s): LIPASE, AMYLASE in the last 168 hours. No results for input(s): AMMONIA in the last 168 hours. Coagulation Profile:  Recent Labs Lab 09/09/15 0502 09/10/15 0340 09/11/15 0214 09/12/15 0408 09/13/15 0344  INR 1.60* 2.64* 2.84* 2.94* 2.62*   Cardiac Enzymes: No results for input(s): CKTOTAL, CKMB, CKMBINDEX, TROPONINI in the last 168 hours. BNP (last 3 results) No results for input(s): PROBNP in the last 8760 hours. HbA1C: No results for input(s): HGBA1C in the last 72 hours. CBG: No results for input(s): GLUCAP in the last 168 hours. Lipid Profile: No results for input(s): CHOL, HDL, LDLCALC, TRIG, CHOLHDL, LDLDIRECT in the last 72 hours. Thyroid Function Tests: No results for input(s): TSH, T4TOTAL, FREET4, T3FREE, THYROIDAB in the last 72 hours. Anemia Panel: No results for input(s): VITAMINB12, FOLATE, FERRITIN, TIBC, IRON, RETICCTPCT in the last 72 hours. Urine analysis:    Component Value Date/Time   COLORURINE AMBER* 09/09/2015 0222   APPEARANCEUR CLOUDY* 09/09/2015 0222   LABSPEC 1.029 09/09/2015 0222   PHURINE 6.0 09/09/2015 0222   GLUCOSEU NEGATIVE 09/09/2015 0222   HGBUR LARGE* 09/09/2015 0222   BILIRUBINUR NEGATIVE 09/09/2015 0222   KETONESUR 40* 09/09/2015 0222   PROTEINUR 100* 09/09/2015 0222   UROBILINOGEN 1.0 07/25/2014 2239   NITRITE NEGATIVE 09/09/2015 0222    LEUKOCYTESUR SMALL* 09/09/2015 0222   Sepsis Labs: @LABRCNTIP (procalcitonin:4,lacticidven:4)   Recent Results (from the past 240 hour(s))  Blood culture (routine x 2)     Status: Abnormal   Collection Time: 09/08/15 11:55 PM  Result Value Ref Range Status   Specimen Description BLOOD LEFT ANTECUBITAL  Final   Special Requests BOTTLES DRAWN AEROBIC AND ANAEROBIC 5CC  Final   Culture  Setup Time   Final    GRAM NEGATIVE RODS IN BOTH AEROBIC AND ANAEROBIC BOTTLES CRITICAL RESULT CALLED TO, READ BACK BY AND VERIFIED WITH: Vernard Gambles, PHARMD @0449  0617/17 MKELLY    Culture ENTEROBACTER CLOACAE (A)  Final   Report Status 09/12/2015 FINAL  Final   Organism ID, Bacteria ENTEROBACTER CLOACAE  Final      Susceptibility   Enterobacter cloacae - MIC*  CEFAZOLIN >=64 RESISTANT Resistant     CEFEPIME <=1 SENSITIVE Sensitive     CEFTAZIDIME >=64 RESISTANT Resistant     CEFTRIAXONE >=64 RESISTANT Resistant     CIPROFLOXACIN <=0.25 SENSITIVE Sensitive     GENTAMICIN <=1 SENSITIVE Sensitive     IMIPENEM <=0.25 SENSITIVE Sensitive     TRIMETH/SULFA <=20 SENSITIVE Sensitive     PIP/TAZO >=128 RESISTANT Resistant     * ENTEROBACTER CLOACAE  Blood Culture ID Panel (Reflexed)     Status: Abnormal   Collection Time: 09/08/15 11:55 PM  Result Value Ref Range Status   Enterococcus species NOT DETECTED NOT DETECTED Final   Vancomycin resistance NOT DETECTED NOT DETECTED Final   Listeria monocytogenes NOT DETECTED NOT DETECTED Final   Staphylococcus species NOT DETECTED NOT DETECTED Final   Staphylococcus aureus NOT DETECTED NOT DETECTED Final   Methicillin resistance NOT DETECTED NOT DETECTED Final   Streptococcus species NOT DETECTED NOT DETECTED Final   Streptococcus agalactiae NOT DETECTED NOT DETECTED Final   Streptococcus pneumoniae NOT DETECTED NOT DETECTED Final   Streptococcus pyogenes NOT DETECTED NOT DETECTED Final   Acinetobacter baumannii NOT DETECTED NOT DETECTED Final    Enterobacteriaceae species DETECTED (A) NOT DETECTED Final    Comment: CRITICAL RESULT CALLED TO, READ BACK BY AND VERIFIED WITH: VERONDA BRYK, PHARMD @0449  09/10/15 MKELLY    Enterobacter cloacae complex DETECTED (A) NOT DETECTED Final    Comment: CRITICAL RESULT CALLED TO, READ BACK BY AND VERIFIED WITH: VERONDA BRYK,PHARMD @0449  09/10/15 MKELLY    Escherichia coli NOT DETECTED NOT DETECTED Final   Klebsiella oxytoca NOT DETECTED NOT DETECTED Final   Klebsiella pneumoniae NOT DETECTED NOT DETECTED Final   Proteus species NOT DETECTED NOT DETECTED Final   Serratia marcescens NOT DETECTED NOT DETECTED Final   Carbapenem resistance NOT DETECTED NOT DETECTED Final   Haemophilus influenzae NOT DETECTED NOT DETECTED Final   Neisseria meningitidis NOT DETECTED NOT DETECTED Final   Pseudomonas aeruginosa NOT DETECTED NOT DETECTED Final   Candida albicans NOT DETECTED NOT DETECTED Final   Candida glabrata NOT DETECTED NOT DETECTED Final   Candida krusei NOT DETECTED NOT DETECTED Final   Candida parapsilosis NOT DETECTED NOT DETECTED Final   Candida tropicalis NOT DETECTED NOT DETECTED Final  Blood culture (routine x 2)     Status: None (Preliminary result)   Collection Time: 09/09/15 12:05 AM  Result Value Ref Range Status   Specimen Description BLOOD RIGHT FOREARM  Final   Special Requests BOTTLES DRAWN AEROBIC AND ANAEROBIC 5CC  Final   Culture  Setup Time   Final    GRAM NEGATIVE RODS ANAEROBIC BOTTLE ONLY CRITICAL RESULT CALLED TO, READ BACK BY AND VERIFIED WITH: J. FRENS PHARMD AT 0815 09/13/15 BY D. VANHOOK    Culture   Final    GRAM NEGATIVE RODS CULTURE REINCUBATED FOR BETTER GROWTH    Report Status PENDING  Incomplete  Culture, blood (Routine X 2) w Reflex to ID Panel     Status: None (Preliminary result)   Collection Time: 09/10/15  3:08 PM  Result Value Ref Range Status   Specimen Description BLOOD LEFT ANTECUBITAL  Final   Special Requests BOTTLES DRAWN AEROBIC AND  ANAEROBIC 5CC  Final   Culture NO GROWTH 2 DAYS  Final   Report Status PENDING  Incomplete  Culture, blood (Routine X 2) w Reflex to ID Panel     Status: None (Preliminary result)   Collection Time: 09/10/15  3:14 PM  Result Value Ref Range  Status   Specimen Description BLOOD BLOOD LEFT FOREARM  Final   Special Requests BOTTLES DRAWN AEROBIC ONLY 5CC  Final   Culture NO GROWTH 2 DAYS  Final   Report Status PENDING  Incomplete      Radiology Studies: Ir Removal Tun Cv Cath W/o Fl  09/09/2015  INDICATION: Bacteremia. Request for removal of tunneled central venous catheter. Initially placed by Dr. Deanne Coffer on June 10, 2015 for milrinone use. EXAM: REMOVAL OF TUNNELED CENTRAL VENOUS CATHETER MEDICATIONS: None. ANESTHESIA/SEDATION: No sedation medication given. FLUOROSCOPY TIME:  Fluoroscopy not used. COMPLICATIONS: None immediate. PROCEDURE: Informed written consent was obtained from the patient following an explanation of the procedure, risks, benefits and alternatives to treatment. A time out was performed prior to the initiation of the procedure. Sterile technique was utilized including sterile gloves, hand hygiene, and alcohol prep. Utilizing a only gentle traction, the catheter was easily removed intact. Hemostasis was obtained with manual compression. A dressing was placed. The patient tolerated the procedure well without immediate post procedural complication. IMPRESSION: Successful removal of tunneled venous catheter. Read by:  Corrin Parker, PA-C Electronically Signed   By: Gilmer Mor D.O.   On: 09/09/2015 12:05       Scheduled Meds: . amiodarone  100 mg Oral Daily  . ertapenem  1 g Intravenous Q24H  . methocarbamol  500 mg Oral Daily  . potassium chloride SA  20 mEq Oral Daily  . spironolactone  12.5 mg Oral Daily  . torsemide  20 mg Oral QODAY  . Warfarin - Pharmacist Dosing Inpatient   Does not apply q1800   Continuous Infusions: . milrinone 0.25 mcg/kg/min (09/12/15 1322)      LOS: 4 days    Time spent: 25 minutes  Greater than 50% of the time spent on counseling and coordinating the care.   Manson Passey, MD [facility] Pager [phone]  If 7PM-7AM, please contact night-coverage [email or web address] Password [email or web address] 09/13/2015, 10:02 AM

## 2015-09-13 NOTE — Progress Notes (Signed)
Pharmacy note -  Called by the microbiology lab that the blood culture drawn on 09/09/15 is positive for gram-negative rods.  Dr. Drue Second informed.  No antimicrobial changes needed - remains on imipenem.  Celedonio Miyamoto, PharmD, BCPS-AQ ID Clinical Pharmacist Pager [phone]

## 2015-09-13 NOTE — Progress Notes (Signed)
Patient ID: Katrina Peck, female   DOB: May 25, 1945, 70 y.o.   MRN: [id number] P   Advanced Heart Failure Rounding Note   Subjective:    Feels fine this morning.  Repeat blood cultures remain negative.  BP stable.    Echo: EF 10%, severe LV dilation, small to moderate pericardial effusion.    Objective:   Weight Range:  Vital Signs:   Temp:  [97.6 F (36.4 C)-98.6 F (37 C)] 97.6 F (36.4 C) (06/20 0500) Pulse Rate:  [71-76] 71 (06/20 0500) Resp:  [18] 18 (06/20 0500) BP: (98-107)/(61-70) 102/65 mmHg (06/20 0500) SpO2:  [99 %-100 %] 100 % (06/20 0500) Weight:  [98 lb 3.2 oz (44.543 kg)] 98 lb 3.2 oz (44.543 kg) (06/20 0500) Last BM Date: 09/12/15  Weight change: Filed Weights   09/11/15 0637 09/12/15 0425 09/13/15 0500  Weight: 98 lb 14.4 oz (44.861 kg) 97 lb 14.4 oz (44.407 kg) 98 lb 3.2 oz (44.543 kg)    Intake/Output:   Intake/Output Summary (Last 24 hours) at 09/13/15 0824 Last data filed at 09/13/15 3474  Gross per 24 hour  Intake 1064.2 ml  Output    800 ml  Net  264.2 ml     Physical Exam: General:  Well appearing. Lying in bed No resp difficulty HEENT: normal Neck: supple. JVP 7. Carotids 2+ bilat; no bruits. No lymphadenopathy or thryomegaly appreciated. Cor: PMI laterally displaced. Regular rate & rhythm. No rubs, gallops or murmurs. Lungs: clear Abdomen: soft, nontender, nondistended. No hepatosplenomegaly. No bruits or masses. Good bowel sounds. Extremities: no cyanosis, clubbing, rash, edema Neuro: alert & orientedx3, cranial nerves grossly intact. moves all 4 extremities w/o difficulty. Affect pleasant  Telemetry: NSR  Labs: Basic Metabolic Panel:  Recent Labs Lab 09/09/15 0502 09/10/15 0340 09/10/15 0904 09/11/15 0214 09/12/15 0408 09/13/15 0344  NA 139 140  --  138 136 137  K 4.0 3.9  --  3.9 3.9 4.5  CL 108 106  --  107 105 107  CO2 23 26  --  25 24 24   GLUCOSE 74 138*  --  87 92 94  BUN 11 18  --  11 9 10   CREATININE 0.98  1.14*  --  0.80 0.85 0.88  CALCIUM 9.4 8.9  --  9.0 9.0 9.4  MG  --   --  1.8 1.8  --   --     Liver Function Tests: No results for input(s): AST, ALT, ALKPHOS, BILITOT, PROT, ALBUMIN in the last 168 hours. No results for input(s): LIPASE, AMYLASE in the last 168 hours. No results for input(s): AMMONIA in the last 168 hours.  CBC:  Recent Labs Lab 09/08/15 1722 09/09/15 1630 09/12/15 0408 09/13/15 0344  WBC 8.0  --  8.9 7.9  NEUTROABS  --  6.5  --   --   HGB 10.2*  --  10.3* 10.2*  HCT 32.8*  --  33.0* 33.5*  MCV 82.8  --  84.6 86.3  PLT 198  --  251 242    Cardiac Enzymes: No results for input(s): CKTOTAL, CKMB, CKMBINDEX, TROPONINI in the last 168 hours.  BNP: BNP (last 3 results)  Recent Labs  02/03/15 2310 03/22/15 2015 06/05/15 0330  BNP 4003.4* >4500.0* 3619.4*    ProBNP (last 3 results) No results for input(s): PROBNP in the last 8760 hours.    Other results:  Imaging: No results found.   Medications:     Scheduled Medications: . amiodarone  100 mg Oral Daily  .  imipenem-cilastatin  500 mg Intravenous Q8H  . methocarbamol  500 mg Oral Daily  . potassium chloride SA  20 mEq Oral Daily  . spironolactone  12.5 mg Oral Daily  . torsemide  20 mg Oral QODAY  . Warfarin - Pharmacist Dosing Inpatient   Does not apply q1800    Infusions: . milrinone 0.25 mcg/kg/min (09/12/15 1322)    PRN Medications: magnesium oxide, oxyCODONE   Assessment:   1. 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 Recent MSSA/Klebsiella bacteremia & discitis 05/2015 3. Chronic systolic CHF/NICM on home milrinone: EF 10% 6/17 echo.  4. Chronic pericardial effusion: Mild to moderate 6/17 echo.  5. LV thrombus, on chronic Coumadin 6. PAF, maintaining NSR on amiodarone 7. NSVT  Plan/Discussion:    Remains on IV abx. HF stable with milrinone given through peripheral IV. Surveillance cultures drawn => blood cultures from 6/17 negative. -  May replace central access => will need tunneled catheter again, she agrees to this.  Coumadin held, INR down to 2.6.  If IR can do today, we can send home after line is in. - Will need 14 days ertepenem dating from 6/17.   Continue current milrinone, CHF is stable.   She can go home when she has central access.  Home cardiac meds: milrinone 0.25, coumadin per coumadin clinic, torsemide 20 qod, amiodarone 100 daily, KCl 20 qod with torsemide, spironolactone 12.5 daily.   Length of Stay: 4  [name] MD 09/13/2015, 8:24 AM  Advanced Heart Failure Team Pager [phone] (M-F; 7a - 4p)  Please contact [facility] for night-coverage after hours (4p -7a ) and weekends on [email or web address]

## 2015-09-14 ENCOUNTER — Other Ambulatory Visit: Payer: Medicare Other

## 2015-09-14 LAB — BASIC METABOLIC PANEL
Anion gap: 6 (ref 5–15)
BUN: 11 mg/dL (ref 6–20)
CALCIUM: 9.3 mg/dL (ref 8.9–10.3)
CO2: 21 mmol/L — AB (ref 22–32)
Chloride: 110 mmol/L (ref 101–111)
Creatinine, Ser: 0.79 mg/dL (ref 0.44–1.00)
GFR calc Af Amer: >60 mL/min (ref >60)
GLUCOSE: 86 mg/dL (ref 65–99)
POTASSIUM: 4.2 mmol/L (ref 3.5–5.1)
Sodium: 137 mmol/L (ref 135–145)

## 2015-09-14 LAB — PROTIME-INR
INR: 2.46 — AB (ref 0.00–1.49)
PROTHROMBIN TIME: 26.4 s — AB (ref 11.6–15.2)

## 2015-09-14 MED ORDER — SPIRONOLACTONE 25 MG PO TABS
25.0000 mg | ORAL_TABLET | Freq: Every day | ORAL | Status: DC
  Administered 2015-09-14 – 2015-09-16 (×3): 25 mg via ORAL
  Filled 2015-09-14 (×3): qty 1

## 2015-09-14 NOTE — Progress Notes (Signed)
Patient ID: Katrina Peck, female   DOB: 30-Jun-1945, 70 y.o.   MRN: [id number] P   Advanced Heart Failure Rounding Note   Subjective:    Feels fine this morning.  Repeat blood cultures remain negative.  BP stable.  INR 2.46 today.   Echo: EF 10%, severe LV dilation, small to moderate pericardial effusion.    Objective:   Weight Range:  Vital Signs:   Temp:  [97.6 F (36.4 C)-98.4 F (36.9 C)] 98.1 F (36.7 C) (06/21 0414) Pulse Rate:  [70-83] 77 (06/21 0414) Resp:  [18-20] 20 (06/21 0414) BP: (100-110)/(53-75) 107/66 mmHg (06/21 0414) SpO2:  [100 %] 100 % (06/21 0414) Weight:  [97 lb 9.6 oz (44.271 kg)] 97 lb 9.6 oz (44.271 kg) (06/21 0414) Last BM Date: 09/13/15  Weight change: Filed Weights   09/12/15 0425 09/13/15 0500 09/14/15 0414  Weight: 97 lb 14.4 oz (44.407 kg) 98 lb 3.2 oz (44.543 kg) 97 lb 9.6 oz (44.271 kg)    Intake/Output:   Intake/Output Summary (Last 24 hours) at 09/14/15 0850 Last data filed at 09/14/15 0600  Gross per 24 hour  Intake  284.2 ml  Output    720 ml  Net -435.8 ml     Physical Exam: General:  Well appearing. Lying in bed No resp difficulty HEENT: normal Neck: supple. JVP 7. Carotids 2+ bilat; no bruits. No lymphadenopathy or thryomegaly appreciated. Cor: PMI laterally displaced. Regular rate & rhythm. No rubs, gallops or murmurs. Lungs: clear Abdomen: soft, nontender, nondistended. No hepatosplenomegaly. No bruits or masses. Good bowel sounds. Extremities: no cyanosis, clubbing, rash, edema Neuro: alert & orientedx3, cranial nerves grossly intact. moves all 4 extremities w/o difficulty. Affect pleasant  Telemetry: NSR  Labs: Basic Metabolic Panel:  Recent Labs Lab 09/10/15 0340 09/10/15 0904 09/11/15 0214 09/12/15 0408 09/13/15 0344 09/14/15 0401  NA 140  --  138 136 137 137  K 3.9  --  3.9 3.9 4.5 4.2  CL 106  --  107 105 107 110  CO2 26  --  25 24 24  21*  GLUCOSE 138*  --  87 92 94 86  BUN 18  --  11 9 10 11     CREATININE 1.14*  --  0.80 0.85 0.88 0.79  CALCIUM 8.9  --  9.0 9.0 9.4 9.3  MG  --  1.8 1.8  --   --   --     Liver Function Tests: No results for input(s): AST, ALT, ALKPHOS, BILITOT, PROT, ALBUMIN in the last 168 hours. No results for input(s): LIPASE, AMYLASE in the last 168 hours. No results for input(s): AMMONIA in the last 168 hours.  CBC:  Recent Labs Lab 09/08/15 1722 09/09/15 1630 09/12/15 0408 09/13/15 0344  WBC 8.0  --  8.9 7.9  NEUTROABS  --  6.5  --   --   HGB 10.2*  --  10.3* 10.2*  HCT 32.8*  --  33.0* 33.5*  MCV 82.8  --  84.6 86.3  PLT 198  --  251 242    Cardiac Enzymes: No results for input(s): CKTOTAL, CKMB, CKMBINDEX, TROPONINI in the last 168 hours.  BNP: BNP (last 3 results)  Recent Labs  02/03/15 2310 03/22/15 2015 06/05/15 0330  BNP 4003.4* >4500.0* 3619.4*    ProBNP (last 3 results) No results for input(s): PROBNP in the last 8760 hours.    Other results:  Imaging: No results found.   Medications:     Scheduled Medications: . amiodarone  100  mg Oral Daily  . ertapenem  1 g Intravenous Q24H  . methocarbamol  500 mg Oral Daily  . potassium chloride SA  20 mEq Oral Daily  . spironolactone  25 mg Oral Daily  . torsemide  20 mg Oral QODAY  . Warfarin - Pharmacist Dosing Inpatient   Does not apply q1800    Infusions: . milrinone 0.25 mcg/kg/min (09/14/15 0132)    PRN Medications: magnesium oxide, oxyCODONE   Assessment:   1. 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 Recent MSSA/Klebsiella bacteremia & discitis 05/2015 3. Chronic systolic CHF/NICM on home milrinone: EF 10% 6/17 echo.  4. Chronic pericardial effusion: Mild to moderate 6/17 echo.  5. LV thrombus, on chronic Coumadin 6. PAF, maintaining NSR on amiodarone 7. NSVT  Plan/Discussion:    Remains on IV abx. HF stable with milrinone given through peripheral IV. Surveillance cultures drawn => blood cultures from 6/17  negative. - May replace central access => will need tunneled catheter again, she agrees to this.  Coumadin held, INR down to 2.47.  INR remains too high for tunneled catheter, will wait another day.  If INR can get down to around 2.2 or below tomorrow, hopefully IR will be willing to place. - Will need 14 days ertepenem dating from 6/17.   Continue current milrinone, CHF is stable.  Increase spironolactone to 25 mg daily.   She can go home when she has central access.  Home cardiac meds: milrinone 0.25, coumadin per coumadin clinic, torsemide 20 qod, amiodarone 100 daily, KCl 20 qod with torsemide, spironolactone 25 daily.   Length of Stay: 5  [name] MD 09/14/2015, 8:50 AM  Advanced Heart Failure Team Pager [phone] (M-F; 7a - 4p)  Please contact [facility] for night-coverage after hours (4p -7a ) and weekends on [email or web address]

## 2015-09-14 NOTE — Progress Notes (Signed)
ANTICOAGULATION CONSULT NOTE -Follow up  Pharmacy Consult for Coumadin Indication: h/o LV thrombus, low output CHF  No Known Allergies  Patient Measurements: Height: 5\' 3"  (160 cm) Weight: 97 lb 9.6 oz (44.271 kg) (scale b) IBW/kg (Calculated) : 52.4  Vital Signs: Temp: 97.9 F (36.6 C) (06/21 1115) Temp Source: Oral (06/21 1115) BP: 98/60 mmHg (06/21 1115) Pulse Rate: 75 (06/21 1115)  Labs:  Recent Labs  09/12/15 0408 09/13/15 0344 09/13/15 2155 09/14/15 0401  HGB 10.3* 10.2*  --   --   HCT 33.0* 33.5*  --   --   PLT 251 242  --   --   APTT  --   --  45*  --   LABPROT 30.1* 27.7*  --  26.4*  INR 2.94* 2.62*  --  2.46*  CREATININE 0.85 0.88  --  0.79    Estimated Creatinine Clearance: 46.4 mL/min (by C-G formula based on Cr of 0.79).  Assessment: 70 y.o. F presented to PCP with fever, chills, SOB - PICC line infection so advised to come to ED. o/p blood cx growing GNR per ED MD.  PMH: HTN, LV thrombus, low o/p CHF (home milrinone), afib, h/o MSSA bacteremia/discitis 05/2015, noncompliance  Anticoagulation: Coumadin PTA for LV thrombus. INR 1.58 (subtherapeutic) on admission.  Home dose: 4mg  daily - last taken 6/15 pm (last anticoag visit note 09/02/15 said to incr to 6mg  daily)  INR 2.4  Holding warfarin 6/19 for INR < 2 for tunneled catherter in IR hopefully Thur  Then restart wafarin no heparin bridge and d/c  Nephrology: SCr 0.88  Heme: H&H 10.2/33.5, Plt 242  Goal of Therapy:  INR 2-3 Monitor platelets by anticoagulation protocol: Yes    Plan:  Hold warfarin Daily INR, CBC q72h Monitor for s/sx of bleeding   Leota Sauers Pharm.D. CPP, BCPS Clinical Pharmacist [phone] 09/14/2015 11:50 AM

## 2015-09-14 NOTE — Progress Notes (Signed)
Spoke with Dr. Elisabeth Pigeon about pt INR 2.46. Radiology has to place when under 2.0. Dr. Elisabeth Pigeon said pt can have diet order back.

## 2015-09-14 NOTE — Progress Notes (Addendum)
Patient ID: Katrina Peck, female   DOB: 05/16/1945, 70 y.o.   MRN: [id number]  PROGRESS NOTE    Katrina Peck  QIW:[id number] DOB: 04-Nov-1945 DOA: 09/08/2015  PCP: Margit Hanks, MD   Brief Narrative:  70 y.o. female with past medical history of chronic diastolic CHF, ICM on milrinone, atrial fibrillation on coumadin, history of MSSA and Klebsiella bacteremia in March 2017 secondary to catheter related blood stream infection and associated T11/T12 discitis treated with IV Rocephin for 6 weeks who presented to Cardiology office on 6/13 with complaint of low grade fever and chills. Blood cultures were drawn and subsequently grew aerobic gram negative rods and patient was called and asked to come to the hospital for treatment and PICC line exchange. Tunneled catheter removed 09/11/2015. Plan is for catheter replacement once INR below 2. She will continue ertapenem on discharge through 09/24/2015. She will also continue milrinone drip per home regimen.  Assessment & Plan:  Enterobacter cloacae Bacteremia - Per infectious disease patient will continue ertapenem through 09/24/2015 which would be total of 2 week antibiotic treatment - Tunneled catheter removed 6/18 due to bacteremia  - Plan for tunneled catheter placement once INR less than 2 for which reason Coumadin is on hold - Admission blood cultures grew Enterobacter but repeat blood cultures with no growth to date  Chronic systolic CHF / NICM - Continue milrinone drip on discharge - Continue amiodarone, spironolactone and torsemide - 2-D echo on this admission with ejection fraction of 10%  LV Thrombus / Paroxysmal atrial fibrillation  - CHADS vasc score 4  - Coumadin on hold due to anticipated tunneled catheter placement once INR less than 2 - Continue amiodarone  - Blood pressure and heart rate controlled  Chronic pericardial effusion - 2 D ECHO 09/11/2015 - EF 10%, diffuse hypokinesis, small to moderate pericardial  effusion  DVT prophylaxis: Her Coumadin is on hold because of anticipated catheter placement once INR below 2 Family communication: husband anthony at the bedside this am  Code Status: Full Disposition: home once catheter placed    Consultants:  Cardiology   Infectious Disease  Procedures:  PICC line removal 6/16  2 D ECHO 09/11/2015 - EF 10%, diffuse hypokinesis, small to moderate pericardial effusion mostly posterior and lateral   Subjective: No overnight events. Says she feels good this morning.  Objective: Filed Vitals:   09/13/15 1056 09/13/15 1258 09/13/15 2015 09/14/15 0414  BP: 108/63 100/53 110/75 107/66  Pulse: 75 70 83 77  Temp: 97.6 F (36.4 C) 98 F (36.7 C) 98.4 F (36.9 C) 98.1 F (36.7 C)  TempSrc: Oral Oral Oral Oral  Resp: 18 20 20 20   Height:      Weight:    44.271 kg (97 lb 9.6 oz)  SpO2: 100% 100% 100% 100%    Intake/Output Summary (Last 24 hours) at 09/14/15 0857 Last data filed at 09/14/15 0600  Gross per 24 hour  Intake  284.2 ml  Output    720 ml  Net -435.8 ml   Filed Weights   09/12/15 0425 09/13/15 0500 09/14/15 0414  Weight: 44.407 kg (97 lb 14.4 oz) 44.543 kg (98 lb 3.2 oz) 44.271 kg (97 lb 9.6 oz)    Examination:  General exam: No acute distress  Respiratory system: No wheezing, no rhonchi  Cardiovascular system: S1 & S2 (+), rate controlled  Gastrointestinal system: (+) BS, non tender, non distended  Central nervous system: No focal neurological deficits. Extremities: No swelling, palpable pulses  Skin:  Warm and dry  Psychiatry: Normal mood and behavior  Data Reviewed: I have personally reviewed following labs and imaging studies  CBC:  Recent Labs Lab 09/08/15 1722 09/09/15 1630 09/12/15 0408 09/13/15 0344  WBC 8.0  --  8.9 7.9  NEUTROABS  --  6.5  --   --   HGB 10.2*  --  10.3* 10.2*  HCT 32.8*  --  33.0* 33.5*  MCV 82.8  --  84.6 86.3  PLT 198  --  251 242   Basic Metabolic Panel:  Recent Labs Lab  09/10/15 0340 09/10/15 0904 09/11/15 0214 09/12/15 0408 09/13/15 0344 09/14/15 0401  NA 140  --  138 136 137 137  K 3.9  --  3.9 3.9 4.5 4.2  CL 106  --  107 105 107 110  CO2 26  --  21*  GLUCOSE 138*  --  87 92 94 86  BUN 18  --  CREATININE 1.14*  --  0.80 0.85 0.88 0.79  CALCIUM 8.9  --  9.0 9.0 9.4 9.3  MG  --  1.8 1.8  --   --   --    GFR: Estimated Creatinine Clearance: 46.4 mL/min (by C-G formula based on Cr of 0.79). Liver Function Tests: No results for input(s): AST, ALT, ALKPHOS, BILITOT, PROT, ALBUMIN in the last 168 hours. No results for input(s): LIPASE, AMYLASE in the last 168 hours. No results for input(s): AMMONIA in the last 168 hours. Coagulation Profile:  Recent Labs Lab 09/10/15 0340 09/11/15 0214 09/12/15 0408 09/13/15 0344 09/14/15 0401  INR 2.64* 2.84* 2.94* 2.62* 2.46*   Cardiac Enzymes: No results for input(s): CKTOTAL, CKMB, CKMBINDEX, TROPONINI in the last 168 hours. BNP (last 3 results) No results for input(s): PROBNP in the last 8760 hours. HbA1C: No results for input(s): HGBA1C in the last 72 hours. CBG: No results for input(s): GLUCAP in the last 168 hours. Lipid Profile: No results for input(s): CHOL, HDL, LDLCALC, TRIG, CHOLHDL, LDLDIRECT in the last 72 hours. Thyroid Function Tests: No results for input(s): TSH, T4TOTAL, FREET4, T3FREE, THYROIDAB in the last 72 hours. Anemia Panel: No results for input(s): VITAMINB12, FOLATE, FERRITIN, TIBC, IRON, RETICCTPCT in the last 72 hours. Urine analysis:    Component Value Date/Time   COLORURINE AMBER* 09/09/2015 0222   APPEARANCEUR CLOUDY* 09/09/2015 0222   LABSPEC 1.029 09/09/2015 0222   PHURINE 6.0 09/09/2015 0222   GLUCOSEU NEGATIVE 09/09/2015 0222   HGBUR LARGE* 09/09/2015 0222   BILIRUBINUR NEGATIVE 09/09/2015 0222   KETONESUR 40* 09/09/2015 0222   PROTEINUR 100* 09/09/2015 0222   UROBILINOGEN 1.0 07/25/2014 2239   NITRITE NEGATIVE 09/09/2015 0222    LEUKOCYTESUR SMALL* 09/09/2015 0222   Sepsis Labs: (procalcitonin:4,lacticidven:4)   Recent Results (from the past 240 hour(s))  Blood culture (routine x 2)     Status: Abnormal   Collection Time: 09/08/15 11:55 PM  Result Value Ref Range Status   Specimen Description BLOOD LEFT ANTECUBITAL  Final   Special Requests BOTTLES DRAWN AEROBIC AND ANAEROBIC 5CC  Final   Culture  Setup Time   Final    GRAM NEGATIVE RODS IN BOTH AEROBIC AND ANAEROBIC BOTTLES CRITICAL RESULT CALLED TO, READ BACK BY AND VERIFIED WITH: Vernard Gambles, PHARMD  0617/17 MKELLY    Culture ENTEROBACTER CLOACAE (A)  Final   Report Status 09/12/2015 FINAL  Final   Organism ID, Bacteria ENTEROBACTER CLOACAE  Final      Susceptibility   Enterobacter  cloacae - MIC*    CEFAZOLIN >=64 RESISTANT Resistant     CEFEPIME <=1 SENSITIVE Sensitive     CEFTAZIDIME >=64 RESISTANT Resistant     CEFTRIAXONE >=64 RESISTANT Resistant     CIPROFLOXACIN <=0.25 SENSITIVE Sensitive     GENTAMICIN <=1 SENSITIVE Sensitive     IMIPENEM <=0.25 SENSITIVE Sensitive     TRIMETH/SULFA <=20 SENSITIVE Sensitive     PIP/TAZO >=128 RESISTANT Resistant     * ENTEROBACTER CLOACAE  Blood Culture ID Panel (Reflexed)     Status: Abnormal   Collection Time: 09/08/15 11:55 PM  Result Value Ref Range Status   Enterococcus species NOT DETECTED NOT DETECTED Final   Vancomycin resistance NOT DETECTED NOT DETECTED Final   Listeria monocytogenes NOT DETECTED NOT DETECTED Final   Staphylococcus species NOT DETECTED NOT DETECTED Final   Staphylococcus aureus NOT DETECTED NOT DETECTED Final   Methicillin resistance NOT DETECTED NOT DETECTED Final   Streptococcus species NOT DETECTED NOT DETECTED Final   Streptococcus agalactiae NOT DETECTED NOT DETECTED Final   Streptococcus pneumoniae NOT DETECTED NOT DETECTED Final   Streptococcus pyogenes NOT DETECTED NOT DETECTED Final   Acinetobacter baumannii NOT DETECTED NOT DETECTED Final    Enterobacteriaceae species DETECTED (A) NOT DETECTED Final    Comment: CRITICAL RESULT CALLED TO, READ BACK BY AND VERIFIED WITH: VERONDA BRYK, PHARMD @0449  09/10/15 MKELLY    Enterobacter cloacae complex DETECTED (A) NOT DETECTED Final    Comment: CRITICAL RESULT CALLED TO, READ BACK BY AND VERIFIED WITH: VERONDA BRYK,PHARMD @0449  09/10/15 MKELLY    Escherichia coli NOT DETECTED NOT DETECTED Final   Klebsiella oxytoca NOT DETECTED NOT DETECTED Final   Klebsiella pneumoniae NOT DETECTED NOT DETECTED Final   Proteus species NOT DETECTED NOT DETECTED Final   Serratia marcescens NOT DETECTED NOT DETECTED Final   Carbapenem resistance NOT DETECTED NOT DETECTED Final   Haemophilus influenzae NOT DETECTED NOT DETECTED Final   Neisseria meningitidis NOT DETECTED NOT DETECTED Final   Pseudomonas aeruginosa NOT DETECTED NOT DETECTED Final   Candida albicans NOT DETECTED NOT DETECTED Final   Candida glabrata NOT DETECTED NOT DETECTED Final   Candida krusei NOT DETECTED NOT DETECTED Final   Candida parapsilosis NOT DETECTED NOT DETECTED Final   Candida tropicalis NOT DETECTED NOT DETECTED Final  Blood culture (routine x 2)     Status: None (Preliminary result)   Collection Time: 09/09/15 12:05 AM  Result Value Ref Range Status   Specimen Description BLOOD RIGHT FOREARM  Final   Special Requests BOTTLES DRAWN AEROBIC AND ANAEROBIC 5CC  Final   Culture  Setup Time   Final    GRAM NEGATIVE RODS ANAEROBIC BOTTLE ONLY CRITICAL RESULT CALLED TO, READ BACK BY AND VERIFIED WITH: J. FRENS PHARMD AT 0815 09/13/15 BY D. VANHOOK    Culture GRAM NEGATIVE RODS IDENTIFICATION TO FOLLOW   Final   Report Status PENDING  Incomplete  Culture, blood (Routine X 2) w Reflex to ID Panel     Status: None (Preliminary result)   Collection Time: 09/10/15  3:08 PM  Result Value Ref Range Status   Specimen Description BLOOD LEFT ANTECUBITAL  Final   Special Requests BOTTLES DRAWN AEROBIC AND ANAEROBIC 5CC  Final    Culture NO GROWTH 3 DAYS  Final   Report Status PENDING  Incomplete  Culture, blood (Routine X 2) w Reflex to ID Panel     Status: None (Preliminary result)   Collection Time: 09/10/15  3:14 PM  Result Value Ref Range  Status   Specimen Description BLOOD BLOOD LEFT FOREARM  Final   Special Requests BOTTLES DRAWN AEROBIC ONLY 5CC  Final   Culture NO GROWTH 3 DAYS  Final   Report Status PENDING  Incomplete      Radiology Studies: No results found.     Scheduled Meds: . amiodarone  100 mg Oral Daily  . ertapenem  1 g Intravenous Q24H  . methocarbamol  500 mg Oral Daily  . potassium chloride SA  20 mEq Oral Daily  . spironolactone  25 mg Oral Daily  . torsemide  20 mg Oral QODAY  . Warfarin - Pharmacist Dosing Inpatient   Does not apply q1800   Continuous Infusions: . milrinone 0.25 mcg/kg/min (09/14/15 0132)     LOS: 5 days    Time spent: 15 minutes  Greater than 50% of the time spent on counseling and coordinating the care.   Manson Passey, MD [facility] Pager [phone]  If 7PM-7AM, please contact night-coverage [email or web address] Password [facility] 09/14/2015, 8:57 AM

## 2015-09-15 ENCOUNTER — Inpatient Hospital Stay (HOSPITAL_COMMUNITY): Payer: Medicare Other

## 2015-09-15 LAB — BASIC METABOLIC PANEL
Anion gap: 5 (ref 5–15)
BUN: 16 mg/dL (ref 6–20)
CALCIUM: 9.4 mg/dL (ref 8.9–10.3)
CO2: 24 mmol/L (ref 22–32)
CREATININE: 0.92 mg/dL (ref 0.44–1.00)
Chloride: 108 mmol/L (ref 101–111)
GFR calc Af Amer: >60 mL/min (ref >60)
GFR calc non Af Amer: >60 mL/min (ref >60)
GLUCOSE: 100 mg/dL — AB (ref 65–99)
Potassium: 4.5 mmol/L (ref 3.5–5.1)
Sodium: 137 mmol/L (ref 135–145)

## 2015-09-15 LAB — CBC
HCT: 30.4 % — ABNORMAL LOW (ref 36.0–46.0)
Hemoglobin: 9.5 g/dL — ABNORMAL LOW (ref 12.0–15.0)
MCH: 25.9 pg — AB (ref 26.0–34.0)
MCHC: 31.3 g/dL (ref 30.0–36.0)
MCV: 82.8 fL (ref 78.0–100.0)
PLATELETS: 278 10*3/uL (ref 150–400)
RBC: 3.67 MIL/uL — ABNORMAL LOW (ref 3.87–5.11)
RDW: 16.3 % — AB (ref 11.5–15.5)
WBC: 6.7 10*3/uL (ref 4.0–10.5)

## 2015-09-15 LAB — CULTURE, BLOOD (ROUTINE X 2)
CULTURE: NO GROWTH
CULTURE: NO GROWTH

## 2015-09-15 LAB — PROTIME-INR
INR: 2.11 — AB (ref 0.00–1.49)
PROTHROMBIN TIME: 23.5 s — AB (ref 11.6–15.2)

## 2015-09-15 MED ORDER — FENTANYL CITRATE (PF) 100 MCG/2ML IJ SOLN
INTRAMUSCULAR | Status: AC
  Filled 2015-09-15: qty 2

## 2015-09-15 MED ORDER — MIDAZOLAM HCL 2 MG/2ML IJ SOLN
INTRAMUSCULAR | Status: AC | PRN
  Administered 2015-09-15 (×2): 1 mg via INTRAVENOUS

## 2015-09-15 MED ORDER — MIDAZOLAM HCL 2 MG/2ML IJ SOLN
INTRAMUSCULAR | Status: AC
  Filled 2015-09-15: qty 2

## 2015-09-15 MED ORDER — WARFARIN SODIUM 5 MG PO TABS
5.0000 mg | ORAL_TABLET | Freq: Once | ORAL | Status: AC
  Administered 2015-09-15: 5 mg via ORAL
  Filled 2015-09-15: qty 1

## 2015-09-15 MED ORDER — FENTANYL CITRATE (PF) 100 MCG/2ML IJ SOLN
INTRAMUSCULAR | Status: AC | PRN
  Administered 2015-09-15 (×2): 25 ug via INTRAVENOUS

## 2015-09-15 MED ORDER — SODIUM CHLORIDE 0.9% FLUSH: 10.0000 mL | INTRAVENOUS | Status: DC | PRN

## 2015-09-15 MED ORDER — LIDOCAINE HCL 1 % IJ SOLN
INTRAMUSCULAR | Status: AC
  Filled 2015-09-15: qty 20

## 2015-09-15 NOTE — Progress Notes (Signed)
ANTICOAGULATION CONSULT NOTE -Follow up  Pharmacy Consult for Coumadin Indication: h/o LV thrombus, low output CHF  No Known Allergies  Patient Measurements: Height: 5\' 3"  (160 cm) Weight: 97 lb 12.8 oz (44.362 kg) (scale b) IBW/kg (Calculated) : 52.4  Vital Signs: Temp: 97.8 F (36.6 C) (06/22 0557) Temp Source: Oral (06/22 0557) BP: 131/76 mmHg (06/22 0941) Pulse Rate: 81 (06/22 0941)  Labs:  Recent Labs  09/13/15 0344 09/13/15 2155 09/14/15 0401 09/15/15 0305  HGB 10.2*  --   --  9.5*  HCT 33.5*  --   --  30.4*  PLT 242  --   --  278  APTT  --  45*  --   --   LABPROT 27.7*  --  26.4* 23.5*  INR 2.62*  --  2.46* 2.11*  CREATININE 0.88  --  0.79 0.92    Estimated Creatinine Clearance: 40.5 mL/min (by C-G formula based on Cr of 0.92).  Assessment: 70 y.o. F presented to PCP with fever, chills, SOB - PICC line infection so advised to come to ED. o/p blood cx growing GNR per ED MD.  PMH: HTN, LV thrombus, low o/p CHF (home milrinone), afib, h/o MSSA bacteremia/discitis 05/2015, noncompliance  Anticoagulation: Coumadin PTA for LV thrombus. INR 1.58 (subtherapeutic) on admission.  Home dose: 4mg  daily - last taken 6/15 pm (last anticoag visit note 09/02/15 said to incr to 6mg  daily)  INR 2.11  To resume warfarin this evening post line placement  Nephrology: SCr 0.92  Heme: H&H 9.5/30.4, Plt 278  Goal of Therapy:  INR 2-3 Monitor platelets by anticoagulation protocol: Yes    Plan:  Warfarin 5 mg x 1 Daily INR, CBC q72h Monitor for s/sx of bleeding  Isaac Bliss, PharmD, BCPS, [facility] Clinical Pharmacist Pager [phone] 09/15/2015 9:42 AM

## 2015-09-15 NOTE — Sedation Documentation (Signed)
Patient is resting comfortably. 

## 2015-09-15 NOTE — Progress Notes (Addendum)
Patient ID: Katrina Peck, female   DOB: 23-Apr-1945, 70 y.o.   MRN: [id number]  PROGRESS NOTE    TANGIE STAY  WUJ:[id number] DOB: 1945-12-14 DOA: 09/08/2015  PCP: Margit Hanks, MD   Brief Narrative:  70 y.o. female with past medical history of chronic diastolic CHF, ICM on milrinone, atrial fibrillation on coumadin, history of MSSA and Klebsiella bacteremia in March 2017 secondary to catheter related blood stream infection and associated T11/T12 discitis treated with IV Rocephin for 6 weeks who presented to Cardiology office on 6/13 with complaint of low grade fever and chills. Blood cultures were drawn and subsequently grew aerobic gram negative rods and patient was called and asked to come to the hospital for treatment and PICC line exchange. Tunneled catheter removed 09/11/2015. Plan is for catheter replacement once INR below 2. She will continue ertapenem on discharge through 09/24/2015. She will also continue milrinone drip per home regimen.  Assessment & Plan:     Assessment & Plan:  Enterobacter cloacae Bacteremia - Per infectious disease patient will continue ertapenem through 09/24/2015 which would be total of 2 week antibiotic treatment - Tunneled catheter removed 6/18 due to bacteremia  - Plan for tunneled catheter placement today - Blood cultures grew Enterobacter on admission but repeat blood cultures with no growth to date  Chronic systolic CHF / NICM - Continue milrinone drip on discharge - Continue amiodarone, spironolactone and torsemide - 2-D echo on this admission with ejection fraction of 10%  LV Thrombus / Paroxysmal atrial fibrillation  - CHADS vasc score 4  - Coumadin on hold due to anticipated tunneled catheter placement this am - Continue amiodarone    Chronic pericardial effusion - 2 D ECHO 09/11/2015 - EF 10%, diffuse hypokinesis, small to moderate pericardial effusion  DVT prophylaxis: Her Coumadin is on hold because of anticipated catheter  placement once INR below 2 Family communication: husband anthony at the bedside this am  Code Status: Full Disposition: home 6/23 if stable, no bleeding after procedure    Consultants:  Cardiology   Infectious Disease  Procedures:  PICC line removal 6/16  2 D ECHO 09/11/2015 - EF 10%, diffuse hypokinesis, small to moderate pericardial effusion mostly posterior and lateral   Subjective: Feels okay.  Objective: Filed Vitals:   09/15/15 1000 09/15/15 1005 09/15/15 1010 09/15/15 1043  BP: 111/72 115/76 115/76 97/65  Pulse: 72 76 76 74  Temp:    97.6 F (36.4 C)  TempSrc:    Oral  Resp: Height:      Weight:      SpO2: 100% 100% 100% 99%    Intake/Output Summary (Last 24 hours) at 09/15/15 1056 Last data filed at 09/15/15 1048  Gross per 24 hour  Intake 1004.2 ml  Output   1150 ml  Net -145.8 ml   Filed Weights   09/13/15 0500 09/14/15 0414 09/15/15 0609  Weight: 44.543 kg (98 lb 3.2 oz) 44.271 kg (97 lb 9.6 oz) 44.362 kg (97 lb 12.8 oz)    Examination:  General exam: Appears calm and comfortable  Respiratory system: Clear to auscultation. Respiratory effort normal. Cardiovascular system: S1 & S2 heard, RRR.  Gastrointestinal system: Abdomen is nondistended, soft and nontender. No organomegaly or masses felt. Normal bowel sounds heard. Central nervous system: Alert and oriented. No focal neurological deficits. Extremities: Symmetric 5 x 5 power. Skin: No rashes, lesions or ulcers Psychiatry: Judgement and insight appear normal. Mood & affect appropriate.   Data Reviewed:  I have personally reviewed following labs and imaging studies  CBC:  Recent Labs Lab 09/08/15 1722 09/09/15 1630 09/12/15 0408 09/13/15 0344 09/15/15 0305  WBC 8.0  --  8.9 7.9 6.7  NEUTROABS  --  6.5  --   --   --   HGB 10.2*  --  10.3* 10.2* 9.5*  HCT 32.8*  --  33.0* 33.5* 30.4*  MCV 82.8  --  84.6 86.3 82.8  PLT 198  --  251 242 278   Basic Metabolic  Panel:  Recent Labs Lab 09/10/15 0904 09/11/15 0214 09/12/15 0408 09/13/15 0344 09/14/15 0401 09/15/15 0305  NA  --  138 136 137 137 137  K  --  3.9 3.9 4.5 4.2 4.5  CL  --  107 105 107 110 108  CO2  --  21* 24  GLUCOSE  --  87 92 94 86 100*  BUN  --  CREATININE  --  0.80 0.85 0.88 0.79 0.92  CALCIUM  --  9.0 9.0 9.4 9.3 9.4  MG 1.8 1.8  --   --   --   --    GFR: Estimated Creatinine Clearance: 40.5 mL/min (by C-G formula based on Cr of 0.92). Liver Function Tests: No results for input(s): AST, ALT, ALKPHOS, BILITOT, PROT, ALBUMIN in the last 168 hours. No results for input(s): LIPASE, AMYLASE in the last 168 hours. No results for input(s): AMMONIA in the last 168 hours. Coagulation Profile:  Recent Labs Lab 09/11/15 0214 09/12/15 0408 09/13/15 0344 09/14/15 0401 09/15/15 0305  INR 2.84* 2.94* 2.62* 2.46* 2.11*   Cardiac Enzymes: No results for input(s): CKTOTAL, CKMB, CKMBINDEX, TROPONINI in the last 168 hours. BNP (last 3 results) No results for input(s): PROBNP in the last 8760 hours. HbA1C: No results for input(s): HGBA1C in the last 72 hours. CBG: No results for input(s): GLUCAP in the last 168 hours. Lipid Profile: No results for input(s): CHOL, HDL, LDLCALC, TRIG, CHOLHDL, LDLDIRECT in the last 72 hours. Thyroid Function Tests: No results for input(s): TSH, T4TOTAL, FREET4, T3FREE, THYROIDAB in the last 72 hours. Anemia Panel: No results for input(s): VITAMINB12, FOLATE, FERRITIN, TIBC, IRON, RETICCTPCT in the last 72 hours. Urine analysis:    Component Value Date/Time   COLORURINE AMBER* 09/09/2015 0222   APPEARANCEUR CLOUDY* 09/09/2015 0222   LABSPEC 1.029 09/09/2015 0222   PHURINE 6.0 09/09/2015 0222   GLUCOSEU NEGATIVE 09/09/2015 0222   HGBUR LARGE* 09/09/2015 0222   BILIRUBINUR NEGATIVE 09/09/2015 0222   KETONESUR 40* 09/09/2015 0222   PROTEINUR 100* 09/09/2015 0222   UROBILINOGEN 1.0 07/25/2014 2239   NITRITE  NEGATIVE 09/09/2015 0222   LEUKOCYTESUR SMALL* 09/09/2015 0222   Sepsis Labs: (procalcitonin:4,lacticidven:4)   Recent Results (from the past 240 hour(s))  Blood culture (routine x 2)     Status: Abnormal   Collection Time: 09/08/15 11:55 PM  Result Value Ref Range Status   Specimen Description BLOOD LEFT ANTECUBITAL  Final   Special Requests BOTTLES DRAWN AEROBIC AND ANAEROBIC 5CC  Final   Culture  Setup Time   Final    GRAM NEGATIVE RODS IN BOTH AEROBIC AND ANAEROBIC BOTTLES CRITICAL RESULT CALLED TO, READ BACK BY AND VERIFIED WITH: Vernard Gambles, PHARMD  0617/17 MKELLY    Culture ENTEROBACTER CLOACAE (A)  Final   Report Status 09/12/2015 FINAL  Final   Organism ID, Bacteria ENTEROBACTER CLOACAE  Final      Susceptibility   Enterobacter cloacae -  MIC*    CEFAZOLIN >=64 RESISTANT Resistant     CEFEPIME <=1 SENSITIVE Sensitive     CEFTAZIDIME >=64 RESISTANT Resistant     CEFTRIAXONE >=64 RESISTANT Resistant     CIPROFLOXACIN <=0.25 SENSITIVE Sensitive     GENTAMICIN <=1 SENSITIVE Sensitive     IMIPENEM <=0.25 SENSITIVE Sensitive     TRIMETH/SULFA <=20 SENSITIVE Sensitive     PIP/TAZO >=128 RESISTANT Resistant     * ENTEROBACTER CLOACAE  Blood Culture ID Panel (Reflexed)     Status: Abnormal   Collection Time: 09/08/15 11:55 PM  Result Value Ref Range Status   Enterococcus species NOT DETECTED NOT DETECTED Final   Vancomycin resistance NOT DETECTED NOT DETECTED Final   Listeria monocytogenes NOT DETECTED NOT DETECTED Final   Staphylococcus species NOT DETECTED NOT DETECTED Final   Staphylococcus aureus NOT DETECTED NOT DETECTED Final   Methicillin resistance NOT DETECTED NOT DETECTED Final   Streptococcus species NOT DETECTED NOT DETECTED Final   Streptococcus agalactiae NOT DETECTED NOT DETECTED Final   Streptococcus pneumoniae NOT DETECTED NOT DETECTED Final   Streptococcus pyogenes NOT DETECTED NOT DETECTED Final   Acinetobacter baumannii NOT DETECTED NOT  DETECTED Final   Enterobacteriaceae species DETECTED (A) NOT DETECTED Final    Comment: CRITICAL RESULT CALLED TO, READ BACK BY AND VERIFIED WITH: VERONDA BRYK, PHARMD @0449  09/10/15 MKELLY    Enterobacter cloacae complex DETECTED (A) NOT DETECTED Final    Comment: CRITICAL RESULT CALLED TO, READ BACK BY AND VERIFIED WITH: VERONDA BRYK,PHARMD @0449  09/10/15 MKELLY    Escherichia coli NOT DETECTED NOT DETECTED Final   Klebsiella oxytoca NOT DETECTED NOT DETECTED Final   Klebsiella pneumoniae NOT DETECTED NOT DETECTED Final   Proteus species NOT DETECTED NOT DETECTED Final   Serratia marcescens NOT DETECTED NOT DETECTED Final   Carbapenem resistance NOT DETECTED NOT DETECTED Final   Haemophilus influenzae NOT DETECTED NOT DETECTED Final   Neisseria meningitidis NOT DETECTED NOT DETECTED Final   Pseudomonas aeruginosa NOT DETECTED NOT DETECTED Final   Candida albicans NOT DETECTED NOT DETECTED Final   Candida glabrata NOT DETECTED NOT DETECTED Final   Candida krusei NOT DETECTED NOT DETECTED Final   Candida parapsilosis NOT DETECTED NOT DETECTED Final   Candida tropicalis NOT DETECTED NOT DETECTED Final  Blood culture (routine x 2)     Status: Abnormal   Collection Time: 09/09/15 12:05 AM  Result Value Ref Range Status   Specimen Description BLOOD RIGHT FOREARM  Final   Special Requests BOTTLES DRAWN AEROBIC AND ANAEROBIC 5CC  Final   Culture  Setup Time   Final    GRAM NEGATIVE RODS ANAEROBIC BOTTLE ONLY CRITICAL RESULT CALLED TO, READ BACK BY AND VERIFIED WITH: J. FRENS PHARMD AT 0815 09/13/15 BY D. VANHOOK    Culture (A)  Final    ENTEROBACTER CLOACAE SUSCEPTIBILITIES PERFORMED ON PREVIOUS CULTURE WITHIN THE LAST 5 DAYS.    Report Status 09/15/2015 FINAL  Final  Culture, blood (Routine X 2) w Reflex to ID Panel     Status: None (Preliminary result)   Collection Time: 09/10/15  3:08 PM  Result Value Ref Range Status   Specimen Description BLOOD LEFT ANTECUBITAL  Final    Special Requests BOTTLES DRAWN AEROBIC AND ANAEROBIC 5CC  Final   Culture NO GROWTH 4 DAYS  Final   Report Status PENDING  Incomplete  Culture, blood (Routine X 2) w Reflex to ID Panel     Status: None (Preliminary result)   Collection Time: 09/10/15  3:14 PM  Result Value Ref Range Status   Specimen Description BLOOD BLOOD LEFT FOREARM  Final   Special Requests BOTTLES DRAWN AEROBIC ONLY 5CC  Final   Culture NO GROWTH 4 DAYS  Final   Report Status PENDING  Incomplete      Radiology Studies: No results found.      Scheduled Meds: . amiodarone  100 mg Oral Daily  . ertapenem  1 g Intravenous Q24H  . fentaNYL      . lidocaine      . methocarbamol  500 mg Oral Daily  . midazolam      . potassium chloride SA  20 mEq Oral Daily  . spironolactone  25 mg Oral Daily  . torsemide  20 mg Oral QODAY  . warfarin  5 mg Oral ONCE-1800  . Warfarin - Pharmacist Dosing Inpatient   Does not apply q1800   Continuous Infusions: . milrinone 0.25 mcg/kg/min (09/14/15 0132)     LOS: 6 days    Time spent: 15 minutes  Greater than 50% of the time spent on counseling and coordinating the care.   Manson Passey, MD [facility] Pager [phone]  If 7PM-7AM, please contact night-coverage [email or web address] Password [facility] 09/15/2015, 10:56 AM

## 2015-09-15 NOTE — Procedures (Signed)
RIJV tunneled PICC SVC RA No comp/EBL 

## 2015-09-15 NOTE — Progress Notes (Addendum)
Patient ID: Katrina Peck, female   DOB: December 22, 1945, 70 y.o.   MRN: [id number] P   Advanced Heart Failure Rounding Note   Subjective:    Feels fine this morning.  Repeat blood cultures remain negative.  BP stable.  INR 2.1 today.  Tunneled catheter placed today.   Echo: EF 10%, severe LV dilation, small to moderate pericardial effusion.    Objective:   Weight Range:  Vital Signs:   Temp:  [97.6 F (36.4 C)-98.1 F (36.7 C)] 97.6 F (36.4 C) (06/22 1043) Pulse Rate:  [70-92] 70 (06/22 1134) Resp:  [15-21] 18 (06/22 1043) BP: (91-135)/(58-92) 97/62 mmHg (06/22 1134) SpO2:  [99 %-100 %] 99 % (06/22 1043) Weight:  [97 lb 12.8 oz (44.362 kg)] 97 lb 12.8 oz (44.362 kg) (06/22 0609) Last BM Date: 09/14/15  Weight change: Filed Weights   09/13/15 0500 09/14/15 0414 09/15/15 0609  Weight: 98 lb 3.2 oz (44.543 kg) 97 lb 9.6 oz (44.271 kg) 97 lb 12.8 oz (44.362 kg)    Intake/Output:   Intake/Output Summary (Last 24 hours) at 09/15/15 1313 Last data filed at 09/15/15 1048  Gross per 24 hour  Intake 1004.2 ml  Output   1050 ml  Net  -45.8 ml     Physical Exam: General:  Well appearing. Lying in bed No resp difficulty HEENT: normal Neck: supple. JVP 7. Carotids 2+ bilat; no bruits. No lymphadenopathy or thryomegaly appreciated. Cor: PMI laterally displaced. Regular rate & rhythm. No rubs, gallops or murmurs. Lungs: clear Abdomen: soft, nontender, nondistended. No hepatosplenomegaly. No bruits or masses. Good bowel sounds. Extremities: no cyanosis, clubbing, rash, edema Neuro: alert & orientedx3, cranial nerves grossly intact. moves all 4 extremities w/o difficulty. Affect pleasant  Telemetry: NSR  Labs: Basic Metabolic Panel:  Recent Labs Lab 09/10/15 0904 09/11/15 0214 09/12/15 0408 09/13/15 0344 09/14/15 0401 09/15/15 0305  NA  --  138 136 137 137 137  K  --  3.9 3.9 4.5 4.2 4.5  CL  --  107 105 107 110 108  CO2  --  21* 24  GLUCOSE  --  87 92 94 86  100*  BUN  --  CREATININE  --  0.80 0.85 0.88 0.79 0.92  CALCIUM  --  9.0 9.0 9.4 9.3 9.4  MG 1.8 1.8  --   --   --   --     Liver Function Tests: No results for input(s): AST, ALT, ALKPHOS, BILITOT, PROT, ALBUMIN in the last 168 hours. No results for input(s): LIPASE, AMYLASE in the last 168 hours. No results for input(s): AMMONIA in the last 168 hours.  CBC:  Recent Labs Lab 09/08/15 1722 09/09/15 1630 09/12/15 0408 09/13/15 0344 09/15/15 0305  WBC 8.0  --  8.9 7.9 6.7  NEUTROABS  --  6.5  --   --   --   HGB 10.2*  --  10.3* 10.2* 9.5*  HCT 32.8*  --  33.0* 33.5* 30.4*  MCV 82.8  --  84.6 86.3 82.8  PLT 198  --  251 242 278    Cardiac Enzymes: No results for input(s): CKTOTAL, CKMB, CKMBINDEX, TROPONINI in the last 168 hours.  BNP: BNP (last 3 results)  Recent Labs  02/03/15 2310 03/22/15 2015 06/05/15 0330  BNP 4003.4* >4500.0* 3619.4*    ProBNP (last 3 results) No results for input(s): PROBNP in the last 8760 hours.    Other results:  Imaging: No results  found.   Medications:     Scheduled Medications: . amiodarone  100 mg Oral Daily  . ertapenem  1 g Intravenous Q24H  . fentaNYL      . lidocaine      . methocarbamol  500 mg Oral Daily  . midazolam      . potassium chloride SA  20 mEq Oral Daily  . spironolactone  25 mg Oral Daily  . torsemide  20 mg Oral QODAY  . warfarin  5 mg Oral ONCE-1800  . Warfarin - Pharmacist Dosing Inpatient   Does not apply q1800    Infusions: . milrinone 0.25 mcg/kg/min (09/14/15 0132)    PRN Medications: magnesium oxide, oxyCODONE   Assessment:   1. 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 Recent MSSA/Klebsiella bacteremia & discitis 05/2015 3. Chronic systolic CHF/NICM on home milrinone: EF 10% 6/17 echo.  4. Chronic pericardial effusion: Mild to moderate 6/17 echo.  5. LV thrombus, on chronic Coumadin 6. PAF, maintaining NSR on amiodarone 7.  NSVT  Plan/Discussion:    Remains on IV abx. HF stable with milrinone given through peripheral IV. Surveillance cultures drawn => blood cultures from 6/17 negative. - May replace central access => will need tunneled catheter again, she agrees to this.  Coumadin held, INR down to 2.47.  INR remains too high for tunneled catheter, will wait another day.  If INR can get down to around 2.2 or below tomorrow, hopefully IR will be willing to place. - Will need 14 days ertepenem dating from 6/17.   Continue current milrinone, CHF is stable.  Increase spironolactone to 25 mg daily.   From my perspective, she can go home now that she has central access.  Home cardiac meds: milrinone 0.25, coumadin per coumadin clinic, torsemide 20 qod, amiodarone 100 daily, KCl 20 qod with torsemide, spironolactone 25 daily.    We've arranged followup for her.  Will sign off, call with questions.   Length of Stay: 6  [name] MD 09/15/2015, 1:13 PM  Advanced Heart Failure Team Pager [phone] (M-F; 7a - 4p)  Please contact [facility] for night-coverage after hours (4p -7a ) and weekends on [email or web address]

## 2015-09-16 ENCOUNTER — Other Ambulatory Visit (HOSPITAL_COMMUNITY): Payer: Self-pay | Admitting: Cardiology

## 2015-09-16 LAB — BASIC METABOLIC PANEL
Anion gap: 6 (ref 5–15)
BUN: 19 mg/dL (ref 6–20)
CALCIUM: 9.3 mg/dL (ref 8.9–10.3)
CHLORIDE: 106 mmol/L (ref 101–111)
CO2: 26 mmol/L (ref 22–32)
Creatinine, Ser: 0.99 mg/dL (ref 0.44–1.00)
GFR calc non Af Amer: 57 mL/min — ABNORMAL LOW (ref >60)
GLUCOSE: 104 mg/dL — AB (ref 65–99)
Potassium: 4.1 mmol/L (ref 3.5–5.1)
SODIUM: 138 mmol/L (ref 135–145)

## 2015-09-16 LAB — PROTIME-INR
INR: 1.82 — ABNORMAL HIGH (ref 0.00–1.49)
PROTHROMBIN TIME: 21 s — AB (ref 11.6–15.2)

## 2015-09-16 LAB — MAGNESIUM: Magnesium: 1.9 mg/dL (ref 1.7–2.4)

## 2015-09-16 MED ORDER — WARFARIN SODIUM 2 MG PO TABS: 6.0000 mg | ORAL_TABLET | Freq: Every day | ORAL | Status: DC

## 2015-09-16 NOTE — Progress Notes (Signed)
Pt has orders to be discharged. Discharge instructions given and pt has no additional questions at this time. Medication regimen reviewed and pt educated. Pt verbalized understanding and has no additional questions. Telemetry box removed. Home Health to come to bedside to resume home milrinone. Pt stable and waiting for transportation.   Jilda Panda RN

## 2015-09-16 NOTE — Discharge Summary (Signed)
Physician Discharge Summary  Katrina Peck ZOX:[id number] DOB: Feb 07, 1946 DOA: 09/08/2015  PCP: Margit Hanks, MD  Admit date: 09/08/2015 Discharge date: 09/16/2015  Recommendations for Outpatient Follow-up:  Continue ertapenem through 09/24/2015 Continue milrinone drip per home regimen as per prior to the admission Please note change in coumadin is 6 mg at bedtime. Follow up with PCP monday to recheck IR and adjust coumadin dose accordingly to reflect INR 2-3 range.  Discharge Diagnoses:  Principal Problem:   Bacteremia Active Problems:   NICM (nonischemic cardiomyopathy) (HCC)   Pericardial effusion   Chronic systolic heart failure (HCC)   Paroxysmal atrial fibrillation (HCC)   LV (left ventricular) mural thrombus (HCC)   SOB (shortness of breath)   Fever, unspecified   Gram-negative infection   PICC line infection    Discharge Condition: stable   Diet recommendation: as tolerated   History of present illness:   Per brief narrative 09/11/2015 "70 y.o. 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 Blood cultures were drawn along with CBC, BMET. Blood cultures grew aerobic gram negative rods and patient was called and asked to come to the hospital for treatment and PICC line exchange."  Hospital Course:   Assessment & Plan:  Enterobacter cloacae Bacteremia - Per infectious disease patient will continue ertapenem through 09/24/2015 which would be total of 2 week antibiotic treatment - Tunneled catheter removed 6/18 due to bacteremia  - Catheter placed 6/22 without complications  - Blood cultures grew Enterobacter but repeat blood cultures with no growth to date - Patient  seen by infectious disease in consultation  Chronic systolic CHF / NICM - Continue milrinone drip - Continue amiodarone, spironolactone and torsemide - Cardiology following - 2-D echo on this admission with ejection fraction of 10% - Stable respiratory status  LV Thrombus  - Continue Coumadin, 6 mg at bedtime on discharge  Chronic pericardial effusion - 2 D ECHO 09/11/2015 - EF 10%, diffuse hypokinesis, small to moderate pericardial effusion  DVT prophylaxis: warfarin on discharge  Code Status: Full   Consultants:  Cardiology   Infectious Disease  Procedures:  PICC line removal 6/16  2 D ECHO 09/11/2015 - EF 10%, diffuse hypokinesis, small to moderate pericardial effusion mostly posterior and lateral    Signed:  Manson Passey, MD  [facility] 09/16/2015, 11:06 AM  Pager #: [phone]  Time spent in minutes: less than 30 minutes  Discharge Exam: Filed Vitals:   09/15/15 2116 09/16/15 0557  BP: 99/65 93/52  Pulse: 74 80  Temp: 98.7 F (37.1 C) 97.8 F (36.6 C)  Resp: 15 18   Filed Vitals:   09/15/15 1043 09/15/15 1134 09/15/15 2116 09/16/15 0557  BP: 97/65 97/62 99/65  93/52  Pulse: 74 70 74 80  Temp: 97.6 F (36.4 C)  98.7 F (37.1 C) 97.8 F (36.6 C)  TempSrc: Oral  Oral Oral  Resp: 18  15 18   Height:      Weight:    42.729 kg (94 lb 3.2 oz)  SpO2: 99%  100% 100%    General: Pt is alert, follows commands appropriately, not in acute distress, catheter on the right side, neck Cardiovascular: Regular rate and rhythm, S1/S2 + Respiratory: Clear to auscultation bilaterally, no wheezing,  no crackles, no rhonchi Abdominal: Soft, non tender, non distended, bowel sounds +, no guarding Extremities: no edema, no cyanosis, pulses palpable bilaterally DP and PT Neuro: Grossly nonfocal  Discharge Instructions  Discharge Instructions    Call MD for:  difficulty breathing, headache or visual disturbances    Complete by:  As directed      Call MD  for:  persistant dizziness or light-headedness    Complete by:  As directed      Call MD for:  persistant nausea and vomiting    Complete by:  As directed      Call MD for:  severe uncontrolled pain    Complete by:  As directed      Diet - low sodium heart healthy    Complete by:  As directed      Discharge instructions    Complete by:  As directed   Continue imipenem through 09/24/2015 Continue milrinone drip per home regimen as per prior to the admission Please note change in coumadin is 5 mg at bedtime. Follow up with PCP in next 2-3 days to recheck IR and adjust coumadin dose accordingly to reflect INR 2-3 range.     Increase activity slowly    Complete by:  As directed             Medication List    TAKE these medications        amiodarone 100 MG tablet  Commonly known as:  PACERONE  Take 1 tablet (100 mg total) by mouth daily.     ertapenem 1 g in sodium chloride 0.9 % 50 mL  Inject 1 g into the vein daily.     magnesium oxide 400 MG tablet  Commonly known as:  MAG-OX  Take 200 mg by mouth daily as needed (leg cramping).     milrinone 20 MG/100 ML Soln infusion  Commonly known as:  PRIMACOR  Inject 11.25 mcg/min into the vein continuous.     oxyCODONE 5 MG immediate release tablet  Commonly known as:  Oxy IR/ROXICODONE  Take 2 tablets (10 mg total) by mouth every 4 (four) hours as needed for moderate pain or severe pain.     potassium chloride SA 20 MEQ tablet  Commonly known as:  K-DUR,KLOR-CON  Take 20 mEq by mouth daily.     sodium chloride 0.9 % SOLN with milrinone 1 MG/ML SOLN 200 mcg/mL  Inject 0.25 mcg/kg/min into the vein continuous. /140 ml Weight 45 kg     spironolactone 25 MG tablet  Commonly known as:  ALDACTONE  Take 0.5 tablets (12.5 mg total) by mouth daily.     torsemide 20 MG tablet  Commonly known as:  DEMADEX  Take 20 mg by mouth every other day.     warfarin 2 MG tablet  Commonly known as:  COUMADIN  Take 3 tablets (6 mg total)  by mouth daily.         Follow-up Information    Schedule an appointment as soon as possible for a visit with Margit Hanks, MD.   Specialty:  Internal Medicine   Contact information:   [address] [place] [address] [phone]       Follow up with [name], MD On 09/20/2015.   Specialty:  Cardiology   Why:  at 9:30 garage code [id number]   Contact information:   [address] [place] [place] [address] [phone]        The results of  significant diagnostics from this hospitalization (including imaging, microbiology, ancillary and laboratory) are listed below for reference.    Significant Diagnostic Studies: Dg Chest 1 View  09/09/2015  CLINICAL DATA:  Shortness of breath.  Possible PICC line infection. EXAM: CHEST 1 VIEW COMPARISON:  06/04/2015 FINDINGS: Diffuse enlargement of the cardiac silhouette which could be due to cardiac enlargement and/ or pericardial effusion. No focal airspace disease or consolidation in the lungs. Mild blunting of left costophrenic angle suggesting small pleural effusion. No pneumothorax. Mediastinal contours appear intact. Right central venous catheter with tip over the low SVC region. IMPRESSION: Diffuse enlargement of the cardiac silhouette. Small left pleural effusion. Electronically Signed   By: Burman Nieves M.D.   On: 09/09/2015 00:59   Ir Fluoro Guide Cv Line Right  09/15/2015  INDICATION: CHF EXAM: RIGHT JUGULAR TUNNELED PICC LINE PLACEMENT WITH ULTRASOUND AND FLUOROSCOPIC GUIDANCE MEDICATIONS: Patient was on antibiotics; The antibiotic was administered within an appropriate time interval prior to skin puncture. ANESTHESIA/SEDATION: Versed to mg IV; Fentanyl 50 mcg IV; Moderate Sedation Time:  20 The patient was continuously monitored during the procedure by the interventional radiology nurse under my direct supervision. FLUOROSCOPY TIME:  Fluoroscopy Time: 1 minutes 30 seconds (3 mGy). COMPLICATIONS: None  immediate. PROCEDURE: The patient was advised of the possible risks andcomplications and agreed to undergo the procedure. The patient was then brought to the angiographic suite for the procedure. The right neck was prepped with chlorhexidine, draped in the usual sterile fashion using maximum barrier technique (cap and mask, sterile gown, sterile gloves, large sterile sheet, hand hygiene and cutaneous antiseptic). Local anesthesia was attained by infiltration with 1% lidocaine. Ultrasound demonstrated patency of the right jugular vein, and this was documented with an image. Under real-time ultrasound guidance, this vein was accessed with a 21 gauge micropuncture needle and image documentation was performed. The needle was exchanged over a guidewire for a peel-away sheath through which a 20 cm 5 [place] single lumen power injectable tunnel PICC was advanced, and positioned with its tip at the lower SVC/right atrial junction. The cuff was positioned in the subcutaneous tract. Fluoroscopy during the procedure and fluoro spot radiograph confirms appropriate catheter position. The catheter was flushed, secured to the skin with Prolene sutures, and covered with a sterile dressing. FINDINGS: Tip of the PICC is at the cavoatrial junction. IMPRESSION: Successful placement of a tunneled right jugular PICC with sonographic and fluoroscopic guidance. The catheter is ready for use. Electronically Signed   By: Jolaine Click M.D.   On: 09/15/2015 15:26   Ir Removal Tun Cv Cath W/o Fl  09/09/2015  INDICATION: Bacteremia. Request for removal of tunneled central venous catheter. Initially placed by Dr. Deanne Coffer on June 10, 2015 for milrinone use. EXAM: REMOVAL OF TUNNELED CENTRAL VENOUS CATHETER MEDICATIONS: None. ANESTHESIA/SEDATION: No sedation medication given. FLUOROSCOPY TIME:  Fluoroscopy not used. COMPLICATIONS: None immediate. PROCEDURE: Informed written consent was obtained from the patient following an explanation of the  procedure, risks, benefits and alternatives to treatment. A time out was performed prior to the initiation of the procedure. Sterile technique was utilized including sterile gloves, hand hygiene, and alcohol prep. Utilizing a only gentle traction, the catheter was easily removed intact. Hemostasis was obtained with manual compression. A dressing was placed. The patient tolerated the procedure well without immediate post procedural complication. IMPRESSION: Successful removal of tunneled venous catheter. Read by:  Corrin Parker, PA-C Electronically Signed   By: Gilmer Mor D.O.   On: 09/09/2015 12:05  Ir Us Guide Vasc Access Right  09/15/2015  INDICATION: CHF EXAM: RIGHT JUGULAR TUNNELED PICC LINE PLACEMENT WITH ULTRASOUND AND FLUOROSCOPIC GUIDANCE MEDICATIONS: Patient was on antibiotics; The antibiotic was administered within an appropriate time interval prior to skin puncture. ANESTHESIA/SEDATION: Versed to mg IV; Fentanyl 50 mcg IV; Moderate Sedation Time:  20 The patient was continuously monitored during the procedure by the interventional radiology nurse under my direct supervision. FLUOROSCOPY TIME:  Fluoroscopy Time: 1 minutes 30 seconds (3 mGy). COMPLICATIONS: None immediate. PROCEDURE: The patient was advised of the possible risks andcomplications and agreed to undergo the procedure. The patient was then brought to the angiographic suite for the procedure. The right neck was prepped with chlorhexidine, draped in the usual sterile fashion using maximum barrier technique (cap and mask, sterile gown, sterile gloves, large sterile sheet, hand hygiene and cutaneous antiseptic). Local anesthesia was attained by infiltration with 1% lidocaine. Ultrasound demonstrated patency of the right jugular vein, and this was documented with an image. Under real-time ultrasound guidance, this vein was accessed with a 21 gauge micropuncture needle and image documentation was performed. The needle was exchanged over a  guidewire for a peel-away sheath through which a 20 cm 5 [place] single lumen power injectable tunnel PICC was advanced, and positioned with its tip at the lower SVC/right atrial junction. The cuff was positioned in the subcutaneous tract. Fluoroscopy during the procedure and fluoro spot radiograph confirms appropriate catheter position. The catheter was flushed, secured to the skin with Prolene sutures, and covered with a sterile dressing. FINDINGS: Tip of the PICC is at the cavoatrial junction. IMPRESSION: Successful placement of a tunneled right jugular PICC with sonographic and fluoroscopic guidance. The catheter is ready for use. Electronically Signed   By: Jolaine Click M.D.   On: 09/15/2015 15:26    Microbiology: Recent Results (from the past 240 hour(s))  Blood culture (routine x 2)     Status: Abnormal   Collection Time: 09/08/15 11:55 PM  Result Value Ref Range Status   Specimen Description BLOOD LEFT ANTECUBITAL  Final   Special Requests BOTTLES DRAWN AEROBIC AND ANAEROBIC 5CC  Final   Culture  Setup Time   Final    GRAM NEGATIVE RODS IN BOTH AEROBIC AND ANAEROBIC BOTTLES CRITICAL RESULT CALLED TO, READ BACK BY AND VERIFIED WITH: Vernard Gambles, PHARMD  0617/17 MKELLY    Culture ENTEROBACTER CLOACAE (A)  Final   Report Status 09/12/2015 FINAL  Final   Organism ID, Bacteria ENTEROBACTER CLOACAE  Final      Susceptibility   Enterobacter cloacae - MIC*    CEFAZOLIN >=64 RESISTANT Resistant     CEFEPIME <=1 SENSITIVE Sensitive     CEFTAZIDIME >=64 RESISTANT Resistant     CEFTRIAXONE >=64 RESISTANT Resistant     CIPROFLOXACIN <=0.25 SENSITIVE Sensitive     GENTAMICIN <=1 SENSITIVE Sensitive     IMIPENEM <=0.25 SENSITIVE Sensitive     TRIMETH/SULFA <=20 SENSITIVE Sensitive     PIP/TAZO >=128 RESISTANT Resistant     * ENTEROBACTER CLOACAE  Blood Culture ID Panel (Reflexed)     Status: Abnormal   Collection Time: 09/08/15 11:55 PM  Result Value Ref Range Status   Enterococcus  species NOT DETECTED NOT DETECTED Final   Vancomycin resistance NOT DETECTED NOT DETECTED Final   Listeria monocytogenes NOT DETECTED NOT DETECTED Final   Staphylococcus species NOT DETECTED NOT DETECTED Final   Staphylococcus aureus NOT DEK[place]TECTED NOT DETECTED Final   Methicillin resistance NOT DETECTED NOT DETECTED Final  Streptococcus species NOT DETECTED NOT DETECTED Final   Streptococcus agalactiae NOT DETECTED NOT DETECTED Final   Streptococcus pneumoniae NOT DETECTED NOT DETECTED Final   Streptococcus pyogenes NOT DETECTED NOT DETECTED Final   Acinetobacter baumannii NOT DETECTED NOT DETECTED Final   Enterobacteriaceae species DETECTED (A) NOT DETECTED Final    Comment: CRITICAL RESULT CALLED TO, READ BACK BY AND VERIFIED WITH: VERONDA BRYK, PHARMD @0449  09/10/15 MKELLY    Enterobacter cloacae complex DETECTED (A) NOT DETECTED Final    Comment: CRITICAL RESULT CALLED TO, READ BACK BY AND VERIFIED WITH: VERONDA BRYK,PHARMD @0449  09/10/15 MKELLY    Escherichia coli NOT DETECTED NOT DETECTED Final   Klebsiella oxytoca NOT DETECTED NOT DETECTED Final   Klebsiella pneumoniae NOT DETECTED NOT DETECTED Final   Proteus species NOT DETECTED NOT DETECTED Final   Serratia marcescens NOT DETECTED NOT DETECTED Final   Carbapenem resistance NOT DETECTED NOT DETECTED Final   Haemophilus influenzae NOT DETECTED NOT DETECTED Final   Neisseria meningitidis NOT DETECTED NOT DETECTED Final   Pseudomonas aeruginosa NOT DETECTED NOT DETECTED Final   Candida albicans NOT DETECTED NOT DETECTED Final   Candida glabrata NOT DETECTED NOT DETECTED Final   Candida krusei NOT DETECTED NOT DETECTED Final   Candida parapsilosis NOT DETECTED NOT DETECTED Final   Candida tropicalis NOT DETECTED NOT DETECTED Final  Blood culture (routine x 2)     Status: Abnormal   Collection Time: 09/09/15 12:05 AM  Result Value Ref Range Status   Specimen Description BLOOD RIGHT FOREARM  Final   Special Requests BOTTLES  DRAWN AEROBIC AND ANAEROBIC 5CC  Final   Culture  Setup Time   Final    GRAM NEGATIVE RODS ANAEROBIC BOTTLE ONLY CRITICAL RESULT CALLED TO, READ BACK BY AND VERIFIED WITH: J. FRENS PHARMD AT 0815 09/13/15 BY D. VANHOOK    Culture (A)  Final    ENTEROBACTER CLOACAE SUSCEPTIBILITIES PERFORMED ON PREVIOUS CULTURE WITHIN THE LAST 5 DAYS.    Report Status 09/15/2015 FINAL  Final  Culture, blood (Routine X 2) w Reflex to ID Panel     Status: None   Collection Time: 09/10/15  3:08 PM  Result Value Ref Range Status   Specimen Description BLOOD LEFT ANTECUBITAL  Final   Special Requests BOTTLES DRAWN AEROBIC AND ANAEROBIC 5CC  Final   Culture NO GROWTH 5 DAYS  Final   Report Status 09/15/2015 FINAL  Final  Culture, blood (Routine X 2) w Reflex to ID Panel     Status: None   Collection Time: 09/10/15  3:14 PM  Result Value Ref Range Status   Specimen Description BLOOD BLOOD LEFT FOREARM  Final   Special Requests BOTTLES DRAWN AEROBIC ONLY 5CC  Final   Culture NO GROWTH 5 DAYS  Final   Report Status 09/15/2015 FINAL  Final     Labs: Basic Metabolic Panel:  Recent Labs Lab 09/10/15 0904 09/11/15 0214 09/12/15 0408 09/13/15 0344 09/14/15 0401 09/15/15 0305 09/16/15 0512  NA  --  138 136 137 137 137 138  K  --  3.9 3.9 4.5 4.2 4.5 4.1  CL  --  107 105 107 110 108 106  CO2  --  25 24 24  21* 24 26  GLUCOSE  --  87 92 94 86 100* 104*  BUN  --  11 9 10 11 16 19   CREATININE  --  0.80 0.85 0.88 0.79 0.92 0.99  CALCIUM  --  9.0 9.0 9.4 9.3 9.4 9.3  MG 1.8 1.8  --   --   --   --  1.9   Liver Function Tests: No results for input(s): AST, ALT, ALKPHOS, BILITOT, PROT, ALBUMIN in the last 168 hours. No results for input(s): LIPASE, AMYLASE in the last 168 hours. No results for input(s): AMMONIA in the last 168 hours. CBC:  Recent Labs Lab 09/09/15 1630 09/12/15 0408 09/13/15 0344 09/15/15 0305  WBC  --  8.9 7.9 6.7  NEUTROABS 6.5  --   --   --   HGB  --  10.3* 10.2* 9.5*  HCT  --   33.0* 33.5* 30.4*  MCV  --  84.6 86.3 82.8  PLT  --  251 242 278   Cardiac Enzymes: No results for input(s): CKTOTAL, CKMB, CKMBINDEX, TROPONINI in the last 168 hours. BNP: BNP (last 3 results)  Recent Labs  02/03/15 2310 03/22/15 2015 06/05/15 0330  BNP 4003.4* >4500.0* 3619.4*    ProBNP (last 3 results) No results for input(s): PROBNP in the last 8760 hours.  CBG: No results for input(s): GLUCAP in the last 168 hours.

## 2015-09-16 NOTE — Care Management Important Message (Signed)
Important Message  Patient Details  Name: Katrina Peck MRN: [id number] Date of Birth: 06/17/1945   Medicare Important Message Given:  Yes    Bernadette Hoit 09/16/2015, 9:46 AM

## 2015-09-16 NOTE — Progress Notes (Signed)
Pt refused to have Milrinone drip connected to her new right chest tunneled PICC during this shift. Pt elected to continue with peripheral IV until discharge. Will continue to monitor.

## 2015-09-16 NOTE — Progress Notes (Signed)
Magnesium level was 1.8 on 6/18 and pt has been diuresing ever since. MD paged. Orders received to add Magnesium level to AM labs before projected discharge. Will continue to monitor.

## 2015-09-16 NOTE — Progress Notes (Signed)
Patient is active with [facility] for home Milrinone. Jeri Modena RN with [facility] called for discharge home today; she will be in around 12 noon today to see the patient. Abelino Derrick [facility] [phone]

## 2015-09-17 DIAGNOSIS — I240 Acute coronary thrombosis not resulting in myocardial infarction: Secondary | ICD-10-CM | POA: Diagnosis not present

## 2015-09-17 DIAGNOSIS — I480 Paroxysmal atrial fibrillation: Secondary | ICD-10-CM | POA: Diagnosis not present

## 2015-09-17 DIAGNOSIS — Z5181 Encounter for therapeutic drug level monitoring: Secondary | ICD-10-CM | POA: Diagnosis not present

## 2015-09-17 DIAGNOSIS — I313 Pericardial effusion (noninflammatory): Secondary | ICD-10-CM | POA: Diagnosis not present

## 2015-09-17 DIAGNOSIS — Z452 Encounter for adjustment and management of vascular access device: Secondary | ICD-10-CM | POA: Diagnosis not present

## 2015-09-17 DIAGNOSIS — B952 Enterococcus as the cause of diseases classified elsewhere: Secondary | ICD-10-CM | POA: Diagnosis not present

## 2015-09-17 DIAGNOSIS — Z7901 Long term (current) use of anticoagulants: Secondary | ICD-10-CM | POA: Diagnosis not present

## 2015-09-17 DIAGNOSIS — R7881 Bacteremia: Secondary | ICD-10-CM | POA: Diagnosis not present

## 2015-09-17 DIAGNOSIS — I429 Cardiomyopathy, unspecified: Secondary | ICD-10-CM | POA: Diagnosis not present

## 2015-09-17 DIAGNOSIS — D649 Anemia, unspecified: Secondary | ICD-10-CM | POA: Diagnosis not present

## 2015-09-17 DIAGNOSIS — I5032 Chronic diastolic (congestive) heart failure: Secondary | ICD-10-CM | POA: Diagnosis not present

## 2015-09-18 DIAGNOSIS — R7881 Bacteremia: Secondary | ICD-10-CM | POA: Diagnosis not present

## 2015-09-18 DIAGNOSIS — I240 Acute coronary thrombosis not resulting in myocardial infarction: Secondary | ICD-10-CM | POA: Diagnosis not present

## 2015-09-18 DIAGNOSIS — I313 Pericardial effusion (noninflammatory): Secondary | ICD-10-CM | POA: Diagnosis not present

## 2015-09-18 DIAGNOSIS — I429 Cardiomyopathy, unspecified: Secondary | ICD-10-CM | POA: Diagnosis not present

## 2015-09-18 DIAGNOSIS — B952 Enterococcus as the cause of diseases classified elsewhere: Secondary | ICD-10-CM | POA: Diagnosis not present

## 2015-09-18 DIAGNOSIS — I5032 Chronic diastolic (congestive) heart failure: Secondary | ICD-10-CM | POA: Diagnosis not present

## 2015-09-19 DIAGNOSIS — I5032 Chronic diastolic (congestive) heart failure: Secondary | ICD-10-CM | POA: Diagnosis not present

## 2015-09-19 DIAGNOSIS — I313 Pericardial effusion (noninflammatory): Secondary | ICD-10-CM | POA: Diagnosis not present

## 2015-09-19 DIAGNOSIS — I509 Heart failure, unspecified: Secondary | ICD-10-CM | POA: Diagnosis not present

## 2015-09-19 DIAGNOSIS — B952 Enterococcus as the cause of diseases classified elsewhere: Secondary | ICD-10-CM | POA: Diagnosis not present

## 2015-09-19 DIAGNOSIS — R7881 Bacteremia: Secondary | ICD-10-CM | POA: Diagnosis not present

## 2015-09-19 DIAGNOSIS — I429 Cardiomyopathy, unspecified: Secondary | ICD-10-CM | POA: Diagnosis not present

## 2015-09-19 DIAGNOSIS — I240 Acute coronary thrombosis not resulting in myocardial infarction: Secondary | ICD-10-CM | POA: Diagnosis not present

## 2015-09-20 ENCOUNTER — Encounter (HOSPITAL_COMMUNITY): Payer: Self-pay

## 2015-09-20 ENCOUNTER — Ambulatory Visit (HOSPITAL_COMMUNITY)
Admission: RE | Admit: 2015-09-20 | Discharge: 2015-09-20 | Disposition: A | Discharge status: Home / Self Care | Payer: Medicare Other | Source: Ambulatory Visit | Attending: Cardiology | Admitting: Cardiology

## 2015-09-20 VITALS — BP 100/76 | HR 86 | Ht 63.0 in | Wt 97.0 lb

## 2015-09-20 DIAGNOSIS — Z7901 Long term (current) use of anticoagulants: Secondary | ICD-10-CM | POA: Insufficient documentation

## 2015-09-20 DIAGNOSIS — Z9119 Patient's noncompliance with other medical treatment and regimen: Secondary | ICD-10-CM | POA: Insufficient documentation

## 2015-09-20 DIAGNOSIS — I447 Left bundle-branch block, unspecified: Secondary | ICD-10-CM | POA: Insufficient documentation

## 2015-09-20 DIAGNOSIS — I313 Pericardial effusion (noninflammatory): Secondary | ICD-10-CM | POA: Diagnosis not present

## 2015-09-20 DIAGNOSIS — Z86718 Personal history of other venous thrombosis and embolism: Secondary | ICD-10-CM | POA: Insufficient documentation

## 2015-09-20 DIAGNOSIS — R0602 Shortness of breath: Secondary | ICD-10-CM | POA: Insufficient documentation

## 2015-09-20 DIAGNOSIS — I429 Cardiomyopathy, unspecified: Secondary | ICD-10-CM | POA: Diagnosis not present

## 2015-09-20 DIAGNOSIS — I5022 Chronic systolic (congestive) heart failure: Secondary | ICD-10-CM

## 2015-09-20 DIAGNOSIS — Z9889 Other specified postprocedural states: Secondary | ICD-10-CM | POA: Diagnosis not present

## 2015-09-20 DIAGNOSIS — Z8619 Personal history of other infectious and parasitic diseases: Secondary | ICD-10-CM | POA: Insufficient documentation

## 2015-09-20 DIAGNOSIS — I480 Paroxysmal atrial fibrillation: Secondary | ICD-10-CM | POA: Diagnosis not present

## 2015-09-20 DIAGNOSIS — I110 Hypertensive heart disease with heart failure: Secondary | ICD-10-CM | POA: Diagnosis not present

## 2015-09-20 LAB — COMPREHENSIVE METABOLIC PANEL
ALBUMIN: 3.5 g/dL (ref 3.5–5.0)
ALK PHOS: 100 U/L (ref 38–126)
ALT: 25 U/L (ref 14–54)
ANION GAP: 7 (ref 5–15)
AST: 30 U/L (ref 15–41)
BUN: 18 mg/dL (ref 6–20)
CALCIUM: 9.5 mg/dL (ref 8.9–10.3)
CO2: 25 mmol/L (ref 22–32)
Chloride: 106 mmol/L (ref 101–111)
Creatinine, Ser: 0.99 mg/dL (ref 0.44–1.00)
GFR calc Af Amer: >60 mL/min (ref >60)
GFR calc non Af Amer: 57 mL/min — ABNORMAL LOW (ref >60)
GLUCOSE: 112 mg/dL — AB (ref 65–99)
Potassium: 4.3 mmol/L (ref 3.5–5.1)
SODIUM: 138 mmol/L (ref 135–145)
TOTAL PROTEIN: 7.8 g/dL (ref 6.5–8.1)
Total Bilirubin: 0.6 mg/dL (ref 0.3–1.2)

## 2015-09-20 LAB — TSH: TSH: 2.925 u[IU]/mL (ref 0.350–4.500)

## 2015-09-20 NOTE — Patient Instructions (Signed)
Routine lab work today. Will notify you of abnormal results, otherwise no news is good news!  Follow up 6 weeks with Dr. McLean.  Do the following things EVERYDAY: 1) Weigh yourself in the morning before breakfast. Write it down and keep it in a log. 2) Take your medicines as prescribed 3) Eat low salt foods-Limit salt (sodium) to 2000 mg per day.  4) Stay as active as you can everyday 5) Limit all fluids for the day to less than 2 liters 

## 2015-09-20 NOTE — Progress Notes (Signed)
Patient ID: Katrina Peck, female   DOB: December 01, 1945, 70 y.o.   MRN: [id number]    Advanced Heart Failure Clinic Note   PCP: Dr. Duanne Guess Cardiology: Dr. Shirlee Latch  70 y/o female with history of HTN who was admitted 07/20/14 with 3 months of exertional dyspnea, weight gain, cough, and orthopnea. Found to have EF 20% and a large pericardial effusion. Cath 5/16 no CAD. Hospital course complicated by atrial fibrillation w/ RVR, converted to NSR on amiodarone. Underwent pericardial window (transudate).    She had a repeat echo in 6/16 that showed persistently low LV EF at 15-20% with recurrence of moderate to large pericardial effusion without tamponade. This showed EF 10-15%, mild LV dilation, restrictive diastolic function, moderate to severely decreased RV function, and a moderate to large pericardial effusion similar to 6/16.   Admitted in 11/16 with marked volume overload. Diuresed with IV lasix. Discharged on milrinone 0.125 mcg. BB and ACE stopped. Discharge weight was 92 pounds.   She had problems with her PICC line (numbness/pain in right arm/hand) and wanted the PICC line out.  She did not want placement of tunneled catheter.  Therefore, PICC was removed and milrinone stopped.  She re-developed symptomatic low output CHF and was re-admitted in 12/16.  She then had tunneled catheter placed and was restarted on milrinone and was diuresed.  She was discharged home back on milrinone.  Echo in 12/16 showed EF 20% with LV thrombus.  She was put on coumadin.   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 Tunneled cath was removed and milrinone given through peripheral IV for several days. She completed a course of ceftriaxone x 6 weeks.  Tunneled cath replaced with repeat cultures negative for home inotropic support.    Admission 6/17 with tunneled catheter infection, cultures grew Enterobacter cloacae.   Catheter was replaced to allow ongoing home milrinone.    She returns for HF follow up.  She is doing pretty well.  Minimal exertional dyspnea, able to walk into office without problems.  Does not like milrinone pump at all.  No fever/chills.  She will be getting ertapenem until 7/1.  No orthopnea, PND, lightheadedness, palpitations.  No BRBPR or melena on warfarin.    Labs  5/16: K 4.6, creatinine 1.0 => 0.81, TSH normal, LFTs normal, TSH normal, BNP 1268 7/16: K 5.3 => 5.4, creatinine 1.0 => 1.02, HCT 39.7, BNP > 4500 => 1082, SPEP negative, urine IFE negative 8/16: K 5, creatinine 0.83 11/30/14: K 4.2 Creatinine 0.79  02/09/2015: K 4.5 Creatinine 1.11 12/16: digoxin < 0.2 1/17: K 3.8, creatinine 0.96, HCT 34.9 3/17: K 3.8, creatinine 0.77 6/17: K 4.1, creatinine 0.99, HCT 30.4  SH: Married, no smoking, no ETOH.   FH: No history of cardiomyopathy or sudden death.    ROS: All systems reviewed and negative except as per HPI.   PMH: 1. Chronic systolic CHF: Nonischemic cardiomyopathy.  LHC/RHC (4/16) with no significant coronary disease; mean RA 5, PA 28/11, mean PCWP 9, CI 3.39.  She is now on home milrinone.  - Echo (4/16): EF 20% with diffuse hypokinesis, moderate RV systolic dysfunction, large pericardial effusion.  No history of drug or ETOH abuse.  No family history of cardiomyopathy.    - Echo (6/16) with EF 15-20%, restrictive diastolic function, moderate MR, moderately decreased RV systolic function, PA systolic pressure 49 mmHg, moderate-large pericardial effusion without tamponade.   - Echo (  7/16) with EF 10-15%, mild LV dilation, diffuse hypokinesis, restrictive diastolic dysfunction, moderate MR, moderate to severely decreased RV systolic function, moderate TR, PA systolic pressure 51 mmHg, moderate to large pericardial effusion, no change from 6/16.   - Echo (8/16) with EF 10-15%, mild to moderately reduced RV systolic function, moderate pericardial effusion (slightly smaller  than prior), PASP 63 mmHg.   - Cardiac MRI (8/16) with LV EF 11%, RV EF 30%, mid-wall LGE involving the basal to mid septum (?myocarditis), moderate to large pericardial effusion with no evidence for tamponade.   - Echo (12/16): EF 20%, LV thrombus, mild MR, PASP 66 mmHg, moderate pericardial effusion.  - Echo (6/17) with EF 10%, small-moderate pericardial effusion.  2. Pericardial effusion: Large on 4/16 echo.  Patient had pericardial window for diagnostic and therapeutic purposes.  Cytology negative for malignancy.  6/16 echo with recurrence of moderate-large pericardial effusion without tamponade, similar repeat echo in 7/16, effusion slightly smaller in 8/16 by echo and MRI.  Moderate effusion on 12/16 echo.  Small to moderate pericardial effusion on 6/17 echo.  3. Atrial fibrillation: Atrial fibrillation with RVR, converted to NSR with amiodarone.   4. HTN 5. Noncompliance 6. LV thrombus: Noted on 12/16 echo.  7. MSSA bacteremia and discitis in 3/17, suspect tunneled catheter infection.  8. Enterobacter cloacae tunneled catheter infection  Current Outpatient Prescriptions  Medication Sig Dispense Refill  . amiodarone (PACERONE) 100 MG tablet Take 1 tablet (100 mg total) by mouth daily. 30 tablet 5  . ertapenem 1 g in sodium chloride 0.9 % 50 mL Inject 1 g into the vein daily. 1 Syringe 12  . magnesium oxide (MAG-OX) 400 MG tablet Take 200 mg by mouth daily as needed (leg cramping).    . milrinone (PRIMACOR) 20 MG/100 ML SOLN infusion Inject 11.25 mcg/min into the vein continuous. 11.25 mL 0  . oxyCODONE (OXY IR/ROXICODONE) 5 MG immediate release tablet Take 2 tablets (10 mg total) by mouth every 4 (four) hours as needed for moderate pain or severe pain. 30 tablet 0  . potassium chloride SA (K-DUR,KLOR-CON) 20 MEQ tablet Take 20 mEq by mouth daily.    . sodium chloride 0.9 % SOLN with milrinone 1 MG/ML SOLN 200 mcg/mL Inject 0.25 mcg/kg/min into the vein continuous. /140 ml Weight 45 kg     . spironolactone (ALDACTONE) 25 MG tablet Take 0.5 tablets (12.5 mg total) by mouth daily. 30 tablet 3  . torsemide (DEMADEX) 20 MG tablet Take 20 mg by mouth every other day.    . warfarin (COUMADIN) 2 MG tablet Take 3 tablets (6 mg total) by mouth daily. 80 tablet 2   No current facility-administered medications for this encounter.   No Known Allergies  Filed Vitals:   09/20/15 0921  BP: 100/76  Pulse: 86  Height:  (1.6 m)  Weight: 97 lb (43.999 kg)  SpO2: 100%   Wt Readings from Last 3 Encounters:  09/20/15 97 lb (43.999 kg)  09/16/15 94 lb 3.2 oz (42.729 kg)  09/06/15 100 lb 3.2 oz (45.45 kg)     PHYSICAL EXAM: General: Elderly, chronically ill appearing.  Thin.Husband present.  HEENT: normal Neck: supple. JVP 7 cm.  Carotids 2+ bilat; no bruits. No thyromegaly or nodule noted.  Cor: PMI nondisplaced. Regular rate & rhythm. Paradoxical S2 split Lungs: CTAB, normal effort.  Abdomen: soft, NT, mildly distended. No hepatosplenomegaly. No bruits or masses. +BS Extremities: no cyanosis, clubbing, rash.  No edema. Right upper chest tunneled catheter.  Neuro: alert & oriented x 3, cranial nerves grossly intact. moves all 4 extremities w/o difficulty. Affect pleasant.  ASSESSMENT & PLAN: 1. Chronic systolic HF: Due to nonischemic cardiomyopathy.  cMRI (8/16) showed LV EF 11%, RV EF 30%, and patchy mid-wall LGE in the basal-mid septum that may suggest prior myocarditis.  She is now back on milrinone 0.25 mcg/kg/min via tunneled catheter with no problems.  Echo (6/17) with EF 10%, LV thrombus not visualized.  NYHA class II symptoms on milrinone.  She is not volume overloaded on exam.   - Continue torsemide 20 mg every other day as she is doing.    - Continue milrinone 0.25 mcg/kg/min. - Continue spironolactone 12.5 daily. - No beta blocker with low output.  - She has advanced heart failure with high risk of morbidity/mortality over the next 6 months but she has great  difficulty following cardiology recommendations. I again mentioned LVAD to her in light of recurrent tunneled catheter infections and the limitations of home milrinone, but she remains uninterested in this.  - Previous EKG LBBB-like IVCD, QRS 138 msec.  Unlikely that she would derive enough benefit from CRT to get off milrinone.  It is unlikely that she would agree to device placement. 2. PAF: NSR today.   - Continue amio to 100 mg daily. Check LFTs and TSH today.  She was told to get regular eye exams while on amiodarone.  - Continue warfarin. No bleeding problems.  3. Pericardial effusion: S/p pericardial window. Transudate, cytology negative.  Last echo in 6/17 showed small to moderate effusion.  4. LV thrombus: She is on warfarin.  Thrombus not visualized on most recent echo.  5. Enterobacter cloacae tunneled catheter infection: Will be on ertepenem until 7/1.    [name] 09/20/2015

## 2015-09-22 ENCOUNTER — Ambulatory Visit (INDEPENDENT_AMBULATORY_CARE_PROVIDER_SITE_OTHER): Payer: Medicare Other | Admitting: Interventional Cardiology

## 2015-09-22 DIAGNOSIS — I240 Acute coronary thrombosis not resulting in myocardial infarction: Secondary | ICD-10-CM | POA: Diagnosis not present

## 2015-09-22 DIAGNOSIS — I213 ST elevation (STEMI) myocardial infarction of unspecified site: Secondary | ICD-10-CM

## 2015-09-22 DIAGNOSIS — R7881 Bacteremia: Secondary | ICD-10-CM | POA: Diagnosis not present

## 2015-09-22 DIAGNOSIS — I313 Pericardial effusion (noninflammatory): Secondary | ICD-10-CM | POA: Diagnosis not present

## 2015-09-22 DIAGNOSIS — I5032 Chronic diastolic (congestive) heart failure: Secondary | ICD-10-CM | POA: Diagnosis not present

## 2015-09-22 DIAGNOSIS — I429 Cardiomyopathy, unspecified: Secondary | ICD-10-CM | POA: Diagnosis not present

## 2015-09-22 DIAGNOSIS — I513 Intracardiac thrombosis, not elsewhere classified: Secondary | ICD-10-CM

## 2015-09-22 DIAGNOSIS — B952 Enterococcus as the cause of diseases classified elsewhere: Secondary | ICD-10-CM | POA: Diagnosis not present

## 2015-09-22 DIAGNOSIS — Z7901 Long term (current) use of anticoagulants: Secondary | ICD-10-CM

## 2015-09-22 DIAGNOSIS — I480 Paroxysmal atrial fibrillation: Secondary | ICD-10-CM

## 2015-09-22 LAB — POCT INR: INR: 1.2

## 2015-09-23 ENCOUNTER — Other Ambulatory Visit (HOSPITAL_COMMUNITY): Payer: Self-pay | Admitting: Cardiology

## 2015-09-24 DIAGNOSIS — R55 Syncope and collapse: Secondary | ICD-10-CM

## 2015-09-24 HISTORY — DX: Syncope and collapse: R55

## 2015-09-26 DIAGNOSIS — I429 Cardiomyopathy, unspecified: Secondary | ICD-10-CM | POA: Diagnosis not present

## 2015-09-26 DIAGNOSIS — I313 Pericardial effusion (noninflammatory): Secondary | ICD-10-CM | POA: Diagnosis not present

## 2015-09-26 DIAGNOSIS — B952 Enterococcus as the cause of diseases classified elsewhere: Secondary | ICD-10-CM | POA: Diagnosis not present

## 2015-09-26 DIAGNOSIS — I5032 Chronic diastolic (congestive) heart failure: Secondary | ICD-10-CM | POA: Diagnosis not present

## 2015-09-26 DIAGNOSIS — R7881 Bacteremia: Secondary | ICD-10-CM | POA: Diagnosis not present

## 2015-09-26 DIAGNOSIS — I240 Acute coronary thrombosis not resulting in myocardial infarction: Secondary | ICD-10-CM | POA: Diagnosis not present

## 2015-09-29 ENCOUNTER — Ambulatory Visit (INDEPENDENT_AMBULATORY_CARE_PROVIDER_SITE_OTHER): Payer: Medicare Other | Admitting: Cardiology

## 2015-09-29 DIAGNOSIS — I5032 Chronic diastolic (congestive) heart failure: Secondary | ICD-10-CM | POA: Diagnosis not present

## 2015-09-29 DIAGNOSIS — Z7901 Long term (current) use of anticoagulants: Secondary | ICD-10-CM

## 2015-09-29 DIAGNOSIS — I513 Intracardiac thrombosis, not elsewhere classified: Secondary | ICD-10-CM

## 2015-09-29 DIAGNOSIS — I429 Cardiomyopathy, unspecified: Secondary | ICD-10-CM | POA: Diagnosis not present

## 2015-09-29 DIAGNOSIS — I240 Acute coronary thrombosis not resulting in myocardial infarction: Secondary | ICD-10-CM | POA: Diagnosis not present

## 2015-09-29 DIAGNOSIS — R7881 Bacteremia: Secondary | ICD-10-CM | POA: Diagnosis not present

## 2015-09-29 DIAGNOSIS — I480 Paroxysmal atrial fibrillation: Secondary | ICD-10-CM

## 2015-09-29 DIAGNOSIS — B952 Enterococcus as the cause of diseases classified elsewhere: Secondary | ICD-10-CM | POA: Diagnosis not present

## 2015-09-29 DIAGNOSIS — I213 ST elevation (STEMI) myocardial infarction of unspecified site: Secondary | ICD-10-CM

## 2015-09-29 DIAGNOSIS — I313 Pericardial effusion (noninflammatory): Secondary | ICD-10-CM | POA: Diagnosis not present

## 2015-09-29 LAB — POCT INR: INR: 1.7

## 2015-09-30 ENCOUNTER — Other Ambulatory Visit: Payer: Self-pay

## 2015-09-30 NOTE — Patient Outreach (Signed)
[facility]) Care Management  09/30/2015  Katrina Peck 08-Mar-1946 [id number]   REFERRAL SOURCE:  EMMI HEART FAILURE REFERRAL REASON:  New/ worsening problem  Telephone call to patient regarding EMMI heart failure referral. Unable to reach patient. HIPAA compliant voice message left with call back phone number.   PLAN; RNCM will attempt 2nd telephone call within 1 week.  George Ina RN,BSN,CCM [facility] Telephonic  [phone]

## 2015-10-03 ENCOUNTER — Other Ambulatory Visit: Payer: Self-pay

## 2015-10-03 DIAGNOSIS — I240 Acute coronary thrombosis not resulting in myocardial infarction: Secondary | ICD-10-CM | POA: Diagnosis not present

## 2015-10-03 DIAGNOSIS — R7881 Bacteremia: Secondary | ICD-10-CM | POA: Diagnosis not present

## 2015-10-03 DIAGNOSIS — I429 Cardiomyopathy, unspecified: Secondary | ICD-10-CM | POA: Diagnosis not present

## 2015-10-03 DIAGNOSIS — I313 Pericardial effusion (noninflammatory): Secondary | ICD-10-CM | POA: Diagnosis not present

## 2015-10-03 DIAGNOSIS — Z5181 Encounter for therapeutic drug level monitoring: Secondary | ICD-10-CM | POA: Diagnosis not present

## 2015-10-03 DIAGNOSIS — I5032 Chronic diastolic (congestive) heart failure: Secondary | ICD-10-CM | POA: Diagnosis not present

## 2015-10-03 DIAGNOSIS — B952 Enterococcus as the cause of diseases classified elsewhere: Secondary | ICD-10-CM | POA: Diagnosis not present

## 2015-10-03 NOTE — Patient Outreach (Signed)
[facility]) Care Management  10/03/2015  Katrina Peck 10-Jul-1945 [id number]   REFERRAL DATE: 09/30/15 REFERRAL SOURCE: EMMI heart failure program REFERRAL REASON:  New/ worsening problems.  SUBJECTIVE:  Telephone call to patient regarding EMMI heart failure referral. HIPAA verified with patient. Discussed RNCM nurse follow up for EMMI heart failure program.  Patient verbalized agreement to receive follow up calls with RNCM.  Patient state she is feeling "pretty good."  Patient denies any problems today. Patient states her blood pressure was elevated over the weekend. Patient states her blood pressure on Sunday, 10/02/15 was 160/110.  Patient states she notified the nurse. Patient states she has been exercising everyday and she has noted that if she takes her blood pressure after exercises it is higher. Patient states she is going to start taking her vital signs before she exercises. Patient states she has all of her medications and is taking her medications as prescribed.  Patient states the nurse from [facility] sees her 2 times per week due to the Milrinone drip.  Patient states it is a little tender at the catheter site. Patient states he nurse is aware and has been monitoring it. Patient reports her physical therapy has ended with home health. Patient states she has progressed very well with her walking. Patient denies any shortness of breath. Patient states the shortness of breath has improved considerably. Patient states if she experiences any shortness of breath she stops to rest and this has helped. Patient states she saw her cardiologist, Dr. Sherlie Ban approximately 2-3 weeks ago. Patient states she has a follow up visit with Dr. Sherlie Ban in August 2017. Patient states she is unsure of  Her last visit with her primary MD. Patient states she knows it was within the past 3 months. Patient states she is trying not to listen to negative people when it comes to her health  condition. Patient states she has good support from her husband and family. RNCM advised patient to continue to take her medications as prescribed. RNCM advised patient to contact her doctor for unusual symptoms.  RNCM reviewed heart failure symptoms with patient. Patient verbalized understanding of heart failure symptoms and when to contact her doctor.  RNCM advised patient to continue to weigh and take vital signs daily and record. RNCM discussed with patient signs/symptoms of infection and to report any symptoms /concerns with Milrinone drip site.  Patient verbally agreed to telephone outreach calls with [facility] at this time. Patient verbally agreed to next telephone outreach call from [name].   ASSESSMENT: EMMI stroke transition program.  [facility] providing nursing services due to Milrinone drip.  PLAN: RNCM will follow up with patient within 2 weeks.  George Ina RN,BSN,CCM [facility] Telephonic  [phone]

## 2015-10-06 ENCOUNTER — Ambulatory Visit (INDEPENDENT_AMBULATORY_CARE_PROVIDER_SITE_OTHER): Payer: Medicare Other | Admitting: Internal Medicine

## 2015-10-06 ENCOUNTER — Other Ambulatory Visit (HOSPITAL_COMMUNITY): Payer: Self-pay | Admitting: Cardiology

## 2015-10-06 DIAGNOSIS — B952 Enterococcus as the cause of diseases classified elsewhere: Secondary | ICD-10-CM | POA: Diagnosis not present

## 2015-10-06 DIAGNOSIS — I429 Cardiomyopathy, unspecified: Secondary | ICD-10-CM | POA: Diagnosis not present

## 2015-10-06 DIAGNOSIS — I480 Paroxysmal atrial fibrillation: Secondary | ICD-10-CM

## 2015-10-06 DIAGNOSIS — I313 Pericardial effusion (noninflammatory): Secondary | ICD-10-CM | POA: Diagnosis not present

## 2015-10-06 DIAGNOSIS — I5032 Chronic diastolic (congestive) heart failure: Secondary | ICD-10-CM | POA: Diagnosis not present

## 2015-10-06 DIAGNOSIS — I513 Intracardiac thrombosis, not elsewhere classified: Secondary | ICD-10-CM

## 2015-10-06 DIAGNOSIS — Z7901 Long term (current) use of anticoagulants: Secondary | ICD-10-CM

## 2015-10-06 DIAGNOSIS — I240 Acute coronary thrombosis not resulting in myocardial infarction: Secondary | ICD-10-CM | POA: Diagnosis not present

## 2015-10-06 DIAGNOSIS — I213 ST elevation (STEMI) myocardial infarction of unspecified site: Secondary | ICD-10-CM

## 2015-10-06 DIAGNOSIS — R7881 Bacteremia: Secondary | ICD-10-CM | POA: Diagnosis not present

## 2015-10-06 LAB — POCT INR: INR: 1.9

## 2015-10-10 ENCOUNTER — Other Ambulatory Visit: Payer: Self-pay

## 2015-10-10 ENCOUNTER — Inpatient Hospital Stay (HOSPITAL_COMMUNITY)
Admission: EM | Admit: 2015-10-10 | Discharge: 2015-10-12 | DRG: 312 | Disposition: A | Discharge status: Home Health | Payer: Medicare Other | Attending: Internal Medicine | Admitting: Internal Medicine

## 2015-10-10 ENCOUNTER — Emergency Department (HOSPITAL_COMMUNITY): Payer: Medicare Other

## 2015-10-10 ENCOUNTER — Encounter (HOSPITAL_COMMUNITY): Payer: Self-pay | Admitting: *Deleted

## 2015-10-10 DIAGNOSIS — Z8249 Family history of ischemic heart disease and other diseases of the circulatory system: Secondary | ICD-10-CM

## 2015-10-10 DIAGNOSIS — I480 Paroxysmal atrial fibrillation: Secondary | ICD-10-CM | POA: Diagnosis not present

## 2015-10-10 DIAGNOSIS — Z9114 Patient's other noncompliance with medication regimen: Secondary | ICD-10-CM

## 2015-10-10 DIAGNOSIS — Z79899 Other long term (current) drug therapy: Secondary | ICD-10-CM

## 2015-10-10 DIAGNOSIS — R42 Dizziness and giddiness: Secondary | ICD-10-CM | POA: Diagnosis not present

## 2015-10-10 DIAGNOSIS — R55 Syncope and collapse: Principal | ICD-10-CM | POA: Diagnosis present

## 2015-10-10 DIAGNOSIS — Z681 Body mass index (BMI) 19 or less, adult: Secondary | ICD-10-CM

## 2015-10-10 DIAGNOSIS — Z7901 Long term (current) use of anticoagulants: Secondary | ICD-10-CM

## 2015-10-10 DIAGNOSIS — Z91148 Patient's other noncompliance with medication regimen for other reason: Secondary | ICD-10-CM

## 2015-10-10 DIAGNOSIS — E43 Unspecified severe protein-calorie malnutrition: Secondary | ICD-10-CM | POA: Diagnosis present

## 2015-10-10 DIAGNOSIS — I110 Hypertensive heart disease with heart failure: Secondary | ICD-10-CM | POA: Diagnosis present

## 2015-10-10 DIAGNOSIS — Z9119 Patient's noncompliance with other medical treatment and regimen: Secondary | ICD-10-CM

## 2015-10-10 DIAGNOSIS — I5023 Acute on chronic systolic (congestive) heart failure: Secondary | ICD-10-CM | POA: Diagnosis present

## 2015-10-10 DIAGNOSIS — I429 Cardiomyopathy, unspecified: Secondary | ICD-10-CM | POA: Diagnosis present

## 2015-10-10 DIAGNOSIS — I5022 Chronic systolic (congestive) heart failure: Secondary | ICD-10-CM | POA: Diagnosis not present

## 2015-10-10 DIAGNOSIS — R531 Weakness: Secondary | ICD-10-CM | POA: Diagnosis not present

## 2015-10-10 DIAGNOSIS — I513 Intracardiac thrombosis, not elsewhere classified: Secondary | ICD-10-CM | POA: Diagnosis present

## 2015-10-10 DIAGNOSIS — I313 Pericardial effusion (noninflammatory): Secondary | ICD-10-CM | POA: Diagnosis present

## 2015-10-10 DIAGNOSIS — R404 Transient alteration of awareness: Secondary | ICD-10-CM | POA: Diagnosis not present

## 2015-10-10 DIAGNOSIS — I447 Left bundle-branch block, unspecified: Secondary | ICD-10-CM | POA: Diagnosis present

## 2015-10-10 HISTORY — PX: PERIPHERALLY INSERTED CENTRAL CATHETER INSERTION: SHX2221

## 2015-10-10 HISTORY — DX: Syncope and collapse: R55

## 2015-10-10 LAB — BASIC METABOLIC PANEL
Anion gap: 8 (ref 5–15)
BUN: 23 mg/dL — AB (ref 6–20)
CALCIUM: 9.8 mg/dL (ref 8.9–10.3)
CO2: 25 mmol/L (ref 22–32)
Chloride: 103 mmol/L (ref 101–111)
Creatinine, Ser: 1.02 mg/dL — ABNORMAL HIGH (ref 0.44–1.00)
GFR calc Af Amer: >60 mL/min (ref >60)
GFR, EST NON AFRICAN AMERICAN: 55 mL/min — AB (ref >60)
GLUCOSE: 93 mg/dL (ref 65–99)
Potassium: 4.4 mmol/L (ref 3.5–5.1)
Sodium: 136 mmol/L (ref 135–145)

## 2015-10-10 LAB — CBC
HCT: 39.5 % (ref 36.0–46.0)
Hemoglobin: 12.7 g/dL (ref 12.0–15.0)
MCH: 27 pg (ref 26.0–34.0)
MCHC: 32.2 g/dL (ref 30.0–36.0)
MCV: 83.9 fL (ref 78.0–100.0)
PLATELETS: 206 10*3/uL (ref 150–400)
RBC: 4.71 MIL/uL (ref 3.87–5.11)
RDW: 15.5 % (ref 11.5–15.5)
WBC: 8.9 10*3/uL (ref 4.0–10.5)

## 2015-10-10 LAB — I-STAT TROPONIN, ED: Troponin i, poc: 0.01 ng/mL (ref 0.00–0.08)

## 2015-10-10 LAB — PROTIME-INR
INR: 1.48 (ref 0.00–1.49)
Prothrombin Time: 18 seconds — ABNORMAL HIGH (ref 11.6–15.2)

## 2015-10-10 LAB — MAGNESIUM: Magnesium: 1.9 mg/dL (ref 1.7–2.4)

## 2015-10-10 LAB — PREALBUMIN: PREALBUMIN: 33.7 mg/dL (ref 18–38)

## 2015-10-10 LAB — TSH: TSH: 2.11 u[IU]/mL (ref 0.350–4.500)

## 2015-10-10 MED ORDER — ACETAMINOPHEN 650 MG RE SUPP: 650.0000 mg | Freq: Four times a day (QID) | RECTAL | Status: DC | PRN

## 2015-10-10 MED ORDER — POLYETHYLENE GLYCOL 3350 17 G PO PACK: 17.0000 g | PACK | Freq: Every day | ORAL | Status: DC | PRN

## 2015-10-10 MED ORDER — SODIUM CHLORIDE 0.9% FLUSH
3.0000 mL | Freq: Two times a day (BID) | INTRAVENOUS | Status: DC
Start: 2015-10-10 — End: 2015-10-12
  Administered 2015-10-12: 3 mL via INTRAVENOUS

## 2015-10-10 MED ORDER — MILRINONE LACTATE IN DEXTROSE 20-5 MG/100ML-% IV SOLN
0.2500 ug/kg/min | INTRAVENOUS | Status: DC
  Administered 2015-10-10 – 2015-10-11 (×2): 0.25 ug/kg/min via INTRAVENOUS
  Filled 2015-10-10 (×2): qty 100

## 2015-10-10 MED ORDER — SODIUM CHLORIDE 0.9 % IV SOLN: 250.0000 mL | INTRAVENOUS | Status: DC | PRN

## 2015-10-10 MED ORDER — ONDANSETRON HCL 4 MG/2ML IJ SOLN: 4.0000 mg | Freq: Four times a day (QID) | INTRAMUSCULAR | Status: DC | PRN

## 2015-10-10 MED ORDER — ACETAMINOPHEN 325 MG PO TABS: 650.0000 mg | ORAL_TABLET | Freq: Four times a day (QID) | ORAL | Status: DC | PRN

## 2015-10-10 MED ORDER — AMIODARONE HCL 100 MG PO TABS
100.0000 mg | ORAL_TABLET | Freq: Every day | ORAL | Status: DC
  Administered 2015-10-10 – 2015-10-11 (×2): 100 mg via ORAL
  Filled 2015-10-10 (×2): qty 1

## 2015-10-10 MED ORDER — SODIUM CHLORIDE 0.9 % IV SOLN: 0.2500 ug/kg/min | INTRAVENOUS | Status: DC

## 2015-10-10 MED ORDER — SODIUM CHLORIDE 0.9% FLUSH: 3.0000 mL | INTRAVENOUS | Status: DC | PRN

## 2015-10-10 MED ORDER — ONDANSETRON HCL 4 MG PO TABS: 4.0000 mg | ORAL_TABLET | Freq: Four times a day (QID) | ORAL | Status: DC | PRN

## 2015-10-10 MED ORDER — WARFARIN SODIUM 3 MG PO TABS
8.0000 mg | ORAL_TABLET | ORAL | Status: AC
  Administered 2015-10-10: 8 mg via ORAL
  Filled 2015-10-10: qty 1

## 2015-10-10 MED ORDER — WARFARIN - PHARMACIST DOSING INPATIENT
Freq: Every day | Status: DC
  Administered 2015-10-11: 18:00:00

## 2015-10-10 MED ORDER — SODIUM CHLORIDE 0.9% FLUSH
3.0000 mL | Freq: Two times a day (BID) | INTRAVENOUS | Status: DC
  Administered 2015-10-12: 3 mL via INTRAVENOUS

## 2015-10-10 NOTE — ED Notes (Signed)
Pt [facility]. Pt had a syncopal episode with [facility] when they came out to help pt up from an episode of dizziness. Pt states she was "feeling faint and lowered herself to her knees", her husband thought she fell and called [facility]. When [facility] attempted to help pt up she had a LOC for approximately 30 seconds. Pt arrives to ED without her milrinone. Pt states someone took her cassette out and laid it on her kitchen table. Pharmacy contacted to verify and order drip STAT. Dr. Rhunette Croft informed.

## 2015-10-10 NOTE — Progress Notes (Signed)
ANTICOAGULATION CONSULT NOTE  Pharmacy Consult for Warfarin Indication: h/o LV thrombus  No Known Allergies  Patient Measurements: Height: 5\' 2"  (157.5 cm) Weight: 97 lb (44 kg) IBW/kg (Calculated) : 50.1  Vital Signs: Temp: 97.9 F (36.6 C) (07/17 1422) Temp Source: Oral (07/17 1422) BP: 115/84 mmHg (07/17 1945) Pulse Rate: 86 (07/17 1945)  Labs:  Recent Labs  10/10/15 1526  HGB 12.7  HCT 39.5  PLT 206  LABPROT 18.0*  INR 1.48  CREATININE 1.02*    Estimated Creatinine Clearance: 36.2 mL/min (by C-G formula based on Cr of 1.02).   Medications:   (Not in a hospital admission) Scheduled:  . amiodarone  100 mg Oral Daily  . sodium chloride flush  3 mL Intravenous Q12H  . sodium chloride flush  3 mL Intravenous Q12H   Infusions:  . sodium chloride    . milrinone 0.25 mcg/kg/min (10/10/15 1459)    Assessment: 70yo female with history of NICM on milrinone, PAF and nonadherence presents with a syncopal episode. Pharmacy is consulted to dose wafarin for history of LV thrombus.  PTA Warfarin Dose: 8mg  Mon/Thur/Sat and 6mg  AODs with last dose 10/09/15  Goal of Therapy:  INR 2-3 Monitor platelets by anticoagulation protocol: Yes   Plan:  Warfarin 8mg  tonight x1 Daily INR/CBC Monitor s/sx of bleeding  Arlean Hopping. Newman Pies, PharmD, BCPS Clinical Pharmacist Pager [phone] 10/10/2015,8:08 PM

## 2015-10-10 NOTE — Patient Outreach (Signed)
[facility]) Care Management  10/10/2015  Katrina Peck 1945/11/26 [id number]   REFERRAL DATE: 09/30/15 REFERRAL SOURCE: EMMI heart failure program REFERRAL REASON: EMMI heart failure follow up call CONSENT: Self  SUBJECTIVE:  Telephone call to patient regarding EMMI heart failure follow up. HIPAA verified with patient.  Patient states she continues to do well. Patient states she has very little shortness of breath. Patient states her shortness of breath is not as intense as it was and she is thankful for that. Patient states she does have to carry around a pouch due to receiving the milrinone medication. Patient states she sometimes feels restricted due to this. Patient states she tries to do a little exercise every day.  Patient states it has been to hot to go outside to walk so she will do what she can in the house.  Patient states it makes her feel better when she is able to do some exercising. Patient states she is aware she should not be outside trying to exercise on very hot or very cold days. . Patient reports she did her vital sign checks this morning. Patient states her blood pressure today was 104/70, pulse was initially 90 but states she took it again and it was 93, and her weight was 96.8 lbs. Patient states she drinks boost on occasion for extra calories. Patient states she is on a 2L fluid restriction and low/no salt diet.  Patient states her nurse from home health will be coming on today regarding her Milrinone drip. Patient denies any signs/symptoms of infection at drip site.  RNCM reviewed with patient signs and symptoms of heart failure.  Advised patient to contact doctor for heart failure symptoms at onset. Advised patient to call 911 for worsening heart failure symptoms. RNCM advised patient to continue to take her medications as prescribed. RNCM advised patient to keep follow up doctor appointments.  RNCM discussed sending patient EMMI education material  related to heart failure. Patient verbally agreed to receive material. Patient verbally agreed to next telephone outreach from [facility].   ASSESSMENT:  Patient would benefit from continued telephonic follow up with nurse case manager for heart failure education and support.   PLAN:  RNCM will follow up with patient within 2 week.s RNCM will mail patient EMMI education material on:  Heart failure, how to be salt smart  And heart failure, when to call doctor or 911.   George Ina RN,BSN,CCM [facility] Telephonic  [phone]

## 2015-10-10 NOTE — H&P (Signed)
History and Physical    Katrina Peck:[id number] DOB: October 06, 1945 DOA: 10/10/2015  PCP: No primary care provider on file.   Patient coming from: Home  Chief Complaint: Syncope   HPI: Katrina Peck is a 70 y.o. female with medical history significant for nonischemic cardiomyopathy with EF 10% on chronic milrinone infusion, paroxysmal atrial fibrillation, and nonadherence to her treatment plan who presents the emergency department following a syncopal episode at home. Patient reports that she went to bed last night in her usual state of health and woke this morning with some mild lightheadedness and nausea. Symptoms continued to progress throughout the morning and he eventually she felt as though she would faint and remembers trying to lower herself to the ground. Her husband witnessed the episode, and reports that she appeared to fall to her knees and was poorly responsive for several seconds. Patient denies any associated chest pain, palpitations, diaphoresis, or dyspnea. There has been no headache, focal numbness or weakness, change in vision or hearing, or loss of coordination. She denies any recent change in her medications. She denies any recent illness, fevers, chills, or cough. There was no apparent injury suffered during the syncopal episode.  ED Course: Upon arrival to the ED, patient is found to be afebrile, saturating well on room air, and with vital signs stable. EKG demonstrates a sinus rhythm with incomplete LBBB and chest x-rays negative for acute cardiopulmonary disease. Head CT was obtained and negative for acute intracranial abnormality. BMP features a very mild elevation in BUN and creatinine. CBC is unremarkable and troponin is within the normal limits. Patient arrived to the emergency department without her milrinone and was started on milrinone infusion shortly after arrival. She remained hemodynamically stable in the ED with intraventricular conduction delay noted on telemetry  monitoring. She'll be observed on the telemetry unit for ongoing evaluation and management of syncopal episode, possibly cardiogenic.  Review of Systems:  All other systems reviewed and apart from HPI, are negative.  Past Medical History  Diagnosis Date  . Anemia   . Hypertension   . Blood transfusion without reported diagnosis   . Chronic systolic CHF (congestive heart failure) (HCC)     a. 06/2014 Echo: EF 20%.  Marland Kitchen NICM (nonischemic cardiomyopathy) (HCC)     a. 06/2014 Echo: EF 20%, diff HK, mild MR, mildly dil LA/RA, mildly reduced RV fxn;  b. 06/2014 Cath: nl cors.  . Pericardial effusion     a. 06/2014 Large effusion, no tamponade, s/p window for diag/therap - neg malig. b. 6/16 - recurrence w/o tamponade, subsequently folloed by echoes. Mod 12/16.  Marland Kitchen PAF (paroxysmal atrial fibrillation) (HCC)     a. with RVR, converted to NSR with amiodarone.  . Noncompliance   . LV (left ventricular) mural thrombus (HCC)     a. Noted on 12/16 echo.   . Bacteremia     a. MSSA bacteremia and discitis in 3/17, suspect tunneled catheter infection.   . Discitis     Past Surgical History  Procedure Laterality Date  . No past surgeries    . Left and right heart catheterization with coronary angiogram N/A 07/23/2014    Procedure: LEFT AND RIGHT HEART CATHETERIZATION WITH CORONARY ANGIOGRAM;  Surgeon: Laurey Morale, MD;  Location: [facility] CATH LAB;  Service: Cardiovascular;  Laterality: N/A;  . Subxyphoid pericardial window N/A 07/26/2014    Procedure: SUBXYPHOID PERICARDIAL WINDOW;  Surgeon: Delight Ovens, MD;  Location: [facility] OR;  Service: Thoracic;  Laterality: N/A;  .  Tee without cardioversion N/A 07/26/2014    Procedure: TRANSESOPHAGEAL ECHOCARDIOGRAM (TEE);  Surgeon: Delight Ovens, MD;  Location[facility] OR;  Service: Thoracic;  Laterality: N/A;  . Pleural effusion drainage N/A 07/26/2014    Procedure: DRAINAGE OF PERICARDIAL EFFUSION;  Surgeon: Delight Ovens, MD;  Location: [facility] OR;  Service: Thoracic;   Laterality: N/A;  . Cardiac catheterization N/A 02/07/2015    Procedure: Right Heart Cath;  Surgeon: Laurey Morale, MD;  Location: [facility] INVASIVE CV LAB;  Service: Cardiovascular;  Laterality: N/A;  . Tee without cardioversion N/A 06/06/2015    Procedure: TRANSESOPHAGEAL ECHOCARDIOGRAM (TEE);  Surgeon: Dolores Patty, MD;  Location: [facility] ENDOSCOPY;  Service: Cardiovascular;  Laterality: N/A;  . Radiology with anesthesia N/A 06/08/2015    Procedure: MRI CERVICAL SPINE WITH/WITHOUT LUMBAR WITH/WITHOUT THORACIC WITH/WITHOUT       (RADIOLOGY WITH ANESTHESIA);  Surgeon: Medication Radiologist, MD;  Location: [facility] OR;  Service: Radiology;  Laterality: N/A;     reports that she has never smoked. She has never used smokeless tobacco. She reports that she does not drink alcohol or use illicit drugs.  No Known Allergies  Family History  Problem Relation Age of Onset  . Hypertension Sister   . Diabetes Maternal Grandmother   . Cancer Mother     died young (pt was only in [employer] @ the time)  . Other Father     died in his 87's.     Prior to Admission medications   Medication Sig Start Date End Date Taking? Authorizing Provider  amiodarone (PACERONE) 100 MG tablet Take 1 tablet (100 mg total) by mouth daily. 11/30/14   Amy D Filbert Schilder, NP  magnesium oxide (MAG-OX) 400 MG tablet Take 200 mg by mouth daily as needed (leg cramping).    Historical Provider, MD  milrinone (PRIMACOR) 20 MG/100 ML SOLN infusion Inject 11.25 mcg/min into the vein continuous. 09/12/15   Alison Murray, MD  oxyCODONE (OXY IR/ROXICODONE) 5 MG immediate release tablet Take 2 tablets (10 mg total) by mouth every 4 (four) hours as needed for moderate pain or severe pain. Patient not taking: Reported on 10/10/2015 06/16/15   Tiffany L Reed, DO  potassium chloride SA (K-DUR,KLOR-CON) 20 MEQ tablet Take 20 mEq by mouth daily.    Historical Provider, MD  sodium chloride 0.9 % SOLN with milrinone 1 MG/ML SOLN 200 mcg/mL Inject 0.25  mcg/kg/min into the vein continuous. 140mg /140 ml Weight 45 kg    Historical Provider, MD  spironolactone (ALDACTONE) 25 MG tablet Take 0.5 tablets (12.5 mg total) by mouth daily. 09/06/15   Graciella Freer, PA-C  torsemide (DEMADEX) 20 MG tablet Take 20 mg by mouth daily as needed (for fluid retention).     Historical Provider, MD  warfarin (COUMADIN) 2 MG tablet Take 3 tablets (6 mg total) by mouth daily. 09/16/15   Alison Murray, MD    Physical Exam: Filed Vitals:   10/10/15 1645 10/10/15 1715 10/10/15 1800 10/10/15 1815  BP: 106/91 114/68 112/76 119/81  Pulse: 88 91 90 88  Temp:      TempSrc:      Resp: 20 18 19 19   Height:      Weight:      SpO2: 100% 100% 100% 100%      Constitutional: NAD, calm, comfortable, cachectic Eyes: PERTLA, lids and conjunctivae normal ENMT: Mucous membranes are moist. Posterior pharynx clear of any exudate or lesions.   Neck: normal, supple, no masses, no thyromegaly Respiratory: clear  to auscultation bilaterally, no wheezing, no crackles. Normal respiratory effort.    Cardiovascular: S1 & S2 heard, regular rate and rhythm, no significant murmurs / rubs / gallops. No extremity edema. No significant JVD. Abdomen: No distension, no tenderness, no masses palpated. Bowel sounds normal.  Musculoskeletal: no clubbing / cyanosis. No joint deformity upper and lower extremities. Normal muscle tone.  Skin: no significant rashes, lesions, ulcers. Warm, dry, well-perfused. Neurologic: CN 2-12 grossly intact. Sensation intact, DTR normal. Strength 5/5 in all 4 limbs.  Psychiatric: Normal judgment and insight. Alert and oriented x 3. Normal mood and affect.     Labs on Admission: I have personally reviewed following labs and imaging studies  CBC:  Recent Labs Lab 10/10/15 1526  WBC 8.9  HGB 12.7  HCT 39.5  MCV 83.9  PLT 206   Basic Metabolic Panel:  Recent Labs Lab 10/10/15 1526  NA 136  K 4.4  CL 103  CO2 25  GLUCOSE 93  BUN 23*    CREATININE 1.02*  CALCIUM 9.8   GFR: Estimated Creatinine Clearance: 36.2 mL/min (by C-G formula based on Cr of 1.02). Liver Function Tests: No results for input(s): AST, ALT, ALKPHOS, BILITOT, PROT, ALBUMIN in the last 168 hours. No results for input(s): LIPASE, AMYLASE in the last 168 hours. No results for input(s): AMMONIA in the last 168 hours. Coagulation Profile:  Recent Labs Lab 10/06/15 10/10/15 1526  INR 1.9 1.48   Cardiac Enzymes: No results for input(s): CKTOTAL, CKMB, CKMBINDEX, TROPONINI in the last 168 hours. BNP (last 3 results) No results for input(s): PROBNP in the last 8760 hours. HbA1C: No results for input(s): HGBA1C in the last 72 hours. CBG: No results for input(s): GLUCAP in the last 168 hours. Lipid Profile: No results for input(s): CHOL, HDL, LDLCALC, TRIG, CHOLHDL, LDLDIRECT in the last 72 hours. Thyroid Function Tests: No results for input(s): TSH, T4TOTAL, FREET4, T3FREE, THYROIDAB in the last 72 hours. Anemia Panel: No results for input(s): VITAMINB12, FOLATE, FERRITIN, TIBC, IRON, RETICCTPCT in the last 72 hours. Urine analysis:    Component Value Date/Time   COLORURINE AMBER* 09/09/2015 0222   APPEARANCEUR CLOUDY* 09/09/2015 0222   LABSPEC 1.029 09/09/2015 0222   PHURINE 6.0 09/09/2015 0222   GLUCOSEU NEGATIVE 09/09/2015 0222   HGBUR LARGE* 09/09/2015 0222   BILIRUBINUR NEGATIVE 09/09/2015 0222   KETONESUR 40* 09/09/2015 0222   PROTEINUR 100* 09/09/2015 0222   UROBILINOGEN 1.0 07/25/2014 2239   NITRITE NEGATIVE 09/09/2015 0222   LEUKOCYTESUR SMALL* 09/09/2015 0222   Sepsis Labs: @LABRCNTIP (procalcitonin:4,lacticidven:4) )No results found for this or any previous visit (from the past 240 hour(s)).   Radiological Exams on Admission: Dg Chest 2 View  10/10/2015  CLINICAL DATA:  Syncopal episode and dizziness today. EXAM: CHEST  2 VIEW COMPARISON:  09/09/2015 FINDINGS: Prominent cardiopericardial silhouette consistent with persistent  pericardial effusion and cardiac enlargement. The right IJ catheter is stable. The lungs are clear. No pulmonary edema or pleural effusions. The bony thorax is intact. T10 compression fracture stable since March 2017 IMPRESSION: Stable pericardial effusion. No acute pulmonary findings. T10 compression fracture, stable. Electronically Signed   By: Rudie Meyer M.D.   On: 10/10/2015 16:12   Ct Head Wo Contrast  10/10/2015  CLINICAL DATA:  Syncope today.  Possible fall.  Initial encounter. EXAM: CT HEAD WITHOUT CONTRAST TECHNIQUE: Contiguous axial images were obtained from the base of the skull through the vertex without intravenous contrast. COMPARISON:  Head CT scan 04/17/2005. FINDINGS: The brain appears normal without  hemorrhage, infarct, mass lesion, mass effect, midline shift or abnormal extra-axial fluid collection. No hydrocephalus or pneumocephalus. The calvarium is intact. Imaged paranasal sinuses and mastoid air cells are clear. Carotid atherosclerosis is noted. IMPRESSION: No acute abnormality. Atherosclerosis. Electronically Signed   By: Drusilla Kanner M.D.   On: 10/10/2015 16:38    EKG: Independently reviewed. Sinus rhythm, incomplete LBBB  Assessment/Plan  1. Syncope  - Occurred just PTA; preceded by lightheadedness and nausea  - Orthostatic vitals negative in ED  - With EF 10% and non-adherence to treatment, primary concern is for arrhythmia  - Monitor on continuous telemetry overnight; TTE deferred for now as performed 1 month ago  - Monitor lytes, replete prn  - Resume her amiodarone  - Consider EPS consultation   2. Chronic systolic CHF  - Appears slightly dry to euvolemic on admission - TTE (09/11/15) with EF 10%, diffuse hypokinesis, mild TR, mild MR  - Cardiac cath in November 2016 with clean coronaries  - Prescribed torsemide and spironolactone; held on admission as she appears dry  - SLIV, daily wts, strict I/O's, fluid-restrict diet    3. Atrial fibrillation,  paroxysmal  - In sinus rhythm on admission  - CHADS-VASc at least 2 (gender, CHF), but also has LV thrombus  - Resume anticoagulation with warfarin; INR 1.48 on admission, likely secondary to inconsistent use   4. Non-adherence to treatment plan  - Pt admits to inconsistent use of her medications, but is unable to verbalize a specific barrier or concern  - Encouraged more consistent use of medications as prescribed  5. Protein-calorie malnutrition  - BMI <18 and bitemporal wasting noted  - Likely secondary to severe chronic CHF  - Check prealbumin - Dietary consultation requested for recs     DVT prophylaxis: Warfarin  Code Status: Full  Family Communication: Discussed with patient  Disposition Plan: Observe on telemetry  Consults called: None  Admission status: Observation    Briscoe Deutscher, MD [facility] Pager [phone]  If 7PM-7AM, please contact night-coverage [email or web address] Password [facility]  10/10/2015, 7:47 PM

## 2015-10-10 NOTE — ED Provider Notes (Signed)
CSN: [id number]     Arrival date & time 10/10/15  1421 History   First MD Initiated Contact with Patient 10/10/15 1440     Chief Complaint  Patient presents with  . Loss of Consciousness   HPI  Katrina Peck is a 70 y.o. female PMH significant for HTN, CHF (EF 20% 06/2014), pericardial effusion (06/2014; stable), PAF presenting status post syncopal episode. She states that she was feeling faint and lowered herself to her knees, but her husband told her that she fell. No head injury. She does endorse loss of consciousness for approximately 30 seconds and a frontal headache that worsens with talking. No fevers, chills, SOB, CP, abdominal pain, N/V, loss of bladder/bowel control.   Past Medical History  Diagnosis Date  . Anemia   . Hypertension   . Blood transfusion without reported diagnosis   . Chronic systolic CHF (congestive heart failure) (HCC)     a. 06/2014 Echo: EF 20%.  Marland Kitchen NICM (nonischemic cardiomyopathy) (HCC)     a. 06/2014 Echo: EF 20%, diff HK, mild MR, mildly dil LA/RA, mildly reduced RV fxn;  b. 06/2014 Cath: nl cors.  . Pericardial effusion     a. 06/2014 Large effusion, no tamponade, s/p window for diag/therap - neg malig. b. 6/16 - recurrence w/o tamponade, subsequently folloed by echoes. Mod 12/16.  Marland Kitchen PAF (paroxysmal atrial fibrillation) (HCC)     a. with RVR, converted to NSR with amiodarone.  . Noncompliance   . LV (left ventricular) mural thrombus (HCC)     a. Noted on 12/16 echo.   . Bacteremia     a. MSSA bacteremia and discitis in 3/17, suspect tunneled catheter infection.   . Discitis    Past Surgical History  Procedure Laterality Date  . No past surgeries    . Left and right heart catheterization with coronary angiogram N/A 07/23/2014    Procedure: LEFT AND RIGHT HEART CATHETERIZATION WITH CORONARY ANGIOGRAM;  Surgeon: Laurey Morale, MD;  Location: [facility] CATH LAB;  Service: Cardiovascular;  Laterality: N/A;  . Subxyphoid pericardial window N/A 07/26/2014   Procedure: SUBXYPHOID PERICARDIAL WINDOW;  Surgeon: Delight Ovens, MD;  Location: [facility] OR;  Service: Thoracic;  Laterality: N/A;  . Tee without cardioversion N/A 07/26/2014    Procedure: TRANSESOPHAGEAL ECHOCARDIOGRAM (TEE);  Surgeon: Delight Ovens, MD;  Location: [facility] OR;  Service: Thoracic;  Laterality: N/A;  . Pleural effusion drainage N/A 07/26/2014    Procedure: DRAINAGE OF PERICARDIAL EFFUSION;  Surgeon: Delight Ovens, MD;  Location: [facility] OR;  Service: Thoracic;  Laterality: N/A;  . Cardiac catheterization N/A 02/07/2015    Procedure: Right Heart Cath;  Surgeon: Laurey Morale, MD;  Location: [facility] INVASIVE CV LAB;  Service: Cardiovascular;  Laterality: N/A;  . Tee without cardioversion N/A 06/06/2015    Procedure: TRANSESOPHAGEAL ECHOCARDIOGRAM (TEE);  Surgeon: Dolores Patty, MD;  Location: [facility] ENDOSCOPY;  Service: Cardiovascular;  Laterality: N/A;  . Radiology with anesthesia N/A 06/08/2015    Procedure: MRI CERVICAL SPINE WITH/WITHOUT LUMBAR WITH/WITHOUT THORACIC WITH/WITHOUT       (RADIOLOGY WITH ANESTHESIA);  Surgeon: Medication Radiologist, MD;  Location: [facility] OR;  Service: Radiology;  Laterality: N/A;   Family History  Problem Relation Age of Onset  . Hypertension Sister   . Diabetes Maternal Grandmother   . Cancer Mother     died young (pt was only in [employer] @ the time)  . Other Father     died in his 34's.   Social  History  Substance Use Topics  . Smoking status: Never Smoker   . Smokeless tobacco: Never Used  . Alcohol Use: No   OB History    No data available     Review of Systems  Ten systems are reviewed and are negative for acute change except as noted in the HPI  Allergies  Review of patient's allergies indicates no known allergies.  Home Medications   Prior to Admission medications   Medication Sig Start Date End Date Taking? Authorizing Provider  amiodarone (PACERONE) 100 MG tablet Take 1 tablet (100 mg total) by mouth daily. 11/30/14   Amy D Filbert Schilder,  NP  magnesium oxide (MAG-OX) 400 MG tablet Take 200 mg by mouth daily as needed (leg cramping).    Historical Provider, MD  milrinone (PRIMACOR) 20 MG/100 ML SOLN infusion Inject 11.25 mcg/min into the vein continuous. 09/12/15   Alison Murray, MD  oxyCODONE (OXY IR/ROXICODONE) 5 MG immediate release tablet Take 2 tablets (10 mg total) by mouth every 4 (four) hours as needed for moderate pain or severe pain. 06/16/15   Tiffany L Reed, DO  potassium chloride SA (K-DUR,KLOR-CON) 20 MEQ tablet Take 20 mEq by mouth daily.    Historical Provider, MD  sodium chloride 0.9 % SOLN with milrinone 1 MG/ML SOLN 200 mcg/mL Inject 0.25 mcg/kg/min into the vein continuous. /140 ml Weight 45 kg    Historical Provider, MD  spironolactone (ALDACTONE) 25 MG tablet Take 0.5 tablets (12.5 mg total) by mouth daily. 09/06/15   Graciella Freer, PA-C  torsemide (DEMADEX) 20 MG tablet Take 20 mg by mouth every other day.    Historical Provider, MD  warfarin (COUMADIN) 2 MG tablet Take 3 tablets (6 mg total) by mouth daily. 09/16/15   Alison Murray, MD   BP 118/78 mmHg  Pulse 90  Temp(Src) 97.9 F (36.6 C) (Oral)  Resp 19  Ht  (1.575 m)  Wt 44 kg  BMI 17.74 kg/m2  SpO2 97% Physical Exam  Constitutional: She is oriented to person, place, and time. She appears well-developed and well-nourished. No distress.  HENT:  Head: Normocephalic and atraumatic.  Mouth/Throat: No oropharyngeal exudate.  Oropharynx dry  Eyes: Conjunctivae and EOM are normal. Right eye exhibits no discharge. Left eye exhibits no discharge. No scleral icterus.  Neck: No tracheal deviation present.  Cardiovascular: Normal heart sounds and intact distal pulses.  Exam reveals no gallop and no friction rub.   No murmur heard. Pulmonary/Chest: Effort normal and breath sounds normal. No respiratory distress. She has no wheezes. She has no rales. She exhibits no tenderness.  Abdominal: Soft. Bowel sounds are normal. She exhibits no  distension and no mass. There is no tenderness. There is no rebound and no guarding.  Musculoskeletal: Normal range of motion. She exhibits no edema or tenderness.  Lymphadenopathy:    She has no cervical adenopathy.  Neurological: She is alert and oriented to person, place, and time. Coordination normal.  Cranial nerves 2-12 intact. Normal finger to nose, RAM  Skin: Skin is warm and dry. No rash noted. She is not diaphoretic. No erythema.  Psychiatric: She has a normal mood and affect. Her behavior is normal.  Nursing note and vitals reviewed.   ED Course  Procedures  Labs Review Labs Reviewed  CBC  BASIC METABOLIC PANEL  URINALYSIS, ROUTINE W REFLEX MICROSCOPIC (NOT AT [facility])  Rosezena Sensor, ED   Imaging Review Dg Chest 2 View  10/10/2015  CLINICAL DATA:  Syncopal episode  and dizziness today. EXAM: CHEST  2 VIEW COMPARISON:  09/09/2015 FINDINGS: Prominent cardiopericardial silhouette consistent with persistent pericardial effusion and cardiac enlargement. The right IJ catheter is stable. The lungs are clear. No pulmonary edema or pleural effusions. The bony thorax is intact. T10 compression fracture stable since March 2017 IMPRESSION: Stable pericardial effusion. No acute pulmonary findings. T10 compression fracture, stable. Electronically Signed   By: Rudie Meyer M.D.   On: 10/10/2015 16:12   Ct Head Wo Contrast  10/10/2015  CLINICAL DATA:  Syncope today.  Possible fall.  Initial encounter. EXAM: CT HEAD WITHOUT CONTRAST TECHNIQUE: Contiguous axial images were obtained from the base of the skull through the vertex without intravenous contrast. COMPARISON:  Head CT scan 04/17/2005. FINDINGS: The brain appears normal without hemorrhage, infarct, mass lesion, mass effect, midline shift or abnormal extra-axial fluid collection. No hydrocephalus or pneumocephalus. The calvarium is intact. Imaged paranasal sinuses and mastoid air cells are clear. Carotid atherosclerosis is noted.  IMPRESSION: No acute abnormality. Atherosclerosis. Electronically Signed   By: Drusilla Kanner M.D.   On: 10/10/2015 16:38   I have personally reviewed and evaluated these images and lab results as part of my medical decision-making.   EKG Interpretation None      MDM   Final diagnoses:  Syncope, unspecified syncope type   Patient nontoxic appearing, VSS. Broad workup for syncope initiated for infectious vs cardiac vs neuro etiologies.  Troponin, CBC, BMP, chest x-ray, CT head, EKG unremarkable for acute change. Patient feeling improved after milrinone drip.  Patient will need to be admitted for further observation; hospitalist advises tele obs.   Melton Krebs, PA-C 10/10/15 1859  Derwood Kaplan, MD 10/13/15 1626

## 2015-10-11 ENCOUNTER — Other Ambulatory Visit: Payer: Self-pay

## 2015-10-11 ENCOUNTER — Encounter (HOSPITAL_COMMUNITY): Payer: Self-pay | Admitting: General Practice

## 2015-10-11 DIAGNOSIS — I110 Hypertensive heart disease with heart failure: Secondary | ICD-10-CM | POA: Diagnosis present

## 2015-10-11 DIAGNOSIS — Z79899 Other long term (current) drug therapy: Secondary | ICD-10-CM | POA: Diagnosis not present

## 2015-10-11 DIAGNOSIS — I480 Paroxysmal atrial fibrillation: Secondary | ICD-10-CM | POA: Diagnosis present

## 2015-10-11 DIAGNOSIS — I213 ST elevation (STEMI) myocardial infarction of unspecified site: Secondary | ICD-10-CM | POA: Diagnosis not present

## 2015-10-11 DIAGNOSIS — R55 Syncope and collapse: Principal | ICD-10-CM

## 2015-10-11 DIAGNOSIS — Z8249 Family history of ischemic heart disease and other diseases of the circulatory system: Secondary | ICD-10-CM | POA: Diagnosis not present

## 2015-10-11 DIAGNOSIS — Z9114 Patient's other noncompliance with medication regimen: Secondary | ICD-10-CM | POA: Diagnosis not present

## 2015-10-11 DIAGNOSIS — E43 Unspecified severe protein-calorie malnutrition: Secondary | ICD-10-CM | POA: Diagnosis present

## 2015-10-11 DIAGNOSIS — Z9119 Patient's noncompliance with other medical treatment and regimen: Secondary | ICD-10-CM | POA: Diagnosis not present

## 2015-10-11 DIAGNOSIS — I447 Left bundle-branch block, unspecified: Secondary | ICD-10-CM | POA: Diagnosis present

## 2015-10-11 DIAGNOSIS — I313 Pericardial effusion (noninflammatory): Secondary | ICD-10-CM | POA: Diagnosis present

## 2015-10-11 DIAGNOSIS — I5022 Chronic systolic (congestive) heart failure: Secondary | ICD-10-CM

## 2015-10-11 DIAGNOSIS — Z681 Body mass index (BMI) 19 or less, adult: Secondary | ICD-10-CM | POA: Diagnosis not present

## 2015-10-11 DIAGNOSIS — Z7901 Long term (current) use of anticoagulants: Secondary | ICD-10-CM | POA: Diagnosis not present

## 2015-10-11 DIAGNOSIS — I429 Cardiomyopathy, unspecified: Secondary | ICD-10-CM | POA: Diagnosis present

## 2015-10-11 LAB — CBC
HCT: 36.9 % (ref 36.0–46.0)
Hemoglobin: 11.8 g/dL — ABNORMAL LOW (ref 12.0–15.0)
MCH: 27.1 pg (ref 26.0–34.0)
MCHC: 32 g/dL (ref 30.0–36.0)
MCV: 84.6 fL (ref 78.0–100.0)
PLATELETS: 235 10*3/uL (ref 150–400)
RBC: 4.36 MIL/uL (ref 3.87–5.11)
RDW: 15.8 % — AB (ref 11.5–15.5)
WBC: 7 10*3/uL (ref 4.0–10.5)

## 2015-10-11 LAB — PROTIME-INR
INR: 1.47 (ref 0.00–1.49)
PROTHROMBIN TIME: 17.9 s — AB (ref 11.6–15.2)

## 2015-10-11 LAB — BASIC METABOLIC PANEL
Anion gap: 9 (ref 5–15)
BUN: 27 mg/dL — AB (ref 6–20)
CALCIUM: 9.8 mg/dL (ref 8.9–10.3)
CO2: 27 mmol/L (ref 22–32)
CREATININE: 1.03 mg/dL — AB (ref 0.44–1.00)
Chloride: 102 mmol/L (ref 101–111)
GFR, EST NON AFRICAN AMERICAN: 54 mL/min — AB (ref >60)
Glucose, Bld: 95 mg/dL (ref 65–99)
Potassium: 4.1 mmol/L (ref 3.5–5.1)
SODIUM: 138 mmol/L (ref 135–145)

## 2015-10-11 LAB — GLUCOSE, CAPILLARY: GLUCOSE-CAPILLARY: 105 mg/dL — AB (ref 65–99)

## 2015-10-11 MED ORDER — WARFARIN SODIUM 5 MG PO TABS
8.0000 mg | ORAL_TABLET | Freq: Once | ORAL | Status: AC
  Administered 2015-10-11: 8 mg via ORAL
  Filled 2015-10-11: qty 1

## 2015-10-11 MED ORDER — TORSEMIDE 20 MG PO TABS: 20.0000 mg | ORAL_TABLET | Freq: Every day | ORAL | Status: DC | PRN

## 2015-10-11 MED ORDER — SPIRONOLACTONE 25 MG PO TABS: 12.5000 mg | ORAL_TABLET | Freq: Every day | ORAL | Status: DC

## 2015-10-11 MED ORDER — TORSEMIDE 20 MG PO TABS
20.0000 mg | ORAL_TABLET | ORAL | Status: DC
  Administered 2015-10-11: 20 mg via ORAL
  Filled 2015-10-11: qty 1

## 2015-10-11 MED ORDER — SPIRONOLACTONE 25 MG PO TABS
12.5000 mg | ORAL_TABLET | Freq: Every day | ORAL | Status: DC
  Administered 2015-10-11 – 2015-10-12 (×2): 12.5 mg via ORAL
  Filled 2015-10-11 (×2): qty 1

## 2015-10-11 MED ORDER — AMIODARONE HCL 200 MG PO TABS
200.0000 mg | ORAL_TABLET | Freq: Every day | ORAL | Status: DC
  Administered 2015-10-12: 200 mg via ORAL
  Filled 2015-10-11: qty 1

## 2015-10-11 MED ORDER — BOOST / RESOURCE BREEZE PO LIQD
1.0000 | Freq: Two times a day (BID) | ORAL | Status: DC
  Administered 2015-10-11 – 2015-10-12 (×2): 1 via ORAL

## 2015-10-11 MED ORDER — SODIUM CHLORIDE 0.9% FLUSH
10.0000 mL | INTRAVENOUS | Status: DC | PRN
  Administered 2015-10-11 – 2015-10-12 (×2): 20 mL
  Administered 2015-10-12: 10 mL
  Filled 2015-10-11 (×3): qty 40

## 2015-10-11 NOTE — Consult Note (Signed)
Advanced Heart Failure Team Consult Note  Referring Physician: Dr Rama PCP: Dr. Duanne Guess Cardiology: Dr. Shirlee Latch  Reason for Consultation: Syncope/ Milrinone patient  HPI:    Katrina Peck is a 70 y.o. female with history of HTN who was admitted 07/20/14 with 3 months of exertional dyspnea, weight gain, cough, and orthopnea. Found to have EF 20% and a large pericardial effusion. Cath 5/16 no CAD. Hospital course complicated by atrial fibrillation w/ RVR, converted to NSR on amiodarone. Underwent pericardial window (transudate).   She has had several admission for infections likely due to her PICC line, including a T11/T12 discitis.  Last seen in HF clinic 09/20/15 and thought to be stable.  Have previously discussed LVAD which patient has not shown any interest in.   Presented to [facility] 10/10/15 after syncopal episode during which she fell to her knees and was poorly response per her husband.  EKG with SR and incomplete LBBB. CXR with no acute cardiopulmonary disease. Head CT negative for acute intracranial abnormality.  Other pertinent admission labs include K 4.4, Mg 1.9, Creatinine 1.02, WBC 8.9. Troponin WNL. Per H&P pt was out of milrinone when she arrived to the ED, but per pt she still had some in bag and was due for change out that day.   Pt states she was crouched in the kitchen feeling weak when her husband found her and called 911.  She had been out in the heat for a long time that day, and then came in and had two slices of very cold watermelon.   She denies any symptoms prior to, and was at her usual state of health leading up that day.  She felt very weak but thought it was due to being out in the hot heat. She does not feel like she lost consciousness, and states she was aware of everything, just very weak.  No fever, chills, cough, N/V/D.  No CP    Of note, pt states she has only been taking spironolactone and amiodarone "as needed" for quite some time.  She is unable to tell me how  often she takes them.  States she is afraid of the side effects.    Review of Systems: [y] = yes,  = no   General: Weight gain ; Weight loss ; Anorexia ; Fatigue [y]; Fever ; Chills ; Weakness [y]  Cardiac: Chest pain/pressure ; Resting SOB ; Exertional SOB ; Orthopnea ; Pedal Edema ; Palpitations ; Syncope [?]; Presyncope ; Paroxysmal nocturnal dyspnea[ ]   Pulmonary: Cough ; Wheezing[ ] ; Hemoptysis[ ] ; Sputum ; Snoring   GI: Vomiting[ ] ; Dysphagia[ ] ; Melena[ ] ; Hematochezia ; Heartburn[ ] ; Abdominal pain ; Constipation ; Diarrhea ; BRBPR   GU: Hematuria[ ] ; Dysuria ; Nocturia[ ]   Vascular: Pain in legs with walking ; Pain in feet with lying flat ; Non-healing sores ; Stroke ; TIA ; Slurred speech ;  Neuro: Headaches[ ] ; Vertigo[ ] ; Seizures[ ] ; Paresthesias[ ] ;Blurred vision ; Diplopia ; Vision changes   Ortho/Skin: Arthritis ; Joint pain ; Muscle pain ; Joint swelling ; Back Pain ; Rash   Psych: Depression[ ] ; Anxiety[ ]   Heme: Bleeding problems ; Clotting disorders ; Anemia   Endocrine: Diabetes ; Thyroid dysfunction[ ]   Home Medications  Prior to Admission medications   Medication Sig Start Date End Date Taking? Authorizing Provider  amiodarone (PACERONE) 100 MG tablet Take 1 tablet (100 mg total) by mouth daily. 11/30/14  Yes Amy D Clegg, NP  Digoxin (LANOXIN) 62.5 MCG TABS Take 1 tablet by mouth daily as needed.   Yes Historical Provider, MD  magnesium oxide (MAG-OX) 400 MG tablet Take 200 mg by mouth daily as needed (leg cramping).   Yes Historical Provider, MD  milrinone (PRIMACOR) 20 MG/100 ML SOLN infusion Inject 11.25 mcg/min into the vein continuous. 09/12/15  Yes Alison Murray, MD  oxyCODONE (OXY IR/ROXICODONE) 5 MG immediate release tablet Take 2 tablets (10 mg total) by mouth every 4 (four) hours as needed for moderate pain or severe pain. 06/16/15  Yes Tiffany  L Reed, DO  potassium chloride SA (K-DUR,KLOR-CON) 20 MEQ tablet Take 20 mEq by mouth daily.   Yes Historical Provider, MD  sodium chloride 0.9 % SOLN with milrinone 1 MG/ML SOLN 200 mcg/mL Inject 0.25 mcg/kg/min into the vein continuous. 140mg /140 ml Weight 45 kg   Yes Historical Provider, MD  spironolactone (ALDACTONE) 25 MG tablet Take 0.5 tablets (12.5 mg total) by mouth daily. Patient taking differently: Take 12.5 mg by mouth daily as needed.  09/06/15  Yes Graciella Freer, PA-C  torsemide (DEMADEX) 20 MG tablet Take 20 mg by mouth daily as needed (for fluid retention).    Yes Historical Provider, MD  warfarin (COUMADIN) 2 MG tablet Take 3 tablets (6 mg total) by mouth daily. Patient taking differently: Take 6-8 mg by mouth See admin instructions. 6 mg on Sun/Tues/Wed/Fri and 8 mg on Mon/Thurs/Sat 09/16/15  Yes Alison Murray, MD    Past Medical History: Past Medical History  Diagnosis Date  . Anemia   . Hypertension   . Blood transfusion without reported diagnosis   . Chronic systolic CHF (congestive heart failure) (HCC)     a. 06/2014 Echo: EF 20%.  Marland Kitchen NICM (nonischemic cardiomyopathy) (HCC)     a. 06/2014 Echo: EF 20%, diff HK, mild MR, mildly dil LA/RA, mildly reduced RV fxn;  b. 06/2014 Cath: nl cors.  . Pericardial effusion     a. 06/2014 Large effusion, no tamponade, s/p window for diag/therap - neg malig. b. 6/16 - recurrence w/o tamponade, subsequently folloed by echoes. Mod 12/16.  Marland Kitchen PAF (paroxysmal atrial fibrillation) (HCC)     a. with RVR, converted to NSR with amiodarone.  . Noncompliance   . LV (left ventricular) mural thrombus (HCC)     a. Noted on 12/16 echo.   . Bacteremia     a. MSSA bacteremia and discitis in 3/17, suspect tunneled catheter infection.   . Discitis   . Syncope 09/2015    Past Surgical History: Past Surgical History  Procedure Laterality Date  . Left and right heart catheterization with coronary angiogram N/A 07/23/2014    Procedure: LEFT  AND RIGHT HEART CATHETERIZATION WITH CORONARY ANGIOGRAM;  Surgeon: Laurey Morale, MD;  Location: [facility] CATH LAB;  Service: Cardiovascular;  Laterality: N/A;  . Subxyphoid pericardial window N/A 07/26/2014    Procedure: SUBXYPHOID PERICARDIAL WINDOW;  Surgeon: Delight Ovens, MD;  Location: [facility] OR;  Service: Thoracic;  Laterality: N/A;  . Tee without cardioversion N/A 07/26/2014    Procedure: TRANSESOPHAGEAL ECHOCARDIOGRAM (TEE);  Surgeon: Delight Ovens, MD;  Location: [facility] OR;  Service: Thoracic;  Laterality: N/A;  . Pleural effusion drainage N/A 07/26/2014    Procedure: DRAINAGE OF PERICARDIAL EFFUSION;  Surgeon: Delight Ovens, MD;  Location: [facility] OR;  Service: Thoracic;  Laterality: N/A;  . Cardiac catheterization N/A 02/07/2015    Procedure: Right Heart Cath;  Surgeon: Laurey Morale, MD;  Location: [facility] INVASIVE CV LAB;  Service: Cardiovascular;  Laterality: N/A;  . Tee without cardioversion N/A 06/06/2015    Procedure: TRANSESOPHAGEAL ECHOCARDIOGRAM (TEE);  Surgeon: Dolores Patty, MD;  Location: [facility] ENDOSCOPY;  Service: Cardiovascular;  Laterality: N/A;  . Radiology with anesthesia N/A 06/08/2015    Procedure: MRI CERVICAL SPINE WITH/WITHOUT LUMBAR WITH/WITHOUT THORACIC WITH/WITHOUT       (RADIOLOGY WITH ANESTHESIA);  Surgeon: Medication Radiologist, MD;  Location: [facility] OR;  Service: Radiology;  Laterality: N/A;  . Peripherally inserted central catheter insertion  10/10/2015    Family History: Family History  Problem Relation Age of Onset  . Hypertension Sister   . Diabetes Maternal Grandmother   . Cancer Mother     died young (pt was only in [employer] @ the time)  . Other Father     died in his 86's.    Social History: Social History   Social History  . Marital Status: Married    Spouse Name: N/A  . Number of Children: N/A  . Years of Education: N/A   Social History Main Topics  . Smoking status: Never Smoker   . Smokeless tobacco: Never Used  . Alcohol Use: No  . Drug  Use: No  . Sexual Activity: No   Other Topics Concern  . None   Social History Narrative   Lives in [place] with husband.  Not currently working.    Allergies:  No Known Allergies  Objective:    Vital Signs:   Temp:  [97.8 F (36.6 C)-98.4 F (36.9 C)] 97.8 F (36.6 C) (07/18 1231) Pulse Rate:  [86-99] 90 (07/18 1231) Resp:  [15-20] 20 (07/18 1231) BP: (96-119)/(63-91) 96/79 mmHg (07/18 1231) SpO2:  [97 %-100 %] 100 % (07/18 1231) Weight:  [97 lb 3.2 oz (44.09 kg)-99 lb 1.6 oz (44.951 kg)] 99 lb 1.6 oz (44.951 kg) (07/18 0548) Last BM Date: 10/10/15  Weight change: Filed Weights   10/10/15 1422 10/10/15 2035 10/11/15 0548  Weight: 97 lb (44 kg) 97 lb 3.2 oz (44.09 kg) 99 lb 1.6 oz (44.951 kg)    Intake/Output:   Intake/Output Summary (Last 24 hours) at 10/11/15 1512 Last data filed at 10/11/15 1410  Gross per 24 hour  Intake 481.26 ml  Output    300 ml  Net 181.26 ml     Physical Exam: General: Elderly, chronically ill appearing. Thin.Husband present.  HEENT: normal Neck: supple. JVP 7-8 cm. Carotids 2+ bilat; no bruits. No thyromegaly or nodule noted.  Cor: PMI nondisplaced. Regular rate & rhythm. Paradoxical S2 split Lungs: CTAB, normal effort.  Abdomen: soft, NT, ND, no HSM. No bruits or masses. +BS  Extremities: no cyanosis, clubbing, rash. No edema. Right upper chest tunneled catheter.  Neuro: alert & oriented x 3, cranial nerves grossly intact. moves all 4 extremities w/o difficulty. Affect pleasant.  Telemetry: NSR  Labs: Basic Metabolic Panel:  Recent Labs Lab 10/10/15 1526 10/10/15 2007 10/11/15 0352  NA 136  --  138  K 4.4  --  4.1  CL 103  --  102  CO2 25  --  27  GLUCOSE 93  --  95  BUN 23*  --  27*  CREATININE 1.02*  --  1.03*  CALCIUM 9.8  --  9.8  MG  --  1.9  --     Liver Function Tests: No results for input(s): AST, ALT, ALKPHOS, BILITOT, PROT, ALBUMIN in the last 168 hours. No results for input(s): LIPASE, AMYLASE in  the last 168 hours. No results for input(s): AMMONIA in the last 168 hours.  CBC:  Recent Labs Lab 10/10/15 1526 10/11/15 0352  WBC 8.9 7.0  HGB 12.7 11.8*  HCT 39.5 36.9  MCV 83.9 84.6  PLT 206 235    Cardiac Enzymes: No results for input(s): CKTOTAL, CKMB, CKMBINDEX, TROPONINI in the last 168 hours.  BNP: BNP (last 3 results)  Recent Labs  02/03/15 2310 03/22/15 2015 06/05/15 0330  BNP 4003.4* >4500.0* 3619.4*    ProBNP (last 3 results) No results for input(s): PROBNP in the last 8760 hours.   CBG:  Recent Labs Lab 10/11/15 0602  GLUCAP 105*    Coagulation Studies:  Recent Labs  10/10/15 1526 10/11/15 0352  LABPROT 18.0* 17.9*  INR 1.48 1.47    Other results: EKG: Vent Rate 89 bpm, LBBB  Imaging: Dg Chest 2 View  10/10/2015  CLINICAL DATA:  Syncopal episode and dizziness today. EXAM: CHEST  2 VIEW COMPARISON:  09/09/2015 FINDINGS: Prominent cardiopericardial silhouette consistent with persistent pericardial effusion and cardiac enlargement. The right IJ catheter is stable. The lungs are clear. No pulmonary edema or pleural effusions. The bony thorax is intact. T10 compression fracture stable since March 2017 IMPRESSION: Stable pericardial effusion. No acute pulmonary findings. T10 compression fracture, stable. Electronically Signed   By: Rudie Meyer M.D.   On: 10/10/2015 16:12   Ct Head Wo Contrast  10/10/2015  CLINICAL DATA:  Syncope today.  Possible fall.  Initial encounter. EXAM: CT HEAD WITHOUT CONTRAST TECHNIQUE: Contiguous axial images were obtained from the base of the skull through the vertex without intravenous contrast. COMPARISON:  Head CT scan 04/17/2005. FINDINGS: The brain appears normal without hemorrhage, infarct, mass lesion, mass effect, midline shift or abnormal extra-axial fluid collection. No hydrocephalus or pneumocephalus. The calvarium is intact. Imaged paranasal sinuses and mastoid air cells are clear. Carotid atherosclerosis is  noted. IMPRESSION: No acute abnormality. Atherosclerosis. Electronically Signed   By: Drusilla Kanner M.D.   On: 10/10/2015 16:38      Medications:     Current Medications: . amiodarone  100 mg Oral Daily  . feeding supplement  1 Container Oral BID BM  . sodium chloride flush  3 mL Intravenous Q12H  . sodium chloride flush  3 mL Intravenous Q12H  . warfarin  8 mg Oral ONCE-1800  . Warfarin - Pharmacist Dosing Inpatient   Does not apply q1800     Infusions: . milrinone 0.25 mcg/kg/min (10/11/15 1351)      Assessment/Plan   1. Syncope - With no clear diagnosis, will increase amiodarone to 200 mg daily in event that VT was involved.  It is a possibility with her significant LV dysfunction, although Electrolytes stable on admit.  2. Chronic systolic HF: Due to nonischemic cardiomyopathy. cMRI (8/16) showed LV EF 11%, RV EF 30%, and patchy mid-wall LGE in the basal-mid septum that may suggest prior myocarditis. She is now back on milrinone 0.25 mcg/kg/min via tunneled catheter with no problems. Echo (6/17) with EF 10%, LV thrombus not visualized. NYHA class II symptoms on milrinone. She is not volume overloaded on exam.  - Continue milrinone 0.25 mcg/kg/min.  ? If worsening low output was cause of weakness/?syncope.  Will check stat Coox and follow in hospital.  - Would resume torsemide 20 mg  every other day. JVP looks mildly elevated.  - Resume spironolactone 12.5 daily starting in the am.  - No beta blocker with low output.  - She has advanced heart failure with high risk of morbidity/mortality over the next 6 months but she has great difficulty following cardiology recommendations. Have previously mentioned LVAD to her in light of recurrent tunneled catheter infections and the limitations of home milrinone, but she remains uninterested in this.  - Previous EKG LBBB-like IVCD, QRS 138 msec. Unlikely that she would derive enough benefit from CRT to get off milrinone. It is  unlikely that she would agree to device placement. 3. PAF: NSR .  - Increase amio 200 mg daily. Recent LFTs and TSH stable.  She was told to get regular eye exams while on amiodarone.  - Continue warfarin.  INR Subtherapeutic at 1.4 today. No bleeding problems.  4. Pericardial effusion: S/p pericardial window. Transudate, cytology negative. Last echo in 6/17 showed small to moderate effusion.  5. LV thrombus: She is on warfarin as above. Thrombus not visualized on most recent echo.  6. Enterobacter cloacae tunneled catheter infection:  - Finished ertepenem until 7/1.    Suspect that overall this patient has a very poor prognosis.  She has very poor insight into her disease and recurrent non-compliance of her medications and refuses to follow recommendations for OMT.   Have stressed the importance of her medications with her today.  Length of Stay:   Graciella Freer PA-C 10/11/2015, 3:12 PM  Advanced Heart Failure Team Pager [phone] (M-F; 7a - 4p)  Please contact [facility] for night-coverage after hours (4p -7a ) and weekends on [email or web address]  Patient seen with PA, agree with the above note.  She had a presyncope versus syncopal spell prior to admission.  Labs all look good.  She feels good.  She had stopped her amiodarone due to concern about side effects (has had no side effects).  She does not have an ICD due to personal preference.  Long history of medical noncompliance.  Now on home milrinone.  I am concerned that she had an arrhythmic event.  She is certainly at risk with EF 10% on home milrinone.  She says that she is willing to restart amiodarone and spironolactone, will restart both.  Will increase amiodarone to 200 mg daily for now.  She will need extensive education to make sure she stays.    Monitor overnight on telemetry, possibly home in am.   [name] 10/11/2015 4:54 PM

## 2015-10-11 NOTE — Progress Notes (Deleted)
ANTICOAGULATION CONSULT NOTE  Pharmacy Consult for Warfarin Indication: h/o LV thrombus  No Known Allergies  Patient Measurements: Height: 5\' 2"  (157.5 cm) Weight: 99 lb 1.6 oz (44.951 kg) (scale a) IBW/kg (Calculated) : 50.1  Vital Signs: Temp: 97.8 F (36.6 C) (07/18 0548) Temp Source: Oral (07/18 0548) BP: 97/64 mmHg (07/18 0915) Pulse Rate: 90 (07/18 0548)  Labs:  Recent Labs  10/10/15 1526 10/11/15 0352  HGB 12.7 11.8*  HCT 39.5 36.9  PLT 206 235  LABPROT 18.0* 17.9*  INR 1.48 1.47  CREATININE 1.02* 1.03*    Estimated Creatinine Clearance: 36.6 mL/min (by C-G formula based on Cr of 1.03).   Medications:  Prescriptions prior to admission  Medication Sig Dispense Refill Last Dose  . amiodarone (PACERONE) 100 MG tablet Take 1 tablet (100 mg total) by mouth daily. 30 tablet 5 Past Month at Unknown time  . Digoxin (LANOXIN) 62.5 MCG TABS Take 1 tablet by mouth daily as needed.   Past Month at Unknown time  . magnesium oxide (MAG-OX) 400 MG tablet Take 200 mg by mouth daily as needed (leg cramping).   Past Month at Unknown time  . milrinone (PRIMACOR) 20 MG/100 ML SOLN infusion Inject 11.25 mcg/min into the vein continuous. 11.25 mL 0 continuous at continuous  . oxyCODONE (OXY IR/ROXICODONE) 5 MG immediate release tablet Take 2 tablets (10 mg total) by mouth every 4 (four) hours as needed for moderate pain or severe pain. 30 tablet 0   . potassium chloride SA (K-DUR,KLOR-CON) 20 MEQ tablet Take 20 mEq by mouth daily.   Past Month at Unknown time  . sodium chloride 0.9 % SOLN with milrinone 1 MG/ML SOLN 200 mcg/mL Inject 0.25 mcg/kg/min into the vein continuous. 140mg /140 ml Weight 45 kg   Taking  . spironolactone (ALDACTONE) 25 MG tablet Take 0.5 tablets (12.5 mg total) by mouth daily. (Patient taking differently: Take 12.5 mg by mouth daily as needed. ) 30 tablet 3 Past Month at Unknown time  . torsemide (DEMADEX) 20 MG tablet Take 20 mg by mouth daily as needed (for  fluid retention).    10/09/2015 at pm  . warfarin (COUMADIN) 2 MG tablet Take 3 tablets (6 mg total) by mouth daily. (Patient taking differently: Take 6-8 mg by mouth See admin instructions. 6 mg on Sun/Tues/Wed/Fri and 8 mg on Mon/Thurs/Sat) 80 tablet 2 10/09/2015 at pm   Scheduled:  . amiodarone  100 mg Oral Daily  . feeding supplement  1 Container Oral BID BM  . sodium chloride flush  3 mL Intravenous Q12H  . sodium chloride flush  3 mL Intravenous Q12H  . Warfarin - Pharmacist Dosing Inpatient   Does not apply q1800   Infusions:  . milrinone 0.25 mcg/kg/min (10/10/15 1459)    Assessment: 70yo female with history of NICM on milrinone, PAF and nonadherence presents with a syncopal episode. Pharmacy is consulted to dose wafarin for history of LV thrombus. Admit INR on 7/17 was subtherapeutic at 1.48.  INRs: 7/18: 1.47  PTA Warfarin Dose: 8mg  Mon/Thur/Sat and 6mg  AODs with last outpatient dose 10/09/15  Goal of Therapy:  INR 2-3 Monitor platelets by anticoagulation protocol: Yes   Plan:  Warfarin 8mg  tonight x1 Daily INR/CBC Monitor s/sx of bleeding  Gwyndolyn Kaufman Bernette Redbird), PharmD  PGY1 Pharmacy Resident Pager: [phone] 10/11/2015 11:03 AM

## 2015-10-11 NOTE — Care Management Obs Status (Signed)
MEDICARE OBSERVATION STATUS NOTIFICATION   Patient Details  Name: Katrina Peck MRN: [id number] Date of Birth: Jul 18, 1945   Medicare Observation Status Notification Given:  Yes    Elliot Cousin, RN 10/11/2015, 11:40 AM

## 2015-10-11 NOTE — Progress Notes (Signed)
ANTICOAGULATION CONSULT NOTE  Pharmacy Consult for Warfarin Indication: h/o LV thrombus  No Known Allergies  Patient Measurements: Height: 5\' 2"  (157.5 cm) Weight: 99 lb 1.6 oz (44.951 kg) (scale a) IBW/kg (Calculated) : 50.1  Vital Signs: Temp: 97.8 F (36.6 C) (07/18 0548) Temp Source: Oral (07/18 0548) BP: 97/64 mmHg (07/18 0915) Pulse Rate: 90 (07/18 0548)  Labs:  Recent Labs  10/10/15 1526 10/11/15 0352  HGB 12.7 11.8*  HCT 39.5 36.9  PLT 206 235  LABPROT 18.0* 17.9*  INR 1.48 1.47  CREATININE 1.02* 1.03*    Estimated Creatinine Clearance: 36.6 mL/min (by C-G formula based on Cr of 1.03).   Medications:  Prescriptions prior to admission  Medication Sig Dispense Refill Last Dose  . amiodarone (PACERONE) 100 MG tablet Take 1 tablet (100 mg total) by mouth daily. 30 tablet 5 Past Month at Unknown time  . Digoxin (LANOXIN) 62.5 MCG TABS Take 1 tablet by mouth daily as needed.   Past Month at Unknown time  . magnesium oxide (MAG-OX) 400 MG tablet Take 200 mg by mouth daily as needed (leg cramping).   Past Month at Unknown time  . milrinone (PRIMACOR) 20 MG/100 ML SOLN infusion Inject 11.25 mcg/min into the vein continuous. 11.25 mL 0 continuous at continuous  . oxyCODONE (OXY IR/ROXICODONE) 5 MG immediate release tablet Take 2 tablets (10 mg total) by mouth every 4 (four) hours as needed for moderate pain or severe pain. 30 tablet 0   . potassium chloride SA (K-DUR,KLOR-CON) 20 MEQ tablet Take 20 mEq by mouth daily.   Past Month at Unknown time  . sodium chloride 0.9 % SOLN with milrinone 1 MG/ML SOLN 200 mcg/mL Inject 0.25 mcg/kg/min into the vein continuous. 140mg /140 ml Weight 45 kg   Taking  . spironolactone (ALDACTONE) 25 MG tablet Take 0.5 tablets (12.5 mg total) by mouth daily. (Patient taking differently: Take 12.5 mg by mouth daily as needed. ) 30 tablet 3 Past Month at Unknown time  . torsemide (DEMADEX) 20 MG tablet Take 20 mg by mouth daily as needed (for  fluid retention).    10/09/2015 at pm  . warfarin (COUMADIN) 2 MG tablet Take 3 tablets (6 mg total) by mouth daily. (Patient taking differently: Take 6-8 mg by mouth See admin instructions. 6 mg on Sun/Tues/Wed/Fri and 8 mg on Mon/Thurs/Sat) 80 tablet 2 10/09/2015 at pm   Scheduled:  . amiodarone  100 mg Oral Daily  . feeding supplement  1 Container Oral BID BM  . sodium chloride flush  3 mL Intravenous Q12H  . sodium chloride flush  3 mL Intravenous Q12H  . Warfarin - Pharmacist Dosing Inpatient   Does not apply q1800   Infusions:  . milrinone 0.25 mcg/kg/min (10/10/15 1459)    Assessment: 70yo female with history of NICM on milrinone, PAF and nonadherence presents with a syncopal episode. Pharmacy is consulted to dose wafarin for history of LV thrombus. Admit INR on 7/17 was subtherapeutic at 1.48.  INRs: 7/18: 1.47  PTA Warfarin Dose: 8mg  Mon/Thur/Sat and 6mg  AODs with last outpatient dose 10/09/15  Goal of Therapy:  INR 2-3 Monitor platelets by anticoagulation protocol: Yes   Plan:  Warfarin 8mg  tonight x1 Daily INR/CBC Monitor s/sx of bleeding  Gwyndolyn Kaufman Bernette Redbird), PharmD  PGY1 Pharmacy Resident Pager: [phone] 10/11/2015 11:05 AM

## 2015-10-11 NOTE — Patient Outreach (Signed)
[facility]) Care Management  10/11/2015  [name] 19-Jan-1946 [id number]  Received call from person identifying themselves as patients daughter.  States patient fell on yesterday 10/10/15 and was admitted to the hospital.   PLAN;  RNCM will notify [name] to close patient to [facility] heart failure program and have EMMI automated calls stopped.   RNCM will request [name] refer patient to hospital liaison for possible [facility] care management follow up post discharge from the hospital.  George Ina RN,BSN,CCM [facility] Telephonic  [phone]

## 2015-10-11 NOTE — Progress Notes (Signed)
Progress Note    Katrina Peck  TAE:[id number] DOB: 1945/06/25  DOA: 10/10/2015 PCP: No primary care provider on file.    Brief Narrative:   Katrina Peck is an 70 y.o. female with medical history significant for nonischemic cardiomyopathy with EF 10% on chronic milrinone infusion, paroxysmal atrial fibrillation, and nonadherence to her treatment plan whoWas admitted 10/10/15 with a chief complaint of syncope. Upon arrival to the ED, patient was found to be afebrile, saturating well on room air, and with vital signs stable. EKG demonstrateed sinus rhythm with incomplete LBBB and chest x-ray was negative for acute cardiopulmonary disease. Head CT was obtained and negative for acute intracranial abnormality. BMP features a very mild elevation in BUN and creatinine. CBC unremarkable and troponin was within the normal limits.   Assessment/Plan:   Principal Problem:   Syncope In a patient with paroxysmal atrial fibrillation and medical nonadherence Likely cardiogenic. CT of the head negative. Orthostatic vital signs negative. Amiodarone resumed. Continue milrinone. Has a history of noncompliance with medication regimen, which has likely precipitated an arrhythmia. Continue to monitor on telemetry. 12-lead EKG showed sinus rhythm at 89 bpm with IVCD and atypical LBBB. Will request EPS consultation.  Active Problems:   Chronic systolic heart failure (HCC) No evidence of decompensation at present. Torsemide/spironolactone held on admission. Continue spironolactone daily and Demadex every other day.    LV (left ventricular) mural thrombus (HCC) Continue Coumadin per pharmacy.    Protein-calorie malnutrition, severe [facility]) Dietitian consultation requested. Pre-albumin 33.7.    Family Communication/Anticipated D/C date and plan/Code Status   DVT prophylaxis: Coumadin ordered. Code Status: Full Code.  Family Communication: No family at bedside. Disposition Plan: Lives with husband. Can  return home after cardiology consultation when cleared.   Medical Consultants:    Cardiology   Procedures:    Anti-Infectives:   Anti-infectives    None      Subjective:   Katrina Peck has not had any further dizzy spells. No chest pain. Reports that she gets dizzy when she "talks a lot ". No current complaints of dyspnea.  Objective:    Filed Vitals:   10/10/15 1815 10/10/15 1945 10/10/15 2035 10/11/15 0548  BP: 119/81 115/84 117/87 101/63  Pulse: 88 86 99 90  Temp:   98.4 F (36.9 C) 97.8 F (36.6 C)  TempSrc:   Oral Oral  Resp: 19 15 18 19   Height:   5\' 2"  (1.575 m)   Weight:   44.09 kg (97 lb 3.2 oz) 44.951 kg (99 lb 1.6 oz)  SpO2: 100% 100% 100% 100%    Intake/Output Summary (Last 24 hours) at 10/11/15 0827 Last data filed at 10/11/15 0800  Gross per 24 hour  Intake  57.86 ml  Output    300 ml  Net -242.14 ml   Filed Weights   10/10/15 1422 10/10/15 2035 10/11/15 0548  Weight: 44 kg (97 lb) 44.09 kg (97 lb 3.2 oz) 44.951 kg (99 lb 1.6 oz)    Exam: General exam: Appears calm and comfortable. Thin. Respiratory system: Clear to auscultation. Respiratory effort normal. Cardiovascular system: S1 & S2 heard, RRR. No JVD,  rubs, gallops or clicks. No murmurs. Gastrointestinal system: Abdomen is nondistended, soft and nontender. No organomegaly or masses felt. Normal bowel sounds heard. Central nervous system: Alert and oriented. No focal neurological deficits. Extremities: No clubbing, edema, or cyanosis. Skin: No rashes, lesions or ulcers Psychiatry: Judgement and insight appear normal. Mood & affect appropriate.  Data Reviewed:   I have personally reviewed following labs and imaging studies:  Labs: Basic Metabolic Panel:  Recent Labs Lab 10/10/15 1526 10/10/15 2007 10/11/15 0352  NA 136  --  138  K 4.4  --  4.1  CL 103  --  102  CO2 25  --  27  GLUCOSE 93  --  95  BUN 23*  --  27*  CREATININE 1.02*  --  1.03*  CALCIUM 9.8  --  9.8    MG  --  1.9  --    GFR Estimated Creatinine Clearance: 36.6 mL/min (by C-G formula based on Cr of 1.03). Liver Function Tests: No results for input(s): AST, ALT, ALKPHOS, BILITOT, PROT, ALBUMIN in the last 168 hours. No results for input(s): LIPASE, AMYLASE in the last 168 hours. No results for input(s): AMMONIA in the last 168 hours. Coagulation profile  Recent Labs Lab 10/06/15 10/10/15 1526 10/11/15 0352  INR 1.9 1.48 1.47    CBC:  Recent Labs Lab 10/10/15 1526 10/11/15 0352  WBC 8.9 7.0  HGB 12.7 11.8*  HCT 39.5 36.9  MCV 83.9 84.6  PLT 206 235   CBG:  Recent Labs Lab 10/11/15 0602  GLUCAP 105*   Thyroid function studies:  Recent Labs  10/10/15 1958  TSH 2.110   Microbiology No results found for this or any previous visit (from the past 240 hour(s)).  Radiology: Dg Chest 2 View  10/10/2015  CLINICAL DATA:  Syncopal episode and dizziness today. EXAM: CHEST  2 VIEW COMPARISON:  09/09/2015 FINDINGS: Prominent cardiopericardial silhouette consistent with persistent pericardial effusion and cardiac enlargement. The right IJ catheter is stable. The lungs are clear. No pulmonary edema or pleural effusions. The bony thorax is intact. T10 compression fracture stable since March 2017 IMPRESSION: Stable pericardial effusion. No acute pulmonary findings. T10 compression fracture, stable. Electronically Signed   By: Rudie Meyer M.D.   On: 10/10/2015 16:12   Ct Head Wo Contrast  10/10/2015  CLINICAL DATA:  Syncope today.  Possible fall.  Initial encounter. EXAM: CT HEAD WITHOUT CONTRAST TECHNIQUE: Contiguous axial images were obtained from the base of the skull through the vertex without intravenous contrast. COMPARISON:  Head CT scan 04/17/2005. FINDINGS: The brain appears normal without hemorrhage, infarct, mass lesion, mass effect, midline shift or abnormal extra-axial fluid collection. No hydrocephalus or pneumocephalus. The calvarium is intact. Imaged paranasal  sinuses and mastoid air cells are clear. Carotid atherosclerosis is noted. IMPRESSION: No acute abnormality. Atherosclerosis. Electronically Signed   By: Drusilla Kanner M.D.   On: 10/10/2015 16:38    Medications:   . amiodarone  100 mg Oral Daily  . sodium chloride flush  3 mL Intravenous Q12H  . sodium chloride flush  3 mL Intravenous Q12H  . Warfarin - Pharmacist Dosing Inpatient   Does not apply q1800   Continuous Infusions: . milrinone 0.25 mcg/kg/min (10/10/15 1459)    Time spent: 35 minutes with > 50% of time discussing current diagnostic test results, clinical impression and plan of care.     RAMA,CHRISTINA  [facility] Pager [phone]. If unable to reach me by pager, please call my cell phone at [phone].  *Please refer to [email or web address], password [email or web address] to get updated schedule on who will round on this patient, as hospitalists switch teams weekly. If 7PM-7AM, please contact night-coverage at [email or web address], password [email or web address] for any overnight needs.  10/11/2015, 8:27 AM

## 2015-10-11 NOTE — Progress Notes (Signed)
[facility]  Patient Status: Active pt with [facility] this admission  [facility] is providing the following services: HHRN and [facility] pharmacy team for home Milrinone.  [facility] hospital team will follow [name] while here and support DC home to when ordered.   If patient discharges after hours, please call [phone].   Katrina Peck 10/11/2015, 10:02 AM

## 2015-10-11 NOTE — Progress Notes (Signed)
Initial Nutrition Assessment  DOCUMENTATION CODES:   Underweight, Non-severe (moderate) malnutrition in context of chronic illness  INTERVENTION:  Provide Boost Breeze po BID, each supplement provides 250 kcal and 9 grams of protein Provide snacks BID Encourage adequate healthful PO intake   NUTRITION DIAGNOSIS:   Malnutrition related to chronic illness as evidenced by mild depletion of body fat, moderate depletions of muscle mass.   GOAL:   Patient will meet greater than or equal to 90% of their needs   MONITOR:   PO intake, Supplement acceptance, Labs, Weight trends, I & O's, Skin  REASON FOR ASSESSMENT:   Consult Assessment of nutrition requirement/status  ASSESSMENT:   70 y.o. female with medical history significant for nonischemic cardiomyopathy with EF 10% on chronic milrinone infusion, paroxysmal atrial fibrillation, and nonadherence to her treatment plan who presents the emergency department following a syncopal episode at home.   Pt states that she has been eating normally, but struggles with the blandness of a low sodium/no added salt diet. She usually eats less at the hospital than she does at home due to poor taste. Pt denies any recent weight loss. She has mild fat wasting and moderate muscle wasting per nutrition-focused physical exam. RD reviewed pt's dietary recall: Breakfast: 1 egg, 1 slice toast, 1 [place] sausage patty or 1 boiled egg, grits, OJ Lunch: TV dinner with 500-600 mg sodium Dinner: Pasta and fruit  RD emphasized the importance of consuming protein at every meal. Encouraged intake of chicken, eggs, beef, nuts, yogurt, milk, grains, fruits and vegetables. Emphasized the importance of adequate PO intake while in hospital. Pt agreeable to receiving snacks and Boost Breeze supplements. States that she drinks vanilla Boost at home sometimes. Encouraged pt to continue supplements and evening snack after discharge.   Labs and medications reviewed.    Diet Order:  Diet Heart Room service appropriate?: Yes; Fluid consistency:: Thin; Fluid restriction:: 1500 mL Fluid  Skin:  Reviewed, no issues  Last BM:  7/17  Height:   Ht Readings from Last 1 Encounters:  10/10/15 5\' 2"  (1.575 m)    Weight:   Wt Readings from Last 1 Encounters:  10/11/15 99 lb 1.6 oz (44.951 kg)    Ideal Body Weight:  50 kg  BMI:  Body mass index is 18.12 kg/(m^2).  Estimated Nutritional Needs:   Kcal:  1400-1600  Protein:  55-65 grams  Fluid:  1.5 L restriction per MD  EDUCATION NEEDS:   No education needs identified at this time  Dorothea Ogle RD, LDN Inpatient Clinical Dietitian Pager: [phone] After Hours Pager: [phone]

## 2015-10-11 NOTE — Progress Notes (Signed)
NURSING PROGRESS NOTE  TRINTY ALVILLAR [id number] Admission Data: 10/11/2015 8:06 AM Attending Provider: Maryruth Bun Rama, MD PCP:No primary care provider on file. Code Status: Full   Katrina Peck is a 70 y.o. female patient admitted from ED:  -No acute distress noted.  -No complaints of shortness of breath.  -No complaints of chest pain.   Cardiac Monitoring: Box # 37 in place. Cardiac monitor yields:normal sinus rhythm.  Blood pressure 101/63, pulse 90, temperature 97.8 F (36.6 C), temperature source Oral, resp. rate 19, height 5\' 2"  (1.575 m), weight 44.951 kg (99 lb 1.6 oz), SpO2 100 %.   IV Fluids:  IV in place, occlusive dsg intact without redness, IV cath forearm left, condition patent and no redness Milrinone @ 3.87ml/hr  Allergies:  Review of patient's allergies indicates no known allergies.  Past Medical History:   has a past medical history of Anemia; Hypertension; Blood transfusion without reported diagnosis; Chronic systolic CHF (congestive heart failure) (HCC); NICM (nonischemic cardiomyopathy) (HCC); Pericardial effusion; PAF (paroxysmal atrial fibrillation) (HCC); Noncompliance; LV (left ventricular) mural thrombus (HCC); Bacteremia; and Discitis.  Past Surgical History:   has past surgical history that includes No past surgeries; left and right heart catheterization with coronary angiogram (N/A, 07/23/2014); Subxyphoid pericardial window (N/A, 07/26/2014); TEE without cardioversion (N/A, 07/26/2014); Pleural effusion drainage (N/A, 07/26/2014); Cardiac catheterization (N/A, 02/07/2015); TEE without cardioversion (N/A, 06/06/2015); and Radiology with anesthesia (N/A, 06/08/2015).  Social History:   reports that she has never smoked. She has never used smokeless tobacco. She reports that she does not drink alcohol or use illicit drugs.  Skin: Clean, Dry and Intact with dry lower extremities   Patient/Family orientated to room. Information packet given to patient/family.  Admission inpatient armband information verified with patient/family to include name and date of birth and placed on patient arm. Side rails up x 2, fall assessment and education completed with patient/family. Patient/family able to verbalize understanding of risk associated with falls and verbalized understanding to call for assistance before getting out of bed. Call light within reach. Patient/family able to voice and demonstrate understanding of unit orientation instructions.    Will continue to evaluate and treat per MD orders.

## 2015-10-11 NOTE — Care Management Note (Signed)
Case Management Note  Patient Details  Name: Katrina Peck MRN: [id number] Date of Birth: 02/25/1946  Subjective/Objective:   CHF, Home Milrinone                  Action/Plan: Discharge Planning:  NCM spoke to pt and she is active with [facility] for IV Milrinone. Pt's husband will bring her IV Milrinone and pump at dc. [facility] Infusion RN will connect her to her medication prior to dc. Pt has RW at home. Husband at home to assist with care. Will need resumption of care orders for [facility] needed.   PCP -Maryelizabeth Rowan MD  Expected Discharge Date:    10/12/2015            Expected Discharge Plan:  Home w Home Health Services  In-House Referral:  NA  Discharge planning Services  CM Consult  Post Acute Care Choice:  Home Health, Resumption of Svcs/PTA Provider Choice offered to:  Patient  DME Arranged:  N/A DME Agency:  NA  HH Arranged:  RN HH Agency:  [facility]  Status of Service:  Completed, signed off  If discussed at Long Length of Stay Meetings, dates discussed:    Additional Comments:  Elliot Cousin, RN 10/11/2015, 11:35 AM

## 2015-10-12 LAB — GLUCOSE, CAPILLARY: Glucose-Capillary: 101 mg/dL — ABNORMAL HIGH (ref 65–99)

## 2015-10-12 LAB — CBC
HEMATOCRIT: 36.5 % (ref 36.0–46.0)
Hemoglobin: 11.5 g/dL — ABNORMAL LOW (ref 12.0–15.0)
MCH: 26.6 pg (ref 26.0–34.0)
MCHC: 31.5 g/dL (ref 30.0–36.0)
MCV: 84.5 fL (ref 78.0–100.0)
PLATELETS: 245 10*3/uL (ref 150–400)
RBC: 4.32 MIL/uL (ref 3.87–5.11)
RDW: 15.7 % — AB (ref 11.5–15.5)
WBC: 5.6 10*3/uL (ref 4.0–10.5)

## 2015-10-12 LAB — CARBOXYHEMOGLOBIN
Carboxyhemoglobin: 0.7 % (ref 0.5–1.5)
METHEMOGLOBIN: 0.9 % (ref 0.0–1.5)
O2 Saturation: 79 %
TOTAL HEMOGLOBIN: 12 g/dL (ref 12.0–16.0)

## 2015-10-12 LAB — MAGNESIUM: MAGNESIUM: 1.9 mg/dL (ref 1.7–2.4)

## 2015-10-12 LAB — PROTIME-INR
INR: 2.31 — AB (ref 0.00–1.49)
Prothrombin Time: 25.2 seconds — ABNORMAL HIGH (ref 11.6–15.2)

## 2015-10-12 MED ORDER — TORSEMIDE 20 MG PO TABS: 20.0000 mg | ORAL_TABLET | ORAL | Status: DC

## 2015-10-12 MED ORDER — WARFARIN SODIUM 5 MG PO TABS: 8.0000 mg | ORAL_TABLET | ORAL | Status: DC

## 2015-10-12 MED ORDER — AMIODARONE HCL 200 MG PO TABS: 200.0000 mg | ORAL_TABLET | Freq: Every day | ORAL | Status: DC

## 2015-10-12 MED ORDER — POTASSIUM CHLORIDE CRYS ER 20 MEQ PO TBCR: 20.0000 meq | EXTENDED_RELEASE_TABLET | ORAL | Status: AC

## 2015-10-12 MED ORDER — WARFARIN SODIUM 3 MG PO TABS: 6.0000 mg | ORAL_TABLET | ORAL | Status: DC

## 2015-10-12 MED ORDER — OXYCODONE HCL 5 MG PO TABS: 5.0000 mg | ORAL_TABLET | Freq: Four times a day (QID) | ORAL | Status: AC | PRN

## 2015-10-12 MED ORDER — BOOST / RESOURCE BREEZE PO LIQD: 1.0000 | Freq: Two times a day (BID) | ORAL | Status: AC

## 2015-10-12 MED ORDER — SPIRONOLACTONE 25 MG PO TABS: 12.5000 mg | ORAL_TABLET | Freq: Every day | ORAL | Status: AC

## 2015-10-12 NOTE — Progress Notes (Signed)
Advanced Heart Failure Rounding Note  Referring Physician: Dr Darnelle Catalan PCP: Dr. Duanne Guess Cardiology: Dr. Shirlee Latch  Reason for Consultation: Syncope/ Milrinone patient  Subjective:     Feels Ok this morning.  Is adamant that she was taking coumadin at home despite long history of noncompliance and sub-therapeutic INR on admit.  States she will take her amio and spiro as directed now that she understand the importance.   Creatinine and K stable. Mg 1.9 on 10/10/15. Will recheck Mg this am.   Objective:   Weight Range: 96 lb 1.6 oz (43.591 kg) Body mass index is 17.57 kg/(m^2).   Vital Signs:   Temp:  [97.8 F (36.6 C)-98.2 F (36.8 C)] 98.2 F (36.8 C) (07/19 0653) Pulse Rate:  [82-94] 82 (07/19 0653) Resp:  [20] 20 (07/19 0653) BP: (96-112)/(72-83) 99/72 mmHg (07/19 0653) SpO2:  [100 %] 100 % (07/19 0653) Weight:  [96 lb 1.6 oz (43.591 kg)] 96 lb 1.6 oz (43.591 kg) (07/19 0653) Last BM Date: 10/11/15  Weight change: Filed Weights   10/10/15 2035 10/11/15 0548 10/12/15 0653  Weight: 97 lb 3.2 oz (44.09 kg) 99 lb 1.6 oz (44.951 kg) 96 lb 1.6 oz (43.591 kg)    Intake/Output:   Intake/Output Summary (Last 24 hours) at 10/12/15 0936 Last data filed at 10/12/15 0840  Gross per 24 hour  Intake  619.6 ml  Output   1300 ml  Net -680.4 ml     Physical Exam: General: Elderly, chronically ill appearing. Thin.Husband present.  HEENT: normal Neck: supple. JVP 7-8 cm. Carotids 2+ bilat; no bruits. No thyromegaly or nodule noted.  Cor: PMI nondisplaced. RRR. Paradoxical S2 split Lungs: Clear, normal effort.  Abdomen: soft, non-tender, non-distended, no HSM. No bruits or masses. +BS  Extremities: no cyanosis, clubbing, rash. No edema. Right upper chest tunneled catheter.  Neuro: alert & oriented x 3, cranial nerves grossly intact. moves all 4 extremities w/o difficulty. Affect pleasant.  Telemetry: Reviewed, NSR. One 3 beat run of NSVT  Labs: CBC  Recent Labs  10/11/15 0352 10/12/15 0510  WBC 7.0 5.6  HGB 11.8* 11.5*  HCT 36.9 36.5  MCV 84.6 84.5  PLT 235 245   Basic Metabolic Panel  Recent Labs  10/10/15 1526 10/10/15 2007 10/11/15 0352  NA 136  --  138  K 4.4  --  4.1  CL 103  --  102  CO2 25  --  27  GLUCOSE 93  --  95  BUN 23*  --  27*  CREATININE 1.02*  --  1.03*  CALCIUM 9.8  --  9.8  MG  --  1.9  --    Liver Function Tests No results for input(s): AST, ALT, ALKPHOS, BILITOT, PROT, ALBUMIN in the last 72 hours. No results for input(s): LIPASE, AMYLASE in the last 72 hours. Cardiac Enzymes No results for input(s): CKTOTAL, CKMB, CKMBINDEX, TROPONINI in the last 72 hours.  BNP: BNP (last 3 results)  Recent Labs  02/03/15 2310 03/22/15 2015 06/05/15 0330  BNP 4003.4* >4500.0* 3619.4*    ProBNP (last 3 results) No results for input(s): PROBNP in the last 8760 hours.   D-Dimer No results for input(s): DDIMER in the last 72 hours. Hemoglobin A1C No results for input(s): HGBA1C in the last 72 hours. Fasting Lipid Panel No results for input(s): CHOL, HDL, LDLCALC, TRIG, CHOLHDL, LDLDIRECT in the last 72 hours. Thyroid Function Tests  Recent Labs  10/10/15 1958  TSH 2.110    Other results:  Imaging/Studies:  Dg Chest 2 View  10/10/2015  CLINICAL DATA:  Syncopal episode and dizziness today. EXAM: CHEST  2 VIEW COMPARISON:  09/09/2015 FINDINGS: Prominent cardiopericardial silhouette consistent with persistent pericardial effusion and cardiac enlargement. The right IJ catheter is stable. The lungs are clear. No pulmonary edema or pleural effusions. The bony thorax is intact. T10 compression fracture stable since March 2017 IMPRESSION: Stable pericardial effusion. No acute pulmonary findings. T10 compression fracture, stable. Electronically Signed   By: Rudie Meyer M.D.   On: 10/10/2015 16:12   Ct Head Wo Contrast  10/10/2015  CLINICAL DATA:  Syncope today.  Possible fall.  Initial encounter. EXAM: CT  HEAD WITHOUT CONTRAST TECHNIQUE: Contiguous axial images were obtained from the base of the skull through the vertex without intravenous contrast. COMPARISON:  Head CT scan 04/17/2005. FINDINGS: The brain appears normal without hemorrhage, infarct, mass lesion, mass effect, midline shift or abnormal extra-axial fluid collection. No hydrocephalus or pneumocephalus. The calvarium is intact. Imaged paranasal sinuses and mastoid air cells are clear. Carotid atherosclerosis is noted. IMPRESSION: No acute abnormality. Atherosclerosis. Electronically Signed   By: Drusilla Kanner M.D.   On: 10/10/2015 16:38     Latest Echo  Latest Cath   Medications:     Scheduled Medications: . amiodarone  200 mg Oral Daily  . feeding supplement  1 Container Oral BID BM  . sodium chloride flush  3 mL Intravenous Q12H  . sodium chloride flush  3 mL Intravenous Q12H  . spironolactone  12.5 mg Oral Daily  . torsemide  20 mg Oral QODAY  . Warfarin - Pharmacist Dosing Inpatient   Does not apply q1800     Infusions: . milrinone 0.25 mcg/kg/min (10/11/15 1351)     PRN Medications:  sodium chloride, acetaminophen **OR** acetaminophen, ondansetron **OR** ondansetron (ZOFRAN) IV, polyethylene glycol, sodium chloride flush, sodium chloride flush   Assessment/Plan   Katrina Peck is a 70 y.o. female with Chronic systolic EF 10% on milrinone admitted after "syncopal" episode.   1. Syncope - With no clear diagnosis, increasde amiodarone to 200 mg daily in event that VT was involved. It is possible, even likely, with her significant LV dysfunction and use of milrinone - Recheck Magnesium this am.  If 1.8 or less would supp. Otherwise, no change. - K stable.  2. Chronic systolic HF: Due to nonischemic cardiomyopathy. cMRI (8/16) showed LV EF 11%, RV EF 30%, and patchy mid-wall LGE in the basal-mid septum that may suggest prior myocarditis. She is now back on milrinone 0.25 mcg/kg/min via tunneled catheter with  no problems. Echo (6/17) with EF 10%, LV thrombus not visualized. NYHA class II symptoms on milrinone. She is not volume overloaded on exam.  - Continue milrinone 0.25 mcg/kg/min. Coox stable this am.  - Continue torsemide 20 mg every other day. JVP looks mildly elevated.  - Restart spironolactone 12.5 daily  - No beta blocker with low output.  - She has advanced heart failure with high risk of morbidity/mortality over the next 6 months but she has great difficulty following cardiology recommendations. Have previously mentioned LVAD to her in light of recurrent tunneled catheter infections and the limitations of home milrinone, but she remains uninterested in this.  - Previous EKG LBBB-like IVCD, QRS 138 msec. Unlikely that she would derive enough benefit from CRT to get off milrinone. It is unlikely that she would agree to device placement. - Will leave off dig for now as she was not taking.  Can consider adding  back as outpatient.  3. PAF: NSR .  - Continue amio 200 mg daily. Recent LFTs and TSH stable. She was told to get regular eye exams while on amiodarone.  - Continue warfarin. INR now therapeutic at 2.3. Needs coumadin follow up. No bleeding problems.  4. Pericardial effusion: S/p pericardial window. Transudate, cytology negative. Last echo in 6/17 showed small to moderate effusion.  5. LV thrombus: She is on warfarin as above. Thrombus not visualized on most recent echo.  6. Enterobacter cloacae tunneled catheter infection:  - Finished ertepenem until 7/1.   No arrhythmias overnight or this admission.  Suspect patient may have had arrythmia leading to admission.  Increased amio to 200 mg daily.    She is stable for d/c from HF perspective.   HF meds for home ( I completed reconciliation for these medicines for home) Milrinone 0.25 mcg/kg/min  (will place order for [facility] to continue) Torsemide 20 mg every other day Spironolactone 12.5 mg daily Amiodarone 200 mg  daily.  Will also have [facility] check labs for electrolytes next week. Will schedule close HF follow up.   Length of Stay: 1  Graciella Freer PA-C 10/12/2015, 9:36 AM  Advanced Heart Failure Team Pager [phone] (M-F; 7a - 4p)  Please contact [facility] for night-coverage after hours (4p -7a ) and weekends on [email or web address]  Patient seen with PA, agree with the above note.  No arrhythmias overnight.  Her near-syncopal episode certainly could have been arrhyhmic as she had stopped her amiodarone.  She has resumed amiodarone at 200 mg daily and spironolactone at prior dose as well.  Will let her go home today with close followup.   [name] 10/12/2015 12:38 PM

## 2015-10-12 NOTE — Progress Notes (Signed)
Contacted [facility] for scheduled dc today. Husband will bring pt's Milrinone and pump at 230 pm for dc. [facility] Transfusion RN will come to set up for home. Isidoro Donning RN CCM Case Mgmt phone [phone]

## 2015-10-12 NOTE — Discharge Summary (Signed)
Katrina Peck, is a 69 y.o. female  DOB 1945-05-14  MRN [id number].  Admission date:  10/10/2015  Admitting Physician  Briscoe Deutscher, MD  Discharge Date:  10/12/2015   Primary MD  Maryelizabeth Rowan, MD  Recommendations for primary care physician for things to follow:  - Please check CBC, BMP during next visit.   Admission Diagnosis  Syncope, unspecified syncope type [R55]   Discharge Diagnosis  Syncope, unspecified syncope type [R55]    Principal Problem:   Syncope Active Problems:   Chronic systolic heart failure (HCC)   Paroxysmal atrial fibrillation (HCC)   LV (left ventricular) mural thrombus (HCC)   Non compliance w medication regimen   Protein-calorie malnutrition, severe (HCC)      Past Medical History  Diagnosis Date  . Anemia   . Hypertension   . Blood transfusion without reported diagnosis   . Chronic systolic CHF (congestive heart failure) (HCC)     a. 06/2014 Echo: EF 20%.  Marland Kitchen NICM (nonischemic cardiomyopathy) (HCC)     a. 06/2014 Echo: EF 20%, diff HK, mild MR, mildly dil LA/RA, mildly reduced RV fxn;  b. 06/2014 Cath: nl cors.  . Pericardial effusion     a. 06/2014 Large effusion, no tamponade, s/p window for diag/therap - neg malig. b. 6/16 - recurrence w/o tamponade, subsequently folloed by echoes. Mod 12/16.  Marland Kitchen PAF (paroxysmal atrial fibrillation) (HCC)     a. with RVR, converted to NSR with amiodarone.  . Noncompliance   . LV (left ventricular) mural thrombus (HCC)     a. Noted on 12/16 echo.   . Bacteremia     a. MSSA bacteremia and discitis in 3/17, suspect tunneled catheter infection.   . Discitis   . Syncope 09/2015    Past Surgical History  Procedure Laterality Date  . Left and right heart catheterization with coronary angiogram N/A 07/23/2014    Procedure: LEFT AND RIGHT HEART CATHETERIZATION WITH CORONARY ANGIOGRAM;  Surgeon: Laurey Morale, MD;  Location: [facility]  CATH LAB;  Service: Cardiovascular;  Laterality: N/A;  . Subxyphoid pericardial window N/A 07/26/2014    Procedure: SUBXYPHOID PERICARDIAL WINDOW;  Surgeon: Delight Ovens, MD;  Location: [facility] OR;  Service: Thoracic;  Laterality: N/A;  . Tee without cardioversion N/A 07/26/2014    Procedure: TRANSESOPHAGEAL ECHOCARDIOGRAM (TEE);  Surgeon: Delight Ovens, MD;  Location: [facility] OR;  Service: Thoracic;  Laterality: N/A;  . Pleural effusion drainage N/A 07/26/2014    Procedure: DRAINAGE OF PERICARDIAL EFFUSION;  Surgeon: Delight Ovens, MD;  Location: [facility] OR;  Service: Thoracic;  Laterality: N/A;  . Cardiac catheterization N/A 02/07/2015    Procedure: Right Heart Cath;  Surgeon: Laurey Morale, MD;  Location: [facility] INVASIVE CV LAB;  Service: Cardiovascular;  Laterality: N/A;  . Tee without cardioversion N/A 06/06/2015    Procedure: TRANSESOPHAGEAL ECHOCARDIOGRAM (TEE);  Surgeon: Dolores Patty, MD;  Location: [facility] ENDOSCOPY;  Service: Cardiovascular;  Laterality: N/A;  . Radiology with anesthesia N/A 06/08/2015    Procedure: MRI CERVICAL SPINE  WITH/WITHOUT LUMBAR WITH/WITHOUT THORACIC WITH/WITHOUT       (RADIOLOGY WITH ANESTHESIA);  Surgeon: Medication Radiologist, MD;  Location: [facility] OR;  Service: Radiology;  Laterality: N/A;  . Peripherally inserted central catheter insertion  10/10/2015       History of present illness and  Hospital Course:     Kindly see H&P for history of present illness and admission details, please review complete Labs, Consult reports and Test reports for all details in brief  HPI  from the history and physical done on the day of admission 10/10/2015  HPI: Katrina Peck is a 70 y.o. female with medical history significant for nonischemic cardiomyopathy with EF 10% on chronic milrinone infusion, paroxysmal atrial fibrillation, and nonadherence to her treatment plan who presents the emergency department following a syncopal episode at home. Patient reports that she went to bed  last night in her usual state of health and woke this morning with some mild lightheadedness and nausea. Symptoms continued to progress throughout the morning and he eventually she felt as though she would faint and remembers trying to lower herself to the ground. Her husband witnessed the episode, and reports that she appeared to fall to her knees and was poorly responsive for several seconds. Patient denies any associated chest pain, palpitations, diaphoresis, or dyspnea. There has been no headache, focal numbness or weakness, change in vision or hearing, or loss of coordination. She denies any recent change in her medications. She denies any recent illness, fevers, chills, or cough. There was no apparent injury suffered during the syncopal episode.  ED Course: Upon arrival to the ED, patient is found to be afebrile, saturating well on room air, and with vital signs stable. EKG demonstrates a sinus rhythm with incomplete LBBB and chest x-rays negative for acute cardiopulmonary disease. Head CT was obtained and negative for acute intracranial abnormality. BMP features a very mild elevation in BUN and creatinine. CBC is unremarkable and troponin is within the normal limits. Patient arrived to the emergency department without her milrinone and was started on milrinone infusion shortly after arrival. She remained hemodynamically stable in the ED with intraventricular conduction delay noted on telemetry monitoring. She'll be observed on the telemetry unit for ongoing evaluation and management of syncopal episode, possibly cardiogenic.  Review of Systems:   Hospital Course   Katrina Peck is an 70 y.o. female with medical history significant for nonischemic cardiomyopathy with EF 10% on chronic milrinone infusion, paroxysmal atrial fibrillation, and nonadherence to her treatment plan whoWas admitted 10/10/15 with a chief complaint of syncope. Upon arrival to the ED, patient was found to be afebrile, saturating  well on room air, and with vital signs stable. EKG demonstrateed sinus rhythm with incomplete LBBB and chest x-ray was negative for acute cardiopulmonary disease. Head CT was obtained and negative for acute intracranial abnormality. BMP features a very mild elevation in BUN and creatinine. CBC unremarkable and troponin was within the normal limits.   Syncope In a patient with paroxysmal atrial fibrillation and medical nonadherence Likely cardiogenic. CT of the head negative. Orthostatic vital signs negative. Amiodarone resumed. Continue milrinone. Has history of noncompliance, cardiology consult appreciated, her near syncopal episode certainly could have been arrhythmic as she had stopped her amiodarone, was resumed with Aldactone as per your dose, no arrhythmias overnight, and be discharged on home medication amiodarone and Aldactone.   Chronic systolic heart failure (HCC) No evidence of decompensation at present.  Continue spironolactone daily and Demadex every other day.  LV (left ventricular) mural thrombus (HCC) Continue Coumadin per pharmacy.   Protein-calorie malnutrition,  Continue supplement  Discharge Condition:  Stable   Follow UP  Follow-up Information    Follow up with [facility].   Why:  Home Health RN   Contact information:   [address] [place] [address] [phone]       Follow up with [facility], MD. Schedule an appointment as soon as possible for a visit in 1 week.   Specialty:  Family Medicine   Contact information:   [address] [place] [address] [phone]       Follow up with [facility] On 10/27/2015.   Specialty:  Cardiology   Why:  at 1120 for post hospital follow up. Please bring all medications Code for parking is 0003.   Contact information:   [address] [id number] mc [place] [address] [phone]        Discharge  Instructions  and  Discharge Medications         Discharge Instructions    Diet - low sodium heart healthy    Complete by:  As directed      Discharge instructions    Complete by:  As directed   Follow with Primary MD Maryelizabeth Rowan, MD in 7 days   Get CBC, CMP,  checked  by Primary MD next visit.    Activity: As tolerated with Full fall precautions use walker/cane & assistance as needed   Disposition Home **   Diet: Heart Healthy  , with feeding assistance and aspiration precautions.  For Heart failure patients - Check your Weight same time everyday, if you gain over 2 pounds, or you develop in leg swelling, experience more shortness of breath or chest pain, call your Primary MD immediately. Follow Cardiac Low Salt Diet and 1.5 lit/day fluid restriction.   On your next visit with your primary care physician please Get Medicines reviewed and adjusted.   Please request your Prim.MD to go over all Hospital Tests and Procedure/Radiological results at the follow up, please get all Hospital records sent to your Prim MD by signing hospital release before you go home.   If you experience worsening of your admission symptoms, develop shortness of breath, life threatening emergency, suicidal or homicidal thoughts you must seek medical attention immediately by calling 911 or calling your MD immediately  if symptoms less severe.  You Must read complete instructions/literature along with all the possible adverse reactions/side effects for all the Medicines you take and that have been prescribed to you. Take any new Medicines after you have completely understood and accpet all the possible adverse reactions/side effects.   Do not drive, operating heavy machinery, perform activities at heights, swimming or participation in water activities or provide baby sitting services if your were admitted for syncope or siezures until you have seen by Primary MD or a Neurologist and advised to do so  again.  Do not drive when taking Pain medications.    Do not take more than prescribed Pain, Sleep and Anxiety Medications  Special Instructions: If you have smoked or chewed Tobacco  in the last 2 yrs please stop smoking, stop any regular Alcohol  and or any Recreational drug use.  Wear Seat belts while driving.   Please note  You were cared for by a hospitalist during your hospital stay. If you have any questions about your discharge medications or the care  you received while you were in the hospital after you are discharged, you can call the unit and asked to speak with the hospitalist on call if the hospitalist that took care of you is not available. Once you are discharged, your primary care physician will handle any further medical issues. Please note that NO REFILLS for any discharge medications will be authorized once you are discharged, as it is imperative that you return to your primary care physician (or establish a relationship with a primary care physician if you do not have one) for your aftercare needs so that they can reassess your need for medications and monitor your lab values.     Increase activity slowly    Complete by:  As directed             Medication List    STOP taking these medications        sodium chloride 0.9 % SOLN with milrinone 1 MG/ML SOLN 200 mcg/mL      TAKE these medications        amiodarone 200 MG tablet  Commonly known as:  PACERONE  Take 1 tablet (200 mg total) by mouth daily.     feeding supplement Liqd  Take 1 Container by mouth 2 (two) times daily between meals.     LANOXIN 62.5 MCG Tabs  Generic drug:  Digoxin  Take 1 tablet by mouth daily as needed.     magnesium oxide 400 MG tablet  Commonly known as:  MAG-OX  Take 200 mg by mouth daily as needed (leg cramping).     milrinone 20 MG/100 ML Soln infusion  Commonly known as:  PRIMACOR  Inject 11.25 mcg/min into the vein continuous.     oxyCODONE 5 MG immediate release  tablet  Commonly known as:  Oxy IR/ROXICODONE  Take 1 tablet (5 mg total) by mouth every 6 (six) hours as needed for moderate pain or severe pain.     potassium chloride SA 20 MEQ tablet  Commonly known as:  K-DUR,KLOR-CON  Take 1 tablet (20 mEq total) by mouth every other day. Take with torsemide.     spironolactone 25 MG tablet  Commonly known as:  ALDACTONE  Take 0.5 tablets (12.5 mg total) by mouth daily.     torsemide 20 MG tablet  Commonly known as:  DEMADEX  Take 20 mg by mouth daily as needed (for fluid retention).     torsemide 20 MG tablet  Commonly known as:  DEMADEX  Take 1 tablet (20 mg total) by mouth every other day.     warfarin 2 MG tablet  Commonly known as:  COUMADIN  Take 3 tablets (6 mg total) by mouth daily.          Diet and Activity recommendation: See Discharge Instructions above   Consults obtained -  cardiology   Major procedures and Radiology Reports - PLEASE review detailed and final reports for all details, in brief -      Dg Chest 2 View  10/10/2015  CLINICAL DATA:  Syncopal episode and dizziness today. EXAM: CHEST  2 VIEW COMPARISON:  09/09/2015 FINDINGS: Prominent cardiopericardial silhouette consistent with persistent pericardial effusion and cardiac enlargement. The right IJ catheter is stable. The lungs are clear. No pulmonary edema or pleural effusions. The bony thorax is intact. T10 compression fracture stable since March 2017 IMPRESSION: Stable pericardial effusion. No acute pulmonary findings. T10 compression fracture, stable. Electronically Signed   By: Rudie Meyer M.D.   On: 10/10/2015  16:12   Ct Head Wo Contrast  10/10/2015  CLINICAL DATA:  Syncope today.  Possible fall.  Initial encounter. EXAM: CT HEAD WITHOUT CONTRAST TECHNIQUE: Contiguous axial images were obtained from the base of the skull through the vertex without intravenous contrast. COMPARISON:  Head CT scan 04/17/2005. FINDINGS: The brain appears normal without  hemorrhage, infarct, mass lesion, mass effect, midline shift or abnormal extra-axial fluid collection. No hydrocephalus or pneumocephalus. The calvarium is intact. Imaged paranasal sinuses and mastoid air cells are clear. Carotid atherosclerosis is noted. IMPRESSION: No acute abnormality. Atherosclerosis. Electronically Signed   By: Drusilla Kanner M.D.   On: 10/10/2015 16:38   Ir Fluoro Guide Cv Line Right  09/15/2015  INDICATION: CHF EXAM: RIGHT JUGULAR TUNNELED PICC LINE PLACEMENT WITH ULTRASOUND AND FLUOROSCOPIC GUIDANCE MEDICATIONS: Patient was on antibiotics; The antibiotic was administered within an appropriate time interval prior to skin puncture. ANESTHESIA/SEDATION: Versed to mg IV; Fentanyl 50 mcg IV; Moderate Sedation Time:  20 The patient was continuously monitored during the procedure by the interventional radiology nurse under my direct supervision. FLUOROSCOPY TIME:  Fluoroscopy Time: 1 minutes 30 seconds (3 mGy). COMPLICATIONS: None immediate. PROCEDURE: The patient was advised of the possible risks andcomplications and agreed to undergo the procedure. The patient was then brought to the angiographic suite for the procedure. The right neck was prepped with chlorhexidine, draped in the usual sterile fashion using maximum barrier technique (cap and mask, sterile gown, sterile gloves, large sterile sheet, hand hygiene and cutaneous antiseptic). Local anesthesia was attained by infiltration with 1% lidocaine. Ultrasound demonstrated patency of the right jugular vein, and this was documented with an image. Under real-time ultrasound guidance, this vein was accessed with a 21 gauge micropuncture needle and image documentation was performed. The needle was exchanged over a guidewire for a peel-away sheath through which a 20 cm 5 [place] single lumen power injectable tunnel PICC was advanced, and positioned with its tip at the lower SVC/right atrial junction. The cuff was positioned in the subcutaneous  tract. Fluoroscopy during the procedure and fluoro spot radiograph confirms appropriate catheter position. The catheter was flushed, secured to the skin with Prolene sutures, and covered with a sterile dressing. FINDINGS: Tip of the PICC is at the cavoatrial junction. IMPRESSION: Successful placement of a tunneled right jugular PICC with sonographic and fluoroscopic guidance. The catheter is ready for use. Electronically Signed   By: Jolaine Click M.D.   On: 09/15/2015 15:26   Ir US Guide Vasc Access Right  09/15/2015  INDICATION: CHF EXAM: RIGHT JUGULAR TUNNELED PICC LINE PLACEMENT WITH ULTRASOUND AND FLUOROSCOPIC GUIDANCE MEDICATIONS: Patient was on antibiotics; The antibiotic was administered within an appropriate time interval prior to skin puncture. ANESTHESIA/SEDATION: Versed to mg IV; Fentanyl 50 mcg IV; Moderate Sedation Time:  20 The patient was continuously monitored during the procedure by the interventional radiology nurse under my direct supervision. FLUOROSCOPY TIME:  Fluoroscopy Time: 1 minutes 30 seconds (3 mGy). COMPLICATIONS: None immediate. PROCEDURE: The patient was advised of the possible risks andcomplications and agreed to undergo the procedure. The patient was then brought to the angiographic suite for the procedure. The right neck was prepped with chlorhexidine, draped in the usual sterile fashion using maximum barrier technique (cap and mask, sterile gown, sterile gloves, large sterile sheet, hand hygiene and cutaneous antiseptic). Local anesthesia was attained by infiltration with 1% lidocaine. Ultrasound demonstrated patency of the right jugular vein, and this was documented with an image. Under real-time ultrasound guidance, this vein was accessed  with a 21 gauge micropuncture needle and image documentation was performed. The needle was exchanged over a guidewire for a peel-away sheath through which a 20 cm 5 [place] single lumen power injectable tunnel PICC was advanced, and positioned  with its tip at the lower SVC/right atrial junction. The cuff was positioned in the subcutaneous tract. Fluoroscopy during the procedure and fluoro spot radiograph confirms appropriate catheter position. The catheter was flushed, secured to the skin with Prolene sutures, and covered with a sterile dressing. FINDINGS: Tip of the PICC is at the cavoatrial junction. IMPRESSION: Successful placement of a tunneled right jugular PICC with sonographic and fluoroscopic guidance. The catheter is ready for use. Electronically Signed   By: Jolaine Click M.D.   On: 09/15/2015 15:26    Micro Results    No results found for this or any previous visit (from the past 240 hour(s)).     Today   Subjective:   Katrina Peck today has no headache,no chest abdominal pain,no new weakness tingling or numbness, feels much better wants to go home today.   Objective:   Blood pressure 93/65, pulse 93, temperature 97.8 F (36.6 C), temperature source Oral, resp. rate 18, height 5\' 2"  (1.575 m), weight 43.591 kg (96 lb 1.6 oz), SpO2 100 %.   Intake/Output Summary (Last 24 hours) at 10/12/15 1258 Last data filed at 10/12/15 1034  Gross per 24 hour  Intake  859.6 ml  Output   1300 ml  Net -440.4 ml    Exam Awake Alert, Oriented x 3, No new F.N deficits, Normal affect [place].AT,PERRAL Supple Neck, No cervical lymphadenopathy appriciated.  Symmetrical Chest wall movement, Good air movement bilaterally, CTAB RRR,No Gallops,Rubs or new Murmurs, No Parasternal Heave +ve B.Sounds, Abd Soft, Non tender, No organomegaly appriciated, No rebound -guarding or rigidity. No Cyanosis, Clubbing or edema, No new Rash or bruise  Data Review   CBC w Diff:  Lab Results  Component Value Date   WBC 5.6 10/12/2015   WBC 8.2 06/13/2015   WBC 7.6 12/24/2013   HGB 11.5* 10/12/2015   HGB 13.0 12/24/2013   HCT 36.5 10/12/2015   HCT 42.1 12/24/2013   PLT 245 10/12/2015   LYMPHOPCT 18 09/09/2015   MONOPCT 9 09/09/2015   EOSPCT  1 09/09/2015   BASOPCT 0 09/09/2015    CMP:  Lab Results  Component Value Date   NA 138 10/11/2015   K 4.1 10/11/2015   CL 102 10/11/2015   CO2 27 10/11/2015   BUN 27* 10/11/2015   CREATININE 1.03* 10/11/2015   CREATININE 0.64 12/24/2013   PROT 7.8 09/20/2015   ALBUMIN 3.5 09/20/2015   BILITOT 0.6 09/20/2015   ALKPHOS 100 09/20/2015   AST 30 09/20/2015   ALT 25 09/20/2015  .   Total Time in preparing paper work, data evaluation and todays exam - 35 minutes  ELGERGAWY, DAWOOD M.D on 10/12/2015 at 12:58 PM  [facility]   Office  [phone]

## 2015-10-12 NOTE — Discharge Instructions (Signed)
Information on my medicine - Coumadin®   (Warfarin) ° °This medication education was reviewed with me or my healthcare representative as part of my discharge preparation.   °Why was Coumadin prescribed for you? °Coumadin was prescribed for you because you have a blood clot or a medical condition that can cause an increased risk of forming blood clots. Blood clots can cause serious health problems by blocking the flow of blood to the heart, lung, or brain. Coumadin can prevent harmful blood clots from forming. °As a reminder your indication for Coumadin is: History of clot in heart.  °What test will check on my response to Coumadin? °While on Coumadin (warfarin) you will need to have an INR test regularly to ensure that your dose is keeping you in the desired range. The INR (international normalized ratio) number is calculated from the result of the laboratory test called prothrombin time (PT). ° °If an INR APPOINTMENT HAS NOT ALREADY BEEN MADE FOR YOU please schedule an appointment to have this lab work done by your health care provider within 7 days. °Your INR goal is a number between:  2 to 3.  ° °What  do you need to  know  About  COUMADIN? °Take Coumadin (warfarin) exactly as prescribed by your healthcare provider about the same time each day.  DO NOT stop taking without talking to the doctor who prescribed the medication.  Stopping without other blood clot prevention medication to take the place of Coumadin may increase your risk of developing a new clot or stroke.  Get refills before you run out. ° °What do you do if you miss a dose? °If you miss a dose, take it as soon as you remember on the same day then continue your regularly scheduled regimen the next day.  Do not take two doses of Coumadin at the same time. ° °Important Safety Information °A possible side effect of Coumadin (Warfarin) is an increased risk of bleeding. You should call your healthcare provider right away if you experience any of the  following: °? Bleeding from an injury or your nose that does not stop. °? Unusual colored urine (red or dark brown) or unusual colored stools (red or black). °? Unusual bruising for unknown reasons. °? A serious fall or if you hit your head (even if there is no bleeding). ° °Some foods or medicines interact with Coumadin® (warfarin) and might alter your response to warfarin. To help avoid this: °? Eat a balanced diet, maintaining a consistent amount of Vitamin K. °? Notify your provider about major diet changes you plan to make. °? Avoid alcohol or limit your intake to 1 drink for women and 2 drinks for men per day. °(1 drink is 5 oz. wine, 12 oz. beer, or 1.5 oz. liquor.) ° °Make sure that ANY health care provider who prescribes medication for you knows that you are taking Coumadin (warfarin).  Also make sure the healthcare provider who is monitoring your Coumadin knows when you have started a new medication including herbals and non-prescription products. ° °Coumadin® (Warfarin)  Major Drug Interactions  °Increased Warfarin Effect Decreased Warfarin Effect  °Alcohol (large quantities) °Antibiotics (esp. Septra/Bactrim, Flagyl, Cipro) °Amiodarone (Cordarone) °Aspirin (ASA) °Cimetidine (Tagamet) °Megestrol (Megace) °NSAIDs (ibuprofen, naproxen, etc.) °Piroxicam (Feldene) °Propafenone (Rythmol SR) °Propranolol (Inderal) °Isoniazid (INH) °Posaconazole (Noxafil) Barbiturates (Phenobarbital) °Carbamazepine (Tegretol) °Chlordiazepoxide (Librium) °Cholestyramine (Questran) °Griseofulvin °Oral Contraceptives °Rifampin °Sucralfate (Carafate) °Vitamin K  ° °Coumadin® (Warfarin) Major Herbal Interactions  °Increased Warfarin Effect Decreased Warfarin Effect  °Garlic °Ginseng °  Ginkgo biloba Coenzyme Q10 °Green tea °St. John’s wort   ° °Coumadin® (Warfarin) FOOD Interactions  °Eat a consistent number of servings per week of foods HIGH in Vitamin K °(1 serving = ½ cup)  °Collards (cooked, or boiled & drained) °Kale (cooked, or  boiled & drained) °Mustard greens (cooked, or boiled & drained) °Parsley *serving size only = ¼ cup °Spinach (cooked, or boiled & drained) °Swiss chard (cooked, or boiled & drained) °Turnip greens (cooked, or boiled & drained)  °Eat a consistent number of servings per week of foods MEDIUM-HIGH in Vitamin K °(1 serving = 1 cup)  °Asparagus (cooked, or boiled & drained) °Broccoli (cooked, boiled & drained, or raw & chopped) °Brussel sprouts (cooked, or boiled & drained) *serving size only = ½ cup °Lettuce, raw (green leaf, endive, romaine) °Spinach, raw °Turnip greens, raw & chopped  ° °These websites have more information on Coumadin (warfarin):  www.coumadin.com; °www.ahrq.gov/consumer/coumadin.htm; ° ° ° °

## 2015-10-12 NOTE — Progress Notes (Signed)
ANTICOAGULATION CONSULT NOTE  Pharmacy Consult for Warfarin Indication: h/o LV thrombus on ECHO 12/16  No Known Allergies  Patient Measurements: Height:  (157.5 cm) Weight: 96 lb 1.6 oz (43.591 kg) (scale a) IBW/kg (Calculated) : 50.1  Vital Signs: Temp: 98.2 F (36.8 C) (07/19 0653) Temp Source: Oral (07/19 0653) BP: 99/72 mmHg (07/19 0653) Pulse Rate: 82 (07/19 0653)  Labs:  Recent Labs  10/10/15 1526 10/11/15 0352 10/12/15 0510  HGB 12.7 11.8* 11.5*  HCT 39.5 36.9 36.5  PLT 206 235 245  LABPROT 18.0* 17.9* 25.2*  INR 1.48 1.47 2.31*  CREATININE 1.02* 1.03*  --     Estimated Creatinine Clearance: 35.5 mL/min (by C-G formula based on Cr of 1.03).   Medications:  Prescriptions prior to admission  Medication Sig Dispense Refill Last Dose  . Digoxin (LANOXIN) 62.5 MCG TABS Take 1 tablet by mouth daily as needed.   Past Month at Unknown time  . magnesium oxide (MAG-OX) 400 MG tablet Take 200 mg by mouth daily as needed (leg cramping).   Past Month at Unknown time  . milrinone (PRIMACOR) 20 MG/100 ML SOLN infusion Inject 11.25 mcg/min into the vein continuous. 11.25 mL 0 continuous at continuous  . oxyCODONE (OXY IR/ROXICODONE) 5 MG immediate release tablet Take 2 tablets (10 mg total) by mouth every 4 (four) hours as needed for moderate pain or severe pain. 30 tablet 0   . sodium chloride 0.9 % SOLN with milrinone 1 MG/ML SOLN 200 mcg/mL Inject 0.25 mcg/kg/min into the vein continuous. /140 ml Weight 45 kg   Taking  . torsemide (DEMADEX) 20 MG tablet Take 20 mg by mouth daily as needed (for fluid retention).    10/09/2015 at pm  . warfarin (COUMADIN) 2 MG tablet Take 3 tablets (6 mg total) by mouth daily. (Patient taking differently: Take 6-8 mg by mouth See admin instructions. 6 mg on Sun/Tues/Wed/Fri and 8 mg on Mon/Thurs/Sat) 80 tablet 2 10/09/2015 at pm  . [DISCONTINUED] amiodarone (PACERONE) 100 MG tablet Take 1 tablet (100 mg total) by mouth daily. 30 tablet  5 Past Month at Unknown time  . [DISCONTINUED] potassium chloride SA (K-DUR,KLOR-CON) 20 MEQ tablet Take 20 mEq by mouth daily.   Past Month at Unknown time  . [DISCONTINUED] spironolactone (ALDACTONE) 25 MG tablet Take 0.5 tablets (12.5 mg total) by mouth daily. (Patient taking differently: Take 12.5 mg by mouth daily as needed. ) 30 tablet 3 Past Month at Unknown time   Scheduled:  . amiodarone  200 mg Oral Daily  . feeding supplement  1 Container Oral BID BM  . sodium chloride flush  3 mL Intravenous Q12H  . sodium chloride flush  3 mL Intravenous Q12H  . spironolactone  12.5 mg Oral Daily  . torsemide  20 mg Oral QODAY  . warfarin  6 mg Oral Once per day on Sun Tue Wed Fri  . [START ON 10/13/2015] warfarin  8 mg Oral Once per day on Mon Thu Sat  . Warfarin - Pharmacist Dosing Inpatient   Does not apply q1800   Infusions:  . milrinone 0.25 mcg/kg/min (10/11/15 1351)    Assessment: 70yo female with history of NICM on milrinone ans HF medication nonadherence presents with a syncopal episode. Pharmacy is consulted to dose wafarin for history of LV thrombus. Admit INR on 7/17 was subtherapeutic at 1.48 whicj is consistent with outpatient INR 1.2-1.7 with doses increased as needed.  INR 1.47>2.3 after 2 doses warfarin .  Will back down  dose slightly to home dose now therapeutic.  - Some concern for warfarin non-compliance as well 7  PTA Warfarin Dose: 8mg  Mon/Thur/Sat and 6mg  AODs with last outpatient dose 10/09/15  Goal of Therapy:  INR 2-3 Monitor platelets by anticoagulation protocol: Yes   Plan:  Warfarin 8mg  MThSat, 6mg  AOD - home dose Daily INR/CBC Monitor s/sx of bleeding  Leota Sauers Pharm.D. CPP, BCPS Clinical Pharmacist [phone] 10/12/2015 10:01 AM

## 2015-10-12 NOTE — Care Management Important Message (Signed)
Important Message  Patient Details  Name: Katrina Peck MRN: [id number] Date of Birth: [birth year]   Medicare Important Message Given:  Yes    Bernadette Hoit 10/12/2015, 10:03 AM

## 2015-10-12 NOTE — Progress Notes (Addendum)
Pt has orders to be discharged. Discharge instructions given and pt has no additional questions at this time. Medication regimen reviewed and pt educated. Pt verbalized understanding and has no additional questions. Telemetry box removed. IV removed and site in good condition. [facility] to come put pt on home milrinone drip. Pt stable and waiting for transportation.  1520 offered pt wheelchair escort downstairs however pt declines and wishes to walk down to car herself when discharged. Pt AOx4 and steady on feet.   Jilda Panda RN

## 2015-10-14 DIAGNOSIS — I240 Acute coronary thrombosis not resulting in myocardial infarction: Secondary | ICD-10-CM | POA: Diagnosis not present

## 2015-10-14 DIAGNOSIS — R7881 Bacteremia: Secondary | ICD-10-CM | POA: Diagnosis not present

## 2015-10-14 DIAGNOSIS — I313 Pericardial effusion (noninflammatory): Secondary | ICD-10-CM | POA: Diagnosis not present

## 2015-10-14 DIAGNOSIS — I5032 Chronic diastolic (congestive) heart failure: Secondary | ICD-10-CM | POA: Diagnosis not present

## 2015-10-14 DIAGNOSIS — B952 Enterococcus as the cause of diseases classified elsewhere: Secondary | ICD-10-CM | POA: Diagnosis not present

## 2015-10-14 DIAGNOSIS — I429 Cardiomyopathy, unspecified: Secondary | ICD-10-CM | POA: Diagnosis not present

## 2015-10-17 ENCOUNTER — Ambulatory Visit: Payer: Self-pay

## 2015-10-17 ENCOUNTER — Ambulatory Visit (INDEPENDENT_AMBULATORY_CARE_PROVIDER_SITE_OTHER): Payer: Medicare Other

## 2015-10-17 ENCOUNTER — Encounter (HOSPITAL_COMMUNITY): Payer: Self-pay | Admitting: *Deleted

## 2015-10-17 ENCOUNTER — Telehealth: Payer: Self-pay | Admitting: Internal Medicine

## 2015-10-17 DIAGNOSIS — I513 Intracardiac thrombosis, not elsewhere classified: Secondary | ICD-10-CM

## 2015-10-17 DIAGNOSIS — T80219A Unspecified infection due to central venous catheter, initial encounter: Secondary | ICD-10-CM | POA: Diagnosis not present

## 2015-10-17 DIAGNOSIS — I5022 Chronic systolic (congestive) heart failure: Secondary | ICD-10-CM | POA: Diagnosis not present

## 2015-10-17 DIAGNOSIS — R7881 Bacteremia: Secondary | ICD-10-CM | POA: Diagnosis not present

## 2015-10-17 DIAGNOSIS — B952 Enterococcus as the cause of diseases classified elsewhere: Secondary | ICD-10-CM | POA: Diagnosis not present

## 2015-10-17 DIAGNOSIS — Z7901 Long term (current) use of anticoagulants: Secondary | ICD-10-CM | POA: Insufficient documentation

## 2015-10-17 DIAGNOSIS — I240 Acute coronary thrombosis not resulting in myocardial infarction: Secondary | ICD-10-CM | POA: Diagnosis not present

## 2015-10-17 DIAGNOSIS — Z79899 Other long term (current) drug therapy: Secondary | ICD-10-CM | POA: Insufficient documentation

## 2015-10-17 DIAGNOSIS — I110 Hypertensive heart disease with heart failure: Secondary | ICD-10-CM | POA: Diagnosis not present

## 2015-10-17 DIAGNOSIS — I5032 Chronic diastolic (congestive) heart failure: Secondary | ICD-10-CM | POA: Diagnosis not present

## 2015-10-17 DIAGNOSIS — Y652 Failure in suture or ligature during surgical operation: Secondary | ICD-10-CM | POA: Insufficient documentation

## 2015-10-17 DIAGNOSIS — I313 Pericardial effusion (noninflammatory): Secondary | ICD-10-CM | POA: Diagnosis not present

## 2015-10-17 DIAGNOSIS — I213 ST elevation (STEMI) myocardial infarction of unspecified site: Secondary | ICD-10-CM

## 2015-10-17 DIAGNOSIS — I429 Cardiomyopathy, unspecified: Secondary | ICD-10-CM | POA: Diagnosis not present

## 2015-10-17 DIAGNOSIS — I480 Paroxysmal atrial fibrillation: Secondary | ICD-10-CM

## 2015-10-17 DIAGNOSIS — Z452 Encounter for adjustment and management of vascular access device: Secondary | ICD-10-CM | POA: Diagnosis not present

## 2015-10-17 LAB — POCT INR: INR: 3.1

## 2015-10-17 NOTE — Telephone Encounter (Signed)
[facility]  I received a call from the patient's husband that her PICC for Milrinone was not flushing & that he was worried about it functioning properly.  He had reached out to the home health agency but was unsatisfied with their response.  He will bring her into the ED for evaluation of the PICC.  Lance Morin, MD

## 2015-10-17 NOTE — ED Triage Notes (Signed)
Pt had picc line check by home health nurse today, unable to flush line. Pt wants picc line checked. No complaints at this time.

## 2015-10-18 ENCOUNTER — Emergency Department (HOSPITAL_COMMUNITY)
Admission: EM | Admit: 2015-10-18 | Discharge: 2015-10-18 | Disposition: A | Discharge status: Home / Self Care | Payer: Medicare Other | Attending: Emergency Medicine | Admitting: Emergency Medicine

## 2015-10-18 DIAGNOSIS — T80219A Unspecified infection due to central venous catheter, initial encounter: Secondary | ICD-10-CM | POA: Diagnosis not present

## 2015-10-18 DIAGNOSIS — Z452 Encounter for adjustment and management of vascular access device: Secondary | ICD-10-CM

## 2015-10-18 MED ORDER — SODIUM CHLORIDE 0.9% FLUSH
10.0000 mL | INTRAVENOUS | Status: DC | PRN
  Administered 2015-10-18: 20 mL

## 2015-10-18 MED ORDER — HEPARIN SOD (PORK) LOCK FLUSH 100 UNIT/ML IV SOLN
250.0000 [IU] | INTRAVENOUS | Status: AC | PRN
  Administered 2015-10-18: 250 [IU]

## 2015-10-18 NOTE — ED Provider Notes (Signed)
[facility]-EMERGENCY DEPT Provider Note   CSN: [id number] Arrival date & time: 10/17/15  2228  First Provider Contact:  First MD Initiated Contact with Patient 10/18/15 [phone]        History   Chief Complaint Chief Complaint  Patient presents with  . Vascular Access Problem    HPI Katrina Peck is a 70 y.o. female.  The history is provided by the patient.  She had a PICC line inserted on July 24 for milrinone infusion. There were difficulties flushing the line at home and she came in to have the line checked. She has no other complaints.  Past Medical History:  Diagnosis Date  . Anemia   . Bacteremia    a. MSSA bacteremia and discitis in 3/17, suspect tunneled catheter infection.   . Blood transfusion without reported diagnosis   . Chronic systolic CHF (congestive heart failure) (HCC)    a. 06/2014 Echo: EF 20%.  . Discitis   . Hypertension   . LV (left ventricular) mural thrombus (HCC)    a. Noted on 12/16 echo.   Marland Kitchen NICM (nonischemic cardiomyopathy) (HCC)    a. 06/2014 Echo: EF 20%, diff HK, mild MR, mildly dil LA/RA, mildly reduced RV fxn;  b. 06/2014 Cath: nl cors.  . Noncompliance   . PAF (paroxysmal atrial fibrillation) (HCC)    a. with RVR, converted to NSR with amiodarone.  . Pericardial effusion    a. 06/2014 Large effusion, no tamponade, s/p window for diag/therap - neg malig. b. 6/16 - recurrence w/o tamponade, subsequently folloed by echoes. Mod 12/16.  Marland Kitchen Syncope 09/2015    Patient Active Problem List   Diagnosis Date Noted  . Syncope 10/10/2015  . Fever, unspecified   . Gram-negative infection   . PICC line infection   . SOB (shortness of breath)   . Klebsiella pneumoniae sepsis (HCC) 06/25/2015  . Pulmonary hypertension (HCC) 06/25/2015  . Lung nodule   . Discitis of thoracic region   . Malnutrition of moderate degree 06/07/2015  . Leukocytosis   . Acute blood loss anemia   . Bacteremia   . MSSA (methicillin susceptible Staphylococcus aureus) septicemia  (HCC) 06/05/2015  . Atrial fibrillation (HCC) [I48.91] 03/31/2015  . Long term (current) use of anticoagulants [Z79.01] 03/31/2015  . Non compliance w medication regimen 03/28/2015  . Protein-calorie malnutrition, severe (HCC) 03/28/2015  . LV (left ventricular) mural thrombus (HCC)   . Paroxysmal atrial fibrillation (HCC) 09/29/2014  . Chronic systolic heart failure (HCC) 08/07/2014  . NICM (nonischemic cardiomyopathy) (HCC) 07/24/2014  . 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 Pericardial effusion 07/24/2014    Past Surgical History:  Procedure Laterality Date  . CARDIAC CATHETERIZATION N/A 02/07/2015   Procedure: Right Heart Cath;  Surgeon: Laurey Morale, MD;  Location: [facility] INVASIVE CV LAB;  Service: Cardiovascular;  Laterality: N/A;  . LEFT AND RIGHT HEART CATHETERIZATION WITH CORONARY ANGIOGRAM N/A 07/23/2014   Procedure: LEFT AND RIGHT HEART CATHETERIZATION WITH CORONARY ANGIOGRAM;  Surgeon: Laurey Morale, MD;  Location: [facility] CATH LAB;  Service: Cardiovascular;  Laterality: N/A;  . PERIPHERALLY INSERTED CENTRAL CATHETER INSERTION  10/10/2015  . PLEURAL EFFUSION DRAINAGE N/A 07/26/2014   Procedure: DRAINAGE OF PERICARDIAL EFFUSION;  Surgeon: Delight Ovens, MD;  Location: [facility] OR;  Service: Thoracic;  Laterality: N/A;  . RADIOLOGY WITH ANESTHESIA N/A 06/08/2015   Procedure: MRI CERVICAL SPINE WITH/WITHOUT LUMBAR WITH/WITHOUT THORACIC WITH/WITHOUT       (RADIOLOGY WITH ANESTHESIA);  Surgeon: Medication  Radiologist, MD;  Location: [facility] OR;  Service: Radiology;  Laterality: N/A;  . SUBXYPHOID PERICARDIAL WINDOW N/A 07/26/2014   Procedure: SUBXYPHOID PERICARDIAL WINDOW;  Surgeon: Delight Ovens, MD;  Location[facility] OR;  Service: Thoracic;  Laterality: N/A;  . TEE WITHOUT CARDIOVERSION N/A 07/26/2014   Procedure: TRANSESOPHAGEAL ECHOCARDIOGRAM (TEE);  Surgeon: Delight Ovens, MD;  Location: [facility] OR;  Service: Thoracic;  Laterality: N/A;  . TEE WITHOUT  CARDIOVERSION N/A 06/06/2015   Procedure: TRANSESOPHAGEAL ECHOCARDIOGRAM (TEE);  Surgeon: Dolores Patty, MD;  Location: [facility] ENDOSCOPY;  Service: Cardiovascular;  Laterality: N/A;    OB History    No data available       Home Medications    Prior to Admission medications   Medication Sig Start Date End Date Taking? Authorizing Provider  amiodarone (PACERONE) 200 MG tablet Take 1 tablet (200 mg total) by mouth daily. 10/12/15   Graciella Freer, PA-C  Digoxin (LANOXIN) 62.5 MCG TABS Take 1 tablet by mouth daily as needed.    Historical Provider, MD  feeding supplement (BOOST / RESOURCE BREEZE) LIQD Take 1 Container by mouth 2 (two) times daily between meals. 10/12/15   Leana Roe Elgergawy, MD  magnesium oxide (MAG-OX) 400 MG tablet Take 200 mg by mouth daily as needed (leg cramping).    Historical Provider, MD  milrinone (PRIMACOR) 20 MG/100 ML SOLN infusion Inject 11.25 mcg/min into the vein continuous. 09/12/15   Alison Murray, MD  oxyCODONE (OXY IR/ROXICODONE) 5 MG immediate release tablet Take 1 tablet (5 mg total) by mouth every 6 (six) hours as needed for moderate pain or severe pain. 10/12/15   Leana Roe Elgergawy, MD  potassium chloride SA (K-DUR,KLOR-CON) 20 MEQ tablet Take 1 tablet (20 mEq total) by mouth every other day. Take with torsemide. 10/12/15   Graciella Freer, PA-C  spironolactone (ALDACTONE) 25 MG tablet Take 0.5 tablets (12.5 mg total) by mouth daily. 10/12/15   Graciella Freer, PA-C  torsemide (DEMADEX) 20 MG tablet Take 20 mg by mouth daily as needed (for fluid retention).     Historical Provider, MD  torsemide (DEMADEX) 20 MG tablet Take 1 tablet (20 mg total) by mouth every other day. 10/12/15   Graciella Freer, PA-C  warfarin (COUMADIN) 2 MG tablet Take 3 tablets (6 mg total) by mouth daily. Patient taking differently: Take 6-8 mg by mouth See admin instructions. 6 mg on Sun/Tues/Wed/Fri and 8 mg on Mon/Thurs/Sat 09/16/15   Alison Murray, MD     Family History Family History  Problem Relation Age of Onset  . Hypertension Sister   . Diabetes Maternal Grandmother   . Cancer Mother     died young (pt was only in [employer] @ the time)  . Other Father     died in his 60's.    Social History Social History  Substance Use Topics  . Smoking status: Never Smoker  . Smokeless tobacco: Never Used  . Alcohol use No     Allergies   Review of patient's allergies indicates no known allergies.   Review of Systems Review of Systems  All other systems reviewed and are negative.    Physical Exam Updated Vital Signs BP 125/85 (BP Location: Right Arm)   Pulse 79   Temp 98 F (36.7 C) (Oral)   Resp 16   Ht 5\' 2"  (1.575 m)   Wt 98 lb (44.5 kg)   SpO2 100%   BMI 17.92 kg/m   Physical Exam  Nursing note and vitals reviewed.  70 year old female, resting comfortably and in no acute distress. Vital signs are normal. Oxygen saturation is 100%, which is normal. Head is normocephalic and atraumatic. PERRLA, EOMI. Oropharynx is clear. Neck is nontender and supple without adenopathy or JVD. Back is nontender and there is no CVA tenderness. Lungs are clear without rales, wheezes, or rhonchi. Chest is nontender. PICC line is present in the right subclavian area. Heart has regular rate and rhythm without murmur. Abdomen is soft, flat, nontender without masses or hepatosplenomegaly and peristalsis is normoactive. Extremities have no cyanosis or edema, full range of motion is present. Skin is warm and dry without rash. Neurologic: Mental status is normal, cranial nerves are intact, there are no motor or sensory deficits.  ED Treatments / Results   Procedures Procedures   Medications Ordered in ED Medications  sodium chloride flush (NS) 0.9 % injection 10-40 mL (20 mLs Intracatheter Given 10/18/15 0340)  heparin lock flush 100 unit/mL (250 Units Intracatheter Given 10/18/15 0340)     Initial Impression / Assessment and  Plan / ED Course  I have reviewed the triage vital signs and the nursing notes.  Pertinent labs & imaging results that were available during my care of the patient were reviewed by me and considered in my medical decision making (see chart for details).  Clinical Course  70 year old female presented with possible problem with PICC line. Old records reviewed showing recent hospitalization for syncope and known diagnosis of congestive heart failure. IV team as assessed her PICC line and deemed that it is functioning normally and milrinone infusion is restarted.  Final Clinical Impressions(s) / ED Diagnoses   Final diagnoses:  PICC (peripherally inserted central catheter) in place    New Prescriptions New Prescriptions   No medications on file     Dione Booze, MD 10/18/15 [phone]

## 2015-10-18 NOTE — ED Notes (Signed)
Pt requesting to be discharged. Dr.Glick aware

## 2015-10-18 NOTE — ED Notes (Signed)
IV team at bedside 

## 2015-10-18 NOTE — Discharge Instructions (Signed)
Continue your infusion through the PICC line.

## 2015-10-20 ENCOUNTER — Other Ambulatory Visit: Payer: Self-pay

## 2015-10-20 DIAGNOSIS — I429 Cardiomyopathy, unspecified: Secondary | ICD-10-CM | POA: Diagnosis not present

## 2015-10-20 DIAGNOSIS — I240 Acute coronary thrombosis not resulting in myocardial infarction: Secondary | ICD-10-CM | POA: Diagnosis not present

## 2015-10-20 DIAGNOSIS — I313 Pericardial effusion (noninflammatory): Secondary | ICD-10-CM | POA: Diagnosis not present

## 2015-10-20 DIAGNOSIS — R7881 Bacteremia: Secondary | ICD-10-CM | POA: Diagnosis not present

## 2015-10-20 DIAGNOSIS — B952 Enterococcus as the cause of diseases classified elsewhere: Secondary | ICD-10-CM | POA: Diagnosis not present

## 2015-10-20 DIAGNOSIS — I5032 Chronic diastolic (congestive) heart failure: Secondary | ICD-10-CM | POA: Diagnosis not present

## 2015-10-20 NOTE — Patient Outreach (Signed)
[facility]) Care Management  10/20/2015  Katrina Peck 1945-06-29 [id number]   Telephone call to patient for follow up.  Unable to reach patient. HIPAA compliant voice message left with call back phone number.   PLAN; RNCM will attempt 2nd telephone call to patient within 1 week.  George Ina RN,BSN,CCM [facility] Telephonic  [phone]

## 2015-10-21 ENCOUNTER — Other Ambulatory Visit: Payer: Self-pay

## 2015-10-21 ENCOUNTER — Ambulatory Visit: Payer: Self-pay

## 2015-10-21 DIAGNOSIS — I5022 Chronic systolic (congestive) heart failure: Secondary | ICD-10-CM

## 2015-10-21 NOTE — Patient Outreach (Signed)
[facility]) Care Management  10/21/2015  Katrina Peck 03-Dec-1945 [id number]   POST HOSPITAL DISCHARGE:  10/10/15 TO 10/12/15 Patient followed by [facility] heart failure program prior to admission Patient followed up to engage in transition of care program.   SUBJECTIVE:  Telephone call to patient. HIPAA verified.  Patient states she is doing, "pretty good." Patient states she still has some lightheadedness.  Patient states she has some pain and numbness in her right hand and both thighs. Patient states she developed the pain and numbness in her right hand last year because she had the IV milrinone in her right arm.  Patient states her doctors are aware of this.  Patient states this is something she developed because of the medicine being in that arm.  Patient states she has some numbness in her thighs but states it is not as bad as it was. Patient states it is mild. Patient denies any unusual shortness of breath and or swelling. Patient states her home health nurse continues to see her 2 times per week. Patient states her home health nurse measure her for swelling at each visit. .  Patient states she has not had a follow up appointment with her primary MD since discharge from the hospital.  Patient states she has a follow up scheduled with her cardiologist on 10/27/15. Patient states she is scheduled with a nurse practitioner student at the cardiologist office for a heart failure class. Patient state she is a little confused about the exact date. Patient states she received a call today from her cardiologist office rescheduling her appointment for 10/27/15.  Patient concerned that heart failure class may have been moved to another date.  Patient denies any falls since syncope episode on 10/10/15.  Patient states she still tries to be careful when she is walking due to feeling a little lightheaded.  RNCM discussed with patient transition of care program with [facility] care management.  Patient verbally  agreed to program and request calls only no home visit.   ASSESSMENT; Per patient record: 70 y.o. female with medical history significant for nonischemic cardiomyopathy with EF 10% on chronic milrinone infusion, paroxysmal atrial fibrillation and nonadherence to treatment plan. Patient admitted to hospital on 10/10/15 due to syncope episode Per discharge note: Syncope In a patient with paroxysmal atrial fibrillation and medical nonadherence. Could have been arrhythmic as she had stopped her amiodarone  PLAN: RNCM will refer patient to community case manager for transition of care.  George Ina RN,BSN,CCM [facility] Telephonic  [phone]

## 2015-10-24 ENCOUNTER — Other Ambulatory Visit (HOSPITAL_COMMUNITY): Payer: Self-pay | Admitting: Cardiology

## 2015-10-24 ENCOUNTER — Ambulatory Visit (INDEPENDENT_AMBULATORY_CARE_PROVIDER_SITE_OTHER): Payer: Medicare Other | Admitting: Internal Medicine

## 2015-10-24 DIAGNOSIS — B952 Enterococcus as the cause of diseases classified elsewhere: Secondary | ICD-10-CM | POA: Diagnosis not present

## 2015-10-24 DIAGNOSIS — I240 Acute coronary thrombosis not resulting in myocardial infarction: Secondary | ICD-10-CM | POA: Diagnosis not present

## 2015-10-24 DIAGNOSIS — I480 Paroxysmal atrial fibrillation: Secondary | ICD-10-CM

## 2015-10-24 DIAGNOSIS — Z7901 Long term (current) use of anticoagulants: Secondary | ICD-10-CM

## 2015-10-24 DIAGNOSIS — I513 Intracardiac thrombosis, not elsewhere classified: Secondary | ICD-10-CM

## 2015-10-24 DIAGNOSIS — I213 ST elevation (STEMI) myocardial infarction of unspecified site: Secondary | ICD-10-CM

## 2015-10-24 DIAGNOSIS — I313 Pericardial effusion (noninflammatory): Secondary | ICD-10-CM | POA: Diagnosis not present

## 2015-10-24 DIAGNOSIS — I429 Cardiomyopathy, unspecified: Secondary | ICD-10-CM | POA: Diagnosis not present

## 2015-10-24 DIAGNOSIS — I5032 Chronic diastolic (congestive) heart failure: Secondary | ICD-10-CM | POA: Diagnosis not present

## 2015-10-24 DIAGNOSIS — R7881 Bacteremia: Secondary | ICD-10-CM | POA: Diagnosis not present

## 2015-10-24 LAB — POCT INR: INR: 1.2

## 2015-10-27 ENCOUNTER — Encounter (HOSPITAL_COMMUNITY): Payer: Medicare Other

## 2015-10-28 ENCOUNTER — Other Ambulatory Visit: Payer: Self-pay

## 2015-10-28 NOTE — Patient Outreach (Signed)
[facility]) Care Management  10/28/15  Katrina Peck December 01, 1945 [id number]  Successful outreach completed with patient. Patient declines call today, but agreeable to call next week. Patient had a lot of questions about [facility] care management services and RNCM provided information to answer her questions. She is agreeable to services, but declines home visits.   Plan: Will complete assessment next week.  Turkey R. Lanaiya Lantry, RN, BSN, CCM [facility] Care Management Coordinator [phone]

## 2015-10-31 ENCOUNTER — Ambulatory Visit (INDEPENDENT_AMBULATORY_CARE_PROVIDER_SITE_OTHER): Payer: Medicare Other | Admitting: Cardiology

## 2015-10-31 DIAGNOSIS — Z7901 Long term (current) use of anticoagulants: Secondary | ICD-10-CM

## 2015-10-31 DIAGNOSIS — I480 Paroxysmal atrial fibrillation: Secondary | ICD-10-CM

## 2015-10-31 DIAGNOSIS — I5032 Chronic diastolic (congestive) heart failure: Secondary | ICD-10-CM | POA: Diagnosis not present

## 2015-10-31 DIAGNOSIS — I213 ST elevation (STEMI) myocardial infarction of unspecified site: Secondary | ICD-10-CM

## 2015-10-31 DIAGNOSIS — I313 Pericardial effusion (noninflammatory): Secondary | ICD-10-CM | POA: Diagnosis not present

## 2015-10-31 DIAGNOSIS — B952 Enterococcus as the cause of diseases classified elsewhere: Secondary | ICD-10-CM | POA: Diagnosis not present

## 2015-10-31 DIAGNOSIS — I240 Acute coronary thrombosis not resulting in myocardial infarction: Secondary | ICD-10-CM | POA: Diagnosis not present

## 2015-10-31 DIAGNOSIS — I429 Cardiomyopathy, unspecified: Secondary | ICD-10-CM | POA: Diagnosis not present

## 2015-10-31 DIAGNOSIS — R7881 Bacteremia: Secondary | ICD-10-CM | POA: Diagnosis not present

## 2015-10-31 DIAGNOSIS — I513 Intracardiac thrombosis, not elsewhere classified: Secondary | ICD-10-CM

## 2015-10-31 LAB — POCT INR: INR: 3.2

## 2015-11-02 ENCOUNTER — Other Ambulatory Visit: Payer: Self-pay

## 2015-11-02 NOTE — Patient Outreach (Signed)
[facility]) Care Management  11/02/15  Katrina Peck March 25, 1946 [id number]  Attempted outreach to patient without success. Left HIPAA compliant voicemail with RNCM contact information and invited callback.  Turkey R. Cordia Miklos, RN, BSN, CCM [facility] Care Management Coordinator [phone]

## 2015-11-03 DIAGNOSIS — I5032 Chronic diastolic (congestive) heart failure: Secondary | ICD-10-CM | POA: Diagnosis not present

## 2015-11-03 DIAGNOSIS — I240 Acute coronary thrombosis not resulting in myocardial infarction: Secondary | ICD-10-CM | POA: Diagnosis not present

## 2015-11-03 DIAGNOSIS — R7881 Bacteremia: Secondary | ICD-10-CM | POA: Diagnosis not present

## 2015-11-03 DIAGNOSIS — I313 Pericardial effusion (noninflammatory): Secondary | ICD-10-CM | POA: Diagnosis not present

## 2015-11-03 DIAGNOSIS — B952 Enterococcus as the cause of diseases classified elsewhere: Secondary | ICD-10-CM | POA: Diagnosis not present

## 2015-11-03 DIAGNOSIS — I429 Cardiomyopathy, unspecified: Secondary | ICD-10-CM | POA: Diagnosis not present

## 2015-11-07 ENCOUNTER — Ambulatory Visit (INDEPENDENT_AMBULATORY_CARE_PROVIDER_SITE_OTHER): Payer: Medicare Other | Admitting: Pharmacist

## 2015-11-07 DIAGNOSIS — I213 ST elevation (STEMI) myocardial infarction of unspecified site: Secondary | ICD-10-CM

## 2015-11-07 DIAGNOSIS — I429 Cardiomyopathy, unspecified: Secondary | ICD-10-CM | POA: Diagnosis not present

## 2015-11-07 DIAGNOSIS — I313 Pericardial effusion (noninflammatory): Secondary | ICD-10-CM | POA: Diagnosis not present

## 2015-11-07 DIAGNOSIS — I513 Intracardiac thrombosis, not elsewhere classified: Secondary | ICD-10-CM

## 2015-11-07 DIAGNOSIS — R7881 Bacteremia: Secondary | ICD-10-CM | POA: Diagnosis not present

## 2015-11-07 DIAGNOSIS — Z7901 Long term (current) use of anticoagulants: Secondary | ICD-10-CM

## 2015-11-07 DIAGNOSIS — I480 Paroxysmal atrial fibrillation: Secondary | ICD-10-CM

## 2015-11-07 DIAGNOSIS — I5032 Chronic diastolic (congestive) heart failure: Secondary | ICD-10-CM | POA: Diagnosis not present

## 2015-11-07 DIAGNOSIS — B952 Enterococcus as the cause of diseases classified elsewhere: Secondary | ICD-10-CM | POA: Diagnosis not present

## 2015-11-07 DIAGNOSIS — I240 Acute coronary thrombosis not resulting in myocardial infarction: Secondary | ICD-10-CM | POA: Diagnosis not present

## 2015-11-07 LAB — POCT INR: INR: 1.5

## 2015-11-09 ENCOUNTER — Ambulatory Visit (HOSPITAL_COMMUNITY)
Admission: RE | Admit: 2015-11-09 | Discharge: 2015-11-09 | Disposition: A | Discharge status: Home / Self Care | Payer: Medicare Other | Source: Ambulatory Visit | Attending: Cardiology | Admitting: Cardiology

## 2015-11-09 VITALS — BP 112/72 | HR 90 | Wt 101.0 lb

## 2015-11-09 DIAGNOSIS — I110 Hypertensive heart disease with heart failure: Secondary | ICD-10-CM | POA: Insufficient documentation

## 2015-11-09 DIAGNOSIS — R55 Syncope and collapse: Secondary | ICD-10-CM | POA: Diagnosis not present

## 2015-11-09 DIAGNOSIS — Z7901 Long term (current) use of anticoagulants: Secondary | ICD-10-CM | POA: Insufficient documentation

## 2015-11-09 DIAGNOSIS — Z79899 Other long term (current) drug therapy: Secondary | ICD-10-CM | POA: Diagnosis not present

## 2015-11-09 DIAGNOSIS — I480 Paroxysmal atrial fibrillation: Secondary | ICD-10-CM

## 2015-11-09 DIAGNOSIS — Z86718 Personal history of other venous thrombosis and embolism: Secondary | ICD-10-CM | POA: Insufficient documentation

## 2015-11-09 DIAGNOSIS — I5022 Chronic systolic (congestive) heart failure: Secondary | ICD-10-CM | POA: Diagnosis not present

## 2015-11-09 DIAGNOSIS — I428 Other cardiomyopathies: Secondary | ICD-10-CM | POA: Diagnosis not present

## 2015-11-09 LAB — COMPREHENSIVE METABOLIC PANEL
ALBUMIN: 3.6 g/dL (ref 3.5–5.0)
ALK PHOS: 81 U/L (ref 38–126)
ALT: 23 U/L (ref 14–54)
ANION GAP: 8 (ref 5–15)
AST: 29 U/L (ref 15–41)
BUN: 14 mg/dL (ref 6–20)
CALCIUM: 10 mg/dL (ref 8.9–10.3)
CHLORIDE: 105 mmol/L (ref 101–111)
CO2: 26 mmol/L (ref 22–32)
Creatinine, Ser: 0.89 mg/dL (ref 0.44–1.00)
GFR calc Af Amer: >60 mL/min (ref >60)
GFR calc non Af Amer: >60 mL/min (ref >60)
GLUCOSE: 106 mg/dL — AB (ref 65–99)
Potassium: 4.7 mmol/L (ref 3.5–5.1)
SODIUM: 139 mmol/L (ref 135–145)
Total Bilirubin: 1 mg/dL (ref 0.3–1.2)
Total Protein: 7.7 g/dL (ref 6.5–8.1)

## 2015-11-09 LAB — TSH: TSH: 2.074 u[IU]/mL (ref 0.350–4.500)

## 2015-11-09 NOTE — Patient Instructions (Signed)
Routine lab work today. Will notify you of abnormal results  Please Take Spironolactone and Amiodarone DAILY.  Follow up with Dr.McLean in 6 weeks.

## 2015-11-10 NOTE — Progress Notes (Signed)
Patient ID: Katrina Peck, female   DOB: 11-Sep-1945, 70 y.o.   MRN: [id number]    Advanced Heart Failure Clinic Note   PCP: Dr. Duanne Guess Cardiology: Dr. Shirlee Latch  70 y/o female with history of HTN who was admitted 07/20/14 with 3 months of exertional dyspnea, weight gain, cough, and orthopnea. Found to have EF 20% and a large pericardial effusion. Cath 5/16 no CAD. Hospital course complicated by atrial fibrillation w/ RVR, converted to NSR on amiodarone. Underwent pericardial window (transudate).    She had a repeat echo in 6/16 that showed persistently low LV EF at 15-20% with recurrence of moderate to large pericardial effusion without tamponade. This showed EF 10-15%, mild LV dilation, restrictive diastolic function, moderate to severely decreased RV function, and a moderate to large pericardial effusion similar to 6/16.   Admitted in 11/16 with marked volume overload. Diuresed with IV lasix. Discharged on milrinone 0.125 mcg. BB and ACE stopped. Discharge weight was 92 pounds.   She had problems with her PICC line (numbness/pain in right arm/hand) and wanted the PICC line out.  She did not want placement of tunneled catheter.  Therefore, PICC was removed and milrinone stopped.  She re-developed symptomatic low output CHF and was re-admitted in 12/16.  She then had tunneled catheter placed and was restarted on milrinone and was diuresed.  She was discharged home back on milrinone.  Echo in 12/16 showed EF 20% with LV thrombus.  She was put on coumadin.   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 Tunneled cath was removed and milrinone given through peripheral IV for several days. She completed a course of ceftriaxone x 6 weeks.  Tunneled cath replaced with repeat cultures negative for home inotropic support.    Admission 6/17 with tunneled catheter infection, cultures grew Enterobacter cloacae.   Catheter was replaced to allow ongoing home milrinone.    She was admitted in 7/17 with a syncopal episode. No prodrome. No clear diagnosis but she had stopped her amiodarone so I am concerned that it was VT/VF.  She has refused ICD.    She returns for HF follow up.  I think that she is taking amiodarone regularly, but it is hard to pen her down on it.  She is doing pretty well.  Minimal exertional dyspnea, able to walk into office without problems.  No further lightheadednes or syncope. No orthopnea, PND, palpitations.  No BRBPR or melena on warfarin.    Labs  5/16: K 4.6, creatinine 1.0 => 0.81, TSH normal, LFTs normal, TSH normal, BNP 1268 7/16: K 5.3 => 5.4, creatinine 1.0 => 1.02, HCT 39.7, BNP > 4500 => 1082, SPEP negative, urine IFE negative 8/16: K 5, creatinine 0.83 11/30/14: K 4.2 Creatinine 0.79  02/09/2015: K 4.5 Creatinine 1.11 12/16: digoxin < 0.2 1/17: K 3.8, creatinine 0.96, HCT 34.9 3/17: K 3.8, creatinine 0.77 6/17: K 4.1, creatinine 0.99, HCT 30.4 7/17: K 4.1, creatinine 1.03  SH: Married, no smoking, no ETOH.   FH: No history of cardiomyopathy or sudden death.    ROS: All systems reviewed and negative except as per HPI.   PMH: 1. Chronic systolic CHF: Nonischemic cardiomyopathy.  LHC/RHC (4/16) with no significant coronary disease; mean RA 5, PA 28/11, mean PCWP 9, CI 3.39.  She is now on home milrinone.  - Echo (4/16): EF 20% with diffuse hypokinesis, moderate RV systolic dysfunction, large pericardial effusion.  No  history of drug or ETOH abuse.  No family history of cardiomyopathy.    - Echo (6/16) with EF 15-20%, restrictive diastolic function, moderate MR, moderately decreased RV systolic function, PA systolic pressure 49 mmHg, moderate-large pericardial effusion without tamponade.   - Echo (7/16) with EF 10-15%, mild LV dilation, diffuse hypokinesis, restrictive diastolic dysfunction, moderate MR, moderate to severely decreased RV systolic function, moderate TR, PA  systolic pressure 51 mmHg, moderate to large pericardial effusion, no change from 6/16.   - Echo (8/16) with EF 10-15%, mild to moderately reduced RV systolic function, moderate pericardial effusion (slightly smaller than prior), PASP 63 mmHg.   - Cardiac MRI (8/16) with LV EF 11%, RV EF 30%, mid-wall LGE involving the basal to mid septum (?myocarditis), moderate to large pericardial effusion with no evidence for tamponade.   - Echo (12/16): EF 20%, LV thrombus, mild MR, PASP 66 mmHg, moderate pericardial effusion.  - Echo (6/17) with EF 10%, small-moderate pericardial effusion.  2. Pericardial effusion: Large on 4/16 echo.  Patient had pericardial window for diagnostic and therapeutic purposes.  Cytology negative for malignancy.  6/16 echo with recurrence of moderate-large pericardial effusion without tamponade, similar repeat echo in 7/16, effusion slightly smaller in 8/16 by echo and MRI.  Moderate effusion on 12/16 echo.  Small to moderate pericardial effusion on 6/17 echo.  3. Atrial fibrillation: Atrial fibrillation with RVR, converted to NSR with amiodarone.   4. HTN 5. Noncompliance 6. LV thrombus: Noted on 12/16 echo.  7. MSSA bacteremia and discitis in 3/17, suspect tunneled catheter infection.  8. Enterobacter cloacae tunneled catheter infection 9. Syncope: 7/17, no definite etiology but concerned for arrhythmia.   Current Outpatient Prescriptions  Medication Sig Dispense Refill  . amiodarone (PACERONE) 200 MG tablet Take 1 tablet (200 mg total) by mouth daily. 30 tablet 6  . feeding supplement (BOOST / RESOURCE BREEZE) LIQD Take 1 Container by mouth 2 (two) times daily between meals.  0  . magnesium oxide (MAG-OX) 400 MG tablet Take 200 mg by mouth daily as needed (leg cramping).    . milrinone (PRIMACOR) 20 MG/100 ML SOLN infusion Inject 11.25 mcg/min into the vein continuous. 11.25 mL 0  . oxyCODONE (OXY IR/ROXICODONE) 5 MG immediate release tablet Take 1 tablet (5 mg total) by  mouth every 6 (six) hours as needed for moderate pain or severe pain. 1 tablet 0  . potassium chloride SA (K-DUR,KLOR-CON) 20 MEQ tablet Take 1 tablet (20 mEq total) by mouth every other day. Take with torsemide. 18 tablet 6  . spironolactone (ALDACTONE) 25 MG tablet Take 0.5 tablets (12.5 mg total) by mouth daily. (Patient taking differently: Take 12.5 mg by mouth 2 (two) times a week. ) 18 tablet 6  . torsemide (DEMADEX) 20 MG tablet Take 1 tablet (20 mg total) by mouth every other day. 18 tablet 6  . warfarin (COUMADIN) 2 MG tablet Take 3 tablets (6 mg total) by mouth daily. (Patient taking differently: Take 6-8 mg by mouth See admin instructions. 6 mg on Sun/Tues/Wed/Fri and 8 mg on Mon/Thurs/Sat) 80 tablet 2   No current facility-administered medications for this encounter.    No Known Allergies  Vitals:   11/09/15 1347  BP: 112/72  Pulse: 90  SpO2: 97%  Weight: 101 lb (45.8 kg)   Wt Readings from Last 3 Encounters:  11/09/15 101 lb (45.8 kg)  10/17/15 98 lb (44.5 kg)  10/12/15 96 lb 1.6 oz (43.6 kg)     PHYSICAL EXAM: General: Elderly,  NAD.  Thin. Husband present.  HEENT: normal Neck: supple. JVP 7 cm.  Carotids 2+ bilat; no bruits. No thyromegaly or nodule noted.  Cor: PMI nondisplaced. Regular rate & rhythm. Paradoxical S2 split Lungs: CTAB, normal effort.  Abdomen: soft, NT, mildly distended. No hepatosplenomegaly. No bruits or masses. +BS Extremities: no cyanosis, clubbing, rash.  No edema. Right upper chest tunneled catheter.     Neuro: alert & oriented x 3, cranial nerves grossly intact. moves all 4 extremities w/o difficulty. Affect pleasant.  ASSESSMENT & PLAN: 1. Chronic systolic HF: Due to nonischemic cardiomyopathy.  cMRI (8/16) showed LV EF 11%, RV EF 30%, and patchy mid-wall LGE in the basal-mid septum that may suggest prior myocarditis.  She is now back on milrinone 0.25 mcg/kg/min via tunneled catheter with no problems.  Echo (6/17) with EF 10%, LV thrombus not  visualized.  NYHA class II symptoms on milrinone.  She is not volume overloaded on exam.   - Continue torsemide 20 mg every other day as she is doing.    - Continue milrinone 0.25 mcg/kg/min, we have been unable to titrate her off this. - I asked her to take spironolactone 12.5 mg daily rather than twice a week.  - No beta blocker with low output.  - She has advanced heart failure with high risk of morbidity/mortality over the next 6 months but she has great difficulty following cardiology recommendations. I again mentioned LVAD to her in light of recurrent tunneled catheter infections and the limitations of home milrinone, she says she will think about it and may be willing to talk with one of the LVAD coordinators at next appt.  - Previous EKG LBBB-like IVCD, QRS 138 msec.  Unlikely that she would derive enough benefit from CRT to get off milrinone.  It is unlikely that she would agree to device placement. 2. PAF: NSR today.   - Continue amio 200 mg daily. Check LFTs and TSH today.  She was told to get regular eye exams while on amiodarone.  - Continue warfarin. No bleeding problems.  3. Pericardial effusion: S/p pericardial window. Transudate, cytology negative.  Last echo in 6/17 showed small to moderate effusion.  4. LV thrombus: She is on warfarin.  Thrombus not visualized on most recent echo.  5. Syncope: Occurred in 7/17.  No definite etiology but concerned for arrhythmia.  She had stopped amiodarone on her own volition prior to this event.  She has refused ICD.  She is now back on amiodarone (I think).  I strongly advised her to take it daily.   Marca AnconaDalton Brynne Doane 11/10/2015

## 2015-11-11 ENCOUNTER — Other Ambulatory Visit (HOSPITAL_COMMUNITY): Payer: Self-pay | Admitting: Cardiology

## 2015-11-11 ENCOUNTER — Other Ambulatory Visit: Payer: Self-pay

## 2015-11-11 NOTE — Patient Outreach (Signed)
[facility]) Care Management  11/11/15  ZHARA WANGER [birth year] [phone]  Attempted to outreach patient without success. Left HIPAA compliant voicemail and provided RNCM contact information.   Turkey R. Jeannifer Drakeford, RN, BSN, CCM [facility] Care Management Coordinator [phone]

## 2015-11-14 ENCOUNTER — Ambulatory Visit (INDEPENDENT_AMBULATORY_CARE_PROVIDER_SITE_OTHER): Payer: Medicare Other

## 2015-11-14 DIAGNOSIS — I480 Paroxysmal atrial fibrillation: Secondary | ICD-10-CM

## 2015-11-14 DIAGNOSIS — I213 ST elevation (STEMI) myocardial infarction of unspecified site: Secondary | ICD-10-CM

## 2015-11-14 DIAGNOSIS — B952 Enterococcus as the cause of diseases classified elsewhere: Secondary | ICD-10-CM | POA: Diagnosis not present

## 2015-11-14 DIAGNOSIS — I5032 Chronic diastolic (congestive) heart failure: Secondary | ICD-10-CM | POA: Diagnosis not present

## 2015-11-14 DIAGNOSIS — I513 Intracardiac thrombosis, not elsewhere classified: Secondary | ICD-10-CM

## 2015-11-14 DIAGNOSIS — I110 Hypertensive heart disease with heart failure: Secondary | ICD-10-CM | POA: Diagnosis not present

## 2015-11-14 DIAGNOSIS — I313 Pericardial effusion (noninflammatory): Secondary | ICD-10-CM | POA: Diagnosis not present

## 2015-11-14 DIAGNOSIS — R7881 Bacteremia: Secondary | ICD-10-CM | POA: Diagnosis not present

## 2015-11-14 DIAGNOSIS — I240 Acute coronary thrombosis not resulting in myocardial infarction: Secondary | ICD-10-CM | POA: Diagnosis not present

## 2015-11-14 DIAGNOSIS — I429 Cardiomyopathy, unspecified: Secondary | ICD-10-CM | POA: Diagnosis not present

## 2015-11-14 DIAGNOSIS — Z7901 Long term (current) use of anticoagulants: Secondary | ICD-10-CM

## 2015-11-14 LAB — POCT INR: INR: 3.1

## 2015-11-16 ENCOUNTER — Encounter: Payer: Self-pay | Admitting: Licensed Clinical Social Worker

## 2015-11-16 DIAGNOSIS — D649 Anemia, unspecified: Secondary | ICD-10-CM | POA: Diagnosis not present

## 2015-11-16 DIAGNOSIS — I240 Acute coronary thrombosis not resulting in myocardial infarction: Secondary | ICD-10-CM | POA: Diagnosis not present

## 2015-11-16 DIAGNOSIS — I110 Hypertensive heart disease with heart failure: Secondary | ICD-10-CM | POA: Diagnosis not present

## 2015-11-16 DIAGNOSIS — I429 Cardiomyopathy, unspecified: Secondary | ICD-10-CM | POA: Diagnosis not present

## 2015-11-16 DIAGNOSIS — I313 Pericardial effusion (noninflammatory): Secondary | ICD-10-CM | POA: Diagnosis not present

## 2015-11-16 DIAGNOSIS — I5032 Chronic diastolic (congestive) heart failure: Secondary | ICD-10-CM | POA: Diagnosis not present

## 2015-11-16 DIAGNOSIS — Z452 Encounter for adjustment and management of vascular access device: Secondary | ICD-10-CM | POA: Diagnosis not present

## 2015-11-16 DIAGNOSIS — I480 Paroxysmal atrial fibrillation: Secondary | ICD-10-CM | POA: Diagnosis not present

## 2015-11-16 DIAGNOSIS — Z7901 Long term (current) use of anticoagulants: Secondary | ICD-10-CM | POA: Diagnosis not present

## 2015-11-16 DIAGNOSIS — Z5181 Encounter for therapeutic drug level monitoring: Secondary | ICD-10-CM | POA: Diagnosis not present

## 2015-11-16 DIAGNOSIS — E46 Unspecified protein-calorie malnutrition: Secondary | ICD-10-CM | POA: Diagnosis not present

## 2015-11-16 NOTE — Progress Notes (Signed)
Patient's husband came to the clinic and requested assistance form CSW to complete [employer] paperwork. Patient applied for aid n assistance due to his wife's care needs and was denied help prior to current application which started on 01/04/15. Patient appealing decision and asking for assistance dated back to April, 2016 when he initially made application at [place] [facility] in [place]. CSW assisted with paperwork and made copies for husband. CSW available if needed further. Lasandra Beech, LCSW [phone]

## 2015-11-17 DIAGNOSIS — I240 Acute coronary thrombosis not resulting in myocardial infarction: Secondary | ICD-10-CM | POA: Diagnosis not present

## 2015-11-17 DIAGNOSIS — I5032 Chronic diastolic (congestive) heart failure: Secondary | ICD-10-CM | POA: Diagnosis not present

## 2015-11-17 DIAGNOSIS — I480 Paroxysmal atrial fibrillation: Secondary | ICD-10-CM | POA: Diagnosis not present

## 2015-11-17 DIAGNOSIS — I110 Hypertensive heart disease with heart failure: Secondary | ICD-10-CM | POA: Diagnosis not present

## 2015-11-17 DIAGNOSIS — I429 Cardiomyopathy, unspecified: Secondary | ICD-10-CM | POA: Diagnosis not present

## 2015-11-17 DIAGNOSIS — I313 Pericardial effusion (noninflammatory): Secondary | ICD-10-CM | POA: Diagnosis not present

## 2015-11-18 ENCOUNTER — Other Ambulatory Visit: Payer: Self-pay

## 2015-11-18 NOTE — Patient Outreach (Signed)
[facility]) Care Management  11/18/15  Katrina Peck Jul 19, 1945 [id number]  Third and final outreach attempt completed without success on 11/18/2015. Left HIPAA compliant voicemail with RNCM contact information and requested callback.   Plan- RNCM will send outreach barrier letter and wait 10 business days before completing case closure if no return call has been received.  [name], RN, BSN, CCM [facility] Care Management Coordinator [phone]

## 2015-11-21 ENCOUNTER — Ambulatory Visit (INDEPENDENT_AMBULATORY_CARE_PROVIDER_SITE_OTHER): Payer: Medicare Other | Admitting: Cardiology

## 2015-11-21 DIAGNOSIS — I429 Cardiomyopathy, unspecified: Secondary | ICD-10-CM | POA: Diagnosis not present

## 2015-11-21 DIAGNOSIS — I313 Pericardial effusion (noninflammatory): Secondary | ICD-10-CM | POA: Diagnosis not present

## 2015-11-21 DIAGNOSIS — I480 Paroxysmal atrial fibrillation: Secondary | ICD-10-CM

## 2015-11-21 DIAGNOSIS — I110 Hypertensive heart disease with heart failure: Secondary | ICD-10-CM | POA: Diagnosis not present

## 2015-11-21 DIAGNOSIS — I213 ST elevation (STEMI) myocardial infarction of unspecified site: Secondary | ICD-10-CM

## 2015-11-21 DIAGNOSIS — Z7901 Long term (current) use of anticoagulants: Secondary | ICD-10-CM

## 2015-11-21 DIAGNOSIS — I513 Intracardiac thrombosis, not elsewhere classified: Secondary | ICD-10-CM

## 2015-11-21 DIAGNOSIS — I5032 Chronic diastolic (congestive) heart failure: Secondary | ICD-10-CM | POA: Diagnosis not present

## 2015-11-21 DIAGNOSIS — I240 Acute coronary thrombosis not resulting in myocardial infarction: Secondary | ICD-10-CM | POA: Diagnosis not present

## 2015-11-21 LAB — POCT INR: INR: 1.5

## 2015-11-24 DIAGNOSIS — I240 Acute coronary thrombosis not resulting in myocardial infarction: Secondary | ICD-10-CM | POA: Diagnosis not present

## 2015-11-24 DIAGNOSIS — I5032 Chronic diastolic (congestive) heart failure: Secondary | ICD-10-CM | POA: Diagnosis not present

## 2015-11-24 DIAGNOSIS — I480 Paroxysmal atrial fibrillation: Secondary | ICD-10-CM | POA: Diagnosis not present

## 2015-11-24 DIAGNOSIS — I313 Pericardial effusion (noninflammatory): Secondary | ICD-10-CM | POA: Diagnosis not present

## 2015-11-24 DIAGNOSIS — I110 Hypertensive heart disease with heart failure: Secondary | ICD-10-CM | POA: Diagnosis not present

## 2015-11-24 DIAGNOSIS — I429 Cardiomyopathy, unspecified: Secondary | ICD-10-CM | POA: Diagnosis not present

## 2015-11-25 ENCOUNTER — Other Ambulatory Visit (HOSPITAL_COMMUNITY): Payer: Self-pay | Admitting: Cardiology

## 2015-11-28 DIAGNOSIS — I110 Hypertensive heart disease with heart failure: Secondary | ICD-10-CM | POA: Diagnosis not present

## 2015-11-28 DIAGNOSIS — I240 Acute coronary thrombosis not resulting in myocardial infarction: Secondary | ICD-10-CM | POA: Diagnosis not present

## 2015-11-28 DIAGNOSIS — I5032 Chronic diastolic (congestive) heart failure: Secondary | ICD-10-CM | POA: Diagnosis not present

## 2015-11-28 DIAGNOSIS — I429 Cardiomyopathy, unspecified: Secondary | ICD-10-CM | POA: Diagnosis not present

## 2015-11-28 DIAGNOSIS — I313 Pericardial effusion (noninflammatory): Secondary | ICD-10-CM | POA: Diagnosis not present

## 2015-11-28 DIAGNOSIS — I480 Paroxysmal atrial fibrillation: Secondary | ICD-10-CM | POA: Diagnosis not present

## 2015-11-29 ENCOUNTER — Ambulatory Visit (INDEPENDENT_AMBULATORY_CARE_PROVIDER_SITE_OTHER): Payer: Medicare Other | Admitting: Cardiology

## 2015-11-29 DIAGNOSIS — I429 Cardiomyopathy, unspecified: Secondary | ICD-10-CM | POA: Diagnosis not present

## 2015-11-29 DIAGNOSIS — I5032 Chronic diastolic (congestive) heart failure: Secondary | ICD-10-CM | POA: Diagnosis not present

## 2015-11-29 DIAGNOSIS — I213 ST elevation (STEMI) myocardial infarction of unspecified site: Secondary | ICD-10-CM

## 2015-11-29 DIAGNOSIS — I313 Pericardial effusion (noninflammatory): Secondary | ICD-10-CM | POA: Diagnosis not present

## 2015-11-29 DIAGNOSIS — I513 Intracardiac thrombosis, not elsewhere classified: Secondary | ICD-10-CM

## 2015-11-29 DIAGNOSIS — I240 Acute coronary thrombosis not resulting in myocardial infarction: Secondary | ICD-10-CM | POA: Diagnosis not present

## 2015-11-29 DIAGNOSIS — I110 Hypertensive heart disease with heart failure: Secondary | ICD-10-CM | POA: Diagnosis not present

## 2015-11-29 DIAGNOSIS — I480 Paroxysmal atrial fibrillation: Secondary | ICD-10-CM | POA: Diagnosis not present

## 2015-11-29 DIAGNOSIS — Z7901 Long term (current) use of anticoagulants: Secondary | ICD-10-CM

## 2015-11-29 LAB — POCT INR: INR: 1.8

## 2015-12-05 ENCOUNTER — Ambulatory Visit (INDEPENDENT_AMBULATORY_CARE_PROVIDER_SITE_OTHER): Payer: Medicare Other | Admitting: Internal Medicine

## 2015-12-05 DIAGNOSIS — I429 Cardiomyopathy, unspecified: Secondary | ICD-10-CM | POA: Diagnosis not present

## 2015-12-05 DIAGNOSIS — I513 Intracardiac thrombosis, not elsewhere classified: Secondary | ICD-10-CM

## 2015-12-05 DIAGNOSIS — Z7901 Long term (current) use of anticoagulants: Secondary | ICD-10-CM

## 2015-12-05 DIAGNOSIS — I480 Paroxysmal atrial fibrillation: Secondary | ICD-10-CM

## 2015-12-05 DIAGNOSIS — I110 Hypertensive heart disease with heart failure: Secondary | ICD-10-CM | POA: Diagnosis not present

## 2015-12-05 DIAGNOSIS — I213 ST elevation (STEMI) myocardial infarction of unspecified site: Secondary | ICD-10-CM

## 2015-12-05 DIAGNOSIS — I240 Acute coronary thrombosis not resulting in myocardial infarction: Secondary | ICD-10-CM | POA: Diagnosis not present

## 2015-12-05 DIAGNOSIS — I5032 Chronic diastolic (congestive) heart failure: Secondary | ICD-10-CM | POA: Diagnosis not present

## 2015-12-05 DIAGNOSIS — I313 Pericardial effusion (noninflammatory): Secondary | ICD-10-CM | POA: Diagnosis not present

## 2015-12-05 LAB — POCT INR: INR: 1.4

## 2015-12-08 ENCOUNTER — Telehealth (HOSPITAL_COMMUNITY): Payer: Self-pay | Admitting: *Deleted

## 2015-12-08 DIAGNOSIS — I110 Hypertensive heart disease with heart failure: Secondary | ICD-10-CM | POA: Diagnosis not present

## 2015-12-08 DIAGNOSIS — I429 Cardiomyopathy, unspecified: Secondary | ICD-10-CM | POA: Diagnosis not present

## 2015-12-08 DIAGNOSIS — I5032 Chronic diastolic (congestive) heart failure: Secondary | ICD-10-CM | POA: Diagnosis not present

## 2015-12-08 DIAGNOSIS — I480 Paroxysmal atrial fibrillation: Secondary | ICD-10-CM | POA: Diagnosis not present

## 2015-12-08 DIAGNOSIS — I313 Pericardial effusion (noninflammatory): Secondary | ICD-10-CM | POA: Diagnosis not present

## 2015-12-08 DIAGNOSIS — I240 Acute coronary thrombosis not resulting in myocardial infarction: Secondary | ICD-10-CM | POA: Diagnosis not present

## 2015-12-08 NOTE — Telephone Encounter (Signed)
[name], RN called to report pt's BP is 82/60, pt usually runs 90-100s.  She states pt is also c/o increased SOB since her coumadin was increased on Monday.  Wt is stable running 99-100 lbs. No edema.  She states pt takes her meds at night so she has not taken anything today.  Pt does have telemonitoring which report BP as 134/93 then 120/79 this AM.  Advised to reassure pt about coumadin and continue to monitor as pt's wt is stable and no edema, she is agreeable

## 2015-12-12 ENCOUNTER — Other Ambulatory Visit (HOSPITAL_COMMUNITY): Payer: Self-pay | Admitting: Cardiology

## 2015-12-12 ENCOUNTER — Ambulatory Visit (INDEPENDENT_AMBULATORY_CARE_PROVIDER_SITE_OTHER): Payer: Medicare Other | Admitting: Cardiovascular Disease

## 2015-12-12 DIAGNOSIS — I5032 Chronic diastolic (congestive) heart failure: Secondary | ICD-10-CM | POA: Diagnosis not present

## 2015-12-12 DIAGNOSIS — I480 Paroxysmal atrial fibrillation: Secondary | ICD-10-CM

## 2015-12-12 DIAGNOSIS — Z7901 Long term (current) use of anticoagulants: Secondary | ICD-10-CM

## 2015-12-12 DIAGNOSIS — I213 ST elevation (STEMI) myocardial infarction of unspecified site: Secondary | ICD-10-CM

## 2015-12-12 DIAGNOSIS — I513 Intracardiac thrombosis, not elsewhere classified: Secondary | ICD-10-CM

## 2015-12-12 DIAGNOSIS — I240 Acute coronary thrombosis not resulting in myocardial infarction: Secondary | ICD-10-CM | POA: Diagnosis not present

## 2015-12-12 DIAGNOSIS — I429 Cardiomyopathy, unspecified: Secondary | ICD-10-CM | POA: Diagnosis not present

## 2015-12-12 DIAGNOSIS — I110 Hypertensive heart disease with heart failure: Secondary | ICD-10-CM | POA: Diagnosis not present

## 2015-12-12 DIAGNOSIS — I10 Essential (primary) hypertension: Secondary | ICD-10-CM | POA: Diagnosis not present

## 2015-12-12 DIAGNOSIS — I313 Pericardial effusion (noninflammatory): Secondary | ICD-10-CM | POA: Diagnosis not present

## 2015-12-12 DIAGNOSIS — I5030 Unspecified diastolic (congestive) heart failure: Secondary | ICD-10-CM | POA: Diagnosis not present

## 2015-12-12 LAB — POCT INR: INR: 1.2

## 2015-12-19 ENCOUNTER — Ambulatory Visit (INDEPENDENT_AMBULATORY_CARE_PROVIDER_SITE_OTHER): Payer: Medicare Other | Admitting: Cardiology

## 2015-12-19 DIAGNOSIS — I110 Hypertensive heart disease with heart failure: Secondary | ICD-10-CM | POA: Diagnosis not present

## 2015-12-19 DIAGNOSIS — I213 ST elevation (STEMI) myocardial infarction of unspecified site: Secondary | ICD-10-CM

## 2015-12-19 DIAGNOSIS — I480 Paroxysmal atrial fibrillation: Secondary | ICD-10-CM | POA: Diagnosis not present

## 2015-12-19 DIAGNOSIS — I5032 Chronic diastolic (congestive) heart failure: Secondary | ICD-10-CM | POA: Diagnosis not present

## 2015-12-19 DIAGNOSIS — I313 Pericardial effusion (noninflammatory): Secondary | ICD-10-CM | POA: Diagnosis not present

## 2015-12-19 DIAGNOSIS — I240 Acute coronary thrombosis not resulting in myocardial infarction: Secondary | ICD-10-CM | POA: Diagnosis not present

## 2015-12-19 DIAGNOSIS — I429 Cardiomyopathy, unspecified: Secondary | ICD-10-CM | POA: Diagnosis not present

## 2015-12-19 DIAGNOSIS — I513 Intracardiac thrombosis, not elsewhere classified: Secondary | ICD-10-CM

## 2015-12-19 DIAGNOSIS — Z7901 Long term (current) use of anticoagulants: Secondary | ICD-10-CM

## 2015-12-19 LAB — POCT INR: INR: 1.4

## 2015-12-22 DIAGNOSIS — I313 Pericardial effusion (noninflammatory): Secondary | ICD-10-CM | POA: Diagnosis not present

## 2015-12-22 DIAGNOSIS — I480 Paroxysmal atrial fibrillation: Secondary | ICD-10-CM | POA: Diagnosis not present

## 2015-12-22 DIAGNOSIS — I429 Cardiomyopathy, unspecified: Secondary | ICD-10-CM | POA: Diagnosis not present

## 2015-12-22 DIAGNOSIS — I110 Hypertensive heart disease with heart failure: Secondary | ICD-10-CM | POA: Diagnosis not present

## 2015-12-22 DIAGNOSIS — I240 Acute coronary thrombosis not resulting in myocardial infarction: Secondary | ICD-10-CM | POA: Diagnosis not present

## 2015-12-22 DIAGNOSIS — I5032 Chronic diastolic (congestive) heart failure: Secondary | ICD-10-CM | POA: Diagnosis not present

## 2015-12-23 ENCOUNTER — Ambulatory Visit (HOSPITAL_COMMUNITY)
Admission: RE | Admit: 2015-12-23 | Discharge: 2015-12-23 | Disposition: A | Discharge status: Home / Self Care | Payer: Medicare Other | Source: Ambulatory Visit | Attending: Cardiology | Admitting: Cardiology

## 2015-12-23 VITALS — BP 104/62 | HR 87 | Wt 102.0 lb

## 2015-12-23 DIAGNOSIS — I480 Paroxysmal atrial fibrillation: Secondary | ICD-10-CM | POA: Diagnosis not present

## 2015-12-23 DIAGNOSIS — Z7901 Long term (current) use of anticoagulants: Secondary | ICD-10-CM | POA: Insufficient documentation

## 2015-12-23 DIAGNOSIS — I110 Hypertensive heart disease with heart failure: Secondary | ICD-10-CM | POA: Insufficient documentation

## 2015-12-23 DIAGNOSIS — I447 Left bundle-branch block, unspecified: Secondary | ICD-10-CM | POA: Diagnosis not present

## 2015-12-23 DIAGNOSIS — Z79899 Other long term (current) drug therapy: Secondary | ICD-10-CM | POA: Insufficient documentation

## 2015-12-23 DIAGNOSIS — I5022 Chronic systolic (congestive) heart failure: Secondary | ICD-10-CM | POA: Diagnosis not present

## 2015-12-23 DIAGNOSIS — I313 Pericardial effusion (noninflammatory): Secondary | ICD-10-CM | POA: Insufficient documentation

## 2015-12-23 DIAGNOSIS — R55 Syncope and collapse: Secondary | ICD-10-CM | POA: Insufficient documentation

## 2015-12-23 DIAGNOSIS — I82890 Acute embolism and thrombosis of other specified veins: Secondary | ICD-10-CM | POA: Diagnosis not present

## 2015-12-23 LAB — COMPREHENSIVE METABOLIC PANEL
ALBUMIN: 3.8 g/dL (ref 3.5–5.0)
ALT: 17 U/L (ref 14–54)
ANION GAP: 4 — AB (ref 5–15)
AST: 25 U/L (ref 15–41)
Alkaline Phosphatase: 86 U/L (ref 38–126)
BILIRUBIN TOTAL: 1.4 mg/dL — AB (ref 0.3–1.2)
BUN: 15 mg/dL (ref 6–20)
CO2: 26 mmol/L (ref 22–32)
Calcium: 9.8 mg/dL (ref 8.9–10.3)
Chloride: 110 mmol/L (ref 101–111)
Creatinine, Ser: 0.84 mg/dL (ref 0.44–1.00)
GLUCOSE: 104 mg/dL — AB (ref 65–99)
POTASSIUM: 4.9 mmol/L (ref 3.5–5.1)
Sodium: 140 mmol/L (ref 135–145)
TOTAL PROTEIN: 7.4 g/dL (ref 6.5–8.1)

## 2015-12-23 LAB — TSH: TSH: 0.948 u[IU]/mL (ref 0.350–4.500)

## 2015-12-23 NOTE — Patient Instructions (Signed)
Labs today  Your physician recommends that you schedule a follow-up appointment in: 2 months    

## 2015-12-26 ENCOUNTER — Ambulatory Visit (INDEPENDENT_AMBULATORY_CARE_PROVIDER_SITE_OTHER): Payer: Medicare Other | Admitting: Interventional Cardiology

## 2015-12-26 DIAGNOSIS — I110 Hypertensive heart disease with heart failure: Secondary | ICD-10-CM | POA: Diagnosis not present

## 2015-12-26 DIAGNOSIS — I429 Cardiomyopathy, unspecified: Secondary | ICD-10-CM | POA: Diagnosis not present

## 2015-12-26 DIAGNOSIS — I480 Paroxysmal atrial fibrillation: Secondary | ICD-10-CM | POA: Diagnosis not present

## 2015-12-26 DIAGNOSIS — I240 Acute coronary thrombosis not resulting in myocardial infarction: Secondary | ICD-10-CM | POA: Diagnosis not present

## 2015-12-26 DIAGNOSIS — I313 Pericardial effusion (noninflammatory): Secondary | ICD-10-CM | POA: Diagnosis not present

## 2015-12-26 DIAGNOSIS — I5032 Chronic diastolic (congestive) heart failure: Secondary | ICD-10-CM | POA: Diagnosis not present

## 2015-12-26 LAB — POCT INR: INR: 2.9

## 2015-12-26 NOTE — Progress Notes (Signed)
Patient ID: CHARENE MCCALLISTER, female   DOB: 1945-06-15, 70 y.o.   MRN: [id number]    Advanced Heart Failure Clinic Note   PCP: Dr. Duanne Guess Cardiology: Dr. Shirlee Latch  70 y/o female with history of HTN who was admitted 07/20/14 with 3 months of exertional dyspnea, weight gain, cough, and orthopnea. Found to have EF 20% and a large pericardial effusion. Cath 5/16 no CAD. Hospital course complicated by atrial fibrillation w/ RVR, converted to NSR on amiodarone. Underwent pericardial window (transudate).    She had a repeat echo in 6/16 that showed persistently low LV EF at 15-20% with recurrence of moderate to large pericardial effusion without tamponade. This showed EF 10-15%, mild LV dilation, restrictive diastolic function, moderate to severely decreased RV function, and a moderate to large pericardial effusion similar to 6/16.   Admitted in 11/16 with marked volume overload. Diuresed with IV lasix. Discharged on milrinone 0.125 mcg. BB and ACE stopped. Discharge weight was 92 pounds.   She had problems with her PICC line (numbness/pain in right arm/hand) and wanted the PICC line out.  She did not want placement of tunneled catheter.  Therefore, PICC was removed and milrinone stopped.  She re-developed symptomatic low output CHF and was re-admitted in 12/16.  She then had tunneled catheter placed and was restarted on milrinone and was diuresed.  She was discharged home back on milrinone.  Echo in 12/16 showed EF 20% with LV thrombus.  She was put on coumadin.   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 Tunneled cath was removed and milrinone given through peripheral IV for several days. She completed a course of ceftriaxone x 6 weeks.  Tunneled cath replaced with repeat cultures negative for home inotropic support.    Admission 6/17 with tunneled catheter infection, cultures grew Enterobacter cloacae.   Catheter was replaced to allow ongoing home milrinone.    She was admitted in 7/17 with a syncopal episode. No prodrome. No clear diagnosis but she had stopped her amiodarone so I am concerned that it was VT/VF.  She has refused ICD.    She returns for HF follow up.  She is stable.  Generally feels well.  Minimal exertional dyspnea, able to walk into office without problems.  No further lightheadednes or syncope. No orthopnea, PND, palpitations.  No BRBPR or melena on warfarin.    Labs  5/16: K 4.6, creatinine 1.0 => 0.81, TSH normal, LFTs normal, TSH normal, BNP 1268 7/16: K 5.3 => 5.4, creatinine 1.0 => 1.02, HCT 39.7, BNP > 4500 => 1082, SPEP negative, urine IFE negative 8/16: K 5, creatinine 0.83 11/30/14: K 4.2 Creatinine 0.79  02/09/2015: K 4.5 Creatinine 1.11 12/16: digoxin < 0.2 1/17: K 3.8, creatinine 0.96, HCT 34.9 3/17: K 3.8, creatinine 0.77 6/17: K 4.1, creatinine 0.99, HCT 30.4 7/17: K 4.1, creatinine 1.03 9/17: K 4.6, creatinine 0.96, Hgb 12.1  SH: Married, no smoking, no ETOH.   FH: No history of cardiomyopathy or sudden death.    ROS: All systems reviewed and negative except as per HPI.   PMH: 1. Chronic systolic CHF: Nonischemic cardiomyopathy.  LHC/RHC (4/16) with no significant coronary disease; mean RA 5, PA 28/11, mean PCWP 9, CI 3.39.  She is now on home milrinone.  - Echo (4/16): EF 20% with diffuse hypokinesis, moderate RV systolic dysfunction, large pericardial effusion.  No history of drug or ETOH abuse.  No family history  of cardiomyopathy.    - Echo (6/16) with EF 15-20%, restrictive diastolic function, moderate MR, moderately decreased RV systolic function, PA systolic pressure 49 mmHg, moderate-large pericardial effusion without tamponade.   - Echo (7/16) with EF 10-15%, mild LV dilation, diffuse hypokinesis, restrictive diastolic dysfunction, moderate MR, moderate to severely decreased RV systolic function, moderate TR, PA systolic pressure 51 mmHg, moderate  to large pericardial effusion, no change from 6/16.   - Echo (8/16) with EF 10-15%, mild to moderately reduced RV systolic function, moderate pericardial effusion (slightly smaller than prior), PASP 63 mmHg.   - Cardiac MRI (8/16) with LV EF 11%, RV EF 30%, mid-wall LGE involving the basal to mid septum (?myocarditis), moderate to large pericardial effusion with no evidence for tamponade.   - Echo (12/16): EF 20%, LV thrombus, mild MR, PASP 66 mmHg, moderate pericardial effusion.  - Echo (6/17) with EF 10%, small-moderate pericardial effusion.  2. Pericardial effusion: Large on 4/16 echo.  Patient had pericardial window for diagnostic and therapeutic purposes.  Cytology negative for malignancy.  6/16 echo with recurrence of moderate-large pericardial effusion without tamponade, similar repeat echo in 7/16, effusion slightly smaller in 8/16 by echo and MRI.  Moderate effusion on 12/16 echo.  Small to moderate pericardial effusion on 6/17 echo.  3. Atrial fibrillation: Atrial fibrillation with RVR, converted to NSR with amiodarone.   4. HTN 5. Noncompliance 6. LV thrombus: Noted on 12/16 echo.  7. MSSA bacteremia and discitis in 3/17, suspect tunneled catheter infection.  8. Enterobacter cloacae tunneled catheter infection 9. Syncope: 7/17, no definite etiology but concerned for arrhythmia.   Current Outpatient Prescriptions  Medication Sig Dispense Refill  . amiodarone (PACERONE) 200 MG tablet Take 1 tablet (200 mg total) by mouth daily. 30 tablet 6  . feeding supplement (BOOST / RESOURCE BREEZE) LIQD Take 1 Container by mouth 2 (two) times daily between meals.  0  . magnesium oxide (MAG-OX) 400 MG tablet Take 200 mg by mouth daily as needed (leg cramping).    . milrinone (PRIMACOR) 20 MG/100 ML SOLN infusion Inject 11.25 mcg/min into the vein continuous. 11.25 mL 0  . oxyCODONE (OXY IR/ROXICODONE) 5 MG immediate release tablet Take 1 tablet (5 mg total) by mouth every 6 (six) hours as needed for  moderate pain or severe pain. 1 tablet 0  . potassium chloride SA (K-DUR,KLOR-CON) 20 MEQ tablet Take 1 tablet (20 mEq total) by mouth every other day. Take with torsemide. 18 tablet 6  . spironolactone (ALDACTONE) 25 MG tablet Take 0.5 tablets (12.5 mg total) by mouth daily. (Patient taking differently: Take 12.5 mg by mouth 2 (two) times a week. ) 18 tablet 6  . torsemide (DEMADEX) 20 MG tablet Take 1 tablet (20 mg total) by mouth every other day. 18 tablet 6  . warfarin (COUMADIN) 2 MG tablet Take 3 tablets (6 mg total) by mouth daily. (Patient taking differently: Take 6-8 mg by mouth See admin instructions. 6 mg on Sun/Tues/Wed/Fri and 8 mg on Mon/Thurs/Sat) 80 tablet 2   No current facility-administered medications for this encounter.    No Known Allergies  Vitals:   12/23/15 1406  BP: 104/62  Pulse: 87  SpO2: 100%  Weight: 102 lb (46.3 kg)   Wt Readings from Last 3 Encounters:  12/23/15 102 lb (46.3 kg)  11/09/15 101 lb (45.8 kg)  10/17/15 98 lb (44.5 kg)     PHYSICAL EXAM: General: Elderly, NAD.  Thin. Husband present.  HEENT: normal Neck: supple. JVP 7  cm.  Carotids 2+ bilat; no bruits. No thyromegaly or nodule noted.  Cor: PMI nondisplaced. Regular rate & rhythm. Paradoxical S2 split Lungs: CTAB, normal effort.  Abdomen: soft, NT, mildly distended. No hepatosplenomegaly. No bruits or masses. +BS Extremities: no cyanosis, clubbing, rash.  No edema. Right upper chest tunneled catheter.     Neuro: alert & oriented x 3, cranial nerves grossly intact. moves all 4 extremities w/o difficulty. Affect pleasant.  ASSESSMENT & PLAN: 1. Chronic systolic HF: Due to nonischemic cardiomyopathy.  cMRI (8/16) showed LV EF 11%, RV EF 30%, and patchy mid-wall LGE in the basal-mid septum that may suggest prior myocarditis.  She is now back on milrinone 0.25 mcg/kg/min via tunneled catheter with no problems.  Echo (6/17) with EF 10%, LV thrombus not visualized.  NYHA class II symptoms on  milrinone.  She is not volume overloaded on exam.   - Continue torsemide 20 mg every other day as she is doing.    - Continue milrinone 0.25 mcg/kg/min, we have been unable to titrate her off this. - Continue spironolactone 12.5 daily.   - No beta blocker with low output.  - She has advanced heart failure with high risk of morbidity/mortality over the next 6 months but she has great difficulty following cardiology recommendations. I again mentioned LVAD to her in light of recurrent tunneled catheter infections and the limitations of home milrinone, she says she will think about it and may be willing to talk with one of the LVAD coordinators in the future.  - Previous EKG LBBB-like IVCD, QRS 138 msec.  Unlikely that she would derive enough benefit from CRT to get off milrinone.  It is unlikely that she would agree to device placement. 2. PAF: NSR today.   - Continue amio 200 mg daily. Check LFTs and TSH today.  She was told to get regular eye exams while on amiodarone.  - Continue warfarin. No bleeding problems.  3. Pericardial effusion: S/p pericardial window. Transudate, cytology negative.  Last echo in 6/17 showed small to moderate effusion.  4. LV thrombus: She is on warfarin.  Thrombus not visualized on most recent echo.  5. Syncope: Occurred in 7/17.  No definite etiology but concerned for arrhythmia.  She had stopped amiodarone on her own volition prior to this event.  She has refused ICD.  She is now back on amiodarone (I think).  I strongly advised her to take it daily.  She can drive after 22/56 if no further syncope.   [name] 12/26/2015

## 2015-12-27 ENCOUNTER — Telehealth (HOSPITAL_COMMUNITY): Payer: Self-pay

## 2015-12-27 NOTE — Telephone Encounter (Signed)
Katrina Peck with [facility] called to report she was unsuccessful in getting routine lab draw today. Patient recently had CMET drawn last week in [facility] c[facility]inic that was stable per Dr. Shirlee Latch. Advised ok per Dr. Shirlee Latch to retry in 1-2 weeks for redraw.  Ave Filter, RN

## 2016-01-02 DIAGNOSIS — I313 Pericardial effusion (noninflammatory): Secondary | ICD-10-CM | POA: Diagnosis not present

## 2016-01-02 DIAGNOSIS — I480 Paroxysmal atrial fibrillation: Secondary | ICD-10-CM | POA: Diagnosis not present

## 2016-01-02 DIAGNOSIS — I110 Hypertensive heart disease with heart failure: Secondary | ICD-10-CM | POA: Diagnosis not present

## 2016-01-02 DIAGNOSIS — I5032 Chronic diastolic (congestive) heart failure: Secondary | ICD-10-CM | POA: Diagnosis not present

## 2016-01-02 DIAGNOSIS — I429 Cardiomyopathy, unspecified: Secondary | ICD-10-CM | POA: Diagnosis not present

## 2016-01-02 DIAGNOSIS — I240 Acute coronary thrombosis not resulting in myocardial infarction: Secondary | ICD-10-CM | POA: Diagnosis not present

## 2016-01-05 DIAGNOSIS — I429 Cardiomyopathy, unspecified: Secondary | ICD-10-CM | POA: Diagnosis not present

## 2016-01-05 DIAGNOSIS — I480 Paroxysmal atrial fibrillation: Secondary | ICD-10-CM | POA: Diagnosis not present

## 2016-01-05 DIAGNOSIS — I5032 Chronic diastolic (congestive) heart failure: Secondary | ICD-10-CM | POA: Diagnosis not present

## 2016-01-05 DIAGNOSIS — I240 Acute coronary thrombosis not resulting in myocardial infarction: Secondary | ICD-10-CM | POA: Diagnosis not present

## 2016-01-05 DIAGNOSIS — I313 Pericardial effusion (noninflammatory): Secondary | ICD-10-CM | POA: Diagnosis not present

## 2016-01-05 DIAGNOSIS — I110 Hypertensive heart disease with heart failure: Secondary | ICD-10-CM | POA: Diagnosis not present

## 2016-01-09 ENCOUNTER — Emergency Department (HOSPITAL_COMMUNITY): Payer: Medicare Other

## 2016-01-09 ENCOUNTER — Encounter (HOSPITAL_COMMUNITY): Payer: Self-pay

## 2016-01-09 ENCOUNTER — Inpatient Hospital Stay (HOSPITAL_COMMUNITY)
Admission: EM | Admit: 2016-01-09 | Discharge: 2016-01-10 | DRG: 309 | Disposition: A | Discharge status: Home Health | Payer: Medicare Other | Attending: Cardiology | Admitting: Cardiology

## 2016-01-09 ENCOUNTER — Ambulatory Visit (INDEPENDENT_AMBULATORY_CARE_PROVIDER_SITE_OTHER): Payer: Medicare Other | Admitting: Internal Medicine

## 2016-01-09 DIAGNOSIS — I498 Other specified cardiac arrhythmias: Principal | ICD-10-CM | POA: Diagnosis present

## 2016-01-09 DIAGNOSIS — I447 Left bundle-branch block, unspecified: Secondary | ICD-10-CM | POA: Diagnosis present

## 2016-01-09 DIAGNOSIS — Z79899 Other long term (current) drug therapy: Secondary | ICD-10-CM | POA: Diagnosis not present

## 2016-01-09 DIAGNOSIS — R0602 Shortness of breath: Secondary | ICD-10-CM | POA: Diagnosis not present

## 2016-01-09 DIAGNOSIS — R55 Syncope and collapse: Secondary | ICD-10-CM | POA: Diagnosis present

## 2016-01-09 DIAGNOSIS — I313 Pericardial effusion (noninflammatory): Secondary | ICD-10-CM | POA: Diagnosis present

## 2016-01-09 DIAGNOSIS — I509 Heart failure, unspecified: Secondary | ICD-10-CM | POA: Diagnosis not present

## 2016-01-09 DIAGNOSIS — I110 Hypertensive heart disease with heart failure: Secondary | ICD-10-CM | POA: Diagnosis present

## 2016-01-09 DIAGNOSIS — I429 Cardiomyopathy, unspecified: Secondary | ICD-10-CM | POA: Diagnosis present

## 2016-01-09 DIAGNOSIS — Z8249 Family history of ischemic heart disease and other diseases of the circulatory system: Secondary | ICD-10-CM | POA: Diagnosis not present

## 2016-01-09 DIAGNOSIS — I481 Persistent atrial fibrillation: Secondary | ICD-10-CM | POA: Diagnosis present

## 2016-01-09 DIAGNOSIS — Z833 Family history of diabetes mellitus: Secondary | ICD-10-CM | POA: Diagnosis not present

## 2016-01-09 DIAGNOSIS — R404 Transient alteration of awareness: Secondary | ICD-10-CM | POA: Diagnosis not present

## 2016-01-09 DIAGNOSIS — I5022 Chronic systolic (congestive) heart failure: Secondary | ICD-10-CM | POA: Diagnosis not present

## 2016-01-09 DIAGNOSIS — Z9114 Patient's other noncompliance with medication regimen: Secondary | ICD-10-CM

## 2016-01-09 DIAGNOSIS — I480 Paroxysmal atrial fibrillation: Secondary | ICD-10-CM | POA: Diagnosis present

## 2016-01-09 DIAGNOSIS — Z7901 Long term (current) use of anticoagulants: Secondary | ICD-10-CM

## 2016-01-09 DIAGNOSIS — I240 Acute coronary thrombosis not resulting in myocardial infarction: Secondary | ICD-10-CM | POA: Diagnosis not present

## 2016-01-09 DIAGNOSIS — I5032 Chronic diastolic (congestive) heart failure: Secondary | ICD-10-CM | POA: Diagnosis not present

## 2016-01-09 LAB — CBC WITH DIFFERENTIAL/PLATELET
Basophils Absolute: 0 10*3/uL (ref 0.0–0.1)
Basophils Relative: 0 %
Eosinophils Absolute: 0.1 10*3/uL (ref 0.0–0.7)
Eosinophils Relative: 1 %
HEMATOCRIT: 40.5 % (ref 36.0–46.0)
HEMOGLOBIN: 13 g/dL (ref 12.0–15.0)
LYMPHS ABS: 1 10*3/uL (ref 0.7–4.0)
LYMPHS PCT: 11 %
MCH: 27.1 pg (ref 26.0–34.0)
MCHC: 32.1 g/dL (ref 30.0–36.0)
MCV: 84.4 fL (ref 78.0–100.0)
MONOS PCT: 7 %
Monocytes Absolute: 0.7 10*3/uL (ref 0.1–1.0)
NEUTROS ABS: 7.4 10*3/uL (ref 1.7–7.7)
NEUTROS PCT: 81 %
Platelets: 211 10*3/uL (ref 150–400)
RBC: 4.8 MIL/uL (ref 3.87–5.11)
RDW: 15.3 % (ref 11.5–15.5)
WBC: 9.1 10*3/uL (ref 4.0–10.5)

## 2016-01-09 LAB — COMPREHENSIVE METABOLIC PANEL
ALK PHOS: 94 U/L (ref 38–126)
ALT: 27 U/L (ref 14–54)
ANION GAP: 8 (ref 5–15)
AST: 32 U/L (ref 15–41)
Albumin: 4.1 g/dL (ref 3.5–5.0)
BILIRUBIN TOTAL: 1.2 mg/dL (ref 0.3–1.2)
BUN: 19 mg/dL (ref 6–20)
CALCIUM: 10.2 mg/dL (ref 8.9–10.3)
CO2: 28 mmol/L (ref 22–32)
CREATININE: 1.16 mg/dL — AB (ref 0.44–1.00)
Chloride: 105 mmol/L (ref 101–111)
GFR calc non Af Amer: 47 mL/min — ABNORMAL LOW (ref >60)
GFR, EST AFRICAN AMERICAN: 54 mL/min — AB (ref >60)
Glucose, Bld: 99 mg/dL (ref 65–99)
Potassium: 5.1 mmol/L (ref 3.5–5.1)
Sodium: 141 mmol/L (ref 135–145)
TOTAL PROTEIN: 8.5 g/dL — AB (ref 6.5–8.1)

## 2016-01-09 LAB — I-STAT CG4 LACTIC ACID, ED: Lactic Acid, Venous: 2.39 mmol/L (ref 0.5–1.9)

## 2016-01-09 LAB — PROTIME-INR
INR: 1.15
Prothrombin Time: 14.8 seconds (ref 11.4–15.2)

## 2016-01-09 LAB — POCT INR: INR: 1.1

## 2016-01-09 LAB — BRAIN NATRIURETIC PEPTIDE: B NATRIURETIC PEPTIDE 5: 1300 pg/mL — AB (ref 0.0–100.0)

## 2016-01-09 LAB — I-STAT TROPONIN, ED: TROPONIN I, POC: 0.03 ng/mL (ref 0.00–0.08)

## 2016-01-09 LAB — MAGNESIUM: MAGNESIUM: 2 mg/dL (ref 1.7–2.4)

## 2016-01-09 MED ORDER — MILRINONE LACTATE IN DEXTROSE 20-5 MG/100ML-% IV SOLN
0.2500 ug/kg/min | INTRAVENOUS | Status: DC
  Administered 2016-01-09: 0.25 ug/kg/min via INTRAVENOUS
  Filled 2016-01-09: qty 100

## 2016-01-09 MED ORDER — ONDANSETRON HCL 4 MG/2ML IJ SOLN: 4.0000 mg | Freq: Four times a day (QID) | INTRAMUSCULAR | Status: DC | PRN

## 2016-01-09 MED ORDER — MAGNESIUM OXIDE 400 (241.3 MG) MG PO TABS: 200.0000 mg | ORAL_TABLET | Freq: Every day | ORAL | Status: DC | PRN

## 2016-01-09 MED ORDER — AMIODARONE HCL 200 MG PO TABS
200.0000 mg | ORAL_TABLET | Freq: Every day | ORAL | Status: DC
  Administered 2016-01-09 – 2016-01-10 (×2): 200 mg via ORAL
  Filled 2016-01-09 (×2): qty 1

## 2016-01-09 MED ORDER — SODIUM CHLORIDE 0.9% FLUSH
10.0000 mL | INTRAVENOUS | Status: DC | PRN
Start: 2016-01-09 — End: 2016-01-10

## 2016-01-09 MED ORDER — SODIUM CHLORIDE 0.9 % IV SOLN: 250.0000 mL | INTRAVENOUS | Status: DC | PRN

## 2016-01-09 MED ORDER — MILRINONE LACTATE IN DEXTROSE 20-5 MG/100ML-% IV SOLN: 0.2500 ug/kg/min | INTRAVENOUS | Status: DC

## 2016-01-09 MED ORDER — WARFARIN SODIUM 5 MG PO TABS
5.0000 mg | ORAL_TABLET | Freq: Once | ORAL | Status: AC
  Administered 2016-01-09: 5 mg via ORAL
  Filled 2016-01-09: qty 1

## 2016-01-09 MED ORDER — WARFARIN - PHARMACIST DOSING INPATIENT: Freq: Every day | Status: DC

## 2016-01-09 MED ORDER — SPIRONOLACTONE 25 MG PO TABS
12.5000 mg | ORAL_TABLET | Freq: Every day | ORAL | Status: DC
  Administered 2016-01-09 – 2016-01-10 (×2): 12.5 mg via ORAL
  Filled 2016-01-09 (×2): qty 1

## 2016-01-09 MED ORDER — ACETAMINOPHEN 325 MG PO TABS: 650.0000 mg | ORAL_TABLET | ORAL | Status: DC | PRN

## 2016-01-09 MED ORDER — SODIUM CHLORIDE 0.9% FLUSH: 3.0000 mL | INTRAVENOUS | Status: DC | PRN

## 2016-01-09 MED ORDER — WARFARIN SODIUM 2 MG PO TABS: ORAL_TABLET | ORAL | 2 refills | Status: DC

## 2016-01-09 MED ORDER — SODIUM CHLORIDE 0.9% FLUSH
3.0000 mL | Freq: Two times a day (BID) | INTRAVENOUS | Status: DC
  Administered 2016-01-09 – 2016-01-10 (×2): 3 mL via INTRAVENOUS

## 2016-01-09 MED ORDER — MILRINONE LACTATE IN DEXTROSE 20-5 MG/100ML-% IV SOLN: 0.1250 ug/kg/min | INTRAVENOUS | Status: DC

## 2016-01-09 NOTE — ED Provider Notes (Signed)
[facility]-EMERGENCY DEPT Provider Note   CSN: [id number] Arrival date & time: 01/09/16  1324     History   Chief Complaint Chief Complaint  Patient presents with  . Loss of Consciousness    HPI Katrina Peck is a 70 y.o. female With a past medical history significant for hypertension, atrial fibrillation, CHF on continuous Milrinone therapy who presents for syncope. Patient reports that she was at home today getting blood drawn when she had two episodes of syncope. According to patient, she had no preceding symptoms such as chest pain or palpitations however, she reports feeling slightly lightheaded and fatigued. She says that she took her torsemide last night because her weight was slightly elevated at 101. She says that it was down to 97 pounds and thinks he may have taken too much fluid off.  She does report some mild shortness of breath and cough but denies fevers. She reports a mild chill.  According to EMS, the patient was completely unresponsive and did not feel to have radial pulses initially. CPR was not initiated after they found a carotid pulse. No evidence of postictal period according to EMS.       Loss of Consciousness   This is a new problem. The current episode started less than 1 hour ago. The problem occurs rarely. The problem has not changed since onset.She lost consciousness for a period of less than one minute. The problem is associated with normal activity. Associated symptoms include light-headedness. Pertinent negatives include abdominal pain, back pain, bowel incontinence, chest pain, congestion, dizziness, fever, focal weakness, headaches, nausea, palpitations, seizures, slurred speech, visual change and vomiting. She has tried nothing for the symptoms.    Past Medical History:  Diagnosis Date  . Anemia   . Bacteremia    a. MSSA bacteremia and discitis in 3/17, suspect tunneled catheter infection.   . Blood transfusion without reported diagnosis   . Chronic  systolic CHF (congestive heart failure) (HCC)    a. 06/2014 Echo: EF 20%.  . Discitis   . Hypertension   . LV (left ventricular) mural thrombus    a. Noted on 12/16 echo.   Marland Kitchen NICM (nonischemic cardiomyopathy) (HCC)    a. 06/2014 Echo: EF 20%, diff HK, mild MR, mildly dil LA/RA, mildly reduced RV fxn;  b. 06/2014 Cath: nl cors.  . Noncompliance   . PAF (paroxysmal atrial fibrillation) (HCC)    a. with RVR, converted to NSR with amiodarone.  . Pericardial effusion    a. 06/2014 Large effusion, no tamponade, s/p window for diag/therap - neg malig. b. 6/16 - recurrence w/o tamponade, subsequently folloed by echoes. Mod 12/16.  Marland Kitchen Syncope 09/2015    Patient Active Problem List   Diagnosis Date Noted  . Syncope 10/10/2015  . Fever, unspecified   . Gram-negative infection   . PICC line infection   . SOB (shortness of breath)   . Klebsiella pneumoniae sepsis (HCC) 06/25/2015  . Pulmonary hypertension 06/25/2015  . Lung nodule   . Discitis of thoracic region   . Malnutrition of moderate degree 06/07/2015  . Leukocytosis   . Acute blood loss anemia   . Bacteremia   . MSSA (methicillin susceptible Staphylococcus aureus) septicemia (HCC) 06/05/2015  . Atrial fibrillation (HCC) [I48.91] 03/31/2015  . Long term (current) use of anticoagulants [Z79.01] 03/31/2015  . Non compliance w medication regimen 03/28/2015  . Protein-calorie malnutrition, severe (HCC) 03/28/2015  . LV (left ventricular) mural thrombus   . Paroxysmal atrial fibrillation (HCC) 09/29/2014  .  Chronic systolic heart failure (HCC) 08/07/2014  . NICM (nonischemic cardiomyopathy) (HCC) 07/24/2014  . 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 Pericardial effusion 07/24/2014    Past Surgical History:  Procedure Laterality Date  . CARDIAC CATHETERIZATION N/A 02/07/2015   Procedure: Right Heart Cath;  Surgeon: Laurey Morale, MD;  Location: [facility] INVASIVE CV LAB;  Service: Cardiovascular;   Laterality: N/A;  . LEFT AND RIGHT HEART CATHETERIZATION WITH CORONARY ANGIOGRAM N/A 07/23/2014   Procedure: LEFT AND RIGHT HEART CATHETERIZATION WITH CORONARY ANGIOGRAM;  Surgeon: Laurey Morale, MD;  Location: [facility] CATH LAB;  Service: Cardiovascular;  Laterality: N/A;  . PERIPHERALLY INSERTED CENTRAL CATHETER INSERTION  10/10/2015  . PLEURAL EFFUSION DRAINAGE N/A 07/26/2014   Procedure: DRAINAGE OF PERICARDIAL EFFUSION;  Surgeon: Delight Ovens, MD;  Location: [facility] OR;  Service: Thoracic;  Laterality: N/A;  . RADIOLOGY WITH ANESTHESIA N/A 06/08/2015   Procedure: MRI CERVICAL SPINE WITH/WITHOUT LUMBAR WITH/WITHOUT THORACIC WITH/WITHOUT       (RADIOLOGY WITH ANESTHESIA);  Surgeon: Medication Radiologist, MD;  Location: [facility] OR;  Service: Radiology;  Laterality: N/A;  . SUBXYPHOID PERICARDIAL WINDOW N/A 07/26/2014   Procedure: SUBXYPHOID PERICARDIAL WINDOW;  Surgeon: Delight Ovens, MD;  Location: [facility] OR;  Service: Thoracic;  Laterality: N/A;  . TEE WITHOUT CARDIOVERSION N/A 07/26/2014   Procedure: TRANSESOPHAGEAL ECHOCARDIOGRAM (TEE);  Surgeon: Delight Ovens, MD;  Location: [facility] OR;  Service: Thoracic;  Laterality: N/A;  . TEE WITHOUT CARDIOVERSION N/A 06/06/2015   Procedure: TRANSESOPHAGEAL ECHOCARDIOGRAM (TEE);  Surgeon: Dolores Patty, MD;  Location: [facility] ENDOSCOPY;  Service: Cardiovascular;  Laterality: N/A;    OB History    No data available       Home Medications    Prior to Admission medications   Medication Sig Start Date End Date Taking? Authorizing Provider  amiodarone (PACERONE) 200 MG tablet Take 1 tablet (200 mg total) by mouth daily. 10/12/15   Graciella Freer, PA-C  feeding supplement (BOOST / RESOURCE BREEZE) LIQD Take 1 Container by mouth 2 (two) times daily between meals. 10/12/15   Leana Roe Elgergawy, MD  magnesium oxide (MAG-OX) 400 MG tablet Take 200 mg by mouth daily as needed (leg cramping).    Historical Provider, MD  milrinone (PRIMACOR) 20 MG/100 ML SOLN  infusion Inject 11.25 mcg/min into the vein continuous. 09/12/15   Alison Murray, MD  oxyCODONE (OXY IR/ROXICODONE) 5 MG immediate release tablet Take 1 tablet (5 mg total) by mouth every 6 (six) hours as needed for moderate pain or severe pain. 10/12/15   Leana Roe Elgergawy, MD  potassium chloride SA (K-DUR,KLOR-CON) 20 MEQ tablet Take 1 tablet (20 mEq total) by mouth every other day. Take with torsemide. 10/12/15   Graciella Freer, PA-C  spironolactone (ALDACTONE) 25 MG tablet Take 0.5 tablets (12.5 mg total) by mouth daily. Patient taking differently: Take 12.5 mg by mouth 2 (two) times a week.  10/12/15   Graciella Freer, PA-C  torsemide (DEMADEX) 20 MG tablet Take 1 tablet (20 mg total) by mouth every other day. 10/12/15   Graciella Freer, PA-C  warfarin (COUMADIN) 2 MG tablet Take as directed by [facility]. 01/09/16   Laurey Morale, MD    Family History Family History  Problem Relation Age of Onset  . Hypertension Sister   . Diabetes Maternal Grandmother   . Cancer Mother     died young (pt was only in [employer] @ the time)  . Other  Father     died in his 37's.    Social History Social History  Substance Use Topics  . Smoking status: Never Smoker  . Smokeless tobacco: Never Used  . Alcohol use No     Allergies   Review of patient's allergies indicates no known allergies.   Review of Systems Review of Systems  Constitutional: Positive for chills and fatigue. Negative for appetite change and fever.  HENT: Positive for rhinorrhea. Negative for congestion.   Eyes: Negative for visual disturbance.  Respiratory: Positive for shortness of breath. Negative for chest tightness, wheezing and stridor.   Cardiovascular: Positive for syncope. Negative for chest pain and palpitations.  Gastrointestinal: Negative for abdominal pain, bowel incontinence, constipation, diarrhea, nausea and vomiting.  Genitourinary: Negative for dysuria and flank pain.    Musculoskeletal: Negative for back pain.  Skin: Negative for rash and wound.  Neurological: Positive for syncope and light-headedness. Negative for dizziness, focal weakness, seizures, numbness and headaches.  All other systems reviewed and are negative.    Physical Exam Updated Vital Signs BP 111/67 (BP Location: Left Arm)   Pulse 72   Resp 16   SpO2 99%   Physical Exam  Constitutional: She is oriented to person, place, and time. She appears well-developed and well-nourished. No distress.  HENT:  Head: Normocephalic and atraumatic.  Mouth/Throat: Oropharynx is clear and moist.  Eyes: Conjunctivae and EOM are normal. No scleral icterus.  Neck: Normal range of motion. Neck supple.  Cardiovascular: Normal rate and regular rhythm.   No murmur heard. Pulmonary/Chest: Effort normal and breath sounds normal. No stridor. No respiratory distress. She has no wheezes. She has no rales. She exhibits no tenderness and no crepitus.    Abdominal: Soft. There is no tenderness.  Musculoskeletal: She exhibits no edema.  Neurological: She is alert and oriented to person, place, and time. She has normal reflexes. She displays normal reflexes. No cranial nerve deficit. She exhibits normal muscle tone. Coordination normal.  Skin: Skin is warm and dry. Capillary refill takes less than 2 seconds. No rash noted.  Psychiatric: She has a normal mood and affect.  Nursing note and vitals reviewed.    ED Treatments / Results  Labs (all labs ordered are listed, but only abnormal results are displayed) Labs Reviewed  COMPREHENSIVE METABOLIC PANEL - Abnormal; Notable for the following:       Result Value   Creatinine, Ser 1.16 (*)    Total Protein 8.5 (*)    GFR calc non Af Amer 47 (*)    GFR calc Af Amer 54 (*)    All other components within normal limits  BRAIN NATRIURETIC PEPTIDE - Abnormal; Notable for the following:    B Natriuretic Peptide 1,300.0 (*)    All other components within normal  limits  I-STAT CG4 LACTIC ACID, ED - Abnormal; Notable for the following:    Lactic Acid, Venous 2.39 (*)    All other components within normal limits  URINE CULTURE  CBC WITH DIFFERENTIAL/PLATELET  PROTIME-INR  MAGNESIUM  URINALYSIS, ROUTINE W REFLEX MICROSCOPIC (NOT AT [facility])  BASIC METABOLIC PANEL  COOXEMETRY PANEL  COOXEMETRY PANEL  PROTIME-INR  CBC  I-STAT TROPOININ, ED  I-STAT CG4 LACTIC ACID, ED  I-STAT TROPOININ, ED    EKG  EKG Interpretation  Date/Time:  Monday January 09 2016 15:12:13 EDT Ventricular Rate:  82 PR Interval:    QRS Duration: 153 QT Interval:  460 QTC Calculation: 538 R Axis:   105 Text Interpretation:  Sinus  rhythm Consider left ventricular hypertrophy Prolonged QT interval Appears similar to prior NO STEMI Confirmed by [facility] LPEGELER MD, Ardell Makarewicz [phone]) on 01/09/2016 10:51:22 PM       Radiology Dg Chest 2 View  Result Date: 01/09/2016 CLINICAL DATA:  Shortness of breath and syncope EXAM: CHEST  2 VIEW COMPARISON:  October 10, 2015 FINDINGS: There is no edema or consolidation. There is cardiomegaly with pulmonary vascularity within normal limits. No adenopathy. Central catheter tip is at the cavoatrial junction, stable. No pneumothorax. No evident bone lesions. IMPRESSION: Stable cardiomegaly. No edema or consolidation. No evident adenopathy. Electronically Signed   By: Bretta BangWilliam  Woodruff III M.D.   On: 01/09/2016 15:06    Procedures Procedures (including critical care time)  Medications Ordered in ED Medications  amiodarone (PACERONE) tablet 200 mg (200 mg Oral Given 01/09/16 1847)  spironolactone (ALDACTONE) tablet 12.5 mg (12.5 mg Oral Given 01/09/16 1847)  magnesium oxide (MAG-OX) tablet 200 mg (not administered)  sodium chloride flush (NS) 0.9 % injection 3 mL (3 mLs Intravenous Given 01/09/16 2109)  sodium chloride flush (NS) 0.9 % injection 3 mL (not administered)  0.9 %  sodium chloride infusion (not administered)  acetaminophen (TYLENOL)  tablet 650 mg (not administered)  ondansetron (ZOFRAN) injection 4 mg (not administered)  Warfarin - Pharmacist Dosing Inpatient (not administered)  sodium chloride flush (NS) 0.9 % injection 10-40 mL (not administered)  milrinone (PRIMACOR) 20 MG/100 ML (0.2 mg/mL) infusion (0.25 mcg/kg/min  44.4 kg Intravenous New Bag/Given 01/09/16 2003)  warfarin (COUMADIN) tablet 5 mg (5 mg Oral Given 01/09/16 1848)     Initial Impression / Assessment and Plan / ED Course  I have reviewed the triage vital signs and the nursing notes.  Pertinent labs & imaging results that were available during my care of the patient were reviewed by me and considered in my medical decision making (see chart for details).  Clinical Course    Katrina Peck is a 70 y.o. female With a past medical history significant for hypertension, atrial fibrillation, CHF on continuous Milrinone therapy who presents for syncope.  History and exam are seen above. Patients exam was grossly unremarkable with clear lungs, nontender chest, and nontender abdomen. Neuro- exam is unremarkable.  EKG appeared similar to prior. Troponin nonelevated. Lactic acid slightly elevated at 2.39 which may suggest dehydration given decreased weight and recent diuretic use. Magnesium unremarkable, INR normal, And no evidence of infection or on CBC. Creatinine 1.16 which is slightly elevated from prior, concerning for AKI. BNP elevated that 1300Although this is not as high as prior.  Chest x-ray showed no evidence of edema or Consolidation.  Cardiology was called given multiple episodes of syncope in CHF patient. They will admit the patient for further management. Patient had no other problems or complications while in ED and was admitted in stable condition.  Final Clinical Impressions(s) / ED Diagnoses   Final diagnoses:  Syncope and collapse    Clinical Impression: 1. Syncope and collapse     Disposition: Admit to Cardiology service      Heide Scaleshristopher J Yailyn Strack, MD 01/09/16 2300

## 2016-01-09 NOTE — ED Triage Notes (Signed)
Pt brought in by EMS due to having a 10 to 15 second syncopal episode today. Pt denies nausea or vomiting. Per EMS pt was unresponsive and diaphoretic when they arrived. After a few minutes pt regained consciousness and was at baseline. Pt has home PICC line with milirone infusing. Dr. Shirlee Latch is her heart failure MD. Pt a&ox4.

## 2016-01-09 NOTE — ED Notes (Signed)
Report called  

## 2016-01-09 NOTE — H&P (Signed)
Advanced Heart Failure Team History and Physical Note   Primary Physician: Dr Duanne Guess Primary Cardiologist:  Dr Shirlee Latch  Reason for Admission: Syncope/Collapse   HPI:   70 y/o female with history of HTN who was admitted 07/20/14 with 3 months of exertional dyspnea, weight gain, cough, and orthopnea. Found to have EF 20% and a large pericardial effusion. Cath 5/16 no CAD. Hospital course complicated by atrial fibrillation w/ RVR, converted to NSR on amiodarone. Underwent pericardial window (transudate).    She had a repeat echo in 6/16 that showed persistently low LV EF at 15-20% with recurrence of moderate to large pericardial effusion without tamponade. This showed EF 10-15%, mild LV dilation, restrictive diastolic function, moderate to severely decreased RV function, and a moderate to large pericardial effusion similar to 6/16.   Admitted in 11/16 with marked volume overload. Diuresed with IV lasix. Discharged on milrinone 0.125 mcg. BB and ACE stopped. Discharge weight was 92 pounds.   Earlier today she being seen by [facility] nurse for routine blood work for home milrinone. During blood draw she was sweating and felt dizzy. Per [facility] nurse she collapsed and passed out. Says she woke up and EMS was in the house.  Last night she did not take amiodarone, not sure how compliant she has been with amiodarone over time. On Sunday she took torsemide 20 mg and her weight went from 101 to 97 pounds. Pertinent admission labs include: BNP 1300, lactic acid 2.3, hgb 13, K 5.1, Creatinine 1.16. CXR no evidence of edema.   Denies SOB/Orthopnea.     Review of Systems: [y] = yes, [ ]  = no   General: Weight gain [ ] ; Weight loss [ ] ; Anorexia [ ] ; Fatigue [ ] ; Fever [ ] ; Chills [ ] ; Weakness [Y ]  Cardiac: Chest pain/pressure [ ] ; Resting SOB [ ] ; Exertional SOB [ ] ; Orthopnea [ ] ; Pedal Edema [ ] ; Palpitations [ ] ; Syncope [ Y]; Presyncope [ ] ; Paroxysmal nocturnal dyspnea[ ]   Pulmonary: Cough [ ] ; Wheezing[ ] ;  Hemoptysis[ ] ; Sputum [ ] ; Snoring [ ]   GI: Vomiting[ ] ; Dysphagia[ ] ; Melena[ ] ; Hematochezia [ ] ; Heartburn[ ] ; Abdominal pain [ ] ; Constipation [ ] ; Diarrhea [ ] ; BRBPR [ ]   GU: Hematuria[ ] ; Dysuria [ ] ; Nocturia[ ]   Vascular: Pain in legs with walking [ ] ; Pain in feet with lying flat [ ] ; Non-healing sores [ ] ; Stroke [ ] ; TIA [ ] ; Slurred speech [ ] ;  Neuro: Headaches[ ] ; Vertigo[ ] ; Seizures[ ] ; Paresthesias[ ] ;Blurred vision [ ] ; Diplopia [ ] ; Vision changes [ ]   Ortho/Skin: Arthritis [ ] ; Joint pain [ Y]; Muscle pain [ ] ; Joint swelling [ ] ; Back Pain [ ] ; Rash [ ]   Psych: Depression[ ] ; Anxiety[ ]   Heme: Bleeding problems [ ] ; Clotting disorders [ ] ; Anemia [ ]   Endocrine: Diabetes [ ] ; Thyroid dysfunction[ ]   Home Medications Prior to Admission medications   Medication Sig Start Date End Date Taking? Authorizing Provider  amiodarone (PACERONE) 200 MG tablet Take 1 tablet (200 mg total) by mouth daily. 10/12/15  Yes Graciella Freer, PA-C  feeding supplement (BOOST / RESOURCE BREEZE) LIQD Take 1 Container by mouth 2 (two) times daily between meals. Patient taking differently: Take 1 Container by mouth daily.  10/12/15  Yes Starleen Arms, MD  magnesium oxide (MAG-OX) 400 MG tablet Take 200 mg by mouth daily as needed (leg cramping).   Yes Historical Provider, MD  milrinone (PRIMACOR) 20 MG/100 ML SOLN infusion  Inject 11.25 mcg/min into the vein continuous. 09/12/15  Yes Alison Murray, MD  oxyCODONE (OXY IR/ROXICODONE) 5 MG immediate release tablet Take 1 tablet (5 mg total) by mouth every 6 (six) hours as needed for moderate pain or severe pain. 10/12/15  Yes Leana Roe Elgergawy, MD  potassium chloride SA (K-DUR,KLOR-CON) 20 MEQ tablet Take 1 tablet (20 mEq total) by mouth every other day. Take with torsemide. 10/12/15  Yes Graciella Freer, PA-C  torsemide (DEMADEX) 20 MG tablet Take 1 tablet (20 mg total) by mouth every other day. 10/12/15  Yes Graciella Freer, PA-C   warfarin (COUMADIN) 2 MG tablet Take as directed by [facility]. Patient taking differently: Take 6-8 mg by mouth daily at 6 PM. Takes 6mg  on mon, wed and fri  Takes 8mg  all other days 01/09/16  Yes Laurey Morale, MD  spironolactone (ALDACTONE) 25 MG tablet Take 0.5 tablets (12.5 mg total) by mouth daily. Patient not taking: Reported on 01/09/2016 10/12/15   Graciella Freer, PA-C    Past Medical History: Past Medical History:  Diagnosis Date  . Anemia   . Bacteremia    a. MSSA bacteremia and discitis in 3/17, suspect tunneled catheter infection.   . Blood transfusion without reported diagnosis   . Chronic systolic CHF (congestive heart failure) (HCC)    a. 06/2014 Echo: EF 20%.  . Discitis   . Hypertension   . LV (left ventricular) mural thrombus    a. Noted on 12/16 echo.   Marland Kitchen NICM (nonischemic cardiomyopathy) (HCC)    a. 06/2014 Echo: EF 20%, diff HK, mild MR, mildly dil LA/RA, mildly reduced RV fxn;  b. 06/2014 Cath: nl cors.  . Noncompliance   . PAF (paroxysmal atrial fibrillation) (HCC)    a. with RVR, converted to NSR with amiodarone.  . Pericardial effusion    a. 06/2014 Large effusion, no tamponade, s/p window for diag/therap - neg malig. b. 6/16 - recurrence w/o tamponade, subsequently folloed by echoes. Mod 12/16.  Marland Kitchen Syncope 09/2015    Past Surgical History: Past Surgical History:  Procedure Laterality Date  . CARDIAC CATHETERIZATION N/A 02/07/2015   Procedure: Right Heart Cath;  Surgeon: Laurey Morale, MD;  Location: [facility] INVASIVE CV LAB;  Service: Cardiovascular;  Laterality: N/A;  . LEFT AND RIGHT HEART CATHETERIZATION WITH CORONARY ANGIOGRAM N/A 07/23/2014   Procedure: LEFT AND RIGHT HEART CATHETERIZATION WITH CORONARY ANGIOGRAM;  Surgeon: Laurey Morale, MD;  Location: [facility] CATH LAB;  Service: Cardiovascular;  Laterality: N/A;  . PERIPHERALLY INSERTED CENTRAL CATHETER INSERTION  10/10/2015  . PLEURAL EFFUSION DRAINAGE N/A 07/26/2014   Procedure: DRAINAGE OF  PERICARDIAL EFFUSION;  Surgeon: Delight Ovens, MD;  Location: [facility] OR;  Service: Thoracic;  Laterality: N/A;  . RADIOLOGY WITH ANESTHESIA N/A 06/08/2015   Procedure: MRI CERVICAL SPINE WITH/WITHOUT LUMBAR WITH/WITHOUT THORACIC WITH/WITHOUT       (RADIOLOGY WITH ANESTHESIA);  Surgeon: Medication Radiologist, MD;  Location: [facility] OR;  Service: Radiology;  Laterality: N/A;  . SUBXYPHOID PERICARDIAL WINDOW N/A 07/26/2014   Procedure: SUBXYPHOID PERICARDIAL WINDOW;  Surgeon: Delight Ovens, MD;  Location: [facility] OR;  Service: Thoracic;  Laterality: N/A;  . TEE WITHOUT CARDIOVERSION N/A 07/26/2014   Procedure: TRANSESOPHAGEAL ECHOCARDIOGRAM (TEE);  Surgeon: Delight Ovens, MD;  Location: [facility] OR;  Service: Thoracic;  Laterality: N/A;  . TEE WITHOUT CARDIOVERSION N/A 06/06/2015   Procedure: TRANSESOPHAGEAL ECHOCARDIOGRAM (TEE);  Surgeon: Dolores Patty, MD;  Location: [facility] ENDOSCOPY;  Service: Cardiovascular;  Laterality: N/A;  Family History: Family History  Problem Relation Age of Onset  . Hypertension Sister   . Diabetes Maternal Grandmother   . Cancer Mother     died young (pt was only in [employer] @ the time)  . Other Father     died in his 7980's.    Social History: Social History   Social History  . Marital status: Married    Spouse name: N/A  . Number of children: N/A  . Years of education: N/A   Social History Main Topics  . Smoking status: Never Smoker  . Smokeless tobacco: Never Used  . Alcohol use No  . Drug use: No  . Sexual activity: No   Other Topics Concern  . None   Social History Narrative   [place].  Not currently working.    Allergies:  No Known Allergies  Objective:    Vital Signs:   Pulse Rate:  [72-87] 80 (10/16 1600) Resp:  [14-23] 16 (10/16 1600) BP: (111-118)/(67-81) 118/77 (10/16 1600) SpO2:  [99 %-100 %] 100 % (10/16 1600)   There were no vitals filed for this visit.  Physical Exam: General:  Well appearing. No resp  difficulty HEENT: normal Neck: supple. JVP 7. Carotids 2+ bilat; no bruits. No lymphadenopathy or thryomegaly appreciated. Cor: PMI nondisplaced. Regular rate & rhythm. No rubs, or murmurs. + S3  R upper chest tunneled cath Lungs: clear Abdomen: soft, nontender, nondistended. No hepatosplenomegaly. No bruits or masses. Good bowel sounds. Extremities: no cyanosis, clubbing, rash, edema Neuro: alert & orientedx3, cranial nerves grossly intact. moves all 4 extremities w/o difficulty. Affect pleasant  Telemetry: Sinus Rhythm  LBBB 90s (no change from prior)  Labs: Basic Metabolic Panel:  Recent Labs Lab 01/09/16 1523  NA 141  K 5.1  CL 105  CO2 28  GLUCOSE 99  BUN 19  CREATININE 1.16*  CALCIUM 10.2  MG 2.0    Liver Function Tests:  Recent Labs Lab 01/09/16 1523  AST 32  ALT 27  ALKPHOS 94  BILITOT 1.2  PROT 8.5*  ALBUMIN 4.1   No results for input(s): LIPASE, AMYLASE in the last 168 hours. No results for input(s): AMMONIA in the last 168 hours.  CBC:  Recent Labs Lab 01/09/16 1523  WBC 9.1  NEUTROABS 7.4  HGB 13.0  HCT 40.5  MCV 84.4  PLT 211    Cardiac Enzymes: No results for input(s): CKTOTAL, CKMB, CKMBINDEX, TROPONINI in the last 168 hours.  BNP: BNP (last 3 results)  Recent Labs  03/22/15 2015 06/05/15 0330 01/09/16 1532  BNP >4,500.0* 3,619.4* 1,300.0*    ProBNP (last 3 results) No results for input(s): PROBNP in the last 8760 hours.   CBG: No results for input(s): GLUCAP in the last 168 hours.  Coagulation Studies:  Recent Labs  01/09/16 01/09/16 1523  LABPROT  --  14.8  INR 1.1 1.15    Other results: EKG:  Imaging: Dg Chest 2 View  Result Date: 01/09/2016 CLINICAL DATA:  Shortness of breath and syncope EXAM: CHEST  2 VIEW COMPARISON:  October 10, 2015 FINDINGS: There is no edema or consolidation. There is cardiomegaly with pulmonary vascularity within normal limits. No adenopathy. Central catheter tip is at the cavoatrial  junction, stable. No pneumothorax. No evident bone lesions. IMPRESSION: Stable cardiomegaly. No edema or consolidation. No evident adenopathy. Electronically Signed   By: Bretta BangWilliam  Woodruff III M.D.   On: 01/09/2016 15:06         Assessment/Plan/Discusson  1. Syncope: 10/16./17, prior episode 7/17. ? Vasovagal versus VT/VF versus bradyarrhythmia (baseline LBBB).  She was put on amiodarone after last syncopal episode, but not sure she has been compliant with it.  Very possible this was vagal given circumstances (prodrome of diaphoresis, occurred after multiple attempts at blood draw). - Generally would be poor candidate for ICD given home milrinone, but CRT-D may improve heart function, ?allow stopping milrinone given long LBBB.  - Would favor either LINQ or 30 day monitor given 2 recent syncopal episodes.  - Needs to take amiodarone, discussed this with her.  - No driving x 6 months.    - Will ask EP to see regarding the above.  2. Chronic systolic CHF: Nonischemic cardiomyopathy.  LHC/RHC (4/16) with no significant coronary disease; mean RA 5, PA 28/11, mean PCWP 9, CI 3.39.  She is now on home milrinone 0.25 mcg. Volume status stable.  Echo (6/17) with EF 10%, small-moderate pericardial effusion.  End stage CHF on home milrinone.  She has had trouble following cardiology recommendations => poor compliance.  Has refused CRT-D in the past.  Not currently LVAD candidate given compliance issues.  - Continue home milrinone.  Has not been able to titrate off.  - No BB with low output. No arb with hypotension.    - See discussion above regarding CRT-D.  3. Pericardial effusion: Large on 4/16 echo.  Patient had pericardial window for diagnostic and therapeutic purposes.  Cytology negative for malignancy.  6/16 echo with recurrence of moderate-large pericardial effusion without tamponade, similar repeat echo in 7/16, effusion slightly smaller in 8/16 by echo and MRI.  Moderate effusion on 12/16 echo.   Small to moderate pericardial effusion on 6/17 echo. Repeat ECHO this admission..  4. Atrial fibrillation: Atrial fibrillation with RVR, converted to NSR with amiodarone.   5. LV thrombus: Continue coumadin. Pharmacy to dose. Noted on 12/16 echo.   Difficult to manage with medication noncompliance.   [name] 01/09/2016 4:58 PM       Length of Stay: 0   Amy Clegg 01/09/2016, 4:36 PM  Advanced Heart Failure Team Pager [phone] (M-F; 7a - 4p)  Please contact [facility] for night-coverage after hours (4p -7a ) and weekends on [email or web address]

## 2016-01-09 NOTE — Progress Notes (Signed)
ANTICOAGULATION CONSULT NOTE - Initial Consult  Pharmacy Consult for Warfarin Indication: LV Thrombus  No Known Allergies  Patient Measurements: Height: 5\' 3"  (160 cm) Weight: 97 lb 14.2 oz (44.4 kg) IBW/kg (Calculated) : 52.4  Vital Signs: Temp: 98.5 F (36.9 C) (10/16 1818) Temp Source: Oral (10/16 1818) BP: 123/66 (10/16 1818) Pulse Rate: 96 (10/16 1818)  Labs:  Recent Labs  01/09/16 01/09/16 1523  HGB  --  13.0  HCT  --  40.5  PLT  --  211  LABPROT  --  14.8  INR 1.1 1.15  CREATININE  --  1.16*   Estimated Creatinine Clearance: 32.1 mL/min (by C-G formula based on SCr of 1.16 mg/dL (H)).  Medical History: Past Medical History:  Diagnosis Date  . Anemia   . Bacteremia    a. MSSA bacteremia and discitis in 3/17, suspect tunneled catheter infection.   . Blood transfusion without reported diagnosis   . Chronic systolic CHF (congestive heart failure) (HCC)    a. 06/2014 Echo: EF 20%.  . Discitis   . Hypertension   . LV (left ventricular) mural thrombus    a. Noted on 12/16 echo.   Marland Kitchen. NICM (nonischemic cardiomyopathy) (HCC)    a. 06/2014 Echo: EF 20%, diff HK, mild MR, mildly dil LA/RA, mildly reduced RV fxn;  b. 06/2014 Cath: nl cors.  . Noncompliance   . PAF (paroxysmal atrial fibrillation) (HCC)    a. with RVR, converted to NSR with amiodarone.  . Pericardial effusion    a. 06/2014 Large effusion, no tamponade, s/p window for diag/therap - neg malig. b. 6/16 - recurrence w/o tamponade, subsequently folloed by echoes. Mod 12/16.  Marland Kitchen. Syncope 09/2015    Medications:  Prescriptions Prior to Admission  Medication Sig Dispense Refill Last Dose  . amiodarone (PACERONE) 200 MG tablet Take 1 tablet (200 mg total) by mouth daily. 30 tablet 6 01/08/2016 at Unknown time  . feeding supplement (BOOST / RESOURCE BREEZE) LIQD Take 1 Container by mouth 2 (two) times daily between meals. (Patient taking differently: Take 1 Container by mouth daily. )  0 01/08/2016 at Unknown time   . magnesium oxide (MAG-OX) 400 MG tablet Take 200 mg by mouth daily as needed (leg cramping).   Past Month at Unknown time  . milrinone (PRIMACOR) 20 MG/100 ML SOLN infusion Inject 11.25 mcg/min into the vein continuous. 11.25 mL 0 01/09/2016 at Unknown time  . oxyCODONE (OXY IR/ROXICODONE) 5 MG immediate release tablet Take 1 tablet (5 mg total) by mouth every 6 (six) hours as needed for moderate pain or severe pain. 1 tablet 0 Past Month at Unknown time  . potassium chloride SA (K-DUR,KLOR-CON) 20 MEQ tablet Take 1 tablet (20 mEq total) by mouth every other day. Take with torsemide. 18 tablet 6 Past Month at Unknown time  . torsemide (DEMADEX) 20 MG tablet Take 1 tablet (20 mg total) by mouth every other day. 18 tablet 6 01/08/2016 at Unknown time  . warfarin (COUMADIN) 2 MG tablet Take as directed by [facility]. (Patient taking differently: Take 6-8 mg by mouth daily at 6 PM. Takes 6mg  on mon, wed and fri  Takes 8mg  all other days) 110 tablet 2 01/08/2016 at Unknown time  . spironolactone (ALDACTONE) 25 MG tablet Take 0.5 tablets (12.5 mg total) by mouth daily. (Patient not taking: Reported on 01/09/2016) 18 tablet 6 Not Taking at Unknown time   Assessment: 70 y/o female with history of HTN and atrial fibrillation. Pharmacy has been consulted  to dose warfarin. HgB, Plt stable; INR 1.15 on admission. Spoke with on call provider about bridge therapy, but he declined at this time. Patient has been in normal sinus rhythm. Concerns for medication compliance, will dose conservatively.   Goal of Therapy:  INR 2-3 Monitor platelets by anticoagulation protocol: Yes   Plan:  Warfarin 5mg  x1 tonight Daily CBC, INR Monitor S/Sx of bleeding  Earnest Mcgillis L Danita Proud 01/09/2016,6:37 PM

## 2016-01-10 ENCOUNTER — Inpatient Hospital Stay (HOSPITAL_COMMUNITY): Payer: Medicare Other

## 2016-01-10 DIAGNOSIS — R55 Syncope and collapse: Secondary | ICD-10-CM

## 2016-01-10 DIAGNOSIS — I509 Heart failure, unspecified: Secondary | ICD-10-CM

## 2016-01-10 LAB — URINALYSIS, ROUTINE W REFLEX MICROSCOPIC
Bilirubin Urine: NEGATIVE
GLUCOSE, UA: NEGATIVE mg/dL
HGB URINE DIPSTICK: NEGATIVE
KETONES UR: NEGATIVE mg/dL
Nitrite: NEGATIVE
PROTEIN: NEGATIVE mg/dL
Specific Gravity, Urine: 1.02 (ref 1.005–1.030)
pH: 5.5 (ref 5.0–8.0)

## 2016-01-10 LAB — BASIC METABOLIC PANEL
ANION GAP: 8 (ref 5–15)
BUN: 20 mg/dL (ref 6–20)
CALCIUM: 9.7 mg/dL (ref 8.9–10.3)
CO2: 27 mmol/L (ref 22–32)
Chloride: 104 mmol/L (ref 101–111)
Creatinine, Ser: 1.11 mg/dL — ABNORMAL HIGH (ref 0.44–1.00)
GFR, EST AFRICAN AMERICAN: 57 mL/min — AB (ref >60)
GFR, EST NON AFRICAN AMERICAN: 49 mL/min — AB (ref >60)
GLUCOSE: 104 mg/dL — AB (ref 65–99)
Potassium: 4.2 mmol/L (ref 3.5–5.1)
Sodium: 139 mmol/L (ref 135–145)

## 2016-01-10 LAB — URINE MICROSCOPIC-ADD ON: RBC / HPF: NONE SEEN RBC/hpf (ref 0–5)

## 2016-01-10 LAB — CBC
HCT: 39.4 % (ref 36.0–46.0)
Hemoglobin: 12.6 g/dL (ref 12.0–15.0)
MCH: 27.2 pg (ref 26.0–34.0)
MCHC: 32 g/dL (ref 30.0–36.0)
MCV: 84.9 fL (ref 78.0–100.0)
PLATELETS: 218 10*3/uL (ref 150–400)
RBC: 4.64 MIL/uL (ref 3.87–5.11)
RDW: 15.7 % — AB (ref 11.5–15.5)
WBC: 6.8 10*3/uL (ref 4.0–10.5)

## 2016-01-10 LAB — ECHOCARDIOGRAM COMPLETE
HEIGHTINCHES: 63 in
Weight: 1556.8 oz

## 2016-01-10 LAB — PROTIME-INR
INR: 1.23
PROTHROMBIN TIME: 15.5 s — AB (ref 11.4–15.2)

## 2016-01-10 MED ORDER — HEPARIN (PORCINE) IN NACL 100-0.45 UNIT/ML-% IJ SOLN
1000.0000 [IU]/h | INTRAMUSCULAR | Status: DC
  Administered 2016-01-10: 1000 [IU]/h via INTRAVENOUS
  Filled 2016-01-10: qty 250

## 2016-01-10 MED ORDER — WARFARIN SODIUM 5 MG PO TABS: 7.0000 mg | ORAL_TABLET | Freq: Once | ORAL | Status: DC

## 2016-01-10 MED ORDER — HEPARIN BOLUS VIA INFUSION
2500.0000 [IU] | Freq: Once | INTRAVENOUS | Status: AC
  Administered 2016-01-10: 2500 [IU] via INTRAVENOUS
  Filled 2016-01-10: qty 2500

## 2016-01-10 NOTE — Progress Notes (Signed)
[facility]  Active pt with [facility] prior to this hospitalization  [facility] is providing [facility] for home Milrinone.  [facility] hospital team will follow Ms. Roughton this admission to support transition home when ordered.   If patient discharges after hours, please call [phone].   Sedalia Muta 01/10/2016, 9:49 AM

## 2016-01-10 NOTE — Progress Notes (Signed)
  Echocardiogram 2D Echocardiogram has been performed.  Katrina Peck 01/10/2016, 11:55 AM

## 2016-01-10 NOTE — Progress Notes (Signed)
Advanced Heart Failure Rounding Note  Primary Physician: Dr Duanne Guess Primary Cardiologist:  Dr Shirlee Latch  Reason for Admission: Syncope/Collapse  Subjective:    Katrina Peck is a 70 y.o. female on home milrinone for chronic systolic HF. Collapsed and passed out at home 01/09/16. Brought in by EMS.  ?Vasovagal vs VT/VF with questionable compliance with her amiodarone.   Feeling good today. Denies CP or SOB.  No palpitations. Willing to consider CRT-P.   Weight and creatinine stable.  Objective:   Weight Range: 97 lb 4.8 oz (44.1 kg) Body mass index is 17.24 kg/m.   Vital Signs:   Temp:  [98.1 F (36.7 C)-98.5 F (36.9 C)] 98.1 F (36.7 C) (10/17 0423) Pulse Rate:  [72-96] 87 (10/17 0423) Resp:  [14-23] 18 (10/17 0423) BP: (102-123)/(64-88) 102/66 (10/17 0423) SpO2:  [99 %-100 %] 100 % (10/17 0423) Weight:  [97 lb 4.8 oz (44.1 kg)-97 lb 14.2 oz (44.4 kg)] 97 lb 4.8 oz (44.1 kg) (10/17 0423) Last BM Date: 01/09/16  Weight change: Filed Weights   01/09/16 1818 01/10/16 0423  Weight: 97 lb 14.2 oz (44.4 kg) 97 lb 4.8 oz (44.1 kg)    Intake/Output:   Intake/Output Summary (Last 24 hours) at 01/10/16 0802 Last data filed at 01/10/16 0700  Gross per 24 hour  Intake           516.14 ml  Output              200 ml  Net           316.14 ml     Physical Exam: General:  Elderly and chronically ill appearing.  HEENT: Normal Neck: supple. JVP 6-7 cm. Carotids 2+ bilat; no bruits. No thyromegaly or nodule noted. Cor: PMI nondisplaced. RRR. No rubs, or murmurs. + S3  R upper chest tunneled cath Lungs: CTAB, normal effort Abdomen: soft, NT, ND, no HSM. No bruits or masses. +BS  Extremities: no cyanosis, clubbing, rash, edema Neuro: alert & orientedx3, cranial nerves grossly intact. moves all 4 extremities w/o difficulty. Affect pleasant  Telemetry: Reviewed personally, NSR 90s up to Sinus Tach 100s, + LBBB (Chronic)  Labs: CBC  Recent Labs  01/09/16 1523  01/10/16 0303  WBC 9.1 6.8  NEUTROABS 7.4  --   HGB 13.0 12.6  HCT 40.5 39.4  MCV 84.4 84.9  PLT 211 218   Basic Metabolic Panel  Recent Labs  01/09/16 1523 01/10/16 0303  NA 141 139  K 5.1 4.2  CL 105 104  CO2 28 27  GLUCOSE 99 104*  BUN 19 20  CREATININE 1.16* 1.11*  CALCIUM 10.2 9.7  MG 2.0  --    Liver Function Tests  Recent Labs  01/09/16 1523  AST 32  ALT 27  ALKPHOS 94  BILITOT 1.2  PROT 8.5*  ALBUMIN 4.1   No results for input(s): LIPASE, AMYLASE in the last 72 hours. Cardiac Enzymes No results for input(s): CKTOTAL, CKMB, CKMBINDEX, TROPONINI in the last 72 hours.  BNP: BNP (last 3 results)  Recent Labs  03/22/15 2015 06/05/15 0330 01/09/16 1532  BNP >4,500.0* 3,619.4* 1,300.0*    ProBNP (last 3 results) No results for input(s): PROBNP in the last 8760 hours.   D-Dimer No results for input(s): DDIMER in the last 72 hours. Hemoglobin A1C No results for input(s): HGBA1C in the last 72 hours. Fasting Lipid Panel No results for input(s): CHOL, HDL, LDLCALC, TRIG, CHOLHDL, LDLDIRECT in the last 72 hours. Thyroid Function Tests  No results for input(s): TSH, T4TOTAL, T3FREE, THYROIDAB in the last 72 hours.  Invalid input(s): FREET3  Other results:     Imaging/Studies:  Dg Chest 2 View  Result Date: 01/09/2016 CLINICAL DATA:  Shortness of breath and syncope EXAM: CHEST  2 VIEW COMPARISON:  October 10, 2015 FINDINGS: There is no edema or consolidation. There is cardiomegaly with pulmonary vascularity within normal limits. No adenopathy. Central catheter tip is at the cavoatrial junction, stable. No pneumothorax. No evident bone lesions. IMPRESSION: Stable cardiomegaly. No edema or consolidation. No evident adenopathy. Electronically Signed   By: Bretta BangWilliam  Woodruff III M.D.   On: 01/09/2016 15:06     Latest Echo  Latest Cath   Medications:     Scheduled Medications: . amiodarone  200 mg Oral Daily  . sodium chloride flush  3 mL  Intravenous Q12H  . spironolactone  12.5 mg Oral Daily  . Warfarin - Pharmacist Dosing Inpatient   Does not apply q1800     Infusions: . milrinone 0.25 mcg/kg/min (01/09/16 2003)     PRN Medications:  sodium chloride, acetaminophen, magnesium oxide, ondansetron (ZOFRAN) IV, sodium chloride flush, sodium chloride flush   Assessment/Plan   Katrina Peck is a 70 y.o. female on home milrinone for chronic systolic HF. Collapsed and passed out at home 01/09/16. Brought in by EMS.  ?Vasovagal vs VT/VF with questionable compliance with her amiodarone.    1. Syncope: 10/16./17, prior episode 7/17. ? Vasovagal versus VT/VF versus bradyarrhythmia (baseline LBBB).   - Has had questionable compliance with her amiodarone.  - ? Vagal episode with diaphoresis and multiple attempts at blood draw leading up to.  - Generally would be poor candidate for ICD given home milrinone, but CRT-P may improve heart function, ?allow stopping milrinone given long LBBB.  - Would favor either LINQ or 30 day monitor given 2 recent syncopal episodes.  - Continue amiodarone 200 mg daily.  Have discussed importance.  - No driving x 6 months.    - Will ask EP to see regarding the above.  2. Chronic systolic CHF: Nonischemic cardiomyopathy. LHC/RHC (4/16) with no significant coronary disease; mean RA 5, PA 28/11, mean PCWP 9, CI 3.39. She is now on home milrinone 0.25 mcg.  Echo (6/17) with EF 10%, small-moderate pericardial effusion.  - Repeat Echo pending. - Volume status stable to dry.  - Continue milrinone 0.25 mcg/kg/min. Have not been able to titrate down.  - No BB with low output. No ARB with hypotension.  - Want EP to see for possible CRT-P with LBBB. Has previously refused. - She is NOT an LVAD candidate given compliance issues.  3. Pericardial effusion:  - History of recurrence s/p pericardial window.  - Small to moderate pericardial effusion on 6/17 echo.  - Repeat ECHO pending.  4. Atrial  fibrillation: Atrial fibrillation with RVR, converted to NSR with amiodarone.  - INR 1.2 this am.  Very likely she is non-compliant with her Coumadin.  5. LV thrombus: Continue coumadin. Pharmacy to dose. Noted on 12/16 echo.   Length of Stay: 1   Graciella FreerMichael Andrew Tillery PA-C 01/10/2016, 8:02 AM   Patient seen with PA, agree with the above note.  Echo reviewed, EF remains 15% with small to moderate pericardial effusion. No events on telemetry.  Seen by EP.    Suspect most likely vasovagal syncope, however cannot rule out ventricular arrhythmia.  - She has not been compliant with amiodarone, asked her to restart this.  - No driving x  6 months.   She may benefit from CRT-P (not defibrillator candidate) given her wide LBBB.  There is a chance this could allow her to stop milrinone.  She is willing to think about it. Will see Dr Elberta Fortis in followup to discuss.   INR is low, not sure how compliant she has been with coumadin.  No LV thrombus noted on echo today (on warfarin because of LV thrombus).  She declines Lovenox bridge.   She can go home today, followup with me and with EP ([place]).   Advanced Heart Failure Team Pager [phone] (M-F; 7a - 4p)  Please contact [facility] for night-coverage after hours (4p -7a ) and weekends on [email or web address]

## 2016-01-10 NOTE — Discharge Instructions (Signed)

## 2016-01-10 NOTE — Discharge Summary (Signed)
Advanced Heart Failure Team  Discharge Summary   Patient ID: Katrina Peck MRN: [id number], DOB/AGE: 69-24-1947 69 y.o. Admit date: 01/09/2016 D/C date:     01/10/2016   Primary Discharge Diagnoses:  1. Syncope  2. Chronic Systolic Heart Failure 3. Pericardial Effusion 4. A fib  5. LV thrombus  Hospital Course:  Katrina SchneidersBetty J Lett is a 70 y.o. female on home milrinone for chronic systolic HF. Collapsed and passed out at home 01/09/16. Brought in by EMS.  ?Vasovagal vs VT/VF with questionable compliance with her amiodarone. [facility] for home milrinone. During blood draw she was sweating and felt dizzy. [facility] and [facility]. Says she woke up and EMS was in the house. Observed over night and discharging to home in stable condition.    1. Syncope: 10/16./17, prior episode 7/17. ? Vasovagal versus VT/VF versus bradyarrhythmia (baseline LBBB).  She was put on amiodarone after last syncopal episode, but not sure she has been compliant with it.  Very possible this was vagal given circumstances (prodrome of diaphoresis, occurred after multiple attempts at blood draw). - Generally would be poor candidate for ICD given home milrinone, but CRT-D may improve heart function, ?allow stopping milrinone given long LBBB.  - Would favor either LINQ or 30 day monitor given 2 recent syncopal episodes.  - Needs to take amiodarone, discussed this with her.  - No driving x 6 months.    - Referred to EP for LINQ verus CRT-D however she refused. She was agreeable to outpatient EP follow and this was set up.  2. Chronic systolic CHF: Nonischemic cardiomyopathy. LHC/RHC (4/16) with no significant coronary disease; mean RA 5, PA 28/11, mean PCWP 9, CI 3.39. She is now on home milrinone 0.25 mcg. Volume status stable.  Echo (6/17) with EF 10%, small-moderate pericardial effusion.  End stage CHF on home milrinone.  She has had trouble following cardiology  recommendations => poor compliance.  Has refused CRT-D in the past.  Not currently LVAD candidate given compliance issues.  - Continue home milrinone 0..25 mcg. [facility] will resume care.   - No BB with low output. No arb with hypotension.    - See discussion above regarding CRT-D.  3. Pericardial effusion: Large on 4/16 echo. Patient had pericardial window for diagnostic and therapeutic purposes. Cytology negative for malignancy. 6/16 echo with recurrence of moderate-large pericardial effusion without tamponade, similar repeat echo in 7/16, effusion slightly smaller in 8/16 by echo and MRI. Moderate effusion on 12/16 echo. Small to moderate pericardial effusion on 6/17 echo.  ECHO 01/10/2016 EF 15%. Moderate pericardial effusion. Reviewed by Dr Shirlee LatchMcLean with no change from previous.   4. Atrial fibrillation: Atrial fibrillation with RVR, converted to NSR with amiodarone. On coumadin. Subtherapeutic due to noncompliance 5. LV thrombus: Continue coumadin. INR on admit low. Refuses lovenox. .  EP consulted for possible LINQ versus CRT-D however she declined. She agreed to follow up with Dr Elberta Fortisamnitz as an outpatient. Repeat ECHO showed EF 15% and moderate pericardial effusion.   Follow up in the HF clinic next week. [facility] to resume care for home milrinone.    Discharge Weight: 97 pounds   Discharge Vitals: Blood pressure 102/66, pulse 87, temperature 98.1 F (36.7 C), temperature source Oral, resp. rate 18, height 5\' 3"  (1.6 m), weight 97 lb 4.8 oz (44.1 kg), SpO2 100 %.  Labs: Lab Results  Component Value Date   WBC 6.8 01/10/2016  HGB 12.6 01/10/2016   HCT 39.4 01/10/2016   MCV 84.9 01/10/2016   PLT 218 01/10/2016    Recent Labs Lab 01/09/16 1523 01/10/16 0303  NA 141 139  K 5.1 4.2  CL 105 104  CO2 28 27  BUN 19 20  CREATININE 1.16* 1.11*  CALCIUM 10.2 9.7  PROT 8.5*  --   BILITOT 1.2  --   ALKPHOS 94  --   ALT 27  --   AST 32  --   GLUCOSE 99 104*   No results found for:  CHOL, HDL, LDLCALC, TRIG BNP (last 3 results)  Recent Labs  03/22/15 2015 06/05/15 0330 01/09/16 1532  BNP >4,500.0* 3,619.4* 1,300.0*    ProBNP (last 3 results) No results for input(s): PROBNP in the last 8760 hours.   Diagnostic Studies/Procedures   Dg Chest 2 View  Result Date: 01/09/2016 CLINICAL DATA:  Shortness of breath and syncope EXAM: CHEST  2 VIEW COMPARISON:  October 10, 2015 FINDINGS: There is no edema or consolidation. There is cardiomegaly with pulmonary vascularity within normal limits. No adenopathy. Central catheter tip is at the cavoatrial junction, stable. No pneumothorax. No evident bone lesions. IMPRESSION: Stable cardiomegaly. No edema or consolidation. No evident adenopathy. Electronically Signed   By: Bretta Bang III M.D.   On: 01/09/2016 15:06    Discharge Medications     Medication List    TAKE these medications   amiodarone 200 MG tablet Commonly known as:  PACERONE Take 1 tablet (200 mg total) by mouth daily.   feeding supplement Liqd Take 1 Container by mouth 2 (two) times daily between meals. What changed:  when to take this   magnesium oxide 400 MG tablet Commonly known as:  MAG-OX Take 200 mg by mouth daily as needed (leg cramping).   milrinone 20 MG/100 ML Soln infusion Commonly known as:  PRIMACOR Inject 11.25 mcg/min into the vein continuous.   oxyCODONE 5 MG immediate release tablet Commonly known as:  Oxy IR/ROXICODONE Take 1 tablet (5 mg total) by mouth every 6 (six) hours as needed for moderate pain or severe pain.   potassium chloride SA 20 MEQ tablet Commonly known as:  K-DUR,KLOR-CON Take 1 tablet (20 mEq total) by mouth every other day. Take with torsemide.   spironolactone 25 MG tablet Commonly known as:  ALDACTONE Take 0.5 tablets (12.5 mg total) by mouth daily.   torsemide 20 MG tablet Commonly known as:  DEMADEX Take 1 tablet (20 mg total) by mouth every other day.   warfarin 2 MG tablet Commonly known  as:  COUMADIN Take as directed by Coumadin Clinic. What changed:  how much to take  how to take this  when to take this  additional instructions       Disposition   The patient will be discharged in stable condition to home. Discharge Instructions    Diet - low sodium heart healthy    Complete by:  As directed    Discharge instructions    Complete by:  As directed    Do not drive for 6 months   Heart Failure patients record your daily weight using the same scale at the same time of day    Complete by:  As directed    Increase activity slowly    Complete by:  As directed      Follow-up Information    [facility] .   Why:  They will continue to do your home health care from  your home Contact information: [address] [place] [address] [phone]        Will [name], MD Follow up on 01/24/2016.   Specialty:  Cardiology Why:  at 10:45AM Contact information: [address] [place] [address] [phone]        Tonye Becket, NP Follow up on 01/19/2016.   Specialty:  Cardiology Why:  at 10:20 [address] 4000 Contact information: [address] [place] [address] [phone]             Duration of Discharge Encounter: Greater than 35 minutes   Signed, Amy Clegg  01/10/2016, 4:09 PM

## 2016-01-10 NOTE — Progress Notes (Signed)
  Echocardiogram 2D Echocardiogram has been performed.  Arvil Chaco 01/10/2016, 11:14 AM

## 2016-01-10 NOTE — Progress Notes (Signed)
ANTICOAGULATION CONSULT NOTE - Follow Up Consult  Pharmacy Consult for Heparin and Coumadin Indication: atrial fibrillation and hx LV thrombus  No Known Allergies  Patient Measurements: Height: 5\' 3"  (160 cm) Weight: 97 lb 4.8 oz (44.1 kg) (scale b) IBW/kg (Calculated) : 52.4  Vital Signs: Temp: 98.1 F (36.7 C) (10/17 0423) Temp Source: Oral (10/17 0423) BP: 102/66 (10/17 0423) Pulse Rate: 87 (10/17 0423)  Labs:  Recent Labs  01/09/16 01/09/16 1523 01/10/16 0303  HGB  --  13.0 12.6  HCT  --  40.5 39.4  PLT  --  211 218  LABPROT  --  14.8 15.5*  INR 1.1 1.15 1.23  CREATININE  --  1.16* 1.11*    Estimated Creatinine Clearance: 33.3 mL/min (by C-G formula based on SCr of 1.11 mg/dL (H)).  Assessment: 70yof on coumadin for afib and hx LV thrombus. She had her INR checked yesterday by [facility] - it was 1.1 and she was told to take 10mg  then resume her usual dose of 8mg  daily except 6mg  MWF. She never took the 10mg  but instead was admitted and received 5mg  last night. INR today remains below goal 1.2 so heparin bridge will be added. She admits to noncompliance and missing doses of coumadin.  Goal of Therapy:  INR 2-3 Heparin level 0.3-0.7 units/ml Monitor platelets by anticoagulation protocol: Yes   Plan:  1) Heparin bolus 2500 units x 1 2) Heparin drip at 1000 units/hr 3) 6 hour heparin level 4) Coumadin 7mg  x 1 5) Daily heparin level, INR, CBC  Fredrik Rigger 01/10/2016,9:17 AM

## 2016-01-10 NOTE — Progress Notes (Signed)
Pt has orders to be discharged. Discharge instructions given and pt has no additional questions at this time. Medication regimen reviewed and pt educated. Pt verbalized understanding and has no additional questions. Telemetry box removed. IV removed and site in good condition. Pt stable and waiting for transportation.  Elester Apodaca RN 

## 2016-01-10 NOTE — Consult Note (Signed)
ELECTROPHYSIOLOGY CONSULT NOTE    Patient ID: Katrina Peck MRN: [id number], DOB/AGE: October 11, 1945 70 y.o.  Admit date: 01/09/2016 Date of Consult: 01/10/2016  Primary Physician: Maryelizabeth RowanEWEY,ELIZABETH, MD Primary Cardiologist: Shirlee LatchMcLean  Reason for Consultation: syncope, LBBB, CHF  HPI:  Katrina Peck is a 70 y.o. female with a past medical history significant for non ischemic cardiomyopathy, chronic systolic heart failure (on Milrinone), recurrent pericardial effusion, LBBB who presented to the ER for evaluation of syncope. Katrina was having her blood drawn at home when Katrina got diaphoretic and hot and had a syncopal spell. Katrina had another episode earlier this summer where Katrina had been outside in the heat and became diaphoretic and hot and had a similar episode.    Echo 08/2015 demonstrated EF 10%, diffuse hypokinesis, small to moderate pericardial effusion, mild MR.   Katrina currently denies chest pain, shortness of breath, LE edema, recent fevers, chills, nausea or vomiting. Katrina does not have palpitations.  EP has been asked to evaluate for treatment options.   Past Medical History:  Diagnosis Date  . Anemia   . Bacteremia    a. MSSA bacteremia and discitis in 3/17, suspect tunneled catheter infection.   . Blood transfusion without reported diagnosis   . Chronic systolic CHF (congestive heart failure) (HCC)    a. 06/2014 Echo: EF 20%.  . Discitis   . Hypertension   . LV (left ventricular) mural thrombus    a. Noted on 12/16 echo.   Marland Kitchen. NICM (nonischemic cardiomyopathy) (HCC)    a. 06/2014 Echo: EF 20%, diff HK, mild MR, mildly dil LA/RA, mildly reduced RV fxn;  b. 06/2014 Cath: nl cors.  . Noncompliance   . PAF (paroxysmal atrial fibrillation) (HCC)    a. with RVR, converted to NSR with amiodarone.  . Pericardial effusion    a. 06/2014 Large effusion, no tamponade, s/p window for diag/therap - neg malig. b. 6/16 - recurrence w/o tamponade, subsequently folloed by echoes. Mod 12/16.  Marland Kitchen.  Syncope 09/2015     Surgical History:  Past Surgical History:  Procedure Laterality Date  . CARDIAC CATHETERIZATION N/A 02/07/2015   Procedure: Right Heart Cath;  Surgeon: Laurey Moralealton S McLean, MD;  [facility];  Service: Cardiovascular;  Laterality: N/A;  . LEFT AND RIGHT HEART CATHETERIZATION WITH CORONARY ANGIOGRAM N/A 07/23/2014   Procedure: LEFT AND RIGHT HEART CATHETERIZATION WITH CORONARY ANGIOGRAM;  Surgeon: Laurey Moralealton S McLean, MD;  [facility];  Service: Cardiovascular;  Laterality: N/A;  . PERIPHERALLY INSERTED CENTRAL CATHETER INSERTION  10/10/2015  . PLEURAL EFFUSION DRAINAGE N/A 07/26/2014   Procedure: DRAINAGE OF PERICARDIAL EFFUSION;  Surgeon: Delight OvensEdward B Gerhardt, MD;  [facility];  Service: Thoracic;  Laterality: N/A;  . RADIOLOGY WITH ANESTHESIA N/A 06/08/2015   Procedure: MRI CERVICAL SPINE WITH/WITHOUT LUMBAR WITH/WITHOUT THORACIC WITH/WITHOUT       (RADIOLOGY WITH ANESTHESIA);  Surgeon: Medication Radiologist, MD;  Location: [facility] OR;  Service: Radiology;  Laterality: N/A;  . SUBXYPHOID PERICARDIAL WINDOW N/A 07/26/2014   Procedure: SUBXYPHOID PERICARDIAL WINDOW;  Surgeon: Delight OvensEdward B Gerhardt, MD;  [facility];  Service: Thoracic;  Laterality: N/A;  . TEE WITHOUT CARDIOVERSION N/A 07/26/2014   Procedure: TRANSESOPHAGEAL ECHOCARDIOGRAM (TEE);  Surgeon: Delight OvensEdward B Gerhardt, MD;  [facility];  Service: Thoracic;  Laterality: N/A;  . TEE WITHOUT CARDIOVERSION N/A 06/06/2015   Procedure: TRANSESOPHAGEAL ECHOCARDIOGRAM (TEE);  Surgeon: Dolores Pattyaniel R Bensimhon, MD;  [facility];  Service: Cardiovascular;  Laterality: N/A;  Prescriptions Prior to Admission  Medication Sig Dispense Refill Last Dose  . amiodarone (PACERONE) 200 MG tablet Take 1 tablet (200 mg total) by mouth daily. 30 tablet 6 01/08/2016 at Unknown time  . feeding supplement (BOOST / RESOURCE BREEZE) LIQD Take 1 Container by mouth 2 (two) times daily between meals. (Patient taking differently:  Take 1 Container by mouth daily. )  0 01/08/2016 at Unknown time  . magnesium oxide (MAG-OX) 400 MG tablet Take 200 mg by mouth daily as needed (leg cramping).   Past Month at Unknown time  . milrinone (PRIMACOR) 20 MG/100 ML SOLN infusion Inject 11.25 mcg/min into the vein continuous. 11.25 mL 0 01/09/2016 at Unknown time  . oxyCODONE (OXY IR/ROXICODONE) 5 MG immediate release tablet Take 1 tablet (5 mg total) by mouth every 6 (six) hours as needed for moderate pain or severe pain. 1 tablet 0 Past Month at Unknown time  . potassium chloride SA (K-DUR,KLOR-CON) 20 MEQ tablet Take 1 tablet (20 mEq total) by mouth every other day. Take with torsemide. 18 tablet 6 Past Month at Unknown time  . torsemide (DEMADEX) 20 MG tablet Take 1 tablet (20 mg total) by mouth every other day. 18 tablet 6 01/08/2016 at Unknown time  . warfarin (COUMADIN) 2 MG tablet Take as directed by Coumadin Clinic. (Patient taking differently: Take 6-8 mg by mouth daily at 6 PM. Takes 6mg  on mon, wed and fri  Takes 8mg  all other days) 110 tablet 2 01/08/2016 at Unknown time  . spironolactone (ALDACTONE) 25 MG tablet Take 0.5 tablets (12.5 mg total) by mouth daily. (Patient not taking: Reported on 01/09/2016) 18 tablet 6 Not Taking at Unknown time    Inpatient Medications:  . amiodarone  200 mg Oral Daily  . heparin  2,500 Units Intravenous Once  . sodium chloride flush  3 mL Intravenous Q12H  . spironolactone  12.5 mg Oral Daily  . warfarin  7 mg Oral ONCE-1800  . Warfarin - Pharmacist Dosing Inpatient   Does not apply q1800    Allergies: No Known Allergies  Social History   Social History  . Marital status: Married    Spouse name: N/A  . Number of children: N/A  . Years of education: N/A   Occupational History  . Not on file.   Social History Main Topics  . Smoking status: Never Smoker  . Smokeless tobacco: Never Used  . Alcohol use No  . Drug use: No  . Sexual activity: No   Other Topics Concern  . Not  on file   Social History Narrative   Lives in [place] with husband.  Not currently working.     Family History  Problem Relation Age of Onset  . Hypertension Sister   . Diabetes Maternal Grandmother   . Cancer Mother     died young (pt was only in [employer] @ the time)  . Other Father     died in his 69's.     Review of Systems: All other systems reviewed and are otherwise negative except as noted above.  Physical Exam: Vitals:   01/09/16 2107 01/10/16 0037 01/10/16 0300 01/10/16 0423  BP: 113/73 105/64  102/66  Pulse: 86 86  87  Resp: 18 18  18   Temp: 98.4 F (36.9 C) 98.2 F (36.8 C) 98.1 F (36.7 C) 98.1 F (36.7 C)  TempSrc: Oral Oral  Oral  SpO2: 100% 100%  100%  Weight:    97 lb 4.8 oz (44.1 kg)  Height:        GEN- The patient is elderly appearing, alert and oriented x 3 today.   HEENT: normocephalic, atraumatic; sclera clear, conjunctiva pink; hearing intact; oropharynx clear; neck supple Lungs- Clear to ausculation bilaterally, normal work of breathing.  No wheezes, rales, rhonchi Heart- Regular rate and rhythm  GI- soft, non-tender, non-distended, bowel sounds present  Extremities- no clubbing, cyanosis, or edema; DP/PT/radial pulses 2+ bilaterally MS- no significant deformity or atrophy Skin- warm and dry, no rash or lesion Psych- euthymic mood, full affect Neuro- strength and sensation are intact  Labs:   Lab Results  Component Value Date   WBC 6.8 01/10/2016   HGB 12.6 01/10/2016   HCT 39.4 01/10/2016   MCV 84.9 01/10/2016   PLT 218 01/10/2016    Recent Labs Lab 01/09/16 1523 01/10/16 0303  NA 141 139  K 5.1 4.2  CL 105 104  CO2 28 27  BUN 19 20  CREATININE 1.16* 1.11*  CALCIUM 10.2 9.7  PROT 8.5*  --   BILITOT 1.2  --   ALKPHOS 94  --   ALT 27  --   AST 32  --   GLUCOSE 99 104*      Radiology/Studies: Dg Chest 2 View Result Date: 01/09/2016 CLINICAL DATA:  Shortness of breath and syncope EXAM: CHEST  2 VIEW COMPARISON:  October 10, 2015 FINDINGS: There is no edema or consolidation. There is cardiomegaly with pulmonary vascularity within normal limits. No adenopathy. Central catheter tip is at the cavoatrial junction, stable. No pneumothorax. No evident bone lesions. IMPRESSION: Stable cardiomegaly. No edema or consolidation. No evident adenopathy. Electronically Signed   By: Bretta Bang III M.D.   On: 01/09/2016 15:06    IOX:BDZHG rhythm, rate 82, LBBB QRS  TELEMETRY: sinus rhythm  Assessment/Plan: 1.  Syncope Story most consistent with vasovagal episode Encouraged awareness of triggers I do not think that ILR would be particularly helpful as her story is very consistent with vasovagal syncope  2.  Chronic systolic heart failure and LBBB CRTP would be very reasonable to do The patient is not sure Katrina would like to proceed. Katrina is very clear that Katrina would not like to proceed this admission Katrina is class IV HF on home Milrinone and so would not be a candidate for ICD therapy Continue medical therapy with Dr Shirlee Latch  3.  Pericardial effusion Updated echo this admission pending  4.  Persistent atrial fibrillation Maintaining SR on amiodarone Continue Warfarin long term for CHADS2VASC of 4 TSH, LFT's recently normal   Katrina Peck discuss further with Dr Shirlee Latch and her husband.  Katrina Peck schedule appt with Dr Elberta Fortis in 2 weeks to discuss further.    Signed, Katrina Balsam, NP 01/10/2016 9:25 AM  I have seen and examined this patient with Katrina Peck.  Agree with above, note added to reflect my findings.  On exam, regular rhythm, no murmurs, lungs clear. Presented to the hospital after syncopal episode during blood draw.  Felt hot and sweaty prior to blood draw.  Also had syncope over the summer when Katrina had just come inside from a hot day and was eating cold watermelon.  Had similar symptoms after the episode this past summer.  Katrina does have HF with a wide LBBB of ~150 msec.  Would potentially benefit  from CRT-P placement.  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  Discussed the procedure with the patient and Katrina would like to  think about it.  Katrina Baleigh Rennaker follow up in clinic.    Jaiyanna Safran M. Welda Azzarello MD 01/10/2016 9:44 AM

## 2016-01-10 NOTE — Care Management Note (Signed)
Case Management Note  Patient Details  Name: REGANNE GERING MRN: [id number] Date of Birth: [birth year]  Subjective/Objective:        Admitted with Syncope            Action/Plan: Patient is active with [facility] as prior to admission for home Milrinone drip. PCP is Dr Duanne Guess; has private insurance with Medicare / [employer]; CM will continue to follow for DCP  Expected Discharge Date:   possible 01/14/2016               Expected Discharge Plan:  Home w Home Health Services  In-House Referral:   [facility]  Discharge planning Services  CM Consult    Choice offered to:  Patient  HH Arranged:  RN, Disease Management, PT HH Agency:  [facility]  Status of Service:  In process, will continue to follow  Reola Mosher 01/10/2016, 9:56 AM

## 2016-01-11 LAB — URINE CULTURE: Culture: <10000 — AB

## 2016-01-12 ENCOUNTER — Encounter: Payer: Self-pay | Admitting: Cardiology

## 2016-01-12 ENCOUNTER — Telehealth (HOSPITAL_COMMUNITY): Payer: Self-pay

## 2016-01-12 NOTE — Telephone Encounter (Signed)
[name] RN [facility] called to get VO for recert orders. States patient is frustrated with CHF process bag, PICC line, RN HH visits, medications, diet and fluid restrictions). RN also endorses patient has become more noncompliant with taking medications and misses them more frequently (unsure if by accident or purposeful). Will make providers ware and refer to CHF social worker Annice Pih for supportive resources. Ave Filter, RN

## 2016-01-13 DIAGNOSIS — I313 Pericardial effusion (noninflammatory): Secondary | ICD-10-CM | POA: Diagnosis not present

## 2016-01-13 DIAGNOSIS — I429 Cardiomyopathy, unspecified: Secondary | ICD-10-CM | POA: Diagnosis not present

## 2016-01-13 DIAGNOSIS — I5032 Chronic diastolic (congestive) heart failure: Secondary | ICD-10-CM | POA: Diagnosis not present

## 2016-01-13 DIAGNOSIS — I110 Hypertensive heart disease with heart failure: Secondary | ICD-10-CM | POA: Diagnosis not present

## 2016-01-13 DIAGNOSIS — I480 Paroxysmal atrial fibrillation: Secondary | ICD-10-CM | POA: Diagnosis not present

## 2016-01-13 DIAGNOSIS — I240 Acute coronary thrombosis not resulting in myocardial infarction: Secondary | ICD-10-CM | POA: Diagnosis not present

## 2016-01-15 DIAGNOSIS — I429 Cardiomyopathy, unspecified: Secondary | ICD-10-CM | POA: Diagnosis not present

## 2016-01-15 DIAGNOSIS — I5032 Chronic diastolic (congestive) heart failure: Secondary | ICD-10-CM | POA: Diagnosis not present

## 2016-01-15 DIAGNOSIS — Z7901 Long term (current) use of anticoagulants: Secondary | ICD-10-CM | POA: Diagnosis not present

## 2016-01-15 DIAGNOSIS — I240 Acute coronary thrombosis not resulting in myocardial infarction: Secondary | ICD-10-CM | POA: Diagnosis not present

## 2016-01-15 DIAGNOSIS — D649 Anemia, unspecified: Secondary | ICD-10-CM | POA: Diagnosis not present

## 2016-01-15 DIAGNOSIS — Z5181 Encounter for therapeutic drug level monitoring: Secondary | ICD-10-CM | POA: Diagnosis not present

## 2016-01-15 DIAGNOSIS — I313 Pericardial effusion (noninflammatory): Secondary | ICD-10-CM | POA: Diagnosis not present

## 2016-01-15 DIAGNOSIS — E46 Unspecified protein-calorie malnutrition: Secondary | ICD-10-CM | POA: Diagnosis not present

## 2016-01-15 DIAGNOSIS — Z452 Encounter for adjustment and management of vascular access device: Secondary | ICD-10-CM | POA: Diagnosis not present

## 2016-01-15 DIAGNOSIS — I480 Paroxysmal atrial fibrillation: Secondary | ICD-10-CM | POA: Diagnosis not present

## 2016-01-15 DIAGNOSIS — I110 Hypertensive heart disease with heart failure: Secondary | ICD-10-CM | POA: Diagnosis not present

## 2016-01-16 ENCOUNTER — Ambulatory Visit (INDEPENDENT_AMBULATORY_CARE_PROVIDER_SITE_OTHER): Payer: Medicare Other | Admitting: Pharmacist Clinician (PhC)/ Clinical Pharmacy Specialist

## 2016-01-16 DIAGNOSIS — I240 Acute coronary thrombosis not resulting in myocardial infarction: Secondary | ICD-10-CM | POA: Diagnosis not present

## 2016-01-16 DIAGNOSIS — I480 Paroxysmal atrial fibrillation: Secondary | ICD-10-CM | POA: Diagnosis not present

## 2016-01-16 DIAGNOSIS — I110 Hypertensive heart disease with heart failure: Secondary | ICD-10-CM | POA: Diagnosis not present

## 2016-01-16 DIAGNOSIS — I429 Cardiomyopathy, unspecified: Secondary | ICD-10-CM | POA: Diagnosis not present

## 2016-01-16 DIAGNOSIS — I313 Pericardial effusion (noninflammatory): Secondary | ICD-10-CM | POA: Diagnosis not present

## 2016-01-16 DIAGNOSIS — I5032 Chronic diastolic (congestive) heart failure: Secondary | ICD-10-CM | POA: Diagnosis not present

## 2016-01-16 LAB — POCT INR: INR: 2.2

## 2016-01-19 ENCOUNTER — Ambulatory Visit (HOSPITAL_COMMUNITY)
Admission: RE | Admit: 2016-01-19 | Discharge: 2016-01-19 | Disposition: A | Discharge status: Home / Self Care | Payer: Medicare Other | Source: Ambulatory Visit | Attending: Internal Medicine | Admitting: Internal Medicine

## 2016-01-19 VITALS — BP 116/82 | Wt 101.0 lb

## 2016-01-19 DIAGNOSIS — I313 Pericardial effusion (noninflammatory): Secondary | ICD-10-CM | POA: Diagnosis not present

## 2016-01-19 DIAGNOSIS — Z7901 Long term (current) use of anticoagulants: Secondary | ICD-10-CM | POA: Diagnosis not present

## 2016-01-19 DIAGNOSIS — Z79899 Other long term (current) drug therapy: Secondary | ICD-10-CM | POA: Insufficient documentation

## 2016-01-19 DIAGNOSIS — Z9114 Patient's other noncompliance with medication regimen: Secondary | ICD-10-CM | POA: Diagnosis not present

## 2016-01-19 DIAGNOSIS — I110 Hypertensive heart disease with heart failure: Secondary | ICD-10-CM | POA: Diagnosis not present

## 2016-01-19 DIAGNOSIS — I428 Other cardiomyopathies: Secondary | ICD-10-CM | POA: Insufficient documentation

## 2016-01-19 DIAGNOSIS — I480 Paroxysmal atrial fibrillation: Secondary | ICD-10-CM | POA: Insufficient documentation

## 2016-01-19 DIAGNOSIS — I5022 Chronic systolic (congestive) heart failure: Secondary | ICD-10-CM | POA: Insufficient documentation

## 2016-01-19 DIAGNOSIS — R55 Syncope and collapse: Secondary | ICD-10-CM | POA: Insufficient documentation

## 2016-01-19 LAB — BASIC METABOLIC PANEL
Anion gap: 5 (ref 5–15)
BUN: 17 mg/dL (ref 6–20)
CALCIUM: 9.6 mg/dL (ref 8.9–10.3)
CHLORIDE: 107 mmol/L (ref 101–111)
CO2: 24 mmol/L (ref 22–32)
CREATININE: 0.93 mg/dL (ref 0.44–1.00)
GFR calc Af Amer: >60 mL/min (ref >60)
GFR calc non Af Amer: >60 mL/min (ref >60)
GLUCOSE: 107 mg/dL — AB (ref 65–99)
Potassium: 4.3 mmol/L (ref 3.5–5.1)
Sodium: 136 mmol/L (ref 135–145)

## 2016-01-19 MED ORDER — TORSEMIDE 20 MG PO TABS: 20.0000 mg | ORAL_TABLET | Freq: Every day | ORAL | 3 refills | Status: AC | PRN

## 2016-01-19 NOTE — Progress Notes (Signed)
116/82 

## 2016-01-19 NOTE — Progress Notes (Signed)
Patient ID: Katrina Peck, female   DOB: [birth year], 70 y.o.   MRN: [id number]    Advanced Heart Failure Clinic Note   PCP: Dr. Duanne Guess Cardiology: Dr. Shirlee Latch  70 y/o female with history of HTN who was admitted 07/20/14 with 3 months of exertional dyspnea, weight gain, cough, and orthopnea. Found to have EF 20% and a large pericardial effusion. Cath 5/16 no CAD. Hospital course complicated by atrial fibrillation w/ RVR, converted to NSR on amiodarone. Underwent pericardial window (transudate).    She had a repeat echo in 6/16 that showed persistently low LV EF at 15-20% with recurrence of moderate to large pericardial effusion without tamponade. This showed EF 10-15%, mild LV dilation, restrictive diastolic function, moderate to severely decreased RV function, and a moderate to large pericardial effusion similar to 6/16.   Admitted in 11/16 with marked volume overload. Diuresed with IV lasix. Discharged on milrinone 0.125 mcg. BB and ACE stopped. Discharge weight was 92 pounds.   She had problems with her PICC line (numbness/pain in right arm/hand) and wanted the PICC line out.  She did not want placement of tunneled catheter.  Therefore, PICC was removed and milrinone stopped.  She re-developed symptomatic low output CHF and was re-admitted in 12/16.  She then had tunneled catheter placed and was restarted on milrinone and was diuresed.  She was discharged home back on milrinone.  Echo in 12/16 showed EF 20% with LV thrombus.  She was put on coumadin.   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 Tunneled cath was removed and milrinone given through peripheral IV for several days. She completed a course of ceftriaxone x 6 weeks.  Tunneled cath replaced with repeat cultures negative for home inotropic support.    Admission 6/17 with tunneled catheter infection, cultures grew Enterobacter cloacae.   Catheter was replaced to allow ongoing home milrinone.    She was admitted in 7/17 with a syncopal episode. No prodrome. No clear diagnosis but she had stopped her amiodarone so I am concerned that it was VT/VF.  She has refused ICD.   Admitted 10/16 through 01/10/16 with syncope associated with blood draw at home. EP consulted for loop recorder. She refuse loop. Discharge weight 97 pounds.    She returns for post hospital follow up. Overall feeling good. Denies SOB/PND/orthopnea. She remains on home milrinone. Doing a little more at home.  Taking torsemide as needed. No BRBPR or melena on warfarin. Followed by [facility] for home milrinone.   Labs  5/16: K 4.6, creatinine 1.0 => 0.81, TSH normal, LFTs normal, TSH normal, BNP 1268 7/16: K 5.3 => 5.4, creatinine 1.0 => 1.02, HCT 39.7, BNP > 4500 => 1082, SPEP negative, urine IFE negative 8/16: K 5, creatinine 0.83 11/30/14: K 4.2 Creatinine 0.79  02/09/2015: K 4.5 Creatinine 1.11 12/16: digoxin < 0.2 1/17: K 3.8, creatinine 0.96, HCT 34.9 3/17: K 3.8, creatinine 0.77 6/17: K 4.1, creatinine 0.99, HCT 30.4 7/17: K 4.1, creatinine 1.03 9/17: K 4.6, creatinine 0.96, Hgb 12.1  SH: Married, no smoking, no ETOH.   FH: No history of cardiomyopathy or sudden death.    ROS: All systems reviewed and negative except as per HPI.   PMH: 1. Chronic systolic CHF: Nonischemic cardiomyopathy.  LHC/RHC (4/16) with no significant coronary disease; mean RA 5, PA 28/11, mean PCWP 9, CI 3.39.  She is now on home milrinone.  - Echo (4/16): EF  20% with diffuse hypokinesis, moderate RV systolic dysfunction, large pericardial effusion.  No history of drug or ETOH abuse.  No family history of cardiomyopathy.    - Echo (6/16) with EF 15-20%, restrictive diastolic function, moderate MR, moderately decreased RV systolic function, PA systolic pressure 49 mmHg, moderate-large pericardial effusion without tamponade.   - Echo (7/16) with EF 10-15%, mild LV dilation, diffuse  hypokinesis, restrictive diastolic dysfunction, moderate MR, moderate to severely decreased RV systolic function, moderate TR, PA systolic pressure 51 mmHg, moderate to large pericardial effusion, no change from 6/16.   - Echo (8/16) with EF 10-15%, mild to moderately reduced RV systolic function, moderate pericardial effusion (slightly smaller than prior), PASP 63 mmHg.   - Cardiac MRI (8/16) with LV EF 11%, RV EF 30%, mid-wall LGE involving the basal to mid septum (?myocarditis), moderate to large pericardial effusion with no evidence for tamponade.   - Echo (12/16): EF 20%, LV thrombus, mild MR, PASP 66 mmHg, moderate pericardial effusion.  - Echo (6/17) with EF 10%, small-moderate pericardial effusion.  2. Pericardial effusion: Large on 4/16 echo.  Patient had pericardial window for diagnostic and therapeutic purposes.  Cytology negative for malignancy.  6/16 echo with recurrence of moderate-large pericardial effusion without tamponade, similar repeat echo in 7/16, effusion slightly smaller in 8/16 by echo and MRI.  Moderate effusion on 12/16 echo.  Small to moderate pericardial effusion on 6/17 echo.  3. Atrial fibrillation: Atrial fibrillation with RVR, converted to NSR with amiodarone.   4. HTN 5. Noncompliance 6. LV thrombus: Noted on 12/16 echo.  7. MSSA bacteremia and discitis in 3/17, suspect tunneled catheter infection.  8. Enterobacter cloacae tunneled catheter infection 9. Syncope: 7/17, no definite etiology but concerned for arrhythmia. 10. Syncope 12/2015 thought to be vasovagal   Current Outpatient Prescriptions  Medication Sig Dispense Refill  . amiodarone (PACERONE) 200 MG tablet Take 1 tablet (200 mg total) by mouth daily. 30 tablet 6  . feeding supplement (BOOST / RESOURCE BREEZE) LIQD Take 1 Container by mouth 2 (two) times daily between meals. (Patient taking differently: Take 1 Container by mouth daily. )  0  . magnesium oxide (MAG-OX) 400 MG tablet Take 200 mg by mouth  daily as needed (leg cramping).    . milrinone (PRIMACOR) 20 MG/100 ML SOLN infusion Inject 11.25 mcg/min into the vein continuous. 11.25 mL 0  . oxyCODONE (OXY IR/ROXICODONE) 5 MG immediate release tablet Take 1 tablet (5 mg total) by mouth every 6 (six) hours as needed for moderate pain or severe pain. 1 tablet 0  . potassium chloride SA (K-DUR,KLOR-CON) 20 MEQ tablet Take 1 tablet (20 mEq total) by mouth every other day. Take with torsemide. 18 tablet 6  . spironolactone (ALDACTONE) 25 MG tablet Take 0.5 tablets (12.5 mg total) by mouth daily. 18 tablet 6  . torsemide (DEMADEX) 20 MG tablet Take 1 tablet (20 mg total) by mouth every other day. 18 tablet 6  . warfarin (COUMADIN) 2 MG tablet Take as directed by [facility]. (Patient taking differently: Take 6-8 mg by mouth daily at 6 PM. Takes 6mg  on mon, wed and fri  Takes 8mg  all other days) 110 tablet 2   No current facility-administered medications for this encounter.    No Known Allergies  Vitals:   01/19/16 1024  BP: 116/82  Weight: 101 lb (45.8 kg)   Wt Readings from Last 3 Encounters:  01/19/16 101 lb (45.8 kg)  01/10/16 97 lb 4.8 oz (44.1 kg)  12/23/15  102 lb (46.3 kg)     PHYSICAL EXAM: General: Elderly, NAD.  Thin.  HEENT: normal Neck: supple. JVP 5-6 cm.  Carotids 2+ bilat; no bruits. No thyromegaly or nodule noted.  Cor: PMI nondisplaced. Regular rate & rhythm. Paradoxical S2 split Lungs: CTAB, normal effort.  Abdomen: soft, NT, mildly distended. No hepatosplenomegaly. No bruits or masses. +BS Extremities: no cyanosis, clubbing, rash.  No edema. Right upper chest tunneled catheter.     Neuro: alert & oriented x 3, cranial nerves grossly intact. moves all 4 extremities w/o difficulty. Affect pleasant.  ASSESSMENT & PLAN: 1. Chronic systolic HF: Due to nonischemic cardiomyopathy.  cMRI (8/16) showed LV EF 11%, RV EF 30%, and patchy mid-wall LGE in the basal-mid septum that may suggest prior myocarditis.  She is now  back on milrinone 0.25 mcg/kg/min via tunneled catheter with no problems.  Echo (6/17) with EF 10%, LV thrombus not visualized.  NYHA class II symptoms on milrinone.  She is not volume overloaded on exam.  Weight goal 99-102 pounds.  - Change torsemide to 20 mg daily as needed.     - Continue milrinone 0.25 mcg/kg/min, we have been unable to titrate her off this. - Continue spironolactone 12.5 daily.   - No beta blocker with low output.  - She has advanced heart failure with high risk of morbidity/mortality over the next 6 months but she has great difficulty following cardiology recommendations.  She is not interested in advanced therapies or ICD. Adamantly declines.  2. PAF: Regular rate. Continue amio 200 mg daily. Check LFTs and TSH today.  She was told to get regular eye exams while on amiodarone.  - Continue warfarin. No bleeding problems. Encouraged  Coumadin compliance.  3. Pericardial effusion: S/p pericardial window. Transudate, cytology negative.  Last echo in 6/17 showed small to moderate effusion.  4. LV thrombus: She is on warfarin.  Thrombus not visualized on most recent echo October 2017.   5. Syncope: Occurred in 7/17 and 01/09/2016.   No definite etiology but concerned for arrhythmia.   She has refused ICD.  Continue amiodarone. .    Continue AHC. Follow up in 4-6  weeks.   Seung Nidiffer NP-C  01/19/2016

## 2016-01-19 NOTE — Patient Instructions (Signed)
CHANGE Torsemide to once daily AS NEEDED.  Follow up 2 months as scheduled.  Do the following things EVERYDAY: 1) Weigh yourself in the morning before breakfast. Write it down and keep it in a log. 2) Take your medicines as prescribed 3) Eat low salt foods-Limit salt (sodium) to 2000 mg per day.  4) Stay as active as you can everyday 5) Limit all fluids for the day to less than 2 liters

## 2016-01-20 ENCOUNTER — Other Ambulatory Visit (HOSPITAL_COMMUNITY): Payer: Self-pay | Admitting: Cardiology

## 2016-01-23 ENCOUNTER — Ambulatory Visit (INDEPENDENT_AMBULATORY_CARE_PROVIDER_SITE_OTHER): Payer: Medicare Other

## 2016-01-23 DIAGNOSIS — I480 Paroxysmal atrial fibrillation: Secondary | ICD-10-CM | POA: Diagnosis not present

## 2016-01-23 DIAGNOSIS — I240 Acute coronary thrombosis not resulting in myocardial infarction: Secondary | ICD-10-CM | POA: Diagnosis not present

## 2016-01-23 DIAGNOSIS — I110 Hypertensive heart disease with heart failure: Secondary | ICD-10-CM | POA: Diagnosis not present

## 2016-01-23 DIAGNOSIS — I429 Cardiomyopathy, unspecified: Secondary | ICD-10-CM | POA: Diagnosis not present

## 2016-01-23 DIAGNOSIS — I313 Pericardial effusion (noninflammatory): Secondary | ICD-10-CM | POA: Diagnosis not present

## 2016-01-23 DIAGNOSIS — I5032 Chronic diastolic (congestive) heart failure: Secondary | ICD-10-CM | POA: Diagnosis not present

## 2016-01-23 LAB — POCT INR: INR: 1.3

## 2016-01-23 NOTE — Progress Notes (Signed)
Electrophysiology Office Note   Date:  01/24/2016   ID:  Katrina, Peck Nov 24, 1945, MRN [id number]  PCP:  Maryelizabeth Rowan, MD  Cardiologist:  Shirlee Latch Primary Electrophysiologist:  Keanan Melander Katrina Loa, MD    Chief Complaint  Patient presents with  . Hospitalization Follow-up    PAF     History of Present Illness: Katrina Peck is a 70 y.o. female with a history of HTN who was admitted 07/20/14 with 3 months of exertional dyspnea, weight gain, cough, and orthopnea. Found to have EF 20% and a large pericardial effusion. Cath 5/16 no CAD. Hospital course complicated by atrial fibrillation w/ RVR, converted to NSR on amiodarone. Underwent pericardial window (transudate).    She had a repeat echo in 6/16 that showed persistently low LV EF at 15-20% with recurrence of moderate to large pericardial effusion without tamponade. This showed EF 10-15%, mild LV dilation, restrictive diastolic function, moderate to severely decreased RV function, and a moderate to large pericardial effusion similar to 6/16.   Admitted in 11/16 with marked volume overload. Diuresed with IV lasix. Discharged on milrinone 0.125 mcg. BB and ACE stopped. Discharge weight was 92 pounds.   She had problems with her PICC line (numbness/pain in right arm/hand) and wanted the PICC line out.  She did not want placement of tunneled catheter.  Therefore, PICC was removed and milrinone stopped.  She re-developed symptomatic low output CHF and was re-admitted in 12/16.  She then had tunneled catheter placed and was restarted on milrinone and was diuresed.  She was discharged home back on milrinone.  Echo in 12/16 showed EF 20% with LV thrombus.  She was put on coumadin.   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 Tunneled cath was removed and milrinone given through peripheral IV for several days. She completed a course  of ceftriaxone x 6 weeks.  Tunneled cath replaced with repeat cultures negative for home inotropic support.    Admission 6/17 with tunneled catheter infection, cultures grew Enterobacter cloacae.  Catheter was replaced to allow ongoing home milrinone.    She was admitted in 7/17 with a syncopal episode. No prodrome. Concern for VT. Had episode of syncope 01/10/16 after having her blood drawn.  Thought to have had vasovagal episode. While in the hospital had discussion of CRT-P but the patient wanted to think about it.   Today, she denies symptoms of palpitations, chest pain, shortness of breath, orthopnea, PND, lower extremity edema, claudication, dizziness, presyncope, syncope, bleeding, or neurologic sequela. The patient is tolerating medications without difficulties and is otherwise without complaint today.    Past Medical History:  Diagnosis Date  . Anemia   . Bacteremia    a. MSSA bacteremia and discitis in 3/17, suspect tunneled catheter infection.   . Blood transfusion without reported diagnosis   . Chronic systolic CHF (congestive heart failure) (HCC)    a. 06/2014 Echo: EF 20%.  . Discitis   . Hypertension   . LV (left ventricular) mural thrombus    a. Noted on 12/16 echo.   Marland Kitchen NICM (nonischemic cardiomyopathy) (HCC)    a. 06/2014 Echo: EF 20%, diff HK, mild MR, mildly dil LA/RA, mildly reduced RV fxn;  b. 06/2014 Cath: nl cors.  . Noncompliance   . PAF (paroxysmal atrial fibrillation) (HCC)    a. with RVR, converted to NSR with amiodarone.  . Pericardial effusion    a. 06/2014  Large effusion, no tamponade, s/p window for diag/therap - neg malig. b. 6/16 - recurrence w/o tamponade, subsequently folloed by echoes. Mod 12/16.  Marland Kitchen Syncope 09/2015   Past Surgical History:  Procedure Laterality Date  . CARDIAC CATHETERIZATION N/A 02/07/2015   Procedure: Right Heart Cath;  Surgeon: Laurey Morale, MD;  Location: [facility] INVASIVE CV LAB;  Service: Cardiovascular;  Laterality: N/A;  .  LEFT AND RIGHT HEART CATHETERIZATION WITH CORONARY ANGIOGRAM N/A 07/23/2014   Procedure: LEFT AND RIGHT HEART CATHETERIZATION WITH CORONARY ANGIOGRAM;  Surgeon: Laurey Morale, MD;  Location: [facility] CATH LAB;  Service: Cardiovascular;  Laterality: N/A;  . PERIPHERALLY INSERTED CENTRAL CATHETER INSERTION  10/10/2015  . PLEURAL EFFUSION DRAINAGE N/A 07/26/2014   Procedure: DRAINAGE OF PERICARDIAL EFFUSION;  Surgeon: Delight Ovens, MD;  Location: [facility] OR;  Service: Thoracic;  Laterality: N/A;  . RADIOLOGY WITH ANESTHESIA N/A 06/08/2015   Procedure: MRI CERVICAL SPINE WITH/WITHOUT LUMBAR WITH/WITHOUT THORACIC WITH/WITHOUT       (RADIOLOGY WITH ANESTHESIA);  Surgeon: Medication Radiologist, MD;  Location: [facility] OR;  Service: Radiology;  Laterality: N/A;  . SUBXYPHOID PERICARDIAL WINDOW N/A 07/26/2014   Procedure: SUBXYPHOID PERICARDIAL WINDOW;  Surgeon: Delight Ovens, MD;  Location: [facility] OR;  Service: Thoracic;  Laterality: N/A;  . TEE WITHOUT CARDIOVERSION N/A 07/26/2014   Procedure: TRANSESOPHAGEAL ECHOCARDIOGRAM (TEE);  Surgeon: Delight Ovens, MD;  Location[facility] OR;  Service: Thoracic;  Laterality: N/A;  . TEE WITHOUT CARDIOVERSION N/A 06/06/2015   Procedure: TRANSESOPHAGEAL ECHOCARDIOGRAM (TEE);  Surgeon: Dolores Patty, MD;  Location: [facility] ENDOSCOPY;  Service: Cardiovascular;  Laterality: N/A;     Current Outpatient Prescriptions  Medication Sig Dispense Refill  . amiodarone (PACERONE) 200 MG tablet Take 1 tablet (200 mg total) by mouth daily. 30 tablet 6  . feeding supplement (BOOST / RESOURCE BREEZE) LIQD Take 1 Container by mouth 2 (two) times daily between meals. (Patient taking differently: Take 1 Container by mouth daily. )  0  . magnesium oxide (MAG-OX) 400 MG tablet Take 200 mg by mouth daily as needed (leg cramping).    . milrinone (PRIMACOR) 20 MG/100 ML SOLN infusion Inject 11.25 mcg/min into the vein continuous. 11.25 mL 0  . oxyCODONE (OXY IR/ROXICODONE) 5 MG immediate release tablet  Take 1 tablet (5 mg total) by mouth every 6 (six) hours as needed for moderate pain or severe pain. 1 tablet 0  . potassium chloride SA (K-DUR,KLOR-CON) 20 MEQ tablet Take 1 tablet (20 mEq total) by mouth every other day. Take with torsemide. 18 tablet 6  . spironolactone (ALDACTONE) 25 MG tablet Take 0.5 tablets (12.5 mg total) by mouth daily. 18 tablet 6  . torsemide (DEMADEX) 20 MG tablet Take 1 tablet (20 mg total) by mouth daily as needed. 30 tablet 3  . warfarin (COUMADIN) 2 MG tablet Take as directed by [facility]. (Patient taking differently: Take 6-8 mg by mouth daily at 6 PM. Takes 6mg  on mon, wed and fri  Takes 8mg  all other days) 110 tablet 2   No current facility-administered medications for this visit.     Allergies:   Review of patient's allergies indicates no known allergies.   Social History:  The patient  reports that she has never smoked. She has never used smokeless tobacco. She reports that she does not drink alcohol or use drugs.   Family History:  The patient's family history includes Cancer in her mother; Diabetes in her maternal grandmother; Hypertension in her sister; Other in her father.  ROS:  Please see the history of present illness.   Otherwise, review of systems is positive for Shortness of breath laying down, dyspnea on exertion, balance problems, leg rash.   All other systems are reviewed and negative.    PHYSICAL EXAM: VS:  BP 124/80   Pulse 89   Ht 5' (1.524 m)   Wt 102 lb (46.3 kg)   BMI 19.92 kg/m  , BMI Body mass index is 19.92 kg/m. GEN: Well nourished, well developed, in no acute distress  HEENT: normal  Neck: no JVD, carotid bruits, or masses Cardiac: RRR; no murmurs, rubs, or gallops,no edema  Respiratory:  clear to auscultation bilaterally, normal work of breathing GI: soft, nontender, nondistended, + BS MS: no deformity or atrophy  Skin: warm and dry Neuro:  Strength and sensation are intact Psych: euthymic mood, full  affect  EKG:  EKG is ordered today. Personal review of the ekg ordered shows sinus rhythm, LBBB  Emery Dupuy call  Recent Labs: 12/23/2015: TSH 0.948 01/09/2016: ALT 27; B Natriuretic Peptide 1,300.0; Magnesium 2.0 01/10/2016: Hemoglobin 12.6; Platelets 218 01/19/2016: BUN 17; Creatinine, Ser 0.93; Potassium 4.3; Sodium 136    Lipid Panel  No results found for: CHOL, TRIG, HDL, CHOLHDL, VLDL, LDLCALC, LDLDIRECT   Wt Readings from Last 3 Encounters:  01/24/16 102 lb (46.3 kg)  01/19/16 101 lb (45.8 kg)  01/10/16 97 lb 4.8 oz (44.1 kg)      Other studies Reviewed: Additional studies/ records that were reviewed today include: TTE 01/10/16  Review of the above records today demonstrates:  - Left ventricle: The cavity size was severely dilated. Systolic   function was severely reduced. The estimated ejection fraction   was 15%. Wall motion was normal; there were no regional wall   motion abnormalities. - Aortic valve: Poorly visualized. Trileaflet; mildly thickened   leaflets. - Mitral valve: There was mild regurgitation. - Left atrium: The atrium was mildly dilated. - Pericardium, extracardiac: A small to moderate, free-flowing   pericardial effusion was identified circumferential to the heart.   The fluid had no internal echoes. There was moderateright atrial   chamber collapse for less than 50% of the cardiac cycle.   Difficult to assess right ventricular chamber collapse due to   small, under filled RV. Respirophasic change in stroke volume was   normal.The IVC is not dilated and collapses normally with   respirations.   ASSESSMENT AND PLAN:  1.  Nonischemic cardiomyopathy: currently on aldactone, torsemide, warfarin.  Unable to tolerate BB or ACEI/ARB due to low output and on milrinone. I had further discussion with her on the risks and benefits of CRT P placement. I also showed her example of her echocardiogram with her low EF. She says that she is feeling well and not having  any complaints of fatigue, weakness, or shortness of breath on the milrinone. As I cannot promise her that her ejection fraction Karalyn Kadel improve, or that she Avaree Gilberti significantly feel better, she does not feel like she wants a procedure at this time. We Sohail Capraro provide her with information on pacemaker placement and she Lanard Arguijo call us back and she changes her mind.  2. Syncope: Appears to be due to vagal issues. She does have a low EF and therefore there is concern for VT. She does not want device therapy at this time.  3. Hypertension: well controlled.    Current medicines are reviewed at length with the patient today.   The patient does not have concerns regarding her  medicines.  The following changes were made today:  none  Labs/ tests ordered today include:  Orders Placed This Encounter  Procedures  . EKG 12-Lead     Disposition:   FU with Nyjah Denio PRN  Signed, Danea Manter Katrina LoaMartin Shell Yandow, MD  01/24/2016 11:46 AM     [facility] [address] [place] [address] [phone] (office) [phone] (fax)

## 2016-01-24 ENCOUNTER — Encounter: Payer: Self-pay | Admitting: Cardiology

## 2016-01-24 ENCOUNTER — Ambulatory Visit (INDEPENDENT_AMBULATORY_CARE_PROVIDER_SITE_OTHER): Payer: Medicare Other | Admitting: Cardiology

## 2016-01-24 ENCOUNTER — Telehealth (HOSPITAL_COMMUNITY): Payer: Self-pay | Admitting: *Deleted

## 2016-01-24 VITALS — BP 124/80 | HR 89 | Ht 60.0 in | Wt 102.0 lb

## 2016-01-24 DIAGNOSIS — I5022 Chronic systolic (congestive) heart failure: Secondary | ICD-10-CM

## 2016-01-24 NOTE — Patient Instructions (Addendum)
Medication Instructions:    Your physician recommends that you continue on your current medications as directed. Please refer to the Current Medication list given to you today.  --- If you need a refill on your cardiac medications before your next appointment, please call your pharmacy. ---  Labwork:  None ordered  Testing/Procedures: CRT or cardiac resynchronization therapy is a treatment used to correct heart failure. When you have heart failure your heart is weakened and doesn't pump as well as it should. This therapy may help reduce symptoms and improve the quality of life.  Please see the handout/brochure given to you today to get more information of the different options of therapy.  Please call the office if/when you are ready to schedule this procedure.  Follow-Up:  To be determined if/when you schedule pacemaker.  Thank you for choosing [facility]!!   Dory HornSherri Yonna Alwin, RN [phone]    Any Other Special Instructions Will Be Listed Below (If Applicable).  Biventricular Pacemaker Implantation A pacemaker is a small, lightweight, battery-powered device that is placed (implanted) under the skin in the upper chest. Your health care provider may prescribe a pacemaker for you if your heartbeat is too slow (bradycardia). A biventricular pacemaker is a pulse generator connected by wires called leads that go into the two lower chambers on the right and left sides of your heart (ventricles). It is used to treat symptoms of heart failure. The pulse generator is a small computer run by a battery. The generator creates a regular electronic pulse. The pulse is sent through the leads, which go through a blood vessel and into the ventricles of your heart. This type of pacemaker makes a weak heart more efficient. LET [facility]ARE PROVIDER KNOW ABOUT:  Any allergies. Some allergies can cause serious problems during the procedure. Allergies to shellfish or agents, such as iodine, used in  liquids that enhance specific areas of your body on X-ray images (contrast dyes) are especially problematic.   All medicines you take. These include vitamins, herbs, eye drops, over-the-counter medicines, and creams.   Use of steroids.   Problems with numbing medicines (anesthetics).  Bleeding problems.   Past surgeries.   Other health problems. RISKS AND COMPLICATIONS Implanting a biventricular pacemaker is usually a safe procedure but problems can occur. For example:   Too much bleeding may occur.   Infection may develop.   Blood vessels, your lungs, or your heart may be harmed.   The pacemaker may not make your condition better. BEFORE THE PROCEDURE   You may need to have blood tests, heart tests, or a chest X-ray done before the day of the procedure.   Ask your health care provider about changing or stopping your regular medicines.   Make plans to have someone drive you home.  Stop smoking at least 24 hours before the procedure.  Take a bath or shower the night before the procedure. You may need to scrub your chest with a special type of soap.  Do not eat or drink anything after midnight the night before your procedure. Ask if it is okay to take any needed medicine with a small sip of water. PROCEDURE The procedure to put a pacemaker in your chest is usually done at a hospital in a room that has a large X-ray machine called a fluoroscope. The machine will be above you during the procedure. It will help your health care provider see your heart during the procedure. Before the procedure:   Small monitors  will be put on your body. They will be used to check your heart, blood pressure, and oxygen level.  A needle will be put into a vein in your hand or arm. This is called an intravenous (IV) access tube. Fluids and medicine will flow directly into your body through the IV tube.  Your chest will be cleaned with a germ-killing (antiseptic) solution. Your chest may  be shaved.  You may be given medicine to help you relax (sedative).   You will be given a numbing medicine called a local anesthetic. This medicine will make your chest area have no feeling while the pacemaker is implanted. You will be sleepy but awake during the procedure. After you are numb, the procedure will begin. The health care provider will:   Make a small cut (incision). This will make a pocket deep under your skin that will hold the pulse generator.  Guide the leads through a large blood vessel into your heart and attach them to the heart muscles.  Test the pacemaker.  Close the incision with stitches, glue, or staples. AFTER THE PROCEDURE  You may feel pain. Some pain is normal. It may last a few days.   You may stay in a recovery area until the local anesthetic has worn off. Your blood pressure and pulse will be checked often. You will be taken to a room where your heartbeat will be monitored.  A chest X-ray will be taken. This checks that the pacemaker is in the right place.  The pacemaker will be checked before you go home. It can be adjusted if that is needed.   This information is not intended to replace advice given to you by your health care provider. Make sure you discuss any questions you have with your health care provider.   Document Released: 12/05/2011 Document Revised: 04/02/2014 Document Reviewed: 12/05/2011 Elsevier Interactive Patient Education [employer]

## 2016-01-24 NOTE — Telephone Encounter (Signed)
Danita called to report pt told her she was now only going to have labs ever 3 weeks and she was going to come to our office for these labs.  Pt stated to her Chantel and Dr Shirlee Latch were aware.  Discussed w/Chantel, CMA she states at appt Dr Shirlee Latch did tell pt she could have labs every 3 weeks and have them done at our office.  Sabino Dick is aware

## 2016-01-26 DIAGNOSIS — I240 Acute coronary thrombosis not resulting in myocardial infarction: Secondary | ICD-10-CM | POA: Diagnosis not present

## 2016-01-26 DIAGNOSIS — I429 Cardiomyopathy, unspecified: Secondary | ICD-10-CM | POA: Diagnosis not present

## 2016-01-26 DIAGNOSIS — I5032 Chronic diastolic (congestive) heart failure: Secondary | ICD-10-CM | POA: Diagnosis not present

## 2016-01-26 DIAGNOSIS — I480 Paroxysmal atrial fibrillation: Secondary | ICD-10-CM | POA: Diagnosis not present

## 2016-01-26 DIAGNOSIS — I313 Pericardial effusion (noninflammatory): Secondary | ICD-10-CM | POA: Diagnosis not present

## 2016-01-26 DIAGNOSIS — I110 Hypertensive heart disease with heart failure: Secondary | ICD-10-CM | POA: Diagnosis not present

## 2016-01-30 ENCOUNTER — Inpatient Hospital Stay (HOSPITAL_COMMUNITY): Admission: RE | Admit: 2016-01-30 | Payer: Medicare Other | Source: Ambulatory Visit

## 2016-01-30 ENCOUNTER — Ambulatory Visit (INDEPENDENT_AMBULATORY_CARE_PROVIDER_SITE_OTHER): Payer: Medicare Other

## 2016-01-30 DIAGNOSIS — I5032 Chronic diastolic (congestive) heart failure: Secondary | ICD-10-CM | POA: Diagnosis not present

## 2016-01-30 DIAGNOSIS — I480 Paroxysmal atrial fibrillation: Secondary | ICD-10-CM

## 2016-01-30 DIAGNOSIS — I240 Acute coronary thrombosis not resulting in myocardial infarction: Secondary | ICD-10-CM | POA: Diagnosis not present

## 2016-01-30 DIAGNOSIS — I110 Hypertensive heart disease with heart failure: Secondary | ICD-10-CM | POA: Diagnosis not present

## 2016-01-30 DIAGNOSIS — I429 Cardiomyopathy, unspecified: Secondary | ICD-10-CM | POA: Diagnosis not present

## 2016-01-30 DIAGNOSIS — I313 Pericardial effusion (noninflammatory): Secondary | ICD-10-CM | POA: Diagnosis not present

## 2016-01-30 LAB — POCT INR: INR: 1.7

## 2016-02-06 ENCOUNTER — Ambulatory Visit (INDEPENDENT_AMBULATORY_CARE_PROVIDER_SITE_OTHER): Payer: Medicare Other | Admitting: Cardiovascular Disease

## 2016-02-06 DIAGNOSIS — I313 Pericardial effusion (noninflammatory): Secondary | ICD-10-CM | POA: Diagnosis not present

## 2016-02-06 DIAGNOSIS — I240 Acute coronary thrombosis not resulting in myocardial infarction: Secondary | ICD-10-CM | POA: Diagnosis not present

## 2016-02-06 DIAGNOSIS — I480 Paroxysmal atrial fibrillation: Secondary | ICD-10-CM | POA: Diagnosis not present

## 2016-02-06 DIAGNOSIS — I110 Hypertensive heart disease with heart failure: Secondary | ICD-10-CM | POA: Diagnosis not present

## 2016-02-06 DIAGNOSIS — I5032 Chronic diastolic (congestive) heart failure: Secondary | ICD-10-CM | POA: Diagnosis not present

## 2016-02-06 DIAGNOSIS — I429 Cardiomyopathy, unspecified: Secondary | ICD-10-CM | POA: Diagnosis not present

## 2016-02-06 LAB — POCT INR: INR: 2.1

## 2016-02-09 ENCOUNTER — Ambulatory Visit (HOSPITAL_COMMUNITY)
Admission: RE | Admit: 2016-02-09 | Discharge: 2016-02-09 | Disposition: A | Discharge status: Home / Self Care | Payer: Medicare Other | Source: Ambulatory Visit | Attending: Internal Medicine | Admitting: Internal Medicine

## 2016-02-09 DIAGNOSIS — I5022 Chronic systolic (congestive) heart failure: Secondary | ICD-10-CM | POA: Insufficient documentation

## 2016-02-09 LAB — CBC
HCT: 37.7 % (ref 36.0–46.0)
Hemoglobin: 12.1 g/dL (ref 12.0–15.0)
MCH: 27.4 pg (ref 26.0–34.0)
MCHC: 32.1 g/dL (ref 30.0–36.0)
MCV: 85.3 fL (ref 78.0–100.0)
PLATELETS: 201 10*3/uL (ref 150–400)
RBC: 4.42 MIL/uL (ref 3.87–5.11)
RDW: 15.5 % (ref 11.5–15.5)
WBC: 5.3 10*3/uL (ref 4.0–10.5)

## 2016-02-09 LAB — BASIC METABOLIC PANEL
Anion gap: 7 (ref 5–15)
BUN: 17 mg/dL (ref 6–20)
CALCIUM: 9.4 mg/dL (ref 8.9–10.3)
CO2: 23 mmol/L (ref 22–32)
Chloride: 109 mmol/L (ref 101–111)
Creatinine, Ser: 0.85 mg/dL (ref 0.44–1.00)
GFR calc Af Amer: >60 mL/min (ref >60)
GLUCOSE: 112 mg/dL — AB (ref 65–99)
Potassium: 4.4 mmol/L (ref 3.5–5.1)
Sodium: 139 mmol/L (ref 135–145)

## 2016-02-09 LAB — MAGNESIUM: Magnesium: 1.7 mg/dL (ref 1.7–2.4)

## 2016-02-13 ENCOUNTER — Ambulatory Visit (INDEPENDENT_AMBULATORY_CARE_PROVIDER_SITE_OTHER): Payer: Medicare Other | Admitting: Internal Medicine

## 2016-02-13 DIAGNOSIS — I429 Cardiomyopathy, unspecified: Secondary | ICD-10-CM | POA: Diagnosis not present

## 2016-02-13 DIAGNOSIS — I240 Acute coronary thrombosis not resulting in myocardial infarction: Secondary | ICD-10-CM | POA: Diagnosis not present

## 2016-02-13 DIAGNOSIS — I480 Paroxysmal atrial fibrillation: Secondary | ICD-10-CM | POA: Diagnosis not present

## 2016-02-13 DIAGNOSIS — I313 Pericardial effusion (noninflammatory): Secondary | ICD-10-CM | POA: Diagnosis not present

## 2016-02-13 DIAGNOSIS — I110 Hypertensive heart disease with heart failure: Secondary | ICD-10-CM | POA: Diagnosis not present

## 2016-02-13 DIAGNOSIS — I5032 Chronic diastolic (congestive) heart failure: Secondary | ICD-10-CM | POA: Diagnosis not present

## 2016-02-13 LAB — POCT INR: INR: 1.6

## 2016-02-15 ENCOUNTER — Telehealth (HOSPITAL_COMMUNITY): Payer: Self-pay

## 2016-02-15 MED ORDER — MAGNESIUM OXIDE 400 MG PO TABS: 400.0000 mg | ORAL_TABLET | Freq: Every day | ORAL | 3 refills | Status: AC

## 2016-02-15 NOTE — Telephone Encounter (Signed)
Magnesium  Order: [id number]  Status:  Final result Visible to patient:  Yes (MyChart) Dx:  Chronic systolic heart failure (HCC)  Notes Recorded by Chyrl Civatte, RN on 02/15/2016 at 9:16 AM EST Reviewed lab results and new medication with patient's daughter, Mag Rx sent to preferred pharmacy electronically. Daughter reports patient doing well. No questions/concerns/needs at this time. ------  Notes Recorded by Theresia Bough, CMA on 02/15/2016 at 9:06 AM EST Left message for patient to call back. [phone] (M) ------  Notes Recorded by Modesta Messing, CMA on 02/13/2016 at 2:43 PM EST Left message for patient to call back.  ------  Notes Recorded by Laurey Morale, MD on 02/11/2016 at 9:12 PM EST Low Mg, start Mg oxide 400 mg daily ------  Notes Recorded by Noralee Space, RN on 02/09/2016 at 5:02 PM EST Labs reviewed by RN, will forward to MD for review

## 2016-02-20 ENCOUNTER — Other Ambulatory Visit (HOSPITAL_COMMUNITY): Payer: Medicare Other

## 2016-02-20 ENCOUNTER — Ambulatory Visit (INDEPENDENT_AMBULATORY_CARE_PROVIDER_SITE_OTHER): Payer: Medicare Other | Admitting: Cardiovascular Disease

## 2016-02-20 DIAGNOSIS — I110 Hypertensive heart disease with heart failure: Secondary | ICD-10-CM | POA: Diagnosis not present

## 2016-02-20 DIAGNOSIS — I480 Paroxysmal atrial fibrillation: Secondary | ICD-10-CM

## 2016-02-20 DIAGNOSIS — I240 Acute coronary thrombosis not resulting in myocardial infarction: Secondary | ICD-10-CM | POA: Diagnosis not present

## 2016-02-20 DIAGNOSIS — I313 Pericardial effusion (noninflammatory): Secondary | ICD-10-CM | POA: Diagnosis not present

## 2016-02-20 DIAGNOSIS — I429 Cardiomyopathy, unspecified: Secondary | ICD-10-CM | POA: Diagnosis not present

## 2016-02-20 DIAGNOSIS — I5032 Chronic diastolic (congestive) heart failure: Secondary | ICD-10-CM | POA: Diagnosis not present

## 2016-02-20 LAB — POCT INR: INR: 1.3

## 2016-02-21 ENCOUNTER — Encounter (HOSPITAL_COMMUNITY): Payer: Medicare Other

## 2016-02-27 ENCOUNTER — Ambulatory Visit (INDEPENDENT_AMBULATORY_CARE_PROVIDER_SITE_OTHER): Payer: Medicare Other | Admitting: Cardiovascular Disease

## 2016-02-27 DIAGNOSIS — I480 Paroxysmal atrial fibrillation: Secondary | ICD-10-CM

## 2016-02-27 DIAGNOSIS — I110 Hypertensive heart disease with heart failure: Secondary | ICD-10-CM | POA: Diagnosis not present

## 2016-02-27 DIAGNOSIS — I429 Cardiomyopathy, unspecified: Secondary | ICD-10-CM | POA: Diagnosis not present

## 2016-02-27 DIAGNOSIS — I5032 Chronic diastolic (congestive) heart failure: Secondary | ICD-10-CM | POA: Diagnosis not present

## 2016-02-27 DIAGNOSIS — I313 Pericardial effusion (noninflammatory): Secondary | ICD-10-CM | POA: Diagnosis not present

## 2016-02-27 DIAGNOSIS — I240 Acute coronary thrombosis not resulting in myocardial infarction: Secondary | ICD-10-CM | POA: Diagnosis not present

## 2016-02-27 LAB — POCT INR: INR: 2

## 2016-02-29 ENCOUNTER — Ambulatory Visit (HOSPITAL_COMMUNITY)
Admission: RE | Admit: 2016-02-29 | Discharge: 2016-02-29 | Disposition: A | Discharge status: Home / Self Care | Payer: Medicare Other | Source: Ambulatory Visit | Attending: Cardiology | Admitting: Cardiology

## 2016-02-29 DIAGNOSIS — I5022 Chronic systolic (congestive) heart failure: Secondary | ICD-10-CM | POA: Diagnosis not present

## 2016-02-29 LAB — BASIC METABOLIC PANEL
ANION GAP: 4 — AB (ref 5–15)
BUN: 17 mg/dL (ref 6–20)
CALCIUM: 9 mg/dL (ref 8.9–10.3)
CO2: 26 mmol/L (ref 22–32)
CREATININE: 0.93 mg/dL (ref 0.44–1.00)
Chloride: 109 mmol/L (ref 101–111)
Glucose, Bld: 90 mg/dL (ref 65–99)
Potassium: 4.3 mmol/L (ref 3.5–5.1)
SODIUM: 139 mmol/L (ref 135–145)

## 2016-02-29 LAB — CBC
HCT: 36.7 % (ref 36.0–46.0)
HEMOGLOBIN: 11.7 g/dL — AB (ref 12.0–15.0)
MCH: 27.7 pg (ref 26.0–34.0)
MCHC: 31.9 g/dL (ref 30.0–36.0)
MCV: 86.8 fL (ref 78.0–100.0)
PLATELETS: 219 10*3/uL (ref 150–400)
RBC: 4.23 MIL/uL (ref 3.87–5.11)
RDW: 15.6 % — ABNORMAL HIGH (ref 11.5–15.5)
WBC: 5.6 10*3/uL (ref 4.0–10.5)

## 2016-02-29 LAB — MAGNESIUM: Magnesium: 2 mg/dL (ref 1.7–2.4)

## 2016-03-01 ENCOUNTER — Other Ambulatory Visit (HOSPITAL_COMMUNITY): Payer: Medicare Other

## 2016-03-05 ENCOUNTER — Other Ambulatory Visit (HOSPITAL_COMMUNITY): Payer: Medicare Other

## 2016-03-05 DIAGNOSIS — I240 Acute coronary thrombosis not resulting in myocardial infarction: Secondary | ICD-10-CM | POA: Diagnosis not present

## 2016-03-05 DIAGNOSIS — I5032 Chronic diastolic (congestive) heart failure: Secondary | ICD-10-CM | POA: Diagnosis not present

## 2016-03-05 DIAGNOSIS — I313 Pericardial effusion (noninflammatory): Secondary | ICD-10-CM | POA: Diagnosis not present

## 2016-03-05 DIAGNOSIS — I480 Paroxysmal atrial fibrillation: Secondary | ICD-10-CM | POA: Diagnosis not present

## 2016-03-05 DIAGNOSIS — I110 Hypertensive heart disease with heart failure: Secondary | ICD-10-CM | POA: Diagnosis not present

## 2016-03-05 DIAGNOSIS — I429 Cardiomyopathy, unspecified: Secondary | ICD-10-CM | POA: Diagnosis not present

## 2016-03-08 ENCOUNTER — Ambulatory Visit (INDEPENDENT_AMBULATORY_CARE_PROVIDER_SITE_OTHER): Payer: Medicare Other

## 2016-03-08 DIAGNOSIS — I480 Paroxysmal atrial fibrillation: Secondary | ICD-10-CM | POA: Diagnosis not present

## 2016-03-08 DIAGNOSIS — I429 Cardiomyopathy, unspecified: Secondary | ICD-10-CM | POA: Diagnosis not present

## 2016-03-08 DIAGNOSIS — I240 Acute coronary thrombosis not resulting in myocardial infarction: Secondary | ICD-10-CM | POA: Diagnosis not present

## 2016-03-08 DIAGNOSIS — I110 Hypertensive heart disease with heart failure: Secondary | ICD-10-CM | POA: Diagnosis not present

## 2016-03-08 DIAGNOSIS — I5032 Chronic diastolic (congestive) heart failure: Secondary | ICD-10-CM | POA: Diagnosis not present

## 2016-03-08 DIAGNOSIS — I313 Pericardial effusion (noninflammatory): Secondary | ICD-10-CM | POA: Diagnosis not present

## 2016-03-08 LAB — POCT INR: INR: 1.4

## 2016-03-13 ENCOUNTER — Telehealth (HOSPITAL_COMMUNITY): Payer: Self-pay

## 2016-03-13 DIAGNOSIS — I429 Cardiomyopathy, unspecified: Secondary | ICD-10-CM | POA: Diagnosis not present

## 2016-03-13 DIAGNOSIS — I313 Pericardial effusion (noninflammatory): Secondary | ICD-10-CM | POA: Diagnosis not present

## 2016-03-13 DIAGNOSIS — I480 Paroxysmal atrial fibrillation: Secondary | ICD-10-CM | POA: Diagnosis not present

## 2016-03-13 DIAGNOSIS — I5032 Chronic diastolic (congestive) heart failure: Secondary | ICD-10-CM | POA: Diagnosis not present

## 2016-03-13 DIAGNOSIS — I110 Hypertensive heart disease with heart failure: Secondary | ICD-10-CM | POA: Diagnosis not present

## 2016-03-13 DIAGNOSIS — I240 Acute coronary thrombosis not resulting in myocardial infarction: Secondary | ICD-10-CM | POA: Diagnosis not present

## 2016-03-13 NOTE — Telephone Encounter (Signed)
VO given per Amy Clegg NP-C to recert for [facility] services.  Ave Filter, RN

## 2016-03-15 ENCOUNTER — Ambulatory Visit (INDEPENDENT_AMBULATORY_CARE_PROVIDER_SITE_OTHER): Payer: Medicare Other | Admitting: Internal Medicine

## 2016-03-15 DIAGNOSIS — I480 Paroxysmal atrial fibrillation: Secondary | ICD-10-CM | POA: Diagnosis not present

## 2016-03-15 DIAGNOSIS — I240 Acute coronary thrombosis not resulting in myocardial infarction: Secondary | ICD-10-CM | POA: Diagnosis not present

## 2016-03-15 DIAGNOSIS — I5032 Chronic diastolic (congestive) heart failure: Secondary | ICD-10-CM | POA: Diagnosis not present

## 2016-03-15 DIAGNOSIS — E46 Unspecified protein-calorie malnutrition: Secondary | ICD-10-CM | POA: Diagnosis not present

## 2016-03-15 DIAGNOSIS — Z452 Encounter for adjustment and management of vascular access device: Secondary | ICD-10-CM | POA: Diagnosis not present

## 2016-03-15 DIAGNOSIS — Z7901 Long term (current) use of anticoagulants: Secondary | ICD-10-CM | POA: Diagnosis not present

## 2016-03-15 DIAGNOSIS — D649 Anemia, unspecified: Secondary | ICD-10-CM | POA: Diagnosis not present

## 2016-03-15 DIAGNOSIS — I429 Cardiomyopathy, unspecified: Secondary | ICD-10-CM | POA: Diagnosis not present

## 2016-03-15 DIAGNOSIS — I110 Hypertensive heart disease with heart failure: Secondary | ICD-10-CM | POA: Diagnosis not present

## 2016-03-15 DIAGNOSIS — I313 Pericardial effusion (noninflammatory): Secondary | ICD-10-CM | POA: Diagnosis not present

## 2016-03-15 DIAGNOSIS — Z5181 Encounter for therapeutic drug level monitoring: Secondary | ICD-10-CM | POA: Diagnosis not present

## 2016-03-15 LAB — POCT INR: INR: 1.3

## 2016-03-16 ENCOUNTER — Telehealth (HOSPITAL_COMMUNITY): Payer: Self-pay | Admitting: *Deleted

## 2016-03-16 NOTE — Telephone Encounter (Signed)
Danita left a voice message stating patient c/o new burning in lower legs and that her legs are darker than normal.  She has asked patient to make an appt with [place] but she hasn't. She was also checking to see if patient was still receiving lab work here in office every three weeks (she is still scheduled for every 3 week labs).  No answer from Danita will try her again this later.

## 2016-03-21 ENCOUNTER — Encounter (HOSPITAL_COMMUNITY): Payer: Self-pay

## 2016-03-21 ENCOUNTER — Emergency Department (HOSPITAL_COMMUNITY): Payer: Medicare Other

## 2016-03-21 ENCOUNTER — Inpatient Hospital Stay (HOSPITAL_COMMUNITY)
Admission: EM | Admit: 2016-03-21 | Discharge: 2016-03-24 | DRG: 606 | Disposition: A | Discharge status: Other Acute Inpatient Hospital | Payer: Medicare Other | Attending: Internal Medicine | Admitting: Internal Medicine

## 2016-03-21 DIAGNOSIS — I5043 Acute on chronic combined systolic (congestive) and diastolic (congestive) heart failure: Secondary | ICD-10-CM

## 2016-03-21 DIAGNOSIS — I513 Intracardiac thrombosis, not elsewhere classified: Secondary | ICD-10-CM

## 2016-03-21 DIAGNOSIS — I428 Other cardiomyopathies: Secondary | ICD-10-CM | POA: Diagnosis not present

## 2016-03-21 DIAGNOSIS — D649 Anemia, unspecified: Secondary | ICD-10-CM | POA: Diagnosis present

## 2016-03-21 DIAGNOSIS — I4581 Long QT syndrome: Secondary | ICD-10-CM | POA: Diagnosis present

## 2016-03-21 DIAGNOSIS — R079 Chest pain, unspecified: Secondary | ICD-10-CM | POA: Diagnosis not present

## 2016-03-21 DIAGNOSIS — I8291 Chronic embolism and thrombosis of unspecified vein: Secondary | ICD-10-CM | POA: Diagnosis not present

## 2016-03-21 DIAGNOSIS — I447 Left bundle-branch block, unspecified: Secondary | ICD-10-CM | POA: Diagnosis present

## 2016-03-21 DIAGNOSIS — I96 Gangrene, not elsewhere classified: Secondary | ICD-10-CM | POA: Diagnosis not present

## 2016-03-21 DIAGNOSIS — Z8249 Family history of ischemic heart disease and other diseases of the circulatory system: Secondary | ICD-10-CM

## 2016-03-21 DIAGNOSIS — I5023 Acute on chronic systolic (congestive) heart failure: Secondary | ICD-10-CM | POA: Diagnosis not present

## 2016-03-21 DIAGNOSIS — Z681 Body mass index (BMI) 19 or less, adult: Secondary | ICD-10-CM | POA: Diagnosis not present

## 2016-03-21 DIAGNOSIS — L299 Pruritus, unspecified: Secondary | ICD-10-CM | POA: Diagnosis present

## 2016-03-21 DIAGNOSIS — I480 Paroxysmal atrial fibrillation: Secondary | ICD-10-CM | POA: Diagnosis not present

## 2016-03-21 DIAGNOSIS — N179 Acute kidney failure, unspecified: Secondary | ICD-10-CM | POA: Diagnosis not present

## 2016-03-21 DIAGNOSIS — R21 Rash and other nonspecific skin eruption: Principal | ICD-10-CM | POA: Diagnosis present

## 2016-03-21 DIAGNOSIS — I130 Hypertensive heart and chronic kidney disease with heart failure and stage 1 through stage 4 chronic kidney disease, or unspecified chronic kidney disease: Secondary | ICD-10-CM | POA: Diagnosis present

## 2016-03-21 DIAGNOSIS — Z833 Family history of diabetes mellitus: Secondary | ICD-10-CM

## 2016-03-21 DIAGNOSIS — R0602 Shortness of breath: Secondary | ICD-10-CM | POA: Diagnosis not present

## 2016-03-21 DIAGNOSIS — Z7901 Long term (current) use of anticoagulants: Secondary | ICD-10-CM

## 2016-03-21 LAB — BASIC METABOLIC PANEL
Anion gap: 8 (ref 5–15)
BUN: 22 mg/dL — AB (ref 6–20)
CO2: 23 mmol/L (ref 22–32)
CREATININE: 1.3 mg/dL — AB (ref 0.44–1.00)
Calcium: 9.2 mg/dL (ref 8.9–10.3)
Chloride: 110 mmol/L (ref 101–111)
GFR calc Af Amer: 47 mL/min — ABNORMAL LOW (ref >60)
GFR, EST NON AFRICAN AMERICAN: 41 mL/min — AB (ref >60)
Glucose, Bld: 94 mg/dL (ref 65–99)
POTASSIUM: 5.2 mmol/L — AB (ref 3.5–5.1)
SODIUM: 141 mmol/L (ref 135–145)

## 2016-03-21 LAB — I-STAT TROPONIN, ED: TROPONIN I, POC: 0.24 ng/mL — AB (ref 0.00–0.08)

## 2016-03-21 LAB — CBC
HEMATOCRIT: 35.4 % — AB (ref 36.0–46.0)
Hemoglobin: 11.2 g/dL — ABNORMAL LOW (ref 12.0–15.0)
MCH: 27.3 pg (ref 26.0–34.0)
MCHC: 31.6 g/dL (ref 30.0–36.0)
MCV: 86.3 fL (ref 78.0–100.0)
PLATELETS: 304 10*3/uL (ref 150–400)
RBC: 4.1 MIL/uL (ref 3.87–5.11)
RDW: 15.5 % (ref 11.5–15.5)
WBC: 9.7 10*3/uL (ref 4.0–10.5)

## 2016-03-21 LAB — BRAIN NATRIURETIC PEPTIDE: B Natriuretic Peptide: >4500 pg/mL — ABNORMAL HIGH (ref 0.0–100.0)

## 2016-03-21 LAB — PROTIME-INR
INR: 1.45
Prothrombin Time: 17.8 seconds — ABNORMAL HIGH (ref 11.4–15.2)

## 2016-03-21 MED ORDER — FUROSEMIDE 10 MG/ML IJ SOLN
20.0000 mg | Freq: Two times a day (BID) | INTRAMUSCULAR | Status: DC
Start: 2016-03-21 — End: 2016-03-22
  Administered 2016-03-21: 20 mg via INTRAVENOUS
  Filled 2016-03-21 (×2): qty 2

## 2016-03-21 MED ORDER — DIPHENHYDRAMINE HCL 50 MG/ML IJ SOLN
25.0000 mg | Freq: Three times a day (TID) | INTRAMUSCULAR | Status: DC
  Administered 2016-03-21: 25 mg via INTRAMUSCULAR
  Filled 2016-03-21: qty 1

## 2016-03-21 MED ORDER — HEPARIN (PORCINE) IN NACL 100-0.45 UNIT/ML-% IJ SOLN
900.0000 [IU]/h | INTRAMUSCULAR | Status: DC
  Administered 2016-03-21: 800 [IU]/h via INTRAVENOUS
  Administered 2016-03-22 – 2016-03-24 (×2): 900 [IU]/h via INTRAVENOUS
  Filled 2016-03-21 (×3): qty 250

## 2016-03-21 MED ORDER — HEPARIN BOLUS VIA INFUSION
2500.0000 [IU] | Freq: Once | INTRAVENOUS | Status: AC
  Administered 2016-03-21: 2500 [IU] via INTRAVENOUS
  Filled 2016-03-21: qty 2500

## 2016-03-21 NOTE — ED Notes (Signed)
Heparin verified with Baird Lyons, RN

## 2016-03-21 NOTE — ED Notes (Signed)
Pt going to xray  

## 2016-03-21 NOTE — ED Notes (Signed)
Phlebotomy at bedside.

## 2016-03-21 NOTE — ED Triage Notes (Signed)
Pt presents for evaluation of SOB and rash to bilateral legs and R arm x 3 weeks. Pt. Is on milrinone drip, reports SOB at baseline but states has worsened over past few months with exertion. Pt states rash is itchy, pt reports unsure of whether it is due to medications.

## 2016-03-21 NOTE — Consult Note (Signed)
CARDIOLOGY CONSULT NOTE   Patient ID: ZYIA HUICOCHEA MRN: [id number] DOB/AGE: 06-05-1945 70 y.o.  Admit date: 03/21/2016  Primary Physician   Maryelizabeth Rowan, MD Primary Cardiologist   Dr. Shirlee Latch (CHF) EP: Dr. Elberta Fortis Reason for Consultation   Rash/CHF Requesting Physician  Dr. Rosario Adie  HPI: Katrina Peck is a 70 y.o. female with a history of HTN, chronic systolic CHF, PAF, syncope, pericardiac effusion, and LV thrombus who presents for evaluation of rash.  She admitted 07/20/14 with 3 months of exertional dyspnea, weight gain, cough, and orthopnea. Found to have EF 20% and a large pericardial effusion. Cath 5/16 no CAD. Hospital course complicated by atrial fibrillation w/ RVR, converted to NSR on amiodarone. Underwent pericardial window (transudate).   She had a repeat echo in 6/16 that showed persistently low LV EF at 15-20% with recurrence of moderate to large pericardial effusion without tamponade. This showed EF 10-15%, mild LV dilation, restrictive diastolic function, moderate to severely decreased RV function, and a moderate to large pericardial effusion similar to 6/16.   Admitted in 11/16 with marked volume overload. Diuresed with IV lasix. Discharged on milrinone 0.125 mcg. BB and ACE stopped. Discharge weight was 92 pounds.   She had problems with her PICC line (numbness/pain in right arm/hand) and wanted the PICC line out. She did not want placement of tunneled catheter. Therefore, PICC was removed and milrinone stopped. She re-developed symptomatic low output CHF and was re-admitted in 12/16. She then had tunneled catheter placed and was restarted on milrinone and was diuresed. She was discharged home back on milrinone. Echo in 12/16 showed EF 20% with LV thrombus. She was put on coumadin.   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 Tunneled cath was removed and  milrinone given through peripheral IV for several days. She completed a course of ceftriaxone x 6 weeks. Tunneled cath replaced with repeat cultures negative for home inotropic support.   Admission 6/17 with tunneled catheter infection, cultures grew Enterobacter cloacae. Catheter was replaced to allow ongoing home milrinone.   She was admitted in 7/17 with a syncopal episode. No prodrome. Concern for VT. Had episode of syncope 01/10/16 after having her blood drawn.  Thought to have had vasovagal episode. While in the hospital had discussion of CRT-P but the patient wanted to think about it.  Seen by Dr. Elberta Fortis 01/24/16. That time she was unsure about CRT P placement. She was giving to call us if interested.  Present today to the ER with 3 weeks history of worsening rest. He started on her legs bilaterally.  initially it was itchy and now has black discoloration that sprayed up into her thigh bilaterally. Itching on her abdomen. Also had blackish discoloration on her hand. She is also complaining of chronic exertional dyspnea which has been worsened recently. Intermittent orthopnea and PND. No edema. Denies syncope, fever, abdominal pain, melena or blood in her stool or urine. No recent medication changes. Admits to having chills.  EKG today shows normal sinus rhythm at rate of 95 bpm, PACs, prolonged QT interval and chronic left bundle-branch block. BNP greater than 4500. Point-of-care troponin 0.24. Creatinine 1.3. Potassium 5.2. Hemoglobin 11.2. Chest x-ray stable cardiomegaly without acute evidence of fluid overload.  Past Medical History:  Diagnosis Date  . Anemia   . Bacteremia    a. MSSA bacteremia and discitis in 3/17, suspect tunneled catheter infection.   . Blood transfusion  without reported diagnosis   . Chronic systolic CHF (congestive heart failure) (HCC)    a. 06/2014 Echo: EF 20%.  . Discitis   . Hypertension   . LV (left ventricular) mural thrombus    a. Noted on 12/16  echo.   Marland Kitchen NICM (nonischemic cardiomyopathy) (HCC)    a. 06/2014 Echo: EF 20%, diff HK, mild MR, mildly dil LA/RA, mildly reduced RV fxn;  b. 06/2014 Cath: nl cors.  . Noncompliance   . PAF (paroxysmal atrial fibrillation) (HCC)    a. with RVR, converted to NSR with amiodarone.  . Pericardial effusion    a. 06/2014 Large effusion, no tamponade, s/p window for diag/therap - neg malig. b. 6/16 - recurrence w/o tamponade, subsequently folloed by echoes. Mod 12/16.  Marland Kitchen Syncope 09/2015     Past Surgical History:  Procedure Laterality Date  . CARDIAC CATHETERIZATION N/A 02/07/2015   Procedure: Right Heart Cath;  Surgeon: Laurey Morale, MD;  Location[facility] INVASIVE CV LAB;  Service: Cardiovascular;  Laterality: N/A;  . LEFT AND RIGHT HEART CATHETERIZATION WITH CORONARY ANGIOGRAM N/A 07/23/2014   Procedure: LEFT AND RIGHT HEART CATHETERIZATION WITH CORONARY ANGIOGRAM;  Surgeon: Laurey Morale, MD;  Location: [facility] CATH LAB;  Service: Cardiovascular;  Laterality: N/A;  . PERIPHERALLY INSERTED CENTRAL CATHETER INSERTION  10/10/2015  . PLEURAL EFFUSION DRAINAGE N/A 07/26/2014   Procedure: DRAINAGE OF PERICARDIAL EFFUSION;  Surgeon: Delight Ovens, MD;  Location: [facility] OR;  Service: Thoracic;  Laterality: N/A;  . RADIOLOGY WITH ANESTHESIA N/A 06/08/2015   Procedure: MRI CERVICAL SPINE WITH/WITHOUT LUMBAR WITH/WITHOUT THORACIC WITH/WITHOUT       (RADIOLOGY WITH ANESTHESIA);  Surgeon: Medication Radiologist, MD;  Location: [facility] OR;  Service: Radiology;  Laterality: N/A;  . SUBXYPHOID PERICARDIAL WINDOW N/A 07/26/2014   Procedure: SUBXYPHOID PERICARDIAL WINDOW;  Surgeon: Delight Ovens, MD;  Location: [facility] OR;  Service: Thoracic;  Laterality: N/A;  . TEE WITHOUT CARDIOVERSION N/A 07/26/2014   Procedure: TRANSESOPHAGEAL ECHOCARDIOGRAM (TEE);  Surgeon: Delight Ovens, MD;  Location: [facility] OR;  Service: Thoracic;  Laterality: N/A;  . TEE WITHOUT CARDIOVERSION N/A 06/06/2015   Procedure: TRANSESOPHAGEAL ECHOCARDIOGRAM (TEE);   Surgeon: Dolores Patty, MD;  Location: [facility] ENDOSCOPY;  Service: Cardiovascular;  Laterality: N/A;    No Known Allergies  I have reviewed the patient's current medications     Prior to Admission medications   Medication Sig Start Date End Date Taking? Authorizing Provider  amiodarone (PACERONE) 200 MG tablet Take 1 tablet (200 mg total) by mouth daily. 10/12/15  Yes Graciella Freer, PA-C  feeding supplement (BOOST / RESOURCE BREEZE) LIQD Take 1 Container by mouth 2 (two) times daily between meals. Patient taking differently: Take 1 Container by mouth daily.  10/12/15  Yes Starleen Arms, MD  magnesium oxide (MAG-OX) 400 MG tablet Take 1 tablet (400 mg total) by mouth daily. 02/15/16  Yes Laurey Morale, MD  milrinone [employer]) 20 MG/100 ML SOLN infusion Inject 11.25 mcg/min into the vein continuous. 09/12/15  Yes Alison Murray, MD  oxyCODONE (OXY IR/ROXICODONE) 5 MG immediate release tablet Take 1 tablet (5 mg total) by mouth every 6 (six) hours as needed for moderate pain or severe pain. 10/12/15  Yes Leana Roe Elgergawy, MD  potassium chloride SA (K-DUR,KLOR-CON) 20 MEQ tablet Take 1 tablet (20 mEq total) by mouth every other day. Take with torsemide. 10/12/15  Yes Graciella Freer, PA-C  spironolactone (ALDACTONE) 25 MG tablet Take 0.5 tablets (12.5 mg total) by mouth  daily. 10/12/15  Yes Graciella Freer, PA-C  torsemide (DEMADEX) 20 MG tablet Take 1 tablet (20 mg total) by mouth daily as needed. 01/19/16  Yes Amy D Clegg, NP  warfarin (COUMADIN) 2 MG tablet Take as directed by [facility]. Patient taking differently: Take 6-8 mg by mouth daily at 6 PM. Takes 6mg  on mon, wed and fri  Takes 8mg  all other days 01/09/16  Yes Laurey Morale, MD     Social History   Social History  . Marital status: Married    Spouse name: N/A  . Number of children: N/A  . Years of education: N/A   Occupational History  . Not on file.   Social History Main Topics  .  Smoking status: Never Smoker  . Smokeless tobacco: Never Used  . Alcohol use No  . Drug use: No  . Sexual activity: No   Other Topics Concern  . Not on file   Social History Narrative   Lives in [place] with husband.  Not currently working.    Family Status  Relation Status  . Sister Alive  . Maternal Grandmother Deceased  . Mother Deceased  . Father Deceased   Family History  Problem Relation Age of Onset  . Hypertension Sister   . Diabetes Maternal Grandmother   . Cancer Mother     died young (pt was only in [employer] @ the time)  . Other Father     died in his 69's.     ROS:  Full 14 point review of systems complete and found to be negative unless listed above.  Physical Exam: Blood pressure 105/91, pulse 103, temperature 98.2 F (36.8 C), temperature source Oral, resp. rate 24, SpO2 100 %.  General: Thin frail female in no acute distress Head: Eyes PERRLA, No xanthomas. Normocephalic and atraumatic, oropharynx without edema or exudate.  Lungs: Resp regular and unlabored, CTA. Heart: RRR no s3, s4, or murmurs..   Neck: No carotid bruits. No lymphadenopathy. No JVD. Abdomen: Bowel sounds present, abdomen soft and non-tender without masses or hernias noted. Msk:  No spine or cva tenderness. No weakness, no joint deformities or effusions. Extremities: No clubbing, cyanosis or edema. DP/PT/Radials 2+ and equal bilaterally. Neuro: Alert and oriented X 3. No focal deficits noted. Psych:  Good affect, responds appropriately Skin: Black discoloration of legs bilaterally. Intermittent discoloration on upper extremities.  Labs:   Lab Results  Component Value Date   WBC 9.7 03/21/2016   HGB 11.2 (L) 03/21/2016   HCT 35.4 (L) 03/21/2016   MCV 86.3 03/21/2016   PLT 304 03/21/2016    Recent Labs  03/21/16 1752  INR 1.45    Recent Labs Lab 03/21/16 1629  NA 141  K 5.2*  CL 110  CO2 23  BUN 22*  CREATININE 1.30*  CALCIUM 9.2  GLUCOSE 94   Magnesium  Date  Value Ref Range Status  02/29/2016 2.0 1.7 - 2.4 mg/dL Final   No results for input(s): CKTOTAL, CKMB, TROPONINI in the last 72 hours.  Recent Labs  03/21/16 1642  TROPIPOC 0.24*   No results found for: PROBNP No results found for: CHOL, HDL, LDLCALC, TRIG No results found for: DDIMER No results found for: LIPASE, AMYLASE TSH  Date/Time Value Ref Range Status  12/23/2015 02:37 PM 0.948 0.350 - 4.500 uIU/mL Final  08/13/2014 11:30 AM 2.110 0.350 - 4.500 uIU/mL Final   No results found for: VITAMINB12, FOLATE, FERRITIN, TIBC, IRON, RETICCTPCT  Echo: 01/10/2016 LV EF:  15%  ------------------------------------------------------------------- Indications:      CHF - 428.0.  ------------------------------------------------------------------- History:   PMH:  Syncope. Chronic systolic heart failure and LBBB. Persistent atrial fibrillation. Pericardial effusion, chronic.  ------------------------------------------------------------------- Study Conclusions  - Left ventricle: The cavity size was severely dilated. Systolic   function was severely reduced. The estimated ejection fraction   was 15%. Wall motion was normal; there were no regional wall   motion abnormalities. - Aortic valve: Poorly visualized. Trileaflet; mildly thickened   leaflets. - Mitral valve: There was mild regurgitation. - Left atrium: The atrium was mildly dilated. - Pericardium, extracardiac: A small to moderate, free-flowing   pericardial effusion was identified circumferential to the heart.   The fluid had no internal echoes. There was moderateright atrial   chamber collapse for less than 50% of the cardiac cycle.   Difficult to assess right ventricular chamber collapse due to   small, under filled RV. Respirophasic change in stroke volume was   normal.The IVC is not dilated and collapses normally with   respirations.  Radiology:  Dg Chest 2 View  Result Date: 03/21/2016 CLINICAL DATA:   Shortness of Breath, chest pain. EXAM: CHEST  2 VIEW COMPARISON:  01/09/2016 FINDINGS: Right Central line remains in place, unchanged. Marked cardiomegaly. No confluent airspace opacities, effusions or edema. No acute bony abnormality. IMPRESSION: Stable marked cardiomegaly.  No active disease. Electronically Signed   By: Charlett NoseKevin  Dover M.D.   On: 03/21/2016 18:25    ASSESSMENT AND PLAN:     1. Chronic systolic congestive heart failure/nonischemic cardiomyopathy - Last echo 12/2015 showed EF of 15% , no regional ligation of LV thrombus. She continues to have dyspnea on exertion with intermittent orthopnea and PND. She has declined ICD placement. She does not look volume overloaded on exam however BNP is more than  4500. She is on milrinone. CHF team to follow tomorrow. Continue current medication.  2. PAF - Maintaining sinus rhythm. CHADSVASCs score of 4. She is on amiodarone 200 mg once a day. Her rash is likely warfarin toxicity. Stop amiodarone and warfarin. Start on IV heparin when INR less than 2.  3. Rash - No recent change in any medication. No tick bite or recent illness. Questionable amiodarone vs coumadin toxicity. Stop both.   4. LV thrombus - No vizualizaton on last echo. On Warfarin.   5.Syncope - No definite etiology.   SignedManson Passey: Bhagat,Bhavinkumar, PA 03/21/2016, 7:40 PM Pager [phone]  Co-Sign MD   The patient was seen, examined and discussed with Bhagat,Bhavinkumar PA-C and I agree with the above.   Katrina SchneidersBetty J Peck is a 70 y.o. female with a complex history of HTN, chronic systolic CHF with LVEF 15-20% on chronic milrinone therapy, restrictive pattern of DD, moderate to severe RV dysfunction, followed by Dr Shirlee LatchMcLean, PAF on coumadin, syncope, pericardiac effusion, and LV thrombus who presents for evaluation of rash. She also has exertional dyspnea, non-productive cough and orthopnea.  Admitted in 01/2015 with marked volume overload. Diuresed with IV lasix. Discharged on  milrinone 0.125 mcg. BB and ACE stopped. Discharge weight was 92 pounds. She had problems with her PICC line (numbness/pain in right arm/hand) and wanted the PICC line out. She did not want placement of tunneled catheter. Therefore, PICC was removed and milrinone stopped. She re-developed symptomatic low output CHF and was re-admitted in 12/16. She then had tunneled catheter placed and was restarted on milrinone and was diuresed. She was discharged home back on milrinone. Echo in 12/16 showed EF 20% with  LV thrombus. She was put on coumadin.   She developed rash around her ankles approximately 3 weeks ago that slowly progressed to knees and all the way to the groins. It is profoundly itchy. She has elevated JVDs up to the jaws, no crackles on auscultations. Her legs show 1+ swelling, dark discoloration, increase warmth and  and scaling. There is also milder form on her forearms.   This is possibly a coumadin skin necrosis, I will hold, also amiodarone, dermatology and ID consult should be called. I will start benadryl for itching. ? Use of steroid. I would appreciate derm input.  Start Heparin drip, INR is subtherapeutic.   She has LE edema, I will start lasix 20 mg iv BID. Crea 1.3, baseline 0.9-1.1. Troponin elevated at 0.24, we will follow trend, start heparin drip.  Katrina Peck 03/21/2016

## 2016-03-21 NOTE — ED Provider Notes (Signed)
[facility]-EMERGENCY DEPT Provider Note   CSN: [id number] Arrival date & time: 03/21/16  1519     History   Chief Complaint Chief Complaint  Patient presents with  . Rash  . Shortness of Breath    HPI Katrina Peck is a 70 y.o. female.  The history is provided by the patient. No language interpreter was used.  Rash    Shortness of Breath  Associated symptoms include rash.   Katrina Peck is a 70 y.o. female who presents to the Emergency Department complaining of rash, sob.  She reports 3-4 weeks of progressive itchy rash and shortness of breath. The rash began on her legs and has spread throughout her legs into her arms. She has associated discoloration. She also endorses progressive shortness of breath and dyspnea on exertion. No fever, chest pain, cough. She has mild leg swelling that is intermittent and near her baseline. Her leg swelling improves after she takes her torsemide. She is currently on a milrinone infusion via PICC line. No recent medication changes. Past Medical History:  Diagnosis Date  . Anemia   . Bacteremia    a. MSSA bacteremia and discitis in 3/17, suspect tunneled catheter infection.   . Blood transfusion without reported diagnosis   . Chronic systolic CHF (congestive heart failure) (HCC)    a. 06/2014 Echo: EF 20%.  . Discitis   . Hypertension   . LV (left ventricular) mural thrombus    a. Noted on 12/16 echo.   Marland Kitchen NICM (nonischemic cardiomyopathy) (HCC)    a. 06/2014 Echo: EF 20%, diff HK, mild MR, mildly dil LA/RA, mildly reduced RV fxn;  b. 06/2014 Cath: nl cors.  . Noncompliance   . PAF (paroxysmal atrial fibrillation) (HCC)    a. with RVR, converted to NSR with amiodarone.  . Pericardial effusion    a. 06/2014 Large effusion, no tamponade, s/p window for diag/therap - neg malig. b. 6/16 - recurrence w/o tamponade, subsequently folloed by echoes. Mod 12/16.  Marland Kitchen Syncope 09/2015    Patient Active Problem List   Diagnosis Date Noted  . Syncope and  collapse 01/09/2016  . Syncope 10/10/2015  . Fever, unspecified   . Gram-negative infection   . PICC line infection   . SOB (shortness of breath)   . Klebsiella pneumoniae sepsis (HCC) 06/25/2015  . Pulmonary hypertension 06/25/2015  . Lung nodule   . Discitis of thoracic region   . Malnutrition of moderate degree 06/07/2015  . Leukocytosis   . Acute blood loss anemia   . Bacteremia   . MSSA (methicillin susceptible Staphylococcus aureus) septicemia (HCC) 06/05/2015  . Atrial fibrillation (HCC) [I48.91] 03/31/2015  . Long term (current) use of anticoagulants [Z79.01] 03/31/2015  . Non compliance w medication regimen 03/28/2015  . Protein-calorie malnutrition, severe (HCC) 03/28/2015  . LV (left ventricular) mural thrombus   . Paroxysmal atrial fibrillation (HCC) 09/29/2014  . Chronic systolic CHF (congestive heart failure) (HCC) 08/07/2014  . NICM (nonischemic cardiomyopathy) (HCC) 07/24/2014  . 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 Pericardial effusion 07/24/2014    Past Surgical History:  Procedure Laterality Date  . CARDIAC CATHETERIZATION N/A 02/07/2015   Procedure: Right Heart Cath;  Surgeon: Laurey Morale, MD;  Location: [facility] INVASIVE CV LAB;  Service: Cardiovascular;  Laterality: N/A;  . LEFT AND RIGHT HEART CATHETERIZATION WITH CORONARY ANGIOGRAM N/A 07/23/2014   Procedure: LEFT AND RIGHT HEART CATHETERIZATION WITH CORONARY ANGIOGRAM;  Surgeon: Laurey Morale, MD;  Location: [facility]  CATH LAB;  Service: Cardiovascular;  Laterality: N/A;  . PERIPHERALLY INSERTED CENTRAL CATHETER INSERTION  10/10/2015  . PLEURAL EFFUSION DRAINAGE N/A 07/26/2014   Procedure: DRAINAGE OF PERICARDIAL EFFUSION;  Surgeon: Delight OvensEdward B Gerhardt, MD;  [facility];  Service: Thoracic;  Laterality: N/A;  . RADIOLOGY WITH ANESTHESIA N/A 06/08/2015   Procedure: MRI CERVICAL SPINE WITH/WITHOUT LUMBAR WITH/WITHOUT THORACIC WITH/WITHOUT       (RADIOLOGY WITH  ANESTHESIA);  Surgeon: Medication Radiologist, MD;  Location: [facility] OR;  Service: Radiology;  Laterality: N/A;  . SUBXYPHOID PERICARDIAL WINDOW N/A 07/26/2014   Procedure: SUBXYPHOID PERICARDIAL WINDOW;  Surgeon: Delight OvensEdward B Gerhardt, MD;  [facility];  Service: Thoracic;  Laterality: N/A;  . TEE WITHOUT CARDIOVERSION N/A 07/26/2014   Procedure: TRANSESOPHAGEAL ECHOCARDIOGRAM (TEE);  Surgeon: Delight OvensEdward B Gerhardt, MD;  [facility];  Service: Thoracic;  Laterality: N/A;  . TEE WITHOUT CARDIOVERSION N/A 06/06/2015   Procedure: TRANSESOPHAGEAL ECHOCARDIOGRAM (TEE);  Surgeon: Dolores Pattyaniel R Bensimhon, MD;  [facility];  Service: Cardiovascular;  Laterality: N/A;    OB History    No data available       Home Medications    Prior to Admission medications   Medication Sig Start Date End Date Taking? Authorizing Provider  amiodarone (PACERONE) 200 MG tablet Take 1 tablet (200 mg total) by mouth daily. 10/12/15   Graciella FreerMichael Andrew Tillery, PA-C  feeding supplement (BOOST / RESOURCE BREEZE) LIQD Take 1 Container by mouth 2 (two) times daily between meals. Patient taking differently: Take 1 Container by mouth daily.  10/12/15   Leana Roeawood S Elgergawy, MD  magnesium oxide (MAG-OX) 400 MG tablet Take 1 tablet (400 mg total) by mouth daily. 02/15/16   Laurey Moralealton S McLean, MD  milrinone [facility](PRIMACOR) 20 MG/100 ML SOLN infusion Inject 11.25 mcg/min into the vein continuous. 09/12/15   Alison MurrayAlma M Devine, MD  oxyCODONE (OXY IR/ROXICODONE) 5 MG immediate release tablet Take 1 tablet (5 mg total) by mouth every 6 (six) hours as needed for moderate pain or severe pain. 10/12/15   Leana Roeawood S Elgergawy, MD  potassium chloride SA (K-DUR,KLOR-CON) 20 MEQ tablet Take 1 tablet (20 mEq total) by mouth every other day. Take with torsemide. 10/12/15   Graciella FreerMichael Andrew Tillery, PA-C  spironolactone (ALDACTONE) 25 MG tablet Take 0.5 tablets (12.5 mg total) by mouth daily. 10/12/15   Graciella FreerMichael Andrew Tillery, PA-C  torsemide (DEMADEX) 20 MG tablet Take  1 tablet (20 mg total) by mouth daily as needed. 01/19/16   Amy D Filbert Schilderlegg, NP  warfarin (COUMADIN) 2 MG tablet Take as directed by [facility]. Patient taking differently: Take 6-8 mg by mouth daily at 6 PM. Takes 6mg  on mon, wed and fri  Takes 8mg  all other days 01/09/16   Laurey Moralealton S McLean, MD    Family History Family History  Problem Relation Age of Onset  . Hypertension Sister   . Diabetes Maternal Grandmother   . Cancer Mother     died young (pt was only in [employer] @ the time)  . Other Father     died in his 5780's.    Social History Social History  Substance Use Topics  . Smoking status: Never Smoker  . Smokeless tobacco: Never Used  . Alcohol use No     Allergies   Patient has no known allergies.   Review of Systems Review of Systems  Respiratory: Positive for shortness of breath.   Skin: Positive for rash.  All other systems reviewed and are negative.  Physical Exam Updated Vital Signs BP 110/75 (BP Location: Left Arm)   Pulse 100   Temp 98.3 F (36.8 C) (Oral)   Resp 18   SpO2 100%   Physical Exam  Constitutional: She is oriented to person, place, and time. She appears well-developed and well-nourished.  HENT:  Head: Normocephalic and atraumatic.  Cardiovascular: Normal rate and regular rhythm.   No murmur heard. Pulmonary/Chest: Effort normal and breath sounds normal. No respiratory distress.  Abdominal: Soft. There is no tenderness. There is no rebound and no guarding.  Musculoskeletal: She exhibits no edema or tenderness.  PICC line in right upper chest wall with dressing that is clean, dry, intact.  Neurological: She is alert and oriented to person, place, and time.  Skin: Skin is warm and dry.  Skin darkening and thickening throughout bilateral lower extremities diffusely with occasional cracking. There is minimal erythema to the left upper thigh. No skin tenderness. Right upper extremity has darkened patches and macules with occasional  cracking. There are no papules or vesicles. No petechiae.  Psychiatric: She has a normal mood and affect. Her behavior is normal.  Nursing note and vitals reviewed.    ED Treatments / Results  Labs (all labs ordered are listed, but only abnormal results are displayed) Labs Reviewed  BASIC METABOLIC PANEL - Abnormal; Notable for the following:       Result Value   Potassium 5.2 (*)    BUN 22 (*)    Creatinine, Ser 1.30 (*)    GFR calc non Af Amer 41 (*)    GFR calc Af Amer 47 (*)    All other components within normal limits  CBC - Abnormal; Notable for the following:    Hemoglobin 11.2 (*)    HCT 35.4 (*)    All other components within normal limits  I-STAT TROPOININ, ED - Abnormal; Notable for the following:    Troponin i, poc 0.24 (*)    All other components within normal limits  PROTIME-INR    EKG  EKG Interpretation  Date/Time:  Wednesday March 21 2016 16:18:04 EST Ventricular Rate:  92 PR Interval:  132 QRS Duration: 138 QT Interval:  444 QTC Calculation: 549 R Axis:   122 Text Interpretation:  Sinus rhythm with Premature atrial complexes Right axis deviation Non-specific intra-ventricular conduction block T wave abnormality, consider inferior ischemia Abnormal ECG Confirmed by Lincoln Brigham [phone]) on 03/21/2016 5:18:49 PM       Radiology No results found.  Procedures Procedures (including critical care time)  Medications Ordered in ED Medications - No data to display   Initial Impression / Assessment and Plan / ED Course  I have reviewed the triage vital signs and the nursing notes.  Pertinent labs & imaging results that were available during my care of the patient were reviewed by me and considered in my medical decision making (see chart for details).  Clinical Course     Patient here with progressive shortness of breath and itchy rash to bilateral lower extremities, low back, right upper extremity. Rash is concerning for skin toxicity related to  amiodarone versus Coumadin. Cardiology consult for medication recommendations. Hospitalist was contacted for admission for further management. Discussed with patient findings studies recommendation for admission and she is not agreement with plan. Troponin is elevated but the patient is without any active chest pain and EKG is unchanged from priors.  Final Clinical Impressions(s) / ED Diagnoses   Final diagnoses:  None    New Prescriptions New Prescriptions  No medications on file     Tilden Fossa, MD 03/22/16 1655

## 2016-03-21 NOTE — Progress Notes (Signed)
ANTICOAGULATION CONSULT NOTE - Initial Consult  Pharmacy Consult for heparin Indication: atrial fibrillation and LV thrombus  No Known Allergies  Patient Measurements:   Heparin Dosing Weight: 46kg  Vital Signs: Temp: 98.2 F (36.8 C) (12/27 1727) Temp Source: Oral (12/27 1727) BP: 115/80 (12/27 1945) Pulse Rate: 102 (12/27 1945)  Labs:  Recent Labs  03/21/16 1629 03/21/16 1752  HGB 11.2*  --   HCT 35.4*  --   PLT 304  --   LABPROT  --  17.8*  INR  --  1.45  CREATININE 1.30*  --     CrCl cannot be calculated (Unknown ideal weight.).   Medical History: Past Medical History:  Diagnosis Date  . Anemia   . Bacteremia    a. MSSA bacteremia and discitis in 3/17, suspect tunneled catheter infection.   . Blood transfusion without reported diagnosis   . Chronic systolic CHF (congestive heart failure) (HCC)    a. 06/2014 Echo: EF 20%.  . Discitis   . Hypertension   . LV (left ventricular) mural thrombus    a. Noted on 12/16 echo.   Marland Kitchen NICM (nonischemic cardiomyopathy) (HCC)    a. 06/2014 Echo: EF 20%, diff HK, mild MR, mildly dil LA/RA, mildly reduced RV fxn;  b. 06/2014 Cath: nl cors.  . Noncompliance   . PAF (paroxysmal atrial fibrillation) (HCC)    a. with RVR, converted to NSR with amiodarone.  . Pericardial effusion    a. 06/2014 Large effusion, no tamponade, s/p window for diag/therap - neg malig. b. 6/16 - recurrence w/o tamponade, subsequently folloed by echoes. Mod 12/16.  Marland Kitchen Syncope 09/2015    Medications:  Infusions:  . heparin      Assessment: 39 yof presented to the ED with SOB and rash. She is on chronic coumadin for history of afib and LV thrombus but INR is low. H/H slightly low but platelets are WNL.   Goal of Therapy:  Heparin level 0.3-0.7 units/ml Monitor platelets by anticoagulation protocol: Yes   Plan:  Heparin bolus 2500 units IV x 1 Heparin gtt 800 units/hr Check an 8 hr heparin level Daily heparin level and CBC  Stanely Sexson, Drake Leach 03/21/2016,9:06 PM

## 2016-03-22 ENCOUNTER — Inpatient Hospital Stay (HOSPITAL_COMMUNITY): Admission: RE | Admit: 2016-03-22 | Payer: Medicare Other | Source: Ambulatory Visit

## 2016-03-22 ENCOUNTER — Encounter (HOSPITAL_COMMUNITY): Payer: Self-pay | Admitting: Internal Medicine

## 2016-03-22 DIAGNOSIS — L308 Other specified dermatitis: Secondary | ICD-10-CM | POA: Diagnosis not present

## 2016-03-22 DIAGNOSIS — I4581 Long QT syndrome: Secondary | ICD-10-CM | POA: Diagnosis present

## 2016-03-22 DIAGNOSIS — I5022 Chronic systolic (congestive) heart failure: Secondary | ICD-10-CM | POA: Diagnosis not present

## 2016-03-22 DIAGNOSIS — I420 Dilated cardiomyopathy: Secondary | ICD-10-CM | POA: Diagnosis not present

## 2016-03-22 DIAGNOSIS — I5023 Acute on chronic systolic (congestive) heart failure: Secondary | ICD-10-CM

## 2016-03-22 DIAGNOSIS — Z8249 Family history of ischemic heart disease and other diseases of the circulatory system: Secondary | ICD-10-CM | POA: Diagnosis not present

## 2016-03-22 DIAGNOSIS — I480 Paroxysmal atrial fibrillation: Secondary | ICD-10-CM | POA: Diagnosis not present

## 2016-03-22 DIAGNOSIS — R21 Rash and other nonspecific skin eruption: Principal | ICD-10-CM

## 2016-03-22 DIAGNOSIS — N179 Acute kidney failure, unspecified: Secondary | ICD-10-CM | POA: Diagnosis present

## 2016-03-22 DIAGNOSIS — I130 Hypertensive heart and chronic kidney disease with heart failure and stage 1 through stage 4 chronic kidney disease, or unspecified chronic kidney disease: Secondary | ICD-10-CM | POA: Diagnosis present

## 2016-03-22 DIAGNOSIS — Z833 Family history of diabetes mellitus: Secondary | ICD-10-CM | POA: Diagnosis not present

## 2016-03-22 DIAGNOSIS — L299 Pruritus, unspecified: Secondary | ICD-10-CM | POA: Diagnosis present

## 2016-03-22 DIAGNOSIS — L723 Sebaceous cyst: Secondary | ICD-10-CM | POA: Diagnosis not present

## 2016-03-22 DIAGNOSIS — I428 Other cardiomyopathies: Secondary | ICD-10-CM | POA: Diagnosis present

## 2016-03-22 DIAGNOSIS — D649 Anemia, unspecified: Secondary | ICD-10-CM | POA: Diagnosis present

## 2016-03-22 DIAGNOSIS — I513 Intracardiac thrombosis, not elsewhere classified: Secondary | ICD-10-CM | POA: Diagnosis not present

## 2016-03-22 DIAGNOSIS — I447 Left bundle-branch block, unspecified: Secondary | ICD-10-CM | POA: Diagnosis present

## 2016-03-22 DIAGNOSIS — R55 Syncope and collapse: Secondary | ICD-10-CM | POA: Diagnosis not present

## 2016-03-22 DIAGNOSIS — Z681 Body mass index (BMI) 19 or less, adult: Secondary | ICD-10-CM | POA: Diagnosis not present

## 2016-03-22 DIAGNOSIS — I272 Pulmonary hypertension, unspecified: Secondary | ICD-10-CM | POA: Diagnosis not present

## 2016-03-22 DIAGNOSIS — Z7901 Long term (current) use of anticoagulants: Secondary | ICD-10-CM | POA: Diagnosis not present

## 2016-03-22 DIAGNOSIS — I8291 Chronic embolism and thrombosis of unspecified vein: Secondary | ICD-10-CM | POA: Diagnosis present

## 2016-03-22 LAB — CBC WITH DIFFERENTIAL/PLATELET
Basophils Absolute: 0 10*3/uL (ref 0.0–0.1)
Basophils Relative: 1 %
EOS PCT: 2 %
Eosinophils Absolute: 0.1 10*3/uL (ref 0.0–0.7)
HEMATOCRIT: 31.9 % — AB (ref 36.0–46.0)
Hemoglobin: 10.2 g/dL — ABNORMAL LOW (ref 12.0–15.0)
LYMPHS PCT: 23 %
Lymphs Abs: 1.6 10*3/uL (ref 0.7–4.0)
MCH: 27.4 pg (ref 26.0–34.0)
MCHC: 32 g/dL (ref 30.0–36.0)
MCV: 85.8 fL (ref 78.0–100.0)
MONO ABS: 0.7 10*3/uL (ref 0.1–1.0)
MONOS PCT: 10 %
Neutro Abs: 4.5 10*3/uL (ref 1.7–7.7)
Neutrophils Relative %: 64 %
PLATELETS: 278 10*3/uL (ref 150–400)
RBC: 3.72 MIL/uL — ABNORMAL LOW (ref 3.87–5.11)
RDW: 15.7 % — AB (ref 11.5–15.5)
WBC: 7 10*3/uL (ref 4.0–10.5)

## 2016-03-22 LAB — COOXEMETRY PANEL
CARBOXYHEMOGLOBIN: 1 % (ref 0.5–1.5)
METHEMOGLOBIN: 0.9 % (ref 0.0–1.5)
O2 Saturation: 97.2 %
TOTAL HEMOGLOBIN: 10.5 g/dL — AB (ref 12.0–16.0)

## 2016-03-22 LAB — COMPREHENSIVE METABOLIC PANEL
ALBUMIN: 2.7 g/dL — AB (ref 3.5–5.0)
ALK PHOS: 94 U/L (ref 38–126)
ALT: 25 U/L (ref 14–54)
AST: 28 U/L (ref 15–41)
Anion gap: 6 (ref 5–15)
BILIRUBIN TOTAL: 1.3 mg/dL — AB (ref 0.3–1.2)
BUN: 26 mg/dL — AB (ref 6–20)
CALCIUM: 8.6 mg/dL — AB (ref 8.9–10.3)
CO2: 24 mmol/L (ref 22–32)
Chloride: 114 mmol/L — ABNORMAL HIGH (ref 101–111)
Creatinine, Ser: 1.35 mg/dL — ABNORMAL HIGH (ref 0.44–1.00)
GFR calc Af Amer: 45 mL/min — ABNORMAL LOW (ref >60)
GFR calc non Af Amer: 39 mL/min — ABNORMAL LOW (ref >60)
GLUCOSE: 94 mg/dL (ref 65–99)
POTASSIUM: 4.1 mmol/L (ref 3.5–5.1)
SODIUM: 144 mmol/L (ref 135–145)
TOTAL PROTEIN: 5.9 g/dL — AB (ref 6.5–8.1)

## 2016-03-22 LAB — SEDIMENTATION RATE: Sed Rate: 7 mm/hr (ref 0–22)

## 2016-03-22 LAB — TSH: TSH: 1.844 u[IU]/mL (ref 0.350–4.500)

## 2016-03-22 LAB — HEPARIN LEVEL (UNFRACTIONATED)
HEPARIN UNFRACTIONATED: 0.47 [IU]/mL (ref 0.30–0.70)
Heparin Unfractionated: 0.24 IU/mL — ABNORMAL LOW (ref 0.30–0.70)
Heparin Unfractionated: 0.36 IU/mL (ref 0.30–0.70)

## 2016-03-22 LAB — C-REACTIVE PROTEIN: CRP: 0.9 mg/dL (ref <1.0)

## 2016-03-22 LAB — MRSA PCR SCREENING: MRSA by PCR: NEGATIVE

## 2016-03-22 LAB — TROPONIN I: Troponin I: 0.25 ng/mL (ref <0.03)

## 2016-03-22 MED ORDER — MAGNESIUM OXIDE 400 (241.3 MG) MG PO TABS
400.0000 mg | ORAL_TABLET | Freq: Every day | ORAL | Status: DC
  Administered 2016-03-22 – 2016-03-24 (×3): 400 mg via ORAL
  Filled 2016-03-22 (×4): qty 1

## 2016-03-22 MED ORDER — BACITRACIN ZINC 500 UNIT/GM EX OINT
TOPICAL_OINTMENT | CUTANEOUS | Status: DC | PRN
  Filled 2016-03-22: qty 28.35

## 2016-03-22 MED ORDER — OXYCODONE HCL 5 MG PO TABS: 5.0000 mg | ORAL_TABLET | Freq: Four times a day (QID) | ORAL | Status: DC | PRN

## 2016-03-22 MED ORDER — BOOST / RESOURCE BREEZE PO LIQD
1.0000 | ORAL | Status: DC
  Administered 2016-03-22 – 2016-03-23 (×2): 1 via ORAL

## 2016-03-22 MED ORDER — LIDOCAINE-EPINEPHRINE 1 %-1:100000 IJ SOLN: 10.0000 mL | Freq: Once | INTRAMUSCULAR | Status: DC

## 2016-03-22 MED ORDER — FUROSEMIDE 10 MG/ML IJ SOLN
60.0000 mg | Freq: Two times a day (BID) | INTRAMUSCULAR | Status: DC
Start: 2016-03-22 — End: 2016-03-23
  Administered 2016-03-22 (×2): 60 mg via INTRAVENOUS
  Filled 2016-03-22 (×2): qty 6

## 2016-03-22 MED ORDER — METHYLPREDNISOLONE SODIUM SUCC 125 MG IJ SOLR
50.0000 mg | INTRAMUSCULAR | Status: DC
  Administered 2016-03-22 – 2016-03-24 (×3): 50 mg via INTRAVENOUS
  Filled 2016-03-22 (×4): qty 2

## 2016-03-22 MED ORDER — POTASSIUM CHLORIDE CRYS ER 20 MEQ PO TBCR: 20.0000 meq | EXTENDED_RELEASE_TABLET | ORAL | Status: DC

## 2016-03-22 MED ORDER — ACETAMINOPHEN 325 MG PO TABS: 650.0000 mg | ORAL_TABLET | Freq: Four times a day (QID) | ORAL | Status: DC | PRN

## 2016-03-22 MED ORDER — DIPHENHYDRAMINE HCL 50 MG/ML IJ SOLN
25.0000 mg | Freq: Four times a day (QID) | INTRAMUSCULAR | Status: DC | PRN
  Administered 2016-03-24: 25 mg via INTRAVENOUS
  Filled 2016-03-22: qty 1

## 2016-03-22 MED ORDER — ONDANSETRON HCL 4 MG PO TABS: 4.0000 mg | ORAL_TABLET | Freq: Four times a day (QID) | ORAL | Status: DC | PRN

## 2016-03-22 MED ORDER — ONDANSETRON HCL 4 MG/2ML IJ SOLN: 4.0000 mg | Freq: Four times a day (QID) | INTRAMUSCULAR | Status: DC | PRN

## 2016-03-22 MED ORDER — POTASSIUM CHLORIDE CRYS ER 20 MEQ PO TBCR
20.0000 meq | EXTENDED_RELEASE_TABLET | Freq: Every day | ORAL | Status: DC
  Administered 2016-03-22: 20 meq via ORAL
  Filled 2016-03-22: qty 1

## 2016-03-22 MED ORDER — LIDOCAINE HCL (PF) 2 % IJ SOLN
0.0000 mL | Freq: Once | INTRAMUSCULAR | Status: DC | PRN
  Filled 2016-03-22: qty 20

## 2016-03-22 MED ORDER — MILRINONE LACTATE IN DEXTROSE 20-5 MG/100ML-% IV SOLN
0.2500 ug/kg/min | INTRAVENOUS | Status: DC
  Administered 2016-03-22 – 2016-03-24 (×3): 0.25 ug/kg/min via INTRAVENOUS
  Filled 2016-03-22 (×3): qty 100

## 2016-03-22 MED ORDER — ACETAMINOPHEN 650 MG RE SUPP: 650.0000 mg | Freq: Four times a day (QID) | RECTAL | Status: DC | PRN

## 2016-03-22 MED ORDER — SPIRONOLACTONE 25 MG PO TABS
12.5000 mg | ORAL_TABLET | Freq: Every day | ORAL | Status: DC
Start: 2016-03-22 — End: 2016-03-25
  Administered 2016-03-22 – 2016-03-24 (×3): 12.5 mg via ORAL
  Filled 2016-03-22 (×3): qty 1

## 2016-03-22 NOTE — Consult Note (Signed)
[facility] Consult/Admission Note  Katrina Peck [birth year]  [id number].    Requesting MD: Dr. Sloan Leiter Chief Complaint/Reason for Consult: Skin biopsy  HPI:   Katrina Peck is a 70 y.o. female with nonischemic cardiomyopathy last EF measured in October 2017 is 10% on milrinone infusion who presented to the [facility] because of a progressively worsening dry/scaly rash of the bilateral lower extremity with itching and burning pain for 3-4 weeks. Pt states rash started on her bilateral ankles/feet and has moved proximally to her upper thighs. Pt states a mild intermittent burning sensation associated with this rash. She has tried oxycodone for the pain without relief. She has tried Vaseline and hydrocortisone cream on the rash without relief. Denies any involvement of the mucosa. Denies any insect bites, travel, being in the woods. No discharge. In addition patient has increasing shortness of breath on exertion.   We have been consulted for a skin biopsy as internal medicine is concerned this rash may be due to Coumadin toxicity.   ED Course: Cardiology was consulted. Patient was placed on Lasix IV and heparin and Coumadin and amiodarone discontinued.   ROS:  Review of Systems  Constitutional: Negative for chills, diaphoresis and fever.  Eyes: Negative for pain, discharge and redness.  Respiratory: Positive for shortness of breath. Negative for cough and sputum production.   Cardiovascular: Positive for leg swelling (intermittently). Negative for chest pain.  Gastrointestinal: Negative for abdominal pain, blood in stool, constipation, diarrhea, nausea and vomiting.  Genitourinary: Negative for hematuria.  Skin: Positive for itching and rash.  Neurological: Negative for dizziness, sensory change, focal weakness, loss of consciousness and headaches.  All other systems reviewed and are negative.    Family History  Problem Relation Age of Onset  . Hypertension Sister   .  Diabetes Maternal Grandmother   . Cancer Mother     died young (pt was only in [employer] @ the time)  . Other Father     died in his 65's.    Past Medical History:  Diagnosis Date  . Anemia   . Bacteremia    a. MSSA bacteremia and discitis in 3/17, suspect tunneled catheter infection.   . Blood transfusion without reported diagnosis   . Chronic systolic CHF (congestive heart failure) ([place])    a. 06/2014 Echo: EF 20%.  . Discitis   . Hypertension   . LV (left ventricular) mural thrombus    a. Noted on 12/16 echo.   Marland Kitchen NICM (nonischemic cardiomyopathy) ([place])    a. 06/2014 Echo: EF 20%, diff HK, mild MR, mildly dil LA/RA, mildly reduced RV fxn;  b. 06/2014 Cath: nl cors.  . Noncompliance   . PAF (paroxysmal atrial fibrillation) (HCC)    a. with RVR, converted to NSR with amiodarone.  . Pericardial effusion    a. 06/2014 Large effusion, no tamponade, s/p window for diag/therap - neg malig. b. 6/16 - recurrence w/o tamponade, subsequently folloed by echoes. Mod 12/16.  Marland Kitchen Syncope 09/2015    Past Surgical History:  Procedure Laterality Date  . CARDIAC CATHETERIZATION N/A 02/07/2015   Procedure: Right Heart Cath;  Surgeon: Larey Dresser, MD;  Location: [place] CV LAB;  Service: Cardiovascular;  Laterality: N/A;  . LEFT AND RIGHT HEART CATHETERIZATION WITH CORONARY ANGIOGRAM N/A 07/23/2014   Procedure: LEFT AND RIGHT HEART CATHETERIZATION WITH CORONARY ANGIOGRAM;  Surgeon: Larey Dresser, MD;  Location: [facility] CATH LAB;  Service: Cardiovascular;  Laterality: N/A;  . PERIPHERALLY INSERTED CENTRAL CATHETER  INSERTION  10/10/2015  . PLEURAL EFFUSION DRAINAGE N/A 07/26/2014   Procedure: DRAINAGE OF PERICARDIAL EFFUSION;  Surgeon: Grace Isaac, MD;  Location: [place];  Service: Thoracic;  Laterality: N/A;  . RADIOLOGY WITH ANESTHESIA N/A 06/08/2015   Procedure: MRI CERVICAL SPINE WITH/WITHOUT LUMBAR WITH/WITHOUT THORACIC WITH/WITHOUT       (RADIOLOGY WITH ANESTHESIA);  Surgeon: Medication  Radiologist, MD;  Location: [place];  Service: Radiology;  Laterality: N/A;  . SUBXYPHOID PERICARDIAL WINDOW N/A 07/26/2014   Procedure: SUBXYPHOID PERICARDIAL WINDOW;  Surgeon: Grace Isaac, MD;  Location: [place];  Service: Thoracic;  Laterality: N/A;  . TEE WITHOUT CARDIOVERSION N/A 07/26/2014   Procedure: TRANSESOPHAGEAL ECHOCARDIOGRAM (TEE);  Surgeon: Grace Isaac, MD;  Location: [place];  Service: Thoracic;  Laterality: N/A;  . TEE WITHOUT CARDIOVERSION N/A 06/06/2015   Procedure: TRANSESOPHAGEAL ECHOCARDIOGRAM (TEE);  Surgeon: Jolaine Artist, MD;  [facility]OSCOPY;  Service: Cardiovascular;  Laterality: N/A;    Social History:  reports that she has never smoked. She has never used smokeless tobacco. She reports that she does not drink alcohol or use drugs.  Allergies: No Known Allergies   (Not in a [facility] admission)  Blood pressure 114/71, pulse 94, temperature 98.2 F (36.8 C), temperature source Oral, resp. rate 16, weight 99 lb 3.3 oz (45 kg), SpO2 99 %.  Physical Exam: General: pleasant, WD/WN thin AA elderly female who is laying in bed in NAD HEENT: head is normocephalic, atraumatic.  Sclera are noninjected, no scleral icterus, lids are normal, no discharge.  PERRL.  Oral mucosa is pink and moist, no oral lesion noted Heart: regular, rate, and rhythm.  No obvious murmurs, gallops, or rubs noted.  2+  DP pulses bilaterally Lungs: CTAB, no wheezes, rhonchi, or rales noted.  Respiratory effort nonlabored Abd: soft, NT/ND, +BS, no masses or hernias appreciated MS: all 4 extremities are symmetrical, full ROM, no edema Skin: warm and dry, rash noted to BLE that is dry and scaly appearing, non blanching that extends from the feet proximally to the thighs bilaterally Psych: A&Ox3 with an appropriate affect. Neuro: CM grossly 2-12 intact, normal speech  Results for orders placed or performed during the [facility] encounter of 03/21/16 (from the past 48 hour(s))  Basic  metabolic panel     Status: Abnormal   Collection Time: 03/21/16  4:29 PM  Result Value Ref Range   Sodium 141 135 - 145 mmol/L   Potassium 5.2 (H) 3.5 - 5.1 mmol/L   Chloride 110 101 - 111 mmol/L   CO2 23 22 - 32 mmol/L   Glucose, Bld 94 65 - 99 mg/dL   BUN 22 (H) 6 - 20 mg/dL   Creatinine, Ser 1.30 (H) 0.44 - 1.00 mg/dL   Calcium 9.2 8.9 - 10.3 mg/dL   GFR calc non Af Amer 41 (L) >60 mL/min   GFR calc Af Amer 47 (L) >60 mL/min    Comment: (NOTE) The eGFR has been calculated using the CKD EPI equation. This calculation has not been validated in all clinical situations. eGFR's persistently <60 mL/min signify possible Chronic Kidney Disease.    Anion gap 8 5 - 15  CBC     Status: Abnormal   Collection Time: 03/21/16  4:29 PM  Result Value Ref Range   WBC 9.7 4.0 - 10.5 K/uL   RBC 4.10 3.87 - 5.11 MIL/uL   Hemoglobin 11.2 (L) 12.0 - 15.0 g/dL   HCT 35.4 (L) 36.0 - 46.0 %   MCV  86.3 78.0 - 100.0 fL   MCH 27.3 26.0 - 34.0 pg   MCHC 31.6 30.0 - 36.0 g/dL   RDW 15.5 11.5 - 15.5 %   Platelets 304 150 - 400 K/uL  I-stat troponin, ED     Status: Abnormal   Collection Time: 03/21/16  4:42 PM  Result Value Ref Range   Troponin i, poc 0.24 (HH) 0.00 - 0.08 ng/mL   Comment NOTIFIED PHYSICIAN    Comment 3            Comment: Due to the release kinetics of cTnI, a negative result within the first hours of the onset of symptoms does not rule out myocardial infarction with certainty. If myocardial infarction is still suspected, repeat the test at appropriate intervals.   Protime-INR     Status: Abnormal   Collection Time: 03/21/16  5:52 PM  Result Value Ref Range   Prothrombin Time 17.8 (H) 11.4 - 15.2 seconds   INR 1.45   Brain natriuretic peptide     Status: Abnormal   Collection Time: 03/21/16  5:52 PM  Result Value Ref Range   B Natriuretic Peptide >4,500.0 (H) 0.0 - 100.0 pg/mL  Sedimentation rate     Status: None   Collection Time: 03/22/16  1:27 AM  Result Value Ref  Range   Sed Rate 7 0 - 22 mm/hr  C-reactive protein     Status: None   Collection Time: 03/22/16  1:27 AM  Result Value Ref Range   CRP 0.9 <1.0 mg/dL  Troponin I     Status: Abnormal   Collection Time: 03/22/16  1:27 AM  Result Value Ref Range   Troponin I 0.25 (HH) <0.03 ng/mL    Comment: CRITICAL RESULT CALLED TO, READ BACK BY AND VERIFIED WITH: PETERS,M RN 03/22/2016 0232 JORDANS   TSH     Status: None   Collection Time: 03/22/16  1:27 AM  Result Value Ref Range   TSH 1.844 0.350 - 4.500 uIU/mL    Comment: Performed by a 3rd Generation assay with a functional sensitivity of <=0.01 uIU/mL.  Comprehensive metabolic panel     Status: Abnormal   Collection Time: 03/22/16  5:22 AM  Result Value Ref Range   Sodium 144 135 - 145 mmol/L   Potassium 4.1 3.5 - 5.1 mmol/L    Comment: DELTA CHECK NOTED   Chloride 114 (H) 101 - 111 mmol/L   CO2 24 22 - 32 mmol/L   Glucose, Bld 94 65 - 99 mg/dL   BUN 26 (H) 6 - 20 mg/dL   Creatinine, Ser 1.35 (H) 0.44 - 1.00 mg/dL   Calcium 8.6 (L) 8.9 - 10.3 mg/dL   Total Protein 5.9 (L) 6.5 - 8.1 g/dL   Albumin 2.7 (L) 3.5 - 5.0 g/dL   AST 28 15 - 41 U/L   ALT 25 14 - 54 U/L   Alkaline Phosphatase 94 38 - 126 U/L   Total Bilirubin 1.3 (H) 0.3 - 1.2 mg/dL   GFR calc non Af Amer 39 (L) >60 mL/min   GFR calc Af Amer 45 (L) >60 mL/min    Comment: (NOTE) The eGFR has been calculated using the CKD EPI equation. This calculation has not been validated in all clinical situations. eGFR's persistently <60 mL/min signify possible Chronic Kidney Disease.    Anion gap 6 5 - 15  CBC WITH DIFFERENTIAL     Status: Abnormal   Collection Time: 03/22/16  5:22 AM  Result Value Ref Range  WBC 7.0 4.0 - 10.5 K/uL   RBC 3.72 (L) 3.87 - 5.11 MIL/uL   Hemoglobin 10.2 (L) 12.0 - 15.0 g/dL   HCT 31.9 (L) 36.0 - 46.0 %   MCV 85.8 78.0 - 100.0 fL   MCH 27.4 26.0 - 34.0 pg   MCHC 32.0 30.0 - 36.0 g/dL   RDW 15.7 (H) 11.5 - 15.5 %   Platelets 278 150 - 400 K/uL    Neutrophils Relative % 64 %   Neutro Abs 4.5 1.7 - 7.7 K/uL   Lymphocytes Relative 23 %   Lymphs Abs 1.6 0.7 - 4.0 K/uL   Monocytes Relative 10 %   Monocytes Absolute 0.7 0.1 - 1.0 K/uL   Eosinophils Relative 2 %   Eosinophils Absolute 0.1 0.0 - 0.7 K/uL   Basophils Relative 1 %   Basophils Absolute 0.0 0.0 - 0.1 K/uL  Cooxemetry Panel (carboxy, met, total hgb, O2 sat)     Status: Abnormal   Collection Time: 03/22/16  5:25 AM  Result Value Ref Range   Total hemoglobin 10.5 (L) 12.0 - 16.0 g/dL   O2 Saturation 97.2 %   Carboxyhemoglobin 1.0 0.5 - 1.5 %   Methemoglobin 0.9 0.0 - 1.5 %  Heparin level (unfractionated)     Status: Abnormal   Collection Time: 03/22/16  5:38 AM  Result Value Ref Range   Heparin Unfractionated 0.24 (L) 0.30 - 0.70 IU/mL    Comment:        IF HEPARIN RESULTS ARE BELOW EXPECTED VALUES, AND PATIENT DOSAGE HAS BEEN CONFIRMED, SUGGEST FOLLOW UP TESTING OF ANTITHROMBIN III LEVELS.    Dg Chest 2 View  Result Date: 03/21/2016 CLINICAL DATA:  Shortness of Breath, chest pain. EXAM: CHEST  2 VIEW COMPARISON:  01/09/2016 FINDINGS: Right Central line remains in place, unchanged. Marked cardiomegaly. No confluent airspace opacities, effusions or edema. No acute bony abnormality. IMPRESSION: Stable marked cardiomegaly.  No active disease. Electronically Signed   By: Rolm Baptise M.D.   On: 03/21/2016 18:25      Assessment/Plan Rash:  - internal medicine stated rash is concerning for Coumadin toxicity.  - will perform skin biopsy    Thank you for the consult.    Kalman Drape, [facility] 03/22/2016, 10:49 AM Pager: [phone] Consults: [phone] Mon-Fri 7:00 am-4:30 pm Sat-Sun 7:00 am-11:30 am

## 2016-03-22 NOTE — Progress Notes (Signed)
PROGRESS NOTE        PATIENT DETAILS Name: Katrina Peck Age: 70 y.o. Sex: female Date of Birth: 1945/09/29 Admit Date: 03/21/2016 Admitting Physician Dewayne Shorter Levora Dredge, MD WUJ:WJXBJ,YNWGNFAOZ, MD  Brief Narrative: Patient is a 70 y.o. female with history of chronic systolic heart failure on milrinone infusion, paroxysmal atrial fibrillation, prior history of pericardial effusion, LV thrombus on anticoagulation with Coumadin presented to the hospital for evaluation of lower extremity rash and worsening shortness of breath. See below for further details.  Subjective: Rash ongoing for 3 weeks-feels it is slightly better. Rash is pruritic  SOB improving  Assessment/Plan: Rash:?drug induced-cards does not think this is related to coumadin or amiodarone-for now both of these are on hold.Dot Been skin pigmentation in her bilateral lower extremities extending up to her thighs (sole not involved) and in her right upper extremity (palms not involved). Rash is also intensely pruritic. ANA, dsDNA, antihistone antibody, Anca panel, HIV, hepatitis serology have been ordered. No dermatology coverage here in the hospital-have asked general surgery for a punch biopsy. We will go ahead and try a course of empiric steroids to see if this helps. If rash worsens (per patient rash improving) or starts involving the mucus membranes-may need transfer to a higher level of care.See pics below  Acute on chronic systolic CHF Echo in 10/17 with severely dilated LV, EF 15%: Volume overloaded on exam, on milrinone. Diuretics defer to cardiology.  LV apical thrombus: INR subtherapeutic-on IV heparin. Continue to hold Coumadin until biopsy results are available.  Paroxysmal Atrial fibrillation:   Currently in NSR. Continue to hold amiodarone as workup underway for rash. Currently on IV heparin.   AKI: Probably hemodynamically mediated in a setting of CHF-follow for now.  Anemia: Suspect due to  chronic disease. Follow for now.  DVT Prophylaxis: Full dose anticoagulation with Heparin  Code Status: Full code   Family Communication: None at bedside  Disposition Plan: Remain inpatient-but will plan on Home health on discharge  Antimicrobial agents: None  Procedures: None  CONSULTS:  cardiology   Gen Surgery  Time spent: 25-minutes-Greater than 50% of this time was spent in counseling, explanation of diagnosis, planning of further management, and coordination of care.  MEDICATIONS: Anti-infectives    None      Scheduled Meds: . feeding supplement  1 Container Oral Q24H  . furosemide  60 mg Intravenous BID  . lidocaine-EPINEPHrine  10 mL Infiltration Once  . magnesium oxide  400 mg Oral Daily  . potassium chloride SA  20 mEq Oral Daily  . spironolactone  12.5 mg Oral Daily   Continuous Infusions: . heparin 900 Units/hr (03/22/16 0636)  . milrinone 0.25 mcg/kg/min (03/22/16 0227)   PRN Meds:.acetaminophen **OR** acetaminophen, lidocaine, ondansetron **OR** ondansetron (ZOFRAN) IV, oxyCODONE   PHYSICAL EXAM: Vital signs: Vitals:   03/22/16 0900 03/22/16 1102 03/22/16 1110 03/22/16 1158  BP: 115/91  110/88 106/72  Pulse: 103 87 97 87  Resp: 22 17 20  (!) 21  Temp:    97.4 F (36.3 C)  TempSrc:    Oral  SpO2: 100% 100% 92% 99%  Weight:    45.5 kg (100 lb 4.8 oz)  Height:    5\' 2"  (1.575 m)   Filed Weights   03/22/16 0000 03/22/16 1158  Weight: 45 kg (99 lb 3.3 oz) 45.5 kg (100 lb 4.8 oz)  Body mass index is 18.35 kg/m.   General appearance :Awake, alert, not in any distress. Eyes:, pupils equally reactive to light and accomodation,no scleral icterus. HEENT: Atraumatic and Normocephalic Neck: supple, no JVD. No cervical lymphadenopathy. No thyromegaly Resp:Good air entry bilaterally, no added sounds  CVS: S1 S2 regular GI: Bowel sounds present, Non tender and not distended with no gaurding, rigidity or rebound.No organomegaly Extremities: B/L  Lower Ext shows trace edema Neurology:  speech clear,Non focal, sensation is grossly intact. Psychiatric: Normal judgment and insight. Alert and oriented x 3. Normal mood. Musculoskeletal:gait appears to be normal. Skin:see pics below               I have personally reviewed following labs and imaging studies  LABORATORY DATA: CBC:  Recent Labs Lab 03/21/16 1629 03/22/16 0522  WBC 9.7 7.0  NEUTROABS  --  4.5  HGB 11.2* 10.2*  HCT 35.4* 31.9*  MCV 86.3 85.8  PLT 304 278    Basic Metabolic Panel:  Recent Labs Lab 03/21/16 1629 03/22/16 0522  NA 141 144  K 5.2* 4.1  CL 110 114*  CO2 23 24  GLUCOSE 94 94  BUN 22* 26*  CREATININE 1.30* 1.35*  CALCIUM 9.2 8.6*    GFR: Estimated Creatinine Clearance: 28.3 mL/min (by C-G formula based on SCr of 1.35 mg/dL (H)).  Liver Function Tests:  Recent Labs Lab 03/22/16 0522  AST 28  ALT 25  ALKPHOS 94  BILITOT 1.3*  PROT 5.9*  ALBUMIN 2.7*   No results for input(s): LIPASE, AMYLASE in the last 168 hours. No results for input(s): AMMONIA in the last 168 hours.  Coagulation Profile:  Recent Labs Lab 03/21/16 1752  INR 1.45    Cardiac Enzymes:  Recent Labs Lab 03/22/16 0127  TROPONINI 0.25*    BNP (last 3 results) No results for input(s): PROBNP in the last 8760 hours.  HbA1C: No results for input(s): HGBA1C in the last 72 hours.  CBG: No results for input(s): GLUCAP in the last 168 hours.  Lipid Profile: No results for input(s): CHOL, HDL, LDLCALC, TRIG, CHOLHDL, LDLDIRECT in the last 72 hours.  Thyroid Function Tests:  Recent Labs  03/22/16 0127  TSH 1.844    Anemia Panel: No results for input(s): VITAMINB12, FOLATE, FERRITIN, TIBC, IRON, RETICCTPCT in the last 72 hours.  Urine analysis:    Component Value Date/Time   COLORURINE YELLOW 01/10/2016 0747   APPEARANCEUR CLEAR 01/10/2016 0747   LABSPEC 1.020 01/10/2016 0747   PHURINE 5.5 01/10/2016 0747   GLUCOSEU NEGATIVE  01/10/2016 0747   HGBUR NEGATIVE 01/10/2016 0747   BILIRUBINUR NEGATIVE 01/10/2016 0747   KETONESUR NEGATIVE 01/10/2016 0747   PROTEINUR NEGATIVE 01/10/2016 0747   UROBILINOGEN 1.0 07/25/2014 2239   NITRITE NEGATIVE 01/10/2016 0747   LEUKOCYTESUR TRACE (A) 01/10/2016 0747    Sepsis Labs: Lactic Acid, Venous    Component Value Date/Time   LATICACIDVEN 2.39 (HH) 01/09/2016 1536    MICROBIOLOGY: No results found for this or any previous visit (from the past 240 hour(s)).  RADIOLOGY STUDIES/RESULTS: Dg Chest 2 View  Result Date: 03/21/2016 CLINICAL DATA:  Shortness of Breath, chest pain. EXAM: CHEST  2 VIEW COMPARISON:  01/09/2016 FINDINGS: Right Central line remains in place, unchanged. Marked cardiomegaly. No confluent airspace opacities, effusions or edema. No acute bony abnormality. IMPRESSION: Stable marked cardiomegaly.  No active disease. Electronically Signed   By: Charlett NoseKevin  Dover M.D.   On: 03/21/2016 18:25     LOS: 0 days  Jeoffrey Massed, MD  [facility] Pager:[phone]  If 7PM-7AM, please contact night-coverage [email or web address] Password [email or web address] 03/22/2016, 1:15 PM

## 2016-03-22 NOTE — ED Notes (Signed)
Report called  

## 2016-03-22 NOTE — H&P (Signed)
History and Physical    Katrina Peck ZOX:[id number] DOB: [birth year] DOA: 03/21/2016  PCP: Maryelizabeth RowanEWEY,ELIZABETH, MD  Patient coming from: Home.  Chief Complaint: Rash and shortness of breath.  HPI: Katrina SchneidersBetty J Kijowski is a 70 y.o. female with nonischemic cardiomyopathy last EF measured in October 2017 is 10% on milrinone infusion presents to the ER because of rash in the lower extremity with itching pain and increasing shortness of breath. Patient's rash started in the lower extremities 3 weeks ago which has progressively moved proximally to the thighs. Denies any involvement of the mucosa. Denies any insect bites or any new drugs. The rash is itching and painful. No discharge. In addition patient has increasing shortness of breath on exertion. Cardiologist on-call was consulted and at this time we have requested to discontinue Coumadin and amiodarone since rash is concerning for Coumadin toxicity.   ED Course: Cardiology was consulted. Patient was placed on Lasix IV and heparin and Coumadin and amiodarone discontinued.  Review of Systems: As per HPI, rest all negative.   Past Medical History:  Diagnosis Date  . Anemia   . Bacteremia    a. MSSA bacteremia and discitis in 3/17, suspect tunneled catheter infection.   . Blood transfusion without reported diagnosis   . Chronic systolic CHF (congestive heart failure) (HCC)    a. 06/2014 Echo: EF 20%.  . Discitis   . Hypertension   . LV (left ventricular) mural thrombus    a. Noted on 12/16 echo.   Marland Kitchen. NICM (nonischemic cardiomyopathy) (HCC)    a. 06/2014 Echo: EF 20%, diff HK, mild MR, mildly dil LA/RA, mildly reduced RV fxn;  b. 06/2014 Cath: nl cors.  . Noncompliance   . PAF (paroxysmal atrial fibrillation) (HCC)    a. with RVR, converted to NSR with amiodarone.  . Pericardial effusion    a. 06/2014 Large effusion, no tamponade, s/p window for diag/therap - neg malig. b. 6/16 - recurrence w/o tamponade, subsequently folloed by echoes. Mod 12/16.    Marland Kitchen. Syncope 09/2015    Past Surgical History:  Procedure Laterality Date  . CARDIAC CATHETERIZATION N/A 02/07/2015   Procedure: Right Heart Cath;  Surgeon: Laurey Moralealton S McLean, MD;  [facility];  Service: Cardiovascular;  Laterality: N/A;  . LEFT AND RIGHT HEART CATHETERIZATION WITH CORONARY ANGIOGRAM N/A 07/23/2014   Procedure: LEFT AND RIGHT HEART CATHETERIZATION WITH CORONARY ANGIOGRAM;  Surgeon: Laurey Moralealton S McLean, MD;  [facility];  Service: Cardiovascular;  Laterality: N/A;  . PERIPHERALLY INSERTED CENTRAL CATHETER INSERTION  10/10/2015  . PLEURAL EFFUSION DRAINAGE N/A 07/26/2014   Procedure: DRAINAGE OF PERICARDIAL EFFUSION;  Surgeon: Delight OvensEdward B Gerhardt, MD;  [facility];  Service: Thoracic;  Laterality: N/A;  . RADIOLOGY WITH ANESTHESIA N/A 06/08/2015   Procedure: MRI CERVICAL SPINE WITH/WITHOUT LUMBAR WITH/WITHOUT THORACIC WITH/WITHOUT       (RADIOLOGY WITH ANESTHESIA);  Surgeon: Medication Radiologist, MD;  Location: [facility] OR;  Service: Radiology;  Laterality: N/A;  . SUBXYPHOID PERICARDIAL WINDOW N/A 07/26/2014   Procedure: SUBXYPHOID PERICARDIAL WINDOW;  Surgeon: Delight OvensEdward B Gerhardt, MD;  [facility];  Service: Thoracic;  Laterality: N/A;  . TEE WITHOUT CARDIOVERSION N/A 07/26/2014   Procedure: TRANSESOPHAGEAL ECHOCARDIOGRAM (TEE);  Surgeon: Delight OvensEdward B Gerhardt, MD;  [facility];  Service: Thoracic;  Laterality: N/A;  . TEE WITHOUT CARDIOVERSION N/A 06/06/2015   Procedure: TRANSESOPHAGEAL ECHOCARDIOGRAM (TEE);  Surgeon: Dolores Pattyaniel R Bensimhon, MD;  [facility];  Service: Cardiovascular;  Laterality: N/A;  reports that she has never smoked. She has never used smokeless tobacco. She reports that she does not drink alcohol or use drugs.  No Known Allergies  Family History  Problem Relation Age of Onset  . Hypertension Sister   . Diabetes Maternal Grandmother   . Cancer Mother     died young (pt was only in [employer] @ the time)  . Other Father      died in his 43's.    Prior to Admission medications   Medication Sig Start Date End Date Taking? Authorizing Provider  amiodarone (PACERONE) 200 MG tablet Take 1 tablet (200 mg total) by mouth daily. 10/12/15  Yes Graciella Freer, PA-C  feeding supplement (BOOST / RESOURCE BREEZE) LIQD Take 1 Container by mouth 2 (two) times daily between meals. Patient taking differently: Take 1 Container by mouth daily.  10/12/15  Yes Starleen Arms, MD  magnesium oxide (MAG-OX) 400 MG tablet Take 1 tablet (400 mg total) by mouth daily. 02/15/16  Yes Laurey Morale, MD  milrinone [facility]) 20 MG/100 ML SOLN infusion Inject 11.25 mcg/min into the vein continuous. 09/12/15  Yes Alison Murray, MD  oxyCODONE (OXY IR/ROXICODONE) 5 MG immediate release tablet Take 1 tablet (5 mg total) by mouth every 6 (six) hours as needed for moderate pain or severe pain. 10/12/15  Yes Leana Roe Elgergawy, MD  potassium chloride SA (K-DUR,KLOR-CON) 20 MEQ tablet Take 1 tablet (20 mEq total) by mouth every other day. Take with torsemide. 10/12/15  Yes Graciella Freer, PA-C  spironolactone (ALDACTONE) 25 MG tablet Take 0.5 tablets (12.5 mg total) by mouth daily. 10/12/15  Yes Graciella Freer, PA-C  torsemide (DEMADEX) 20 MG tablet Take 1 tablet (20 mg total) by mouth daily as needed. 01/19/16  Yes Amy D Clegg, NP  warfarin (COUMADIN) 2 MG tablet Take as directed by [facility]. Patient taking differently: Take 6-8 mg by mouth daily at 6 PM. Takes 6mg  on mon, wed and fri  Takes 8mg  all other days 01/09/16  Yes Laurey Morale, MD    Physical Exam: Vitals:   03/21/16 1945 03/21/16 2200 03/21/16 2230 03/21/16 2245  BP: 115/80 119/81 120/78 141/96  Pulse: 102 104 107 109  Resp: 25 20 19 21   Temp:      TempSrc:      SpO2: 100% 95% 100% 100%      Constitutional: Moderately built and nourished. Vitals:   03/21/16 1945 03/21/16 2200 03/21/16 2230 03/21/16 2245  BP: 115/80 119/81 120/78 141/96  Pulse:  102 104 107 109  Resp: 25 20 19 21   Temp:      TempSrc:      SpO2: 100% 95% 100% 100%   Eyes: Anicteric no pallor. ENMT: No discharge from the ears eyes nose or mouth. Neck: No mass felt. JVD elevated. Respiratory: No rhonchi or crepitations. Cardiovascular: S1 and S2 heard. No murmur appreciated. Abdomen: Soft nontender bowel sounds present. Musculoskeletal: No edema. Skin: Rash with some scaling extending from the foot to the thigh bilaterally. Neurologic: Alert awake oriented to time place and person. Moves all extremities. Psychiatric: Appears normal. Normal affect.   Labs on Admission: I have personally reviewed following labs and imaging studies  CBC:  Recent Labs Lab 03/21/16 1629  WBC 9.7  HGB 11.2*  HCT 35.4*  MCV 86.3  PLT 304   Basic Metabolic Panel:  Recent Labs Lab 03/21/16 1629  NA 141  K 5.2*  CL 110  CO2 23  GLUCOSE 94  BUN 22*  CREATININE 1.30*  CALCIUM 9.2   GFR: CrCl cannot be calculated (Unknown ideal weight.). Liver Function Tests: No results for input(s): AST, ALT, ALKPHOS, BILITOT, PROT, ALBUMIN in the last 168 hours. No results for input(s): LIPASE, AMYLASE in the last 168 hours. No results for input(s): AMMONIA in the last 168 hours. Coagulation Profile:  Recent Labs Lab 03/21/16 1752  INR 1.45   Cardiac Enzymes: No results for input(s): CKTOTAL, CKMB, CKMBINDEX, TROPONINI in the last 168 hours. BNP (last 3 results) No results for input(s): PROBNP in the last 8760 hours. HbA1C: No results for input(s): HGBA1C in the last 72 hours. CBG: No results for input(s): GLUCAP in the last 168 hours. Lipid Profile: No results for input(s): CHOL, HDL, LDLCALC, TRIG, CHOLHDL, LDLDIRECT in the last 72 hours. Thyroid Function Tests: No results for input(s): TSH, T4TOTAL, FREET4, T3FREE, THYROIDAB in the last 72 hours. Anemia Panel: No results for input(s): VITAMINB12, FOLATE, FERRITIN, TIBC, IRON, RETICCTPCT in the last 72 hours. Urine  analysis:    Component Value Date/Time   COLORURINE YELLOW 01/10/2016 0747   APPEARANCEUR CLEAR 01/10/2016 0747   LABSPEC 1.020 01/10/2016 0747   PHURINE 5.5 01/10/2016 0747   GLUCOSEU NEGATIVE 01/10/2016 0747   HGBUR NEGATIVE 01/10/2016 0747   BILIRUBINUR NEGATIVE 01/10/2016 0747   KETONESUR NEGATIVE 01/10/2016 0747   PROTEINUR NEGATIVE 01/10/2016 0747   UROBILINOGEN 1.0 07/25/2014 2239   NITRITE NEGATIVE 01/10/2016 0747   LEUKOCYTESUR TRACE (A) 01/10/2016 0747   Sepsis Labs: @LABRCNTIP (procalcitonin:4,lacticidven:4) )No results found for this or any previous visit (from the past 240 hour(s)).   Radiological Exams on Admission: Dg Chest 2 View  Result Date: 03/21/2016 CLINICAL DATA:  Shortness of Breath, chest pain. EXAM: CHEST  2 VIEW COMPARISON:  01/09/2016 FINDINGS: Right Central line remains in place, unchanged. Marked cardiomegaly. No confluent airspace opacities, effusions or edema. No acute bony abnormality. IMPRESSION: Stable marked cardiomegaly.  No active disease. Electronically Signed   By: Charlett Nose M.D.   On: 03/21/2016 18:25    EKG: Independently reviewed. Normal sinus rhythm with IVCD.  Assessment/Plan Principal Problem:   Rash Active Problems:   NICM (nonischemic cardiomyopathy) (HCC)   Chronic systolic CHF (congestive heart failure) (HCC)   Paroxysmal atrial fibrillation (HCC)   Skin rash    1. Lower extremity rash - cause not clear. Will check sedimentation rate and CRP and ANA. Holding off Coumadin and amiodarone as recommended by cardiologist for concern for Coumadin toxicity. 2. Chronic systolic heart failure last EF measured in October 2017 was 10% - patient is placed on Lasix 20 mg IV every 12. Continue spironolactone and milrinone infusion. 3. Paroxysmal atrial fibrillation - presently on heparin due to possible Coumadin toxicity. Amiodarone on hold. 4. Chronic anemia - follow CBC. 5. Elevated creatinine from baseline - ? Cardiorenal syndrome.  Closely follow metabolic panel and intake output.   DVT prophylaxis: Heparin infusion. Code Status: Full code.  Family Communication: Discussed with patient.  Disposition Plan: Home.  Consults called: Cardiology.  Admission status: Observation.    Eduard Clos MD [facility] Pager [phone].  If 7PM-7AM, please contact night-coverage [email or web address] Password [facility]  03/22/2016, 12:38 AM

## 2016-03-22 NOTE — Progress Notes (Signed)
ANTICOAGULATION CONSULT NOTE - Follow-up Consult  Pharmacy Consult for heparin Indication: atrial fibrillation and LV thrombus  No Known Allergies  Patient Measurements: Weight: 99 lb 3.3 oz (45 kg) Heparin Dosing Weight: 46kg  Vital Signs: BP: 102/63 (12/28 0430) Pulse Rate: 94 (12/28 0430)  Labs:  Recent Labs  03/21/16 1629 03/21/16 1752 03/22/16 0127 03/22/16 0522 03/22/16 0538  HGB 11.2*  --   --  10.2*  --   HCT 35.4*  --   --  31.9*  --   PLT 304  --   --  278  --   LABPROT  --  17.8*  --   --   --   INR  --  1.45  --   --   --   HEPARINUNFRC  --   --   --   --  0.24*  CREATININE 1.30*  --   --   --   --   TROPONINI  --   --  0.25*  --   --     Estimated Creatinine Clearance: 29 mL/min (by C-G formula based on SCr of 1.3 mg/dL (H)).   Assessment: 60 yof presented to the ED with SOB and rash. She is on chronic coumadin for history of afib and LV thrombus but INR subtherapeutic on admission. Holding coumadin for now as rash may be coumadin skin necrosis. Bridging with heparin. Heparin level subtherapeutic (0.24) on gtt at 800 units/hr. Hgb down a bit. No issues with line or bleeding reported per RN.  Goal of Therapy:  Heparin level 0.3-0.7 units/ml Monitor platelets by anticoagulation protocol: Yes   Plan:  Increase heparin gtt to 900 units/hr F/u 8 hr heparin level  Christoper Fabian, PharmD, BCPS Clinical pharmacist, pager [phone] 03/22/2016,6:18 AM

## 2016-03-22 NOTE — Progress Notes (Signed)
Patient ID: Katrina Peck, female   DOB: 04/17/1945, 70 y.o.   MRN: [id number]   SUBJECTIVE: Patient on home milrinone admitted with pruritic rash diffusely on legs to thighs, right arm, and lower back.  She came to ER because she got tired of the itching.  Sounds like rash has been present for several weeks.  Coumadin and amiodarone were stopped initially.   She also has been more short of breath over the last few weeks.  Gets dyspneic with moderate exertion.    Scheduled Meds: . feeding supplement  1 Container Oral Q24H  . furosemide  60 mg Intravenous BID  . magnesium oxide  400 mg Oral Daily  . potassium chloride SA  20 mEq Oral Daily  . spironolactone  12.5 mg Oral Daily   Continuous Infusions: . heparin 900 Units/hr (03/22/16 0636)  . milrinone 0.25 mcg/kg/min (03/22/16 0227)   PRN Meds:.acetaminophen **OR** acetaminophen, ondansetron **OR** ondansetron (ZOFRAN) IV, oxyCODONE    Vitals:   03/22/16 0600 03/22/16 0630 03/22/16 0700 03/22/16 0730  BP: 94/65 101/73 101/70 106/78  Pulse: 87 85 81 87  Resp: 23 17 14 18   Temp:      TempSrc:      SpO2: 97% 96% 99% 98%  Weight:       No intake or output data in the 24 hours ending 03/22/16 0846  LABS: Basic Metabolic Panel:  Recent Labs  70/96/28 1629 03/22/16 0522  NA 141 144  K 5.2* 4.1  CL 110 114*  CO2 23 24  GLUCOSE 94 94  BUN 22* 26*  CREATININE 1.30* 1.35*  CALCIUM 9.2 8.6*   Liver Function Tests:  Recent Labs  03/22/16 0522  AST 28  ALT 25  ALKPHOS 94  BILITOT 1.3*  PROT 5.9*  ALBUMIN 2.7*   No results for input(s): LIPASE, AMYLASE in the last 72 hours. CBC:  Recent Labs  03/21/16 1629 03/22/16 0522  WBC 9.7 7.0  NEUTROABS  --  4.5  HGB 11.2* 10.2*  HCT 35.4* 31.9*  MCV 86.3 85.8  PLT 304 278   Cardiac Enzymes:  Recent Labs  03/22/16 0127  TROPONINI 0.25*   BNP: Invalid input(s): POCBNP D-Dimer: No results for input(s): DDIMER in the last 72 hours. Hemoglobin A1C: No results  for input(s): HGBA1C in the last 72 hours. Fasting Lipid Panel: No results for input(s): CHOL, HDL, LDLCALC, TRIG, CHOLHDL, LDLDIRECT in the last 72 hours. Thyroid Function Tests:  Recent Labs  03/22/16 0127  TSH 1.844   Anemia Panel: No results for input(s): VITAMINB12, FOLATE, FERRITIN, TIBC, IRON, RETICCTPCT in the last 72 hours.  RADIOLOGY: Dg Chest 2 View  Result Date: 03/21/2016 CLINICAL DATA:  Shortness of Breath, chest pain. EXAM: CHEST  2 VIEW COMPARISON:  01/09/2016 FINDINGS: Right Central line remains in place, unchanged. Marked cardiomegaly. No confluent airspace opacities, effusions or edema. No acute bony abnormality. IMPRESSION: Stable marked cardiomegaly.  No active disease. Electronically Signed   By: Charlett Nose M.D.   On: 03/21/2016 18:25    PHYSICAL EXAM General: NAD Neck: JVP 14 cm, no thyromegaly or thyroid nodule.  Lungs: Clear to auscultation bilaterally with normal respiratory effort. CV: Lateral PMI.  Heart regular S1/S2 with widely split S2, no S3/S4, no murmur.  No peripheral edema.   Abdomen: Soft, nontender, no hepatosplenomegaly, no distention.  Neurologic: Alert and oriented x 3.  Psych: Normal affect. Extremities: No clubbing or cyanosis.  Skin: Diffuse erythematous macular rash involving calves/thighs, lower back, and right arm.  TELEMETRY: Reviewed telemetry pt in NSR  ASSESSMENT AND PLAN: 70 yo with history of nonischemic cardiomyopathy (end stage on home milrinone), syncope, paroxysmal atrial fibrillation, LV thrombus, chronic LBBB presented for evaluation of rash.  Also reported increased dyspnea.  1. Rash: Diffuse, erythematous/macular and pruritic.  Involves legs, right arm, and lower back.  No new meds recently.  - Coumadin stopped, but suspect unlikely coumadin skin necrosis as she has been on coumadin for a long time now and this is typically a reaction that occurs right after starting coumadin due to transient hypercoagulability.  -  Amiodarone stopped, but not typical amiodarone photosensitivity or blue discoloration.   - Think we need assistance of a dermatologist here.  Hopefully rash is not due to milrinone (she has been on milrinone for a long time now).  2. Acute on chronic systolic CHF: Echo in 10/17 with severely dilated LV, EF 15%.  She does have some volume overload on exam and reports increased dyspnea.  - Will use Lasix 60 mg IV bid for now, follow K and creatinine closely.  Will give KCl 20 daily.  - Continue milrinone 0.25.  - Continue spironolactone 12.5 daily.  - Will need to discuss compliance with torsemide 20 daily with her prior to deciding on her home regimen of torsemide.  - We recently discussed CRT-P upgrade with her to try to get off milrinone, she was not interested at the time.  3. H/o LV apical thrombus: Heparin gtt while off warfarin.  4. Atrial fibrillation: Paroxysmal.  Currently in NSR.  Amiodarone held for the time being while we work out cause of rash.  5. Syncope: Possibly vasovagal in past but cannot rule out VT given cardiomyopathy.  Had been on amiodarone.   Marca AnconaDalton Kerisha Goughnour 03/22/2016 8:57 AM

## 2016-03-22 NOTE — Progress Notes (Addendum)
ANTICOAGULATION CONSULT NOTE - Follow-up Consult  Pharmacy Consult for heparin Indication: atrial fibrillation and LV thrombus  No Known Allergies  Patient Measurements: Height: 5\' 2"  (157.5 cm) Weight: 100 lb 4.8 oz (45.5 kg) IBW/kg (Calculated) : 50.1 Heparin Dosing Weight: 46kg  Vital Signs: Temp: 97.4 F (36.3 C) (12/28 1158) Temp Source: Oral (12/28 1158) BP: 106/72 (12/28 1158) Pulse Rate: 87 (12/28 1158)  Labs:  Recent Labs  03/21/16 1629 03/21/16 1752 03/22/16 0127 03/22/16 0522 03/22/16 0538 03/22/16 1438  HGB 11.2*  --   --  10.2*  --   --   HCT 35.4*  --   --  31.9*  --   --   PLT 304  --   --  278  --   --   LABPROT  --  17.8*  --   --   --   --   INR  --  1.45  --   --   --   --   HEPARINUNFRC  --   --   --   --  0.24* 0.36  CREATININE 1.30*  --   --  1.35*  --   --   TROPONINI  --   --  0.25*  --   --   --     Estimated Creatinine Clearance: 28.3 mL/min (by C-G formula based on SCr of 1.35 mg/dL (H)).   Assessment: 50 yof presented to the ED with SOB and rash. She is on chronic coumadin for history of afib and LV thrombus but INR subtherapeutic on admission. Holding coumadin for now as rash may be coumadin skin necrosis. Bridging with heparin.   Heparin level this afternoon is now therapeutic after a rate increase earlier today (HL 0.36 << 0.24, goal of 0.3-0.7). No s/sx of bleeding noted.   Goal of Therapy:  Heparin level 0.3-0.7 units/ml Monitor platelets by anticoagulation protocol: Yes   Plan:  1. Continue Heparin at 900 units/hr (9 ml/hr) 2. Will continue to monitor for any signs/symptoms of bleeding and will follow up with heparin level in 6 hours to confirm   Thank you for allowing pharmacy to be a part of this patient's care.  Georgina Pillion, PharmD, BCPS Clinical Pharmacist Pager: [phone] 03/22/2016 3:55 PM    ----------------------------------------------------------------------------------------------- Addendum:  Heparin  level this evening continues to be therapeutic (HL 0.47 << 0.36). No bleeding noted at this time.   Plan 1. Continue Heparin at 900 units/hr (9 ml/hr) 2. Will follow-up with a heparin level in the AM  Thank you for allowing pharmacy to be a part of this patient's care.  Georgina Pillion, PharmD, BCPS Clinical Pharmacist Pager: [phone] 03/22/2016 10:09 PM

## 2016-03-23 ENCOUNTER — Encounter (HOSPITAL_COMMUNITY): Payer: Self-pay | Admitting: General Surgery

## 2016-03-23 HISTORY — PX: BIOPSY (PUNCH) SKIN: PRO1006

## 2016-03-23 LAB — BASIC METABOLIC PANEL
ANION GAP: 9 (ref 5–15)
ANION GAP: 9 (ref 5–15)
BUN: 29 mg/dL — ABNORMAL HIGH (ref 6–20)
BUN: 26 mg/dL — ABNORMAL HIGH (ref 6–20)
CALCIUM: 8.2 mg/dL — AB (ref 8.9–10.3)
CALCIUM: 8.3 mg/dL — AB (ref 8.9–10.3)
CO2: 24 mmol/L (ref 22–32)
CO2: 24 mmol/L (ref 22–32)
CREATININE: 1.27 mg/dL — AB (ref 0.44–1.00)
Chloride: 105 mmol/L (ref 101–111)
Chloride: 109 mmol/L (ref 101–111)
Creatinine, Ser: 1.25 mg/dL — ABNORMAL HIGH (ref 0.44–1.00)
GFR calc non Af Amer: 43 mL/min — ABNORMAL LOW (ref >60)
GFR, EST AFRICAN AMERICAN: 50 mL/min — AB (ref >60)
GFR, EST AFRICAN AMERICAN: 49 mL/min — AB (ref >60)
GFR, EST NON AFRICAN AMERICAN: 42 mL/min — AB (ref >60)
GLUCOSE: 147 mg/dL — AB (ref 65–99)
GLUCOSE: 150 mg/dL — AB (ref 65–99)
POTASSIUM: 3.7 mmol/L (ref 3.5–5.1)
Potassium: 3.8 mmol/L (ref 3.5–5.1)
Sodium: 138 mmol/L (ref 135–145)
Sodium: 142 mmol/L (ref 135–145)

## 2016-03-23 LAB — COOXEMETRY PANEL
CARBOXYHEMOGLOBIN: 0.9 % (ref 0.5–1.5)
METHEMOGLOBIN: 0.9 % (ref 0.0–1.5)
O2 SAT: 86.5 %
Total hemoglobin: 10.8 g/dL — ABNORMAL LOW (ref 12.0–16.0)

## 2016-03-23 LAB — CBC
HEMATOCRIT: 33.7 % — AB (ref 36.0–46.0)
HEMOGLOBIN: 10.8 g/dL — AB (ref 12.0–15.0)
MCH: 27.9 pg (ref 26.0–34.0)
MCHC: 32 g/dL (ref 30.0–36.0)
MCV: 87.1 fL (ref 78.0–100.0)
Platelets: 288 10*3/uL (ref 150–400)
RBC: 3.87 MIL/uL (ref 3.87–5.11)
RDW: 15.8 % — ABNORMAL HIGH (ref 11.5–15.5)
WBC: 6.8 10*3/uL (ref 4.0–10.5)

## 2016-03-23 LAB — HEPARIN LEVEL (UNFRACTIONATED): Heparin Unfractionated: 0.37 IU/mL (ref 0.30–0.70)

## 2016-03-23 LAB — HEPATITIS B SURFACE ANTIGEN: Hepatitis B Surface Ag: NEGATIVE

## 2016-03-23 LAB — C3 COMPLEMENT: C3 COMPLEMENT: 120 mg/dL (ref 82–167)

## 2016-03-23 LAB — MPO/PR-3 (ANCA) ANTIBODIES

## 2016-03-23 LAB — ANTINUCLEAR ANTIBODIES, IFA: ANA Ab, IFA: NEGATIVE

## 2016-03-23 LAB — ANCA TITERS

## 2016-03-23 LAB — HIV ANTIBODY (ROUTINE TESTING W REFLEX): HIV Screen 4th Generation wRfx: NONREACTIVE

## 2016-03-23 LAB — C4 COMPLEMENT: Complement C4, Body Fluid: 32 mg/dL (ref 14–44)

## 2016-03-23 LAB — HEPATITIS C ANTIBODY: HCV Ab: 0.3 s/co ratio (ref 0.0–0.9)

## 2016-03-23 LAB — ANTI-DNA ANTIBODY, DOUBLE-STRANDED: ds DNA Ab: <1 IU/mL (ref 0–9)

## 2016-03-23 MED ORDER — FUROSEMIDE 10 MG/ML IJ SOLN
80.0000 mg | Freq: Two times a day (BID) | INTRAMUSCULAR | Status: DC
  Administered 2016-03-23 – 2016-03-24 (×3): 80 mg via INTRAVENOUS
  Filled 2016-03-23 (×3): qty 8

## 2016-03-23 MED ORDER — POTASSIUM CHLORIDE CRYS ER 20 MEQ PO TBCR
40.0000 meq | EXTENDED_RELEASE_TABLET | Freq: Two times a day (BID) | ORAL | Status: DC
  Administered 2016-03-23 – 2016-03-24 (×3): 40 meq via ORAL
  Filled 2016-03-23 (×4): qty 2

## 2016-03-23 MED ORDER — POTASSIUM CHLORIDE CRYS ER 20 MEQ PO TBCR: 40.0000 meq | EXTENDED_RELEASE_TABLET | Freq: Every day | ORAL | Status: DC

## 2016-03-23 NOTE — Progress Notes (Signed)
[facility]  Active patient with [facility] prior to this readmission.  [facility] is providing HHRN and Home Infusion Pharmacy services for Heart Failure Management including Home IV Milrinone. [facility] hospital team will follow [name] to support DC plans.   If patient discharges after hours, please call [phone].   Katrina Peck 03/23/2016, 6:39 AM

## 2016-03-23 NOTE — Procedures (Signed)
After informed written consent was obtained, using Betadine for cleansing and 2% Lidocaine without epinephrine for anesthetic, with sterile technique a 4 mm punch biopsy was used to obtain a biopsy specimen of the lesion on the upper right thigh. Hemostasis was obtained by pressure and wound dressed with a band aide. Wound care instructions provided. Be alert for any signs of cutaneous infection. The specimen is labeled and sent to pathology for evaluation. The procedure was well tolerated without complications.   Mattie Marlin, [facility] Pager [phone]

## 2016-03-23 NOTE — Progress Notes (Signed)
Patient ID: Katrina Peck, female   DOB: 1945-05-13, 70 y.o.   MRN: [id number]   SUBJECTIVE: Patient on home milrinone admitted with pruritic rash diffusely on legs to thighs, right arm, and lower back.  She came to ER because she got tired of the itching.  Sounds like rash has been present for several weeks.  Coumadin and amiodarone were stopped initially. She was started on Solumedrol yesterday.  Says itching is improved and thinks rash looks a little better especially on right arm.   She also has been more short of breath over the last few weeks.  Gets dyspneic with moderate exertion.  IV Lasix started.  She diuresed reasonably yestrday.   Scheduled Meds: . feeding supplement  1 Container Oral Q24H  . furosemide  80 mg Intravenous BID  . lidocaine-EPINEPHrine  10 mL Infiltration Once  . magnesium oxide  400 mg Oral Daily  . methylPREDNISolone (SOLU-MEDROL) injection  50 mg Intravenous Q24H  . potassium chloride SA  40 mEq Oral BID  . spironolactone  12.5 mg Oral Daily   Continuous Infusions: . heparin 900 Units/hr (03/22/16 2331)  . milrinone 0.25 mcg/kg/min (03/22/16 2331)   PRN Meds:.acetaminophen **OR** acetaminophen, bacitracin, diphenhydrAMINE, lidocaine, ondansetron **OR** ondansetron (ZOFRAN) IV, oxyCODONE    Vitals:   03/22/16 2118 03/22/16 2335 03/23/16 0425 03/23/16 0500  BP: 123/73 94/69 93/73    Pulse:      Resp: (!) 25 19 15    Temp: 98 F (36.7 C)  97.6 F (36.4 C)   TempSrc: Oral  Oral   SpO2: 96% 95% 96%   Weight:    98 lb 12.8 oz (44.8 kg)  Height:        Intake/Output Summary (Last 24 hours) at 03/23/16 0800 Last data filed at 03/22/16 2335  Gross per 24 hour  Intake             1804 ml  Output             2125 ml  Net             -321 ml    LABS: Basic Metabolic Panel:  Recent Labs  03/22/16 0522 03/23/16 0508  NA 144 138  K 4.1 3.7  CL 114* 105  CO2 24 24  GLUCOSE 94 147*  BUN 26* 29*  CREATININE 1.35* 1.25*  CALCIUM 8.6* 8.2*   Liver  Function Tests:  Recent Labs  03/22/16 0522  AST 28  ALT 25  ALKPHOS 94  BILITOT 1.3*  PROT 5.9*  ALBUMIN 2.7*   No results for input(s): LIPASE, AMYLASE in the last 72 hours. CBC:  Recent Labs  03/22/16 0522 03/23/16 0508  WBC 7.0 6.8  NEUTROABS 4.5  --   HGB 10.2* 10.8*  HCT 31.9* 33.7*  MCV 85.8 87.1  PLT 278 288   Cardiac Enzymes:  Recent Labs  03/22/16 0127  TROPONINI 0.25*   BNP: Invalid input(s): POCBNP D-Dimer: No results for input(s): DDIMER in the last 72 hours. Hemoglobin A1C: No results for input(s): HGBA1C in the last 72 hours. Fasting Lipid Panel: No results for input(s): CHOL, HDL, LDLCALC, TRIG, CHOLHDL, LDLDIRECT in the last 72 hours. Thyroid Function Tests:  Recent Labs  03/22/16 0127  TSH 1.844   Anemia Panel: No results for input(s): VITAMINB12, FOLATE, FERRITIN, TIBC, IRON, RETICCTPCT in the last 72 hours.  RADIOLOGY: Dg Chest 2 View  Result Date: 03/21/2016 CLINICAL DATA:  Shortness of Breath, chest pain. EXAM: CHEST  2 VIEW COMPARISON:  01/09/2016 FINDINGS: Right Central line remains in place, unchanged. Marked cardiomegaly. No confluent airspace opacities, effusions or edema. No acute bony abnormality. IMPRESSION: Stable marked cardiomegaly.  No active disease. Electronically Signed   By: Rolm Baptise M.D.   On: 03/21/2016 18:25    PHYSICAL EXAM General: NAD Neck: JVP 10 cm, no thyromegaly or thyroid nodule.  Lungs: Clear to auscultation bilaterally with normal respiratory effort. CV: Lateral PMI.  Heart regular S1/S2 with widely split S2, no S3/S4, no murmur.  No peripheral edema.   Abdomen: Soft, nontender, no hepatosplenomegaly, no distention.  Neurologic: Alert and oriented x 3.  Psych: Normal affect. Extremities: No clubbing or cyanosis.  Skin: Diffuse erythematous macular rash involving calves/thighs, lower back, and right arm.   TELEMETRY: Reviewed telemetry pt in NSR  ASSESSMENT AND PLAN: 70 yo with history of  nonischemic cardiomyopathy (end stage on home milrinone), syncope, paroxysmal atrial fibrillation, LV thrombus, chronic LBBB presented for evaluation of rash.  Also reported increased dyspnea.  1. Rash: Diffuse, erythematous/macular and pruritic.  Involves legs, right arm, and lower back.  No new meds recently. ESR and CRP not elevated. Hopefully rash is not due to milrinone (she has been on milrinone for a long time now). No mucus membrane involvement noted.  - Coumadin stopped, but suspect unlikely coumadin skin necrosis as she has been on coumadin for a long time now and this is typically a reaction that occurs right after starting coumadin due to transient hypercoagulability.  - Amiodarone stopped, but not typical amiodarone photosensitivity or blue discoloration.   - Agree with Solumedrol empirically.  - Serologic workup sent => so far, HIV and HCV negative.  - Surgery has seen, plans for punch biopsy.  Will need dermatology evaluation but cannot get in this hospital as inpatient.  2. Acute on chronic systolic CHF: Echo in 08/14 with severely dilated LV, EF 15%.  She does have some volume overload on exam and reports increased dyspnea. Breathing better today.  - Lasix 80 mg IV bid today, replace K.  Probably back to po tomorrow.  - Continue milrinone 0.25.  - Continue spironolactone 12.5 daily.  - Will need to discuss compliance with torsemide 20 daily with her prior to deciding on her home regimen of torsemide.  - We recently discussed CRT-P upgrade with her to try to get off milrinone, she was not interested at the time.  3. H/o LV apical thrombus: Heparin gtt while off warfarin.  4. Atrial fibrillation: Paroxysmal.  Currently in NSR.  Amiodarone held for the time being while we work out cause of rash.  5. Syncope: Possibly vasovagal in past but cannot rule out VT given cardiomyopathy.  Had been on amiodarone.   Loralie Champagne 03/23/2016 8:00 AM

## 2016-03-23 NOTE — Progress Notes (Signed)
PROGRESS NOTE        PATIENT DETAILS Name: Katrina Peck Age: 70 y.o. Sex: female Date of Birth: 19-Aug-1945 Admit Date: 03/21/2016 Admitting Physician Evalee Mutton Kristeen Mans, MD TYO:MAYOK,HTXHFSFSE, MD  Brief Narrative: Patient is a 70 y.o. female with history of chronic systolic heart failure on milrinone infusion, paroxysmal atrial fibrillation, prior history of pericardial effusion, LV thrombus on anticoagulation with Coumadin presented to the hospital for evaluation of lower extremity rash and worsening shortness of breath. See below for further details.  Subjective: Thinks rash in her right arm is better. Breathing much better.   Assessment/Plan: Rash:?drug induced-cards does not think this is related to coumadin or amiodarone-for now both of these are on hold. Rash essentially unchanged with dark skin pigmentation in her bilateral lower extremities extending up to her thighs (plantar surface of foot not involved) and in her right upper extremity (palms not involved). Rash is also intensely pruritic.Complement levels are within normal limits.ESR/CRP not elevated. ANCA, DsDNA ,HIV,Hep B and Hep C Ab negative. ANA, antihistone antibody pending. No dermatology coverage here in the hospital-have asked general surgery for a punch biopsy. Continue IV Solumedrol. If rash worsens (per patient rash improving) or starts involving the mucus membranes-may need transfer to a higher level of care.See pics below  Acute on chronic systolic CHF Echo in 39/53 with severely dilated LV, EF 15%: Volume overloaded on exam, on milrinone. Diuretics defer to cardiology.  LV apical thrombus: INR subtherapeutic-on IV heparin. Continue to hold Coumadin until biopsy results are available.  Paroxysmal Atrial fibrillation:   Currently in NSR. Continue to hold amiodarone as workup underway for rash. Currently on IV heparin.   AKI: Probably hemodynamically mediated in a setting of CHF-follow for  now.  Anemia: Suspect due to chronic disease. Follow for now.  DVT Prophylaxis: Full dose anticoagulation with Heparin  Code Status: Full code   Family Communication: None at bedside  Disposition Plan: Remain inpatient-but will plan on Home health on discharge sometime next week  Antimicrobial agents: None  Procedures: None  CONSULTS:  cardiology   Gen Surgery  Time spent: 25-minutes-Greater than 50% of this time was spent in counseling, explanation of diagnosis, planning of further management, and coordination of care.  MEDICATIONS: Anti-infectives    None      Scheduled Meds: . feeding supplement  1 Container Oral Q24H  . furosemide  80 mg Intravenous BID  . lidocaine-EPINEPHrine  10 mL Infiltration Once  . magnesium oxide  400 mg Oral Daily  . methylPREDNISolone (SOLU-MEDROL) injection  50 mg Intravenous Q24H  . potassium chloride SA  40 mEq Oral BID  . spironolactone  12.5 mg Oral Daily   Continuous Infusions: . heparin 900 Units/hr (03/22/16 2331)  . milrinone 0.25 mcg/kg/min (03/22/16 2331)   PRN Meds:.acetaminophen **OR** acetaminophen, bacitracin, diphenhydrAMINE, lidocaine, ondansetron **OR** ondansetron (ZOFRAN) IV, oxyCODONE   PHYSICAL EXAM: Vital signs: Vitals:   03/23/16 0425 03/23/16 0500 03/23/16 0840 03/23/16 1142  BP: 93/73  99/65 96/63  Pulse:   86 90  Resp: 15     Temp: 97.6 F (36.4 C)  98.3 F (36.8 C) 98.2 F (36.8 C)  TempSrc: Oral  Oral Oral  SpO2: 96%  99% 100%  Weight:  44.8 kg (98 lb 12.8 oz)    Height:       Filed Weights   03/22/16 0000 03/22/16  1158 03/23/16 0500  Weight: 45 kg (99 lb 3.3 oz) 45.5 kg (100 lb 4.8 oz) 44.8 kg (98 lb 12.8 oz)   Body mass index is 18.07 kg/m.   General appearance :Awake, alert, not in any distress. Eyes:, pupils equally reactive to light and accomodation,no scleral icterus. HEENT: Atraumatic and Normocephalic Neck: supple, no JVD. No cervical lymphadenopathy. No  thyromegaly Resp:Good air entry bilaterally, no added sounds  CVS: S1 S2 regular GI: Bowel sounds present, Non tender and not distended with no gaurding, rigidity or rebound.No organomegaly Extremities: B/L Lower Ext shows trace edema Neurology:  speech clear,Non focal, sensation is grossly intact. Psychiatric: Normal judgment and insight. Alert and oriented x 3. Normal mood. Musculoskeletal:gait appears to be normal. Skin:see pics below-taken 12/29               I have personally reviewed following labs and imaging studies  LABORATORY DATA: CBC:  Recent Labs Lab 03/21/16 1629 03/22/16 0522 03/23/16 0508  WBC 9.7 7.0 6.8  NEUTROABS  --  4.5  --   HGB 11.2* 10.2* 10.8*  HCT 35.4* 31.9* 33.7*  MCV 86.3 85.8 87.1  PLT 304 278 408    Basic Metabolic Panel:  Recent Labs Lab 03/21/16 1629 03/22/16 0522 03/23/16 0508 03/23/16 0830  NA 141 144 138 142  K 5.2* 4.1 3.7 3.8  CL 110 114* 105 109  CO2 _0 GLUCOSE 94 94 147* 150*  BUN 22* 26* 29* 26*  CREATININE 1.30* 1.35* 1.25* 1.27*  CALCIUM 9.2 8.6* 8.2* 8.3*    GFR: Estimated Creatinine Clearance: 29.6 mL/min (by C-G formula based on SCr of 1.27 mg/dL (H)).  Liver Function Tests:  Recent Labs Lab 03/22/16 0522  AST 28  ALT 25  ALKPHOS 94  BILITOT 1.3*  PROT 5.9*  ALBUMIN 2.7*   No results for input(s): LIPASE, AMYLASE in the last 168 hours. No results for input(s): AMMONIA in the last 168 hours.  Coagulation Profile:  Recent Labs Lab 03/21/16 1752  INR 1.45    Cardiac Enzymes:  Recent Labs Lab 03/22/16 0127  TROPONINI 0.25*    BNP (last 3 results) No results for input(s): PROBNP in the last 8760 hours.  HbA1C: No results for input(s): HGBA1C in the last 72 hours.  CBG: No results for input(s): GLUCAP in the last 168 hours.  Lipid Profile: No results for input(s): CHOL, HDL, LDLCALC, TRIG, CHOLHDL, LDLDIRECT in the last 72 hours.  Thyroid Function Tests:  Recent  Labs  03/22/16 0127  TSH 1.844    Anemia Panel: No results for input(s): VITAMINB12, FOLATE, FERRITIN, TIBC, IRON, RETICCTPCT in the last 72 hours.  Urine analysis:    Component Value Date/Time   COLORURINE YELLOW 01/10/2016 [place] 01/10/2016 0747   LABSPEC 1.020 01/10/2016 0747   PHURINE 5.5 01/10/2016 0747   GLUCOSEU NEGATIVE 01/10/2016 0747   HGBUR NEGATIVE 01/10/2016 0747   BILIRUBINUR NEGATIVE 01/10/2016 0747   KETONESUR NEGATIVE 01/10/2016 0747   PROTEINUR NEGATIVE 01/10/2016 0747   UROBILINOGEN 1.0 07/25/2014 2239   NITRITE NEGATIVE 01/10/2016 0747   LEUKOCYTESUR TRACE (A) 01/10/2016 0747    Sepsis Labs: Lactic Acid, Venous    Component Value Date/Time   LATICACIDVEN 2.39 (HH) 01/09/2016 1536    MICROBIOLOGY: Recent Results (from the past 240 hour(s))  MRSA PCR Screening     Status: None   Collection Time: 03/22/16  7:01 PM  Result Value Ref Range Status   MRSA by PCR NEGATIVE  NEGATIVE Final    Comment:        The GeneXpert MRSA Assay (FDA approved for NASAL specimens only), is one component of a comprehensive MRSA colonization surveillance program. It is not intended to diagnose MRSA infection nor to guide or monitor treatment for MRSA infections.     RADIOLOGY STUDIES/RESULTS: Dg Chest 2 View  Result Date: 03/21/2016 CLINICAL DATA:  Shortness of Breath, chest pain. EXAM: CHEST  2 VIEW COMPARISON:  01/09/2016 FINDINGS: Right Central line remains in place, unchanged. Marked cardiomegaly. No confluent airspace opacities, effusions or edema. No acute bony abnormality. IMPRESSION: Stable marked cardiomegaly.  No active disease. Electronically Signed   By: Rolm Baptise M.D.   On: 03/21/2016 18:25     LOS: 1 day   Oren Binet, MD  [facility] Pager:[phone]  If 7PM-7AM, please contact night-coverage [email or web address] Password [facility] 03/23/2016, 12:58 PM

## 2016-03-23 NOTE — Consult Note (Signed)
   [facility] Inpatient Consult   03/23/2016  Katrina Peck 05/03/1945 [id number]  Patient was assessed for re-start of services.  Chart review reveals the patient is a 70 yo with history of nonischemic cardiomyopathy (end stage on home milrinone), syncope, paroxysmal atrial fibrillation, LV thrombus, chronic LBBB presented for evaluation of rash.  Also reported increased dyspnea. Met with the patient at the bedside to check for restart of services.  Patient states she did not feel it necessary since [facility] and on home Milrinone. She states she is often so short of breath and does not have the energy to speak on the phone.  Explained how [facility] Care Management differ from home care.  She states she would take the brochure with contact information and call if she has any needs.  Patient politely declines.  For questions, please contact:  Natividad Brood, RN BSN [facility] Liaison  [phone] business mobile phone Toll free office [phone]

## 2016-03-23 NOTE — Progress Notes (Signed)
ANTICOAGULATION CONSULT NOTE - Follow Up Consult  Pharmacy Consult for Heparin Indication: atrial fibrillation and LV thrombus  No Known Allergies  Patient Measurements: Height: 5\' 2"  (157.5 cm) Weight: 98 lb 12.8 oz (44.8 kg) IBW/kg (Calculated) : 50.1  Vital Signs: Temp: 98.3 F (36.8 C) (12/29 0840) Temp Source: Oral (12/29 0840) BP: 99/65 (12/29 0840) Pulse Rate: 86 (12/29 0840)  Labs:  Recent Labs  03/21/16 1629 03/21/16 1752 03/22/16 0127 03/22/16 0522  03/22/16 1438 03/22/16 2132 03/23/16 0508 03/23/16 0830  HGB 11.2*  --   --  10.2*  --   --   --  10.8*  --   HCT 35.4*  --   --  31.9*  --   --   --  33.7*  --   PLT 304  --   --  278  --   --   --  288  --   LABPROT  --  17.8*  --   --   --   --   --   --   --   INR  --  1.45  --   --   --   --   --   --   --   HEPARINUNFRC  --   --   --   --   < > 0.36 0.47 0.37  --   CREATININE 1.30*  --   --  1.35*  --   --   --  1.25* 1.27*  TROPONINI  --   --  0.25*  --   --   --   --   --   --   < > = values in this interval not displayed.  Estimated Creatinine Clearance: 29.6 mL/min (by C-G formula based on SCr of 1.27 mg/dL (H)).   Medications:  Heparin @ 900 units/hr  Assessment: 70yof on coumadin pta for hx afib and LV thrombus, admitted with possible coumadin skin necrosis. Coumadin held and she is being bridged with heparin. Heparin level is therapeutic at 0.37. CBC stable.  Goal of Therapy:  Heparin level 0.3-0.7 units/ml Monitor platelets by anticoagulation protocol: Yes   Plan:  1) Continue heparin at 900 units/hr 2) Daily heparin level and CBC 3) Follow up oral anticoagulation plan  Fredrik Rigger 03/23/2016,10:51 AM

## 2016-03-24 DIAGNOSIS — R739 Hyperglycemia, unspecified: Secondary | ICD-10-CM | POA: Diagnosis not present

## 2016-03-24 DIAGNOSIS — Z720 Tobacco use: Secondary | ICD-10-CM | POA: Diagnosis not present

## 2016-03-24 DIAGNOSIS — L309 Dermatitis, unspecified: Secondary | ICD-10-CM | POA: Diagnosis present

## 2016-03-24 DIAGNOSIS — I480 Paroxysmal atrial fibrillation: Secondary | ICD-10-CM | POA: Diagnosis not present

## 2016-03-24 DIAGNOSIS — I420 Dilated cardiomyopathy: Secondary | ICD-10-CM | POA: Diagnosis not present

## 2016-03-24 DIAGNOSIS — R55 Syncope and collapse: Secondary | ICD-10-CM | POA: Diagnosis not present

## 2016-03-24 DIAGNOSIS — Z7901 Long term (current) use of anticoagulants: Secondary | ICD-10-CM | POA: Diagnosis not present

## 2016-03-24 DIAGNOSIS — I471 Supraventricular tachycardia: Secondary | ICD-10-CM | POA: Diagnosis present

## 2016-03-24 DIAGNOSIS — I429 Cardiomyopathy, unspecified: Secondary | ICD-10-CM | POA: Diagnosis present

## 2016-03-24 DIAGNOSIS — I272 Pulmonary hypertension, unspecified: Secondary | ICD-10-CM | POA: Diagnosis not present

## 2016-03-24 DIAGNOSIS — Z8679 Personal history of other diseases of the circulatory system: Secondary | ICD-10-CM | POA: Diagnosis not present

## 2016-03-24 DIAGNOSIS — I513 Intracardiac thrombosis, not elsewhere classified: Secondary | ICD-10-CM | POA: Diagnosis not present

## 2016-03-24 DIAGNOSIS — Z9114 Patient's other noncompliance with medication regimen: Secondary | ICD-10-CM | POA: Diagnosis not present

## 2016-03-24 DIAGNOSIS — L308 Other specified dermatitis: Secondary | ICD-10-CM | POA: Diagnosis not present

## 2016-03-24 DIAGNOSIS — I5023 Acute on chronic systolic (congestive) heart failure: Secondary | ICD-10-CM | POA: Diagnosis not present

## 2016-03-24 DIAGNOSIS — Z9119 Patient's noncompliance with other medical treatment and regimen: Secondary | ICD-10-CM | POA: Diagnosis not present

## 2016-03-24 DIAGNOSIS — R21 Rash and other nonspecific skin eruption: Secondary | ICD-10-CM | POA: Diagnosis not present

## 2016-03-24 LAB — BASIC METABOLIC PANEL
ANION GAP: 7 (ref 5–15)
BUN: 31 mg/dL — ABNORMAL HIGH (ref 6–20)
CO2: 26 mmol/L (ref 22–32)
Calcium: 8.4 mg/dL — ABNORMAL LOW (ref 8.9–10.3)
Chloride: 104 mmol/L (ref 101–111)
Creatinine, Ser: 1.07 mg/dL — ABNORMAL HIGH (ref 0.44–1.00)
GFR, EST AFRICAN AMERICAN: 60 mL/min — AB (ref >60)
GFR, EST NON AFRICAN AMERICAN: 52 mL/min — AB (ref >60)
Glucose, Bld: 119 mg/dL — ABNORMAL HIGH (ref 65–99)
POTASSIUM: 4.5 mmol/L (ref 3.5–5.1)
SODIUM: 137 mmol/L (ref 135–145)

## 2016-03-24 LAB — CBC
HCT: 34.2 % — ABNORMAL LOW (ref 36.0–46.0)
HEMOGLOBIN: 10.8 g/dL — AB (ref 12.0–15.0)
MCH: 27.3 pg (ref 26.0–34.0)
MCHC: 31.6 g/dL (ref 30.0–36.0)
MCV: 86.4 fL (ref 78.0–100.0)
Platelets: 293 10*3/uL (ref 150–400)
RBC: 3.96 MIL/uL (ref 3.87–5.11)
RDW: 15.5 % (ref 11.5–15.5)
WBC: 8.5 10*3/uL (ref 4.0–10.5)

## 2016-03-24 LAB — COOXEMETRY PANEL
Carboxyhemoglobin: 1.4 % (ref 0.5–1.5)
METHEMOGLOBIN: 0.5 % (ref 0.0–1.5)
O2 Saturation: 72.9 %
Total hemoglobin: 11.1 g/dL — ABNORMAL LOW (ref 12.0–16.0)

## 2016-03-24 LAB — HEPARIN LEVEL (UNFRACTIONATED): Heparin Unfractionated: 0.4 IU/mL (ref 0.30–0.70)

## 2016-03-24 LAB — FERRITIN: FERRITIN: 11 ng/mL (ref 11–307)

## 2016-03-24 MED ORDER — TORSEMIDE 20 MG PO TABS: 20.0000 mg | ORAL_TABLET | Freq: Every day | ORAL | Status: DC

## 2016-03-24 MED ORDER — METHYLPREDNISOLONE SODIUM SUCC 125 MG IJ SOLR: 50.0000 mg | INTRAMUSCULAR | 0 refills | Status: DC

## 2016-03-24 MED ORDER — TRIAMCINOLONE ACETONIDE 0.025 % EX CREA
TOPICAL_CREAM | Freq: Three times a day (TID) | CUTANEOUS | Status: DC
  Filled 2016-03-24: qty 15

## 2016-03-24 MED ORDER — HEPARIN (PORCINE) IN NACL 100-0.45 UNIT/ML-% IJ SOLN: 900.0000 [IU]/h | INTRAMUSCULAR | Status: AC

## 2016-03-24 MED ORDER — POTASSIUM CHLORIDE CRYS ER 20 MEQ PO TBCR: 40.0000 meq | EXTENDED_RELEASE_TABLET | Freq: Every day | ORAL | Status: DC

## 2016-03-24 MED ORDER — POTASSIUM CHLORIDE CRYS ER 20 MEQ PO TBCR: 20.0000 meq | EXTENDED_RELEASE_TABLET | Freq: Every day | ORAL | Status: DC

## 2016-03-24 NOTE — Plan of Care (Signed)
Problem: Education: Goal: Knowledge of [facility] Education information/materials will improve Outcome: Progressing Patient aware of plan of care.  Medication education provided on medication administered thus far this shift.  Patient stated understanding.  Patient has denied pain thus far this shift.

## 2016-03-24 NOTE — Progress Notes (Signed)
ANTICOAGULATION CONSULT NOTE - Follow Up Consult  Pharmacy Consult for Heparin Indication: atrial fibrillation and LV thrombus  No Known Allergies  Patient Measurements: Height: 5\' 2"  (157.5 cm) Weight: 98 lb 6.4 oz (44.6 kg) IBW/kg (Calculated) : 50.1  Vital Signs: Temp: 97.8 F (36.6 C) (12/30 0742) Temp Source: Oral (12/30 0742) BP: 100/73 (12/30 0742) Pulse Rate: 79 (12/30 0742)  Labs:  Recent Labs  03/21/16 1752 03/22/16 0127 03/22/16 0522  03/22/16 2132 03/23/16 0508 03/23/16 0830 03/24/16 0505  HGB  --   --  10.2*  --   --  10.8*  --  10.8*  HCT  --   --  31.9*  --   --  33.7*  --  34.2*  PLT  --   --  278  --   --  288  --  293  LABPROT 17.8*  --   --   --   --   --   --   --   INR 1.45  --   --   --   --   --   --   --   HEPARINUNFRC  --   --   --   < > 0.47 0.37  --  0.40  CREATININE  --   --  1.35*  --   --  1.25* 1.27* 1.07*  TROPONINI  --  0.25*  --   --   --   --   --   --   < > = values in this interval not displayed.  Estimated Creatinine Clearance: 34.9 mL/min (by C-G formula based on SCr of 1.07 mg/dL (H)).   Medications:  Heparin @ 900 units/hr  Assessment: 70 year old female on warfarin PTA for hx afib and LV thrombus, admitted with possible warfarin skin necrosis. Warfarin held and she is being bridged with heparin.   Heparin level is therapeutic at 0.40. Hemoglobin is low/stable and platelet count within normal limits. No notes of bleeding.   Goal of Therapy:  Heparin level 0.3-0.7 units/ml Monitor platelets by anticoagulation protocol: Yes   Plan:  1) Continue heparin at 900 units/hr 2) Daily heparin level and CBC 3) Follow up skin biopsy results and plan or oral anticoagulation   Carylon Perches, PharmD Acute Care Pharmacy Resident  Pager: [phone] 03/24/2016

## 2016-03-24 NOTE — Discharge Summary (Addendum)
PATIENT DETAILS Name: Katrina Peck Age: 70 y.o. Sex: female Date of Birth: [birth year] MRN: [id number]. Admitting Physician: Jonetta Osgood, MD GYK:[id number], MD  Admit Date: 03/21/2016 Discharge date: 03/24/2016  Recommendations for Outpatient Follow-up:  1. Follow up with PCP in 1-2 weeks 2. Will need follow up at CHF clinic post discharge  3. Skin biopsy, Cryoglobulin level, Ferritin level still pending  Admitted From:  Home  Disposition: [place]: No  Equipment/Devices: None  Discharge Condition: Stable  CODE STATUS: FULL CODE  Diet recommendation:  Heart Healthy   Brief Summary: See H&P, Labs, Consult and Test reports for all details in brief,Patient is a 70 y.o. female with history of chronic systolic heart failure on milrinone infusion, paroxysmal atrial fibrillation, prior history of pericardial effusion, LV thrombus on anticoagulation with Coumadin presented to the hospital for evaluation of lower extremity rash and worsening shortness of breath. See below for further details.  Brief Hospital Course: Rash: Etiology unknown-thought to be a drug reaction to either amiodarone or coumadin.Rash is mostly dark skin pigmentation in her bilateral lower extremities extending up to her thighs (plantar surface of foot not involved) and in her right upper extremity (palms not involved). Rash is also intensely pruritic.Some minimal improvement in her right upper extremity-but some worsening of her rash in her upper thigh area bilaterally-and extending proximally. Inflammatory markers including ESR only 7. Complement levels are within normal limits as well. ANA,ANCA, DsDNA ,HIV,Hep B and Hep C Ab negative. Antihistone antibody, Cryoglobulin levels ares still pending.Skin biopsy done on 12/29-pending results.She was empirically started on IV Solumedrol on 12/28-without any significant improvement. Since rash getting somewhat worse, attempted to  d/w Dermatology at [facility] who could not provide advise and suggested that I speak with the hospitalist service, subsequently spoke with Dr Cain Saupe graciously accepted the patient as a transfer.   Acute on chronic systolic CHF Echo in 30/09 with severely dilated LV, EF 15%: Much more compensated, initially on IV Lasix-but has now been transitioned to torsemide and Aldactone. Continue milrinone infusion. Cardiology was consulted during this hospital staty  History of LV apical thrombus: INR subtherapeutic-on IV heparin. Continue to hold Coumadin until biopsy results are available.  Paroxysmal Atrial fibrillation:  Currently in NSR. Continue to hold amiodarone as workup underway for rash. Currently on IV heparin.   AKI: Probably hemodynamically mediated in a setting of CHF-follow for now.  Anemia: Suspect due to chronic disease. Follow for now.  Procedures/Studies: None  Discharge Diagnoses:  Principal Problem:   Rash Active Problems:   NICM (nonischemic cardiomyopathy) (HCC)   Acute on chronic systolic CHF (congestive heart failure) (HCC)   Paroxysmal atrial fibrillation (HCC)   Skin rash   Discharge Instructions:  Activity:  As tolerated with Full fall precautions use walker/cane & assistance as needed  Discharge Instructions    Diet - low sodium heart healthy    Complete by:  As directed    Increase activity slowly    Complete by:  As directed      Allergies as of 03/24/2016   No Known Allergies     Medication List    STOP taking these medications   amiodarone 200 MG tablet Commonly known as:  PACERONE   warfarin 2 MG tablet Commonly known as:  COUMADIN     TAKE these medications   feeding supplement Liqd Take 1 Container by mouth 2 (two) times daily between meals. What changed:  when to take this  heparin 100-0.45 UNIT/ML-% infusion Inject 900 Units/hr into the vein continuous.   magnesium oxide 400 MG tablet Commonly known as:  MAG-OX Take 1  tablet (400 mg total) by mouth daily.   methylPREDNISolone sodium succinate 125 mg/2 mL injection Commonly known as:  SOLU-MEDROL Inject 0.8 mLs (50 mg total) into the vein daily. Start taking on:  03/25/2016   milrinone 20 MG/100 ML Soln infusion Commonly known as:  PRIMACOR Inject 11.25 mcg/min into the vein continuous.   oxyCODONE 5 MG immediate release tablet Commonly known as:  Oxy IR/ROXICODONE Take 1 tablet (5 mg total) by mouth every 6 (six) hours as needed for moderate pain or severe pain.   potassium chloride SA 20 MEQ tablet Commonly known as:  K-DUR,KLOR-CON Take 1 tablet (20 mEq total) by mouth every other day. Take with torsemide.   spironolactone 25 MG tablet Commonly known as:  ALDACTONE Take 0.5 tablets (12.5 mg total) by mouth daily.   torsemide 20 MG tablet Commonly known as:  DEMADEX Take 1 tablet (20 mg total) by mouth daily as needed.      Follow-up Information    [facility] Follow up on 04/03/2016.   Specialty:  Cardiology Why:  at 1030 am for post hospital follow up. Please bring all of your medications to your visit. The code for parking is 5000. Contact information: [address] [id number] [place] [phone]         No Known Allergies  Consultations:   cardiology  Other Procedures/Studies: Dg Chest 2 View  Result Date: 03/21/2016 CLINICAL DATA:  Shortness of Breath, chest pain. EXAM: CHEST  2 VIEW COMPARISON:  01/09/2016 FINDINGS: Right Central line remains in place, unchanged. Marked cardiomegaly. No confluent airspace opacities, effusions or edema. No acute bony abnormality. IMPRESSION: Stable marked cardiomegaly.  No active disease. Electronically Signed   By: Rolm Baptise M.D.   On: 03/21/2016 18:25     TODAY-DAY OF DISCHARGE:  Subjective:   Katrina Peck today has no headache,no chest abdominal pain,no new weakness tingling or numbness, feels much  better wants to go home today.   Objective:   Blood pressure (!) 89/53, pulse 82, temperature 97.8 F (36.6 C), temperature source Oral, resp. rate 20, height _0  (1.575 m), weight 44.6 kg (98 lb 6.4 oz), SpO2 98 %.  Intake/Output Summary (Last 24 hours) at 03/24/16 1744 Last data filed at 03/24/16 1721  Gross per 24 hour  Intake           1445.8 ml  Output             2450 ml  Net          -1004.2 ml   Filed Weights   03/22/16 1158 03/23/16 0500 03/24/16 0400  Weight: 45.5 kg (100 lb 4.8 oz) 44.8 kg (98 lb 12.8 oz) 44.6 kg (98 lb 6.4 oz)    Exam: Awake Alert, Oriented *3, No new F.N deficits, Normal affect [place].AT,PERRAL Supple Neck,No JVD, No cervical lymphadenopathy appriciated.  Symmetrical Chest wall movement, Good air movement bilaterally, CTAB RRR,No Gallops,Rubs or new Murmurs, No Parasternal Heave +ve B.Sounds, Abd Soft, Non tender, No organomegaly appriciated, No rebound -guarding or rigidity. No Cyanosis, Clubbing   Skin:see pics below-taken 12/30                Skin:see pics below-taken 12/29                 Skin:see  pics below-taken 12/28:                 PERTINENT RADIOLOGIC STUDIES: Dg Chest 2 View  Result Date: 03/21/2016 CLINICAL DATA:  Shortness of Breath, chest pain. EXAM: CHEST  2 VIEW COMPARISON:  01/09/2016 FINDINGS: Right Central line remains in place, unchanged. Marked cardiomegaly. No confluent airspace opacities, effusions or edema. No acute bony abnormality. IMPRESSION: Stable marked cardiomegaly.  No active disease. Electronically Signed   By: Rolm Baptise M.D.   On: 03/21/2016 18:25     PERTINENT LAB RESULTS: CBC:  Recent Labs  03/23/16 0508 03/24/16 0505  WBC 6.8 8.5  HGB 10.8* 10.8*  HCT 33.7* 34.2*  PLT 288 293   CMET CMP     Component Value Date/Time   NA 137 03/24/2016 0505   K 4.5 03/24/2016 0505   CL 104 03/24/2016 0505   CO2 26 03/24/2016 0505    GLUCOSE 119 (H) 03/24/2016 0505   BUN 31 (H) 03/24/2016 0505   CREATININE 1.07 (H) 03/24/2016 0505   CREATININE 0.64 12/24/2013 1658   CALCIUM 8.4 (L) 03/24/2016 0505   PROT 5.9 (L) 03/22/2016 0522   ALBUMIN 2.7 (L) 03/22/2016 0522   AST 28 03/22/2016 0522   ALT 25 03/22/2016 0522   ALKPHOS 94 03/22/2016 0522   BILITOT 1.3 (H) 03/22/2016 0522   GFRNONAA 52 (L) 03/24/2016 0505   GFRAA 60 (L) 03/24/2016 0505    GFR Estimated Creatinine Clearance: 34.9 mL/min (by C-G formula based on SCr of 1.07 mg/dL (H)). No results for input(s): LIPASE, AMYLASE in the last 72 hours.  Recent Labs  03/22/16 0127  TROPONINI 0.25*   Invalid input(s): POCBNP No results for input(s): DDIMER in the last 72 hours. No results for input(s): HGBA1C in the last 72 hours. No results for input(s): CHOL, HDL, LDLCALC, TRIG, CHOLHDL, LDLDIRECT in the last 72 hours.  Recent Labs  03/22/16 0127  TSH 1.844   No results for input(s): VITAMINB12, FOLATE, FERRITIN, TIBC, IRON, RETICCTPCT in the last 72 hours. Coags:  Recent Labs  03/21/16 1752  INR 1.45   Microbiology: Recent Results (from the past 240 hour(s))  MRSA PCR Screening     Status: None   Collection Time: 03/22/16  7:01 PM  Result Value Ref Range Status   MRSA by PCR NEGATIVE NEGATIVE Final    Comment:        The GeneXpert MRSA Assay (FDA approved for NASAL specimens only), is one component of a comprehensive MRSA colonization surveillance program. It is not intended to diagnose MRSA infection nor to guide or monitor treatment for MRSA infections.     FURTHER DISCHARGE INSTRUCTIONS:  Get Medicines reviewed and adjusted: Please take all your medications with you for your next visit with your Primary MD  Laboratory/radiological data: Please request your Primary MD to go over all hospital tests and procedure/radiological results at the follow up, please ask your Primary MD to get all Hospital records sent to his/her office.  In  some cases, they will be blood work, cultures and biopsy results pending at the time of your discharge. Please request that your primary care M.D. goes through all the records of your hospital data and follows up on these results.  Also Note the following: If you experience worsening of your admission symptoms, develop shortness of breath, life threatening emergency, suicidal or homicidal thoughts you must seek medical attention immediately by calling 911 or calling your MD immediately  if symptoms less severe.  You  must read complete instructions/literature along with all the possible adverse reactions/side effects for all the Medicines you take and that have been prescribed to you. Take any new Medicines after you have completely understood and accpet all the possible adverse reactions/side effects.   Do not drive when taking Pain medications or sleeping medications (Benzodaizepines)  Do not take more than prescribed Pain, Sleep and Anxiety Medications. It is not advisable to combine anxiety,sleep and pain medications without talking with your primary care practitioner  Special Instructions: If you have smoked or chewed Tobacco  in the last 2 yrs please stop smoking, stop any regular Alcohol  and or any Recreational drug use.  Wear Seat belts while driving.  Please note: You were cared for by a hospitalist during your hospital stay. Once you are discharged, your primary care physician will handle any further medical issues. Please note that NO REFILLS for any discharge medications will be authorized once you are discharged, as it is imperative that you return to your primary care physician (or establish a relationship with a primary care physician if you do not have one) for your post hospital discharge needs so that they can reassess your need for medications and monitor your lab values.  Total Time spent coordinating discharge including counseling, education and face to face time equals 45  minutes.  SignedOren Binet 03/24/2016 5:44 PM

## 2016-03-24 NOTE — Progress Notes (Signed)
Patient ID: Katrina Peck, female   DOB: Apr 26, 1945, 70 y.o.   MRN: [id number]   Right thigh skin biopsy site is clean  Will sign off

## 2016-03-24 NOTE — Progress Notes (Addendum)
PROGRESS NOTE        PATIENT DETAILS Name: Katrina Peck Age: 70 y.o. Sex: female Date of Birth: May 02, 1945 Admit Date: 03/21/2016 Admitting Physician Evalee Mutton Kristeen Mans, MD DXA:[id number], MD  Brief Narrative: Patient is a 71 y.o. female with history of chronic systolic heart failure on milrinone infusion, paroxysmal atrial fibrillation, prior history of pericardial effusion, LV thrombus on anticoagulation with Coumadin presented to the hospital for evaluation of lower extremity rash and worsening shortness of breath. See below for further details.  Subjective: Rash in her right upper extremity seems to be somewhat better, rash in her upper thighs seems to have extended up slightly-perineum still not involved. Her breathing has improved.   Assessment/Plan: Rash: She continues to have some improvement in her right upper extremity-some mild worsening of her rash in her upper thigh area. Inflammatory markers including ESR are not elevated. Complement levels are within normal limits as well. ANA,ANCA, DsDNA ,HIV,Hep B and Hep C Ab negative. Antihistone antibody is still pending. This might be secondary to Coumadin-? Sub-dermal hemorrhage. We will continue to monitor, skin biopsy done on 12/29-pending results.Continue IV Solumedrol.Will d/w Dermatology at [facility]-as rash seems to be spreading proximally up her things towards the groin. See pics below  Acute on chronic systolic CHF Echo in 09/62 with severely dilated LV, EF 15%: Much more compensated, initially on IV Lasix-but has now been transitioned to torsemide and Aldactone. Continue milrinone. Cardiology following  LV apical thrombus: INR subtherapeutic-on IV heparin. Continue to hold Coumadin until biopsy results are available.  Paroxysmal Atrial fibrillation:   Currently in NSR. Continue to hold amiodarone as workup underway for rash. Currently on IV heparin.   AKI: Probably hemodynamically mediated in a  setting of CHF-follow for now.  Anemia: Suspect due to chronic disease. Follow for now.  DVT Prophylaxis: Full dose anticoagulation with Heparin  Code Status: Full code   Family Communication: Spouse at bedside  Disposition Plan: Remain inpatient-but will plan on Home health on discharge sometime next week  Antimicrobial agents: None  Procedures: None  CONSULTS:  cardiology   Gen Surgery  Time spent: 25-minutes-Greater than 50% of this time was spent in counseling, explanation of diagnosis, planning of further management, and coordination of care.  MEDICATIONS: Anti-infectives    None      Scheduled Meds: . feeding supplement  1 Container Oral Q24H  . lidocaine-EPINEPHrine  10 mL Infiltration Once  . magnesium oxide  400 mg Oral Daily  . methylPREDNISolone (SOLU-MEDROL) injection  50 mg Intravenous Q24H  . [START ON 03/25/2016] potassium chloride SA  20 mEq Oral Daily  . spironolactone  12.5 mg Oral Daily  . [START ON 03/25/2016] torsemide  20 mg Oral Daily  . triamcinolone   Topical TID   Continuous Infusions: . heparin 900 Units/hr (03/24/16 0446)  . milrinone 0.25 mcg/kg/min (03/24/16 0446)   PRN Meds:.acetaminophen **OR** acetaminophen, bacitracin, diphenhydrAMINE, lidocaine, ondansetron **OR** ondansetron (ZOFRAN) IV, oxyCODONE   PHYSICAL EXAM: Vital signs: Vitals:   03/24/16 0400 03/24/16 0446 03/24/16 0742 03/24/16 1117  BP: 115/76 96/74 100/73 92/70  Pulse: 89  79 77  Resp: _0 Temp: 97.9 F (36.6 C)  97.8 F (36.6 C) 97.9 F (36.6 C)  TempSrc: Oral  Oral Oral  SpO2: 99%  99% 98%  Weight: 44.6 kg (98 lb 6.4 oz)  Height:       Filed Weights   03/22/16 1158 03/23/16 0500 03/24/16 0400  Weight: 45.5 kg (100 lb 4.8 oz) 44.8 kg (98 lb 12.8 oz) 44.6 kg (98 lb 6.4 oz)   Body mass index is 18 kg/m.   General appearance :Awake, alert, not in any distress. Eyes:, pupils equally reactive to light and accomodation,no scleral  icterus. HEENT: Atraumatic and Normocephalic Neck: supple, no JVD. No cervical lymphadenopathy. No thyromegaly Resp:Good air entry bilaterally, no added sounds  CVS: S1 S2 regular GI: Bowel sounds present, Non tender and not distended with no gaurding, rigidity or rebound.No organomegaly Extremities: B/L Lower Ext shows trace edema Neurology:  speech clear,Non focal, sensation is grossly intact. Psychiatric: Normal judgment and insight. Alert and oriented x 3. Normal mood. Musculoskeletal:gait appears to be normal. Skin:see pics below-taken 12/30               I have personally reviewed following labs and imaging studies  LABORATORY DATA: CBC:  Recent Labs Lab 03/21/16 1629 03/22/16 0522 03/23/16 0508 03/24/16 0505  WBC 9.7 7.0 6.8 8.5  NEUTROABS  --  4.5  --   --   HGB 11.2* 10.2* 10.8* 10.8*  HCT 35.4* 31.9* 33.7* 34.2*  MCV 86.3 85.8 87.1 86.4  PLT 304 278 288 242    Basic Metabolic Panel:  Recent Labs Lab 03/21/16 1629 03/22/16 0522 03/23/16 0508 03/23/16 0830 03/24/16 0505  NA 141 144 138 142 137  K 5.2* 4.1 3.7 3.8 4.5  CL 110 114* 105 109 104  CO2 _0 GLUCOSE 94 94 147* 150* 119*  BUN 22* 26* 29* 26* 31*  CREATININE 1.30* 1.35* 1.25* 1.27* 1.07*  CALCIUM 9.2 8.6* 8.2* 8.3* 8.4*    GFR: Estimated Creatinine Clearance: 34.9 mL/min (by C-G formula based on SCr of 1.07 mg/dL (H)).  Liver Function Tests:  Recent Labs Lab 03/22/16 0522  AST 28  ALT 25  ALKPHOS 94  BILITOT 1.3*  PROT 5.9*  ALBUMIN 2.7*   No results for input(s): LIPASE, AMYLASE in the last 168 hours. No results for input(s): AMMONIA in the last 168 hours.  Coagulation Profile:  Recent Labs Lab 03/21/16 1752  INR 1.45    Cardiac Enzymes:  Recent Labs Lab 03/22/16 0127  TROPONINI 0.25*    BNP (last 3 results) No results for input(s): PROBNP in the last 8760 hours.  HbA1C: No results for input(s): HGBA1C in the last 72 hours.  CBG: No  results for input(s): GLUCAP in the last 168 hours.  Lipid Profile: No results for input(s): CHOL, HDL, LDLCALC, TRIG, CHOLHDL, LDLDIRECT in the last 72 hours.  Thyroid Function Tests:  Recent Labs  03/22/16 0127  TSH 1.844    Anemia Panel: No results for input(s): VITAMINB12, FOLATE, FERRITIN, TIBC, IRON, RETICCTPCT in the last 72 hours.  Urine analysis:    Component Value Date/Time   COLORURINE YELLOW 01/10/2016 [place] 01/10/2016 0747   LABSPEC 1.020 01/10/2016 0747   PHURINE 5.5 01/10/2016 0747   GLUCOSEU NEGATIVE 01/10/2016 0747   HGBUR NEGATIVE 01/10/2016 0747   BILIRUBINUR NEGATIVE 01/10/2016 0747   KETONESUR NEGATIVE 01/10/2016 0747   PROTEINUR NEGATIVE 01/10/2016 0747   UROBILINOGEN 1.0 07/25/2014 2239   NITRITE NEGATIVE 01/10/2016 0747   LEUKOCYTESUR TRACE (A) 01/10/2016 0747    Sepsis Labs: Lactic Acid, Venous    Component Value Date/Time   LATICACIDVEN 2.39 (HH) 01/09/2016 1536    MICROBIOLOGY: Recent Results (from  the past 240 hour(s))  MRSA PCR Screening     Status: None   Collection Time: 03/22/16  7:01 PM  Result Value Ref Range Status   MRSA by PCR NEGATIVE NEGATIVE Final    Comment:        The GeneXpert MRSA Assay (FDA approved for NASAL specimens only), is one component of a comprehensive MRSA colonization surveillance program. It is not intended to diagnose MRSA infection nor to guide or monitor treatment for MRSA infections.     RADIOLOGY STUDIES/RESULTS: Dg Chest 2 View  Result Date: 03/21/2016 CLINICAL DATA:  Shortness of Breath, chest pain. EXAM: CHEST  2 VIEW COMPARISON:  01/09/2016 FINDINGS: Right Central line remains in place, unchanged. Marked cardiomegaly. No confluent airspace opacities, effusions or edema. No acute bony abnormality. IMPRESSION: Stable marked cardiomegaly.  No active disease. Electronically Signed   By: Rolm Baptise M.D.   On: 03/21/2016 18:25     LOS: 2 days   Oren Binet,  MD  [facility] Pager:[phone]  If 7PM-7AM, please contact night-coverage [email or web address] Password [email or web address] 03/24/2016, 1:14 PM

## 2016-03-24 NOTE — Progress Notes (Signed)
Patient ID: Katrina Peck, female   DOB: 1945/11/22, 70 y.o.   MRN: [id number]   SUBJECTIVE: Patient on home milrinone admitted with pruritic rash diffusely on legs to thighs, right arm, and lower back.  She came to ER because she got tired of the itching.  Sounds like rash has been present for several weeks.  Coumadin and amiodarone were stopped initially. She was started on Solumedrol.  Still itching, not much change to rash though may be better on right arm.   She had skin biopsy on 12/29.   She also has been more short of breath over the last few weeks.  Gets dyspneic with moderate exertion.  IV Lasix started.  She diuresed reasonably, weight down.   Scheduled Meds: . feeding supplement  1 Container Oral Q24H  . lidocaine-EPINEPHrine  10 mL Infiltration Once  . magnesium oxide  400 mg Oral Daily  . methylPREDNISolone (SOLU-MEDROL) injection  50 mg Intravenous Q24H  . [START ON 03/25/2016] potassium chloride SA  20 mEq Oral Daily  . spironolactone  12.5 mg Oral Daily  . [START ON 03/25/2016] torsemide  20 mg Oral Daily  . triamcinolone   Topical TID   Continuous Infusions: . heparin 900 Units/hr (03/24/16 0446)  . milrinone 0.25 mcg/kg/min (03/24/16 0446)   PRN Meds:.acetaminophen **OR** acetaminophen, bacitracin, diphenhydrAMINE, lidocaine, ondansetron **OR** ondansetron (ZOFRAN) IV, oxyCODONE    Vitals:   03/24/16 0008 03/24/16 0400 03/24/16 0446 03/24/16 0742  BP: (!) 84/67 115/76 96/74 100/73  Pulse: 86 89  79  Resp: 16 16  17   Temp: 97.8 F (36.6 C) 97.9 F (36.6 C)  97.8 F (36.6 C)  TempSrc: Oral Oral  Oral  SpO2: 98% 99%  99%  Weight:  98 lb 6.4 oz (44.6 kg)    Height:        Intake/Output Summary (Last 24 hours) at 03/24/16 0936 Last data filed at 03/24/16 0272  Gross per 24 hour  Intake          2318.33 ml  Output             2250 ml  Net            68.33 ml    LABS: Basic Metabolic Panel:  Recent Labs  03/23/16 0830 03/24/16 0505  NA 142 137  K  3.8 4.5  CL 109 104  CO2 24 26  GLUCOSE 150* 119*  BUN 26* 31*  CREATININE 1.27* 1.07*  CALCIUM 8.3* 8.4*   Liver Function Tests:  Recent Labs  03/22/16 0522  AST 28  ALT 25  ALKPHOS 94  BILITOT 1.3*  PROT 5.9*  ALBUMIN 2.7*   No results for input(s): LIPASE, AMYLASE in the last 72 hours. CBC:  Recent Labs  03/22/16 0522 03/23/16 0508 03/24/16 0505  WBC 7.0 6.8 8.5  NEUTROABS 4.5  --   --   HGB 10.2* 10.8* 10.8*  HCT 31.9* 33.7* 34.2*  MCV 85.8 87.1 86.4  PLT 278 288 293   Cardiac Enzymes:  Recent Labs  03/22/16 0127  TROPONINI 0.25*   BNP: Invalid input(s): POCBNP D-Dimer: No results for input(s): DDIMER in the last 72 hours. Hemoglobin A1C: No results for input(s): HGBA1C in the last 72 hours. Fasting Lipid Panel: No results for input(s): CHOL, HDL, LDLCALC, TRIG, CHOLHDL, LDLDIRECT in the last 72 hours. Thyroid Function Tests:  Recent Labs  03/22/16 0127  TSH 1.844   Anemia Panel: No results for input(s): VITAMINB12, FOLATE, FERRITIN, TIBC, IRON, RETICCTPCT in  the last 72 hours.  RADIOLOGY: Dg Chest 2 View  Result Date: 03/21/2016 CLINICAL DATA:  Shortness of Breath, chest pain. EXAM: CHEST  2 VIEW COMPARISON:  01/09/2016 FINDINGS: Right Central line remains in place, unchanged. Marked cardiomegaly. No confluent airspace opacities, effusions or edema. No acute bony abnormality. IMPRESSION: Stable marked cardiomegaly.  No active disease. Electronically Signed   By: Rolm Baptise M.D.   On: 03/21/2016 18:25    PHYSICAL EXAM General: NAD Neck: JVP 7 cm, no thyromegaly or thyroid nodule.  Lungs: Clear to auscultation bilaterally with normal respiratory effort. CV: Lateral PMI.  Heart regular S1/S2 with widely split S2, no S3/S4, no murmur.  No peripheral edema.   Abdomen: Soft, nontender, no hepatosplenomegaly, no distention.  Neurologic: Alert and oriented x 3.  Psych: Normal affect. Extremities: No clubbing or cyanosis.  Skin: Diffuse  erythematous macular rash involving calves/thighs, lower back, and right arm.   TELEMETRY: Reviewed telemetry pt in NSR  ASSESSMENT AND PLAN: 70 yo with history of nonischemic cardiomyopathy (end stage on home milrinone), syncope, paroxysmal atrial fibrillation, LV thrombus, chronic LBBB presented for evaluation of rash.  Also reported increased dyspnea.  1. Rash: Diffuse, erythematous/macular and pruritic.  Involves legs, right arm, and lower back.  No new meds recently. ESR and CRP not elevated. ANA, ANCA, HIV, HCV negative.  Hopefully rash is not due to milrinone (she has been on milrinone for a long time now). No mucus membrane involvement noted.  - Coumadin stopped, but suspect unlikely coumadin skin necrosis as she has been on coumadin for a long time now and this is typically a reaction that occurs right after starting coumadin due to transient hypercoagulability.  - Amiodarone stopped, but not typical amiodarone photosensitivity or blue discoloration.   - Agree with Solumedrol empirically.  - Status post skin biopsy, hopefully will have results Monday.  Will need dermatology evaluation but cannot get in this hospital as inpatient.  2. Acute on chronic systolic CHF: Echo in 25/49 with severely dilated LV, EF 15%.  Volume overloaded at admission, now looks better after IV Lasix.  - Stop IV Lasix, start torsemide 20 mg daily.  She has only been taking 2-3 times a week at home.  Should take daily.   - Continue milrinone 0.25.  - Continue spironolactone 12.5 daily.   - We recently discussed CRT-P upgrade with her to try to get off milrinone, she was not interested at the time.  3. H/o LV apical thrombus: Heparin gtt while off warfarin.  4. Atrial fibrillation: Paroxysmal.  Currently in NSR.  Amiodarone held for the time being while we work out cause of rash.  5. Syncope: Possibly vasovagal in past but cannot rule out VT given cardiomyopathy.  Had been on amiodarone.   Loralie Champagne 03/24/2016 9:36 AM

## 2016-03-27 LAB — HISTONE ANTIBODIES, IGG, BLOOD: DNA-Histone: 0.6 Units (ref 0.0–0.9)

## 2016-03-30 LAB — CRYOGLOBULIN

## 2016-03-31 DIAGNOSIS — I313 Pericardial effusion (noninflammatory): Secondary | ICD-10-CM | POA: Diagnosis not present

## 2016-03-31 DIAGNOSIS — I480 Paroxysmal atrial fibrillation: Secondary | ICD-10-CM | POA: Diagnosis not present

## 2016-03-31 DIAGNOSIS — I429 Cardiomyopathy, unspecified: Secondary | ICD-10-CM | POA: Diagnosis not present

## 2016-03-31 DIAGNOSIS — I110 Hypertensive heart disease with heart failure: Secondary | ICD-10-CM | POA: Diagnosis not present

## 2016-03-31 DIAGNOSIS — I240 Acute coronary thrombosis not resulting in myocardial infarction: Secondary | ICD-10-CM | POA: Diagnosis not present

## 2016-03-31 DIAGNOSIS — I5032 Chronic diastolic (congestive) heart failure: Secondary | ICD-10-CM | POA: Diagnosis not present

## 2016-03-31 DIAGNOSIS — R791 Abnormal coagulation profile: Secondary | ICD-10-CM | POA: Diagnosis not present

## 2016-03-31 LAB — POCT INR: INR: 1.6

## 2016-04-02 ENCOUNTER — Ambulatory Visit (INDEPENDENT_AMBULATORY_CARE_PROVIDER_SITE_OTHER): Payer: Medicare Other | Admitting: Internal Medicine

## 2016-04-02 ENCOUNTER — Other Ambulatory Visit (HOSPITAL_COMMUNITY): Payer: Medicare Other

## 2016-04-02 DIAGNOSIS — I480 Paroxysmal atrial fibrillation: Secondary | ICD-10-CM

## 2016-04-03 ENCOUNTER — Ambulatory Visit (HOSPITAL_COMMUNITY)
Admission: RE | Admit: 2016-04-03 | Discharge: 2016-04-03 | Disposition: A | Discharge status: Home / Self Care | Payer: Medicare Other | Source: Ambulatory Visit | Attending: Cardiology | Admitting: Cardiology

## 2016-04-03 VITALS — BP 92/64 | HR 80 | Wt 102.4 lb

## 2016-04-03 DIAGNOSIS — Z79899 Other long term (current) drug therapy: Secondary | ICD-10-CM | POA: Diagnosis not present

## 2016-04-03 DIAGNOSIS — I428 Other cardiomyopathies: Secondary | ICD-10-CM | POA: Diagnosis not present

## 2016-04-03 DIAGNOSIS — I5022 Chronic systolic (congestive) heart failure: Secondary | ICD-10-CM | POA: Diagnosis not present

## 2016-04-03 DIAGNOSIS — Z7901 Long term (current) use of anticoagulants: Secondary | ICD-10-CM | POA: Insufficient documentation

## 2016-04-03 DIAGNOSIS — R21 Rash and other nonspecific skin eruption: Secondary | ICD-10-CM | POA: Insufficient documentation

## 2016-04-03 DIAGNOSIS — F419 Anxiety disorder, unspecified: Secondary | ICD-10-CM | POA: Diagnosis not present

## 2016-04-03 DIAGNOSIS — I480 Paroxysmal atrial fibrillation: Secondary | ICD-10-CM | POA: Insufficient documentation

## 2016-04-03 DIAGNOSIS — I110 Hypertensive heart disease with heart failure: Secondary | ICD-10-CM | POA: Diagnosis not present

## 2016-04-03 DIAGNOSIS — R55 Syncope and collapse: Secondary | ICD-10-CM | POA: Insufficient documentation

## 2016-04-03 DIAGNOSIS — I313 Pericardial effusion (noninflammatory): Secondary | ICD-10-CM | POA: Diagnosis not present

## 2016-04-03 DIAGNOSIS — R0602 Shortness of breath: Secondary | ICD-10-CM

## 2016-04-03 LAB — CBC
HEMATOCRIT: 36.8 % (ref 36.0–46.0)
HEMOGLOBIN: 11.6 g/dL — AB (ref 12.0–15.0)
MCH: 27.2 pg (ref 26.0–34.0)
MCHC: 31.5 g/dL (ref 30.0–36.0)
MCV: 86.4 fL (ref 78.0–100.0)
Platelets: 271 10*3/uL (ref 150–400)
RBC: 4.26 MIL/uL (ref 3.87–5.11)
RDW: 15.4 % (ref 11.5–15.5)
WBC: 8.7 10*3/uL (ref 4.0–10.5)

## 2016-04-03 LAB — BRAIN NATRIURETIC PEPTIDE: B NATRIURETIC PEPTIDE 5: 1157.5 pg/mL — AB (ref 0.0–100.0)

## 2016-04-03 NOTE — Patient Instructions (Addendum)
Labs today (will call for abnormal results, otherwise no news is good news)  Take your Torsemide 20 mg (1 Tab) once Daily for two days, then continue to take as needed.  Follow up in 2 weeks.  Dermatology Appointment with Dr. Reginia Naas, MD at [facility]. Address:  [address] [id number] Appt:  Friday April 06, 2016 @ 11:15. Bring ID and proof of insurance

## 2016-04-03 NOTE — Progress Notes (Signed)
CSW  referred to assist with patient's anxiety. Patient was rocking back and forth in tears from itching from leg rash. Patient's anxiety escalated due to increased itching and states need to see dermatologist. CSW assisted with calming app and relaxation techniques to reduce anxiety. Patient reports she has the calm app on her phone although left it at home. Patient shared recent experiences with anxiety and ways she "tries to calm myself". Patient appeared to reduce anxiety and became calm with less rocking back and forth during conversation. CSW reviewed methods of reducing anxiety and encouraged patient to follow up with CSW on next visit to continue support and relaxation techniques. Lasandra Beech, LCSW [phone]

## 2016-04-03 NOTE — Progress Notes (Addendum)
Patient ID: Katrina Peck, female   DOB: [birth year], 71 y.o.   MRN: [id number]    Advanced Heart Failure Clinic Note   PCP: Dr. Duanne Guess Cardiology: Dr. Janann Colonel is a 71 y.o. 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   Pt has had multiple admissions for recurrent low output HF and is now on chronic milrinone. 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     Admitted 7/17 with a syncopal episode. No prodrome. No clear diagnosis but she had stopped her amiodarone so concerns raised for VT/VF. Pt adamantly refused ICD.    Admitted 10/16 through 01/10/16 with syncope associated with blood draw at home. EP consulted for loop recorder. She refuse loop. Discharge weight 97 pounds.    Admitted with rash and A/C CHF. Biopsy performed with no clear causative factor. (Amiodarone and coumadin were both stopped as possible agents.)  Transferred to [facility] for dermatology work up.  They are to follow as outpatient. Diuresed with IV lasix. Discharge weight 98 lbs.   She returns today for post hospital follow up.  Says she has been feeling more SOB and itching very badly from her rash. Benadryl has not helped. Has been using torsemide only as needed. Taking 2-3 times a week. Very agitated about her rash. Has not been given appointment for dermatologist. Followed by [facility] for home milrinone. Back on coumadin. No BRBPR or melena on warfarin. Pt gets very anxious, and then SOB, and then calms down a feel better. Feels like hydroxyzine is only thing that helsp.   Labs  5/16: K 4.6, creatinine 1.0 => 0.81, TSH normal, LFTs normal, TSH normal, BNP 1268 7/16: K 5.3 => 5.4, creatinine 1.0 => 1.02, HCT 39.7, BNP > 4500 => 1082, SPEP negative, urine IFE negative 8/16: K 5, creatinine 0.83 11/30/14: K 4.2 Creatinine 0.79  02/09/2015: K 4.5 Creatinine 1.11 12/16: digoxin < 0.2 1/17: K  3.8, creatinine 0.96, HCT 34.9 3/17: K 3.8, creatinine 0.77 6/17: K 4.1, creatinine 0.99, HCT 30.4 7/17: K 4.1, creatinine 1.03 9/17: K 4.6, creatinine 0.96, Hgb 12.1  SH: Married, no smoking, no ETOH.   FH: No history of cardiomyopathy or sudden death.    ROS: All systems reviewed and negative except as per HPI.   PMH: 1. Chronic systolic CHF: Nonischemic cardiomyopathy.  LHC/RHC (4/16) with no significant coronary disease; mean RA 5, PA 28/11, mean PCWP 9, CI 3.39.  She is now on home milrinone.  - Echo (4/16): EF 20% with diffuse hypokinesis, moderate RV systolic dysfunction, large pericardial effusion.  No history of drug or ETOH abuse.  No family history of cardiomyopathy.    - Echo (6/16) with EF 15-20%, restrictive diastolic function, moderate MR, moderately decreased RV systolic function, PA systolic pressure 49 mmHg, moderate-large pericardial effusion without tamponade.  - Echo (7/16) with EF 10-15%, mild LV dilation, diffuse hypokinesis, restrictive diastolic dysfunction, moderate MR, moderate to severely decreased RV systolic function, moderate TR, PA systolic pressure 51 mmHg, moderate to large pericardial effusion, no change from 6/16.   - Echo (8/16) with EF 10-15%, mild to moderately reduced RV systolic function, moderate pericardial effusion (slightly smaller than prior), PASP 63 mmHg.   - Cardiac MRI (8/16) with LV EF 11%, RV EF 30%, mid-wall LGE involving the basal to mid septum (?myocarditis), moderate to large pericardial effusion with no evidence for tamponade.   -  Echo (12/16): EF 20%, LV thrombus, mild MR, PASP 66 mmHg, moderate pericardial effusion.  - Echo (6/17) with EF 10%, small-moderate pericardial effusion.  2. Pericardial effusion: Large on 4/16 echo.  Patient had pericardial window for diagnostic and therapeutic purposes.  Cytology negative for malignancy.  6/16 echo with recurrence of moderate-large pericardial effusion without tamponade, similar repeat echo in  7/16, effusion slightly smaller in 8/16 by echo and MRI.  Moderate effusion on 12/16 echo.  Small to moderate pericardial effusion on 6/17 echo.  3. Atrial fibrillation: Atrial fibrillation with RVR, converted to NSR with amiodarone.   4. HTN 5. Noncompliance 6. LV thrombus: Noted on 12/16 echo.  7. MSSA bacteremia and discitis in 3/17, suspect tunneled catheter infection.  8. Enterobacter cloacae tunneled catheter infection 9. Syncope: 7/17, no definite etiology but concerned for arrhythmia. 10. Syncope 12/2015 thought to be vasovagal   Current Outpatient Prescriptions  Medication Sig Dispense Refill  . feeding supplement (BOOST / RESOURCE BREEZE) LIQD Take 1 Container by mouth 2 (two) times daily between meals. (Patient taking differently: Take 1 Container by mouth daily. )  0  . heparin 100-0.45 UNIT/ML-% infusion Inject 900 Units/hr into the vein continuous. 250 mL   . hydrOXYzine (ATARAX/VISTARIL) 10 MG tablet Take 10 mg by mouth every 6 (six) hours as needed.    . magnesium oxide (MAG-OX) 400 MG tablet Take 1 tablet (400 mg total) by mouth daily. 90 tablet 3  . milrinone (PRIMACOR) 20 MG/100 ML SOLN infusion Inject 11.25 mcg/min into the vein continuous. 11.25 mL 0  . oxyCODONE (OXY IR/ROXICODONE) 5 MG immediate release tablet Take 1 tablet (5 mg total) by mouth every 6 (six) hours as needed for moderate pain or severe pain. 1 tablet 0  . potassium chloride SA (K-DUR,KLOR-CON) 20 MEQ tablet Take 1 tablet (20 mEq total) by mouth every other day. Take with torsemide. 18 tablet 6  . spironolactone (ALDACTONE) 25 MG tablet Take 0.5 tablets (12.5 mg total) by mouth daily. 18 tablet 6  . torsemide (DEMADEX) 20 MG tablet Take 1 tablet (20 mg total) by mouth daily as needed. 30 tablet 3  . warfarin (COUMADIN) 2 MG tablet Take 6-8 mg by mouth daily. Per coumadin clinic.     No current facility-administered medications for this encounter.    No Known Allergies  Vitals:   04/03/16 1042  BP:  92/64  Pulse: 80  SpO2: 100%  Weight: 102 lb 6.4 oz (46.4 kg)   Wt Readings from Last 3 Encounters:  04/03/16 102 lb 6.4 oz (46.4 kg)  03/24/16 98 lb 6.4 oz (44.6 kg)  01/24/16 102 lb (46.3 kg)     PHYSICAL EXAM: General: Elderly, agitated.  HEENT: Normal.  Neck: supple. JVP 7-8 cm, Carotids 2+ bilat; no bruits. No thyromegaly or nodule noted.  Cor: PMI nondisplaced. Regular. Paradoxical S2 split Lungs: Clear, normal effort.  Abdomen: soft, NT, ND, no HSM. No bruits or masses. +BS  Extremities: no cyanosis, clubbing.  No edema. Right upper chest tunneled catheter.   Diffuse  Neuro: alert & oriented x 3, cranial nerves grossly intact. moves all 4 extremities w/o difficulty. Affect pleasant.  ASSESSMENT & PLAN: 1. Chronic systolic HF: Due to nonischemic cardiomyopathy.  cMRI (8/16) showed LV EF 11%, RV EF 30%, and patchy mid-wall LGE in the basal-mid septum that may suggest prior myocarditis.  She is now back on milrinone 0.25 mcg/kg/min via tunneled catheter with no problems.  Echo (6/17) with EF 10%, LV thrombus not visualized.  -  NYHA Class III-IIIb symptoms with rash/anxiety. Mildly volume overloaded on exam.  No bendopnea.  - Change torsemide to 20 mg daily as needed.    Should take next 2 days for symptom relief.  - Continue milrinone 0.25 mcg/kg/min, we have been unable to titrate her off this. - Continue spironolactone 12.5 daily.   - No beta blocker with low output.  - She has advanced heart failure with high risk of morbidity/mortality over the next 6 months but she has great difficulty following cardiology recommendations.  She is not interested in advanced therapies or ICD. Adamantly declines.  2. PAF: Regular by exam but rate increases with anxiety - Amiodarone on hold currently with unclear reason for rash.  - LFTs and TSH stable at last visit. -  She was told to get regular eye exams while on amiodarone.  - Continue warfarin. Coumadin clinic dosing.   3. Pericardial  effusion: S/p pericardial window. Transudate, cytology negative.  Last echo in 6/17 showed small to moderate effusion.  4. LV thrombus:  - Continue coumadin.   Thrombus not visualized on most recent echo October 2017.   5. Syncope: Occurred in 7/17 and 01/09/2016.   No definite etiology but concerned for arrhythmia.   - She has refused ICD and/or Loop recorder She has refused ICD.  Continue amiodarone. 6. Rash - Dark, slightly raised pruritic rash on BLE and ULEs.  - Had punch biopsy that showed psoriaform and spongiotic tissue reaction with parakeratin on the surface and exocytosis.  - Main issue currently. Will work to expedite follow up.  7. Anxiety - Think this is highly related to Rash above. Will attempt to expedite dermatological follow up.    Labs today. No need for labs next week. Will follow up in 2 weeks.  Pt knows to call with any worsening symptoms.  She refused ED assessment this visit. She knows to go to [facility] ED if rash worsens, due to lack of Dermatology coverage at [facility].   Addendum: Pt will see [facility] on 04/06/16  Graciella Freer, PA-C  04/03/2016   Total time spent > 25 minutes, over half that spent discussing the above.

## 2016-04-03 NOTE — Addendum Note (Signed)
Encounter addended by: Marcy Siren, LCSW on: 04/03/2016  2:15 PM<BR>    Actions taken: Pend clinical note, Sign clinical note

## 2016-04-05 ENCOUNTER — Ambulatory Visit (INDEPENDENT_AMBULATORY_CARE_PROVIDER_SITE_OTHER): Payer: Medicare Other | Admitting: Cardiovascular Disease

## 2016-04-05 DIAGNOSIS — I480 Paroxysmal atrial fibrillation: Secondary | ICD-10-CM

## 2016-04-05 DIAGNOSIS — I429 Cardiomyopathy, unspecified: Secondary | ICD-10-CM | POA: Diagnosis not present

## 2016-04-05 DIAGNOSIS — I5032 Chronic diastolic (congestive) heart failure: Secondary | ICD-10-CM | POA: Diagnosis not present

## 2016-04-05 DIAGNOSIS — I313 Pericardial effusion (noninflammatory): Secondary | ICD-10-CM | POA: Diagnosis not present

## 2016-04-05 DIAGNOSIS — I110 Hypertensive heart disease with heart failure: Secondary | ICD-10-CM | POA: Diagnosis not present

## 2016-04-05 DIAGNOSIS — I240 Acute coronary thrombosis not resulting in myocardial infarction: Secondary | ICD-10-CM | POA: Diagnosis not present

## 2016-04-05 LAB — POCT INR: INR: 2

## 2016-04-06 ENCOUNTER — Other Ambulatory Visit (HOSPITAL_COMMUNITY): Payer: Self-pay | Admitting: Cardiology

## 2016-04-06 DIAGNOSIS — L209 Atopic dermatitis, unspecified: Secondary | ICD-10-CM | POA: Diagnosis not present

## 2016-04-06 DIAGNOSIS — L299 Pruritus, unspecified: Secondary | ICD-10-CM | POA: Diagnosis not present

## 2016-04-06 DIAGNOSIS — L853 Xerosis cutis: Secondary | ICD-10-CM | POA: Diagnosis not present

## 2016-04-06 DIAGNOSIS — L308 Other specified dermatitis: Secondary | ICD-10-CM | POA: Diagnosis not present

## 2016-04-12 ENCOUNTER — Inpatient Hospital Stay (HOSPITAL_COMMUNITY): Admission: RE | Admit: 2016-04-12 | Payer: Medicare Other | Source: Ambulatory Visit

## 2016-04-12 DIAGNOSIS — I5032 Chronic diastolic (congestive) heart failure: Secondary | ICD-10-CM | POA: Diagnosis not present

## 2016-04-12 DIAGNOSIS — I240 Acute coronary thrombosis not resulting in myocardial infarction: Secondary | ICD-10-CM | POA: Diagnosis not present

## 2016-04-12 DIAGNOSIS — I313 Pericardial effusion (noninflammatory): Secondary | ICD-10-CM | POA: Diagnosis not present

## 2016-04-12 DIAGNOSIS — I429 Cardiomyopathy, unspecified: Secondary | ICD-10-CM | POA: Diagnosis not present

## 2016-04-12 DIAGNOSIS — I480 Paroxysmal atrial fibrillation: Secondary | ICD-10-CM | POA: Diagnosis not present

## 2016-04-12 DIAGNOSIS — I110 Hypertensive heart disease with heart failure: Secondary | ICD-10-CM | POA: Diagnosis not present

## 2016-04-12 LAB — POCT INR: INR: 1.3

## 2016-04-13 ENCOUNTER — Ambulatory Visit (INDEPENDENT_AMBULATORY_CARE_PROVIDER_SITE_OTHER): Payer: Medicare Other | Admitting: Cardiology

## 2016-04-13 DIAGNOSIS — I480 Paroxysmal atrial fibrillation: Secondary | ICD-10-CM

## 2016-04-15 NOTE — Progress Notes (Signed)
She needs to take her torsemide every day.  She needs followup in office.

## 2016-04-18 ENCOUNTER — Encounter (HOSPITAL_COMMUNITY): Payer: Medicare Other

## 2016-04-18 DIAGNOSIS — I5022 Chronic systolic (congestive) heart failure: Secondary | ICD-10-CM | POA: Diagnosis not present

## 2016-04-19 ENCOUNTER — Ambulatory Visit (INDEPENDENT_AMBULATORY_CARE_PROVIDER_SITE_OTHER): Payer: Medicare Other | Admitting: Interventional Cardiology

## 2016-04-19 DIAGNOSIS — I240 Acute coronary thrombosis not resulting in myocardial infarction: Secondary | ICD-10-CM | POA: Diagnosis not present

## 2016-04-19 DIAGNOSIS — I5032 Chronic diastolic (congestive) heart failure: Secondary | ICD-10-CM | POA: Diagnosis not present

## 2016-04-19 DIAGNOSIS — I480 Paroxysmal atrial fibrillation: Secondary | ICD-10-CM

## 2016-04-19 DIAGNOSIS — I429 Cardiomyopathy, unspecified: Secondary | ICD-10-CM | POA: Diagnosis not present

## 2016-04-19 DIAGNOSIS — I313 Pericardial effusion (noninflammatory): Secondary | ICD-10-CM | POA: Diagnosis not present

## 2016-04-19 DIAGNOSIS — I110 Hypertensive heart disease with heart failure: Secondary | ICD-10-CM | POA: Diagnosis not present

## 2016-04-19 LAB — POCT INR: INR: 1.3

## 2016-04-19 MED ORDER — WARFARIN SODIUM 10 MG PO TABS: ORAL_TABLET | ORAL | 1 refills | Status: AC

## 2016-04-23 ENCOUNTER — Other Ambulatory Visit (HOSPITAL_COMMUNITY): Payer: Medicare Other

## 2016-04-26 DIAGNOSIS — I5023 Acute on chronic systolic (congestive) heart failure: Secondary | ICD-10-CM | POA: Diagnosis not present

## 2016-04-26 DIAGNOSIS — E43 Unspecified severe protein-calorie malnutrition: Secondary | ICD-10-CM | POA: Diagnosis not present

## 2016-04-26 DIAGNOSIS — I428 Other cardiomyopathies: Secondary | ICD-10-CM | POA: Diagnosis not present

## 2016-04-26 DIAGNOSIS — J90 Pleural effusion, not elsewhere classified: Secondary | ICD-10-CM | POA: Diagnosis not present

## 2016-04-26 DIAGNOSIS — J810 Acute pulmonary edema: Secondary | ICD-10-CM | POA: Diagnosis not present

## 2016-04-26 DIAGNOSIS — R0602 Shortness of breath: Secondary | ICD-10-CM | POA: Diagnosis not present

## 2016-04-26 DIAGNOSIS — R918 Other nonspecific abnormal finding of lung field: Secondary | ICD-10-CM | POA: Diagnosis not present

## 2016-04-26 DIAGNOSIS — I110 Hypertensive heart disease with heart failure: Secondary | ICD-10-CM | POA: Diagnosis not present

## 2016-04-26 DIAGNOSIS — Z681 Body mass index (BMI) 19 or less, adult: Secondary | ICD-10-CM | POA: Diagnosis not present

## 2016-04-26 DIAGNOSIS — I480 Paroxysmal atrial fibrillation: Secondary | ICD-10-CM | POA: Diagnosis not present

## 2016-04-26 DIAGNOSIS — Z7901 Long term (current) use of anticoagulants: Secondary | ICD-10-CM | POA: Diagnosis not present

## 2016-04-27 DIAGNOSIS — L299 Pruritus, unspecified: Secondary | ICD-10-CM | POA: Diagnosis not present

## 2016-04-27 DIAGNOSIS — R0602 Shortness of breath: Secondary | ICD-10-CM | POA: Diagnosis not present

## 2016-04-27 DIAGNOSIS — I513 Intracardiac thrombosis, not elsewhere classified: Secondary | ICD-10-CM | POA: Diagnosis present

## 2016-04-27 DIAGNOSIS — Z86718 Personal history of other venous thrombosis and embolism: Secondary | ICD-10-CM | POA: Diagnosis not present

## 2016-04-27 DIAGNOSIS — I5082 Biventricular heart failure: Secondary | ICD-10-CM | POA: Diagnosis not present

## 2016-04-27 DIAGNOSIS — Z8249 Family history of ischemic heart disease and other diseases of the circulatory system: Secondary | ICD-10-CM | POA: Diagnosis not present

## 2016-04-27 DIAGNOSIS — J810 Acute pulmonary edema: Secondary | ICD-10-CM | POA: Diagnosis not present

## 2016-04-27 DIAGNOSIS — Z7901 Long term (current) use of anticoagulants: Secondary | ICD-10-CM | POA: Diagnosis not present

## 2016-04-27 DIAGNOSIS — I493 Ventricular premature depolarization: Secondary | ICD-10-CM | POA: Diagnosis not present

## 2016-04-27 DIAGNOSIS — I340 Nonrheumatic mitral (valve) insufficiency: Secondary | ICD-10-CM | POA: Diagnosis not present

## 2016-04-27 DIAGNOSIS — L308 Other specified dermatitis: Secondary | ICD-10-CM | POA: Diagnosis present

## 2016-04-27 DIAGNOSIS — Z9114 Patient's other noncompliance with medication regimen: Secondary | ICD-10-CM | POA: Diagnosis not present

## 2016-04-27 DIAGNOSIS — I5023 Acute on chronic systolic (congestive) heart failure: Secondary | ICD-10-CM | POA: Diagnosis not present

## 2016-04-27 DIAGNOSIS — I4891 Unspecified atrial fibrillation: Secondary | ICD-10-CM | POA: Diagnosis not present

## 2016-04-27 DIAGNOSIS — I998 Other disorder of circulatory system: Secondary | ICD-10-CM | POA: Diagnosis not present

## 2016-04-27 DIAGNOSIS — E876 Hypokalemia: Secondary | ICD-10-CM | POA: Diagnosis not present

## 2016-04-27 DIAGNOSIS — I454 Nonspecific intraventricular block: Secondary | ICD-10-CM | POA: Diagnosis not present

## 2016-04-27 DIAGNOSIS — M4644 Discitis, unspecified, thoracic region: Secondary | ICD-10-CM | POA: Diagnosis present

## 2016-04-27 DIAGNOSIS — E43 Unspecified severe protein-calorie malnutrition: Secondary | ICD-10-CM | POA: Diagnosis present

## 2016-04-27 DIAGNOSIS — I420 Dilated cardiomyopathy: Secondary | ICD-10-CM | POA: Diagnosis not present

## 2016-04-27 DIAGNOSIS — I110 Hypertensive heart disease with heart failure: Secondary | ICD-10-CM | POA: Diagnosis present

## 2016-04-27 DIAGNOSIS — Z681 Body mass index (BMI) 19 or less, adult: Secondary | ICD-10-CM | POA: Diagnosis not present

## 2016-04-27 DIAGNOSIS — I428 Other cardiomyopathies: Secondary | ICD-10-CM | POA: Diagnosis present

## 2016-04-27 DIAGNOSIS — R918 Other nonspecific abnormal finding of lung field: Secondary | ICD-10-CM | POA: Diagnosis not present

## 2016-04-27 DIAGNOSIS — I5084 End stage heart failure: Secondary | ICD-10-CM | POA: Diagnosis not present

## 2016-04-27 DIAGNOSIS — R9431 Abnormal electrocardiogram [ECG] [EKG]: Secondary | ICD-10-CM | POA: Diagnosis not present

## 2016-04-27 DIAGNOSIS — I480 Paroxysmal atrial fibrillation: Secondary | ICD-10-CM | POA: Diagnosis not present

## 2016-04-27 DIAGNOSIS — J90 Pleural effusion, not elsewhere classified: Secondary | ICD-10-CM | POA: Diagnosis not present

## 2016-04-30 ENCOUNTER — Inpatient Hospital Stay (HOSPITAL_COMMUNITY): Admission: RE | Admit: 2016-04-30 | Payer: Medicare Other | Source: Ambulatory Visit

## 2016-05-02 ENCOUNTER — Telehealth: Payer: Self-pay | Admitting: Pharmacist

## 2016-05-02 NOTE — Telephone Encounter (Signed)
Spoke to [place] at [facility] and verbal order given for INR check on Monday 05/07/16 to be called to our office.

## 2016-05-02 NOTE — Telephone Encounter (Signed)
Dr. Simona Huh called to schedule INR for Monday as patient to discharge today from [facility]. Pt to have home health again. Will call [facility], home healthcare agency and request INR to be drawn Monday. She also asks about having patient come to our office for BNP and I advised to add order to discharge orders for Home Health to draw.

## 2016-05-03 ENCOUNTER — Other Ambulatory Visit (HOSPITAL_COMMUNITY): Payer: Medicare Other

## 2016-05-03 DIAGNOSIS — I240 Acute coronary thrombosis not resulting in myocardial infarction: Secondary | ICD-10-CM | POA: Diagnosis not present

## 2016-05-03 DIAGNOSIS — I480 Paroxysmal atrial fibrillation: Secondary | ICD-10-CM | POA: Diagnosis not present

## 2016-05-03 DIAGNOSIS — I429 Cardiomyopathy, unspecified: Secondary | ICD-10-CM | POA: Diagnosis not present

## 2016-05-03 DIAGNOSIS — I313 Pericardial effusion (noninflammatory): Secondary | ICD-10-CM | POA: Diagnosis not present

## 2016-05-03 DIAGNOSIS — I110 Hypertensive heart disease with heart failure: Secondary | ICD-10-CM | POA: Diagnosis not present

## 2016-05-03 DIAGNOSIS — I5032 Chronic diastolic (congestive) heart failure: Secondary | ICD-10-CM | POA: Diagnosis not present

## 2016-05-09 DIAGNOSIS — I240 Acute coronary thrombosis not resulting in myocardial infarction: Secondary | ICD-10-CM | POA: Diagnosis not present

## 2016-05-09 DIAGNOSIS — I110 Hypertensive heart disease with heart failure: Secondary | ICD-10-CM | POA: Diagnosis not present

## 2016-05-09 DIAGNOSIS — I480 Paroxysmal atrial fibrillation: Secondary | ICD-10-CM | POA: Diagnosis not present

## 2016-05-09 DIAGNOSIS — I313 Pericardial effusion (noninflammatory): Secondary | ICD-10-CM | POA: Diagnosis not present

## 2016-05-09 DIAGNOSIS — I5032 Chronic diastolic (congestive) heart failure: Secondary | ICD-10-CM | POA: Diagnosis not present

## 2016-05-09 DIAGNOSIS — I429 Cardiomyopathy, unspecified: Secondary | ICD-10-CM | POA: Diagnosis not present

## 2016-05-10 ENCOUNTER — Telehealth: Payer: Self-pay

## 2016-05-10 NOTE — Telephone Encounter (Signed)
It looks like she is not going to followup at [facility] HF clinic, they told her to go back to see [place]. Someone needs to follow her milrinone, fine with me if it is at [facility] but tell her it is important to get established there soon.

## 2016-05-10 NOTE — Telephone Encounter (Signed)
Called spoke with Lawson Fiscal, RN [facility] pt was due for INR check on 05/07/16, called [facility] on 05/08/16 they stated they would check on 05/09/16, called today 05/10/16 secondary to not receiving any INR results, Lawson Fiscal states pt is refusing care from Dr Alford Highland office and Coumadin Clinic.  Pt has a scheduled appt tomorrow 05/11/16 at 2:45pm at [facility] to establish in their Coumadin Clinic and they are working to get pt an appt and established with a cardiologist there.  Will discontinue Anticoagulation episode and forward message to Dr Shirlee Latch as Lorain Childes.

## 2016-05-11 DIAGNOSIS — Z7901 Long term (current) use of anticoagulants: Secondary | ICD-10-CM | POA: Diagnosis not present

## 2016-05-11 DIAGNOSIS — I313 Pericardial effusion (noninflammatory): Secondary | ICD-10-CM | POA: Diagnosis not present

## 2016-05-11 DIAGNOSIS — I480 Paroxysmal atrial fibrillation: Secondary | ICD-10-CM | POA: Diagnosis not present

## 2016-05-11 DIAGNOSIS — R791 Abnormal coagulation profile: Secondary | ICD-10-CM | POA: Diagnosis not present

## 2016-05-11 DIAGNOSIS — I513 Intracardiac thrombosis, not elsewhere classified: Secondary | ICD-10-CM | POA: Diagnosis not present

## 2016-05-11 DIAGNOSIS — Z5181 Encounter for therapeutic drug level monitoring: Secondary | ICD-10-CM | POA: Diagnosis not present

## 2016-05-14 ENCOUNTER — Other Ambulatory Visit (HOSPITAL_COMMUNITY): Payer: Medicare Other

## 2016-05-14 DIAGNOSIS — I313 Pericardial effusion (noninflammatory): Secondary | ICD-10-CM | POA: Diagnosis not present

## 2016-05-14 DIAGNOSIS — I272 Pulmonary hypertension, unspecified: Secondary | ICD-10-CM | POA: Diagnosis not present

## 2016-05-14 DIAGNOSIS — M4644 Discitis, unspecified, thoracic region: Secondary | ICD-10-CM | POA: Diagnosis not present

## 2016-05-14 DIAGNOSIS — E43 Unspecified severe protein-calorie malnutrition: Secondary | ICD-10-CM | POA: Diagnosis not present

## 2016-05-14 DIAGNOSIS — Z7901 Long term (current) use of anticoagulants: Secondary | ICD-10-CM | POA: Diagnosis not present

## 2016-05-14 DIAGNOSIS — I5023 Acute on chronic systolic (congestive) heart failure: Secondary | ICD-10-CM | POA: Diagnosis not present

## 2016-05-14 DIAGNOSIS — D649 Anemia, unspecified: Secondary | ICD-10-CM | POA: Diagnosis not present

## 2016-05-14 DIAGNOSIS — I513 Intracardiac thrombosis, not elsewhere classified: Secondary | ICD-10-CM | POA: Diagnosis not present

## 2016-05-14 DIAGNOSIS — I428 Other cardiomyopathies: Secondary | ICD-10-CM | POA: Diagnosis not present

## 2016-05-14 DIAGNOSIS — I480 Paroxysmal atrial fibrillation: Secondary | ICD-10-CM | POA: Diagnosis not present

## 2016-05-14 DIAGNOSIS — I429 Cardiomyopathy, unspecified: Secondary | ICD-10-CM | POA: Diagnosis not present

## 2016-05-14 DIAGNOSIS — Z5181 Encounter for therapeutic drug level monitoring: Secondary | ICD-10-CM | POA: Diagnosis not present

## 2016-05-14 DIAGNOSIS — I110 Hypertensive heart disease with heart failure: Secondary | ICD-10-CM | POA: Diagnosis not present

## 2016-05-14 DIAGNOSIS — Z452 Encounter for adjustment and management of vascular access device: Secondary | ICD-10-CM | POA: Diagnosis not present

## 2016-05-16 DIAGNOSIS — I272 Pulmonary hypertension, unspecified: Secondary | ICD-10-CM | POA: Diagnosis not present

## 2016-05-16 DIAGNOSIS — I5023 Acute on chronic systolic (congestive) heart failure: Secondary | ICD-10-CM | POA: Diagnosis not present

## 2016-05-16 DIAGNOSIS — I429 Cardiomyopathy, unspecified: Secondary | ICD-10-CM | POA: Diagnosis not present

## 2016-05-16 DIAGNOSIS — I513 Intracardiac thrombosis, not elsewhere classified: Secondary | ICD-10-CM | POA: Diagnosis not present

## 2016-05-16 DIAGNOSIS — I480 Paroxysmal atrial fibrillation: Secondary | ICD-10-CM | POA: Diagnosis not present

## 2016-05-16 DIAGNOSIS — I110 Hypertensive heart disease with heart failure: Secondary | ICD-10-CM | POA: Diagnosis not present

## 2016-05-18 ENCOUNTER — Telehealth (HOSPITAL_COMMUNITY): Payer: Self-pay | Admitting: *Deleted

## 2016-05-18 NOTE — Telephone Encounter (Signed)
There has been several phone conversations b/t our office and [facility] in past week regarding pt.  She has been very non compliant and they have been unsure if they should continue HH.  They were advised pt had appt w/us on 2/27 and we would address further planning then.

## 2016-05-18 NOTE — Telephone Encounter (Signed)
Pt's husband called the scheduling line earlier this afternoon and cancelled all of pt's upcoming appts., he stated pt will not come back here, he yelled at front office staff and hung on up on them.  I called pt's husband back to talk to him, he was very irritated and did not want to talk to our office.  I briefly explained that she will need a new cardiologist to follow her milrinone and home picc care if she does not want to come back here and ask if she had an appt with anyone else.  He states they are working on that.  I advised if pt does not have new card next week [facility] will have to stop her milrinone and d/c her picc line.  He stated he would f/u with [facility].  I called and spoke w/Melissa Stenson at [facility] let her know pt has cancelled appts with [place] and we can no longer order [facility] services, picc care and milrinone.  She states pt has orders through 2/27, they will touch base with pt then and if she does not have a new card to resume her care they will d/c milrinone and all orders.

## 2016-05-22 ENCOUNTER — Encounter (HOSPITAL_COMMUNITY): Payer: Medicare Other

## 2016-05-22 DIAGNOSIS — I872 Venous insufficiency (chronic) (peripheral): Secondary | ICD-10-CM | POA: Diagnosis not present

## 2016-05-22 DIAGNOSIS — I509 Heart failure, unspecified: Secondary | ICD-10-CM | POA: Diagnosis not present

## 2016-05-22 DIAGNOSIS — I110 Hypertensive heart disease with heart failure: Secondary | ICD-10-CM | POA: Diagnosis not present

## 2016-05-22 DIAGNOSIS — I480 Paroxysmal atrial fibrillation: Secondary | ICD-10-CM | POA: Diagnosis not present

## 2016-05-22 DIAGNOSIS — L299 Pruritus, unspecified: Secondary | ICD-10-CM | POA: Diagnosis not present

## 2016-05-23 DIAGNOSIS — I5023 Acute on chronic systolic (congestive) heart failure: Secondary | ICD-10-CM | POA: Diagnosis not present

## 2016-05-23 DIAGNOSIS — I272 Pulmonary hypertension, unspecified: Secondary | ICD-10-CM | POA: Diagnosis not present

## 2016-05-23 DIAGNOSIS — I110 Hypertensive heart disease with heart failure: Secondary | ICD-10-CM | POA: Diagnosis not present

## 2016-05-23 DIAGNOSIS — I480 Paroxysmal atrial fibrillation: Secondary | ICD-10-CM | POA: Diagnosis not present

## 2016-05-23 DIAGNOSIS — I513 Intracardiac thrombosis, not elsewhere classified: Secondary | ICD-10-CM | POA: Diagnosis not present

## 2016-05-23 DIAGNOSIS — I429 Cardiomyopathy, unspecified: Secondary | ICD-10-CM | POA: Diagnosis not present

## 2016-05-24 ENCOUNTER — Other Ambulatory Visit (HOSPITAL_COMMUNITY): Payer: Medicare Other

## 2016-05-24 DIAGNOSIS — I469 Cardiac arrest, cause unspecified: Secondary | ICD-10-CM | POA: Diagnosis not present

## 2016-06-04 ENCOUNTER — Other Ambulatory Visit (HOSPITAL_COMMUNITY): Payer: Medicare Other

## 2016-06-07 ENCOUNTER — Other Ambulatory Visit (HOSPITAL_COMMUNITY): Payer: Medicare Other

## 2016-06-24 DIAGNOSIS — 419620001 Death: Secondary | SNOMED CT | POA: Diagnosis not present

## 2016-06-24 DEATH — deceased

## 2016-06-25 ENCOUNTER — Other Ambulatory Visit (HOSPITAL_COMMUNITY): Payer: Medicare Other

## 2016-06-28 ENCOUNTER — Other Ambulatory Visit (HOSPITAL_COMMUNITY): Payer: Medicare Other

## 2016-07-16 ENCOUNTER — Other Ambulatory Visit (HOSPITAL_COMMUNITY): Payer: Medicare Other

## 2016-07-19 ENCOUNTER — Other Ambulatory Visit (HOSPITAL_COMMUNITY): Payer: Medicare Other

## 2016-08-09 ENCOUNTER — Other Ambulatory Visit (HOSPITAL_COMMUNITY): Payer: Medicare Other

## 2016-12-24 DEATH — deceased

## 2017-10-26 IMAGING — CR DG CHEST 2V
2 series · 2 of 2 positions shown · non-contrast
Comparison: November 11, 2014

CLINICAL DATA: Shortness of breath for 1 month

EXAM:
CHEST  2 VIEW

[w chest pa]
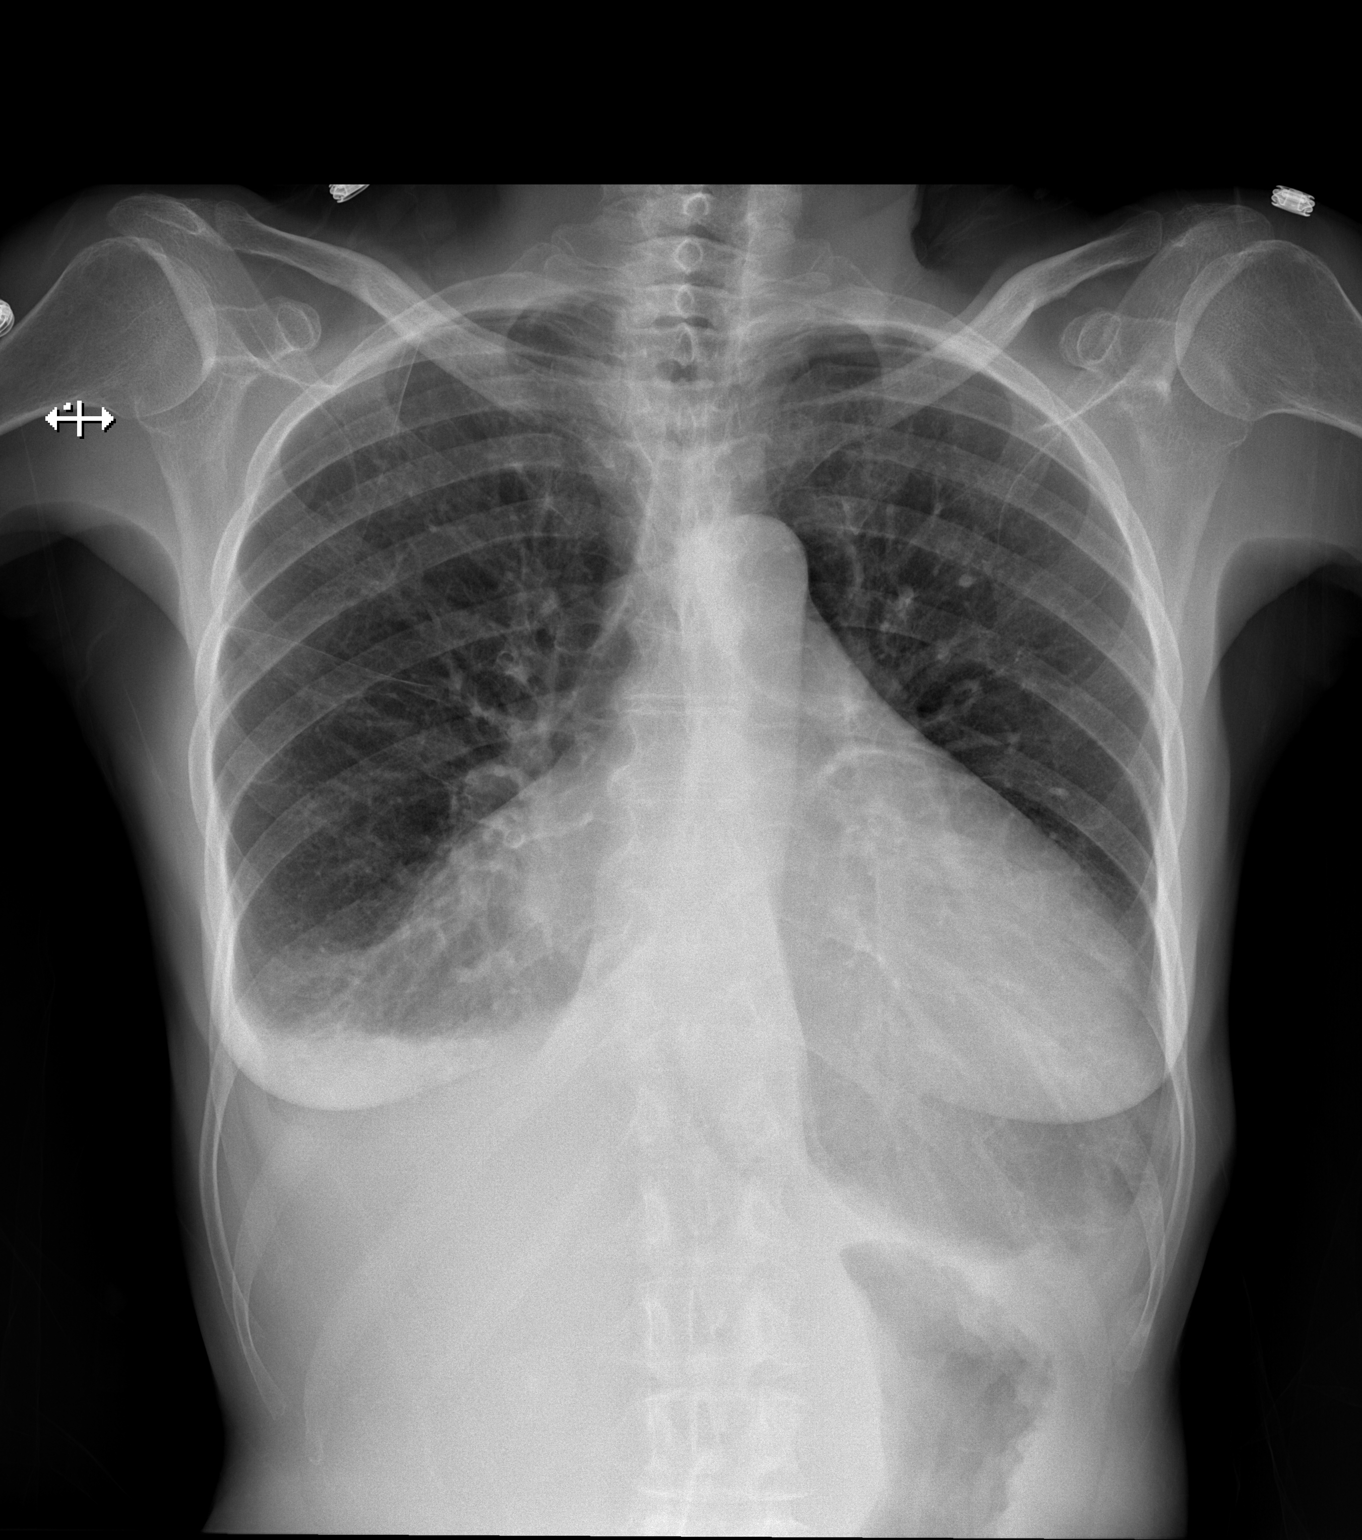

[w chest lat]
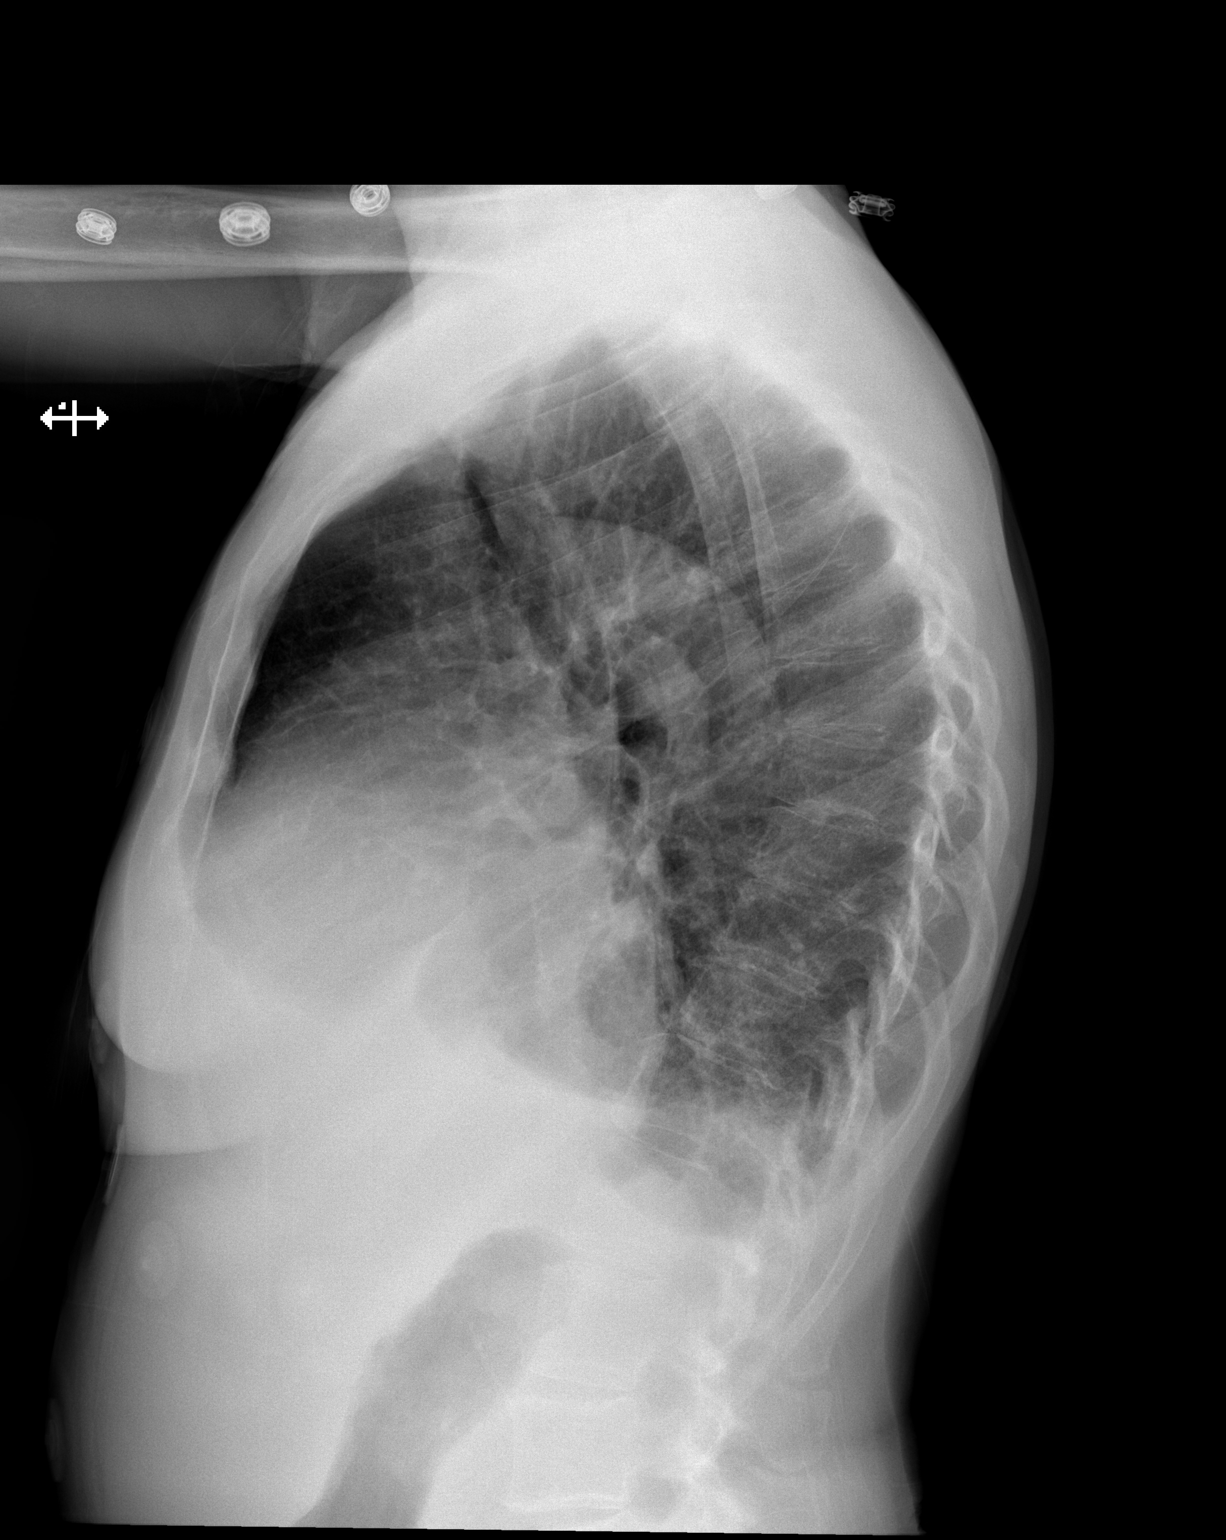

[2 of 2 positions shown; findings below may reference images not displayed]

FINDINGS: The mediastinal contour is normal. There is marked cardiomegaly.
There is a small left pleural effusion and small mole to moderate
right pleural effusion. There is no pulmonary edema or focal
pneumonia. The visualized skeletal structures are stable.
IMPRESSION: Small left pleural effusion, small to moderate right pleural
effusion. No pulmonary edema.

Marked cardiomegaly.

## 2018-03-01 IMAGING — CT CT CHEST W/O CM
2 of 4 series · 15 of 36 positions shown, 18 images · non-contrast
Comparison: 06/04/2015, 07/24/2014

CLINICAL DATA: Lung nodule. Hx HTN, CHF, NICM, and pericardial
effusion.

EXAM:
CT CHEST WITHOUT CONTRAST
TECHNIQUE: Multidetector CT imaging of the chest was performed following the
standard protocol without IV contrast.

[Series 2: thorax 5.0 i31f 1 · axial · 0.75mm/px · z∈[-606,-341]mm · 12 of 63 slices shown, 15 images]
[im 5/63  mediastinal]
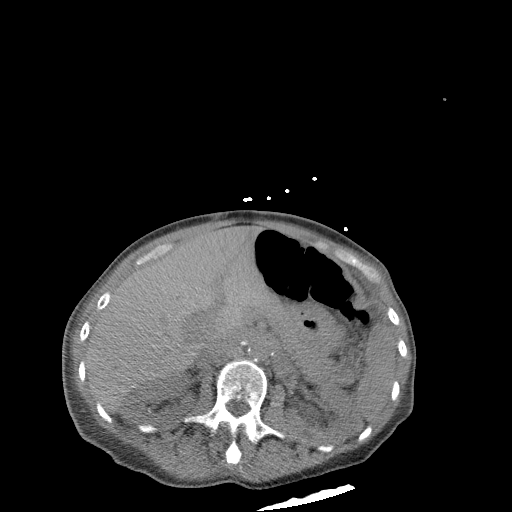
[im 5/63  lung]
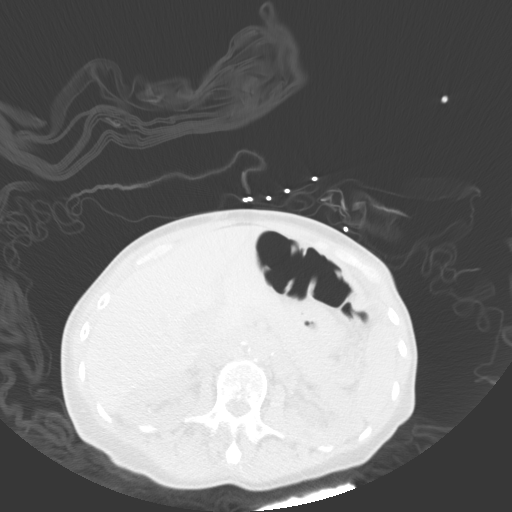
[im 9/63  lung]
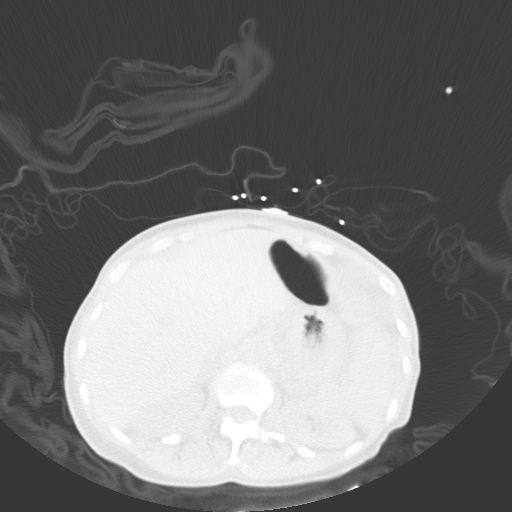
[im 14/63  lung]
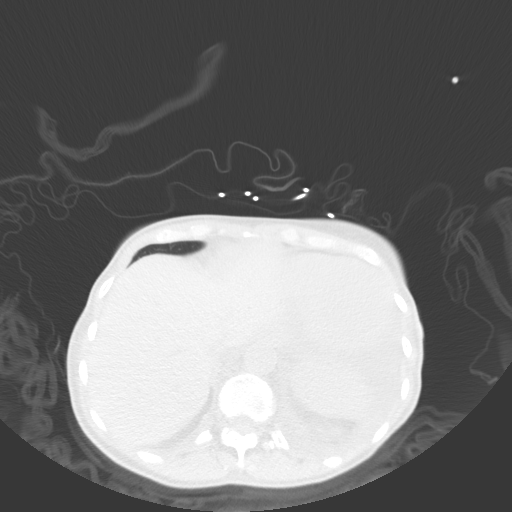
[im 18/63  lung]
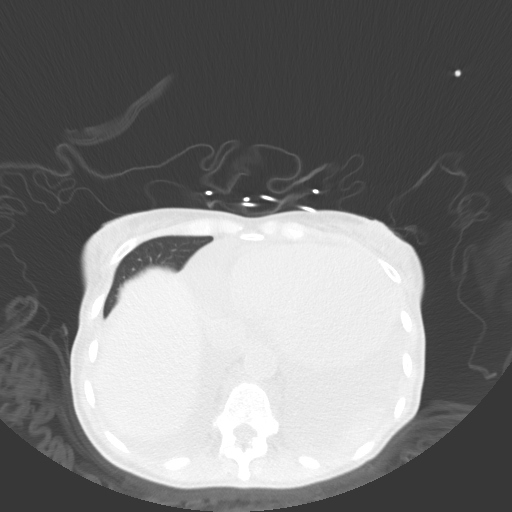
[im 23/63  mediastinal]
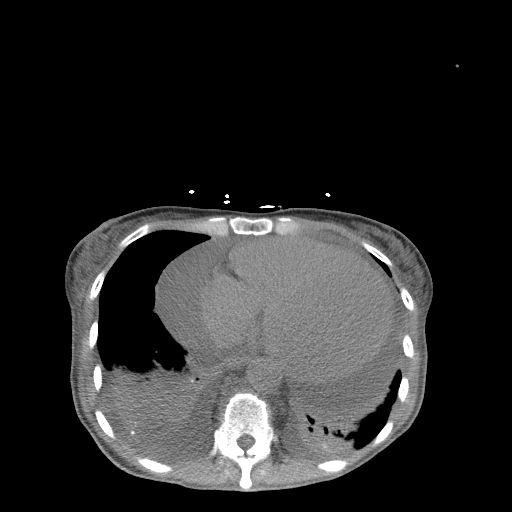
[im 23/63  lung]
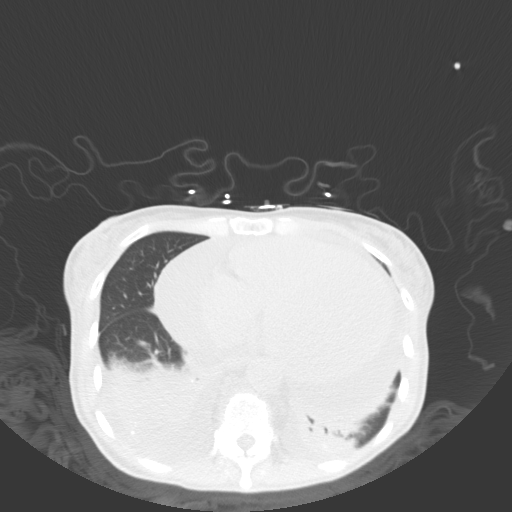
[im 27/63  lung]
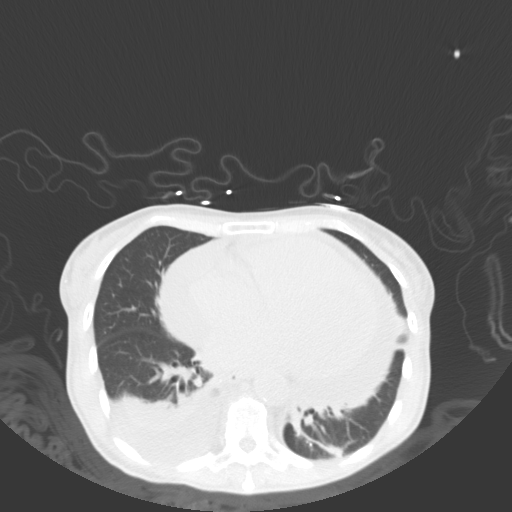
[im 36/63  lung]
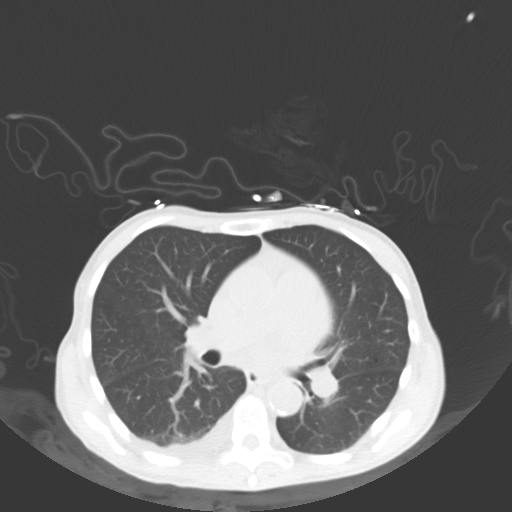
[im 40/63  lung]
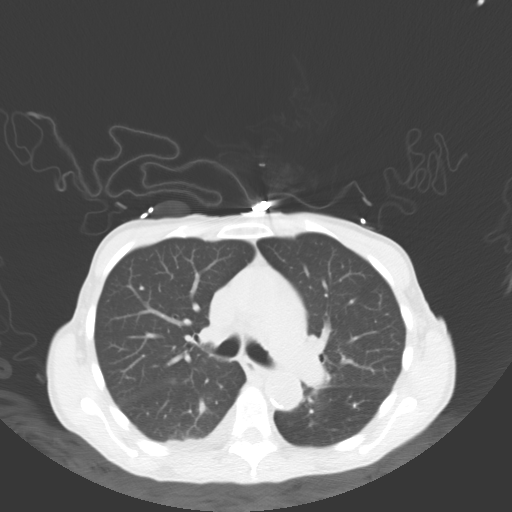
[im 45/63  mediastinal]
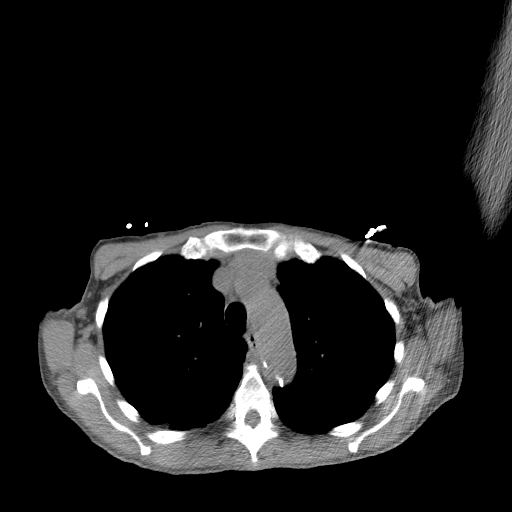
[im 45/63  lung]
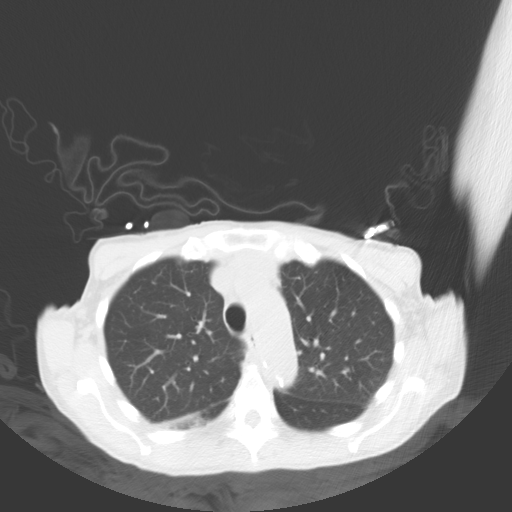
[im 49/63  lung]
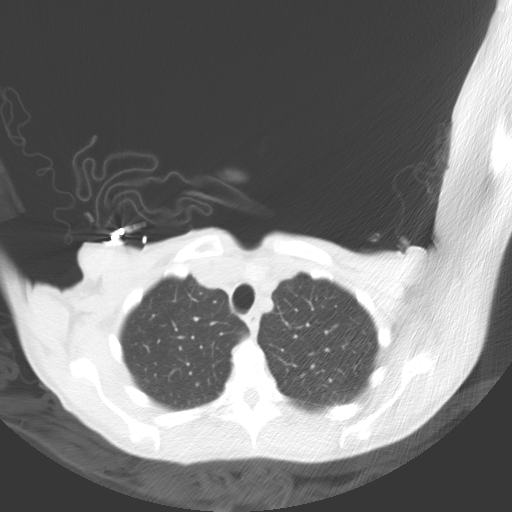
[im 54/63  lung]
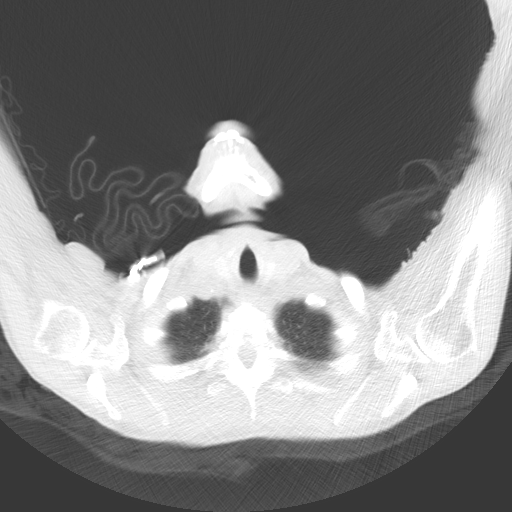
[im 58/63  lung]
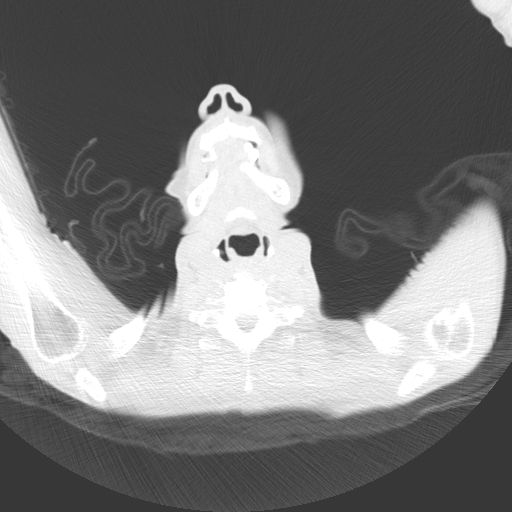

[Series 5: coronal · coronal · 0.53mm/px · 3 of 66 slices shown]
[im 14/66  lung]
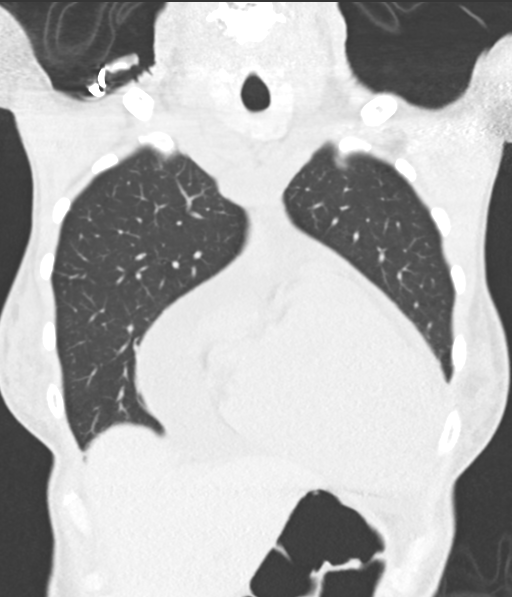
[im 27/66  lung]
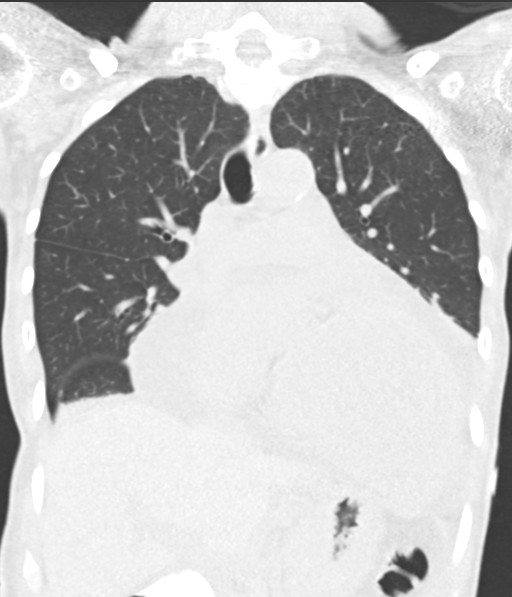
[im 40/66  lung]
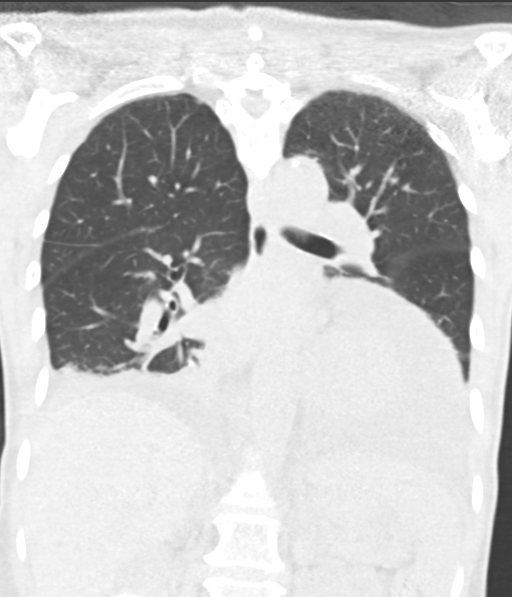

[15 of 36 positions shown; findings below may reference images not displayed]

FINDINGS: Heart: There is a large pericardial effusion, measuring 3.5 cm
adjacent to the right atrium. Aortic valve calcification. No
significant coronary artery calcification.

Vascular structures: There is mild atherosclerotic calcification of
the thoracic aorta. No associated aneurysm. Pulmonary arteries have
a normal noncontrast appearance.

Mediastinum/thyroid: The visualized portion of the thyroid gland has
a normal appearance. No mediastinal, hilar, or axillary adenopathy.

Lungs/Airways: There are bilateral pleural effusions, right greater
than left. Associated bilateral lower lobe infiltrates are also
noted, right greater than left. Bibasilar calcifications are noted
in the lung parenchyma, of indeterminate significance and stable
since prior study.

Adjacent to the left major fissure there is a small opacity which
measures 8 x 5 mm and contains a central lucency.

Upper abdomen: There are parenchymal calcifications within the
kidneys bilaterally.

Chest wall/osseous structures: Midthoracic spondylosis.
IMPRESSION: 1. Large pericardial effusion.
2. Small nodule within the left major fissure, with diameter of 7
mm. This is favored be benign, possibly related to pleural effusion,
a pseudo tumor. Follow-up CT of the chest is recommended in 6 months
to document resolution.
3. Small bilateral pleural effusions, right greater than left.
4. Right greater than left lower lobe consolidation.
